# Patient Record
Sex: Female | Born: 1975 | Race: Black or African American | Hispanic: No | Marital: Married | Smoking: Current every day smoker
Health system: Southern US, Community
[De-identification: ages and names within clinical notes are randomized; demographics above are authoritative.]

## PROBLEM LIST (undated history)

## (undated) ENCOUNTER — Inpatient Hospital Stay (HOSPITAL_COMMUNITY): Payer: Self-pay

## (undated) DIAGNOSIS — G5603 Carpal tunnel syndrome, bilateral upper limbs: Secondary | ICD-10-CM

## (undated) DIAGNOSIS — F410 Panic disorder [episodic paroxysmal anxiety] without agoraphobia: Secondary | ICD-10-CM

## (undated) DIAGNOSIS — I10 Essential (primary) hypertension: Secondary | ICD-10-CM

## (undated) DIAGNOSIS — K219 Gastro-esophageal reflux disease without esophagitis: Secondary | ICD-10-CM

## (undated) DIAGNOSIS — R609 Edema, unspecified: Secondary | ICD-10-CM

## (undated) DIAGNOSIS — IMO0001 Reserved for inherently not codable concepts without codable children: Secondary | ICD-10-CM

## (undated) DIAGNOSIS — F32A Depression, unspecified: Secondary | ICD-10-CM

## (undated) DIAGNOSIS — E7800 Pure hypercholesterolemia, unspecified: Secondary | ICD-10-CM

## (undated) DIAGNOSIS — E559 Vitamin D deficiency, unspecified: Secondary | ICD-10-CM

## (undated) DIAGNOSIS — I639 Cerebral infarction, unspecified: Secondary | ICD-10-CM

## (undated) DIAGNOSIS — O24419 Gestational diabetes mellitus in pregnancy, unspecified control: Secondary | ICD-10-CM

## (undated) DIAGNOSIS — N979 Female infertility, unspecified: Secondary | ICD-10-CM

## (undated) DIAGNOSIS — F419 Anxiety disorder, unspecified: Secondary | ICD-10-CM

## (undated) DIAGNOSIS — E739 Lactose intolerance, unspecified: Secondary | ICD-10-CM

## (undated) DIAGNOSIS — K5900 Constipation, unspecified: Secondary | ICD-10-CM

## (undated) DIAGNOSIS — J40 Bronchitis, not specified as acute or chronic: Secondary | ICD-10-CM

## (undated) DIAGNOSIS — R002 Palpitations: Secondary | ICD-10-CM

## (undated) DIAGNOSIS — F329 Major depressive disorder, single episode, unspecified: Secondary | ICD-10-CM

## (undated) DIAGNOSIS — Z8489 Family history of other specified conditions: Secondary | ICD-10-CM

## (undated) DIAGNOSIS — R519 Headache, unspecified: Secondary | ICD-10-CM

## (undated) DIAGNOSIS — O4100X0 Oligohydramnios, unspecified trimester, not applicable or unspecified: Secondary | ICD-10-CM

## (undated) DIAGNOSIS — R51 Headache: Secondary | ICD-10-CM

## (undated) HISTORY — DX: Pure hypercholesterolemia, unspecified: E78.00

## (undated) HISTORY — PX: BREAST BIOPSY: SHX20

## (undated) HISTORY — DX: Edema, unspecified: R60.9

## (undated) HISTORY — DX: Lactose intolerance, unspecified: E73.9

## (undated) HISTORY — DX: Gestational diabetes mellitus in pregnancy, unspecified control: O24.419

## (undated) HISTORY — DX: Constipation, unspecified: K59.00

## (undated) HISTORY — PX: BREAST EXCISIONAL BIOPSY: SUR124

## (undated) HISTORY — DX: Palpitations: R00.2

## (undated) HISTORY — DX: Female infertility, unspecified: N97.9

## (undated) HISTORY — DX: Gastro-esophageal reflux disease without esophagitis: K21.9

## (undated) HISTORY — DX: Vitamin D deficiency, unspecified: E55.9

---

## 2003-05-20 ENCOUNTER — Emergency Department (HOSPITAL_COMMUNITY): Admission: EM | Admit: 2003-05-20 | Discharge: 2003-05-20 | Payer: Self-pay | Admitting: Emergency Medicine

## 2004-02-23 ENCOUNTER — Emergency Department (HOSPITAL_COMMUNITY): Admission: EM | Admit: 2004-02-23 | Discharge: 2004-02-23 | Payer: Self-pay | Admitting: Emergency Medicine

## 2004-10-08 ENCOUNTER — Emergency Department (HOSPITAL_COMMUNITY): Admission: EM | Admit: 2004-10-08 | Discharge: 2004-10-08 | Payer: Self-pay | Admitting: Emergency Medicine

## 2005-01-11 ENCOUNTER — Inpatient Hospital Stay (HOSPITAL_COMMUNITY): Admission: AD | Admit: 2005-01-11 | Discharge: 2005-01-11 | Payer: Self-pay | Admitting: Obstetrics & Gynecology

## 2005-02-05 ENCOUNTER — Inpatient Hospital Stay (HOSPITAL_COMMUNITY): Admission: AD | Admit: 2005-02-05 | Discharge: 2005-02-05 | Payer: Self-pay | Admitting: Obstetrics & Gynecology

## 2005-03-20 ENCOUNTER — Inpatient Hospital Stay (HOSPITAL_COMMUNITY): Admission: AD | Admit: 2005-03-20 | Discharge: 2005-03-20 | Payer: Self-pay | Admitting: Obstetrics

## 2005-03-20 IMAGING — US US FETAL BPP W/O NONSTRESS
1 series · 14 of 25 positions shown · non-contrast
Comparison: none

CLINICAL DATA: 39 weeks pregnant with contractions and variable decelerations.

[Series 1: us fetal bpp w/o nonstress · 0.33mm/px · 25 acquisitions, 14 frames shown]
[im 1/25]
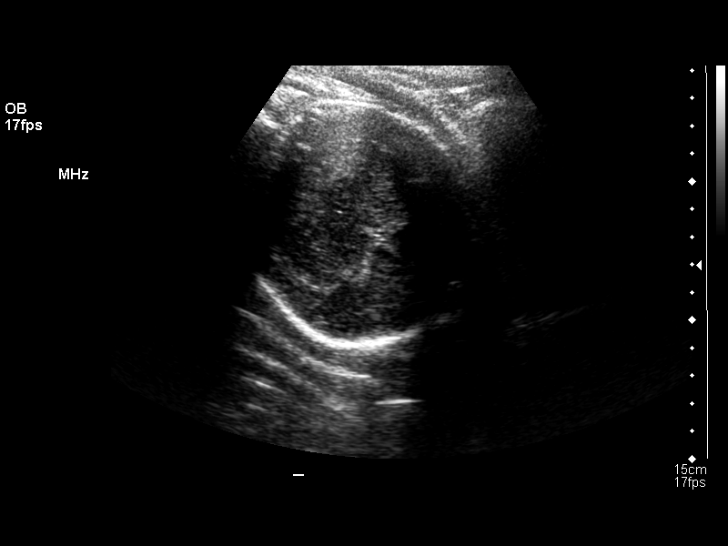
[im 3/25]
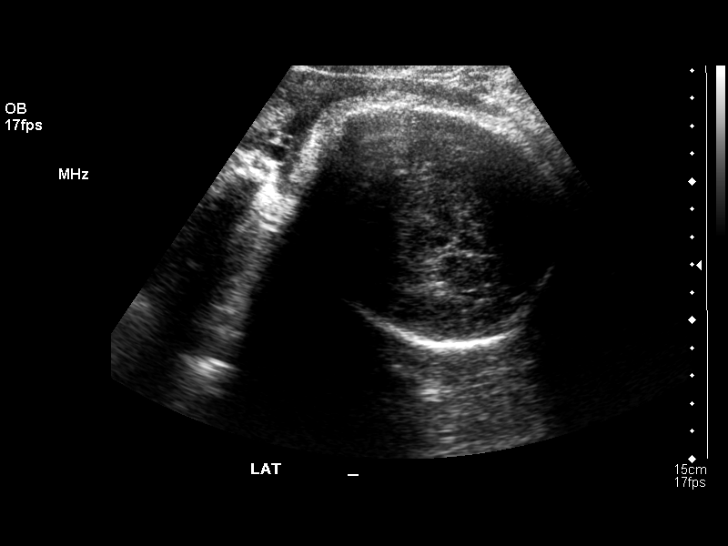
[im 5/25]
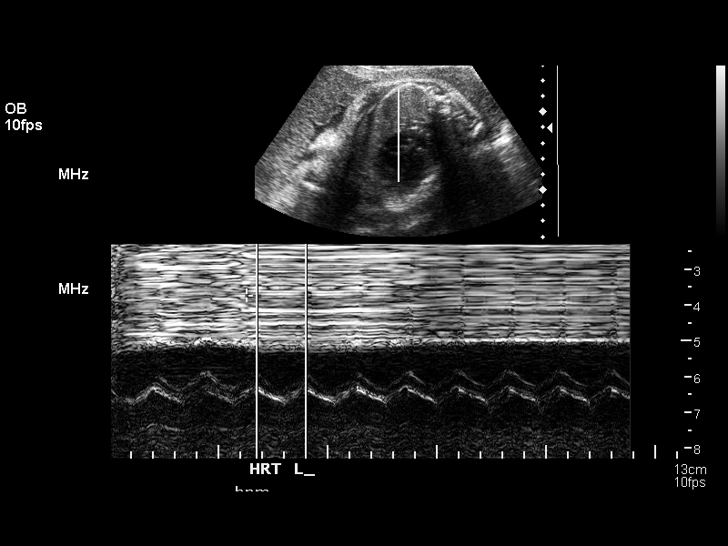
[im 7/25]
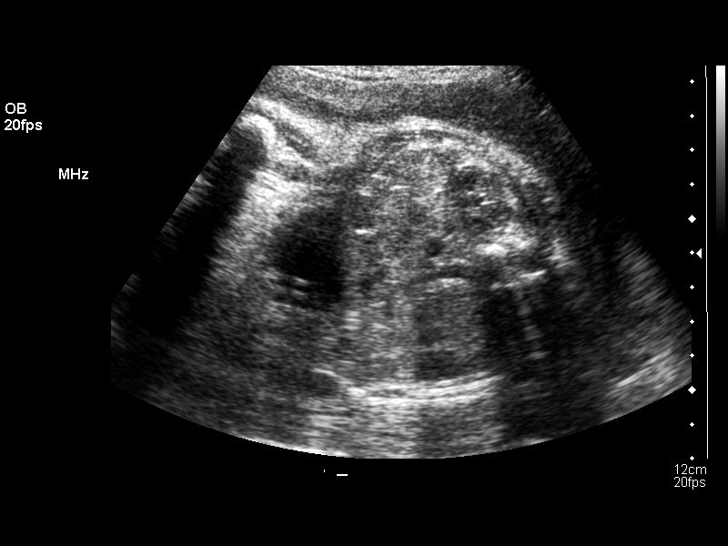
[im 9/25]
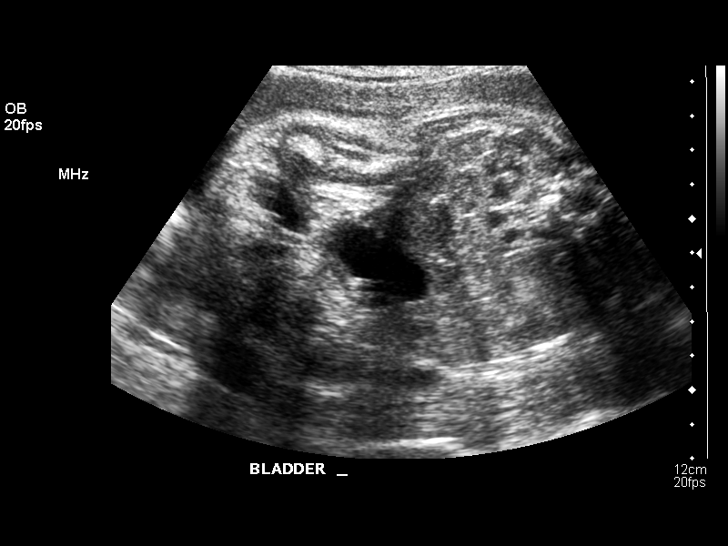
[im 10/25]
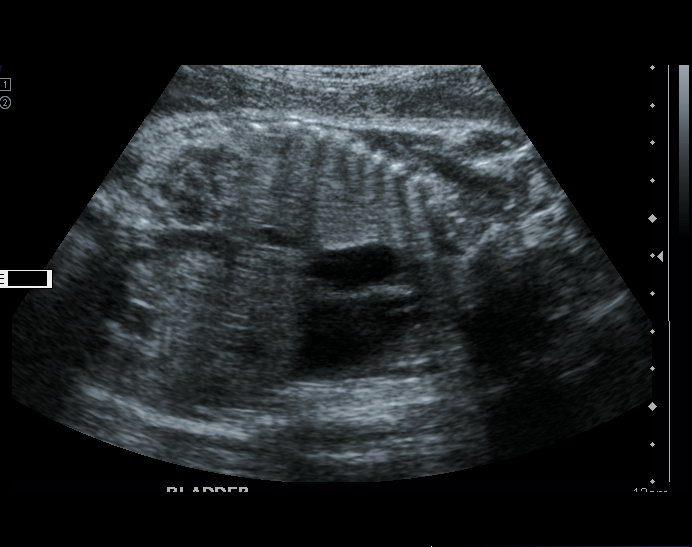
[im 12/25]
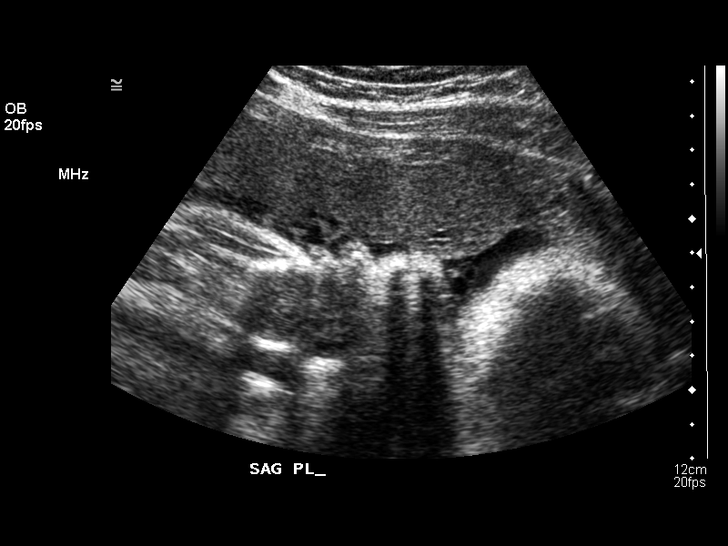
[im 14/25]
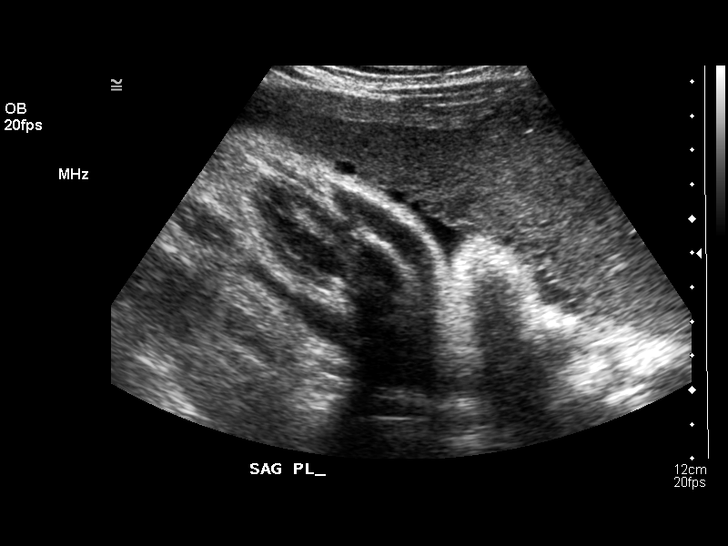
[im 16/25]
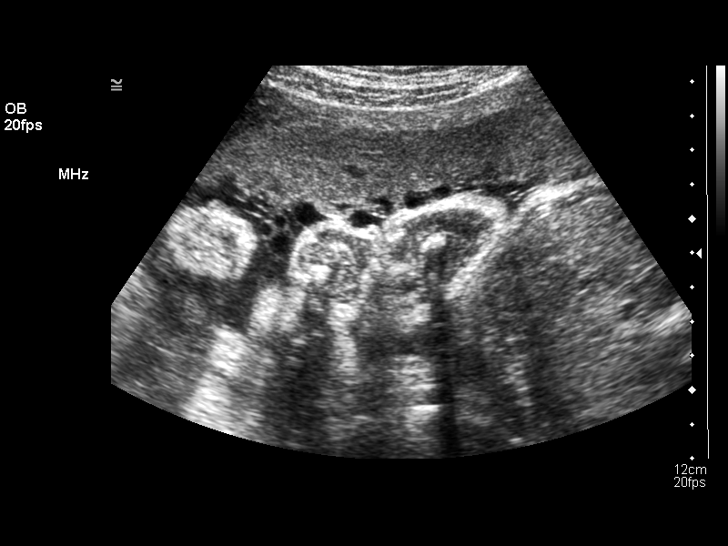
[im 17/25]
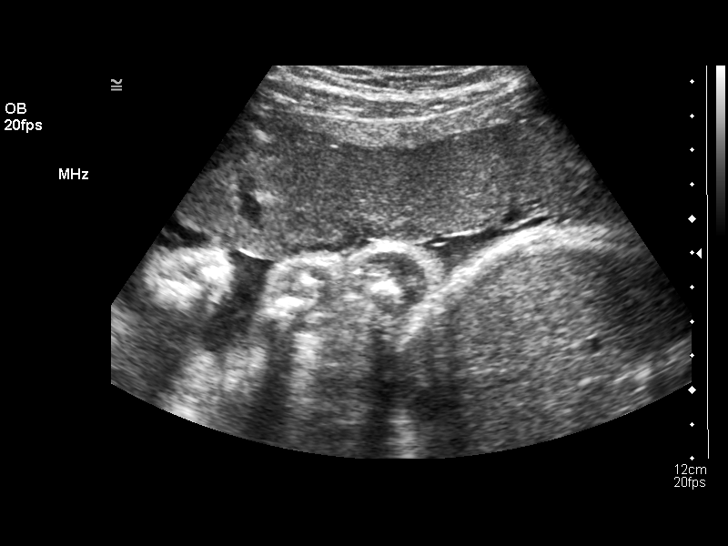
[im 19/25]
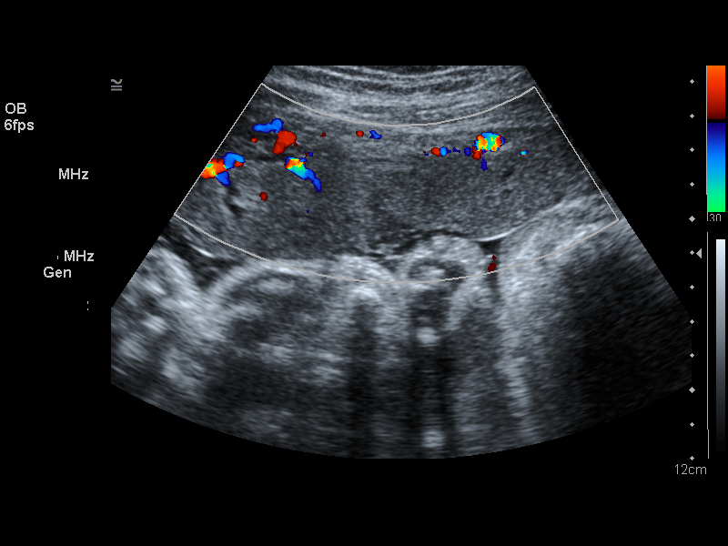
[im 21/25]
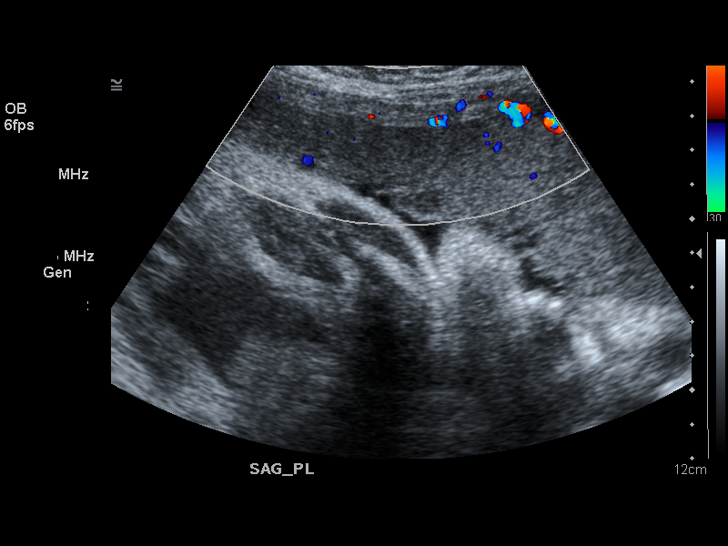
[im 23/25]
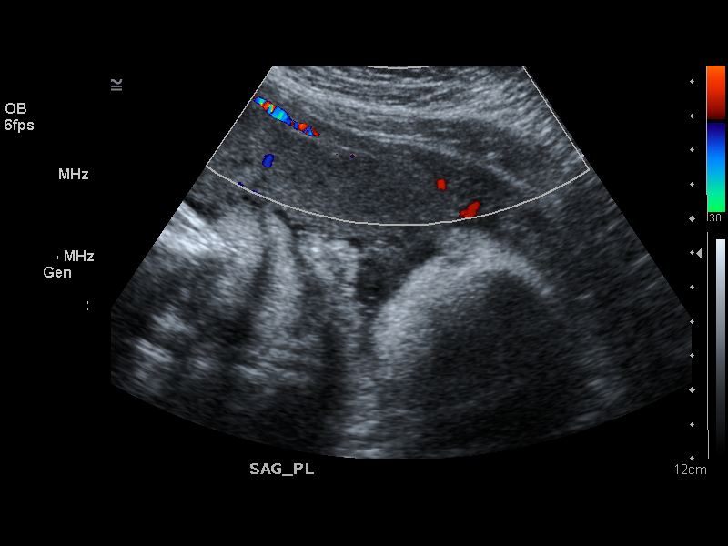
[im 25/25]
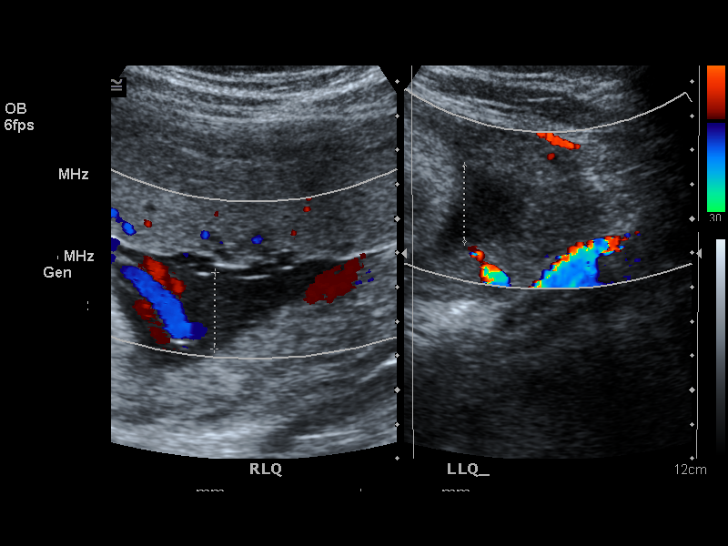

[14 of 25 positions shown; findings below may reference images not displayed]

BIOPHYSICAL PROFILE

 Number of Fetuses:  1
 Heart rate:  133
 Movement:  Yes
 Breathing:  Yes
 Presentation:  Cephalic
 Placental Location:  Anterior
 Grade:  III
 Previa:  No
 Amniotic Fluid (Subjective):  Normal
 Amniotic Fluid (Objective):  8.6 cm AFI (5th -95th%ile = 7.2 ? 22.6 cm for 39 wks)

 Fetal measurements and complete anatomic evaluation were not requested.  The following fetal anatomy was visualized on this exam:  Lateral ventricles, thalami, stomach, kidneys, bladder and diaphragm.  

 BPP SCORING
 Movements:  2  Time:  10 minutes
 Breathing:  2
 Tone:  2
 Amniotic Fluid:  2
 Total Score:  8

 MATERNAL UTERINE AND ADNEXAL FINDINGS
 Cervix:  Not evaluated.
IMPRESSION: 1.  Living intrauterine fetus estimated at 39 weeks gestation.  Fetus is in a cephalic presentation and there is an anterior, grade III placenta.   Normal amniotic fluid volume.
 2.  Biophysical profile score [DATE] at 10 minutes.

## 2005-03-23 ENCOUNTER — Inpatient Hospital Stay (HOSPITAL_COMMUNITY): Admission: AD | Admit: 2005-03-23 | Discharge: 2005-03-25 | Payer: Self-pay | Admitting: Obstetrics & Gynecology

## 2005-03-23 ENCOUNTER — Encounter (INDEPENDENT_AMBULATORY_CARE_PROVIDER_SITE_OTHER): Payer: Self-pay | Admitting: Specialist

## 2005-03-23 DIAGNOSIS — O365990 Maternal care for other known or suspected poor fetal growth, unspecified trimester, not applicable or unspecified: Secondary | ICD-10-CM

## 2005-04-02 ENCOUNTER — Inpatient Hospital Stay (HOSPITAL_COMMUNITY): Admission: AD | Admit: 2005-04-02 | Discharge: 2005-04-03 | Payer: Self-pay | Admitting: Obstetrics & Gynecology

## 2006-10-14 ENCOUNTER — Emergency Department (HOSPITAL_COMMUNITY): Admission: EM | Admit: 2006-10-14 | Discharge: 2006-10-14 | Payer: Self-pay | Admitting: Emergency Medicine

## 2007-02-08 ENCOUNTER — Emergency Department (HOSPITAL_COMMUNITY): Admission: EM | Admit: 2007-02-08 | Discharge: 2007-02-08 | Payer: Self-pay | Admitting: Emergency Medicine

## 2007-02-08 IMAGING — CR DG CHEST 2V
2 series · 2 of 2 positions shown · non-contrast
Comparison: None available.

CLINICAL DATA: Chest congestion.  Bronchitis. 
 CHEST - 2 VIEW:

[w chest pa]
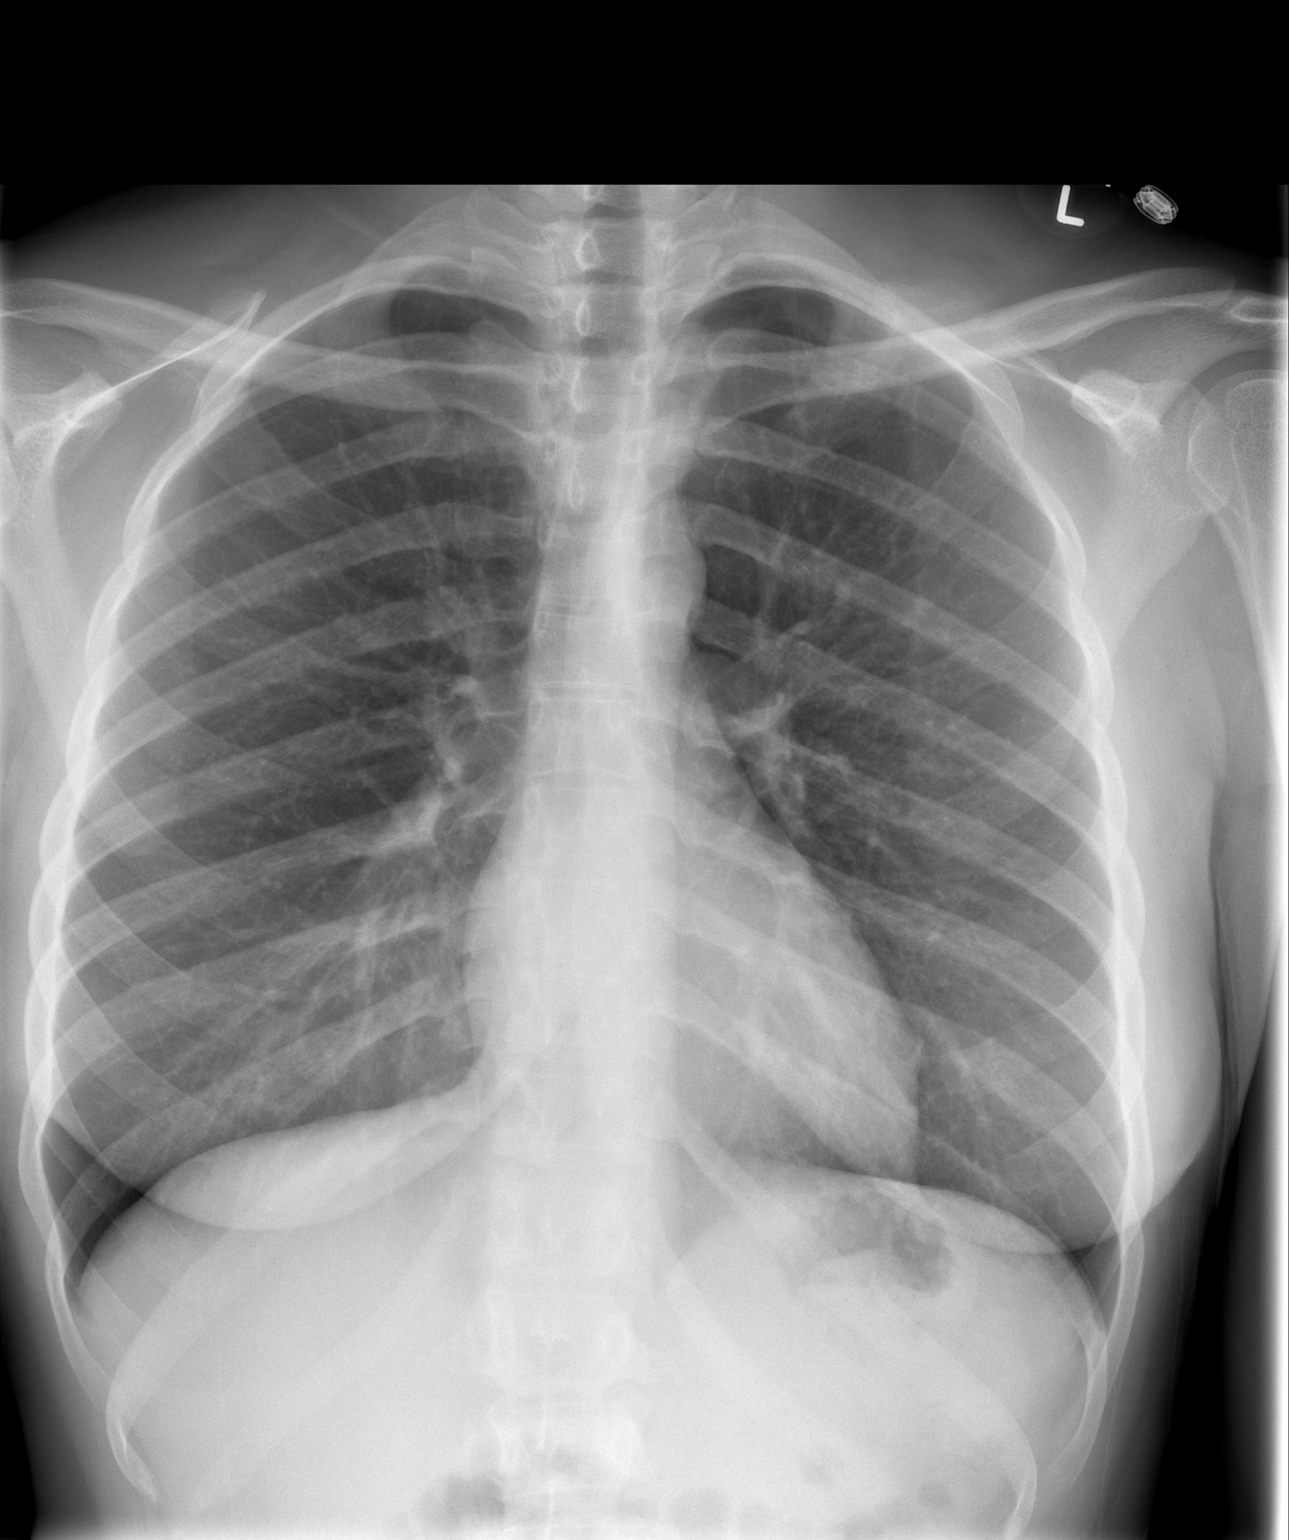

[w chest lat]
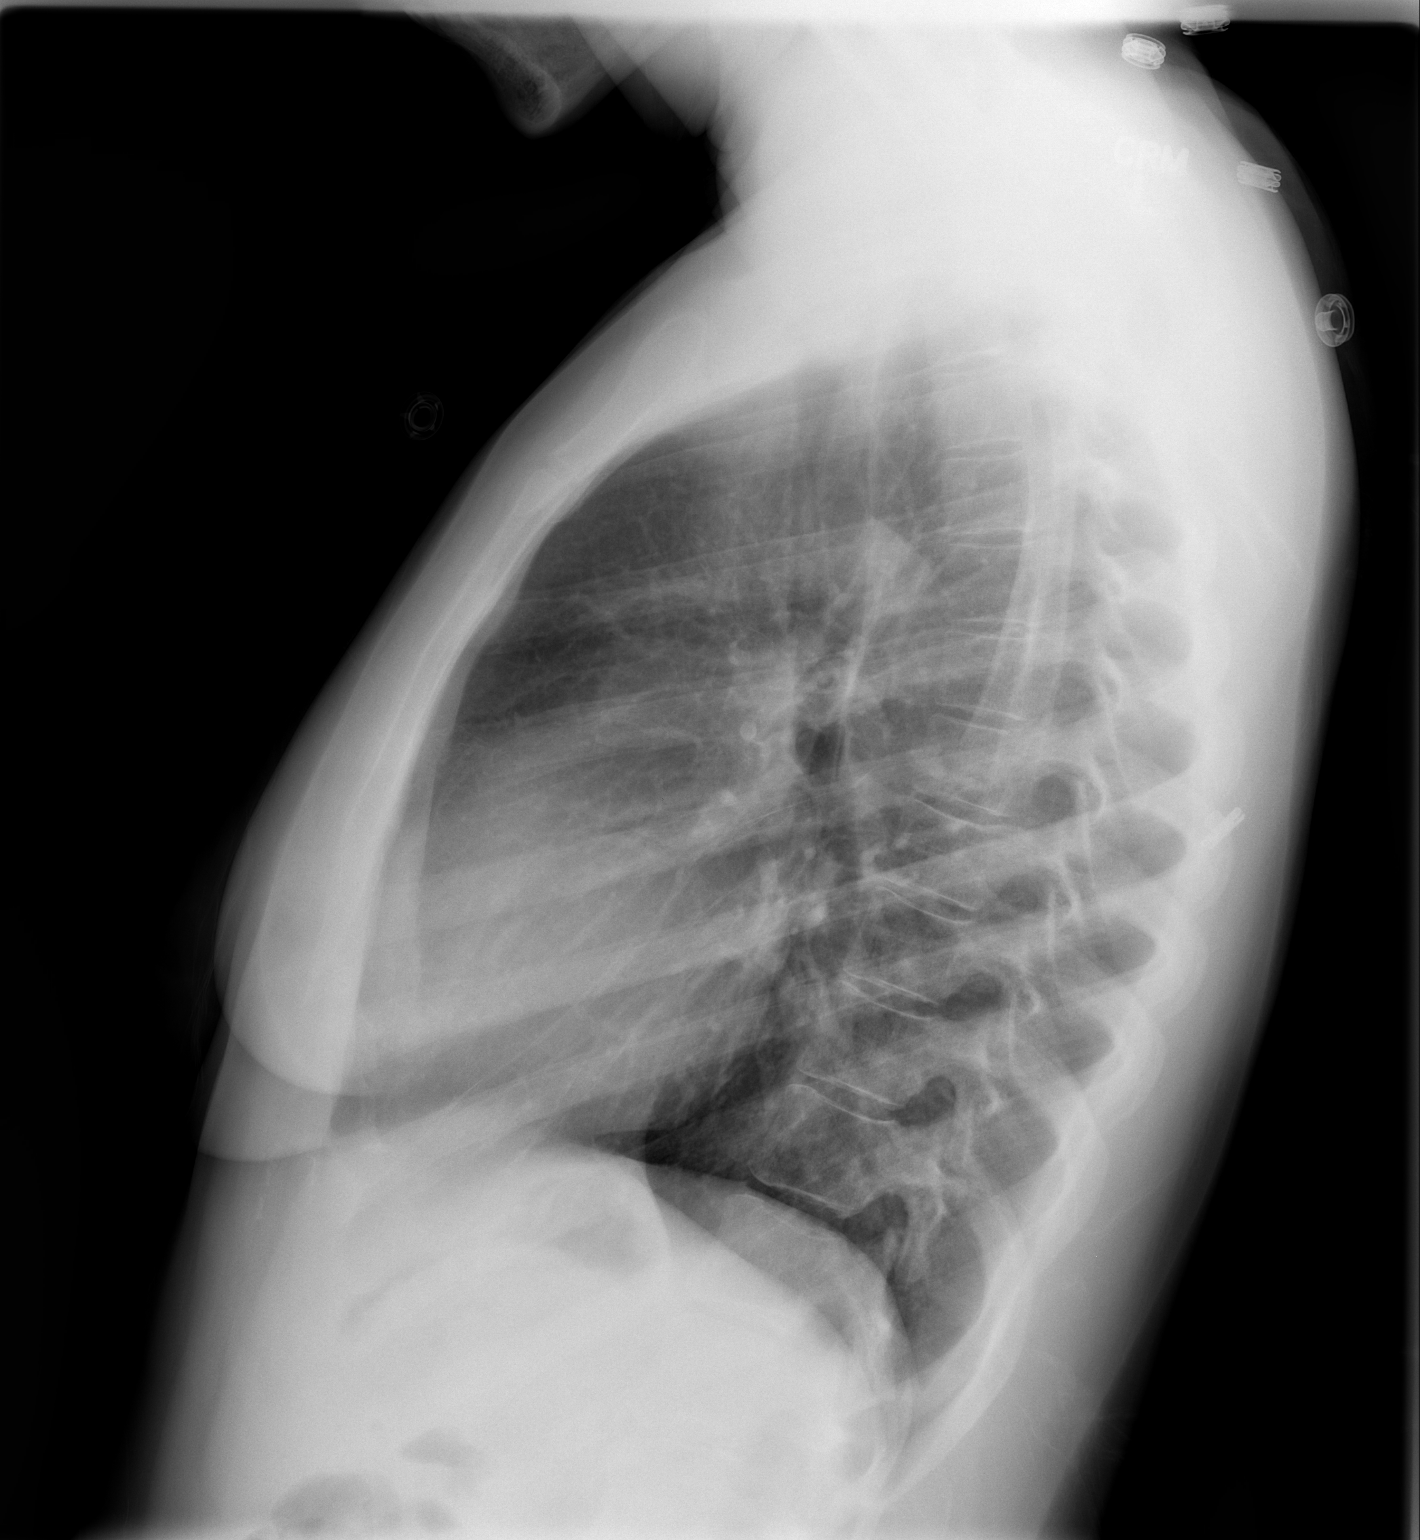

[2 of 2 positions shown; findings below may reference images not displayed]

FINDINGS: The heart size and mediastinal contours are within normal limits.  Both lungs are clear.  The visualized skeletal structures are unremarkable.
IMPRESSION: No active cardiopulmonary disease.

## 2008-07-04 ENCOUNTER — Emergency Department (HOSPITAL_COMMUNITY): Admission: EM | Admit: 2008-07-04 | Discharge: 2008-07-04 | Payer: Self-pay | Admitting: Emergency Medicine

## 2008-07-04 IMAGING — CR DG KNEE COMPLETE 4+V*R*
4 series · 4 of 4 positions shown · non-contrast
Comparison: None

CLINICAL DATA: Right knee pain.

RIGHT KNEE - COMPLETE 4+ VIEW

[t knee ap right]
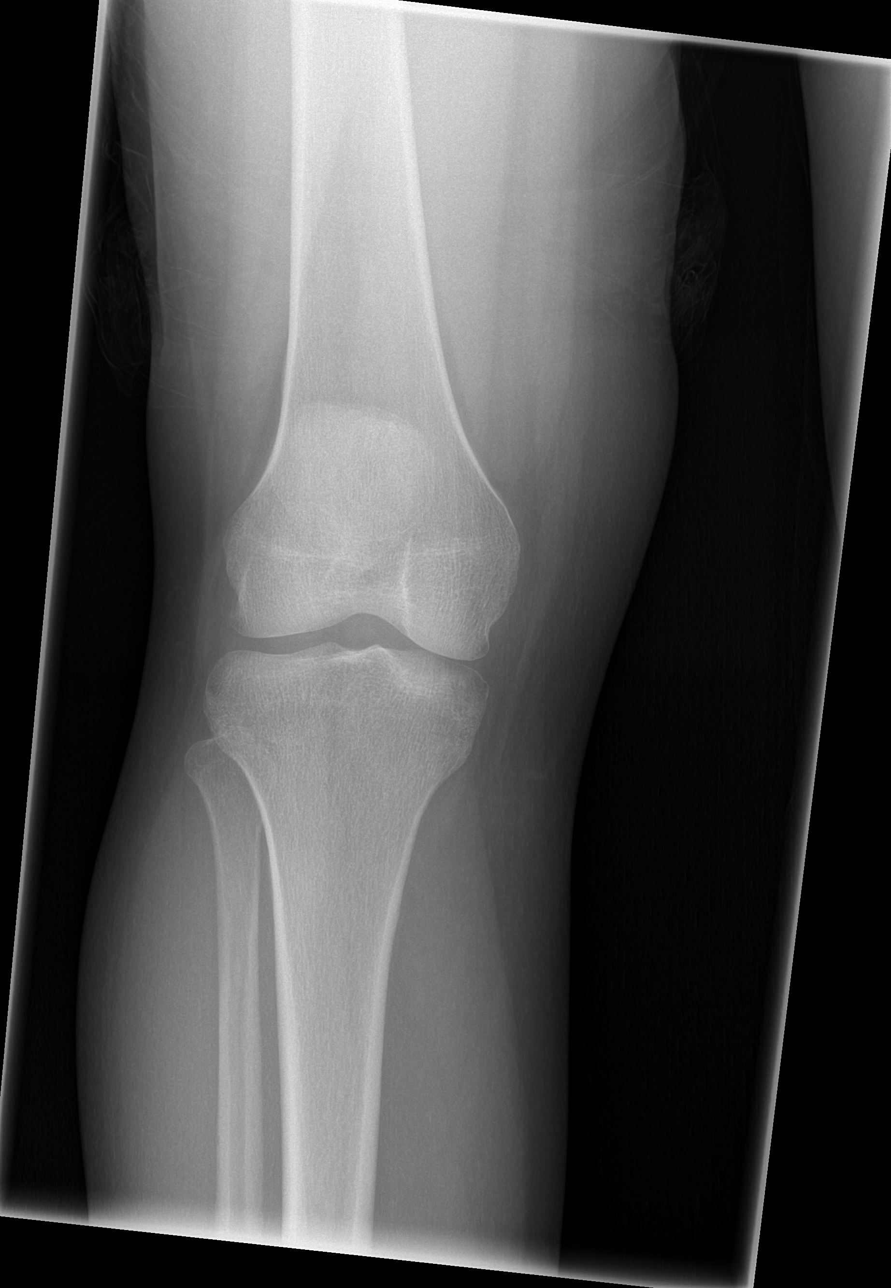

[t knee oblique right (1 of 2)]
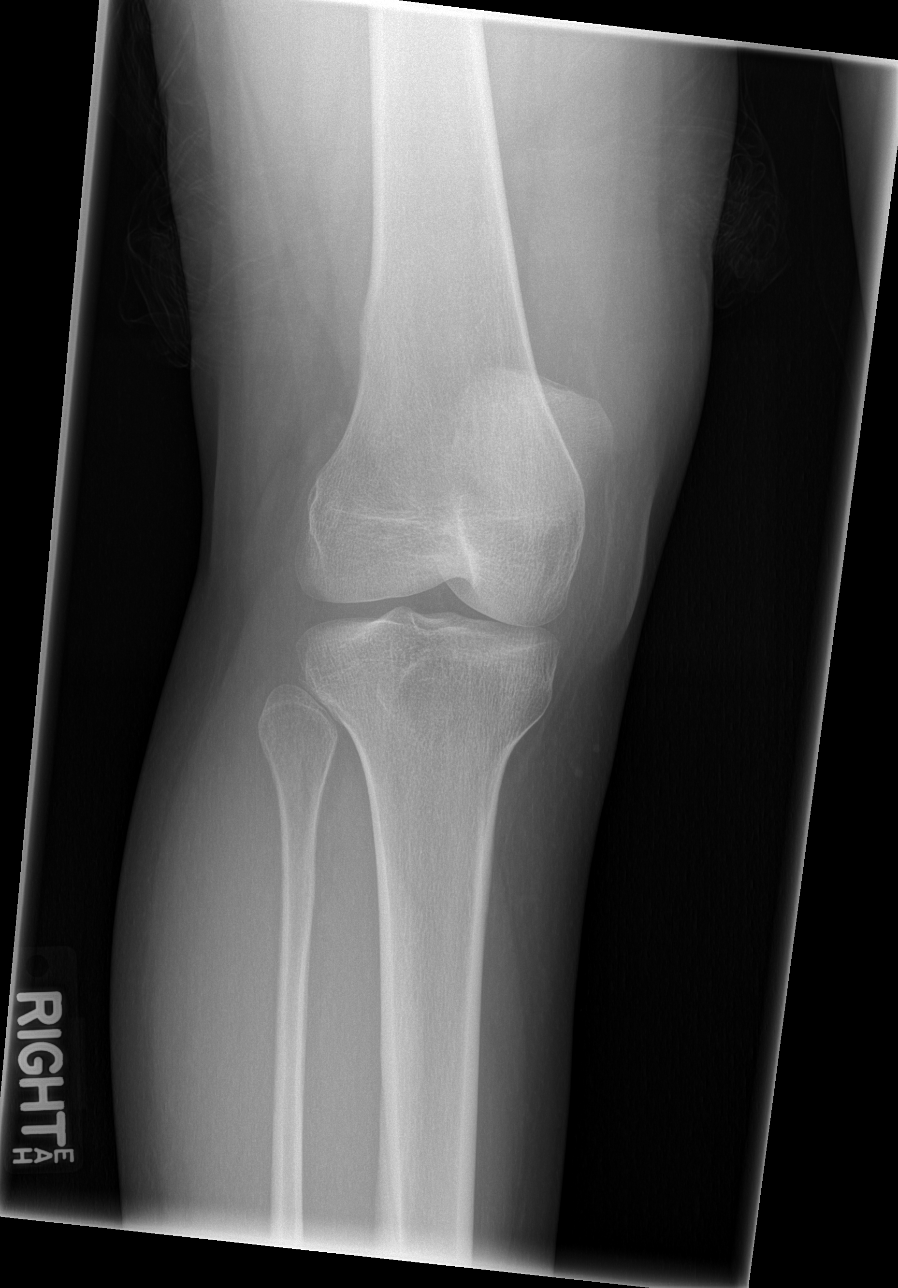

[t knee oblique right (2 of 2)]
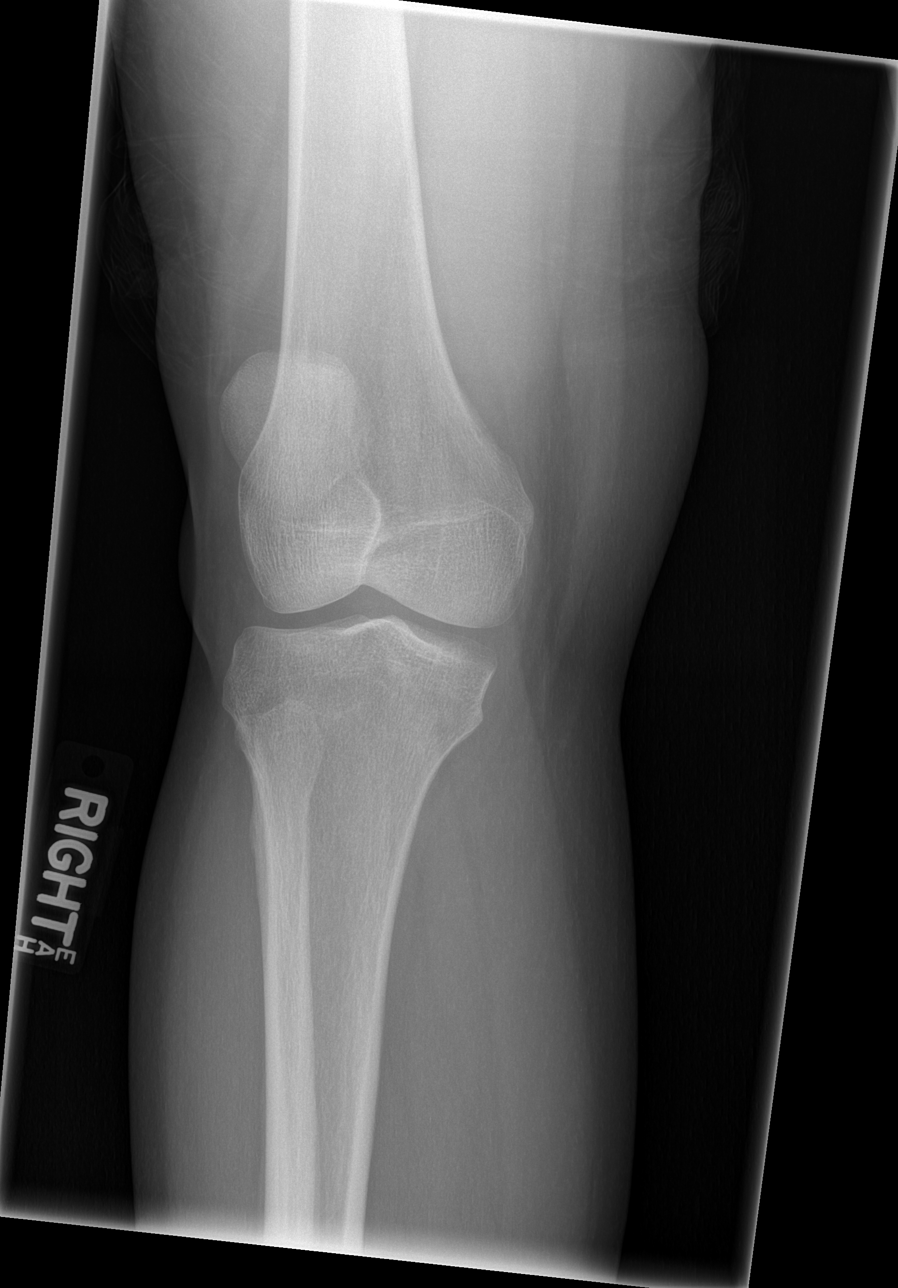

[t knee lat right]
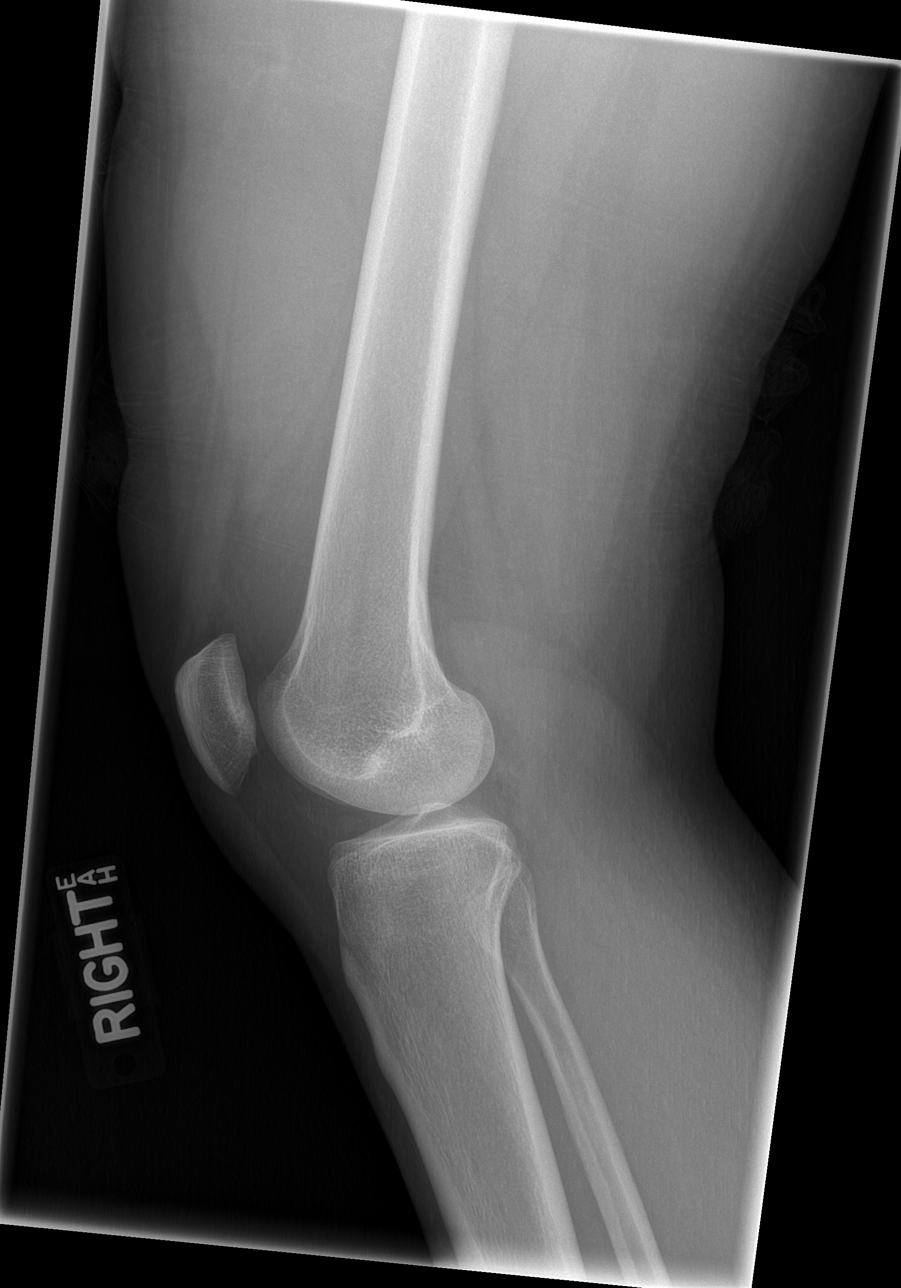

[4 of 4 positions shown; findings below may reference images not displayed]

FINDINGS: No evidence of acute fracture, subluxation or dislocation
identified.

No joint effusion noted.

No radio-opaque foreign bodies are present.

No focal bony lesions are noted.
IMPRESSION: No evidence of acute abnormality.

## 2008-07-23 ENCOUNTER — Encounter: Admission: RE | Admit: 2008-07-23 | Discharge: 2008-07-23 | Payer: Self-pay | Admitting: Orthopedic Surgery

## 2008-07-23 IMAGING — US US EXTREM LOW VENOUS BILAT
1 series · 14 of 24 positions shown · non-contrast
Comparison: None

CLINICAL DATA: Bilateral leg pain and swelling

BILATERAL LOWER EXTREMITY VENOUS DUPLEX ULTRASOUND
TECHNIQUE: Gray-scale sonography with graded compression, as well
as color Doppler and duplex ultrasound, were performed to evaluate
the deep venous system of both lower extremities from the level of
the common femoral vein through the popliteal and proximal calf
veins. Spectral Doppler was utilized to evaluate flow at rest and
with distal augmentation maneuvers.

[Series 1: us extrem low venous bilat · 14 of 57 slices shown]
[im 1/57]
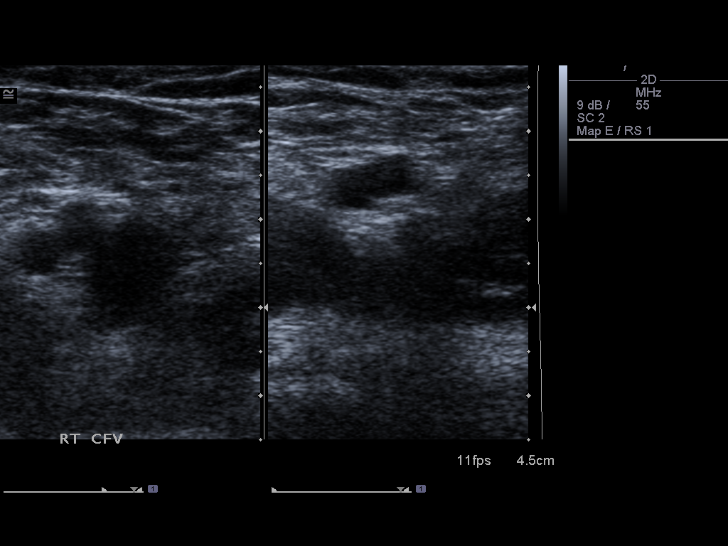
[im 5/57]
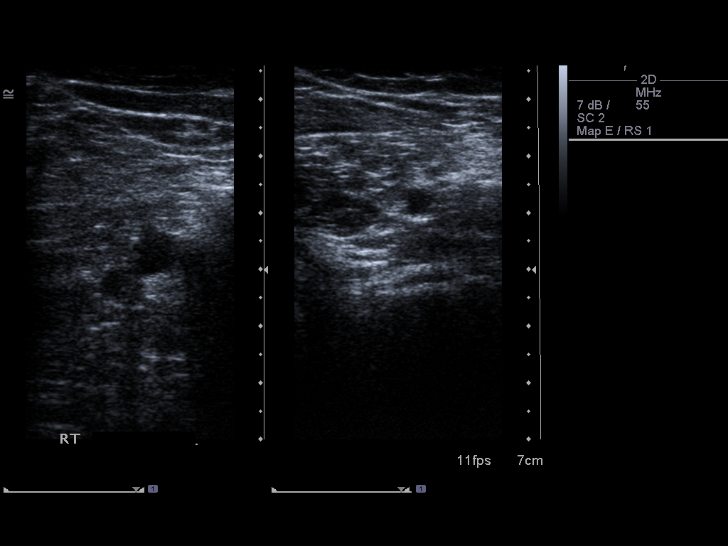
[im 10/57]
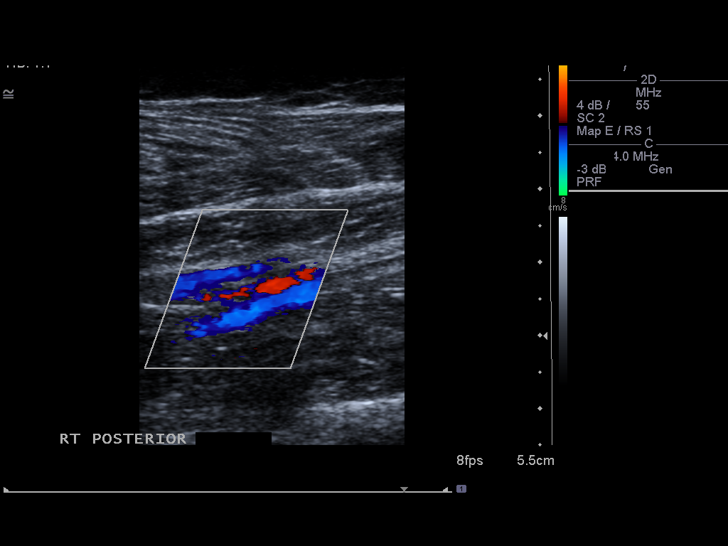
[im 15/57]
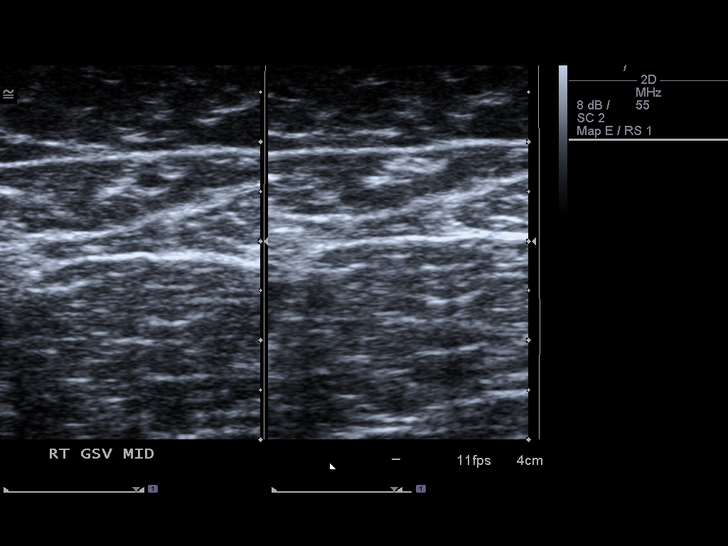
[im 18/57]
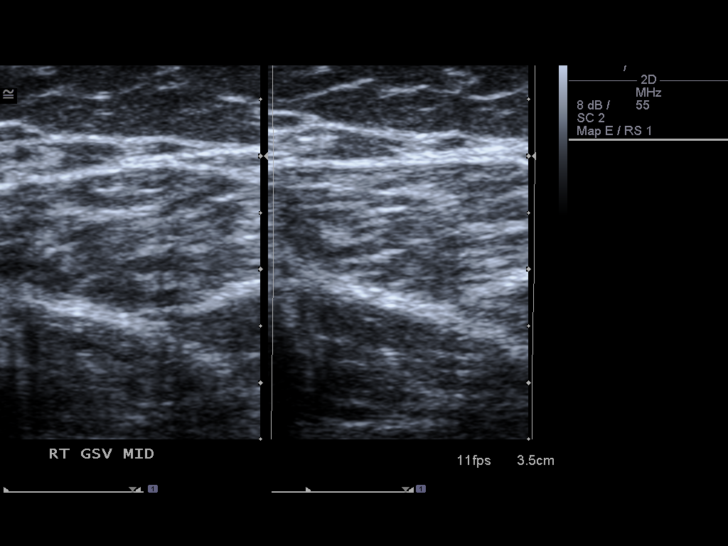
[im 22/57]
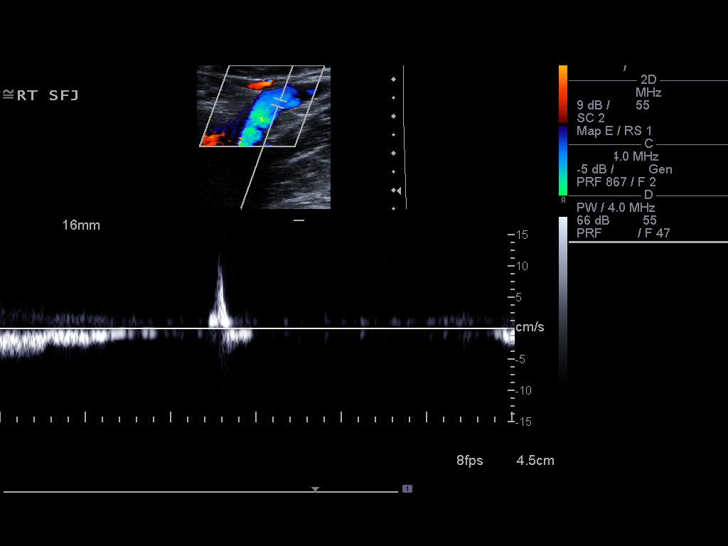
[im 27/57]
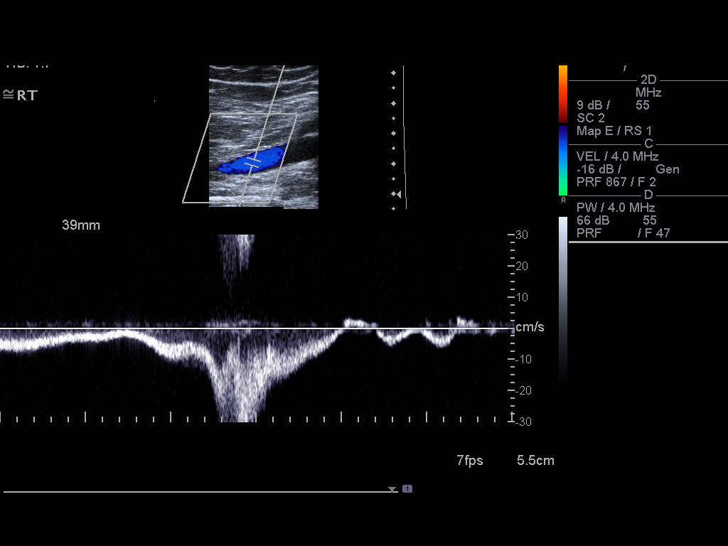
[im 30/57]
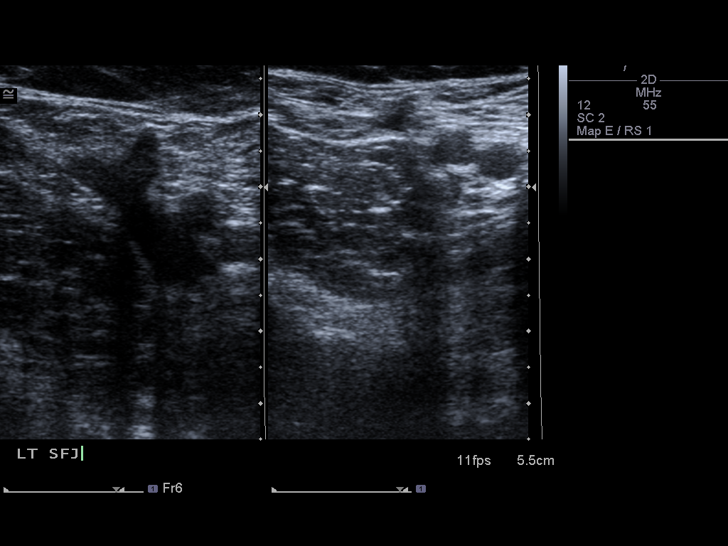
[im 35/57]
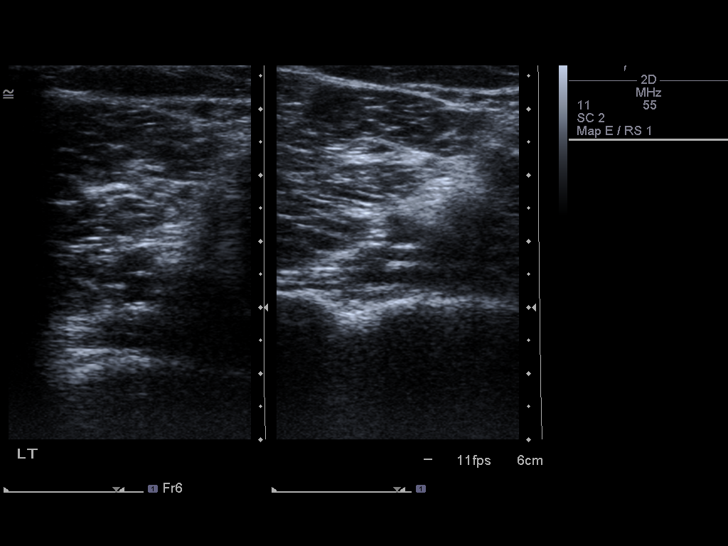
[im 39/57]
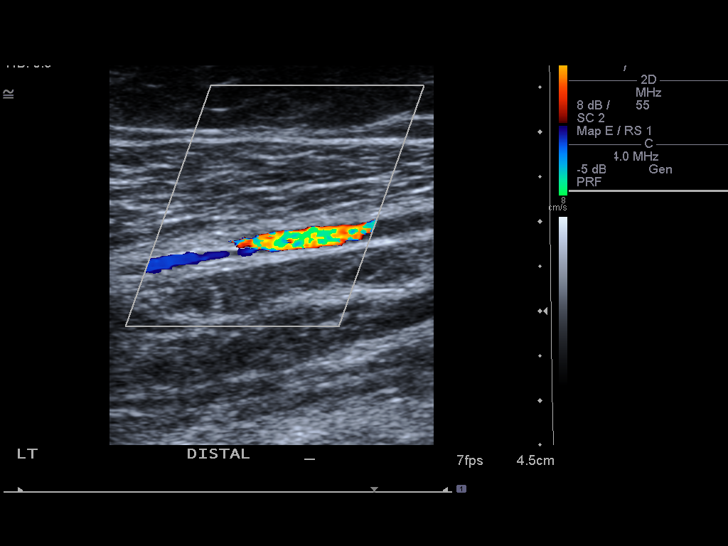
[im 44/57]
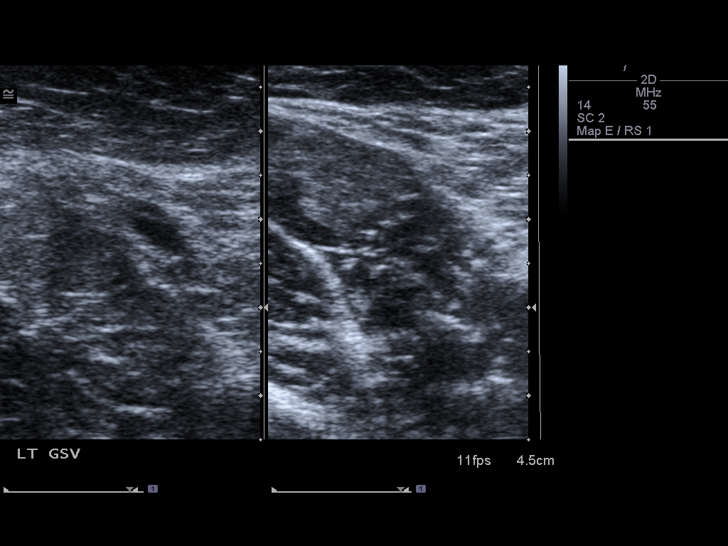
[im 47/57]
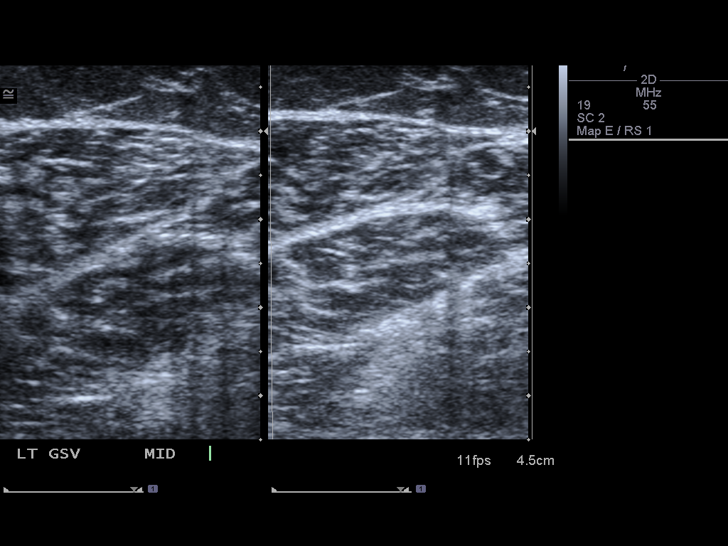
[im 52/57]
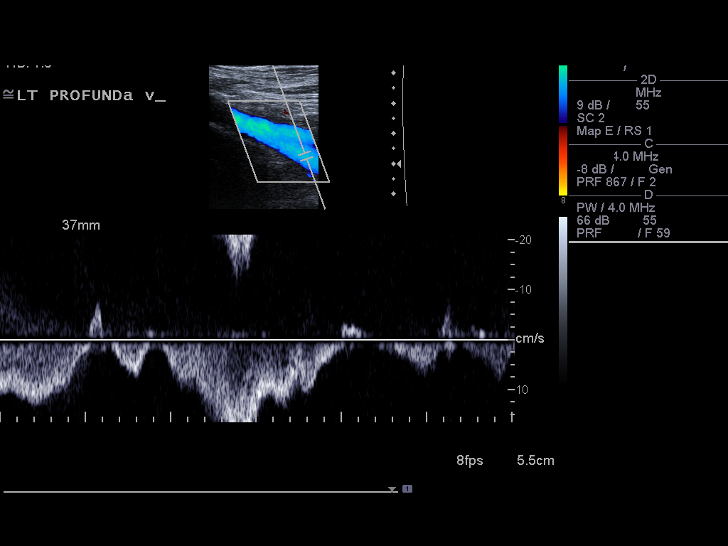
[im 57/57]
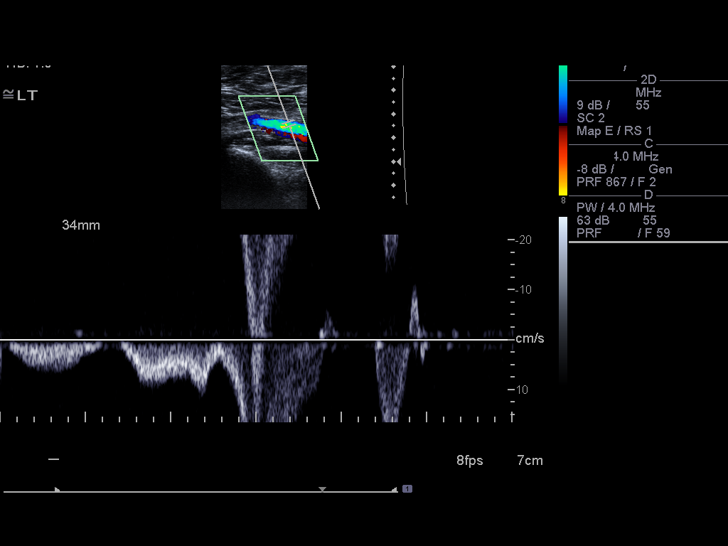

[14 of 24 positions shown; findings below may reference images not displayed]

FINDINGS: Normal compressibility of  bilateral common femoral,
superficial femoral, and popliteal veins is demonstrated, as well
as the visualized proximal calf veins.  No filling defects to
suggest DVT on grayscale or color Doppler imaging.  Doppler
waveforms show normal direction of venous flow, normal respiratory
phasicity and response to augmentation.
IMPRESSION: No evidence of bilateral lower extremity deep vein thrombosis.

## 2010-09-23 ENCOUNTER — Inpatient Hospital Stay (INDEPENDENT_AMBULATORY_CARE_PROVIDER_SITE_OTHER)
Admission: RE | Admit: 2010-09-23 | Discharge: 2010-09-23 | Disposition: A | Discharge status: Home / Self Care | Payer: Medicaid Other | Source: Ambulatory Visit | Attending: Emergency Medicine | Admitting: Emergency Medicine

## 2010-09-23 DIAGNOSIS — N760 Acute vaginitis: Secondary | ICD-10-CM

## 2010-09-23 LAB — WET PREP, GENITAL

## 2010-09-23 LAB — POCT URINALYSIS DIP (DEVICE)
Bilirubin Urine: NEGATIVE
Nitrite: NEGATIVE
pH: 8.5 — ABNORMAL HIGH (ref 5.0–8.0)

## 2010-09-23 LAB — POCT PREGNANCY, URINE: Preg Test, Ur: NEGATIVE

## 2010-09-24 LAB — GC/CHLAMYDIA PROBE AMP, GENITAL: GC Probe Amp, Genital: NEGATIVE

## 2013-11-11 ENCOUNTER — Encounter (HOSPITAL_COMMUNITY): Payer: Self-pay | Admitting: Emergency Medicine

## 2013-11-11 ENCOUNTER — Emergency Department (HOSPITAL_COMMUNITY)
Admission: EM | Admit: 2013-11-11 | Discharge: 2013-11-11 | Disposition: A | Discharge status: Home / Self Care | Payer: Medicaid Other | Attending: Emergency Medicine | Admitting: Emergency Medicine

## 2013-11-11 DIAGNOSIS — L819 Disorder of pigmentation, unspecified: Secondary | ICD-10-CM | POA: Insufficient documentation

## 2013-11-11 DIAGNOSIS — F17200 Nicotine dependence, unspecified, uncomplicated: Secondary | ICD-10-CM | POA: Insufficient documentation

## 2013-11-11 DIAGNOSIS — R21 Rash and other nonspecific skin eruption: Secondary | ICD-10-CM | POA: Insufficient documentation

## 2013-11-11 DIAGNOSIS — T498X5A Adverse effect of other topical agents, initial encounter: Secondary | ICD-10-CM | POA: Insufficient documentation

## 2013-11-11 DIAGNOSIS — T7840XA Allergy, unspecified, initial encounter: Secondary | ICD-10-CM

## 2013-11-11 NOTE — ED Notes (Signed)
States she noticed a very small discolored area left elbow. No history of injury. Denies pain.

## 2013-11-11 NOTE — Discharge Instructions (Signed)
Anaphylactic Reaction [name], you were seen today after using lotion and having an allergic reaction to it.  Your symptoms were relieved with benadryl.  Continue to take this medication at home every 4 hours for the next 24 hours.  Then as needed after that.  Follow up with your regular doctor within 3 days for continued care.  If your symptoms worsen or you develop shortness of breath, return to the ED immediately for repeat evaluation.  Thank you. An anaphylactic reaction is a sudden, severe allergic reaction. It affects the whole body. It can be life threatening. You may need to stay in the hospital.  [place] a medical bracelet or necklace that lists your allergy.  Carry your allergy kit or medicine shot to treat severe allergic reactions with you. These can save your life.  Do not drive until medicine from your shot has worn off, unless your doctor says it is okay.  If you have hives or a rash:  Take medicine as told by your doctor.  You may take over-the-counter antihistamine medicine.  Place cold cloths on your skin. Take baths in cool water. Avoid hot baths and hot showers. GET HELP RIGHT AWAY IF:   Your mouth is puffy (swollen), or you have trouble breathing.  You start making whistling sounds when you breathe (wheezing).  You have a tight feeling in your chest or throat.  You have a rash, hives, puffiness, or itching on your body.  You throw up (vomit) or have watery poop (diarrhea).  You feel dizzy or pass out (faint).  You think you are having an allergic reaction.  You have new symptoms. This is an emergency. Use your medicine shot or allergy kit as told. Call your local emergency services (911 in [place] Even if you feel better after the shot, you need to go to the hospital emergency department. MAKE SURE YOU:   Understand these instructions.  Will watch your condition.  Will get help right away if you are not doing well or get worse. Document  Released: 08/23/2007 Document Revised: 09/05/2011 Document Reviewed: 06/07/2011 [facility] Patient Information 2015 [place], [place]. This information is not intended to replace advice given to you by your health care provider. Make sure you discuss any questions you have with your health care provider.

## 2013-11-11 NOTE — ED Notes (Signed)
Patient here with concern about a pair of discolored areas above and below left elbow. Denies pain, injury, rash, exposure, and any other discolored areas. States that she looked at Internet and became worried about possible causes.

## 2013-11-11 NOTE — ED Provider Notes (Signed)
CSN: [id number]     Arrival date & time 11/11/13  0242 History   First MD Initiated Contact with Patient 11/11/13 [phone]     Chief Complaint  Patient presents with  . Skin Discoloration     (Consider location/radiation/quality/duration/timing/severity/associated sxs/prior Treatment) HPI [name] is a 38yo female with no sig PMH, here for concerns of discoloration of her L elbow.  Paient's husband first noticed this this evening and thought it may be a burn.  Patient got on the internet and was concerned about the possibilities. She also notes using her husbands lotion tonight and have pruritis where it was applied, BUE and BLE.  She took a benadryl which resolved her symptoms.  She states a small rash appeared on her BUE at that time, but it was different from the discoloration on her elbow.  She denies any fevers, recent infections, chest pain, shortness of breath, throat swelling, GI or GU complaints.     History reviewed. No pertinent past medical history. History reviewed. No pertinent past surgical history. History reviewed. No pertinent family history. History  Substance Use Topics  . Smoking status: Current Every Day Smoker -- 0.25 packs/day    Types: Cigarettes  . Smokeless tobacco: Not on file  . Alcohol Use: No   OB History   Grav Para Term Preterm Abortions TAB SAB Ect Mult Living                 Review of Systems 10 Systems reviewed and are negative for acute change except as noted in the HPI.    Allergies  Review of patient's allergies indicates no known allergies.  Home Medications   Prior to Admission medications   Not on File   BP 124/80  Pulse 85  Temp(Src) 97.8 F (36.6 C) (Oral)  Resp 16  Ht  (1.676 m)  Wt 180 lb (81.647 kg)  BMI 29.07 kg/m2  SpO2 100%  LMP 11/04/2013 Physical Exam  Nursing note and vitals reviewed. Constitutional: She is oriented to person, place, and time. She appears well-developed and well-nourished. No distress.  HENT:   Head: Normocephalic and atraumatic.  Nose: Nose normal.  Mouth/Throat: Oropharynx is clear and moist. No oropharyngeal exudate.  Eyes: Conjunctivae and EOM are normal. Pupils are equal, round, and reactive to light. No scleral icterus.  Neck: Normal range of motion. Neck supple. No JVD present. No tracheal deviation present. No thyromegaly present.  Cardiovascular: Normal rate, regular rhythm and normal heart sounds.  Exam reveals no gallop and no friction rub.   No murmur heard. Pulmonary/Chest: Effort normal and breath sounds normal. No respiratory distress. She has no wheezes. She exhibits no tenderness.  Abdominal: Soft. Bowel sounds are normal. She exhibits no distension and no mass. There is no tenderness. There is no rebound and no guarding.  Musculoskeletal: Normal range of motion. She exhibits no edema and no tenderness.  Lymphadenopathy:    She has no cervical adenopathy.  Neurological: She is alert and oriented to person, place, and time.  Skin: Skin is warm and dry. She is not diaphoretic. No erythema. No pallor.  L posterior elbow has a 1x3 cm, oblong shaped area of increased pigmentation.  There is no erythema, cellulitis, drainage, prulence noted.  It is non-tender and non-pruritic.  The rash is flat.  Rash on BUE is diffuse, closely pigmented to her skin, non-palpable.   There is no erythema, cellulitis, drainage, prulence noted.  It is non-tender    ED Course  Procedures (  including critical care time) Labs Review Labs Reviewed - No data to display  Imaging Review No results found.   EKG Interpretation None      MDM   Final diagnoses:  Allergic reaction, initial encounter    Patient comes to the ED for concern of her rash.  Her elbow appears to be most like a birth mark or an old burn.  She states it is possible that she never noticed it prior.  The patient appears well and non-toxic.  The rash does not seem to bother her at all.  She was instructed to leave it  alone and not be concerned.  The questionable allergic reaction, the patient was advised to stop using her husbands lotion and continue the benadryl every 4 hours for the next 24hours, then as needed after that.  She was encourage to follow up with a PCP within 3 days for continued care.  Patient demonstrated understanding of this plan.  Vital signs remain stable, patient is safe for discharge.   Tomasita Crumble, MD 11/11/13 [phone]

## 2014-06-24 ENCOUNTER — Encounter: Payer: Self-pay | Admitting: *Deleted

## 2014-08-10 ENCOUNTER — Encounter: Payer: Medicaid Other | Admitting: Family Medicine

## 2014-11-12 ENCOUNTER — Emergency Department (HOSPITAL_COMMUNITY): Payer: Medicaid Other

## 2014-11-12 ENCOUNTER — Encounter (HOSPITAL_COMMUNITY): Payer: Self-pay | Admitting: Emergency Medicine

## 2014-11-12 ENCOUNTER — Emergency Department (HOSPITAL_COMMUNITY)
Admission: EM | Admit: 2014-11-12 | Discharge: 2014-11-13 | Disposition: A | Discharge status: Home / Self Care | Payer: Medicaid Other | Attending: Emergency Medicine | Admitting: Emergency Medicine

## 2014-11-12 DIAGNOSIS — Z79899 Other long term (current) drug therapy: Secondary | ICD-10-CM | POA: Insufficient documentation

## 2014-11-12 DIAGNOSIS — E876 Hypokalemia: Secondary | ICD-10-CM

## 2014-11-12 DIAGNOSIS — R0789 Other chest pain: Secondary | ICD-10-CM

## 2014-11-12 DIAGNOSIS — Z720 Tobacco use: Secondary | ICD-10-CM | POA: Insufficient documentation

## 2014-11-12 LAB — BASIC METABOLIC PANEL
Anion gap: 8 (ref 5–15)
BUN: 7 mg/dL (ref 6–20)
CO2: 24 mmol/L (ref 22–32)
Calcium: 8.9 mg/dL (ref 8.9–10.3)
Chloride: 105 mmol/L (ref 101–111)
Creatinine, Ser: 1.05 mg/dL — ABNORMAL HIGH (ref 0.44–1.00)
GFR calc Af Amer: >60 mL/min (ref >60)
GFR calc non Af Amer: >60 mL/min (ref >60)
Glucose, Bld: 95 mg/dL (ref 65–99)
Potassium: 3.2 mmol/L — ABNORMAL LOW (ref 3.5–5.1)
Sodium: 137 mmol/L (ref 135–145)

## 2014-11-12 LAB — CBC
HCT: 38.8 % (ref 36.0–46.0)
Hemoglobin: 13.1 g/dL (ref 12.0–15.0)
MCH: 29 pg (ref 26.0–34.0)
MCHC: 33.8 g/dL (ref 30.0–36.0)
MCV: 86 fL (ref 78.0–100.0)
Platelets: 272 10*3/uL (ref 150–400)
RBC: 4.51 MIL/uL (ref 3.87–5.11)
RDW: 13.8 % (ref 11.5–15.5)
WBC: 9.1 10*3/uL (ref 4.0–10.5)

## 2014-11-12 LAB — I-STAT TROPONIN, ED: Troponin i, poc: 0 ng/mL (ref 0.00–0.08)

## 2014-11-12 IMAGING — DX DG CHEST 2V
2 series · 2 of 2 positions shown · non-contrast
Comparison: 02/08/2007

CLINICAL DATA: Chest pain for 2 days.  Smoker.

EXAM:
CHEST  2 VIEW

[chest pa]
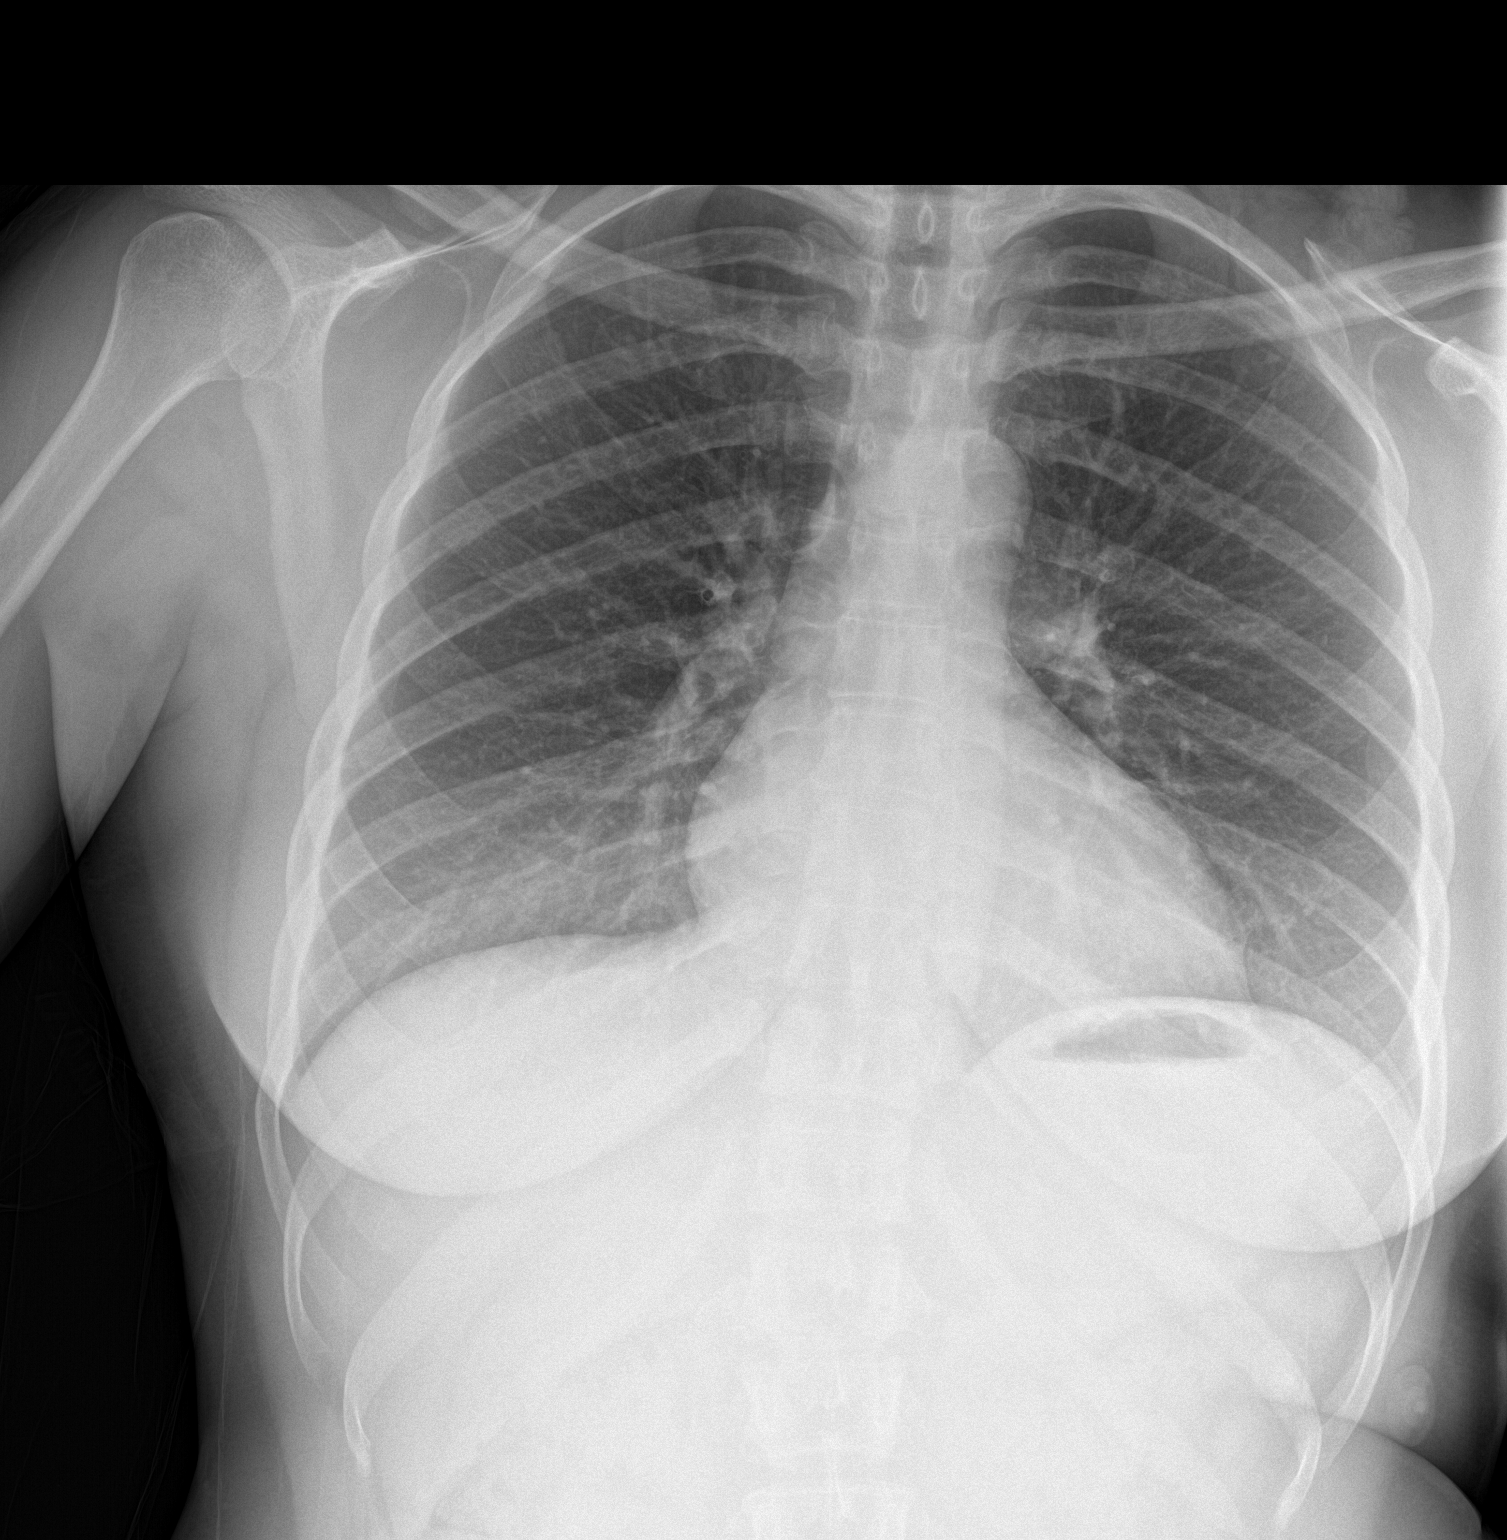

[chest lat]
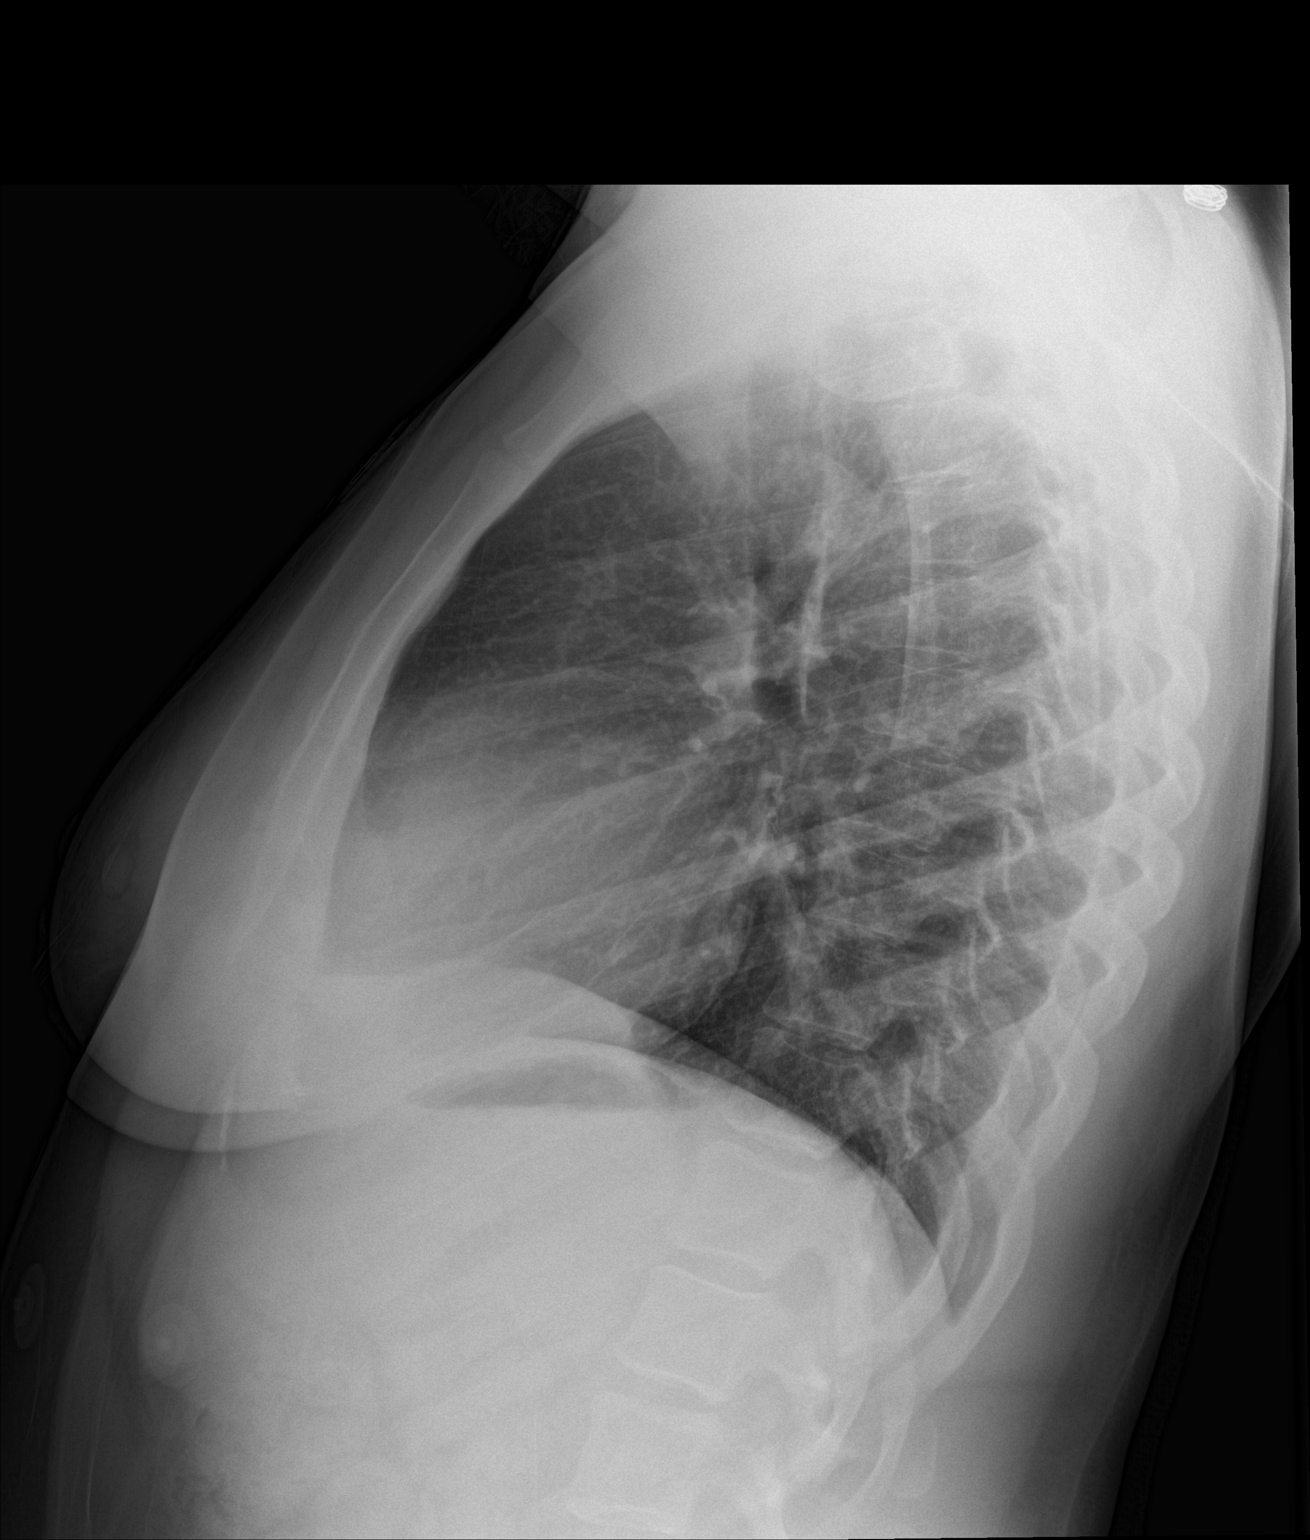

[2 of 2 positions shown; findings below may reference images not displayed]

FINDINGS: The heart size and mediastinal contours are within normal limits.
Both lungs are clear. The visualized skeletal structures are
unremarkable.
IMPRESSION: No active cardiopulmonary disease.

## 2014-11-12 MED ORDER — GI COCKTAIL ~~LOC~~
30.0000 mL | Freq: Once | ORAL | Status: AC
  Administered 2014-11-12: 30 mL via ORAL
  Filled 2014-11-12: qty 30

## 2014-11-12 MED ORDER — POTASSIUM CHLORIDE CRYS ER 20 MEQ PO TBCR
40.0000 meq | EXTENDED_RELEASE_TABLET | Freq: Once | ORAL | Status: AC
  Administered 2014-11-12: 40 meq via ORAL
  Filled 2014-11-12: qty 2

## 2014-11-12 NOTE — ED Provider Notes (Addendum)
CSN: [id number]     Arrival date & time 11/12/14  2212 History   First MD Initiated Contact with Patient 11/12/14 2248     Chief Complaint  Patient presents with  . Chest Pain     (Consider location/radiation/quality/duration/timing/severity/associated sxs/prior Treatment) HPI Comments: Treated earl;ier for allergic reaction now with chest pain   The history is provided by the patient.    History reviewed. No pertinent past medical history. History reviewed. No pertinent past surgical history. No family history on file. Social History  Substance Use Topics  . Smoking status: Current Every Day Smoker -- 0.25 packs/day    Types: Cigarettes  . Smokeless tobacco: None  . Alcohol Use: No   OB History    No data available     Review of Systems  Respiratory: Negative for shortness of breath.   All other systems reviewed and are negative.     Allergies  Review of patient's allergies indicates no known allergies.  Home Medications   Prior to Admission medications   Medication Sig Start Date End Date Taking? Authorizing Provider  buPROPion (WELLBUTRIN XL) 150 MG 24 hr tablet Take 150 mg by mouth daily.   Yes Historical Provider, MD  LORazepam (ATIVAN) 0.5 MG tablet Take 0.5 mg by mouth every 6 (six) hours as needed for anxiety.   Yes Historical Provider, MD   BP 138/79 mmHg  Pulse 80  Temp(Src) 98.5 F (36.9 C) (Oral)  Resp 15  Ht 5\' 6"  (1.676 m)  Wt 187 lb (84.823 kg)  BMI 30.20 kg/m2  SpO2 100%  LMP 10/29/2014 Physical Exam  Constitutional: She is oriented to person, place, and time. She appears well-developed.  HENT:  Head: Normocephalic.  Eyes: Pupils are equal, round, and reactive to light.  Neck: Normal range of motion.  Cardiovascular: Normal rate.   Pulmonary/Chest: Effort normal.  Musculoskeletal: She exhibits no edema or tenderness.  Neurological: She is alert and oriented to person, place, and time.  Skin: Skin is warm.  Nursing note and vitals  reviewed.   ED Course  Procedures (including critical care time) Labs Review Labs Reviewed  BASIC METABOLIC PANEL - Abnormal; Notable for the following:    Potassium 3.2 (*)    Creatinine, Ser 1.05 (*)    All other components within normal limits  CBC  D-DIMER, QUANTITATIVE (NOT AT [facility])  Rosezena Sensor, ED    Imaging Review Dg Chest 2 View  11/12/2014   CLINICAL DATA:  Chest pain for 2 days.  Smoker.  EXAM: CHEST  2 VIEW  COMPARISON:  02/08/2007  FINDINGS: The heart size and mediastinal contours are within normal limits. Both lungs are clear. The visualized skeletal structures are unremarkable.  IMPRESSION: No active cardiopulmonary disease.   Electronically Signed   By: Burman Nieves M.D.   On: 11/12/2014 22:50   I have personally reviewed and evaluated these images and lab results as part of my medical decision-making.   EKG Interpretation   Date/Time:  Thursday November 12 2014 22:16:40 EDT Ventricular Rate:  82 PR Interval:  142 QRS Duration: 88 QT Interval:  358 QTC Calculation: 418 R Axis:   75 Text Interpretation:  Normal sinus rhythm with sinus arrhythmia  Nonspecific T wave abnormality Abnormal ECG Confirmed by Juleen China  MD,  STEPHEN ([id number]) on 11/12/2014 10:33:38 PM     Patient's labs are reviewed all within normal parameters.  EKG and chest x-ray normal as well.  She was noted to have slightly lower potassium.  This  was supplemented with 40 mEq in the ED, I did not give her prescription for supplementation, but gave her a dietary outside with potassium-rich foods.  She is also been instructed to start taking Pepcid twice a day 1 week and then daily after that.  If this decreases the frequency or totally eliminates her chest discomfort.  She is to see her primary care physician and ask for referral to gastroenterology MDM   Final diagnoses:  Chest discomfort  Hypokalemia          Earley Favor, NP 11/13/14 0025  Raeford Razor, MD 11/19/14 [id number]  Earley Favor, NP 12/26/14 5621  Raeford Razor, MD 12/26/14 2208

## 2014-11-12 NOTE — ED Notes (Signed)
Pt. reports intermittent left/lower chest pain radiating to left arm onset yesterday , " indigestion " with mild SOB , no nausea or diaphoresis , denies cough or congestion .

## 2014-11-13 LAB — D-DIMER, QUANTITATIVE: D-Dimer, Quant: 0.39 ug/mL-FEU (ref 0.00–0.48)

## 2014-11-13 NOTE — ED Notes (Signed)
Pt left with all her belongings and ambulated out of treatment area.  

## 2014-11-13 NOTE — Discharge Instructions (Signed)
Chest Pain (Nonspecific) It is often hard to give a diagnosis for the cause of chest pain. There is always a chance that your pain could be related to something serious, such as a heart attack or a blood clot in the lungs. You need to follow up with your doctor. HOME CARE  If antibiotic medicine was given, take it as directed by your doctor. Finish the medicine even if you start to feel better.  For the next few days, avoid activities that bring on chest pain. Continue physical activities as told by your doctor.  Do not use any tobacco products. This includes cigarettes, chewing tobacco, and e-cigarettes.  Avoid drinking alcohol.  Only take medicine as told by your doctor.  Follow your doctor's suggestions for more testing if your chest pain does not go away.  Keep all doctor visits you made. GET HELP IF:  Your chest pain does not go away, even after treatment.  You have a rash with blisters on your chest.  You have a fever. GET HELP RIGHT AWAY IF:   You have more pain or pain that spreads to your arm, neck, jaw, back, or belly (abdomen).  You have shortness of breath.  You cough more than usual or cough up blood.  You have very bad back or belly pain.  You feel sick to your stomach (nauseous) or throw up (vomit).  You have very bad weakness.  You pass out (faint).  You have chills. This is an emergency. Do not wait to see if the problems will go away. Call your local emergency services (911 in [place] Do not drive yourself to the hospital. MAKE SURE YOU:   Understand these instructions.  Will watch your condition.  Will get help right away if you are not doing well or get worse. Document Released: 08/23/2007 Document Revised: 03/11/2013 Document Reviewed: 08/23/2007 [facility] Patient Information 2015 [place], [place]. This information is not intended to replace advice given to you by your health care provider. Make sure you discuss any questions you have with your  health care provider. Today you were evaluated for your chest pain, which does not appear to be cardiac in nature.  Your chest x-ray is normal you.  EKG, and labs are all within normal parameters as well.  You've also been evaluated for pulmonary embolus which can cause chest pain and shortness of breath.  This test is negative.  Please follow-up with your primary care physician as needed.  For further investigation if you continue to have this type of pain. Since the GI cocktail did decrease her pain significantly.  I would suggest taking Pepcid which is an over-the-counter medication starting with one tablet twice a day for 1 week and then 1 tablet daily.  If you find that you do not have any more chest discomfort like you experience tonight.  I would recommend following up with your primary care physician in asking for a referral to gastroenterology You've been given a list of foods that are higher in potassium content.  Please include these in your diet rather than take a tablet supplement his eye was better to include the foods in your diet to supplement her potassium naturally

## 2015-03-21 DIAGNOSIS — F410 Panic disorder [episodic paroxysmal anxiety] without agoraphobia: Secondary | ICD-10-CM

## 2015-03-21 HISTORY — DX: Panic disorder (episodic paroxysmal anxiety): F41.0

## 2015-06-23 ENCOUNTER — Encounter: Payer: Self-pay | Admitting: Obstetrics

## 2015-06-23 ENCOUNTER — Ambulatory Visit (INDEPENDENT_AMBULATORY_CARE_PROVIDER_SITE_OTHER): Payer: 59 | Admitting: Obstetrics

## 2015-06-23 VITALS — BP 145/80 | HR 89 | Wt 201.0 lb

## 2015-06-23 DIAGNOSIS — Z3201 Encounter for pregnancy test, result positive: Secondary | ICD-10-CM

## 2015-06-23 DIAGNOSIS — Z3687 Encounter for antenatal screening for uncertain dates: Secondary | ICD-10-CM

## 2015-06-23 DIAGNOSIS — Z3200 Encounter for pregnancy test, result unknown: Secondary | ICD-10-CM

## 2015-06-23 DIAGNOSIS — O09521 Supervision of elderly multigravida, first trimester: Secondary | ICD-10-CM | POA: Diagnosis not present

## 2015-06-23 LAB — POCT URINE PREGNANCY: PREG TEST UR: POSITIVE — AB

## 2015-06-23 MED ORDER — VITAFOL-ONE 29-1-200 MG PO CAPS: 1.0000 | ORAL_CAPSULE | Freq: Every day | ORAL | Status: DC

## 2015-06-24 ENCOUNTER — Encounter: Payer: Self-pay | Admitting: Obstetrics

## 2015-06-24 NOTE — Progress Notes (Signed)
Subjective:    Tracey [name] is being seen today for her first obstetrical visit.  This is a planned pregnancy. She is at 1145w4d gestation. Her obstetrical history is significant for advanced maternal age. Relationship with FOB: spouse, living together. Patient does intend to breast feed. Pregnancy history fully reviewed.  The information documented in the HPI was reviewed and verified.  Menstrual History: OB History    Gravida Para Term Preterm AB TAB SAB Ectopic Multiple Living   3 0 0 0 0 0 0 0 0 2        Patient's last menstrual period was 04/25/2015.    Past Medical History  Diagnosis Date  . Medical history non-contributory     History reviewed. No pertinent past surgical history.   (Not in a hospital admission) No Known Allergies  Social History  Substance Use Topics  . Smoking status: Current Every Day Smoker -- 0.25 packs/day    Types: Cigarettes  . Smokeless tobacco: Not on file     Comment: 2 cig/day  . Alcohol Use: No    Family History  Problem Relation Age of Onset  . Hypertension Mother   . Diabetes Maternal Aunt   . Diabetes Maternal Uncle      Review of Systems Constitutional: negative for weight loss Gastrointestinal: negative for vomiting Genitourinary:negative for genital lesions and vaginal discharge and dysuria Musculoskeletal:negative for back pain Behavioral/Psych: negative for abusive relationship, depression, illegal drug usage and tobacco use    Objective:    BP 145/80 mmHg  Pulse 89  Wt 201 lb (91.173 kg)  LMP 04/25/2015 General Appearance:    Alert, cooperative, no distress, appears stated age  Head:    Normocephalic, without obvious abnormality, atraumatic  Eyes:    PERRL, conjunctiva/corneas clear, EOM's intact, fundi    benign, both eyes  Ears:    Normal TM's and external ear canals, both ears  Nose:   Nares normal, septum midline, mucosa normal, no drainage    or sinus tenderness  Throat:   Lips, mucosa, and tongue normal; teeth  and gums normal  Neck:   Supple, symmetrical, trachea midline, no adenopathy;    thyroid:  no enlargement/tenderness/nodules; no carotid   bruit or JVD  Back:     Symmetric, no curvature, ROM normal, no CVA tenderness  Lungs:     Clear to auscultation bilaterally, respirations unlabored  Chest Wall:    No tenderness or deformity   Heart:    Regular rate and rhythm, S1 and S2 normal, no murmur, rub   or gallop  Breast Exam:    No tenderness, masses, or nipple abnormality  Abdomen:     Soft, non-tender, bowel sounds active all four quadrants,    no masses, no organomegaly  Genitalia:    Normal female without lesion, discharge or tenderness  Extremities:   Extremities normal, atraumatic, no cyanosis or edema  Pulses:   2+ and symmetric all extremities  Skin:   Skin color, texture, turgor normal, no rashes or lesions  Lymph nodes:   Cervical, supraclavicular, and axillary nodes normal  Neurologic:   CNII-XII intact, normal strength, sensation and reflexes    throughout      Lab Review Urine pregnancy test Labs reviewed yes Radiologic studies reviewed no Assessment:    Pregnancy at 3545w4d weeks    Plan:      Prenatal vitamins.  Counseling provided regarding continued use of seat belts, cessation of alcohol consumption, smoking or use of illicit drugs; infection precautions i.e., influenza/TDAP  immunizations, toxoplasmosis,CMV, parvovirus, listeria and varicella; workplace safety, exercise during pregnancy; routine dental care, safe medications, sexual activity, hot tubs, saunas, pools, travel, caffeine use, fish and methlymercury, potential toxins, hair treatments, varicose veins Weight gain recommendations per IOM guidelines reviewed: underweight/BMI< 18.5--> gain 28 - 40 lbs; normal weight/BMI 18.5 - 24.9--> gain 25 - 35 lbs; overweight/BMI 25 - 29.9--> gain 15 - 25 lbs; obese/BMI >30->gain  11 - 20 lbs Problem list reviewed and updated. FIRST/CF mutation testing/NIPT/QUAD  SCREEN/fragile X/Ashkenazi Jewish population testing/Spinal muscular atrophy discussed: requested. Role of ultrasound in pregnancy discussed; fetal survey: requested. Amniocentesis discussed: undecided. VBAC calculator score: VBAC consent form provided Meds ordered this encounter  Medications  . Prenatal Vit-Fe Fumarate-FA (MULTIVITAMIN-PRENATAL) 27-0.8 MG TABS tablet    Sig: Take 1 tablet by mouth daily at 12 noon.  . Prenatal Vit-FePoly-FA-DHA (VITAFOL-ONE) 29-1-200 MG CAPS    Sig: Take 1 capsule by mouth daily before breakfast.    Dispense:  90 capsule    Refill:  3   Orders Placed This Encounter  Procedures  . Culture, OB Urine  . US OB Transvaginal    Standing Status: Future     Number of Occurrences:      Standing Expiration Date: 08/22/2016    Order Specific Question:  Reason for Exam (SYMPTOM  OR DIAGNOSIS REQUIRED)    Answer:  Unsure LMP    Order Specific Question:  Preferred imaging location?    Answer:  [facility]  . US OB Comp Less 14 Wks    Standing Status: Future     Number of Occurrences:      Standing Expiration Date: 08/22/2016    Order Specific Question:  Reason for Exam (SYMPTOM  OR DIAGNOSIS REQUIRED)    Answer:  unsure    Order Specific Question:  Preferred imaging location?    Answer:  [facility]  . HIV antibody  . Hemoglobinopathy evaluation  . VITAMIN D 25 Hydroxy (Vit-D Deficiency, Fractures)  . Prenatal Profile I  . NuSwab Vaginitis Plus (VG+)  . POCT urine pregnancy    Follow up in 4 weeks.

## 2015-06-26 LAB — AB SCR+ANTIBODY ID

## 2015-06-26 LAB — URINE CULTURE, OB REFLEX

## 2015-06-26 LAB — CULTURE, OB URINE

## 2015-06-28 ENCOUNTER — Other Ambulatory Visit: Payer: Self-pay | Admitting: Obstetrics

## 2015-06-28 DIAGNOSIS — B3731 Acute candidiasis of vulva and vagina: Secondary | ICD-10-CM

## 2015-06-28 DIAGNOSIS — B373 Candidiasis of vulva and vagina: Secondary | ICD-10-CM

## 2015-06-28 LAB — NUSWAB VAGINITIS PLUS (VG+)
CANDIDA ALBICANS, NAA: POSITIVE — AB
CANDIDA GLABRATA, NAA: NEGATIVE
CHLAMYDIA TRACHOMATIS, NAA: NEGATIVE
Neisseria gonorrhoeae, NAA: NEGATIVE
TRICH VAG BY NAA: NEGATIVE

## 2015-06-28 LAB — HEMOGLOBINOPATHY EVALUATION
HEMOGLOBIN A2 QUANTITATION: 2.7 % (ref 0.7–3.1)
HEMOGLOBIN F QUANTITATION: 0.6 % (ref 0.0–2.0)
HGB A: 96.7 % (ref 94.0–98.0)
HGB C: 0 %
HGB S: 0 %

## 2015-06-28 LAB — PRENATAL PROFILE I(LABCORP)
BASOS ABS: 0 10*3/uL (ref 0.0–0.2)
Basos: 0 %
EOS (ABSOLUTE): 0 10*3/uL (ref 0.0–0.4)
Eos: 0 %
HEMOGLOBIN: 12.5 g/dL (ref 11.1–15.9)
Hematocrit: 38.9 % (ref 34.0–46.6)
Hepatitis B Surface Ag: NEGATIVE
IMMATURE GRANULOCYTES: 0 %
Immature Grans (Abs): 0 10*3/uL (ref 0.0–0.1)
LYMPHS ABS: 1.7 10*3/uL (ref 0.7–3.1)
Lymphs: 24 %
MCH: 28.6 pg (ref 26.6–33.0)
MCHC: 32.1 g/dL (ref 31.5–35.7)
MCV: 89 fL (ref 79–97)
MONOS ABS: 0.7 10*3/uL (ref 0.1–0.9)
Monocytes: 10 %
NEUTROS PCT: 66 %
Neutrophils Absolute: 4.8 10*3/uL (ref 1.4–7.0)
PLATELETS: 346 10*3/uL (ref 150–379)
RBC: 4.37 x10E6/uL (ref 3.77–5.28)
RDW: 14.7 % (ref 12.3–15.4)
RPR Ser Ql: NONREACTIVE
Rh Factor: POSITIVE
Rubella Antibodies, IGG: 4.41 index (ref >0.99)
WBC: 7.3 10*3/uL (ref 3.4–10.8)

## 2015-06-28 LAB — HIV ANTIBODY (ROUTINE TESTING W REFLEX): HIV Screen 4th Generation wRfx: NONREACTIVE

## 2015-06-28 LAB — VITAMIN D 25 HYDROXY (VIT D DEFICIENCY, FRACTURES): Vit D, 25-Hydroxy: 14.3 ng/mL — ABNORMAL LOW (ref 30.0–100.0)

## 2015-06-28 MED ORDER — TERCONAZOLE 0.4 % VA CREA: 1.0000 | TOPICAL_CREAM | Freq: Every day | VAGINAL | Status: DC

## 2015-06-29 ENCOUNTER — Encounter: Payer: Self-pay | Admitting: *Deleted

## 2015-07-02 ENCOUNTER — Ambulatory Visit (HOSPITAL_COMMUNITY)
Admission: RE | Admit: 2015-07-02 | Discharge: 2015-07-02 | Disposition: A | Discharge status: Home / Self Care | Payer: 59 | Source: Ambulatory Visit | Attending: Obstetrics | Admitting: Obstetrics

## 2015-07-02 DIAGNOSIS — Z36 Encounter for antenatal screening of mother: Secondary | ICD-10-CM | POA: Diagnosis present

## 2015-07-02 DIAGNOSIS — Z3A01 Less than 8 weeks gestation of pregnancy: Secondary | ICD-10-CM | POA: Diagnosis not present

## 2015-07-02 DIAGNOSIS — O09521 Supervision of elderly multigravida, first trimester: Secondary | ICD-10-CM

## 2015-07-02 DIAGNOSIS — Z3687 Encounter for antenatal screening for uncertain dates: Secondary | ICD-10-CM

## 2015-07-02 IMAGING — US US OB TRANSVAGINAL
1 series · 15 of 28 positions shown · non-contrast
Comparison: None.

CLINICAL DATA: Viability.  Unsure dating

EXAM:
OBSTETRIC <14 WK US AND TRANSVAGINAL OB US
TECHNIQUE: Both transabdominal and transvaginal ultrasound examinations were
performed for complete evaluation of the gestation as well as the
maternal uterus, adnexal regions, and pelvic cul-de-sac.
Transvaginal technique was performed to assess early pregnancy.

[Series 1: us ob transvaginal · 15 of 80 slices shown]
[im 1/80]
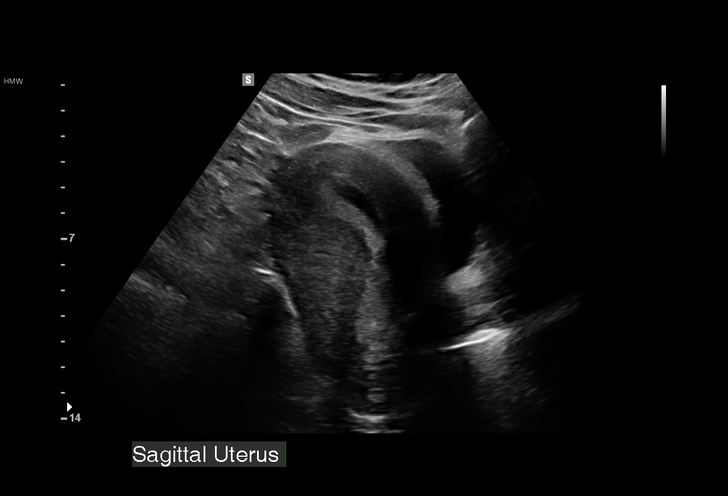
[im 6/80]
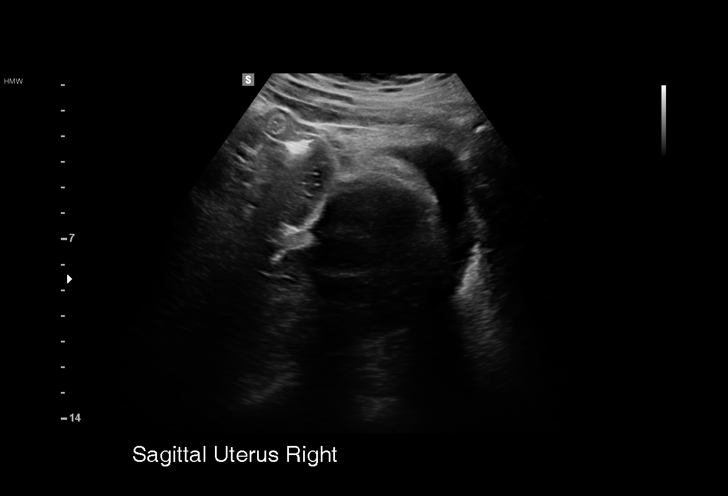
[im 12/80]
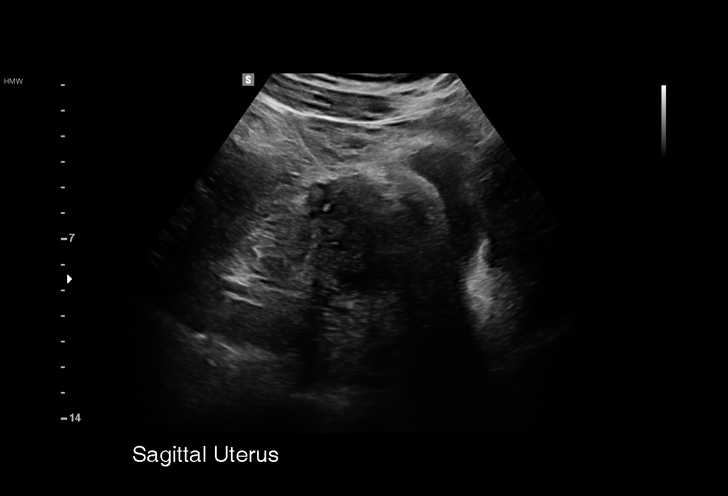
[im 18/80]
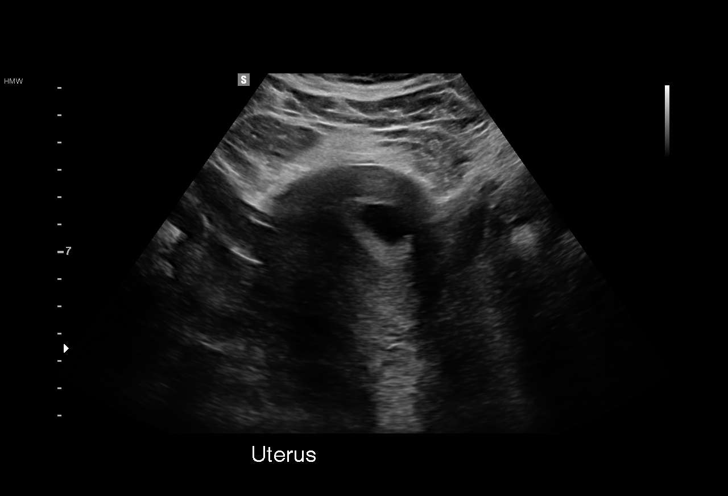
[im 24/80]
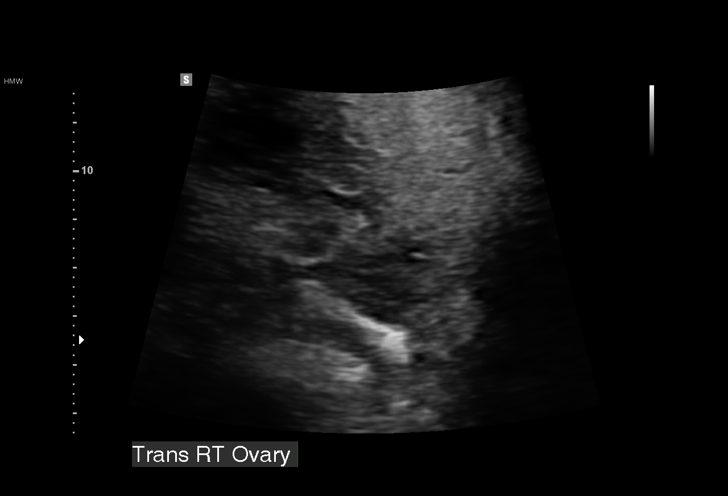
[im 30/80]
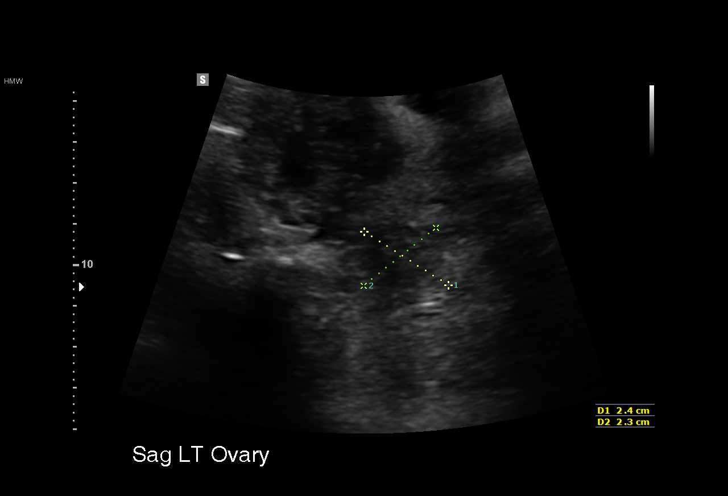
[im 36/80]
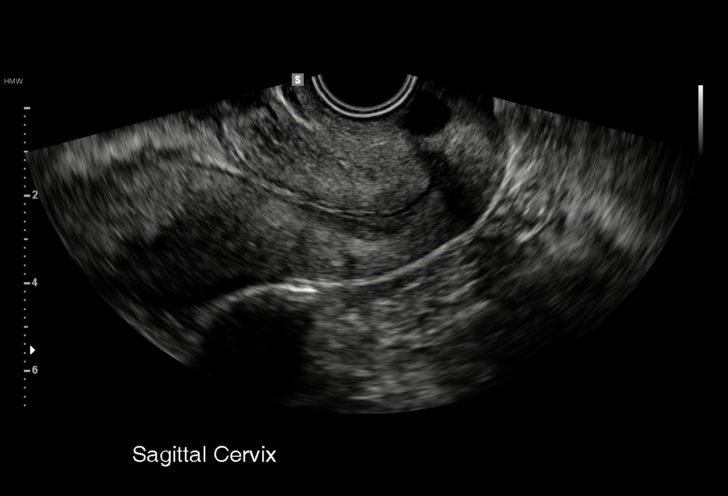
[im 41/80]
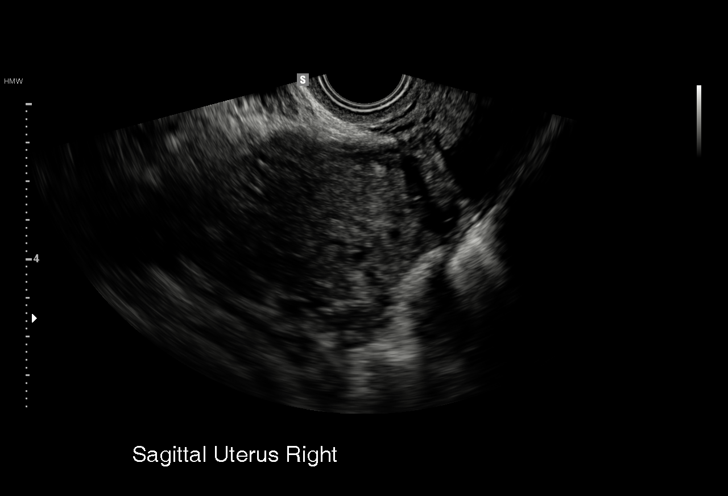
[im 44/80]
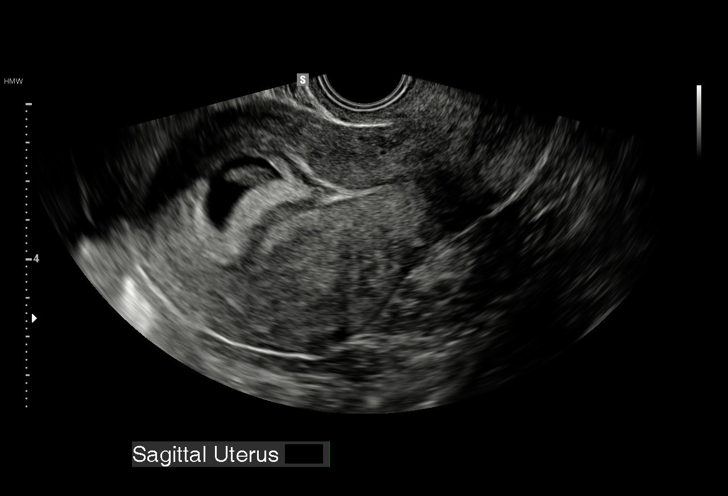
[im 50/80]
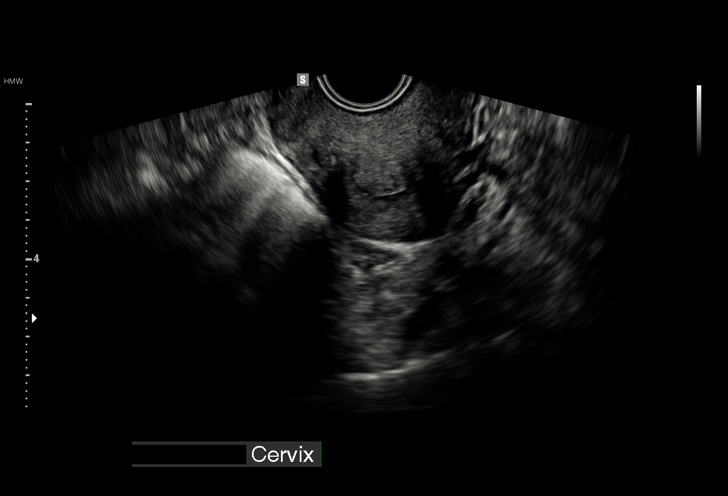
[im 56/80]
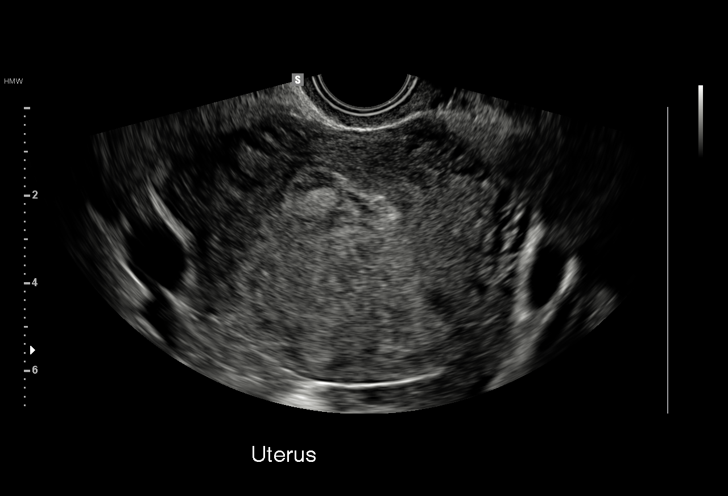
[im 62/80]
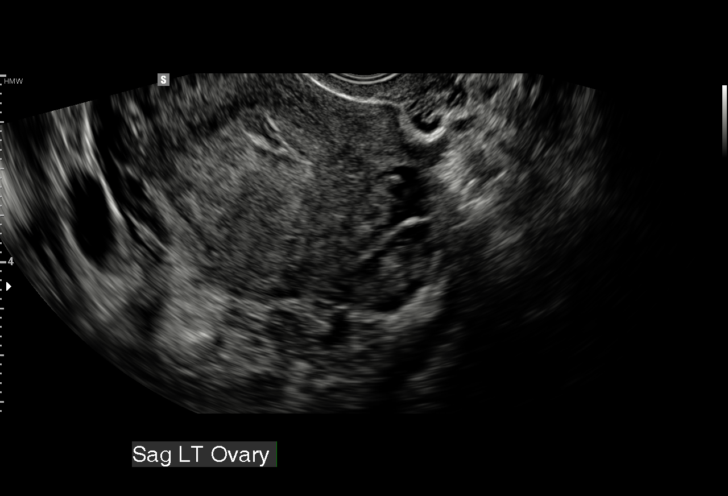
[im 68/80]
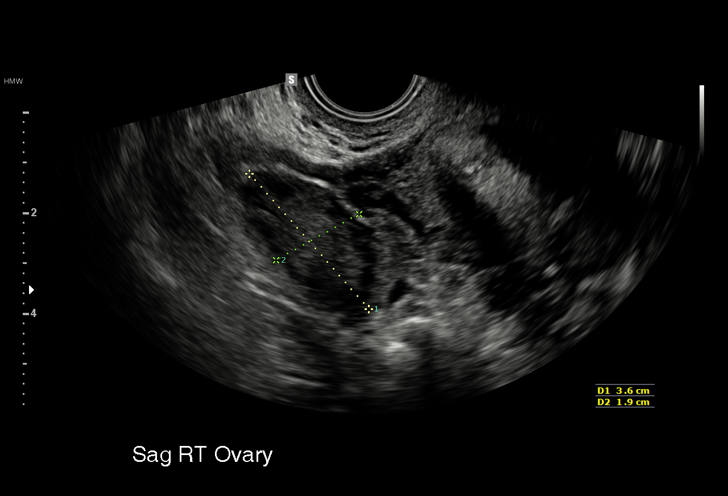
[im 74/80]
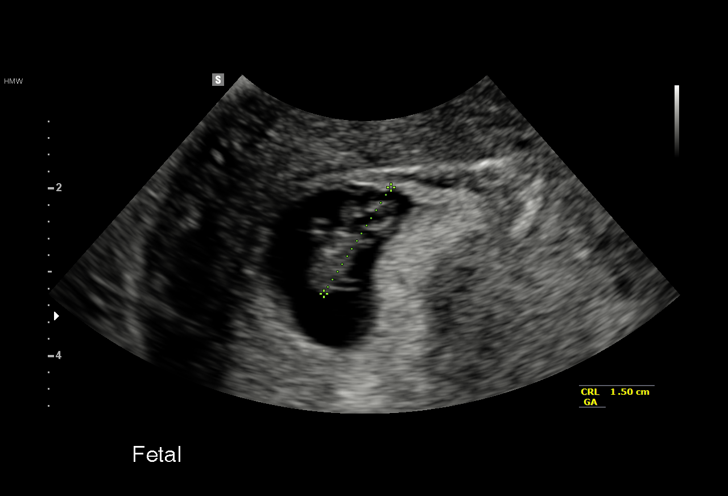
[im 80/80]
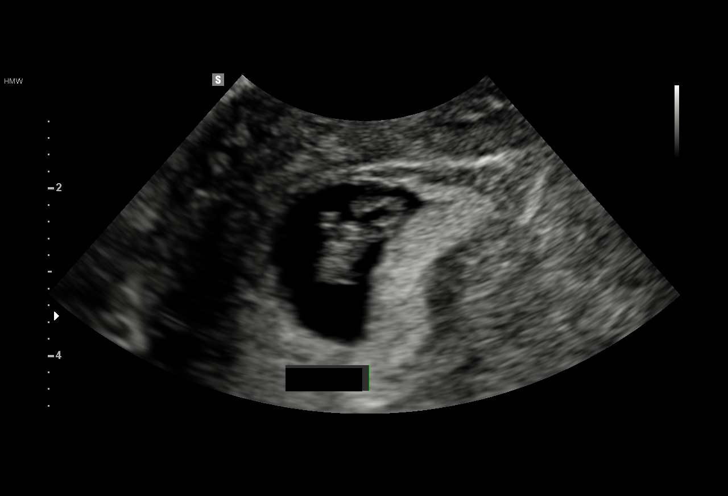

[15 of 28 positions shown; findings below may reference images not displayed]

FINDINGS: Intrauterine gestational sac: Visualized

Yolk sac:  Visualized

Embryo:  Visualized

Cardiac Activity: Visualized

Heart Rate: 161  bpm

MSD:   mm    w     d

CRL:  14.7  mm   7 w   6 d                  US EDC: 02/16/2016

Subchorionic hemorrhage:  None visualized.

Maternal uterus/adnexae: No adnexal masses or free fluid.
IMPRESSION: Seven week 6 day intrauterine pregnancy. Fetal heart rate 161 beats
per minute. No acute maternal findings.

## 2015-07-21 ENCOUNTER — Encounter: Payer: Self-pay | Admitting: Obstetrics

## 2015-07-21 ENCOUNTER — Other Ambulatory Visit: Payer: Self-pay | Admitting: *Deleted

## 2015-07-21 ENCOUNTER — Ambulatory Visit (INDEPENDENT_AMBULATORY_CARE_PROVIDER_SITE_OTHER): Payer: 59 | Admitting: Obstetrics

## 2015-07-21 ENCOUNTER — Inpatient Hospital Stay (HOSPITAL_COMMUNITY)
Admission: AD | Admit: 2015-07-21 | Discharge: 2015-07-21 | Disposition: A | Discharge status: Home / Self Care | Payer: 59 | Source: Ambulatory Visit | Attending: Obstetrics | Admitting: Obstetrics

## 2015-07-21 ENCOUNTER — Inpatient Hospital Stay (HOSPITAL_COMMUNITY): Payer: 59

## 2015-07-21 ENCOUNTER — Encounter (HOSPITAL_COMMUNITY): Payer: Self-pay | Admitting: *Deleted

## 2015-07-21 ENCOUNTER — Other Ambulatory Visit: Payer: Self-pay | Admitting: Obstetrics

## 2015-07-21 VITALS — BP 128/86 | HR 69 | Wt 205.0 lb

## 2015-07-21 DIAGNOSIS — F17210 Nicotine dependence, cigarettes, uncomplicated: Secondary | ICD-10-CM | POA: Insufficient documentation

## 2015-07-21 DIAGNOSIS — O021 Missed abortion: Secondary | ICD-10-CM | POA: Diagnosis not present

## 2015-07-21 DIAGNOSIS — Z3481 Encounter for supervision of other normal pregnancy, first trimester: Secondary | ICD-10-CM

## 2015-07-21 DIAGNOSIS — K219 Gastro-esophageal reflux disease without esophagitis: Secondary | ICD-10-CM | POA: Diagnosis not present

## 2015-07-21 DIAGNOSIS — Z3A10 10 weeks gestation of pregnancy: Secondary | ICD-10-CM | POA: Insufficient documentation

## 2015-07-21 DIAGNOSIS — IMO0002 Reserved for concepts with insufficient information to code with codable children: Secondary | ICD-10-CM

## 2015-07-21 LAB — URINALYSIS, ROUTINE W REFLEX MICROSCOPIC
BILIRUBIN URINE: NEGATIVE
GLUCOSE, UA: NEGATIVE mg/dL
HGB URINE DIPSTICK: NEGATIVE
Ketones, ur: NEGATIVE mg/dL
Leukocytes, UA: NEGATIVE
Nitrite: NEGATIVE
Protein, ur: NEGATIVE mg/dL
SPECIFIC GRAVITY, URINE: 1.01 (ref 1.005–1.030)
pH: 7 (ref 5.0–8.0)

## 2015-07-21 IMAGING — US US OB COMP LESS 14 WK
1 series · 15 of 28 positions shown · non-contrast
Comparison: 07/02/2015 obstetric scan.

CLINICAL DATA: 39-year-old pregnant female presents for assessment
of fetal viability. Fetal heart tones were not present in the
clinic.

EDC by earliest sonogram: 02/12/2016, projecting to an expected
gestational age of 10 weeks 4 days.
EXAM:
OBSTETRIC <14 WK US AND TRANSVAGINAL OB US
TECHNIQUE: Both transabdominal and transvaginal ultrasound examinations were
performed for complete evaluation of the gestation as well as the
maternal uterus, adnexal regions, and pelvic cul-de-sac.
Transvaginal technique was performed to assess early pregnancy.

[Series 1: us ob comp less 14 wk · 15 of 46 slices shown]
[im 1/46]
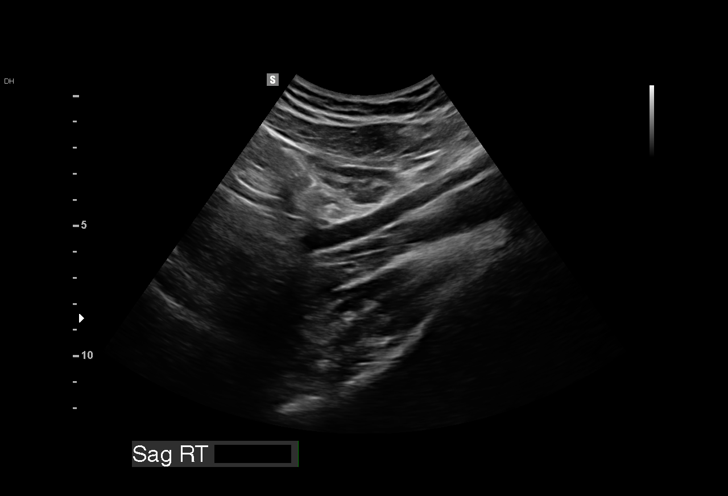
[im 4/46]
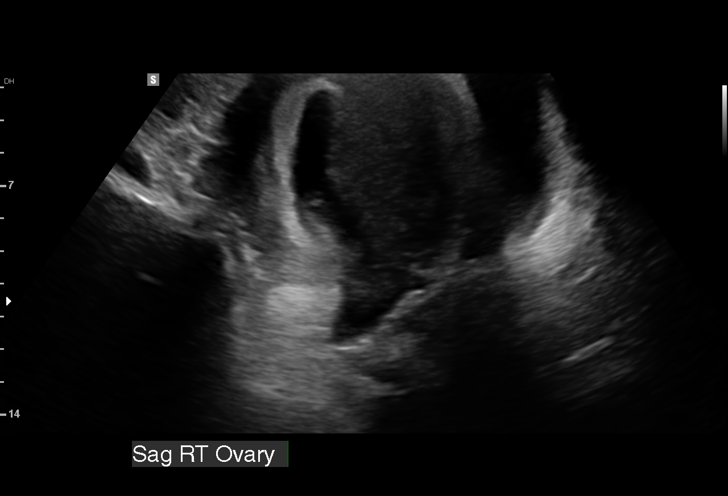
[im 7/46]
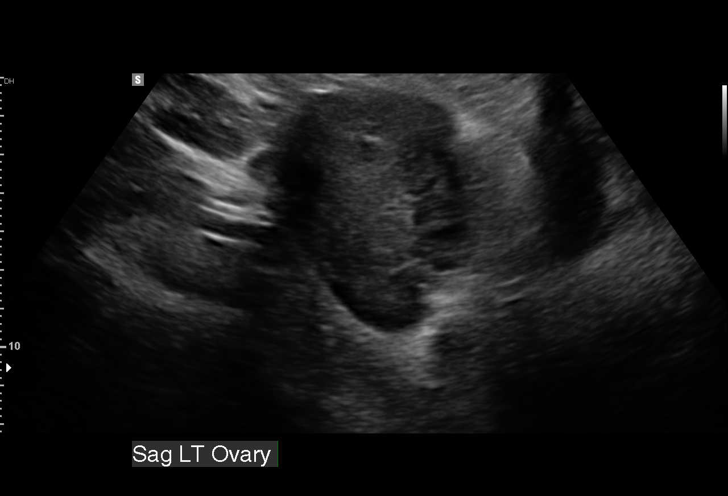
[im 11/46]
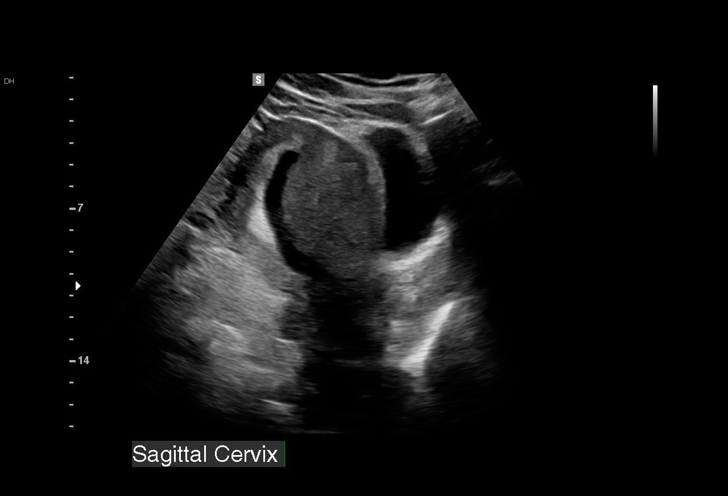
[im 14/46]
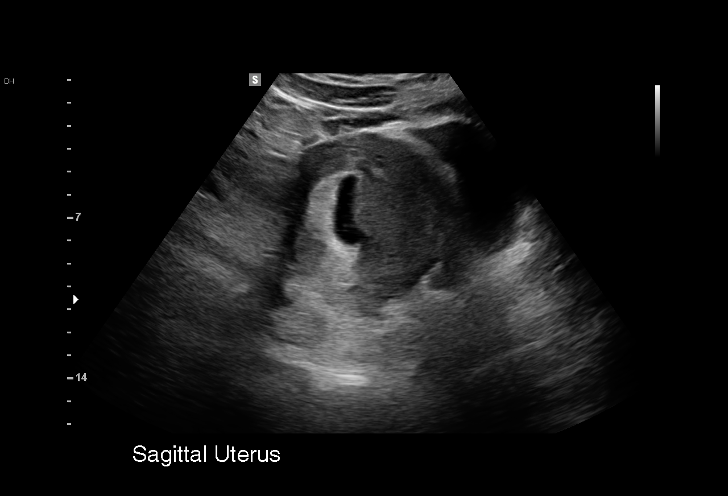
[im 17/46]
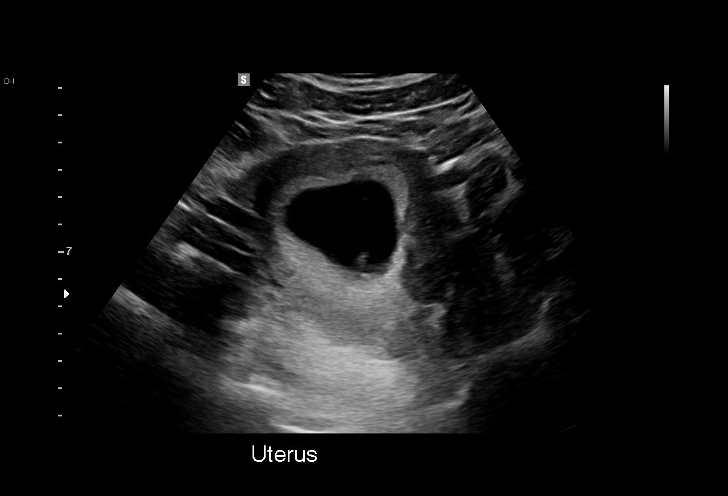
[im 21/46]
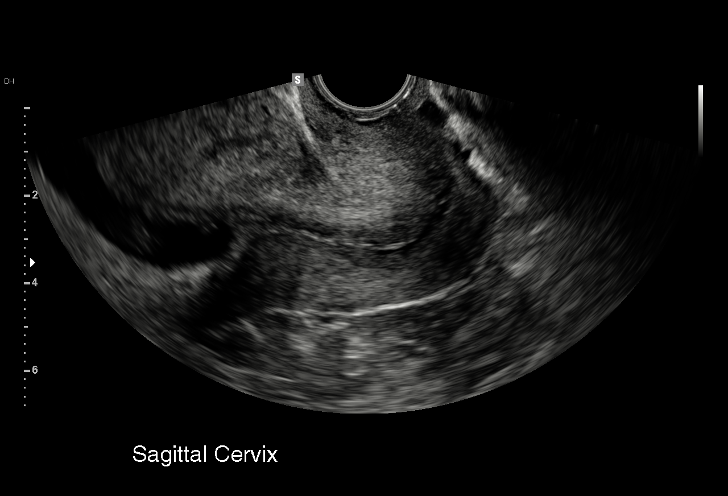
[im 24/46]
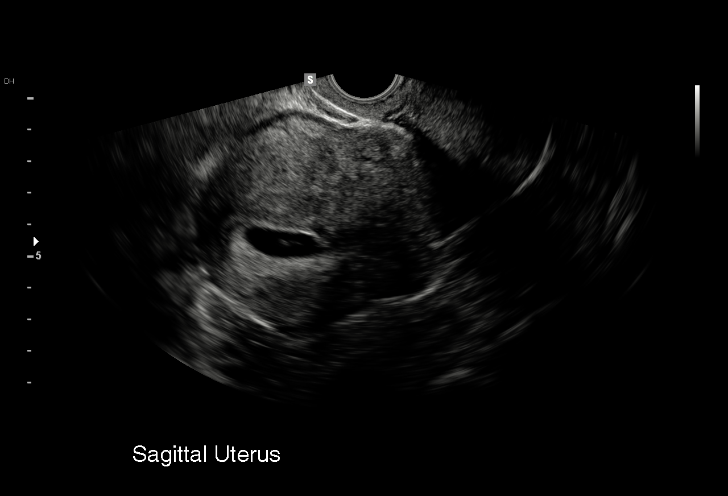
[im 26/46]
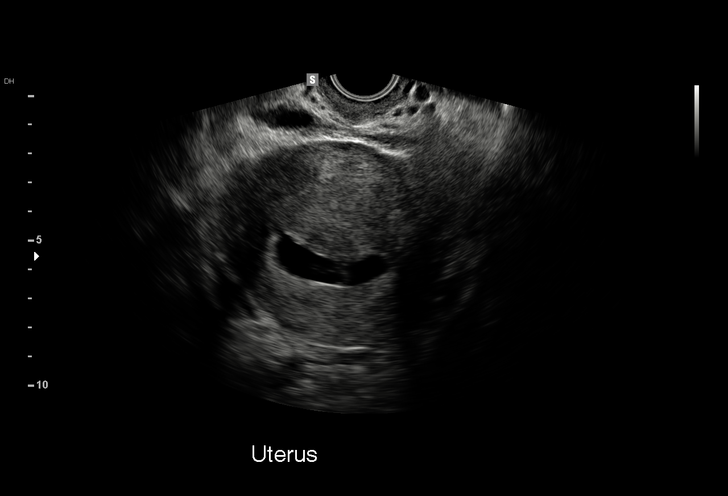
[im 29/46]
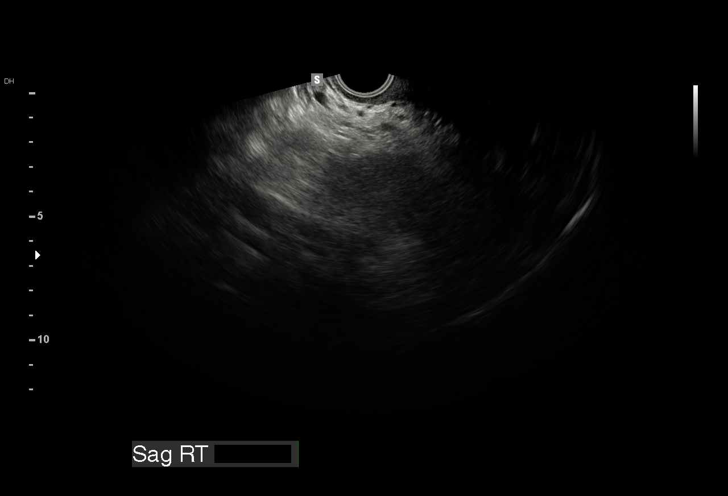
[im 32/46]
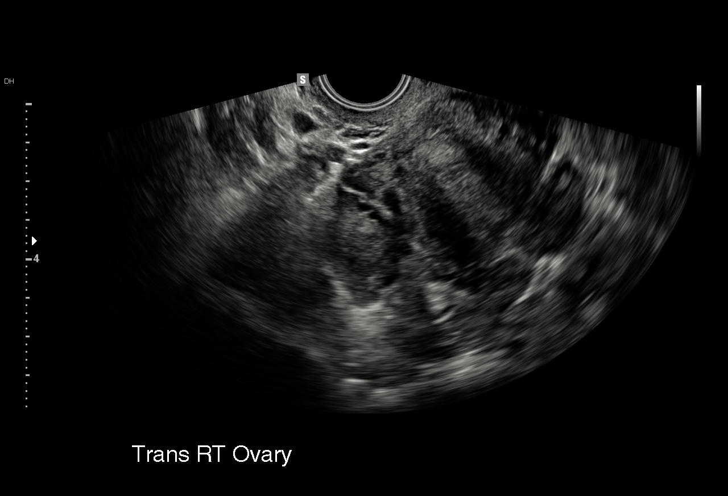
[im 36/46]
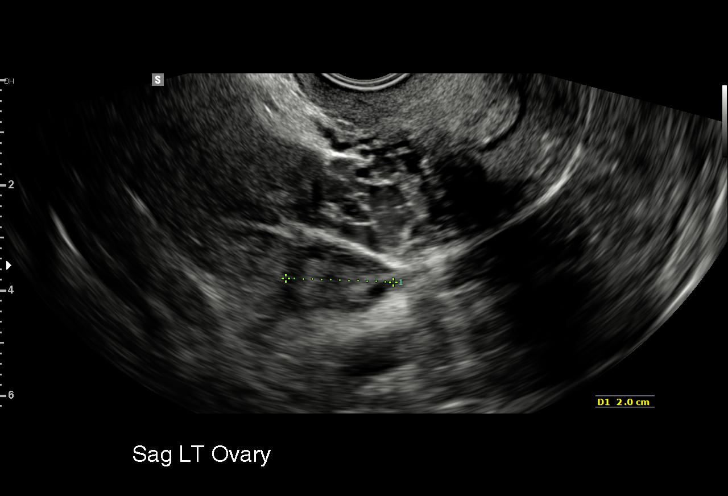
[im 39/46]
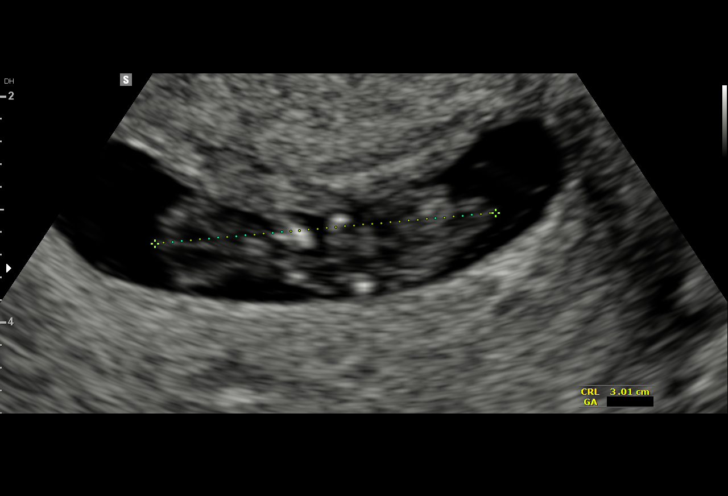
[im 42/46]
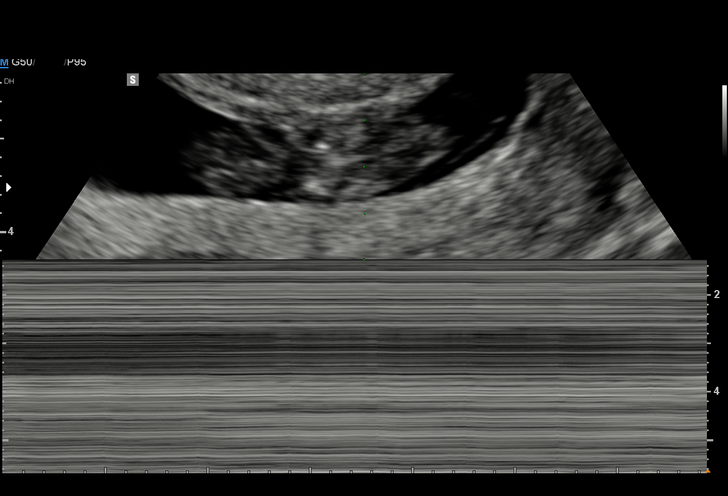
[im 46/46]
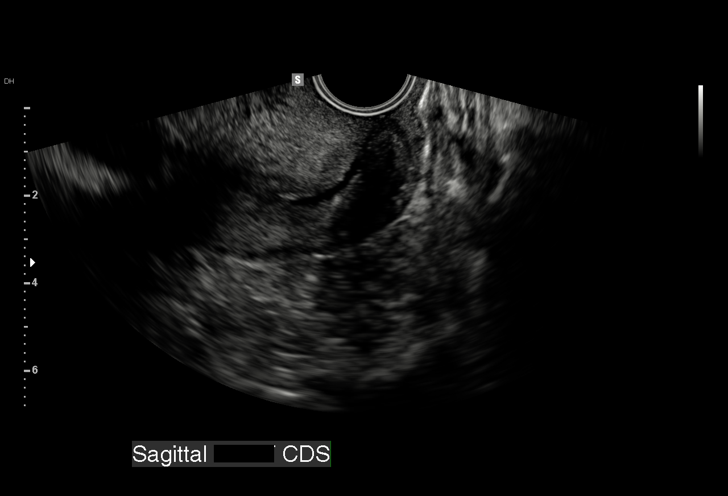

[15 of 28 positions shown; findings below may reference images not displayed]

FINDINGS: Intrauterine gestational sac: Single intrauterine gestational sac
appears normal in size, shape and position.

Yolk sac:  None detected.

Fetus:  Present.

Fetal Cardiac Activity: No fetal cardiac activity on grayscale cine,
M-mode or color Doppler.

CRL:  30.7  mm   10 w   0 d                  US EDC: 02/16/2016

Subchorionic hemorrhage:  None visualized.

Maternal uterus/adnexae: Right ovary measures 1.9 x 2.6 x 2.8 cm.
Left ovary measures 2.5 x 1.3 x 2.0 cm. No ovarian or adnexal
masses. No abnormal free fluid in the pelvis.
IMPRESSION: 1. Single intrauterine gestation at 10 weeks 0 days by crown-rump
length. No fetal cardiac activity. Findings indicate intrauterine
fetal demise.
2. No ovarian or adnexal abnormality.
These results were called by telephone at the time of interpretation
acknowledged these results.

## 2015-07-21 MED ORDER — OMEPRAZOLE 20 MG PO CPDR: 20.0000 mg | DELAYED_RELEASE_CAPSULE | Freq: Two times a day (BID) | ORAL | Status: DC

## 2015-07-21 NOTE — MAU Provider Note (Signed)
Chief Complaint: No chief complaint on file.   None       SUBJECTIVE HPI  Tracey [name] is a 40 y.o. G3P0002 at 8114w4d by LMP who presents to maternity admissions reporting inability to hear fetal heart tones in office. She denies vaginal bleeding, vaginal itching/burning, urinary symptoms, h/a, dizziness, n/v, or fever/chills.    RN Note: Pt was sent from Dr. Verdell CarmineHarper's office for viability scan. MD was unable to doppler FHT in office. Pt denies any pain, vaginal bleeding or abnormal discharge.   Past Medical History  Diagnosis Date  . Medical history non-contributory    History reviewed. No pertinent past surgical history. Social History   Social History  . Marital Status: Single    Spouse Name: N/A  . Number of Children: N/A  . Years of Education: N/A   Occupational History  . Not on file.   Social History Main Topics  . Smoking status: Current Every Day Smoker -- 0.25 packs/day    Types: Cigarettes  . Smokeless tobacco: Not on file     Comment: 2 cig/day  . Alcohol Use: No  . Drug Use: No  . Sexual Activity: Yes   Other Topics Concern  . Not on file   Social History Narrative   No current facility-administered medications on file prior to encounter.   Current Outpatient Prescriptions on File Prior to Encounter  Medication Sig Dispense Refill  . omeprazole (PRILOSEC) 20 MG capsule Take 1 capsule (20 mg total) by mouth 2 (two) times daily before a meal. (Patient not taking: Reported on 07/21/2015) 60 capsule 5  . Prenatal Vit-FePoly-FA-DHA (VITAFOL-ONE) 29-1-200 MG CAPS Take 1 capsule by mouth daily before breakfast. (Patient not taking: Reported on 07/21/2015) 90 capsule 3  . terconazole (TERAZOL 7) 0.4 % vaginal cream Place 1 applicator vaginally at bedtime. (Patient not taking: Reported on 07/21/2015) 45 g 0   No Known Allergies  I have reviewed patient's Past Medical Hx, Surgical Hx, Family Hx, Social Hx, medications and allergies.   ROS:  Review of Systems    Constitutional: Negative for fever and chills.  Gastrointestinal: Negative for nausea, vomiting, abdominal pain, diarrhea and constipation.  Genitourinary: Negative for dysuria. negative  Musculoskeletal: Negative for back pain.  Neurological: Negative for dizziness and weakness.    Physical Exam  Patient Vitals for the past 24 hrs:  BP Temp Temp src Pulse Resp SpO2 Height Weight  07/21/15 1126 125/68 mmHg 98.1 F (36.7 C) Oral 65 18 100 % 5' 8.5" (1.74 m) 206 lb 6.4 oz (93.622 kg)    Physical Exam  Constitutional: Well-developed, well-nourished female in no acute distress.  Cardiovascular: normal rate Respiratory: normal effort GI: Abd soft, non-tender. Pos BS x 4 MS: Extremities nontender, no edema, normal ROM Neurologic: Alert and oriented x 4.  GU: Neg CVAT.   LAB RESULTS Results for orders placed or performed during the hospital encounter of 07/21/15 (from the past 24 hour(s))  Urinalysis, Routine w reflex microscopic ([facility])     Status: None   Collection Time: 07/21/15 11:25 AM  Result Value Ref Range   Color, Urine YELLOW YELLOW   APPearance CLEAR CLEAR   Specific Gravity, Urine 1.010 1.005 - 1.030   pH 7.0 5.0 - 8.0   Glucose, UA NEGATIVE NEGATIVE mg/dL   Hgb urine dipstick NEGATIVE NEGATIVE   Bilirubin Urine NEGATIVE NEGATIVE   Ketones, ur NEGATIVE NEGATIVE mg/dL   Protein, ur NEGATIVE NEGATIVE mg/dL   Nitrite NEGATIVE NEGATIVE   Leukocytes,  UA NEGATIVE NEGATIVE    B/Positive/-- (04/05 1703)  IMAGING US Ob Comp Less 14 Wks  07/21/2015  CLINICAL DATA:  40 year old pregnant female presents for assessment of fetal viability. Fetal heart tones were not present in the clinic. EDC by earliest sonogram: 02/12/2016, projecting to an expected gestational age of [redacted] weeks 4 days. EXAM: OBSTETRIC <14 WK [place] AND TRANSVAGINAL OB US TECHNIQUE: Both transabdominal and transvaginal ultrasound examinations were performed for complete evaluation of the gestation as well as  the maternal uterus, adnexal regions, and pelvic cul-de-sac. Transvaginal technique was performed to assess early pregnancy. COMPARISON:  07/02/2015 obstetric scan. FINDINGS: Intrauterine gestational sac: Single intrauterine gestational sac appears normal in size, shape and position. Yolk sac:  None detected. Fetus:  Present. Fetal Cardiac Activity: No fetal cardiac activity on grayscale cine, M-mode or color Doppler. CRL:  30.7  mm   10 w   0 d                  [place] EDC: 02/16/2016 Subchorionic hemorrhage:  None visualized. Maternal uterus/adnexae: Right ovary measures 1.9 x 2.6 x 2.8 cm. Left ovary measures 2.5 x 1.3 x 2.0 cm. No ovarian or adnexal masses. No abnormal free fluid in the pelvis. IMPRESSION: 1. Single intrauterine gestation at 10 weeks 0 days by crown-rump length. No fetal cardiac activity. Findings indicate intrauterine fetal demise. 2. No ovarian or adnexal abnormality. These results were called by telephone at the time of interpretation on 07/21/2015 at 1:05 pm to [facility], CNM , who verbally acknowledged these results. Electronically Signed   By: Delbert Phenix M.D.   On: 07/21/2015 13:08   US Ob Comp Less 14 Wks  07/02/2015  CLINICAL DATA:  Viability.  Unsure dating EXAM: OBSTETRIC <14 WK [place] AND TRANSVAGINAL OB US TECHNIQUE: Both transabdominal and transvaginal ultrasound examinations were performed for complete evaluation of the gestation as well as the maternal uterus, adnexal regions, and pelvic cul-de-sac. Transvaginal technique was performed to assess early pregnancy. COMPARISON:  None. FINDINGS: Intrauterine gestational sac: Visualized Yolk sac:  Visualized Embryo:  Visualized Cardiac Activity: Visualized Heart Rate: 161  bpm MSD:   mm    w     d CRL:  14.7  mm   7 w   6 d                  [place] EDC: 02/16/2016 Subchorionic hemorrhage:  None visualized. Maternal uterus/adnexae: No adnexal masses or free fluid. IMPRESSION: Seven week 6 day intrauterine pregnancy. Fetal heart rate 161 beats  per minute. No acute maternal findings. Electronically Signed   By: Charlett Nose M.D.   On: 07/02/2015 08:42   US Ob Transvaginal  07/21/2015  CLINICAL DATA:  40 year old pregnant female presents for assessment of fetal viability. Fetal heart tones were not present in the clinic. EDC by earliest sonogram: 02/12/2016, projecting to an expected gestational age of [redacted] weeks 4 days. EXAM: OBSTETRIC <14 WK [place] AND TRANSVAGINAL OB US TECHNIQUE: Both transabdominal and transvaginal ultrasound examinations were performed for complete evaluation of the gestation as well as the maternal uterus, adnexal regions, and pelvic cul-de-sac. Transvaginal technique was performed to assess early pregnancy. COMPARISON:  07/02/2015 obstetric scan. FINDINGS: Intrauterine gestational sac: Single intrauterine gestational sac appears normal in size, shape and position. Yolk sac:  None detected. Fetus:  Present. Fetal Cardiac Activity: No fetal cardiac activity on grayscale cine, M-mode or color Doppler. CRL:  30.7  mm   10 w   0 d  US EDC: 02/16/2016 Subchorionic hemorrhagKoreae:  None visualized. Maternal uterus/adnexae: Right ovary measures 1.9 x 2.6 x 2.8 cm. Left ovary measures 2.5 x 1.3 x 2.0 cm. No ovarian or adnexal masses. No abnormal free fluid in the pelvis. IMPRESSION: 1. Single intrauterine gestation at 10 weeks 0 days by crown-rump length. No fetal cardiac activity. Findings indicate intrauterine fetal demise. 2. No ovarian or adnexal abnormality. These results were called by telephone at the time of interpretation on 07/21/2015 at 1:05 pm to [facility], CNM , who verbally acknowledged these results. Electronically Signed   By: Delbert Phenix M.D.   On: 07/21/2015 13:08   US Ob Transvaginal  07/02/2015  CLINICAL DATA:  Viability.  Unsure dating EXAM: OBSTETRIC <14 WK [place] AND TRANSVAGINAL OB US TECHNIQUE: Both transabdominal and transvaginal ultrasound examinations were performed for complete evaluation of the  gestation as well as the maternal uterus, adnexal regions, and pelvic cul-de-sac. Transvaginal technique was performed to assess early pregnancy. COMPARISON:  None. FINDINGS: Intrauterine gestational sac: Visualized Yolk sac:  Visualized Embryo:  Visualized Cardiac Activity: Visualized Heart Rate: 161  bpm MSD:   mm    w     d CRL:  14.7  mm   7 w   6 d                  [place] EDC: 02/16/2016 Subchorionic hemorrhage:  None visualized. Maternal uterus/adnexae: No adnexal masses or free fluid. IMPRESSION: Seven week 6 day intrauterine pregnancy. Fetal heart rate 161 beats per minute. No acute maternal findings. Electronically Signed   By: Charlett Nose M.D.   On: 07/02/2015 08:42    MAU Management/MDM: Results of ultrasound given to Dr Clearance Coots He called patient and gave them results He offered them D&C for early next week I reviewed usual regimens for this surgery Briefly discussed SAB.  Offered condolences  ASSESSMENT 1. Fetal heart tones not heard   2/    Missed abortion at [redacted]w[redacted]d   PLAN Discharge home Follow up with Dr Clearance Coots    Medication List    ASK your doctor about these medications        omeprazole 20 MG capsule  Commonly known as:  PRILOSEC  Take 1 capsule (20 mg total) by mouth 2 (two) times daily before a meal.     terconazole 0.4 % vaginal cream  Commonly known as:  TERAZOL 7  Place 1 applicator vaginally at bedtime.     VITAFOL-ONE 29-1-200 MG Caps  Take 1 capsule by mouth daily before breakfast.        Pt stable at time of discharge. Encouraged to return here or to other Urgent Care/ED if she develops worsening of symptoms, increase in pain, fever, or other concerning symptoms.    Wynelle Bourgeois CNM, MSN Certified Nurse-Midwife 07/21/2015  1:14 PM

## 2015-07-21 NOTE — Progress Notes (Signed)
Pt states that she is having some lower back pain. 

## 2015-07-21 NOTE — Discharge Instructions (Signed)
Incomplete Miscarriage A miscarriage is the sudden loss of an unborn baby (fetus) before the 20th week of pregnancy. In an incomplete miscarriage, parts of the fetus or placenta (afterbirth) remain in the body.  Having a miscarriage can be an emotional experience. Talk with your health care provider about any questions you may have about miscarrying, the grieving process, and your future pregnancy plans. CAUSES   Problems with the fetal chromosomes that make it impossible for the baby to develop normally. Problems with the baby's genes or chromosomes are most often the result of errors that occur by chance as the embryo divides and grows. The problems are not inherited from the parents.  Infection of the cervix or uterus.  Hormone problems.  Problems with the cervix, such as having an incompetent cervix. This is when the tissue in the cervix is not strong enough to hold the pregnancy.  Problems with the uterus, such as an abnormally shaped uterus, uterine fibroids, or congenital abnormalities.  Certain medical conditions.  Smoking, drinking alcohol, or taking illegal drugs.  Trauma. SYMPTOMS   Vaginal bleeding or spotting, with or without cramps or pain.  Pain or cramping in the abdomen or lower back.  Passing fluid, tissue, or blood clots from the vagina. DIAGNOSIS  Your health care provider will perform a physical exam. You may also have an ultrasound to confirm the miscarriage. Blood or urine tests may also be ordered. TREATMENT   Usually, a dilation and curettage (D&C) procedure is performed. During a D&C procedure, the cervix is widened (dilated) and any remaining fetal or placental tissue is gently removed from the uterus.  Antibiotic medicines are prescribed if there is an infection. Other medicines may be given to reduce the size of the uterus (contract) if there is a lot of bleeding.  If you have Rh negative blood and your baby was Rh positive, you will need a Rho (D)  immune globulin shot. This shot will protect any future baby from having Rh blood problems in future pregnancies.  You may be confined to bed rest. This means you should stay in bed and only get up to use the bathroom. HOME CARE INSTRUCTIONS   Rest as directed by your health care provider.  Restrict activity as directed by your health care provider. You may be allowed to continue light activity if curettage was not done but you require further treatment.  Keep track of the number of pads you use each day. Keep track of how soaked (saturated) they are. Record this information.  Do not  use tampons.  Do not douche or have sexual intercourse until approved by your health care provider.  Keep all follow-up appointments for reevaluation and continuing management.  Only take over-the-counter or prescription medicines for pain, fever, or discomfort as directed by your health care provider.  Take antibiotic medicine as directed by your health care provider. Make sure you finish it even if you start to feel better. SEEK IMMEDIATE MEDICAL CARE IF:   You experience severe cramps in your stomach, back, or abdomen.  You have an unexplained temperature (make sure to record these temperatures).  You pass large clots or tissue (save these for your health care provider to inspect).  Your bleeding increases.  You become light-headed, weak, or have fainting episodes. MAKE SURE YOU:   Understand these instructions.  Will watch your condition.  Will get help right away if you are not doing well or get worse.   This information is not intended to   replace advice given to you by your health care provider. Make sure you discuss any questions you have with your health care provider.   Document Released: 03/06/2005 Document Revised: 03/27/2014 Document Reviewed: 10/03/2012 Elsevier Interactive Patient Education 2016 [employer]  

## 2015-07-21 NOTE — MAU Note (Signed)
Pt was sent from Dr. Verdell CarmineHarper's office for viability scan.  MD was unable to doppler FHT in office.  Pt denies any pain, vaginal bleeding or abnormal discharge.

## 2015-07-22 ENCOUNTER — Other Ambulatory Visit: Payer: Self-pay | Admitting: Obstetrics

## 2015-07-22 ENCOUNTER — Encounter: Payer: Self-pay | Admitting: Obstetrics

## 2015-07-22 DIAGNOSIS — R52 Pain, unspecified: Secondary | ICD-10-CM

## 2015-07-22 MED ORDER — OXYCODONE-ACETAMINOPHEN 10-325 MG PO TABS: 1.0000 | ORAL_TABLET | ORAL | Status: DC | PRN

## 2015-07-22 NOTE — Progress Notes (Signed)
  Subjective:    Tracey BiddingCindy Amir is a 40 y.o. female being seen today for her obstetrical visit. She is at 416w5d gestation. Patient reports: backache.  Problem List Items Addressed This Visit    None    Visit Diagnoses    Encounter for supervision of other normal pregnancy in first trimester    -  Primary    Relevant Orders    POCT urinalysis dipstick    GERD without esophagitis          There are no active problems to display for this patient.   Objective:     BP 128/86 mmHg  Pulse 69  Wt 205 lb (92.987 kg)  LMP 04/25/2015 Uterine Size: Below umbilicus     Assessment:    Pregnancy @ 2216w5d  weeks  Absent FHR with Doppler  Plan:    Ultrasound ordered for viability  Problem list reviewed and updated. Labs reviewed.  Follow up in 4 weeks. FIRST/CF mutation testing/NIPT/QUAD SCREEN/fragile X/Ashkenazi Jewish population testing/Spinal muscular atrophy discussed: requested. Role of ultrasound in pregnancy discussed; fetal survey: requested. Amniocentesis discussed: Denied

## 2015-07-23 ENCOUNTER — Encounter (HOSPITAL_COMMUNITY): Payer: Self-pay | Admitting: *Deleted

## 2015-07-25 NOTE — Anesthesia Preprocedure Evaluation (Addendum)
Anesthesia Evaluation  Patient identified by MRN, date of birth, ID band Patient awake    Reviewed: Allergy & Precautions, H&P , NPO status   Airway Mallampati: I  TM Distance: >3 FB Neck ROM: full    Dental no notable dental hx. (+) Teeth Intact   Pulmonary Current Smoker,    Pulmonary exam normal        Cardiovascular negative cardio ROS Normal cardiovascular exam     Neuro/Psych negative psych ROS   GI/Hepatic Neg liver ROS,   Endo/Other  negative endocrine ROS  Renal/GU negative Renal ROS     Musculoskeletal   Abdominal Normal abdominal exam  (+)   Peds  Hematology negative hematology ROS (+)   Anesthesia Other Findings   Reproductive/Obstetrics negative OB ROS                            Anesthesia Physical Anesthesia Plan  ASA: II  Anesthesia Plan: MAC   Post-op Pain Management:    Induction:   Airway Management Planned:   Additional Equipment:   Intra-op Plan:   Post-operative Plan:   Informed Consent: I have reviewed the patients History and Physical, chart, labs and discussed the procedure including the risks, benefits and alternatives for the proposed anesthesia with the patient or authorized representative who has indicated his/her understanding and acceptance.     Plan Discussed with: CRNA and Surgeon  Anesthesia Plan Comments:        Anesthesia Quick Evaluation

## 2015-07-26 ENCOUNTER — Encounter (HOSPITAL_COMMUNITY)
Admission: RE | Disposition: A | Discharge status: Home / Self Care | Payer: Self-pay | Source: Ambulatory Visit | Attending: Obstetrics

## 2015-07-26 ENCOUNTER — Ambulatory Visit (HOSPITAL_COMMUNITY)
Admission: RE | Admit: 2015-07-26 | Discharge: 2015-07-26 | Disposition: A | Discharge status: Home / Self Care | Payer: 59 | Source: Ambulatory Visit | Attending: Obstetrics | Admitting: Obstetrics

## 2015-07-26 ENCOUNTER — Ambulatory Visit (HOSPITAL_COMMUNITY): Payer: 59 | Admitting: Anesthesiology

## 2015-07-26 ENCOUNTER — Encounter (HOSPITAL_COMMUNITY): Payer: Self-pay | Admitting: Emergency Medicine

## 2015-07-26 DIAGNOSIS — F418 Other specified anxiety disorders: Secondary | ICD-10-CM | POA: Diagnosis not present

## 2015-07-26 DIAGNOSIS — O021 Missed abortion: Secondary | ICD-10-CM | POA: Diagnosis not present

## 2015-07-26 DIAGNOSIS — Z3A10 10 weeks gestation of pregnancy: Secondary | ICD-10-CM | POA: Insufficient documentation

## 2015-07-26 DIAGNOSIS — G5600 Carpal tunnel syndrome, unspecified upper limb: Secondary | ICD-10-CM | POA: Insufficient documentation

## 2015-07-26 DIAGNOSIS — F17210 Nicotine dependence, cigarettes, uncomplicated: Secondary | ICD-10-CM | POA: Insufficient documentation

## 2015-07-26 DIAGNOSIS — K219 Gastro-esophageal reflux disease without esophagitis: Secondary | ICD-10-CM | POA: Insufficient documentation

## 2015-07-26 HISTORY — DX: Anxiety disorder, unspecified: F41.9

## 2015-07-26 HISTORY — PX: DILATION AND EVACUATION: SHX1459

## 2015-07-26 HISTORY — DX: Depression, unspecified: F32.A

## 2015-07-26 HISTORY — DX: Bronchitis, not specified as acute or chronic: J40

## 2015-07-26 HISTORY — DX: Headache, unspecified: R51.9

## 2015-07-26 HISTORY — DX: Carpal tunnel syndrome, bilateral upper limbs: G56.03

## 2015-07-26 HISTORY — DX: Major depressive disorder, single episode, unspecified: F32.9

## 2015-07-26 HISTORY — DX: Panic disorder (episodic paroxysmal anxiety): F41.0

## 2015-07-26 HISTORY — DX: Gastro-esophageal reflux disease without esophagitis: K21.9

## 2015-07-26 HISTORY — DX: Headache: R51

## 2015-07-26 HISTORY — DX: Family history of other specified conditions: Z84.89

## 2015-07-26 HISTORY — DX: Reserved for inherently not codable concepts without codable children: IMO0001

## 2015-07-26 SURGERY — DILATION AND EVACUATION, UTERUS
Anesthesia: Monitor Anesthesia Care | Site: Vagina

## 2015-07-26 MED ORDER — PROPOFOL 10 MG/ML IV BOLUS
INTRAVENOUS | Status: AC
  Filled 2015-07-26: qty 20

## 2015-07-26 MED ORDER — LACTATED RINGERS IV SOLN
INTRAVENOUS | Status: DC | PRN
  Administered 2015-07-26: 08:00:00 via INTRAVENOUS

## 2015-07-26 MED ORDER — SODIUM BICARBONATE 8.4 % IV SOLN
INTRAVENOUS | Status: AC
  Filled 2015-07-26: qty 50

## 2015-07-26 MED ORDER — IBUPROFEN 100 MG/5ML PO SUSP
200.0000 mg | Freq: Four times a day (QID) | ORAL | Status: DC | PRN
  Filled 2015-07-26: qty 20

## 2015-07-26 MED ORDER — KETOROLAC TROMETHAMINE 30 MG/ML IJ SOLN
INTRAMUSCULAR | Status: DC | PRN
  Administered 2015-07-26: 30 mg via INTRAVENOUS

## 2015-07-26 MED ORDER — OXYTOCIN 10 UNIT/ML IJ SOLN
40.0000 [IU] | INTRAVENOUS | Status: DC | PRN
  Administered 2015-07-26: 40 [IU] via INTRAVENOUS

## 2015-07-26 MED ORDER — SCOPOLAMINE 1 MG/3DAYS TD PT72
MEDICATED_PATCH | TRANSDERMAL | Status: AC
  Administered 2015-07-26: 1.5 mg via TRANSDERMAL
  Filled 2015-07-26: qty 1

## 2015-07-26 MED ORDER — MIDAZOLAM HCL 2 MG/2ML IJ SOLN
INTRAMUSCULAR | Status: AC
  Filled 2015-07-26: qty 2

## 2015-07-26 MED ORDER — FENTANYL CITRATE (PF) 100 MCG/2ML IJ SOLN
INTRAMUSCULAR | Status: DC | PRN
  Administered 2015-07-26: 100 ug via INTRAVENOUS

## 2015-07-26 MED ORDER — FENTANYL CITRATE (PF) 100 MCG/2ML IJ SOLN
INTRAMUSCULAR | Status: AC
  Filled 2015-07-26: qty 2

## 2015-07-26 MED ORDER — IBUPROFEN 200 MG PO TABS
200.0000 mg | ORAL_TABLET | Freq: Four times a day (QID) | ORAL | Status: DC | PRN
  Filled 2015-07-26: qty 2

## 2015-07-26 MED ORDER — DEXAMETHASONE SODIUM PHOSPHATE 10 MG/ML IJ SOLN
INTRAMUSCULAR | Status: DC | PRN
  Administered 2015-07-26: 10 mg via INTRAVENOUS

## 2015-07-26 MED ORDER — KETOROLAC TROMETHAMINE 30 MG/ML IJ SOLN
INTRAMUSCULAR | Status: AC
  Filled 2015-07-26: qty 1

## 2015-07-26 MED ORDER — IBUPROFEN 800 MG PO TABS: 800.0000 mg | ORAL_TABLET | Freq: Three times a day (TID) | ORAL | Status: DC | PRN

## 2015-07-26 MED ORDER — SCOPOLAMINE 1 MG/3DAYS TD PT72
1.0000 | MEDICATED_PATCH | Freq: Once | TRANSDERMAL | Status: DC
  Administered 2015-07-26: 1.5 mg via TRANSDERMAL

## 2015-07-26 MED ORDER — LIDOCAINE HCL (CARDIAC) 20 MG/ML IV SOLN
INTRAVENOUS | Status: AC
  Filled 2015-07-26: qty 5

## 2015-07-26 MED ORDER — SILVER NITRATE-POT NITRATE 75-25 % EX MISC
CUTANEOUS | Status: DC | PRN
  Administered 2015-07-26: 1

## 2015-07-26 MED ORDER — LACTATED RINGERS IV SOLN
INTRAVENOUS | Status: DC
  Administered 2015-07-26: 125 mL/h via INTRAVENOUS

## 2015-07-26 MED ORDER — LIDOCAINE HCL (CARDIAC) 20 MG/ML IV SOLN
INTRAVENOUS | Status: DC | PRN
  Administered 2015-07-26: 20 mg via INTRATRACHEAL

## 2015-07-26 MED ORDER — HYDROCODONE-ACETAMINOPHEN 7.5-325 MG PO TABS
1.0000 | ORAL_TABLET | Freq: Once | ORAL | Status: DC | PRN
Start: 2015-07-26 — End: 2015-07-26

## 2015-07-26 MED ORDER — MEPERIDINE HCL 25 MG/ML IJ SOLN: 6.2500 mg | INTRAMUSCULAR | Status: DC | PRN

## 2015-07-26 MED ORDER — ONDANSETRON HCL 4 MG/2ML IJ SOLN
INTRAMUSCULAR | Status: DC | PRN
  Administered 2015-07-26: 4 mg via INTRAVENOUS

## 2015-07-26 MED ORDER — DEXAMETHASONE SODIUM PHOSPHATE 10 MG/ML IJ SOLN
INTRAMUSCULAR | Status: AC
  Filled 2015-07-26: qty 1

## 2015-07-26 MED ORDER — PROPOFOL 500 MG/50ML IV EMUL
INTRAVENOUS | Status: DC | PRN
  Administered 2015-07-26: 150 ug/kg/min via INTRAVENOUS

## 2015-07-26 MED ORDER — LIDOCAINE HCL 1 % IJ SOLN
INTRAMUSCULAR | Status: DC | PRN
  Administered 2015-07-26: 20 mL

## 2015-07-26 MED ORDER — FENTANYL CITRATE (PF) 100 MCG/2ML IJ SOLN
25.0000 ug | INTRAMUSCULAR | Status: DC | PRN
  Administered 2015-07-26: 50 ug via INTRAVENOUS

## 2015-07-26 MED ORDER — ONDANSETRON HCL 4 MG/2ML IJ SOLN: 4.0000 mg | Freq: Once | INTRAMUSCULAR | Status: DC | PRN

## 2015-07-26 MED ORDER — ONDANSETRON HCL 4 MG/2ML IJ SOLN
INTRAMUSCULAR | Status: AC
  Filled 2015-07-26: qty 2

## 2015-07-26 MED ORDER — KETOROLAC TROMETHAMINE 30 MG/ML IJ SOLN: 30.0000 mg | Freq: Once | INTRAMUSCULAR | Status: AC

## 2015-07-26 MED ORDER — MIDAZOLAM HCL 5 MG/5ML IJ SOLN
INTRAMUSCULAR | Status: DC | PRN
  Administered 2015-07-26: 2 mg via INTRAVENOUS

## 2015-07-26 MED ORDER — LIDOCAINE HCL 1 % IJ SOLN
INTRAMUSCULAR | Status: AC
  Filled 2015-07-26: qty 20

## 2015-07-26 SURGICAL SUPPLY — 20 items
CATH ROBINSON RED A/P 16FR (CATHETERS) ×2 IMPLANT
CLOTH BEACON ORANGE TIMEOUT ST (SAFETY) ×2 IMPLANT
DECANTER SPIKE VIAL GLASS SM (MISCELLANEOUS) ×2 IMPLANT
GLOVE BIO SURGEON STRL SZ8 (GLOVE) ×4 IMPLANT
GLOVE BIOGEL PI IND STRL 7.0 (GLOVE) ×1 IMPLANT
GLOVE BIOGEL PI INDICATOR 7.0 (GLOVE) ×1
GOWN STRL REUS W/TWL LRG LVL3 (GOWN DISPOSABLE) ×4 IMPLANT
KIT BERKELEY 1ST TRIMESTER 3/8 (MISCELLANEOUS) ×2 IMPLANT
NEEDLE HYPO 22GX1.5 SAFETY (NEEDLE) ×2 IMPLANT
NS IRRIG 1000ML POUR BTL (IV SOLUTION) ×2 IMPLANT
PACK VAGINAL MINOR WOMEN LF (CUSTOM PROCEDURE TRAY) ×2 IMPLANT
PAD OB MATERNITY 4.3X12.25 (PERSONAL CARE ITEMS) ×2 IMPLANT
PAD PREP 24X48 CUFFED NSTRL (MISCELLANEOUS) ×2 IMPLANT
SET BERKELEY SUCTION TUBING (SUCTIONS) ×2 IMPLANT
TOWEL OR 17X24 6PK STRL BLUE (TOWEL DISPOSABLE) ×4 IMPLANT
VACURETTE 10 RIGID CVD (CANNULA) IMPLANT
VACURETTE 12 RIGID CVD (CANNULA) ×2 IMPLANT
VACURETTE 7MM CVD STRL WRAP (CANNULA) IMPLANT
VACURETTE 8 RIGID CVD (CANNULA) IMPLANT
VACURETTE 9 RIGID CVD (CANNULA) IMPLANT

## 2015-07-26 NOTE — Anesthesia Postprocedure Evaluation (Signed)
Anesthesia Post Note  Patient: Tracey [name]  Procedure(s) Performed: Procedure(s) (LRB): DILATATION AND EVACUATION (N/A)  Patient location during evaluation: PACU Anesthesia Type: MAC Level of consciousness: awake, sedated and patient cooperative Pain management: pain level controlled Vital Signs Assessment: post-procedure vital signs reviewed and stable Respiratory status: spontaneous breathing Cardiovascular status: stable Postop Assessment: no signs of nausea or vomiting Anesthetic complications: no     Last Vitals:  Filed Vitals:   07/26/15 0815 07/26/15 0830  BP: 132/69 134/74  Pulse: 73 59  Temp:    Resp: 20 15    Last Pain:  Filed Vitals:   07/26/15 0836  PainSc: 0-No pain   Pain Goal: Patients Stated Pain Goal: 5 (07/26/15 0620)               Esau Fridman JR,JOHN Susann GivensFRANKLIN

## 2015-07-26 NOTE — Op Note (Signed)
Preoperative diagnosis:10 week IUFD  Postoperative diagnosis: Same  Procedure: Suction dilatation and curretage Surgeon: Ylianna Almanzar A  Anesthesia:  MAC with Paracervical block  Estimated blood loss: 50ml  Urine output: per anesthesia  IV Fluids: per anesthesia  Complications: None  Specimen: PATHOLOGY  Operative Findings: Products of Conception  Procedure:  Suction D&E  Description of procedure:   The patient was taken to the operating room and placed on the operating table in the semi-lithotomy position in [place] stirrups.  Examination under anesthesia was performed.  The patient was prepped and draped in the usual manner.  After a time-out had been completed, a speculum was placed in the vagina.  The anterior lip of the cervix was grasped with a single-toothed tenaculum.  A paracervical block was performed using 10 ml of 1% lidocaine.  The block was performed at 4 and 8 o'clock at the cervical vaginal junction. The cervix was dilated with Shawnie PonsPratt dilators.  A 9 -mm suction curet was inserted in the uterine cavity.  The device was activated and the curet rotated to evacuate the products of conception.   All the instruments were removed from the vagina.  Final instrument counts were correct.  The patient was taken to the PACU in stable condition.

## 2015-07-26 NOTE — Transfer of Care (Signed)
Immediate Anesthesia Transfer of Care Note  Patient: Tracey [name]  Procedure(s) Performed: Procedure(s): DILATATION AND EVACUATION (N/A)  Patient Location: PACU  Anesthesia Type:MAC  Level of Consciousness: awake, alert  and oriented  Airway & Oxygen Therapy: Patient Spontanous Breathing  Post-op Assessment: Report given to RN and Post -op Vital signs reviewed and stable  Post vital signs: Reviewed and stable  Last Vitals:  Filed Vitals:   07/26/15 0620 07/26/15 0756  BP: 128/73   Pulse: 74 72  Temp: 36.7 C 37.3 C  Resp: 16     Last Pain: There were no vitals filed for this visit.    Patients Stated Pain Goal: 5 (07/26/15 [id number])  Complications: No apparent anesthesia complications

## 2015-07-26 NOTE — Discharge Instructions (Signed)

## 2015-07-26 NOTE — H&P (Signed)
Tracey [name] is an 40 y.o. female. Presents for D&E for 10 week IUFD.  Pertinent Gynecological History: Menses: flow is moderate Bleeding: none Contraception: none DES exposure: denies Blood transfusions: none Sexually transmitted diseases: no past history Previous GYN Procedures: none  Last mammogram: n/a Date: n/a Last pap: normal Date: 2016   Menstrual History: Menarche age: [age]  Patient's last menstrual period was 04/25/2015.    Past Medical History  Diagnosis Date  . Medical history non-contributory   . Bronchitis   . Depression   . Anxiety   . Panic attack 2017    diagnosed 3 months ago. no meds presently  . Reflux     did not filll prescription  . Headache     migraines  . Carpal tunnel syndrome on both sides     Past Surgical History  Procedure Laterality Date  . Breast biopsy  right     cyst    Family History  Problem Relation Age of Onset  . Hypertension Mother   . Diabetes Maternal Aunt   . Diabetes Maternal Uncle     Social History:  reports that she has been smoking Cigarettes.  She has been smoking about 0.50 packs per day. She does not have any smokeless tobacco history on file. She reports that she does not drink alcohol or use illicit drugs.  Allergies: No Known Allergies  Prescriptions prior to admission  Medication Sig Dispense Refill Last Dose  . oxyCODONE-acetaminophen (PERCOCET) 10-325 MG tablet Take 1 tablet by mouth every 4 (four) hours as needed for pain. 40 tablet 0 07/22/2015 at Unknown time  . Prenatal Vit-FePoly-FA-DHA (VITAFOL-ONE) 29-1-200 MG CAPS Take 1 capsule by mouth daily before breakfast. (Patient not taking: Reported on 07/21/2015) 90 capsule 3     Review of Systems  All other systems reviewed and are negative.   Height 5\' 7"  (1.702 m), weight 204 lb (92.534 kg), last menstrual period 04/25/2015. Physical Exam  Nursing note and vitals reviewed. Constitutional: She is oriented to person, place, and time. She appears  well-developed and well-nourished.  HENT:  Head: Normocephalic and atraumatic.  Eyes: Conjunctivae are normal. Pupils are equal, round, and reactive to light.  Neck: Normal range of motion. Neck supple.  Cardiovascular: Normal rate and regular rhythm.   Respiratory: Effort normal and breath sounds normal.  GI: Soft.  Genitourinary: Vagina normal and uterus normal.  Musculoskeletal: Normal range of motion.  Neurological: She is alert and oriented to person, place, and time.  Skin: Skin is warm and dry.  Psychiatric: She has a normal mood and affect. Her behavior is normal. Judgment and thought content normal.    No results found for this or any previous visit (from the past 24 hour(s)).  No results found.  Assessment/Plan: 10 week IUFD.  For Suction D&E.  HARPER,CHARLES A 07/26/2015, 6:18 AM

## 2015-07-27 ENCOUNTER — Encounter (HOSPITAL_COMMUNITY): Payer: Self-pay | Admitting: Obstetrics

## 2015-07-28 ENCOUNTER — Telehealth: Payer: Self-pay | Admitting: *Deleted

## 2015-07-28 NOTE — Telephone Encounter (Signed)
Call to patient to follow up after surgery. Patient states she is doing well in her surgical recovery. She and her partner are still devastated by the loss of the pregnancy and she feels that they may need council. She is not sleeping well and has not returned to work yet. Discussed Heartstrings program and she is going to reach out to her former [employer] manag[employer]ouncilor , Dr Lafayette Dragonarr. She will let us know when she makes an appointment. Discussed eating regularly and getting out of the house and doing things that she used to do that make her feel happy and active. She was advised that we will cover her disability until she comes to the office for her post op appointment- but her councilor will need to extend it after that.

## 2015-08-09 ENCOUNTER — Ambulatory Visit (INDEPENDENT_AMBULATORY_CARE_PROVIDER_SITE_OTHER): Payer: 59 | Admitting: Obstetrics

## 2015-08-09 ENCOUNTER — Other Ambulatory Visit: Payer: Self-pay | Admitting: Obstetrics

## 2015-08-09 VITALS — BP 138/81 | HR 92 | Wt 200.0 lb

## 2015-08-09 DIAGNOSIS — Z9889 Other specified postprocedural states: Secondary | ICD-10-CM

## 2015-08-09 DIAGNOSIS — IMO0002 Reserved for concepts with insufficient information to code with codable children: Secondary | ICD-10-CM

## 2015-08-09 DIAGNOSIS — Z3169 Encounter for other general counseling and advice on procreation: Secondary | ICD-10-CM

## 2015-08-10 ENCOUNTER — Encounter: Payer: Self-pay | Admitting: Obstetrics

## 2015-08-10 NOTE — Progress Notes (Signed)
Patient ID: Tracey [name], female   DOB: 14-Jun-1975, 40 y.o.   MRN: [id number]  Chief Complaint  Patient presents with  . Follow-up    Post op D&E    HPI Tracey [name] is a 40 y.o. female.  Presents for 2 week check up after D&E for IUFD.  No complaints.  HPI  Past Medical History  Diagnosis Date  . Medical history non-contributory   . Bronchitis   . Depression   . Anxiety   . Panic attack 2017    diagnosed 3 months ago. no meds presently  . Reflux     did not filll prescription  . Headache     migraines  . Carpal tunnel syndrome on both sides   . Family history of adverse reaction to anesthesia     mother had problems waking up from surgery.    Past Surgical History  Procedure Laterality Date  . Breast biopsy  right     cyst  . Dilation and evacuation N/A 07/26/2015    Procedure: DILATATION AND EVACUATION;  Surgeon: Brock Bad, MD;  Location: [facility];  Service: Gynecology;  Laterality: N/A;    Family History  Problem Relation Age of Onset  . Hypertension Mother   . Diabetes Maternal Aunt   . Diabetes Maternal Uncle     Social History Social History  Substance Use Topics  . Smoking status: Current Every Day Smoker -- 0.50 packs/day    Types: Cigarettes  . Smokeless tobacco: None     Comment: 2 cig/day  . Alcohol Use: No    No Known Allergies  Current Outpatient Prescriptions  Medication Sig Dispense Refill  . Prenatal Vit-FePoly-FA-DHA (VITAFOL-ONE) 29-1-200 MG CAPS Take 1 capsule by mouth daily before breakfast. 90 capsule 3  . ibuprofen (ADVIL,MOTRIN) 800 MG tablet Take 1 tablet (800 mg total) by mouth every 8 (eight) hours as needed. (Patient not taking: Reported on 08/09/2015) 30 tablet 5  . oxyCODONE-acetaminophen (PERCOCET) 10-325 MG tablet Take 1 tablet by mouth every 4 (four) hours as needed for pain. (Patient not taking: Reported on 08/09/2015) 40 tablet 0   No current facility-administered medications for this visit.    Review of  Systems Review of Systems Constitutional: negative for fatigue and weight loss Respiratory: negative for cough and wheezing Cardiovascular: negative for chest pain, fatigue and palpitations Gastrointestinal: negative for abdominal pain and change in bowel habits Genitourinary:negative Integument/breast: negative for nipple discharge Musculoskeletal:negative for myalgias Neurological: negative for gait problems and tremors Behavioral/Psych: negative for abusive relationship, depression Endocrine: negative for temperature intolerance     Blood pressure 138/81, pulse 92, weight 200 lb (90.719 kg), last menstrual period 04/25/2015.  Physical Exam Physical Exam General:   alert  Skin:   no rash or abnormalities  Lungs:   clear to auscultation bilaterally  Heart:   regular rate and rhythm, S1, S2 normal, no murmur, click, rub or gallop  Breasts:   normal without suspicious masses, skin or nipple changes or axillary nodes  Abdomen:  normal findings: no organomegaly, soft, non-tender and no hernia  Pelvis:  External genitalia: normal general appearance Urinary system: urethral meatus normal and bladder without fullness, nontender Vaginal: normal without tenderness, induration or masses Cervix: normal appearance Adnexa: normal bimanual exam Uterus: anteverted and non-tender, normal size      Data Reviewed Pathology  Assessment     Post op check after D&E  Preconception counseling and advice    Plan    F/U 6 months  No orders of the defined types were placed in this encounter.   No orders of the defined types were placed in this encounter.

## 2015-08-13 ENCOUNTER — Telehealth: Payer: Self-pay

## 2015-08-13 NOTE — Telephone Encounter (Signed)
fmla paperwork ready charge is 15.00 - left vm for patient

## 2015-08-18 ENCOUNTER — Encounter: Payer: 59 | Admitting: Obstetrics

## 2016-02-09 ENCOUNTER — Ambulatory Visit (INDEPENDENT_AMBULATORY_CARE_PROVIDER_SITE_OTHER): Payer: 59 | Admitting: Family Medicine

## 2016-02-09 ENCOUNTER — Encounter: Payer: Self-pay | Admitting: Family Medicine

## 2016-02-09 VITALS — BP 164/98 | HR 71 | Wt 194.0 lb

## 2016-02-09 DIAGNOSIS — I10 Essential (primary) hypertension: Secondary | ICD-10-CM | POA: Diagnosis not present

## 2016-02-09 DIAGNOSIS — N979 Female infertility, unspecified: Secondary | ICD-10-CM

## 2016-02-09 DIAGNOSIS — O10919 Unspecified pre-existing hypertension complicating pregnancy, unspecified trimester: Secondary | ICD-10-CM | POA: Insufficient documentation

## 2016-02-09 DIAGNOSIS — F411 Generalized anxiety disorder: Secondary | ICD-10-CM | POA: Diagnosis not present

## 2016-02-09 NOTE — Patient Instructions (Addendum)
Preparing for Pregnancy If you are considering becoming pregnant, make an appointment to see your regular health care provider to learn how to prepare for a safe and healthy pregnancy (preconception care). During a preconception care visit, your health care provider will:  Do a complete physical exam, including a Pap test.  Take a complete medical history.  Give you information, answer your questions, and help you resolve problems.  Preconception checklist Medical history  Tell your health care provider about any current or past medical conditions. Your pregnancy or your ability to become pregnant may be affected by chronic conditions, such as diabetes, chronic hypertension, and thyroid problems.  Include your family's medical history as well as your partner's medical history.  Tell your health care provider about any history of STIs (sexually transmitted infections).These can affect your pregnancy. In some cases, they can be passed to your baby. Discuss any concerns that you have about STIs.  If indicated, discuss the benefits of genetic testing. This testing will show whether there are any genetic conditions that may be passed from you or your partner to your baby.  Tell your health care provider about: ? Any problems you have had with conception or pregnancy. ? Any medicines you take. These include vitamins, herbal supplements, and over-the-counter medicines. ? Your history of immunizations. Discuss any vaccinations that you may need.  Diet  Ask your health care provider what to include in a healthy diet that has a balance of nutrients. This is especially important when you are pregnant or preparing to become pregnant.  Ask your health care provider to help you reach a healthy weight before pregnancy. ? If you are overweight, you may be at higher risk for certain complications, such as high blood pressure, diabetes, and preterm birth. ? If you are underweight, you are more likely  to have a baby who has a low birth weight.  Lifestyle, work, and home  Let your health care provider know: ? About any lifestyle habits that you have, such as alcohol use, drug use, or smoking. ? About recreational activities that may put you at risk during pregnancy, such as downhill skiing and certain exercise programs. ? Tell your health care provider about any international travel, especially any travel to places with an active Zika virus outbreak. ? About harmful substances that you may be exposed to at work or at home. These include chemicals, pesticides, radiation, or even litter boxes. ? If you do not feel safe at home.  Mental health  Tell your health care provider about: ? Any history of mental health conditions, including feelings of depression, sadness, or anxiety. ? Any medicines that you take for a mental health condition. These include herbs and supplements.  Home instructions to prepare for pregnancy Lifestyle  Eat a balanced diet. This includes fresh fruits and vegetables, whole grains, lean meats, low-fat dairy products, healthy fats, and foods that are high in fiber. Ask to meet with a nutritionist or registered dietitian for assistance with meal planning and goals.  Get regular exercise. Try to be active for at least 30 minutes a day on most days of the week. Ask your health care provider which activities are safe during pregnancy.  Do not use any products that contain nicotine or tobacco, such as cigarettes and e-cigarettes. If you need help quitting, ask your health care provider.  Do not drink alcohol.  Do not take illegal drugs.  Maintain a healthy weight. Ask your health care provider what weight range is   instructions  Keep an accurate record of your menstrual periods. This makes it easier for your health care provider to determine your baby's due date.  Begin taking prenatal vitamins and folic acid supplements daily as directed by your  health care provider.  Manage any chronic conditions, such as high blood pressure and diabetes, as told by your health care provider. This is important. How do I know that I am pregnant? You may be pregnant if you have been sexually active and you miss your period. Symptoms of early pregnancy include:  Mild cramping.  Very light vaginal bleeding (spotting).  Feeling unusually tired.  Nausea and vomiting (morning sickness). If you have any of these symptoms and you suspect that you might be pregnant, you can take a home pregnancy test. These tests check for a hormone in your urine (human chorionic gonadotropin, or hCG). A woman's body begins to make this hormone during early pregnancy. These tests are very accurate. Wait until at least the first day after you miss your period to take one. If the test shows that you are pregnant (you get a positive result), call your health care provider to make an appointment for prenatal care. What should I do if I become pregnant?  Make an appointment with your health care provider as soon as you suspect you are pregnant.  Do not use any products that contain nicotine, such as cigarettes, chewing tobacco, and e-cigarettes. If you need help quitting, ask your health care provider.  Do not drink alcoholic beverages. Alcohol is related to a number of birth defects.  Avoid toxic odors and chemicals.  You may continue to have sexual intercourse if it does not cause pain or other problems, such as vaginal bleeding. This information is not intended to replace advice given to you by your health care provider. Make sure you discuss any questions you have with your health care provider. Document Released: 02/17/2008 Document Revised: 11/02/2015 Document Reviewed: 09/26/2015 Elsevier Interactive Patient Education  2017 [employer]vier Inc. DASH Eating Plan DASH stands for "Dietary Approaches to Stop Hypertension." The DASH eating plan is a healthy eating plan that has  been shown to reduce high blood pressure (hypertension). Additional health benefits may include reducing the risk of type 2 diabetes mellitus, heart disease, and stroke. The DASH eating plan may also help with weight loss. What do I need to know about the DASH eating plan? For the DASH eating plan, you will follow these general guidelines:  Choose foods with less than 150 milligrams of sodium per serving (as listed on the food label).  Use salt-free seasonings or herbs instead of table salt or sea salt.  Check with your health care provider or pharmacist before using salt substitutes.  Eat lower-sodium products. These are often labeled as "low-sodium" or "no salt added."  Eat fresh foods. Avoid eating a lot of canned foods.  Eat more vegetables, fruits, and low-fat dairy products.  Choose whole grains. Look for the word "whole" as the first word in the ingredient list.  Choose fish and skinless chicken or [place] more often than red meat. Limit fish, poultry, and meat to 6 oz (170 g) each day.  Limit sweets, desserts, sugars, and sugary drinks.  Choose heart-healthy fats.  Eat more home-cooked food and less restaurant, buffet, and fast food.  Limit fried foods.  Do not fry foods. Cook foods using methods such as baking, boiling, grilling, and broiling instead.  When eating at a restaurant, ask that your food be prepared  with less salt, or no salt if possible. What foods can I eat? Seek help from a dietitian for individual calorie needs. Grains  Whole grain or whole wheat bread. Brown rice. Whole grain or whole wheat pasta. Quinoa, bulgur, and whole grain cereals. Low-sodium cereals. Corn or whole wheat flour tortillas. Whole grain cornbread. Whole grain crackers. Low-sodium crackers. Vegetables  Fresh or frozen vegetables (raw, steamed, roasted, or grilled). Low-sodium or reduced-sodium tomato and vegetable juices. Low-sodium or reduced-sodium tomato sauce and paste. Low-sodium or  reduced-sodium canned vegetables. Fruits  All fresh, canned (in natural juice), or frozen fruits. Meat and Other Protein Products  Ground beef (85% or leaner), grass-fed beef, or beef trimmed of fat. Skinless chicken or [place]. Ground chicken or [place]. Pork trimmed of fat. All fish and seafood. Eggs. Dried beans, peas, or lentils. Unsalted nuts and seeds. Unsalted canned beans. Dairy  Low-fat dairy products, such as skim or 1% milk, 2% or reduced-fat cheeses, low-fat ricotta or cottage cheese, or plain low-fat yogurt. Low-sodium or reduced-sodium cheeses. Fats and Oils  Tub margarines without trans fats. Light or reduced-fat mayonnaise and salad dressings (reduced sodium). Avocado. Safflower, olive, or canola oils. Natural peanut or almond butter. Other  Unsalted popcorn and pretzels. The items listed above may not be a complete list of recommended foods or beverages. Contact your dietitian for more options.  What foods are not recommended? Grains  White bread. White pasta. White rice. Refined cornbread. Bagels and croissants. Crackers that contain trans fat. Vegetables  Creamed or fried vegetables. Vegetables in a cheese sauce. Regular canned vegetables. Regular canned tomato sauce and paste. Regular tomato and vegetable juices. Fruits  Canned fruit in light or heavy syrup. Fruit juice. Meat and Other Protein Products  Fatty cuts of meat. Ribs, chicken wings, bacon, sausage, bologna, salami, chitterlings, fatback, hot dogs, bratwurst, and packaged luncheon meats. Salted nuts and seeds. Canned beans with salt. Dairy  Whole or 2% milk, cream, half-and-half, and cream cheese. Whole-fat or sweetened yogurt. Full-fat cheeses or blue cheese. Nondairy creamers and whipped toppings. Processed cheese, cheese spreads, or cheese curds. Condiments  Onion and garlic salt, seasoned salt, table salt, and sea salt. Canned and packaged gravies. Worcestershire sauce. Tartar sauce. Barbecue sauce. Teriyaki  sauce. Soy sauce, including reduced sodium. Steak sauce. Fish sauce. Oyster sauce. Cocktail sauce. Horseradish. Ketchup and mustard. Meat flavorings and tenderizers. Bouillon cubes. Hot sauce. Tabasco sauce. Marinades. Taco seasonings. Relishes. Fats and Oils  Butter, stick margarine, lard, shortening, ghee, and bacon fat. Coconut, palm kernel, or palm oils. Regular salad dressings. Other  Pickles and olives. Salted popcorn and pretzels. The items listed above may not be a complete list of foods and beverages to avoid. Contact your dietitian for more information.  Where can I find more information? National Heart, Lung, and Blood Institute: CablePromo.itwww.nhlbi.nih.gov/health/health-topics/topics/dash/ This information is not intended to replace advice given to you by your health care provider. Make sure you discuss any questions you have with your health care provider. Document Released: 02/23/2011 Document Revised: 08/12/2015 Document Reviewed: 01/08/2013 Elsevier Interactive Patient Education  2017 [employer]

## 2016-02-09 NOTE — Assessment & Plan Note (Signed)
Gets pregnant--check labs, referral to [facility] given age--consider semen analysis

## 2016-02-09 NOTE — Assessment & Plan Note (Signed)
Try mindfulness--not helping with BP or fertility

## 2016-02-09 NOTE — Progress Notes (Signed)
   Subjective:    Patient ID: Tracey [name] is a 40 y.o. female presenting with Follow-up (would like to discuss fertility, pt had D&E in May. pt has been actively trying to concieve since procedure. pt needs PNV Rx.  pt would like upt today.) on 02/09/2016  HPI: Patient had a SAB in May and now wants to conceive again. Patient is [id number] with SVD x 2, last term pregnancy with IUGr 3lb 12 oz 10 years ago with 2 previous SAB. 4 years of trying to get pregnant prior to this miscarriage. Since miscarriage, has not conceived.  Reports regular cycles q 25 days, which are short. LMP 11/8. Uses an APP called glow to try to get pregnant. Has ovulation prediction kit, but has not used it. Seems quite anxious about trying to conceive.   Review of Systems  Constitutional: Negative for chills and fever.  Respiratory: Negative for shortness of breath.   Cardiovascular: Negative for chest pain.  Gastrointestinal: Negative for abdominal pain, nausea and vomiting.  Genitourinary: Negative for dysuria.  Skin: Negative for rash.  Psychiatric/Behavioral:       Anxiety      Objective:    BP (!) 164/98 (BP Location: Right Arm, Patient Position: Sitting) Comment (BP Location): manual  Pulse 71   Wt 194 lb (88 kg)   LMP 01/26/2016   Breastfeeding? Unknown   BMI 30.38 kg/m  Physical Exam  Constitutional: She is oriented to person, place, and time. She appears well-developed and well-nourished. No distress.  HENT:  Head: Normocephalic and atraumatic.  Eyes: No scleral icterus.  Neck: Normal range of motion. Neck supple.  Cardiovascular: Normal rate.   Pulmonary/Chest: Effort normal.  Abdominal: Soft.  Neurological: She is alert and oriented to person, place, and time.        Assessment & Plan:   Problem List Items Addressed This Visit      Unprioritized   Infertility, female - Primary    Gets pregnant--check labs, referral to REI given age--consider semen analysis      Relevant Orders   TSH   Follicle stimulating hormone   Ambulatory referral to Endocrinology   Anti mullerian hormone   High blood pressure    DASH diet--decrease anxiety, BP meds prn      Anxiety, generalized    Try mindfulness--not helping with BP or fertility         Total face-to-face time with patient: 25 minutes. Over 50% of encounter was spent on counseling and coordination of care.  Donnamae Jude 02/09/2016 3:25 PM

## 2016-02-09 NOTE — Assessment & Plan Note (Signed)
DASH diet--decrease anxiety, BP meds prn

## 2016-02-16 ENCOUNTER — Ambulatory Visit: Payer: 59

## 2017-03-20 NOTE — L&D Delivery Note (Signed)
Patient: Tracey [name] MRN: [id number]  GBS status: Positive, IAP given PenG x3  Patient is a 42 y.o. now [id number] s/p NSVD at [redacted]w[redacted]d, who was admitted for PROM @0110  11/27/17. PROM 20h 88m prior to delivery with clear fluid.    Delivery Note At 8:30 PM a healthy female was delivered via Vaginal, Spontaneous (Presentation: Vertex; ROA ).  APGAR: 8, 9; weight 5 lb 0.6 oz (2285 g).   Placenta status: spontaneous, intact. 3-vessel Cord.  Anesthesia:  epidural Episiotomy: None Lacerations: None Suture Repair: none Est. Blood Loss (mL): 100  Mom to postpartum.  Baby to Couplet care / Skin to Skin.  Denzil Hughes 11/27/2017, 10:48 PM     Head delivered ROA. No nuchal cord present. Shoulder and body delivered in usual fashion. Infant with spontaneous cry, placed on mother's abdomen, dried and bulb suctioned. Cord clamped x 2 after 1-minute delay, and cut by family member. Cord blood drawn. Placenta delivered spontaneously with gentle cord traction. Fundus firm with massage and Pitocin. Perineum inspected and found to have abrasion in sulcus which was found to be hemostatic.

## 2017-04-17 ENCOUNTER — Ambulatory Visit (INDEPENDENT_AMBULATORY_CARE_PROVIDER_SITE_OTHER): Payer: 59

## 2017-04-17 DIAGNOSIS — Z3201 Encounter for pregnancy test, result positive: Secondary | ICD-10-CM | POA: Diagnosis not present

## 2017-04-17 DIAGNOSIS — N912 Amenorrhea, unspecified: Secondary | ICD-10-CM

## 2017-04-17 LAB — POCT URINE PREGNANCY: Preg Test, Ur: POSITIVE — AB

## 2017-04-17 NOTE — Progress Notes (Signed)
Pt here for pregnancy test. Test today is positive. LMP 03/17/17. Pt is 6812w3d. EDD 12/22/17

## 2017-05-07 ENCOUNTER — Other Ambulatory Visit: Payer: Self-pay

## 2017-05-07 DIAGNOSIS — O09521 Supervision of elderly multigravida, first trimester: Secondary | ICD-10-CM

## 2017-05-07 MED ORDER — VITAFOL-ONE 29-1-200 MG PO CAPS: 1.0000 | ORAL_CAPSULE | Freq: Every day | ORAL | 3 refills | Status: DC

## 2017-05-10 ENCOUNTER — Telehealth: Payer: Self-pay | Admitting: *Deleted

## 2017-05-10 NOTE — Telephone Encounter (Signed)
Pt called to office. LM with complaints of HA's and cramping,.  Attempt to return call.  No answer, LM on VM to call office if needed.

## 2017-05-18 ENCOUNTER — Inpatient Hospital Stay (HOSPITAL_COMMUNITY)
Admission: AD | Admit: 2017-05-18 | Discharge: 2017-05-19 | Disposition: A | Discharge status: Home / Self Care | Payer: 59 | Source: Ambulatory Visit | Attending: Family Medicine | Admitting: Family Medicine

## 2017-05-18 ENCOUNTER — Telehealth: Payer: Self-pay

## 2017-05-18 ENCOUNTER — Encounter (HOSPITAL_COMMUNITY): Payer: Self-pay | Admitting: Medical

## 2017-05-18 ENCOUNTER — Inpatient Hospital Stay (HOSPITAL_COMMUNITY): Payer: 59

## 2017-05-18 DIAGNOSIS — O99331 Smoking (tobacco) complicating pregnancy, first trimester: Secondary | ICD-10-CM | POA: Diagnosis not present

## 2017-05-18 DIAGNOSIS — R1031 Right lower quadrant pain: Secondary | ICD-10-CM | POA: Diagnosis present

## 2017-05-18 DIAGNOSIS — Z3A08 8 weeks gestation of pregnancy: Secondary | ICD-10-CM | POA: Diagnosis not present

## 2017-05-18 DIAGNOSIS — O26891 Other specified pregnancy related conditions, first trimester: Secondary | ICD-10-CM

## 2017-05-18 DIAGNOSIS — N760 Acute vaginitis: Secondary | ICD-10-CM

## 2017-05-18 DIAGNOSIS — M545 Low back pain: Secondary | ICD-10-CM | POA: Diagnosis not present

## 2017-05-18 DIAGNOSIS — B373 Candidiasis of vulva and vagina: Secondary | ICD-10-CM | POA: Diagnosis not present

## 2017-05-18 DIAGNOSIS — O26899 Other specified pregnancy related conditions, unspecified trimester: Secondary | ICD-10-CM

## 2017-05-18 DIAGNOSIS — O23591 Infection of other part of genital tract in pregnancy, first trimester: Secondary | ICD-10-CM | POA: Insufficient documentation

## 2017-05-18 DIAGNOSIS — B9689 Other specified bacterial agents as the cause of diseases classified elsewhere: Secondary | ICD-10-CM | POA: Insufficient documentation

## 2017-05-18 DIAGNOSIS — O98811 Other maternal infectious and parasitic diseases complicating pregnancy, first trimester: Secondary | ICD-10-CM | POA: Insufficient documentation

## 2017-05-18 DIAGNOSIS — F17210 Nicotine dependence, cigarettes, uncomplicated: Secondary | ICD-10-CM | POA: Insufficient documentation

## 2017-05-18 DIAGNOSIS — R109 Unspecified abdominal pain: Secondary | ICD-10-CM

## 2017-05-18 DIAGNOSIS — B3731 Acute candidiasis of vulva and vagina: Secondary | ICD-10-CM

## 2017-05-18 LAB — CBC WITH DIFFERENTIAL/PLATELET
BASOS PCT: 0 %
Basophils Absolute: 0 10*3/uL (ref 0.0–0.1)
Eosinophils Absolute: 0.1 10*3/uL (ref 0.0–0.7)
Eosinophils Relative: 1 %
HEMATOCRIT: 35 % — AB (ref 36.0–46.0)
HEMOGLOBIN: 11.9 g/dL — AB (ref 12.0–15.0)
Lymphocytes Relative: 32 %
Lymphs Abs: 3.6 10*3/uL (ref 0.7–4.0)
MCH: 29.2 pg (ref 26.0–34.0)
MCHC: 34 g/dL (ref 30.0–36.0)
MCV: 85.8 fL (ref 78.0–100.0)
Monocytes Absolute: 0.6 10*3/uL (ref 0.1–1.0)
Monocytes Relative: 5 %
NEUTROS ABS: 7.1 10*3/uL (ref 1.7–7.7)
NEUTROS PCT: 62 %
Platelets: 251 10*3/uL (ref 150–400)
RBC: 4.08 MIL/uL (ref 3.87–5.11)
RDW: 13.9 % (ref 11.5–15.5)
WBC: 11.4 10*3/uL — ABNORMAL HIGH (ref 4.0–10.5)

## 2017-05-18 LAB — URINALYSIS, ROUTINE W REFLEX MICROSCOPIC
Bilirubin Urine: NEGATIVE
Glucose, UA: NEGATIVE mg/dL
Hgb urine dipstick: NEGATIVE
Ketones, ur: NEGATIVE mg/dL
Nitrite: NEGATIVE
PH: 6 (ref 5.0–8.0)
PROTEIN: NEGATIVE mg/dL
Specific Gravity, Urine: 1.025 (ref 1.005–1.030)

## 2017-05-18 LAB — POCT PREGNANCY, URINE: Preg Test, Ur: POSITIVE — AB

## 2017-05-18 LAB — WET PREP, GENITAL
SPERM: NONE SEEN
TRICH WET PREP: NONE SEEN

## 2017-05-18 IMAGING — US US OB COMP LESS 14 WK
1 series · 15 of 19 positions shown · non-contrast
Comparison: None this pregnancy.

CLINICAL DATA: Pregnant patient in first-trimester pregnancy with
right-sided abdominal pain.

EXAM:
OBSTETRIC <14 WK ULTRASOUND
TECHNIQUE: Transabdominal ultrasound was performed for evaluation of the
gestation as well as the maternal uterus and adnexal regions.

[Series 1: us ob comp less 14 wk · 15 of 19 slices shown]
[im 1/19]
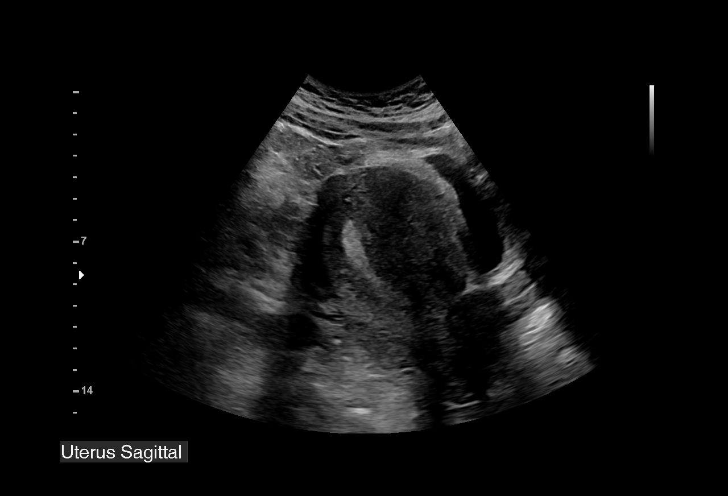
[im 2/19]
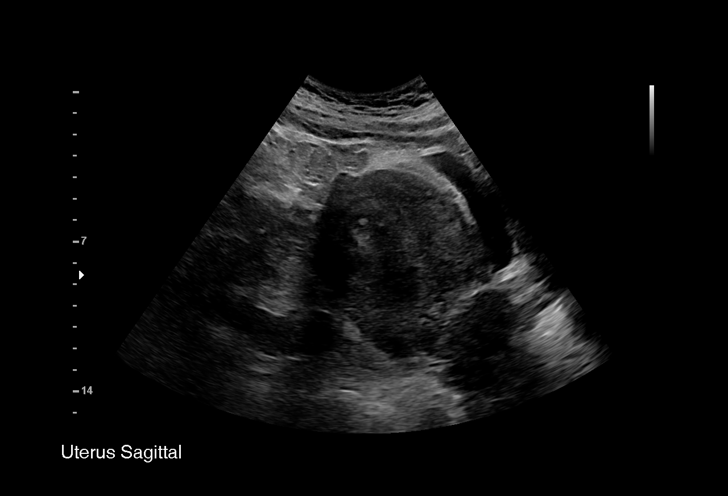
[im 4/19]
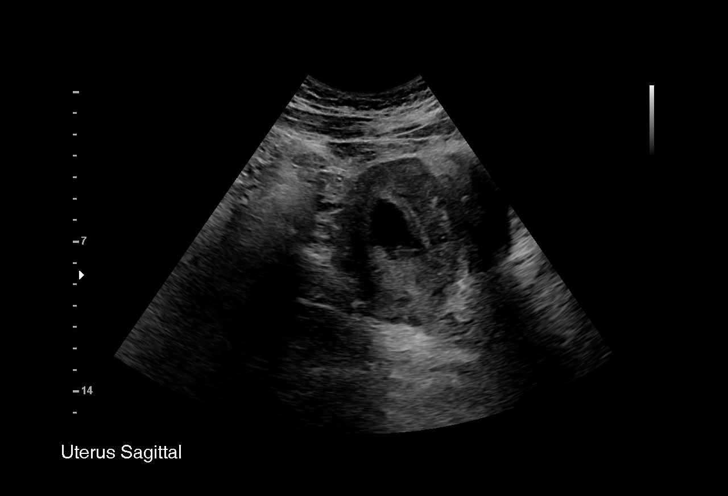
[im 5/19]
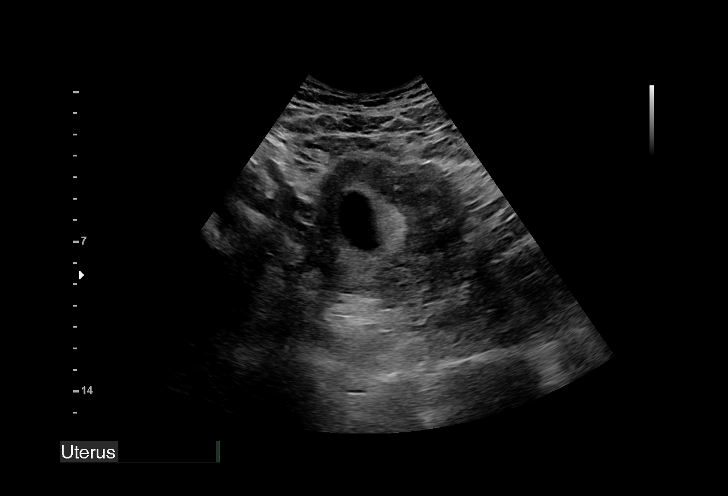
[im 6/19]
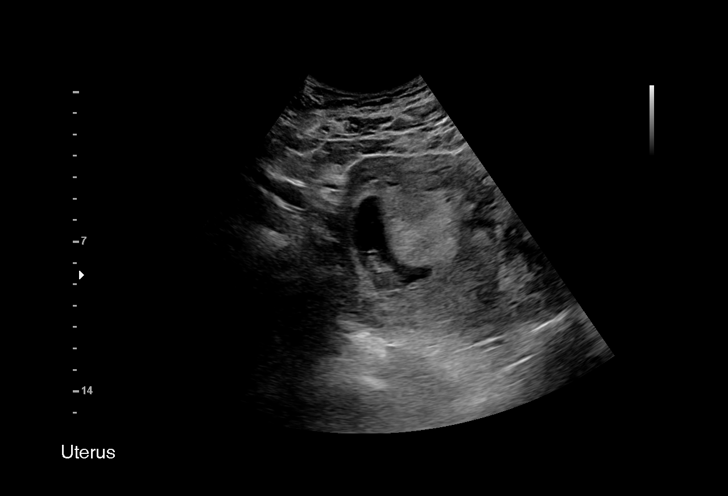
[im 7/19]
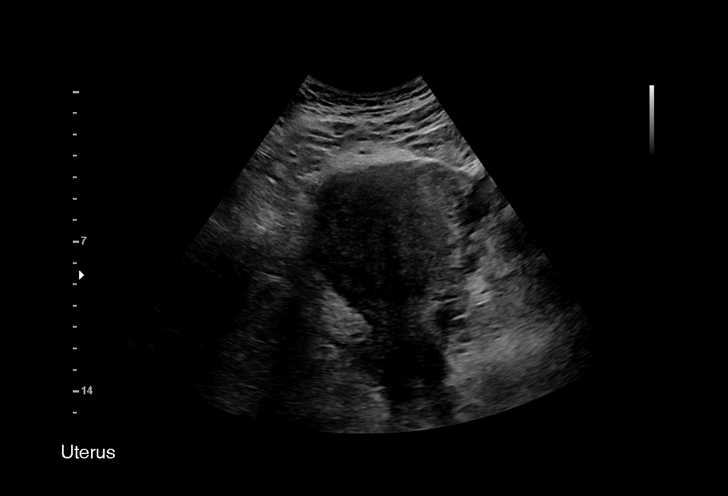
[im 9/19]
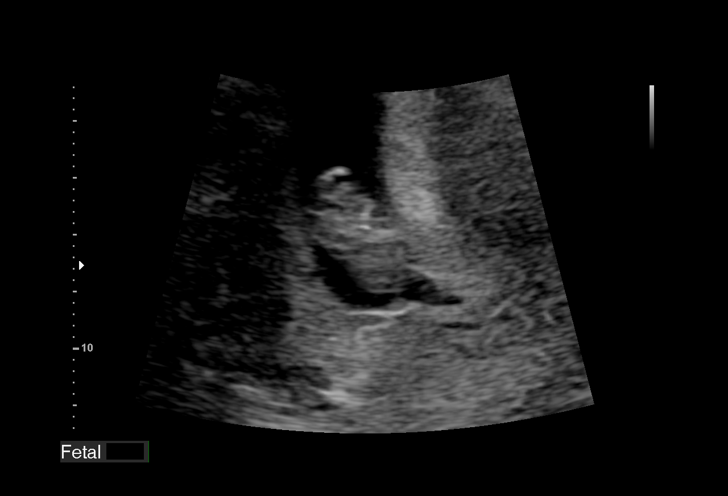
[im 10/19]
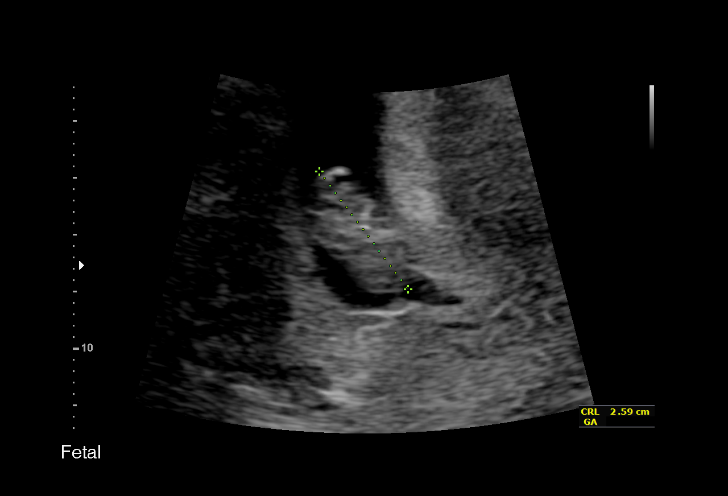
[im 11/19]
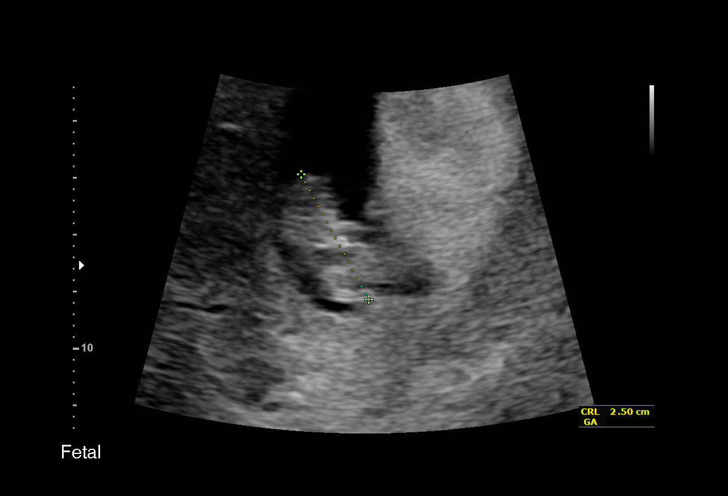
[im 13/19]
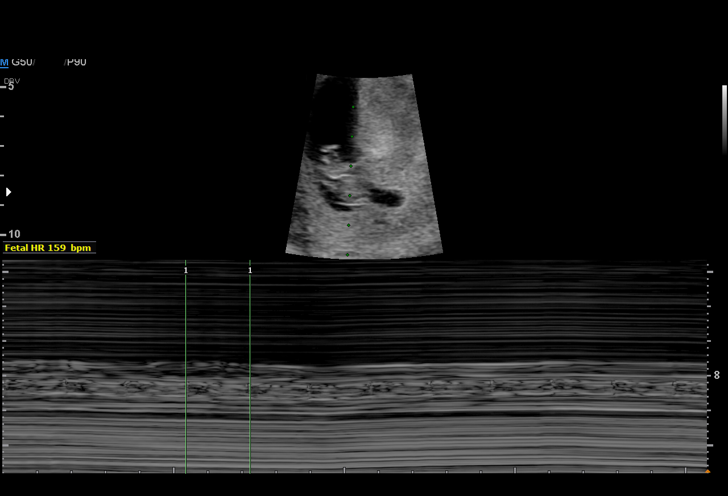
[im 14/19]
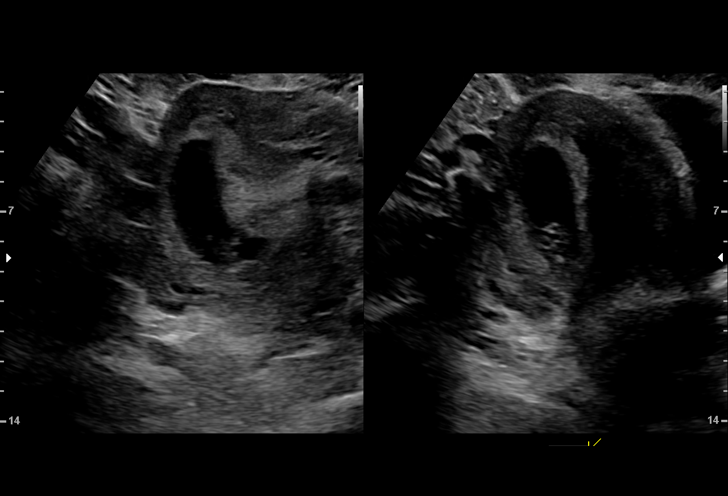
[im 15/19]
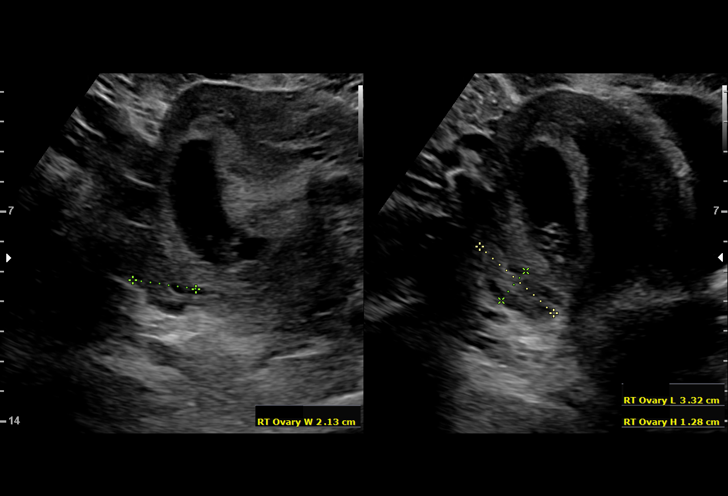
[im 16/19]
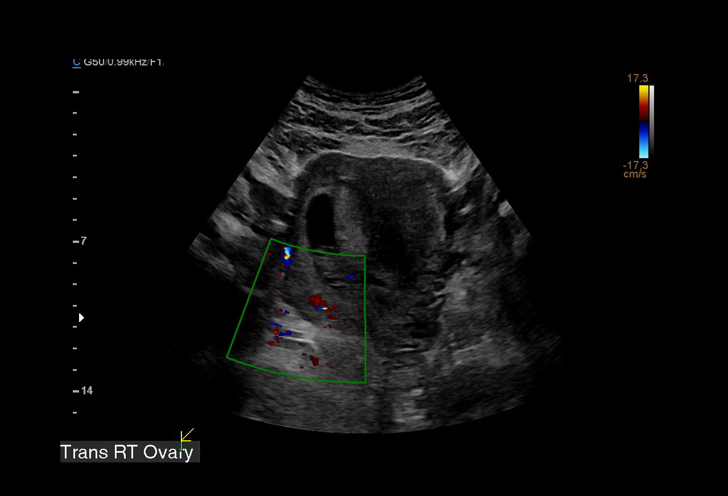
[im 18/19]
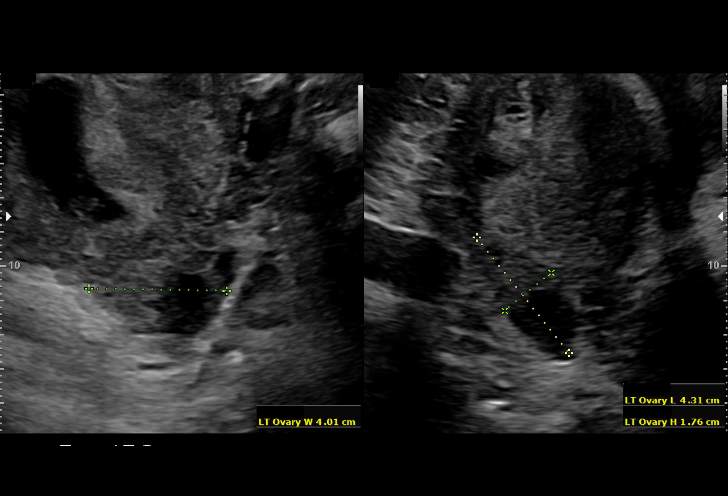
[im 19/19]
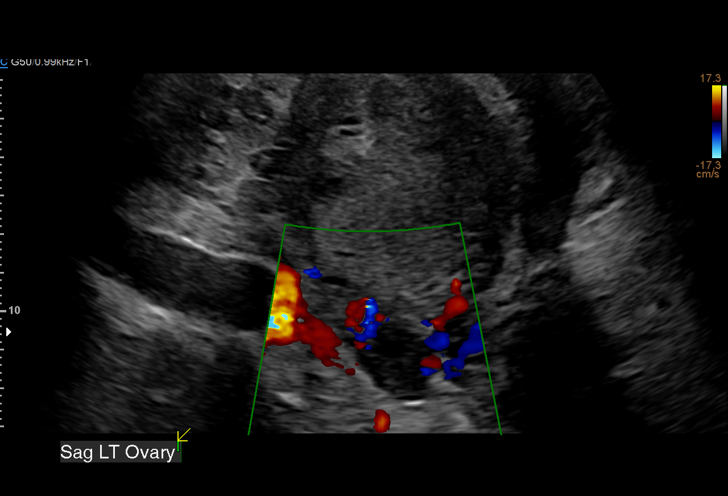

[15 of 19 positions shown; findings below may reference images not displayed]

FINDINGS: Intrauterine gestational sac: Single

Yolk sac:  Visualized, but likely resolving.

Embryo:  Visualized.

Cardiac Activity: Visualized.

Heart Rate: 159 bpm

CRL:   25.4 mm   9 w 1 d                  US EDC: 12/20/2017

Subchorionic hemorrhage:  None visualized.

Maternal uterus/adnexae: Both ovaries are visualized and normal.
Small corpus luteal cyst in the left ovary. No adnexal mass. No
pelvic free fluid.
IMPRESSION: Single live intrauterine gestation estimated gestational age 9 weeks
1 day based on crown-rump length. Estimated date of delivery
12/20/2017. No subchorionic hemorrhage.

## 2017-05-18 MED ORDER — TERCONAZOLE 0.8 % VA CREA: 1.0000 | TOPICAL_CREAM | Freq: Every day | VAGINAL | 0 refills | Status: DC

## 2017-05-18 MED ORDER — METRONIDAZOLE 0.75 % VA GEL: 1.0000 | Freq: Every day | VAGINAL | 0 refills | Status: DC

## 2017-05-18 NOTE — MAU Provider Note (Signed)
History     CSN: [id number]  Arrival date and time: 05/18/17 2231   First Provider Initiated Contact with Patient 05/18/17 2257      Chief Complaint  Patient presents with  . Back Pain   HPI  [name] is a 42 y.o. [id number] at 5212w6d who presents to MAU today with complaint of RLQ abdominal pain and low back pain. The patient denies vaginal bleeding, abnormal discharge, vomiting, diarrhea, UTI symptoms or fever. She has not taken anything for pain. She rates pain now at 6/10. She plans to return to [facility] for prenatal care.    OB History    Gravida Para Term Preterm AB Living   5 2 2  0 2 2   SAB TAB Ectopic Multiple Live Births   2 0 0 0 2      Past Medical History:  Diagnosis Date  . Anxiety   . Bronchitis   . Carpal tunnel syndrome on both sides   . Depression   . Family history of adverse reaction to anesthesia    mother had problems waking up from surgery.  . Headache    migraines  . Medical history non-contributory   . Panic attack 2017   diagnosed 3 months ago. no meds presently  . Reflux    did not filll prescription    Past Surgical History:  Procedure Laterality Date  . BREAST BIOPSY  right    cyst  . DILATION AND EVACUATION N/A 07/26/2015   Procedure: DILATATION AND EVACUATION;  Surgeon: Brock Badharles A Harper, MD;  Location: [facility];  Service: Gynecology;  Laterality: N/A;    Family History  Problem Relation Age of Onset  . Hypertension Mother   . Diabetes Maternal Aunt   . Diabetes Maternal Uncle     Social History   Tobacco Use  . Smoking status: Current Every Day Smoker    Packs/day: 0.25    Types: Cigarettes  . Tobacco comment: 2 cig/day  Substance Use Topics  . Alcohol use: No    Alcohol/week: 0.0 oz  . Drug use: No    Allergies: No Known Allergies  Medications Prior to Admission  Medication Sig Dispense Refill Last Dose  . Prenatal Vit-FePoly-FA-DHA (VITAFOL-ONE) 29-1-200 MG CAPS Take 1 capsule by mouth daily before breakfast.  90 capsule 3 05/18/2017 at Unknown time  . ibuprofen (ADVIL,MOTRIN) 800 MG tablet Take 1 tablet (800 mg total) by mouth every 8 (eight) hours as needed. (Patient not taking: Reported on 02/09/2016) 30 tablet 5 Not Taking    Review of Systems  Constitutional: Negative for fever.  Gastrointestinal: Positive for abdominal pain and nausea. Negative for constipation, diarrhea and vomiting.  Genitourinary: Positive for vaginal discharge. Negative for dysuria, frequency, urgency and vaginal bleeding.  Musculoskeletal: Positive for back pain.   Physical Exam   Blood pressure (!) 148/74, pulse 84, temperature 98.1 F (36.7 C), temperature source Oral, resp. rate 19, height 5\' 7"  (1.702 m), weight 216 lb (98 kg), last menstrual period 03/17/2017, unknown if currently breastfeeding.  Physical Exam  Nursing note and vitals reviewed. Constitutional: She is oriented to person, place, and time. She appears well-developed and well-nourished. No distress.  HENT:  Head: Normocephalic and atraumatic.  Cardiovascular: Normal rate.  Respiratory: Effort normal.  GI: Soft. She exhibits no distension and no mass. There is no tenderness. There is no rebound and no guarding.  Genitourinary: Uterus is not enlarged (exam limited by maternal body habitus). Cervix exhibits no motion tenderness. Right adnexum displays  no mass and no tenderness. Left adnexum displays no mass and no tenderness. No bleeding in the vagina. Vaginal discharge (small, thick, white) found.  Neurological: She is alert and oriented to person, place, and time.  Skin: Skin is warm and dry. No erythema.  Psychiatric: She has a normal mood and affect.    Results for orders placed or performed during the hospital encounter of 05/18/17 (from the past 24 hour(s))  Wet prep, genital     Status: Abnormal   Collection Time: 05/18/17 10:31 PM  Result Value Ref Range   Yeast Wet Prep HPF POC PRESENT (A) NONE SEEN   Trich, Wet Prep NONE SEEN NONE SEEN    Clue Cells Wet Prep HPF POC PRESENT (A) NONE SEEN   WBC, Wet Prep HPF POC MODERATE (A) NONE SEEN   Sperm NONE SEEN   Urinalysis, Routine w reflex microscopic     Status: Abnormal   Collection Time: 05/18/17 10:38 PM  Result Value Ref Range   Color, Urine YELLOW YELLOW   APPearance HAZY (A) CLEAR   Specific Gravity, Urine 1.025 1.005 - 1.030   pH 6.0 5.0 - 8.0   Glucose, UA NEGATIVE NEGATIVE mg/dL   Hgb urine dipstick NEGATIVE NEGATIVE   Bilirubin Urine NEGATIVE NEGATIVE   Ketones, ur NEGATIVE NEGATIVE mg/dL   Protein, ur NEGATIVE NEGATIVE mg/dL   Nitrite NEGATIVE NEGATIVE   Leukocytes, UA MODERATE (A) NEGATIVE   RBC / HPF 0-5 0 - 5 RBC/hpf   WBC, UA 0-5 0 - 5 WBC/hpf   Bacteria, UA RARE (A) NONE SEEN   Squamous Epithelial / LPF 0-5 (A) NONE SEEN   Mucus PRESENT   Pregnancy, urine POC     Status: Abnormal   Collection Time: 05/18/17 10:43 PM  Result Value Ref Range   Preg Test, Ur POSITIVE (A) NEGATIVE  CBC with Differential/Platelet     Status: Abnormal   Collection Time: 05/18/17 11:08 PM  Result Value Ref Range   WBC 11.4 (H) 4.0 - 10.5 K/uL   RBC 4.08 3.87 - 5.11 MIL/uL   Hemoglobin 11.9 (L) 12.0 - 15.0 g/dL   HCT 16.1 (L) 09.6 - 04.5 %   MCV 85.8 78.0 - 100.0 fL   MCH 29.2 26.0 - 34.0 pg   MCHC 34.0 30.0 - 36.0 g/dL   RDW 40.9 81.1 - 91.4 %   Platelets 251 150 - 400 K/uL   Neutrophils Relative % 62 %   Neutro Abs 7.1 1.7 - 7.7 K/uL   Lymphocytes Relative 32 %   Lymphs Abs 3.6 0.7 - 4.0 K/uL   Monocytes Relative 5 %   Monocytes Absolute 0.6 0.1 - 1.0 K/uL   Eosinophils Relative 1 %   Eosinophils Absolute 0.1 0.0 - 0.7 K/uL   Basophils Relative 0 %   Basophils Absolute 0.0 0.0 - 0.1 K/uL   US Ob Comp Less 14 Wks  Result Date: 05/18/2017 CLINICAL DATA:  Pregnant patient in first-trimester pregnancy with right-sided abdominal pain. EXAM: OBSTETRIC <14 WK ULTRASOUND TECHNIQUE: Transabdominal ultrasound was performed for evaluation of the gestation as well as the  maternal uterus and adnexal regions. COMPARISON:  None this pregnancy. FINDINGS: Intrauterine gestational sac: Single Yolk sac:  Visualized, but likely resolving. Embryo:  Visualized. Cardiac Activity: Visualized. Heart Rate: 159 bpm CRL:   25.4 mm   9 w 1 d                  [place] EDC: 12/20/2017 Subchorionic hemorrhage:  None  visualized. Maternal uterus/adnexae: Both ovaries are visualized and normal. Small corpus luteal cyst in the left ovary. No adnexal mass. No pelvic free fluid. IMPRESSION: Single live intrauterine gestation estimated gestational age [redacted] weeks 1 day based on crown-rump length. Estimated date of delivery 12/20/2017. No subchorionic hemorrhage. Electronically Signed   By: Rubye Oaks M.D.   On: 05/18/2017 23:57     MAU Course  Procedures None  MDM +UPT UA, wet prep, GC/chlamydia, CBC, quant hCG, HIV, RPR and [place] today to rule out ectopic pregnancy B+ blood type   Assessment and Plan  A: SIUP at [redacted]w[redacted]d Abdominal pain in pregnancy, first trimester Yeast vulvovaginitis Bacterial vaginosis   P: Discharge home Rx for Terazol and Metrogel given to patient  First trimester precautions discussed Patient advised to follow-up with [facility] as planned to start prenatal care Patient may return to MAU as needed or if her condition were to change or worsen  Vonzella Nipple, PA-C 05/19/2017, 12:02 AM

## 2017-05-18 NOTE — MAU Note (Signed)
Pain in the lower right side of her back that began today.  Started out dull and gets sharp when she sits.  No VB/discharge.  Last intercourse last night.  No urinary pain/frequency/pressure.

## 2017-05-18 NOTE — Discharge Instructions (Signed)
First Trimester of Pregnancy The first trimester of pregnancy is from week 1 until the end of week 13 (months 1 through 3). During this time, your baby will begin to develop inside you. At 6-8 weeks, the eyes and face are formed, and the heartbeat can be seen on ultrasound. At the end of 12 weeks, all the baby's organs are formed. Prenatal care is all the medical care you receive before the birth of your baby. Make sure you get good prenatal care and follow all of your doctor's instructions. Follow these instructions at home: Medicines  Take over-the-counter and prescription medicines only as told by your doctor. Some medicines are safe and some medicines are not safe during pregnancy.  Take a prenatal vitamin that contains at least 600 micrograms (mcg) of folic acid.  If you have trouble pooping (constipation), take medicine that will make your stool soft (stool softener) if your doctor approves. Eating and drinking  Eat regular, healthy meals.  Your doctor will tell you the amount of weight gain that is right for you.  Avoid raw meat and uncooked cheese.  If you feel sick to your stomach (nauseous) or throw up (vomit): ? Eat 4 or 5 small meals a day instead of 3 large meals. ? Try eating a few soda crackers. ? Drink liquids between meals instead of during meals.  To prevent constipation: ? Eat foods that are high in fiber, like fresh fruits and vegetables, whole grains, and beans. ? Drink enough fluids to keep your pee (urine) clear or pale yellow. Activity  Exercise only as told by your doctor. Stop exercising if you have cramps or pain in your lower belly (abdomen) or low back.  Do not exercise if it is too hot, too humid, or if you are in a place of great height (high altitude).  Try to avoid standing for long periods of time. Move your legs often if you must stand in one place for a long time.  Avoid heavy lifting.  Wear low-heeled shoes. Sit and stand up straight.  You  can have sex unless your doctor tells you not to. Relieving pain and discomfort  Wear a good support bra if your breasts are sore.  Take warm water baths (sitz baths) to soothe pain or discomfort caused by hemorrhoids. Use hemorrhoid cream if your doctor says it is okay.  Rest with your legs raised if you have leg cramps or low back pain.  If you have puffy, bulging veins (varicose veins) in your legs: ? Wear support hose or compression stockings as told by your doctor. ? Raise (elevate) your feet for 15 minutes, 3-4 times a day. ? Limit salt in your food. Prenatal care  Schedule your prenatal visits by the twelfth week of pregnancy.  Write down your questions. Take them to your prenatal visits.  Keep all your prenatal visits as told by your doctor. This is important. Safety  Wear your seat belt at all times when driving.  Make a list of emergency phone numbers. The list should include numbers for family, friends, the hospital, and police and fire departments. General instructions  Ask your doctor for a referral to a local prenatal class. Begin classes no later than at the start of month 6 of your pregnancy.  Ask for help if you need counseling or if you need help with nutrition. Your doctor can give you advice or tell you where to go for help.  Do not use hot tubs, steam rooms, or  saunas. °· Do not douche or use tampons or scented sanitary pads. °· Do not cross your legs for long periods of time. °· Avoid all herbs and alcohol. Avoid drugs that are not approved by your doctor. °· Do not use any tobacco products, including cigarettes, chewing tobacco, and electronic cigarettes. If you need help quitting, ask your doctor. You may get counseling or other support to help you quit. °· Avoid cat litter boxes and soil used by cats. These carry germs that can cause birth defects in the baby and can cause a loss of your baby (miscarriage) or stillbirth. °· Visit your dentist. At home, brush  your teeth with a soft toothbrush. Be gentle when you floss. °Contact a doctor if: °· You are dizzy. °· You have mild cramps or pressure in your lower belly. °· You have a nagging pain in your belly area. °· You continue to feel sick to your stomach, you throw up, or you have watery poop (diarrhea). °· You have a bad smelling fluid coming from your vagina. °· You have pain when you pee (urinate). °· You have increased puffiness (swelling) in your face, hands, legs, or ankles. °Get help right away if: °· You have a fever. °· You are leaking fluid from your vagina. °· You have spotting or bleeding from your vagina. °· You have very bad belly cramping or pain. °· You gain or lose weight rapidly. °· You throw up blood. It may look like coffee grounds. °· You are around people who have German measles, fifth disease, or chickenpox. °· You have a very bad headache. °· You have shortness of breath. °· You have any kind of trauma, such as from a fall or a car accident. °Summary °· The first trimester of pregnancy is from week 1 until the end of week 13 (months 1 through 3). °· To take care of yourself and your unborn baby, you will need to eat healthy meals, take medicines only if your doctor tells you to do so, and do activities that are safe for you and your baby. °· Keep all follow-up visits as told by your doctor. This is important as your doctor will have to ensure that your baby is healthy and growing well. °This information is not intended to replace advice given to you by your health care provider. Make sure you discuss any questions you have with your health care provider. °Document Released: 08/23/2007 Document Revised: 03/14/2016 Document Reviewed: 03/14/2016 °Elsevier Interactive Patient Education © 2017 Elsevier Inc. °Bacterial Vaginosis °Bacterial vaginosis is an infection of the vagina. It happens when too many germs (bacteria) grow in the vagina. This infection puts you at risk for infections from sex (STIs).  Treating this infection can lower your risk for some STIs. You should also treat this if you are pregnant. It can cause your baby to be born early. °Follow these instructions at home: °Medicines °· Take over-the-counter and prescription medicines only as told by your doctor. °· Take or use your antibiotic medicine as told by your doctor. Do not stop taking or using it even if you start to feel better. °General instructions °· If you your sexual partner is a woman, tell her that you have this infection. She needs to get treatment if she has symptoms. If you have a female partner, he does not need to be treated. °· During treatment: °? Avoid sex. °? Do not douche. °? Avoid alcohol as told. °? Avoid breastfeeding as told. °· Drink enough fluid   to keep your pee (urine) clear or pale yellow.  Keep your vagina and butt (rectum) clean. ? Wash the area with warm water every day. ? Wipe from front to back after you use the toilet.  Keep all follow-up visits as told by your doctor. This is important. Preventing this condition  Do not douche.  Use only warm water to wash around your vagina.  Use protection when you have sex. This includes: ? Latex condoms. ? Dental dams.  Limit how many people you have sex with. It is best to only have sex with the same person (be monogamous).  Get tested for STIs. Have your partner get tested.  Wear underwear that is cotton or lined with cotton.  Avoid tight pants and pantyhose. This is most important in summer.  Do not use any products that have nicotine or tobacco in them. These include cigarettes and e-cigarettes. If you need help quitting, ask your doctor.  Do not use illegal drugs.  Limit how much alcohol you drink. Contact a doctor if:  Your symptoms do not get better, even after you are treated.  You have more discharge or pain when you pee (urinate).  You have a fever.  You have pain in your belly (abdomen).  You have pain with sex.  Your bleed  from your vagina between periods. Summary  This infection happens when too many germs (bacteria) grow in the vagina.  Treating this condition can lower your risk for some infections from sex (STIs).  You should also treat this if you are pregnant. It can cause early (premature) birth.  Do not stop taking or using your antibiotic medicine even if you start to feel better. This information is not intended to replace advice given to you by your health care provider. Make sure you discuss any questions you have with your health care provider. Document Released: 12/14/2007 Document Revised: 11/20/2015 Document Reviewed: 11/20/2015 Elsevier Interactive Patient Education  2017 Elsevier Inc. Vaginal Yeast infection, Adult Vaginal yeast infection is a condition that causes soreness, swelling, and redness (inflammation) of the vagina. It also causes vaginal discharge. This is a common condition. Some women get this infection frequently. What are the causes? This condition is caused by a change in the normal balance of the yeast (candida) and bacteria that live in the vagina. This change causes an overgrowth of yeast, which causes the inflammation. What increases the risk? This condition is more likely to develop in:  Women who take antibiotic medicines.  Women who have diabetes.  Women who take birth control pills.  Women who are pregnant.  Women who douche often.  Women who have a weak defense (immune) system.  Women who have been taking steroid medicines for a long time.  Women who frequently wear tight clothing.  What are the signs or symptoms? Symptoms of this condition include:  White, thick vaginal discharge.  Swelling, itching, redness, and irritation of the vagina. The lips of the vagina (vulva) may be affected as well.  Pain or a burning feeling while urinating.  Pain during sex.  How is this diagnosed? This condition is diagnosed with a medical history and physical  exam. This will include a pelvic exam. Your health care provider will examine a sample of your vaginal discharge under a microscope. Your health care provider may send this sample for testing to confirm the diagnosis. How is this treated? This condition is treated with medicine. Medicines may be over-the-counter or prescription. You may be told to  use one or more of the following:  Medicine that is taken orally.  Medicine that is applied as a cream.  Medicine that is inserted directly into the vagina (suppository).  Follow these instructions at home:  Take or apply over-the-counter and prescription medicines only as told by your health care provider.  Do not have sex until your health care provider has approved. Tell your sex partner that you have a yeast infection. That person should go to his or her health care provider if he or she develops symptoms.  Do not wear tight clothes, such as pantyhose or tight pants.  Avoid using tampons until your health care provider approves.  Eat more yogurt. This may help to keep your yeast infection from returning.  Try taking a sitz bath to help with discomfort. This is a warm water bath that is taken while you are sitting down. The water should only come up to your hips and should cover your buttocks. Do this 3-4 times per day or as told by your health care provider.  Do not douche.  Wear breathable, cotton underwear.  If you have diabetes, keep your blood sugar levels under control. Contact a health care provider if:  You have a fever.  Your symptoms go away and then return.  Your symptoms do not get better with treatment.  Your symptoms get worse.  You have new symptoms.  You develop blisters in or around your vagina.  You have blood coming from your vagina and it is not your menstrual period.  You develop pain in your abdomen. This information is not intended to replace advice given to you by your health care provider. Make sure  you discuss any questions you have with your health care provider. Document Released: 12/14/2004 Document Revised: 08/18/2015 Document Reviewed: 09/07/2014 Elsevier Interactive Patient Education  2018 [employer].

## 2017-05-18 NOTE — Telephone Encounter (Signed)
Spoke with patient regarding her NOB visit 06/04/17 and the labs that will be done.

## 2017-05-19 LAB — HCG, QUANTITATIVE, PREGNANCY: hCG, Beta Chain, Quant, S: 60973 m[IU]/mL — ABNORMAL HIGH (ref <5)

## 2017-05-19 LAB — HIV ANTIBODY (ROUTINE TESTING W REFLEX): HIV Screen 4th Generation wRfx: NONREACTIVE

## 2017-05-19 LAB — RPR: RPR Ser Ql: NONREACTIVE

## 2017-05-21 LAB — GC/CHLAMYDIA PROBE AMP (~~LOC~~) NOT AT ARMC
Chlamydia: NEGATIVE
NEISSERIA GONORRHEA: NEGATIVE

## 2017-06-04 ENCOUNTER — Ambulatory Visit (INDEPENDENT_AMBULATORY_CARE_PROVIDER_SITE_OTHER): Payer: 59 | Admitting: Certified Nurse Midwife

## 2017-06-04 ENCOUNTER — Telehealth: Payer: Self-pay

## 2017-06-04 ENCOUNTER — Other Ambulatory Visit: Payer: Self-pay

## 2017-06-04 ENCOUNTER — Encounter: Payer: Self-pay | Admitting: Certified Nurse Midwife

## 2017-06-04 VITALS — BP 133/80 | HR 76 | Wt 213.4 lb

## 2017-06-04 DIAGNOSIS — Z1151 Encounter for screening for human papillomavirus (HPV): Secondary | ICD-10-CM

## 2017-06-04 DIAGNOSIS — O219 Vomiting of pregnancy, unspecified: Secondary | ICD-10-CM

## 2017-06-04 DIAGNOSIS — O10919 Unspecified pre-existing hypertension complicating pregnancy, unspecified trimester: Secondary | ICD-10-CM

## 2017-06-04 DIAGNOSIS — O0991 Supervision of high risk pregnancy, unspecified, first trimester: Secondary | ICD-10-CM

## 2017-06-04 DIAGNOSIS — O09299 Supervision of pregnancy with other poor reproductive or obstetric history, unspecified trimester: Secondary | ICD-10-CM

## 2017-06-04 DIAGNOSIS — B373 Candidiasis of vulva and vagina: Secondary | ICD-10-CM

## 2017-06-04 DIAGNOSIS — Z8759 Personal history of other complications of pregnancy, childbirth and the puerperium: Secondary | ICD-10-CM | POA: Insufficient documentation

## 2017-06-04 DIAGNOSIS — O09291 Supervision of pregnancy with other poor reproductive or obstetric history, first trimester: Secondary | ICD-10-CM

## 2017-06-04 DIAGNOSIS — N760 Acute vaginitis: Secondary | ICD-10-CM

## 2017-06-04 DIAGNOSIS — Z124 Encounter for screening for malignant neoplasm of cervix: Secondary | ICD-10-CM | POA: Diagnosis not present

## 2017-06-04 DIAGNOSIS — B3731 Acute candidiasis of vulva and vagina: Secondary | ICD-10-CM

## 2017-06-04 DIAGNOSIS — O10911 Unspecified pre-existing hypertension complicating pregnancy, first trimester: Secondary | ICD-10-CM

## 2017-06-04 DIAGNOSIS — B9689 Other specified bacterial agents as the cause of diseases classified elsewhere: Secondary | ICD-10-CM

## 2017-06-04 DIAGNOSIS — O0990 Supervision of high risk pregnancy, unspecified, unspecified trimester: Secondary | ICD-10-CM | POA: Insufficient documentation

## 2017-06-04 MED ORDER — TERCONAZOLE 0.8 % VA CREA: 1.0000 | TOPICAL_CREAM | Freq: Every day | VAGINAL | 0 refills | Status: DC

## 2017-06-04 MED ORDER — DOXYLAMINE-PYRIDOXINE ER 20-20 MG PO TBCR: 1.0000 | EXTENDED_RELEASE_TABLET | Freq: Two times a day (BID) | ORAL | 6 refills | Status: DC

## 2017-06-04 MED ORDER — ASPIRIN 81 MG PO CHEW: 81.0000 mg | CHEWABLE_TABLET | Freq: Every day | ORAL | 12 refills | Status: DC

## 2017-06-04 MED ORDER — METRONIDAZOLE 0.75 % VA GEL: 1.0000 | Freq: Two times a day (BID) | VAGINAL | 0 refills | Status: DC

## 2017-06-04 NOTE — Telephone Encounter (Signed)
TC from pt stating her Nausea Rx was not at the pharm w/ her other Rx Rx resent to correct pharm.

## 2017-06-04 NOTE — Progress Notes (Signed)
Subjective:   Tracey [name] is a 42 y.o. [id number] at 61w2dby early ultrasound being seen today for her first obstetrical visit.  Her obstetrical history is significant for advanced maternal age, obesity and hx of infertility (no treatment, natural pregnancy), CHTN. Patient does intend to breast feed. Pregnancy history fully reviewed.  Patient reports nausea, no bleeding, no contractions, no cramping, no leaking and vomiting.  HISTORY: Obstetric History   G5   P2   T2   P0   A2   L2    SAB2   TAB0   Ectopic0   Multiple0   Live Births2     # Outcome Date GA Lbr Len/2nd Weight Sex Delivery Anes PTL Lv  5 Current           4 SAB 08/09/15 174w0d       3 Term 03/23/05 3958w4d lb 12 oz (1.701 kg) F Vag-Spont   LIV     Complications: IUGR (intrauterine growth restriction) affecting care of mother  2 SAB 2004          1 Term 08/06/96   7 lb (3.175 kg) M Vag-Spont   LIV      Last pap smear was done: unknown  Past Medical History:  Diagnosis Date  . Anxiety   . Bronchitis   . Carpal tunnel syndrome on both sides   . Depression   . Family history of adverse reaction to anesthesia    mother had problems waking up from surgery.  . Headache    migraines  . Medical history non-contributory   . Panic attack 2017   diagnosed 3 months ago. no meds presently  . Reflux    did not filll prescription   Past Surgical History:  Procedure Laterality Date  . BREAST BIOPSY  right    cyst  . DILATION AND EVACUATION N/A 07/26/2015   Procedure: DILATATION AND EVACUATION;  Surgeon: ChaShelly BombardD;  Location: [facility];  Service: Gynecology;  Laterality: N/A;   Family History  Problem Relation Age of Onset  . Hypertension Mother   . Diabetes Maternal Aunt   . Diabetes Maternal Uncle    Social History   Tobacco Use  . Smoking status: Current Some Day Smoker    Packs/day: 0.25    Types: Cigarettes  . Smokeless tobacco: Never Used  . Tobacco comment: 2 cig/day  Substance Use Topics    . Alcohol use: No    Alcohol/week: 0.0 oz  . Drug use: No   No Known Allergies Current Outpatient Medications on File Prior to Visit  Medication Sig Dispense Refill  . Prenatal Vit-FePoly-FA-DHA (VITAFOL-ONE) 29-1-200 MG CAPS Take 1 capsule by mouth daily before breakfast. 90 capsule 3   No current facility-administered medications on file prior to visit.     Review of Systems Pertinent items noted in HPI and remainder of comprehensive ROS otherwise negative.  Exam   Vitals:   06/04/17 1018  BP: 133/80  Pulse: 76  Weight: 213 lb 6.4 oz (96.8 kg)   Fetal Heart Rate (bpm): 154; doppler  Uterus:     Pelvic Exam: Perineum: no hemorrhoids, normal perineum   Vulva: normal external genitalia, no lesions   Vagina:  normal mucosa, normal discharge   Cervix: no lesions and normal, pap smear done.    Adnexa: normal adnexa and no mass, fullness, tenderness   Bony Pelvis: average  System: General: well-developed, well-nourished female in no acute distress  Breast:  normal appearance, no masses or tenderness   Skin: normal coloration and turgor, no rashes   Neurologic: oriented, normal, negative, normal mood   Extremities: normal strength, tone, and muscle mass, ROM of all joints is normal   HEENT PERRLA, extraocular movement intact and sclera clear, anicteric   Mouth/Teeth mucous membranes moist, pharynx normal without lesions and dental hygiene good   Neck supple and no masses   Cardiovascular: regular rate and rhythm   Respiratory:  no respiratory distress, normal breath sounds   Abdomen: soft, non-tender; bowel sounds normal; no masses,  no organomegaly     Assessment:   Pregnancy: [id number] Patient Active Problem List   Diagnosis Date Noted  . Supervision of high risk pregnancy, antepartum 06/04/2017  . History of IUGR (intrauterine growth retardation) and stillbirth, currently pregnant 06/04/2017  . Infertility, female 02/09/2016  . Chronic hypertension during pregnancy,  antepartum 02/09/2016  . Anxiety, generalized 02/09/2016     Plan:  1. Supervision of high risk pregnancy, antepartum    - Cytology - PAP - VITAMIN D 25 Hydroxy (Vit-D Deficiency, Fractures) - Culture, OB Urine - Hemoglobinopathy evaluation - Obstetric Panel, Including HIV - Genetic Screening - Cystic Fibrosis Mutation 97 - Hemoglobin A1c - [place] MFM OB DETAIL +14 WK; Future  2. Chronic hypertension during pregnancy, antepartum    - Comp Met (CMET) - Protein / creatinine ratio, urine - aspirin 81 MG chewable tablet; Chew 1 tablet (81 mg total) by mouth daily.  Dispense: 30 tablet; Refill: 12  3. History of IUGR (intrauterine growth retardation) and stillbirth, currently pregnant     4. BV (bacterial vaginosis)    - metroNIDAZOLE (METROGEL VAGINAL) 0.75 % vaginal gel; Place 1 Applicatorful vaginally 2 (two) times daily.  Dispense: 70 g; Refill: 0  5. Yeast vaginitis    - terconazole (TERAZOL 3) 0.8 % vaginal cream; Place 1 applicator vaginally at bedtime.  Dispense: 20 g; Refill: 0  6. Nausea/vomiting in pregnancy    - Doxylamine-Pyridoxine ER (BONJESTA) 20-20 MG TBCR; Take 1 tablet by mouth 2 (two) times daily.  Dispense: 60 tablet; Refill: 6   Initial labs drawn. Continue prenatal vitamins. Genetic Screening discussed, NIPS: ordered. Ultrasound discussed; fetal anatomic survey: ordered. Problem list reviewed and updated. The nature of [place] with multiple MDs and other Advanced Practice Providers was explained to patient; also emphasized that residents, students are part of our team. Routine obstetric precautions reviewed. Return in about 4 weeks (around 07/02/2017) for [facility], Needs to see FP MD here.     Kandis Cocking, [place] for [employer], [place]

## 2017-06-04 NOTE — Telephone Encounter (Signed)
TC to pt regarding message about  Rx not being sent to pharm. Pt not ava left detailed message for pt to call back.

## 2017-06-05 LAB — CYTOLOGY - PAP
ADEQUACY: ABSENT
DIAGNOSIS: NEGATIVE
HPV: NOT DETECTED

## 2017-06-05 LAB — PROTEIN / CREATININE RATIO, URINE
CREATININE, UR: 296.2 mg/dL
Protein, Ur: 30.9 mg/dL
Protein/Creat Ratio: 104 mg/g creat (ref 0–200)

## 2017-06-06 ENCOUNTER — Other Ambulatory Visit: Payer: Self-pay | Admitting: Certified Nurse Midwife

## 2017-06-06 DIAGNOSIS — O0990 Supervision of high risk pregnancy, unspecified, unspecified trimester: Secondary | ICD-10-CM

## 2017-06-06 LAB — HEMOGLOBINOPATHY EVALUATION
HEMOGLOBIN F QUANTITATION: 0.7 % (ref 0.0–2.0)
HGB C: 0 %
HGB S: 0 %
HGB VARIANT: 0 %
Hemoglobin A2 Quantitation: 2.7 % (ref 1.8–3.2)
Hgb A: 96.6 % (ref 96.4–98.8)

## 2017-06-06 LAB — OBSTETRIC PANEL, INCLUDING HIV
Antibody Screen: NEGATIVE
BASOS ABS: 0 10*3/uL (ref 0.0–0.2)
Basos: 0 %
EOS (ABSOLUTE): 0.1 10*3/uL (ref 0.0–0.4)
Eos: 1 %
HIV SCREEN 4TH GENERATION: NONREACTIVE
Hematocrit: 38.7 % (ref 34.0–46.6)
Hemoglobin: 12.4 g/dL (ref 11.1–15.9)
Hepatitis B Surface Ag: NEGATIVE
Immature Grans (Abs): 0 10*3/uL (ref 0.0–0.1)
Immature Granulocytes: 0 %
LYMPHS ABS: 1.6 10*3/uL (ref 0.7–3.1)
Lymphs: 20 %
MCH: 28.4 pg (ref 26.6–33.0)
MCHC: 32 g/dL (ref 31.5–35.7)
MCV: 89 fL (ref 79–97)
Monocytes Absolute: 0.7 10*3/uL (ref 0.1–0.9)
Monocytes: 9 %
NEUTROS ABS: 5.6 10*3/uL (ref 1.4–7.0)
Neutrophils: 70 %
PLATELETS: 297 10*3/uL (ref 150–379)
RBC: 4.37 x10E6/uL (ref 3.77–5.28)
RDW: 14.2 % (ref 12.3–15.4)
RPR: NONREACTIVE
Rh Factor: POSITIVE
Rubella Antibodies, IGG: 3.63 index (ref >0.99)
WBC: 8 10*3/uL (ref 3.4–10.8)

## 2017-06-06 LAB — URINE CULTURE, OB REFLEX

## 2017-06-06 LAB — VITAMIN D 25 HYDROXY (VIT D DEFICIENCY, FRACTURES): Vit D, 25-Hydroxy: 26.2 ng/mL — ABNORMAL LOW (ref 30.0–100.0)

## 2017-06-06 LAB — CULTURE, OB URINE

## 2017-06-06 LAB — COMPREHENSIVE METABOLIC PANEL
ALT: 25 IU/L (ref 0–32)
AST: 18 IU/L (ref 0–40)
Albumin/Globulin Ratio: 1.7 (ref 1.2–2.2)
Albumin: 4.2 g/dL (ref 3.5–5.5)
Alkaline Phosphatase: 43 IU/L (ref 39–117)
BILIRUBIN TOTAL: 0.4 mg/dL (ref 0.0–1.2)
BUN / CREAT RATIO: 7 — AB (ref 9–23)
BUN: 6 mg/dL (ref 6–20)
CHLORIDE: 101 mmol/L (ref 96–106)
CO2: 22 mmol/L (ref 20–29)
Calcium: 9.1 mg/dL (ref 8.7–10.2)
Creatinine, Ser: 0.84 mg/dL (ref 0.57–1.00)
GFR calc non Af Amer: 88 mL/min/{1.73_m2} (ref >59)
GFR, EST AFRICAN AMERICAN: 101 mL/min/{1.73_m2} (ref >59)
Globulin, Total: 2.5 g/dL (ref 1.5–4.5)
Glucose: 96 mg/dL (ref 65–99)
POTASSIUM: 3.8 mmol/L (ref 3.5–5.2)
Sodium: 136 mmol/L (ref 134–144)
TOTAL PROTEIN: 6.7 g/dL (ref 6.0–8.5)

## 2017-06-06 LAB — HEMOGLOBIN A1C
ESTIMATED AVERAGE GLUCOSE: 114 mg/dL
Hgb A1c MFr Bld: 5.6 % (ref 4.8–5.6)

## 2017-06-07 ENCOUNTER — Other Ambulatory Visit: Payer: Self-pay | Admitting: Certified Nurse Midwife

## 2017-06-07 DIAGNOSIS — O0990 Supervision of high risk pregnancy, unspecified, unspecified trimester: Secondary | ICD-10-CM

## 2017-06-07 DIAGNOSIS — E559 Vitamin D deficiency, unspecified: Secondary | ICD-10-CM | POA: Insufficient documentation

## 2017-06-07 MED ORDER — VITAMIN D (ERGOCALCIFEROL) 1.25 MG (50000 UNIT) PO CAPS: 50000.0000 [IU] | ORAL_CAPSULE | ORAL | 2 refills | Status: DC

## 2017-06-08 LAB — CYSTIC FIBROSIS MUTATION 97: Interpretation: NOT DETECTED

## 2017-06-11 ENCOUNTER — Encounter: Payer: Self-pay | Admitting: Certified Nurse Midwife

## 2017-06-11 ENCOUNTER — Encounter: Payer: Self-pay | Admitting: *Deleted

## 2017-06-11 ENCOUNTER — Telehealth: Payer: Self-pay | Admitting: *Deleted

## 2017-06-11 ENCOUNTER — Encounter: Payer: Self-pay | Admitting: Obstetrics & Gynecology

## 2017-06-11 DIAGNOSIS — O09529 Supervision of elderly multigravida, unspecified trimester: Secondary | ICD-10-CM | POA: Insufficient documentation

## 2017-06-11 NOTE — Telephone Encounter (Signed)
Pt called to office for Panorama results. Results of test and sex were given to pt. Pt has no other concerns.

## 2017-06-15 ENCOUNTER — Other Ambulatory Visit: Payer: Self-pay | Admitting: Certified Nurse Midwife

## 2017-06-15 DIAGNOSIS — O0990 Supervision of high risk pregnancy, unspecified, unspecified trimester: Secondary | ICD-10-CM

## 2017-07-02 ENCOUNTER — Encounter: Payer: Self-pay | Admitting: Obstetrics and Gynecology

## 2017-07-02 ENCOUNTER — Encounter: Payer: 59 | Admitting: Obstetrics and Gynecology

## 2017-07-02 ENCOUNTER — Ambulatory Visit (INDEPENDENT_AMBULATORY_CARE_PROVIDER_SITE_OTHER): Payer: 59 | Admitting: Obstetrics and Gynecology

## 2017-07-02 VITALS — BP 130/82 | HR 83 | Wt 214.6 lb

## 2017-07-02 DIAGNOSIS — O10919 Unspecified pre-existing hypertension complicating pregnancy, unspecified trimester: Secondary | ICD-10-CM

## 2017-07-02 DIAGNOSIS — Z8759 Personal history of other complications of pregnancy, childbirth and the puerperium: Secondary | ICD-10-CM

## 2017-07-02 DIAGNOSIS — O10912 Unspecified pre-existing hypertension complicating pregnancy, second trimester: Secondary | ICD-10-CM

## 2017-07-02 DIAGNOSIS — O0990 Supervision of high risk pregnancy, unspecified, unspecified trimester: Secondary | ICD-10-CM

## 2017-07-02 DIAGNOSIS — O09522 Supervision of elderly multigravida, second trimester: Secondary | ICD-10-CM

## 2017-07-02 DIAGNOSIS — O0992 Supervision of high risk pregnancy, unspecified, second trimester: Secondary | ICD-10-CM

## 2017-07-02 NOTE — Progress Notes (Signed)
   PRENATAL VISIT NOTE  Subjective:  Tracey [name] is a 42 y.o. [id number] at 3858w2d being seen today for ongoing prenatal care.  She is currently monitored for the following issues for this high-risk pregnancy and has Chronic hypertension during pregnancy, antepartum; Anxiety, generalized; Supervision of high risk pregnancy, antepartum; History of prior pregnancy with IUGR newborn; Vitamin D deficiency; and AMA (advanced maternal age) multigravida 35+ on their problem list.  Patient reports no complaints.  Contractions: Not present. Vag. Bleeding: None.  Movement: Present. Denies leaking of fluid.   The following portions of the patient's history were reviewed and updated as appropriate: allergies, current medications, past family history, past medical history, past social history, past surgical history and problem list. Problem list updated.  Objective:   Vitals:   07/02/17 0856  BP: 130/82  Pulse: 83  Weight: 214 lb 9.6 oz (97.3 kg)    Fetal Status: Fetal Heart Rate (bpm): 156   Movement: Present     General:  Alert, oriented and cooperative. Patient is in no acute distress.  Skin: Skin is warm and dry. No rash noted.   Cardiovascular: Normal heart rate noted  Respiratory: Normal respiratory effort, no problems with respiration noted  Abdomen: Soft, gravid, appropriate for gestational age.  Pain/Pressure: Absent     Pelvic: Cervical exam deferred        Extremities: Normal range of motion.  Edema: None  Mental Status: Normal mood and affect. Normal behavior. Normal judgment and thought content.   Assessment and Plan:  Pregnancy: [id number] at 7458w2d  1. Supervision of high risk pregnancy, antepartum Patient is doing well Anatomy ultrasound scheduled 5/9 AFP today  2. Chronic hypertension during pregnancy, antepartum Continue ASA  3. History of prior pregnancy with IUGR newborn   4. Multigravida of advanced maternal age in second trimester Normal panorama AFP today  Preterm  labor symptoms and general obstetric precautions including but not limited to vaginal bleeding, contractions, leaking of fluid and fetal movement were reviewed in detail with the patient. Please refer to After Visit Summary for other counseling recommendations.  Return in about 1 month (around 07/30/2017) for ROB.  Future Appointments  Date Time Provider Department Center  07/26/2017 10:00 AM [facility] US 3 [facility]    Catalina AntiguaPeggy Havannah Streat, MD

## 2017-07-04 LAB — AFP, SERUM, OPEN SPINA BIFIDA
AFP MoM: 2.32
AFP VALUE AFPOSL: 55.9 ng/mL
Gest. Age on Collection Date: 15.2 weeks
Maternal Age At EDD: 39.9 yr
OSBR Risk 1 IN: 819
TEST RESULTS AFP: NEGATIVE
Weight: 214 [lb_av]

## 2017-07-13 ENCOUNTER — Encounter: Payer: Self-pay | Admitting: Obstetrics

## 2017-07-13 ENCOUNTER — Ambulatory Visit (INDEPENDENT_AMBULATORY_CARE_PROVIDER_SITE_OTHER): Payer: 59 | Admitting: Obstetrics

## 2017-07-13 VITALS — BP 141/76 | HR 98 | Wt 213.0 lb

## 2017-07-13 DIAGNOSIS — M545 Low back pain, unspecified: Secondary | ICD-10-CM

## 2017-07-13 DIAGNOSIS — O0990 Supervision of high risk pregnancy, unspecified, unspecified trimester: Secondary | ICD-10-CM

## 2017-07-13 MED ORDER — COMFORT FIT MATERNITY SUPP SM MISC: 0 refills | Status: DC

## 2017-07-13 NOTE — Progress Notes (Signed)
ROB c/o pain and pressure in abdomen denies any vaginal pressure of bleeding.

## 2017-07-13 NOTE — Progress Notes (Signed)
Subjective:  Tracey BiddingCindy Darrah is a 42 y.o. [id number] at 856w6d being seen today for ongoing prenatal care.  She is currently monitored for the following issues for this low-risk pregnancy and has Chronic hypertension during pregnancy, antepartum; Anxiety, generalized; Supervision of high risk pregnancy, antepartum; History of prior pregnancy with IUGR newborn; Vitamin D deficiency; and AMA (advanced maternal age) multigravida 35+ on their problem list.  Patient reports backache and pressure.  Contractions: Not present. Vag. Bleeding: None.  Movement: Present. Denies leaking of fluid.   The following portions of the patient's history were reviewed and updated as appropriate: allergies, current medications, past family history, past medical history, past social history, past surgical history and problem list. Problem list updated.  Objective:   Vitals:   07/13/17 1104  BP: (!) 141/76  Pulse: 98  Weight: 213 lb (96.6 kg)    Fetal Status: Fetal Heart Rate (bpm): 150.   Movement: Present     General:  Alert, oriented and cooperative. Patient is in no acute distress.  Skin: Skin is warm and dry. No rash noted.   Cardiovascular: Normal heart rate noted  Respiratory: Normal respiratory effort, no problems with respiration noted  Abdomen: Soft, gravid, appropriate for gestational age. Pain/Pressure: Present     Pelvic:  Cervical exam deferred        Extremities: Normal range of motion.  Edema: None  Mental Status: Normal mood and affect. Normal behavior. Normal judgment and thought content.   Urinalysis:      Assessment and Plan:  Pregnancy: [id number] at 676w6d  1. Supervision of high risk pregnancy, antepartum  2. Acute midline low back pain without sciatica Rx: - Elastic Bandages & Supports (COMFORT FIT MATERNITY SUPP SM) MISC; Wear as directed.  Dispense: 1 each; Refill: 0  Preterm labor symptoms and general obstetric precautions including but not limited to vaginal bleeding, contractions,  leaking of fluid and fetal movement were reviewed in detail with the patient. Please refer to After Visit Summary for other counseling recommendations.  Return in about 1 month (around 08/10/2017) for ROB.   Brock BadHarper, Charles A, MD

## 2017-07-26 ENCOUNTER — Ambulatory Visit (HOSPITAL_COMMUNITY)
Admission: RE | Admit: 2017-07-26 | Discharge: 2017-07-26 | Disposition: A | Discharge status: Home / Self Care | Payer: 59 | Source: Ambulatory Visit | Attending: Certified Nurse Midwife | Admitting: Certified Nurse Midwife

## 2017-07-26 ENCOUNTER — Other Ambulatory Visit (HOSPITAL_COMMUNITY): Payer: Self-pay | Admitting: *Deleted

## 2017-07-26 ENCOUNTER — Other Ambulatory Visit: Payer: Self-pay | Admitting: Certified Nurse Midwife

## 2017-07-26 ENCOUNTER — Encounter (HOSPITAL_COMMUNITY): Payer: Self-pay

## 2017-07-26 DIAGNOSIS — O10012 Pre-existing essential hypertension complicating pregnancy, second trimester: Secondary | ICD-10-CM | POA: Diagnosis not present

## 2017-07-26 DIAGNOSIS — E669 Obesity, unspecified: Secondary | ICD-10-CM | POA: Diagnosis not present

## 2017-07-26 DIAGNOSIS — O0990 Supervision of high risk pregnancy, unspecified, unspecified trimester: Secondary | ICD-10-CM

## 2017-07-26 DIAGNOSIS — O09292 Supervision of pregnancy with other poor reproductive or obstetric history, second trimester: Secondary | ICD-10-CM | POA: Insufficient documentation

## 2017-07-26 DIAGNOSIS — Z363 Encounter for antenatal screening for malformations: Secondary | ICD-10-CM

## 2017-07-26 DIAGNOSIS — O99212 Obesity complicating pregnancy, second trimester: Secondary | ICD-10-CM

## 2017-07-26 DIAGNOSIS — O09299 Supervision of pregnancy with other poor reproductive or obstetric history, unspecified trimester: Secondary | ICD-10-CM

## 2017-07-26 DIAGNOSIS — O09522 Supervision of elderly multigravida, second trimester: Secondary | ICD-10-CM

## 2017-07-26 DIAGNOSIS — Z3A18 18 weeks gestation of pregnancy: Secondary | ICD-10-CM

## 2017-07-26 DIAGNOSIS — Z7982 Long term (current) use of aspirin: Secondary | ICD-10-CM | POA: Diagnosis not present

## 2017-07-26 DIAGNOSIS — Z362 Encounter for other antenatal screening follow-up: Secondary | ICD-10-CM

## 2017-07-26 IMAGING — US US MFM OB DETAIL+14 WK
1 series · 14 of 28 positions shown · non-contrast
Comparison: none

[Series 1: us mfm ob detail+14 wk · 88 acquisitions, 14 frames shown]
[im 4/88]
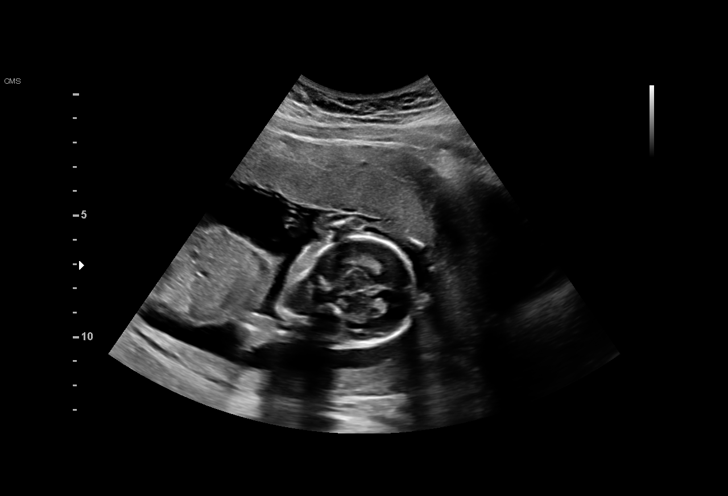
[im 10/88]
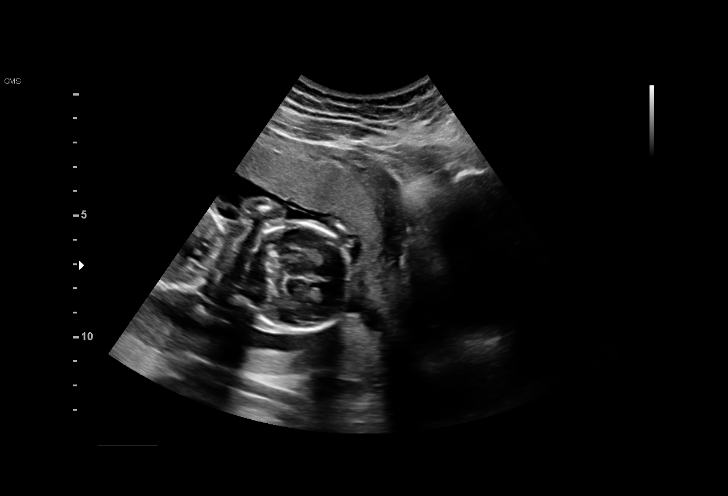
[im 17/88]
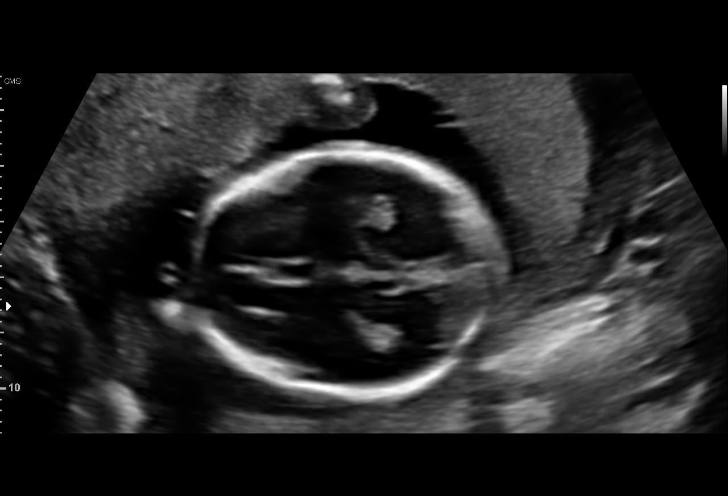
[im 23/88]
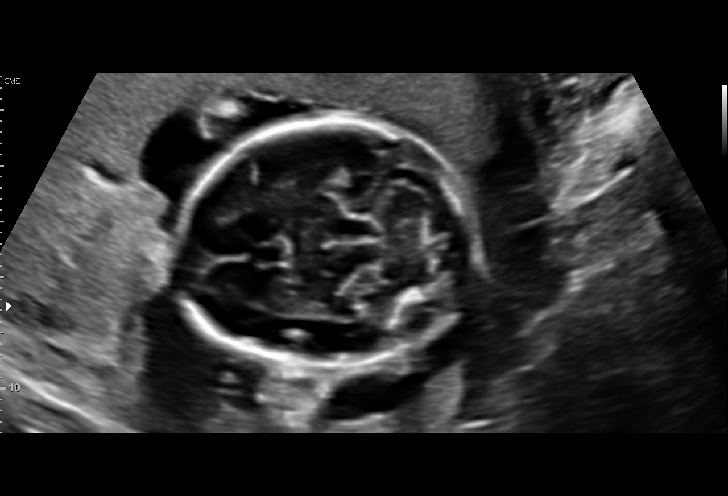
[im 30/88]
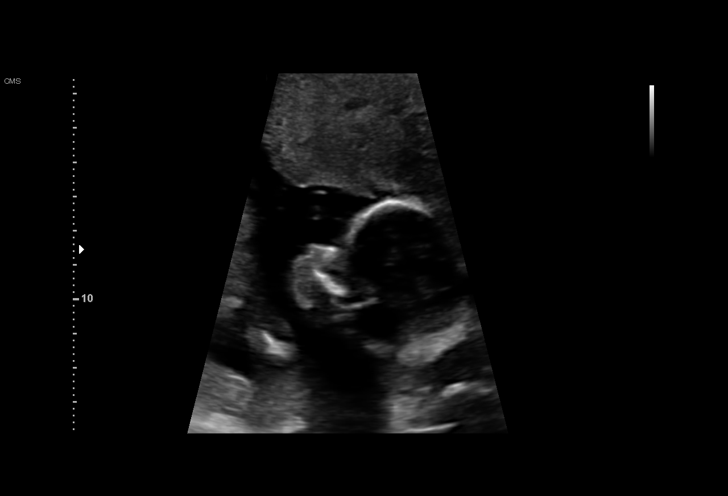
[im 36/88]
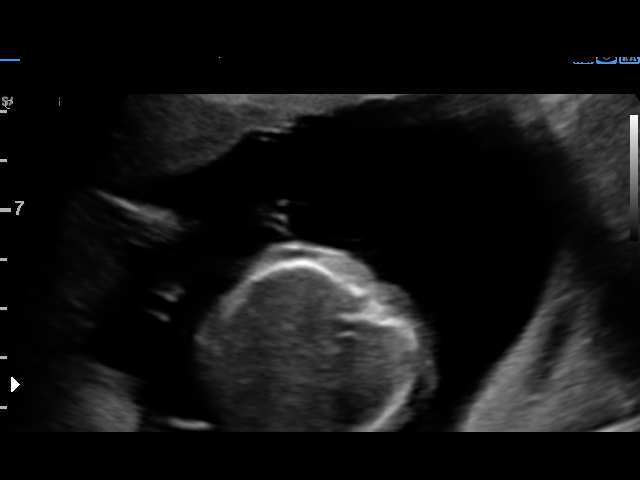
[im 42/88]
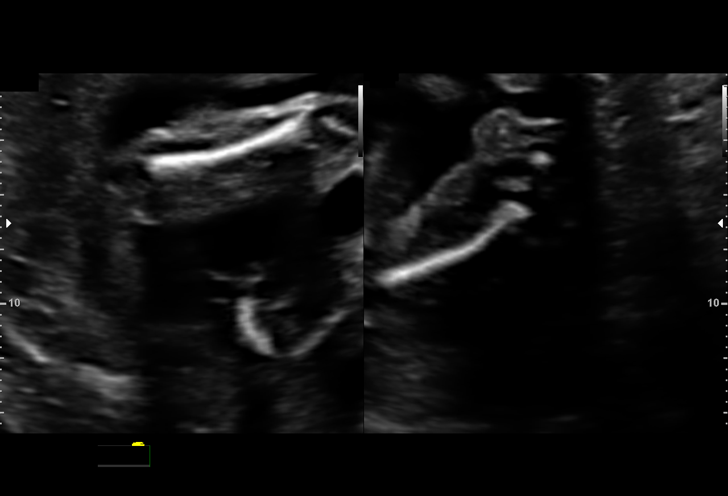
[im 49/88]
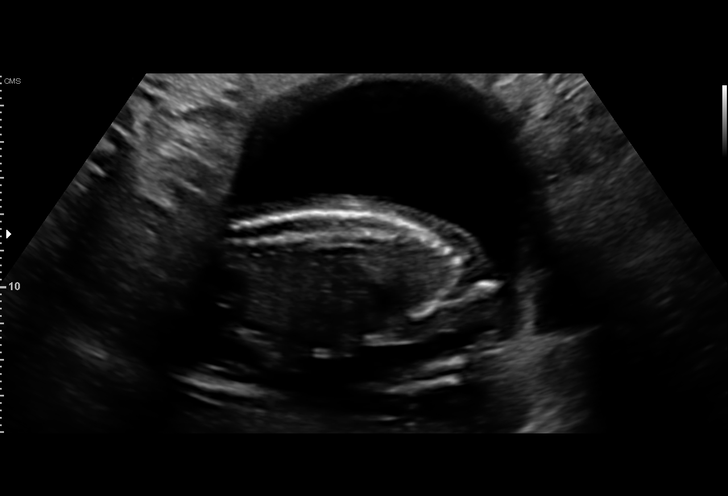
[im 55/88]
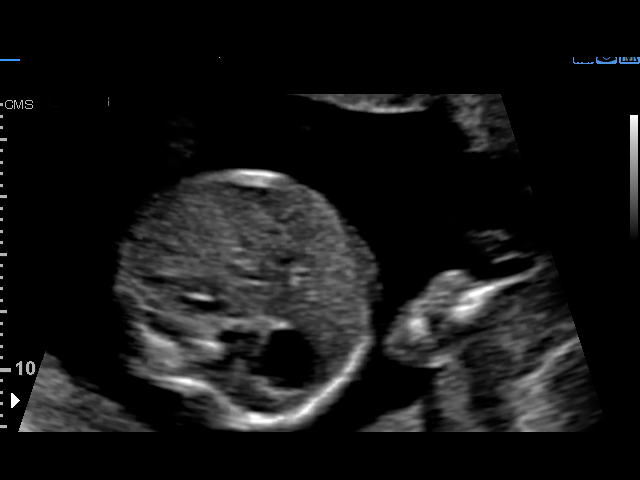
[im 62/88]
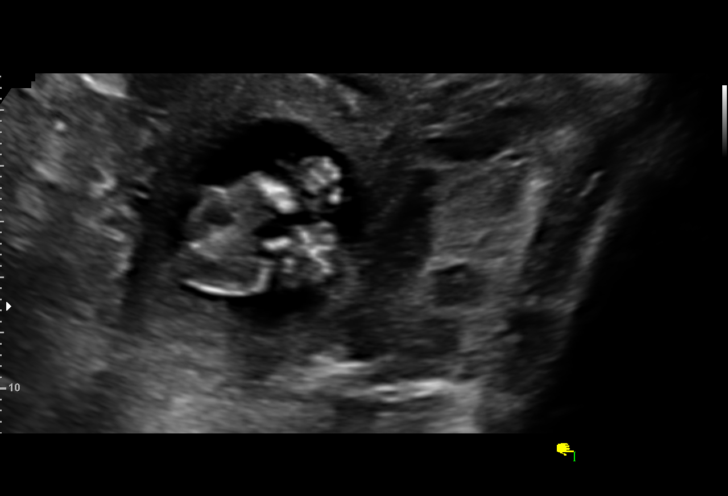
[im 68/88]
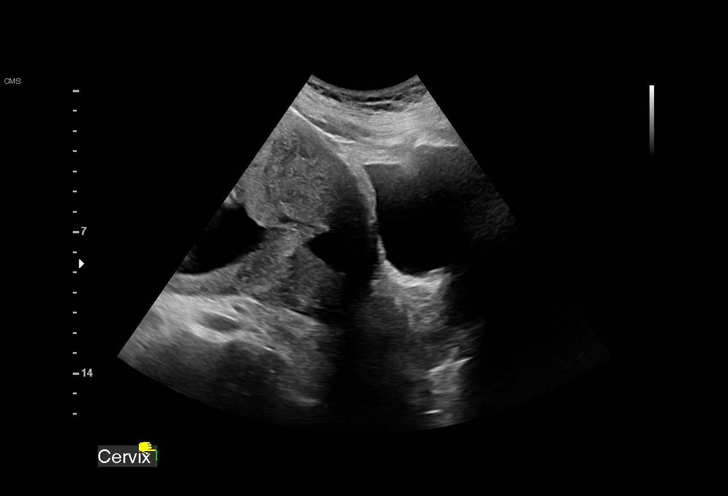
[im 75/88]
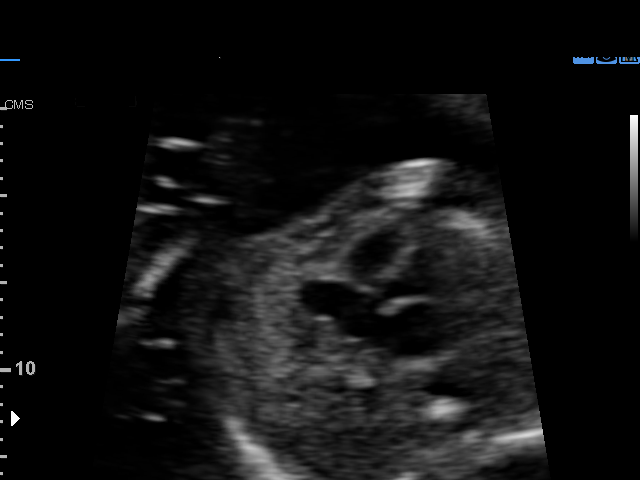
[im 81/88]
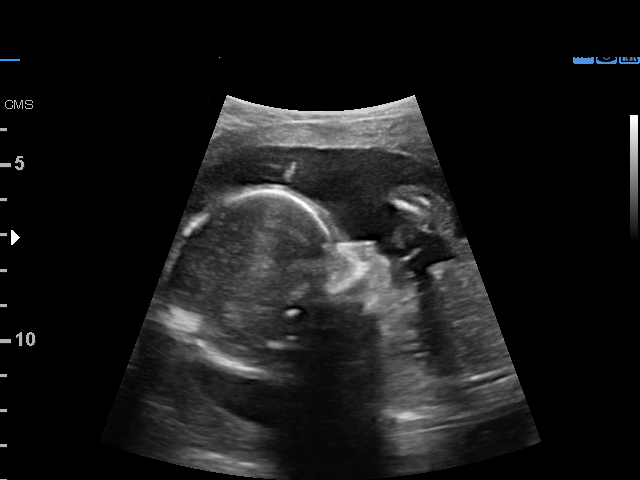
[im 88/88]
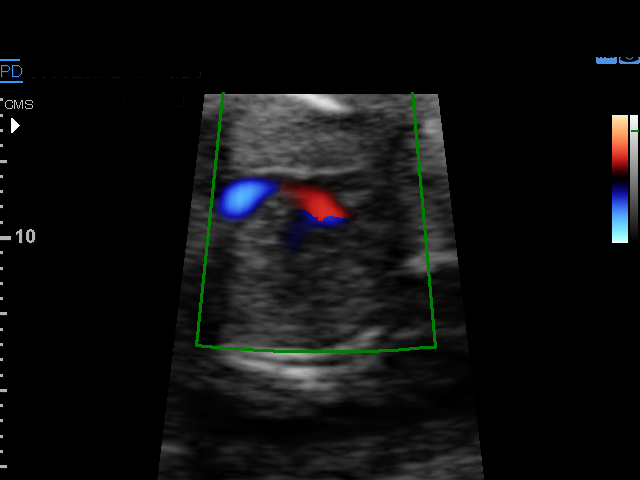

[14 of 28 positions shown; findings below may reference images not displayed]

[REDACTED]care - [HOSPITAL]

Indications

18 weeks gestation of pregnancy
Encounter for antenatal screening for
malformations
Obesity complicating pregnancy, second
trimester (pregravid BMI 33)
Poor obstetric history: Previous fetal growth
restriction (FGR)
Advanced maternal age multigravida 41,
second trimester (low risk NIPS)
Hypertension - Chronic/Pre-existing (ASA)
OB History

Gravidity:    5         Term:   2        Prem:   0        SAB:   2
TOP:          0       Ectopic:  0        Living: 2
Fetal Evaluation

Num Of Fetuses:     1
Cardiac Activity:   Observed
Presentation:       Variable
Placenta:           Anterior, above cervical os
P. Cord Insertion:  Visualized

Amniotic Fluid
AFI FV:      Subjectively within normal limits

Largest Pocket(cm)
5.29
Biometry

BPD:      42.7  mm     G. Age:  18w 6d         60  %    CI:        70.37   %    70 - 86
FL/HC:      17.4   %    16.1 -
HC:      162.3  mm     G. Age:  19w 0d         57  %    HC/AC:      1.13        1.09 -
AC:      143.5  mm     G. Age:  19w 5d         77  %    FL/BPD:     66.3   %
FL:       28.3  mm     G. Age:  18w 5d         43  %    FL/AC:      19.7   %    20 - 24
HUM:      28.9  mm     G. Age:  19w 3d         70  %
CER:        19  mm     G. Age:  18w 4d         43  %
NFT:       4.9  mm

CM:        3.3  mm

Est. FW:     279  gm    0 lb 10 oz      53  %
Gestational Age

LMP:           18w 5d        Date:  03/17/17                 EDD:   12/22/17
U/S Today:     19w 1d                                        EDD:   12/19/17
Best:          18w 5d     Det. By:  LMP  (03/17/17)          EDD:   12/22/17
Anatomy

Cranium:               Appears normal         Aortic Arch:            Not well visualized
Cavum:                 Appears normal         Ductal Arch:            Not well visualized
Ventricles:            Appears normal         Diaphragm:              Appears normal
Choroid Plexus:        Appears normal         Stomach:                Appears normal, left
sided
Cerebellum:            Appears normal         Abdomen:                Appears normal
Posterior Fossa:       Appears normal         Abdominal Wall:         Appears nml (cord
insert, abd wall)
Nuchal Fold:           Appears normal         Cord Vessels:           Appears normal (3
vessel cord)
Face:                  Appears normal         Kidneys:                Appear normal
(orbits and profile)
Lips:                  Appears normal         Bladder:                Appears normal
Thoracic:              Appears normal         Spine:                  Appears normal
Heart:                 Appears normal         Upper Extremities:      Appears normal
(4CH, axis, and
situs)
RVOT:                  Appears normal         Lower Extremities:      Appears normal
LVOT:                  Appears normal

Other:  Female gender. Heels and 5th digit visualized. Nasal bone visualized.
Technically difficult due to fetal position.
Cervix Uterus Adnexa

Cervix
Length:            3.8  cm.
Normal appearance by transabdominal scan.

Uterus
No abnormality visualized.

Left Ovary
Within normal limits.

Right Ovary
Not visualized.
Cul De Sac:   No free fluid seen.
Adnexa:       No abnormality visualized.
Impression

SIUP at 18+5 weeks
Normal detailed fetal anatomy; limited views of arches
Markers of aneuploidy: none
Normal amniotic fluid volume
Measurements consistent with LMP dating
Recommendations

Follow-up ultrasound in 4 weeks to complete anatomy survey

## 2017-07-30 ENCOUNTER — Encounter: Payer: 59 | Admitting: Obstetrics and Gynecology

## 2017-07-30 ENCOUNTER — Other Ambulatory Visit: Payer: Self-pay | Admitting: Certified Nurse Midwife

## 2017-07-30 DIAGNOSIS — O0990 Supervision of high risk pregnancy, unspecified, unspecified trimester: Secondary | ICD-10-CM

## 2017-08-06 ENCOUNTER — Encounter (HOSPITAL_COMMUNITY): Payer: Self-pay

## 2017-08-06 ENCOUNTER — Inpatient Hospital Stay (HOSPITAL_COMMUNITY)
Admission: AD | Admit: 2017-08-06 | Discharge: 2017-08-07 | Disposition: A | Discharge status: Home / Self Care | Payer: 59 | Source: Ambulatory Visit | Attending: Obstetrics & Gynecology | Admitting: Obstetrics & Gynecology

## 2017-08-06 DIAGNOSIS — R109 Unspecified abdominal pain: Secondary | ICD-10-CM | POA: Diagnosis not present

## 2017-08-06 DIAGNOSIS — O99332 Smoking (tobacco) complicating pregnancy, second trimester: Secondary | ICD-10-CM | POA: Diagnosis not present

## 2017-08-06 DIAGNOSIS — M6208 Separation of muscle (nontraumatic), other site: Secondary | ICD-10-CM | POA: Insufficient documentation

## 2017-08-06 DIAGNOSIS — O9989 Other specified diseases and conditions complicating pregnancy, childbirth and the puerperium: Secondary | ICD-10-CM | POA: Diagnosis not present

## 2017-08-06 DIAGNOSIS — Z3A20 20 weeks gestation of pregnancy: Secondary | ICD-10-CM | POA: Insufficient documentation

## 2017-08-06 DIAGNOSIS — O26892 Other specified pregnancy related conditions, second trimester: Secondary | ICD-10-CM | POA: Diagnosis not present

## 2017-08-06 DIAGNOSIS — F17210 Nicotine dependence, cigarettes, uncomplicated: Secondary | ICD-10-CM | POA: Insufficient documentation

## 2017-08-06 DIAGNOSIS — O98812 Other maternal infectious and parasitic diseases complicating pregnancy, second trimester: Secondary | ICD-10-CM | POA: Diagnosis not present

## 2017-08-06 DIAGNOSIS — Z7982 Long term (current) use of aspirin: Secondary | ICD-10-CM | POA: Diagnosis not present

## 2017-08-06 DIAGNOSIS — R1010 Upper abdominal pain, unspecified: Secondary | ICD-10-CM | POA: Diagnosis present

## 2017-08-06 DIAGNOSIS — B3731 Acute candidiasis of vulva and vagina: Secondary | ICD-10-CM

## 2017-08-06 DIAGNOSIS — B373 Candidiasis of vulva and vagina: Secondary | ICD-10-CM | POA: Insufficient documentation

## 2017-08-06 LAB — WET PREP, GENITAL
Clue Cells Wet Prep HPF POC: NONE SEEN
Sperm: NONE SEEN
Trich, Wet Prep: NONE SEEN

## 2017-08-06 NOTE — MAU Provider Note (Signed)
Chief Complaint: Abdominal Pain   First Provider Initiated Contact with Patient 08/06/17 2314    SUBJECTIVE HPI: Tracey [name] is a 42 y.o. [id number] at [redacted]w[redacted]d by LMP who presents to maternity admissions reporting abdominal pain. She reports upper abdominal pain that she believes to be braxton hicks contractions. She reports the pain be a vertical pain in the upper middle aspect of her abdomen. She describes the pain as tightening that is sometime sharp when she moves, walks, or getting up out of bed. She reports the abdominal has been occurring intermittently for 1 week but got worse today when she was moving around and picking up her son for his birthday. She rates abdominal pain 4/10- has not taken medication for abdominal pain. She denies lower abdominal cramping, vaginal bleeding, vaginal itching/burning, urinary symptoms, h/a, dizziness, n/v, or fever/chills.    Past Medical History:  Diagnosis Date  . Anxiety   . Bronchitis   . Carpal tunnel syndrome on both sides   . Depression   . Family history of adverse reaction to anesthesia    mother had problems waking up from surgery.  . Headache    migraines  . Medical history non-contributory   . Panic attack 2017   diagnosed 3 months ago. no meds presently  . Reflux    did not filll prescription   Past Surgical History:  Procedure Laterality Date  . BREAST BIOPSY  right    cyst  . DILATION AND EVACUATION N/A 07/26/2015   Procedure: DILATATION AND EVACUATION;  Surgeon: Brock Bad, MD;  Location: [facility];  Service: Gynecology;  Laterality: N/A;   Social History   Socioeconomic History  . Marital status: Married    Spouse name: Not on file  . Number of children: Not on file  . Years of education: Not on file  . Highest education level: Not on file  Occupational History  . Not on file  Social Needs  . Financial resource strain: Not on file  . Food insecurity:    Worry: Not on file    Inability: Not on file  . Transportation  needs:    Medical: Not on file    Non-medical: Not on file  Tobacco Use  . Smoking status: Current Some Day Smoker    Packs/day: 0.25    Types: Cigarettes  . Smokeless tobacco: Never Used  . Tobacco comment: 2 cig/day  Substance and Sexual Activity  . Alcohol use: No    Alcohol/week: 0.0 oz  . Drug use: No  . Sexual activity: Yes  Lifestyle  . Physical activity:    Days per week: Not on file    Minutes per session: Not on file  . Stress: Not on file  Relationships  . Social connections:    Talks on phone: Not on file    Gets together: Not on file    Attends religious service: Not on file    Active member of club or organization: Not on file    Attends meetings of clubs or organizations: Not on file    Relationship status: Not on file  . Intimate partner violence:    Fear of current or ex partner: Not on file    Emotionally abused: Not on file    Physically abused: Not on file    Forced sexual activity: Not on file  Other Topics Concern  . Not on file  Social History Narrative  . Not on file   No current facility-administered medications on file prior  to encounter.    Current Outpatient Medications on File Prior to Encounter  Medication Sig Dispense Refill  . aspirin 81 MG chewable tablet Chew 1 tablet (81 mg total) by mouth daily. 30 tablet 12  . Prenatal Vit-FePoly-FA-DHA (VITAFOL-ONE) 29-1-200 MG CAPS Take 1 capsule by mouth daily before breakfast. 90 capsule 3  . Doxylamine-Pyridoxine ER (BONJESTA) 20-20 MG TBCR Take 1 tablet by mouth 2 (two) times daily. (Patient not taking: Reported on 07/26/2017) 60 tablet 6  . Doxylamine-Pyridoxine ER (BONJESTA) 20-20 MG TBCR Take 1 tablet by mouth 2 (two) times daily. (Patient not taking: Reported on 07/26/2017) 60 tablet 6  . Vitamin D, Ergocalciferol, (DRISDOL) 50000 units CAPS capsule Take 1 capsule (50,000 Units total) by mouth every 7 (seven) days. 30 capsule 2   No Known Allergies  ROS:  Review of Systems  Respiratory:  Negative.   Cardiovascular: Negative.   Gastrointestinal: Positive for abdominal pain. Negative for constipation, diarrhea, nausea and vomiting.  Genitourinary: Negative.   Musculoskeletal: Negative.    I have reviewed patient's Past Medical Hx, Surgical Hx, Family Hx, Social Hx, medications and allergies.   Physical Exam   Vitals:   08/06/17 2222 08/07/17 0009  BP: (!) 119/57 137/72  Pulse: 71 74  Resp: 18 19  Temp: 98.7 F (37.1 C)   TempSrc: Oral   SpO2: 100%   Weight: 216 lb (98 kg)   Height:  (1.702 m)    Constitutional: Well-developed, well-nourished female in no acute distress.  Cardiovascular: normal rate Respiratory: normal effort GI: Abd soft, non-tender. Pos BS x 4, fundus appropriate for gestational age and palpated , upon patient sitting up from laying down abdominal diastasis palpated- patient referred to the area of diastasis to where her pain is  Neurologic: Alert and oriented x 4.  GU: Neg CVAT.  PELVIC EXAM: Cervix pink, visually closed, without lesion, moderate white curdy discharge- patient reports recent yeast infections, denies itching or irritation, vaginal walls and external genitalia normal Bimanual exam: Cervix 0/long/high, firm, anterior, neg CMT, uterus nontender, nonenlarged, adnexa without tenderness, enlargement, or mass  FHT 155 by doppler  LAB RESULTS Results for orders placed or performed during the hospital encounter of 08/06/17 (from the past 24 hour(s))  Wet prep, genital     Status: Abnormal   Collection Time: 08/06/17 11:32 PM  Result Value Ref Range   Yeast Wet Prep HPF POC PRESENT (A) NONE SEEN   Trich, Wet Prep NONE SEEN NONE SEEN   Clue Cells Wet Prep HPF POC NONE SEEN NONE SEEN   WBC, Wet Prep HPF POC MODERATE (A) NONE SEEN   Sperm NONE SEEN     B/Positive/-- (03/18 1319)  MAU Management/MDM: Orders Placed This Encounter  Procedures  . Wet prep, genital  . Urinalysis, Routine w reflex microscopic  .  Discharge patient Discharge disposition: 01-Home or Self Care; Discharge patient date: 08/06/2017   Wet prep- positive for yeast, offered treatment patient declines treatment at this time  GC/C- pending   Offered pain medication for abdominal pain- patient declines pain medication. Okay to discharge home. Pt discharged.  ASSESSMENT 1. Diastasis of rectus abdominis   2. Abdominal pain during pregnancy in second trimester   3. Vaginal yeast infection   4. [redacted] weeks gestation of pregnancy     PLAN Discharge home Follow up as scheduled for prenatal appointments  Return to MAU as needed  Discussed use of wearing maternity support belt daily while moving and working  Educated on abdominal  muscle exercises after delivery   Follow-up Information    [facility] Follow up.   Specialty:  Obstetrics and Gynecology Why:  Follow up as scheduled for prenatal appointments  Contact information: [address] [place] [address] [phone]          Allergies as of 08/06/2017   No Known Allergies     Medication List    TAKE these medications   aspirin 81 MG chewable tablet Chew 1 tablet (81 mg total) by mouth daily.   COMFORT FIT MATERNITY SUPP SM Misc Wear as directed.   Doxylamine-Pyridoxine ER 20-20 MG Tbcr Commonly known as:  BONJESTA Take 1 tablet by mouth 2 (two) times daily.   Doxylamine-Pyridoxine ER 20-20 MG Tbcr Commonly known as:  BONJESTA Take 1 tablet by mouth 2 (two) times daily.   VITAFOL-ONE 29-1-200 MG Caps Take 1 capsule by mouth daily before breakfast.   Vitamin D (Ergocalciferol) 50000 units Caps capsule Commonly known as:  DRISDOL Take 1 capsule (50,000 Units total) by mouth every 7 (seven) days.      Steward Drone  Certified Nurse-Midwife 08/06/2017  11:58 PM

## 2017-08-06 NOTE — MAU Note (Signed)
Pt reports feeling possible contractions that she's had all day. Gets better with rest but when she walks and moves it gets worse. Pt denies vag bleeding, reports some white discharge. States it was her son's birthday and has been up and moving around today.

## 2017-08-07 LAB — GC/CHLAMYDIA PROBE AMP (~~LOC~~) NOT AT ARMC
Chlamydia: NEGATIVE
Neisseria Gonorrhea: NEGATIVE

## 2017-08-09 ENCOUNTER — Ambulatory Visit (INDEPENDENT_AMBULATORY_CARE_PROVIDER_SITE_OTHER): Payer: 59 | Admitting: Obstetrics and Gynecology

## 2017-08-09 ENCOUNTER — Encounter: Payer: Self-pay | Admitting: Obstetrics and Gynecology

## 2017-08-09 VITALS — BP 128/76 | HR 83 | Wt 214.9 lb

## 2017-08-09 DIAGNOSIS — O09522 Supervision of elderly multigravida, second trimester: Secondary | ICD-10-CM

## 2017-08-09 DIAGNOSIS — O10912 Unspecified pre-existing hypertension complicating pregnancy, second trimester: Secondary | ICD-10-CM

## 2017-08-09 DIAGNOSIS — O0990 Supervision of high risk pregnancy, unspecified, unspecified trimester: Secondary | ICD-10-CM

## 2017-08-09 DIAGNOSIS — O0992 Supervision of high risk pregnancy, unspecified, second trimester: Secondary | ICD-10-CM

## 2017-08-09 DIAGNOSIS — Z8759 Personal history of other complications of pregnancy, childbirth and the puerperium: Secondary | ICD-10-CM

## 2017-08-09 DIAGNOSIS — O10919 Unspecified pre-existing hypertension complicating pregnancy, unspecified trimester: Secondary | ICD-10-CM

## 2017-08-09 NOTE — Progress Notes (Signed)
.  mle 

## 2017-08-09 NOTE — Patient Instructions (Signed)

## 2017-08-09 NOTE — Progress Notes (Signed)
Subjective:  Tracey [name] is a 42 y.o. [id number] at [redacted]w[redacted]d being seen today for ongoing prenatal care.  She is currently monitored for the following issues for this high-risk pregnancy and has Chronic hypertension during pregnancy, antepartum; Anxiety, generalized; Supervision of high risk pregnancy, antepartum; History of prior pregnancy with IUGR newborn; Vitamin D deficiency; and AMA (advanced maternal age) multigravida 35+ on their problem list.  Patient reports no complaints.  Contractions: Not present. Vag. Bleeding: None.  Movement: Present. Denies leaking of fluid.   The following portions of the patient's history were reviewed and updated as appropriate: allergies, current medications, past family history, past medical history, past social history, past surgical history and problem list. Problem list updated.  Objective:   Vitals:   08/09/17 0905  BP: 128/76  Pulse: 83  Weight: 214 lb 14.4 oz (97.5 kg)    Fetal Status: Fetal Heart Rate (bpm): 140   Movement: Present     General:  Alert, oriented and cooperative. Patient is in no acute distress.  Skin: Skin is warm and dry. No rash noted.   Cardiovascular: Normal heart rate noted  Respiratory: Normal respiratory effort, no problems with respiration noted  Abdomen: Soft, gravid, appropriate for gestational age. Pain/Pressure: Absent     Pelvic:  Cervical exam deferred        Extremities: Normal range of motion.  Edema: None  Mental Status: Normal mood and affect. Normal behavior. Normal judgment and thought content.   Urinalysis:      Assessment and Plan:  Pregnancy: [id number] at [redacted]w[redacted]d  1. Supervision of high risk pregnancy, antepartum Stable  2. History of prior pregnancy with IUGR newborn Growth scan June 6  3. Chronic hypertension during pregnancy, antepartum BP stable without meds Continue with BASA Growth scan as noted above  4. Multigravida of advanced maternal age in second trimester Normal Panorama  Preterm  labor symptoms and general obstetric precautions including but not limited to vaginal bleeding, contractions, leaking of fluid and fetal movement were reviewed in detail with the patient. Please refer to After Visit Summary for other counseling recommendations.  Return in about 1 month (around 09/06/2017) for OB visit.   Hermina Staggers, MD

## 2017-08-20 ENCOUNTER — Telehealth: Payer: Self-pay | Admitting: *Deleted

## 2017-08-20 NOTE — Telephone Encounter (Signed)
Return call placed to pt. Pt states she was not feeling well after eating oatmeal on morning. Pt states she just felt sluggish, tired and as if her sugar was low.  Pt made aware that she may need to limit the amount of sugar she intakes at meals.  Advised to make sure to include protein with each meal, esp if high carb/sugar meal. Pt made aware that this has only happened a few times.  Pt made aware that she may to have snacks in between meals as well. Pt would like early GTT if possible. Pt has appt in couple of weeks with Dr Debroah LoopArnold.  Pt advised that she may come in fasting at that appt and discuss with provider.  If provider would like, may be able to have GTT at that time.   Pt made aware that she may call office if she has any more questions/concerns.

## 2017-08-23 ENCOUNTER — Other Ambulatory Visit (HOSPITAL_COMMUNITY): Payer: Self-pay | Admitting: *Deleted

## 2017-08-23 ENCOUNTER — Ambulatory Visit (HOSPITAL_COMMUNITY)
Admission: RE | Admit: 2017-08-23 | Discharge: 2017-08-23 | Disposition: A | Discharge status: Home / Self Care | Payer: 59 | Source: Ambulatory Visit | Attending: Certified Nurse Midwife | Admitting: Certified Nurse Midwife

## 2017-08-23 ENCOUNTER — Other Ambulatory Visit (HOSPITAL_COMMUNITY): Payer: Self-pay | Admitting: Maternal and Fetal Medicine

## 2017-08-23 ENCOUNTER — Encounter (HOSPITAL_COMMUNITY): Payer: Self-pay

## 2017-08-23 DIAGNOSIS — O09522 Supervision of elderly multigravida, second trimester: Secondary | ICD-10-CM

## 2017-08-23 DIAGNOSIS — O09292 Supervision of pregnancy with other poor reproductive or obstetric history, second trimester: Secondary | ICD-10-CM | POA: Diagnosis not present

## 2017-08-23 DIAGNOSIS — Z362 Encounter for other antenatal screening follow-up: Secondary | ICD-10-CM | POA: Insufficient documentation

## 2017-08-23 DIAGNOSIS — O10012 Pre-existing essential hypertension complicating pregnancy, second trimester: Secondary | ICD-10-CM

## 2017-08-23 DIAGNOSIS — E669 Obesity, unspecified: Secondary | ICD-10-CM | POA: Diagnosis not present

## 2017-08-23 DIAGNOSIS — O10919 Unspecified pre-existing hypertension complicating pregnancy, unspecified trimester: Secondary | ICD-10-CM

## 2017-08-23 DIAGNOSIS — Z3A22 22 weeks gestation of pregnancy: Secondary | ICD-10-CM | POA: Diagnosis not present

## 2017-08-23 DIAGNOSIS — O99212 Obesity complicating pregnancy, second trimester: Secondary | ICD-10-CM | POA: Insufficient documentation

## 2017-08-24 ENCOUNTER — Other Ambulatory Visit (HOSPITAL_COMMUNITY): Payer: Self-pay | Admitting: *Deleted

## 2017-08-24 DIAGNOSIS — O10919 Unspecified pre-existing hypertension complicating pregnancy, unspecified trimester: Secondary | ICD-10-CM

## 2017-09-06 ENCOUNTER — Ambulatory Visit (INDEPENDENT_AMBULATORY_CARE_PROVIDER_SITE_OTHER): Payer: 59 | Admitting: Obstetrics & Gynecology

## 2017-09-06 VITALS — BP 130/79 | HR 85 | Wt 216.9 lb

## 2017-09-06 DIAGNOSIS — O10919 Unspecified pre-existing hypertension complicating pregnancy, unspecified trimester: Secondary | ICD-10-CM

## 2017-09-06 DIAGNOSIS — O10912 Unspecified pre-existing hypertension complicating pregnancy, second trimester: Secondary | ICD-10-CM

## 2017-09-06 DIAGNOSIS — G5600 Carpal tunnel syndrome, unspecified upper limb: Secondary | ICD-10-CM

## 2017-09-06 DIAGNOSIS — O26892 Other specified pregnancy related conditions, second trimester: Secondary | ICD-10-CM

## 2017-09-06 DIAGNOSIS — R12 Heartburn: Secondary | ICD-10-CM

## 2017-09-06 DIAGNOSIS — O0992 Supervision of high risk pregnancy, unspecified, second trimester: Secondary | ICD-10-CM

## 2017-09-06 DIAGNOSIS — O26899 Other specified pregnancy related conditions, unspecified trimester: Secondary | ICD-10-CM | POA: Insufficient documentation

## 2017-09-06 DIAGNOSIS — O0990 Supervision of high risk pregnancy, unspecified, unspecified trimester: Secondary | ICD-10-CM

## 2017-09-06 MED ORDER — PANTOPRAZOLE SODIUM 40 MG PO TBEC: 40.0000 mg | DELAYED_RELEASE_TABLET | Freq: Every day | ORAL | 3 refills | Status: DC

## 2017-09-06 NOTE — Patient Instructions (Addendum)
Piriformis Syndrome Piriformis syndrome is a condition that can cause pain and numbness in your buttocks and down the back of your leg. Piriformis syndrome happens when the small muscle that connects the base of your spine to your hip (piriformis muscle) presses on the nerve that runs down the back of your leg (sciatic nerve). The piriformis muscle helps your hip rotate and helps to bring your leg back and out. It also helps shift your weight while you are walking to keep you stable. The sciatic nerve runs under or through the piriformis. Damage to the piriformis muscle can cause spasms that put pressure on the nerve below. This causes pain and discomfort while sitting and moving. The pain may feel as if it begins in the buttock and spreads (radiates) down your hip and thigh. What are the causes? This condition is caused by pressure on the sciatic nerve from the piriformis muscle. The piriformis muscle can get irritated with overuse, especially if other hip muscles are weak and the piriformis has to do extra work. Piriformis syndrome can also occur after an injury, like a fall onto your buttocks. What increases the risk? This condition is more likely to develop in:  Women.  People who sit for long periods of time.  Cyclists.  People who have weak buttocks muscles (gluteal muscles).  What are the signs or symptoms? Pain, tingling, or numbness that starts in the buttock and runs down the back of your leg (sciatica) is the most common symptom of this condition. Your symptoms may:  Get worse the longer you sit.  Get worse when you walk, run, or go up on stairs.  How is this diagnosed? This condition is diagnosed based on your symptoms, medical history, and physical exam. During this exam, your health care provider may move your leg into different positions to check for pain. He or she will also press on the muscles of your hip and buttock to see if that increases your symptoms. You may also have  an X-ray or MRI. How is this treated? Treatment for this condition may include:  Stopping all activities that cause pain or make your condition worse.  Using heat or ice to relieve pain as told by your health care provider.  Taking medicines to reduce pain and swelling.  Taking a muscle relaxer to release the piriformis muscle.  Doing range-of-motion and strengthening exercises (physical therapy) as told by your health care provider.  Massaging the affected area.  Getting an injection of an anti-inflammatory medicine or muscle relaxer to reduce inflammation and muscle tension.  In rare cases, you may need surgery to cut the muscle and release pressure on the nerve if other treatments do not work. Follow these instructions at home:  Take over-the-counter and prescription medicines only as told by your health care provider.  Do not sit for long periods. Get up and walk around every 20 minutes or as often as told by your health care provider.  If directed, apply heat to the affected area as often as told by your health care provider. Use the heat source that your health care provider recommends, such as a moist heat pack or a heating pad. ? Place a towel between your skin and the heat source. ? Leave the heat on for 20-30 minutes. ? Remove the heat if your skin turns bright red. This is especially important if you are unable to feel pain, heat, or cold. You may have a greater risk of getting burned.  If   directed, apply ice to the injured area. ? Put ice in a plastic bag. ? Place a towel between your skin and the bag. ? Leave the ice on for 20 minutes, 2-3 times a day.  Do exercises as told by your health care provider.  Return to your normal activities as told by your health care provider. Ask your health care provider what activities are safe for you.  Keep all follow-up visits as told by your health care provider. This is important. How is this prevented?  Do not sit for  longer than 20 minutes at a time. When you sit, choose padded surfaces.  Warm up and stretch before being active.  Cool down and stretch after being active.  Give your body time to rest between periods of activity.  Make sure to use equipment that fits you.  Maintain physical fitness, including: ? Strength. ? Flexibility. Contact a health care provider if:  Your pain and stiffness continue or get worse.  Your leg or hip becomes weak.  You have changes in your bowel function or bladder function. This information is not intended to replace advice given to you by your health care provider. Make sure you discuss any questions you have with your health care provider. Document Released: 03/06/2005 Document Revised: 11/09/2015 Document Reviewed: 02/16/2015 Elsevier Interactive Patient Education  2018 [employer]. Second Trimester of Pregnancy The second trimester is from week 13 through week 28, month 4 through 6. This is often the time in pregnancy that you feel your best. Often times, morning sickness has lessened or quit. You may have more energy, and you may get hungry more often. Your unborn baby (fetus) is growing rapidly. At the end of the sixth month, he or she is about 9 inches long and weighs about 1 pounds. You will likely feel the baby move (quickening) between 18 and 20 weeks of pregnancy. Follow these instructions at home:  Avoid all smoking, herbs, and alcohol. Avoid drugs not approved by your doctor.  Do not use any tobacco products, including cigarettes, chewing tobacco, and electronic cigarettes. If you need help quitting, ask your doctor. You may get counseling or other support to help you quit.  Only take medicine as told by your doctor. Some medicines are safe and some are not during pregnancy.  Exercise only as told by your doctor. Stop exercising if you start having cramps.  Eat regular, healthy meals.  Wear a good support bra if your breasts are tender.  Do  not use hot tubs, steam rooms, or saunas.  Wear your seat belt when driving.  Avoid raw meat, uncooked cheese, and liter boxes and soil used by cats.  Take your prenatal vitamins.  Take 1500-2000 milligrams of calcium daily starting at the 20th week of pregnancy until you deliver your baby.  Try taking medicine that helps you poop (stool softener) as needed, and if your doctor approves. Eat more fiber by eating fresh fruit, vegetables, and whole grains. Drink enough fluids to keep your pee (urine) clear or pale yellow.  Take warm water baths (sitz baths) to soothe pain or discomfort caused by hemorrhoids. Use hemorrhoid cream if your doctor approves.  If you have puffy, bulging veins (varicose veins), wear support hose. Raise (elevate) your feet for 15 minutes, 3-4 times a day. Limit salt in your diet.  Avoid heavy lifting, wear low heals, and sit up straight.  Rest with your legs raised if you have leg cramps or low back pain.  Visit  your dentist if you have not gone during your pregnancy. Use a soft toothbrush to brush your teeth. Be gentle when you floss.  You can have sex (intercourse) unless your doctor tells you not to.  Go to your doctor visits. Get help if:  You feel dizzy.  You have mild cramps or pressure in your lower belly (abdomen).  You have a nagging pain in your belly area.  You continue to feel sick to your stomach (nauseous), throw up (vomit), or have watery poop (diarrhea).  You have bad smelling fluid coming from your vagina.  You have pain with peeing (urination). Get help right away if:  You have a fever.  You are leaking fluid from your vagina.  You have spotting or bleeding from your vagina.  You have severe belly cramping or pain.  You lose or gain weight rapidly.  You have trouble catching your breath and have chest pain.  You notice sudden or extreme puffiness (swelling) of your face, hands, ankles, feet, or legs.  You have not felt the  baby move in over an hour.  You have severe headaches that do not go away with medicine.  You have vision changes. This information is not intended to replace advice given to you by your health care provider. Make sure you discuss any questions you have with your health care provider. Document Released: 05/31/2009 Document Revised: 08/12/2015 Document Reviewed: 05/07/2012 Elsevier Interactive Patient Education  2017 [employer]

## 2017-09-06 NOTE — Progress Notes (Signed)
   PRENATAL VISIT NOTE  Subjective:  Tracey BiddingCindy Mazzaferro is a 42 y.o. [id number] at 7333w5d being seen today for ongoing prenatal care.  She is currently monitored for the following issues for this high-risk pregnancy and has Chronic hypertension during pregnancy, antepartum; Anxiety, generalized; Supervision of high risk pregnancy, antepartum; History of prior pregnancy with IUGR newborn; Vitamin D deficiency; AMA (advanced maternal age) multigravida 35+; Carpal tunnel syndrome during pregnancy; and Heartburn during pregnancy, antepartum on their problem list.  Patient reports backache, carpal tunnel symptoms, heartburn and sciatic pain.  Contractions: Not present. Vag. Bleeding: None.  Movement: Present. Denies leaking of fluid.   The following portions of the patient's history were reviewed and updated as appropriate: allergies, current medications, past family history, past medical history, past social history, past surgical history and problem list. Problem list updated.  Objective:   Vitals:   09/06/17 0856  BP: 130/79  Pulse: 85  Weight: 216 lb 14.4 oz (98.4 kg)    Fetal Status: Fetal Heart Rate (bpm): 140   Movement: Present     General:  Alert, oriented and cooperative. Patient is in no acute distress.  Skin: Skin is warm and dry. No rash noted.   Cardiovascular: Normal heart rate noted  Respiratory: Normal respiratory effort, no problems with respiration noted  Abdomen: Soft, gravid, appropriate for gestational age.  Pain/Pressure: Absent     Pelvic: Cervical exam deferred        Extremities: Normal range of motion.  Edema: Trace  Mental Status: Normal mood and affect. Normal behavior. Normal judgment and thought content.   Assessment and Plan:  Pregnancy: [id number] at 4933w5d  1. Carpal tunnel syndrome during pregnancy CTS - Wrist brace cock up volar  2. Chronic hypertension during pregnancy, antepartum ASA daily  3. Supervision of high risk pregnancy, antepartum heartburn -  pantoprazole (PROTONIX) 40 MG tablet; Take 1 tablet (40 mg total) by mouth daily.  Dispense: 30 tablet; Refill: 3  4. Heartburn during pregnancy, antepartum Protonix  Preterm labor symptoms and general obstetric precautions including but not limited to vaginal bleeding, contractions, leaking of fluid and fetal movement were reviewed in detail with the patient. Please refer to After Visit Summary for other counseling recommendations.  Return in about 3 weeks (around 09/27/2017).  Future Appointments  Date Time Provider Department Center  09/21/2017  9:30 AM [facility] US 1 [facility]  10/19/2017  9:30 AM [facility] US 1 [facility]    Scheryl DarterJames Jailey Booton, MD

## 2017-09-10 ENCOUNTER — Telehealth: Payer: Self-pay

## 2017-09-10 NOTE — Telephone Encounter (Signed)
Returned call to advise that FMLA paperwork was received, left vm.

## 2017-09-12 ENCOUNTER — Ambulatory Visit (INDEPENDENT_AMBULATORY_CARE_PROVIDER_SITE_OTHER): Payer: 59 | Admitting: Obstetrics

## 2017-09-12 ENCOUNTER — Other Ambulatory Visit: Payer: Self-pay

## 2017-09-12 VITALS — BP 122/73 | HR 85 | Wt 216.0 lb

## 2017-09-12 DIAGNOSIS — Z8759 Personal history of other complications of pregnancy, childbirth and the puerperium: Secondary | ICD-10-CM

## 2017-09-12 DIAGNOSIS — O10912 Unspecified pre-existing hypertension complicating pregnancy, second trimester: Secondary | ICD-10-CM

## 2017-09-12 DIAGNOSIS — O0990 Supervision of high risk pregnancy, unspecified, unspecified trimester: Secondary | ICD-10-CM

## 2017-09-12 DIAGNOSIS — O0992 Supervision of high risk pregnancy, unspecified, second trimester: Secondary | ICD-10-CM

## 2017-09-12 DIAGNOSIS — O10919 Unspecified pre-existing hypertension complicating pregnancy, unspecified trimester: Secondary | ICD-10-CM

## 2017-09-12 NOTE — Progress Notes (Signed)
ROB.  C/o pain 8/10, diahrrea and weakness today.

## 2017-09-13 ENCOUNTER — Encounter: Payer: Self-pay | Admitting: Obstetrics

## 2017-09-13 NOTE — Progress Notes (Signed)
Subjective:  Tracey BiddingCindy [name] is [name] 42 y.o. [id number] at 3533w5d being seen today for ongoing prenatal care.  She is currently monitored for the following issues for this high-risk pregnancy and has Chronic hypertension during pregnancy, antepartum; Anxiety, generalized; Supervision of high risk pregnancy, antepartum; History of prior pregnancy with IUGR newborn; Vitamin D deficiency; AMA (advanced maternal age) multigravida 35+; Carpal tunnel syndrome during pregnancy; and Heartburn during pregnancy, antepartum on their problem list.  Patient reports nausea and vomiting.  Contractions: Irregular. Vag. Bleeding: None.  Movement: Present. Denies leaking of fluid.   The following portions of the patient's history were reviewed and updated as appropriate: allergies, current medications, past family history, past medical history, past social history, past surgical history and problem list. Problem list updated.  Objective:   Vitals:   09/12/17 1558  BP: 122/73  Pulse: 85  Weight: 216 lb (98 kg)    Fetal Status: Fetal Heart Rate (bpm): 140   Movement: Present     General:  Alert, oriented and cooperative. Patient is in no acute distress.  Skin: Skin is warm and dry. No rash noted.   Cardiovascular: Normal heart rate noted  Respiratory: Normal respiratory effort, no problems with respiration noted  Abdomen: Soft, gravid, appropriate for gestational age. Pain/Pressure: Present     Pelvic:  Cervical exam deferred        Extremities: Normal range of motion.  Edema: Trace  Mental Status: Normal mood and affect. Normal behavior. Normal judgment and thought content.   Urinalysis:      Assessment and Plan:  Pregnancy: [id number] at 3133w5d  1. Supervision of high risk pregnancy, antepartum  2. Chronic hypertension during pregnancy, antepartum - stable  3. History of prior pregnancy with IUGR newborn   Preterm labor symptoms and general obstetric precautions including but not limited to vaginal bleeding,  contractions, leaking of fluid and fetal movement were reviewed in detail with the patient. Please refer to After Visit Summary for other counseling recommendations.  Return in about 3 weeks (around 10/03/2017) for ROB.   Tracey [name], Tracey Orren A, MD

## 2017-09-17 DIAGNOSIS — Z3482 Encounter for supervision of other normal pregnancy, second trimester: Secondary | ICD-10-CM

## 2017-09-19 ENCOUNTER — Encounter (HOSPITAL_COMMUNITY): Payer: Self-pay

## 2017-09-21 ENCOUNTER — Other Ambulatory Visit (HOSPITAL_COMMUNITY): Payer: Self-pay | Admitting: Maternal and Fetal Medicine

## 2017-09-21 ENCOUNTER — Ambulatory Visit (HOSPITAL_COMMUNITY)
Admission: RE | Admit: 2017-09-21 | Discharge: 2017-09-21 | Disposition: A | Discharge status: Home / Self Care | Payer: 59 | Source: Ambulatory Visit | Attending: Certified Nurse Midwife | Admitting: Certified Nurse Midwife

## 2017-09-21 ENCOUNTER — Encounter (HOSPITAL_COMMUNITY): Payer: Self-pay

## 2017-09-21 DIAGNOSIS — O10912 Unspecified pre-existing hypertension complicating pregnancy, second trimester: Secondary | ICD-10-CM | POA: Diagnosis present

## 2017-09-21 DIAGNOSIS — Z362 Encounter for other antenatal screening follow-up: Secondary | ICD-10-CM

## 2017-09-21 DIAGNOSIS — O99332 Smoking (tobacco) complicating pregnancy, second trimester: Secondary | ICD-10-CM

## 2017-09-21 DIAGNOSIS — O09522 Supervision of elderly multigravida, second trimester: Secondary | ICD-10-CM

## 2017-09-21 DIAGNOSIS — O10919 Unspecified pre-existing hypertension complicating pregnancy, unspecified trimester: Secondary | ICD-10-CM

## 2017-09-21 DIAGNOSIS — Z8759 Personal history of other complications of pregnancy, childbirth and the puerperium: Secondary | ICD-10-CM

## 2017-09-21 DIAGNOSIS — Z3A26 26 weeks gestation of pregnancy: Secondary | ICD-10-CM | POA: Diagnosis not present

## 2017-09-21 DIAGNOSIS — O99212 Obesity complicating pregnancy, second trimester: Secondary | ICD-10-CM | POA: Diagnosis not present

## 2017-09-21 HISTORY — DX: Essential (primary) hypertension: I10

## 2017-09-27 ENCOUNTER — Other Ambulatory Visit: Payer: 59

## 2017-09-27 ENCOUNTER — Ambulatory Visit (INDEPENDENT_AMBULATORY_CARE_PROVIDER_SITE_OTHER): Payer: 59 | Admitting: Obstetrics and Gynecology

## 2017-09-27 ENCOUNTER — Encounter: Payer: Self-pay | Admitting: Obstetrics and Gynecology

## 2017-09-27 ENCOUNTER — Encounter (HOSPITAL_COMMUNITY): Payer: Self-pay | Admitting: *Deleted

## 2017-09-27 ENCOUNTER — Inpatient Hospital Stay (HOSPITAL_COMMUNITY)
Admission: AD | Admit: 2017-09-27 | Discharge: 2017-09-28 | Disposition: A | Discharge status: Home / Self Care | Payer: 59 | Source: Ambulatory Visit | Attending: Obstetrics and Gynecology | Admitting: Obstetrics and Gynecology

## 2017-09-27 VITALS — BP 127/78 | HR 103 | Wt 214.6 lb

## 2017-09-27 DIAGNOSIS — Z3A27 27 weeks gestation of pregnancy: Secondary | ICD-10-CM | POA: Insufficient documentation

## 2017-09-27 DIAGNOSIS — R112 Nausea with vomiting, unspecified: Secondary | ICD-10-CM | POA: Insufficient documentation

## 2017-09-27 DIAGNOSIS — O10919 Unspecified pre-existing hypertension complicating pregnancy, unspecified trimester: Secondary | ICD-10-CM

## 2017-09-27 DIAGNOSIS — O26892 Other specified pregnancy related conditions, second trimester: Secondary | ICD-10-CM | POA: Diagnosis present

## 2017-09-27 DIAGNOSIS — O212 Late vomiting of pregnancy: Secondary | ICD-10-CM | POA: Diagnosis not present

## 2017-09-27 DIAGNOSIS — O09522 Supervision of elderly multigravida, second trimester: Secondary | ICD-10-CM

## 2017-09-27 DIAGNOSIS — O0992 Supervision of high risk pregnancy, unspecified, second trimester: Secondary | ICD-10-CM

## 2017-09-27 DIAGNOSIS — O0990 Supervision of high risk pregnancy, unspecified, unspecified trimester: Secondary | ICD-10-CM

## 2017-09-27 DIAGNOSIS — O99332 Smoking (tobacco) complicating pregnancy, second trimester: Secondary | ICD-10-CM | POA: Insufficient documentation

## 2017-09-27 DIAGNOSIS — Z79899 Other long term (current) drug therapy: Secondary | ICD-10-CM | POA: Insufficient documentation

## 2017-09-27 DIAGNOSIS — O24912 Unspecified diabetes mellitus in pregnancy, second trimester: Secondary | ICD-10-CM

## 2017-09-27 DIAGNOSIS — O09523 Supervision of elderly multigravida, third trimester: Secondary | ICD-10-CM

## 2017-09-27 DIAGNOSIS — R531 Weakness: Secondary | ICD-10-CM

## 2017-09-27 DIAGNOSIS — O10912 Unspecified pre-existing hypertension complicating pregnancy, second trimester: Secondary | ICD-10-CM

## 2017-09-27 DIAGNOSIS — O24913 Unspecified diabetes mellitus in pregnancy, third trimester: Secondary | ICD-10-CM

## 2017-09-27 NOTE — MAU Note (Signed)
[facility]   [facility].   THEN TOOK AGAIN.  THEN VOMITED AGAIN  1030PM.    SAYS WAS OK UNTIL TEST.

## 2017-09-27 NOTE — Progress Notes (Signed)
   PRENATAL VISIT NOTE  Subjective:  Tracey [name] is a 42 y.o. [id number] at 8438w5d being seen today for ongoing prenatal care.  She is currently monitored for the following issues for this high-risk pregnancy and has Chronic hypertension during pregnancy, antepartum; Anxiety, generalized; Supervision of high risk pregnancy, antepartum; History of prior pregnancy with IUGR newborn; Vitamin D deficiency; AMA (advanced maternal age) multigravida 35+; Carpal tunnel syndrome during pregnancy; and Heartburn during pregnancy, antepartum on their problem list.  Patient reports no complaints.  Contractions: Irritability. Vag. Bleeding: None.  Movement: Present. Denies leaking of fluid.   The following portions of the patient's history were reviewed and updated as appropriate: allergies, current medications, past family history, past medical history, past social history, past surgical history and problem list. Problem list updated.  Objective:   Vitals:   09/27/17 0856  BP: 127/78  Pulse: (!) 103  Weight: 214 lb 9.6 oz (97.3 kg)    Fetal Status: Fetal Heart Rate (bpm): 142 Fundal Height: 28 cm Movement: Present     General:  Alert, oriented and cooperative. Patient is in no acute distress.  Skin: Skin is warm and dry. No rash noted.   Cardiovascular: Normal heart rate noted  Respiratory: Normal respiratory effort, no problems with respiration noted  Abdomen: Soft, gravid, appropriate for gestational age.  Pain/Pressure: Absent     Pelvic: Cervical exam deferred        Extremities: Normal range of motion.  Edema: Trace  Mental Status: Normal mood and affect. Normal behavior. Normal judgment and thought content.   Assessment and Plan:  Pregnancy: [id number] at 6638w5d  1. Supervision of high risk pregnancy, antepartum Patient is doing well without complaints Third trimester labs today - Glucose Tolerance, 2 Hours w/1 Hour - CBC - HIV antibody - RPR  2. Chronic hypertension during pregnancy,  antepartum BP stable without meds Continue ASA Antenatal testing starting at 32 weeks  3. Multigravida of advanced maternal age in third trimester   Preterm labor symptoms and general obstetric precautions including but not limited to vaginal bleeding, contractions, leaking of fluid and fetal movement were reviewed in detail with the patient. Please refer to After Visit Summary for other counseling recommendations.  Return in about 2 weeks (around 10/11/2017) for ROB.  Future Appointments  Date Time Provider Department Center  10/19/2017  9:30 AM [facility] US 1 [facility]    Catalina AntiguaPeggy Ayvin Lipinski, MD

## 2017-09-28 DIAGNOSIS — Z3A27 27 weeks gestation of pregnancy: Secondary | ICD-10-CM

## 2017-09-28 DIAGNOSIS — O24419 Gestational diabetes mellitus in pregnancy, unspecified control: Secondary | ICD-10-CM | POA: Insufficient documentation

## 2017-09-28 DIAGNOSIS — O212 Late vomiting of pregnancy: Secondary | ICD-10-CM

## 2017-09-28 LAB — URINALYSIS, ROUTINE W REFLEX MICROSCOPIC
Bilirubin Urine: NEGATIVE
Glucose, UA: NEGATIVE mg/dL
Hgb urine dipstick: NEGATIVE
Ketones, ur: 80 mg/dL — AB
Nitrite: NEGATIVE
Protein, ur: 30 mg/dL — AB
SPECIFIC GRAVITY, URINE: 1.025 (ref 1.005–1.030)
pH: 6 (ref 5.0–8.0)

## 2017-09-28 LAB — CBC
HEMATOCRIT: 35.1 % (ref 34.0–46.6)
HEMOGLOBIN: 11.3 g/dL (ref 11.1–15.9)
MCH: 28.7 pg (ref 26.6–33.0)
MCHC: 32.2 g/dL (ref 31.5–35.7)
MCV: 89 fL (ref 79–97)
Platelets: 305 10*3/uL (ref 150–450)
RBC: 3.94 x10E6/uL (ref 3.77–5.28)
RDW: 14.1 % (ref 12.3–15.4)
WBC: 12 10*3/uL — ABNORMAL HIGH (ref 3.4–10.8)

## 2017-09-28 LAB — GLUCOSE TOLERANCE, 2 HOURS W/ 1HR
Glucose, 1 hour: 187 mg/dL — ABNORMAL HIGH (ref 65–179)
Glucose, 2 hour: 151 mg/dL (ref 65–152)
Glucose, Fasting: 96 mg/dL — ABNORMAL HIGH (ref 65–91)

## 2017-09-28 LAB — RPR: RPR Ser Ql: NONREACTIVE

## 2017-09-28 LAB — HIV ANTIBODY (ROUTINE TESTING W REFLEX): HIV SCREEN 4TH GENERATION: NONREACTIVE

## 2017-09-28 MED ORDER — ONDANSETRON 4 MG PO TBDP: 4.0000 mg | ORAL_TABLET | Freq: Four times a day (QID) | ORAL | 0 refills | Status: DC | PRN

## 2017-09-28 MED ORDER — ONDANSETRON 4 MG PO TBDP
4.0000 mg | ORAL_TABLET | Freq: Once | ORAL | Status: AC
  Administered 2017-09-28: 4 mg via ORAL
  Filled 2017-09-28: qty 1

## 2017-09-28 MED ORDER — ACCU-CHEK NANO SMARTVIEW W/DEVICE KIT: 1.0000 | PACK | 0 refills | Status: DC

## 2017-09-28 MED ORDER — GLUCOSE BLOOD VI STRP: ORAL_STRIP | 12 refills | Status: DC

## 2017-09-28 MED ORDER — ACCU-CHEK FASTCLIX LANCETS MISC: 1.0000 [IU] | Freq: Four times a day (QID) | 12 refills | Status: DC

## 2017-09-28 NOTE — Discharge Instructions (Signed)
Nausea and Vomiting, Adult Feeling sick to your stomach (nausea) means that your stomach is upset or you feel like you have to throw up (vomit). Feeling more and more sick to your stomach can lead to throwing up. Throwing up happens when food and liquid from your stomach are thrown up and out the mouth. Throwing up can make you feel weak and cause you to get dehydrated. Dehydration can make you tired and thirsty, make you have a dry mouth, and make it so you pee (urinate) less often. Older adults and people with other diseases or a weak defense system (immune system) are at higher risk for dehydration. If you feel sick to your stomach or if you throw up, it is important to follow instructions from your doctor about how to take care of yourself. Follow these instructions at home: Eating and drinking Follow these instructions as told by your doctor:  Take an oral rehydration solution (ORS). This is a drink that is sold at pharmacies and stores.  Drink clear fluids in small amounts as you are able, such as: ? Water. ? Ice chips. ? Diluted fruit juice. ? Low-calorie sports drinks.  Eat bland, easy-to-digest foods in small amounts as you are able, such as: ? Bananas. ? Applesauce. ? Rice. ? Low-fat (lean) meats. ? Toast. ? Crackers.  Avoid fluids that have a lot of sugar or caffeine in them.  Avoid alcohol.  Avoid spicy or fatty foods.  General instructions  Drink enough fluid to keep your pee (urine) clear or pale yellow.  Wash your hands often. If you cannot use soap and water, use hand sanitizer.  Make sure that all people in your home wash their hands well and often.  Take over-the-counter and prescription medicines only as told by your doctor.  Rest at home while you get better.  Watch your condition for any changes.  Breathe slowly and deeply when you feel sick to your stomach.  Keep all follow-up visits as told by your doctor. This is important. Contact a doctor  if:  You have a fever.  You cannot keep fluids down.  Your symptoms get worse.  You have new symptoms.  You feel sick to your stomach for more than two days.  You feel light-headed or dizzy.  You have a headache.  You have muscle cramps. Get help right away if:  You have pain in your chest, neck, arm, or jaw.  You feel very weak or you pass out (faint).  You throw up again and again.  You see blood in your throw-up.  Your throw-up looks like black coffee grounds.  You have bloody or black poop (stools) or poop that look like tar.  You have a very bad headache, a stiff neck, or both.  You have a rash.  You have very bad pain, cramping, or bloating in your belly (abdomen).  You have trouble breathing.  You are breathing very quickly.  Your heart is beating very quickly.  Your skin feels cold and clammy.  You feel confused.  You have pain when you pee.  You have signs of dehydration, such as: ? Dark pee, hardly any pee, or no pee. ? Cracked lips. ? Dry mouth. ? Sunken eyes. ? Sleepiness. ? Weakness. These symptoms may be an emergency. Do not wait to see if the symptoms will go away. Get medical help right away. Call your local emergency services (911 in the [place] Do not drive yourself to the hospital. This information is   not intended to replace advice given to you by your health care provider. Make sure you discuss any questions you have with your health care provider. Document Released: 08/23/2007 Document Revised: 09/24/2015 Document Reviewed: 11/10/2014 Elsevier Interactive Patient Education  2018 [employer]  

## 2017-09-28 NOTE — MAU Provider Note (Signed)
Chief Complaint:  Emesis   First Provider Initiated Contact with Patient 09/28/17 0041     HPI: Tracey [name] is a 42 y.o. [id number] at 2327w6dwho presents to maternity admissions reporting vomiting once at office after Glucose test.  Then vomited once at home.  States just feel "not that good".. She reports good fetal movement, denies LOF, vaginal bleeding, vaginal itching/burning, urinary symptoms, h/a, dizziness, diarrhea, constipation or fever/chills.    Emesis   This is a new problem. The current episode started today. The problem occurs 2 to 4 times per day. The problem has been gradually improving. There has been no fever. Pertinent negatives include no abdominal pain, chills, diarrhea, dizziness, fever, headaches or myalgias. She has tried nothing for the symptoms.   RN Note: [facility]   [facility].   THEN TOOK AGAIN.  THEN VOMITED AGAIN  1030PM.    SAYS WAS OK UNTIL TEST.      Past Medical History: Past Medical History:  Diagnosis Date  . Anxiety   . Bronchitis   . Carpal tunnel syndrome on both sides   . Depression   . Family history of adverse reaction to anesthesia    mother had problems waking up from surgery.  . Headache    migraines  . Hypertension   . Medical history non-contributory   . Panic attack 2017   diagnosed 3 months ago. no meds presently  . Reflux    did not filll prescription    Past obstetric history: OB History  Gravida Para Term Preterm AB Living  5 2 2  0 2 2  SAB TAB Ectopic Multiple Live Births  2 0 0 0 2    # Outcome Date GA Lbr Len/2nd Weight Sex Delivery Anes PTL Lv  5 Current           4 SAB 08/09/15 527w0d         3 Term 03/23/05 4472w4d  3 lb 12 oz (1.701 kg) F Vag-Spont   LIV     Complications: IUGR (intrauterine growth restriction) affecting care of mother  2 SAB 2004          1 Term 08/06/96   7 lb (3.175 kg) M Vag-Spont   LIV    Past Surgical History: Past Surgical History:  Procedure  Laterality Date  . BREAST BIOPSY  right    cyst  . DILATION AND EVACUATION N/A 07/26/2015   Procedure: DILATATION AND EVACUATION;  Surgeon: Brock Badharles A Harper, MD;  Location: [facility];  Service: Gynecology;  Laterality: N/A;    Family History: Family History  Problem Relation Age of Onset  . Hypertension Mother   . Diabetes Maternal Aunt   . Diabetes Maternal Uncle     Social History: Social History   Tobacco Use  . Smoking status: Current Some Day Smoker    Packs/day: 0.25    Types: Cigarettes  . Smokeless tobacco: Never Used  . Tobacco comment: 2 cig/day  Substance Use Topics  . Alcohol use: No    Alcohol/week: 0.0 oz  . Drug use: No    Allergies: No Known Allergies  Meds:  Medications Prior to Admission  Medication Sig Dispense Refill Last Dose  . aspirin 81 MG chewable tablet Chew 1 tablet (81 mg total) by mouth daily. 30 tablet 12 09/26/2017 at Unknown time  . Prenatal Vit-FePoly-FA-DHA (VITAFOL-ONE) 29-1-200 MG CAPS Take 1 capsule by mouth daily before breakfast. 90 capsule 3 09/26/2017 at Unknown  time  . Doxylamine-Pyridoxine ER (BONJESTA) 20-20 MG TBCR Take 1 tablet by mouth 2 (two) times daily. (Patient not taking: Reported on 09/06/2017) 60 tablet 6 Not Taking  . Doxylamine-Pyridoxine ER (BONJESTA) 20-20 MG TBCR Take 1 tablet by mouth 2 (two) times daily. (Patient not taking: Reported on 09/06/2017) 60 tablet 6 Not Taking  . Elastic Bandages & Supports (COMFORT FIT MATERNITY SUPP SM) MISC Wear as directed. 1 each 0 Taking  . pantoprazole (PROTONIX) 40 MG tablet Take 1 tablet (40 mg total) by mouth daily. 30 tablet 3 Taking  . Probiotic Product (PROBIOTIC DAILY PO) Take by mouth.   Taking  . Vitamin D, Ergocalciferol, (DRISDOL) 50000 units CAPS capsule Take 1 capsule (50,000 Units total) by mouth every 7 (seven) days. 30 capsule 2 Taking    I have reviewed patient's Past Medical Hx, Surgical Hx, Family Hx, Social Hx, medications and allergies.   ROS:  Review of Systems   Constitutional: Negative for chills and fever.  Gastrointestinal: Positive for vomiting. Negative for abdominal pain and diarrhea.  Musculoskeletal: Negative for myalgias.  Neurological: Negative for dizziness and headaches.   Other systems negative  Physical Exam   Patient Vitals for the past 24 hrs:  BP Temp Temp src Pulse Resp Height Weight  09/27/17 2342 125/74 98.5 F (36.9 C) Oral 95 20 5\' 7"  (1.702 m) 214 lb 4 oz (97.2 kg)   Constitutional: Well-developed, well-nourished female in no acute distress.  Cardiovascular: normal rate and rhythm Respiratory: normal effort, clear to auscultation bilaterally GI: Abd soft, non-tender, gravid appropriate for gestational age.   No rebound or guarding. MS: Extremities nontender, no edema, normal ROM Neurologic: Alert and oriented x 4.  GU: Neg CVAT.  PELVIC EXAM:  deferred  FHT:  Baseline 140 , moderate variability, accelerations present, no decelerations Contractions: Irregular     Labs: Results for orders placed or performed during the hospital encounter of 09/27/17 (from the past 24 hour(s))  Urinalysis, Routine w reflex microscopic     Status: Abnormal   Collection Time: 09/27/17 11:52 PM  Result Value Ref Range   Color, Urine YELLOW YELLOW   APPearance HAZY (A) CLEAR   Specific Gravity, Urine 1.025 1.005 - 1.030   pH 6.0 5.0 - 8.0   Glucose, UA NEGATIVE NEGATIVE mg/dL   Hgb urine dipstick NEGATIVE NEGATIVE   Bilirubin Urine NEGATIVE NEGATIVE   Ketones, ur 80 (A) NEGATIVE mg/dL   Protein, ur 30 (A) NEGATIVE mg/dL   Nitrite NEGATIVE NEGATIVE   Leukocytes, UA MODERATE (A) NEGATIVE   RBC / HPF 0-5 0 - 5 RBC/hpf   WBC, UA 0-5 0 - 5 WBC/hpf   Bacteria, UA RARE (A) NONE SEEN   Squamous Epithelial / LPF 6-10 0 - 5   Mucus PRESENT    B/Positive/-- (03/18 1319)  Imaging:    MAU Course/MDM: I have ordered labs and reviewed results. Urine showed ketones, but patient does not appear orthostatic. Has only vomited twice.   Admits she does not drink much water ever.  Eats ice all day NST reviewed and is reactive  Treatments in MAU included Zofran with PO challenge. Able to keep down fluids after med. .    Assessment: 1. Supervision of high risk pregnancy, antepartum   2.     Nausea and vomiting  Plan: Discharge home Rx Zofran for home PRN use.  May want to take it before trying Glucola again Preterm Labor precautions and fetal kick counts Follow up in Office for prenatal  visits and recheck  Encouraged to return here or to other Urgent Care/ED if she develops worsening of symptoms, increase in pain, fever, or other concerning symptoms.  Pt stable at time of discharge.  Wynelle Bourgeois CNM, MSN Certified Nurse-Midwife 09/28/2017 12:42 AM

## 2017-09-28 NOTE — Addendum Note (Signed)
Addended by: Catalina AntiguaONSTANT, Harlea Goetzinger on: 09/28/2017 11:01 AM   Modules accepted: Orders

## 2017-10-02 ENCOUNTER — Telehealth: Payer: Self-pay

## 2017-10-02 NOTE — Telephone Encounter (Signed)
FMLA Paperwork Addended and faxed.

## 2017-10-08 ENCOUNTER — Encounter: Payer: 59 | Attending: Obstetrics and Gynecology | Admitting: Skilled Nursing Facility1

## 2017-10-08 ENCOUNTER — Encounter: Payer: Self-pay | Admitting: Skilled Nursing Facility1

## 2017-10-08 DIAGNOSIS — O24913 Unspecified diabetes mellitus in pregnancy, third trimester: Secondary | ICD-10-CM | POA: Insufficient documentation

## 2017-10-08 DIAGNOSIS — Z3A27 27 weeks gestation of pregnancy: Secondary | ICD-10-CM | POA: Insufficient documentation

## 2017-10-08 DIAGNOSIS — Z713 Dietary counseling and surveillance: Secondary | ICD-10-CM | POA: Insufficient documentation

## 2017-10-08 NOTE — Progress Notes (Signed)
Diabetes Self-Management Education  Visit Type: First/Initial  10/08/2017  [name], identified by name and date of birth, is a 42 y.o. female with a diagnosis of Diabetes: Gestational Diabetes.   ASSESSMENT  Height 5\' 7"  (1.702 m), weight 213 lb 12.8 oz (97 kg), last menstrual period 03/17/2017, unknown if currently breastfeeding. Body mass index is 33.49 kg/m.  Pt states she ran out of her test strips because she was testing more times in the day when her blood sugars were high to see if they came down pt states she wants to continue this so she will talk to ehr doctor about increasing her strip prescription: Dietitian advised her insurance may be a barrier there.   Pt states she is checking fasting: 97, 104, 159; After eating 2 hours: 184, 135, 11:56am 161. Pt states when her blood sugars are in the 80's she feels sleepy. Pt states when her blood sugar is high her vision seems off.  Pt states she sometimes wakes in the middle of the night to eat: doughnut. Pt states she has lost 3 pounds.   Diabetes Self-Management Education - 10/08/17 0808      Visit Information   Visit Type  First/Initial      Initial Visit   Diabetes Type  Gestational Diabetes    Are you currently following a meal plan?  No    Are you taking your medications as prescribed?  Not on Medications      Health Coping   How would you rate your overall health?  Good      Psychosocial Assessment   Patient Belief/Attitude about Diabetes  Motivated to manage diabetes    Self-management support  Family    Patient Concerns  Nutrition/Meal planning      Pre-Education Assessment   Patient understands the diabetes disease and treatment process.  Needs Instruction    Patient understands incorporating nutritional management into lifestyle.  Needs Instruction    Patient undertands incorporating physical activity into lifestyle.  Needs Instruction    Patient understands using medications safely.  Needs Instruction     Patient understands monitoring blood glucose, interpreting and using results  Needs Instruction    Patient understands prevention, detection, and treatment of acute complications.  Needs Instruction    Patient understands prevention, detection, and treatment of chronic complications.  Needs Instruction    Patient understands how to develop strategies to address psychosocial issues.  Needs Instruction    Patient understands how to develop strategies to promote health/change behavior.  Needs Instruction      Complications   How often do you check your blood sugar?  > 4 times/day    Fasting Blood glucose range (mg/dL)  16-109    Postprandial Blood glucose range (mg/dL)  604-540;98-119    Number of hypoglycemic episodes per month  0    Number of hyperglycemic episodes per week  1    Can you tell when your blood sugar is high?  Yes    What do you do if your blood sugar is high?  drink water    Have you had a dilated eye exam in the past 12 months?  No    Have you had a dental exam in the past 12 months?  No    Are you checking your feet?  N/A      Dietary Intake   Breakfast  oatmeal 2 hours after 215 blood sugar    Snack (morning)  appelsauce    Lunch  sandwich  135    Dinner  grilled pork chops with pinto beans and greeen beans and mashed potatoes 149    Beverage(s)  water, sprite, unsweet tea      Exercise   Exercise Type  ADL's    How many days per week to you exercise?  0    How many minutes per day do you exercise?  0    Total minutes per week of exercise  0      Patient Education   Previous Diabetes Education  No    Disease state   Factors that contribute to the development of diabetes    Nutrition management   Role of diet in the treatment of diabetes and the relationship between the three main macronutrients and blood glucose level;Food label reading, portion sizes and measuring food.;Carbohydrate counting    Physical activity and exercise   Role of exercise on diabetes  management, blood pressure control and cardiac health.;Identified with patient nutritional and/or medication changes necessary with exercise.    Monitoring  Purpose and frequency of SMBG.;Taught/evaluated SMBG meter.    Acute complications  Taught treatment of hypoglycemia - the 15 rule.;Discussed and identified patients' treatment of hyperglycemia.    Chronic complications  Relationship between chronic complications and blood glucose control    Psychosocial adjustment  Role of stress on diabetes;Worked with patient to identify barriers to care and solutions;Helped patient identify a support system for diabetes management    Preconception care  Reviewed with patient blood glucose goals with pregnancy      Individualized Goals (developed by patient)   Nutrition  General guidelines for healthy choices and portions discussed;Adjust meds/carbs with exercise as discussed;Follow meal plan discussed    Physical Activity  Exercise 5-7 days per week;30 minutes per day    Medications  Not Applicable    Monitoring   test my blood glucose as discussed;test blood glucose pre and post meals as discussed      Post-Education Assessment   Patient understands the diabetes disease and treatment process.  Demonstrates understanding / competency    Patient understands incorporating nutritional management into lifestyle.  Demonstrates understanding / competency    Patient undertands incorporating physical activity into lifestyle.  Demonstrates understanding / competency    Patient understands using medications safely.  Demonstrates understanding / competency    Patient understands monitoring blood glucose, interpreting and using results  Demonstrates understanding / competency    Patient understands prevention, detection, and treatment of acute complications.  Demonstrates understanding / competency    Patient understands prevention, detection, and treatment of chronic complications.  Demonstrates understanding /  competency    Patient understands how to develop strategies to address psychosocial issues.  Demonstrates understanding / competency    Patient understands how to develop strategies to promote health/change behavior.  Demonstrates understanding / competency      Outcomes   Expected Outcomes  Demonstrated interest in learning. Expect positive outcomes    Future DMSE  PRN    Program Status  Completed       Individualized Plan for Diabetes Self-Management Training:   Learning Objective:  Patient will have a greater understanding of diabetes self-management. Patient education plan is to attend individual and/or group sessions per assessed needs and concerns.   Plan:   There are no Patient Instructions on file for this visit.  Expected Outcomes:  Demonstrated interest in learning. Expect positive outcomes  Education material provided: Meal plan card, My Plate and Snack sheet  If problems or questions,  patient to contact team via:  Phone  Future DSME appointment: PRN

## 2017-10-10 ENCOUNTER — Ambulatory Visit (INDEPENDENT_AMBULATORY_CARE_PROVIDER_SITE_OTHER): Payer: 59 | Admitting: Obstetrics & Gynecology

## 2017-10-10 VITALS — BP 124/76 | HR 97 | Wt 212.0 lb

## 2017-10-10 DIAGNOSIS — O0993 Supervision of high risk pregnancy, unspecified, third trimester: Secondary | ICD-10-CM

## 2017-10-10 DIAGNOSIS — O24913 Unspecified diabetes mellitus in pregnancy, third trimester: Secondary | ICD-10-CM

## 2017-10-10 DIAGNOSIS — O0990 Supervision of high risk pregnancy, unspecified, unspecified trimester: Secondary | ICD-10-CM

## 2017-10-10 LAB — POCT URINALYSIS DIPSTICK
Bilirubin, UA: NEGATIVE
Blood, UA: NEGATIVE
Glucose, UA: NEGATIVE
Ketones, UA: NEGATIVE
Leukocytes, UA: NEGATIVE
Nitrite, UA: NEGATIVE
Protein, UA: POSITIVE — AB
Spec Grav, UA: 1.01 (ref 1.010–1.025)
pH, UA: 7 (ref 5.0–8.0)

## 2017-10-10 MED ORDER — METFORMIN HCL 500 MG PO TABS: 500.0000 mg | ORAL_TABLET | Freq: Two times a day (BID) | ORAL | 2 refills | Status: DC

## 2017-10-10 MED ORDER — GLUCOSE BLOOD VI STRP: ORAL_STRIP | 12 refills | Status: DC

## 2017-10-10 NOTE — Progress Notes (Signed)
                                                                                                                                                                                                                                                                                                                                                                                                                                                                                                                                                                                                                                                                                                                                                                                                                                                                                                                                                                                                                                                                                                                                                                                                                                                                                                                                                                                                                                                                                                                                                                                                                                                                                                                                                                                                                                                                                                                                                                                                                                                                                                                                                                                                                                                                                                                                                                                                                                                                                                                                                                                                                                                                                                                                                                                                                                                                                                                                                                                                                                                                                                                                                                                                                                                                                                                                                                                                                                                                                                                                                                                                                                                                                                                                                                                                                                                                                                                                                                                                                                                                                                                                                                                                                                                                                                                                                                                                                                                                                                                                                                                                                          00000000[id number]0000000000000000[id number] 

## 2017-10-10 NOTE — Patient Instructions (Signed)

## 2017-10-17 ENCOUNTER — Ambulatory Visit: Payer: 59

## 2017-10-17 ENCOUNTER — Ambulatory Visit (INDEPENDENT_AMBULATORY_CARE_PROVIDER_SITE_OTHER): Payer: 59 | Admitting: Obstetrics and Gynecology

## 2017-10-17 ENCOUNTER — Encounter: Payer: Self-pay | Admitting: Obstetrics and Gynecology

## 2017-10-17 VITALS — BP 123/80 | HR 94 | Wt 213.7 lb

## 2017-10-17 DIAGNOSIS — O10919 Unspecified pre-existing hypertension complicating pregnancy, unspecified trimester: Secondary | ICD-10-CM

## 2017-10-17 DIAGNOSIS — O0990 Supervision of high risk pregnancy, unspecified, unspecified trimester: Secondary | ICD-10-CM

## 2017-10-17 DIAGNOSIS — O24913 Unspecified diabetes mellitus in pregnancy, third trimester: Secondary | ICD-10-CM

## 2017-10-17 DIAGNOSIS — O09523 Supervision of elderly multigravida, third trimester: Secondary | ICD-10-CM

## 2017-10-17 NOTE — Patient Instructions (Signed)
Third Trimester of Pregnancy The third trimester is from week 28 through week 40 (months 7 through 9). The third trimester is a time when the unborn baby (fetus) is growing rapidly. At the end of the ninth month, the fetus is about 20 inches in length and weighs 6-10 pounds. Body changes during your third trimester Your body will continue to go through many changes during pregnancy. The changes vary from woman to woman. During the third trimester:  Your weight will continue to increase. You can expect to gain 25-35 pounds (11-16 kg) by the end of the pregnancy.  You may begin to get stretch marks on your hips, abdomen, and breasts.  You may urinate more often because the fetus is moving lower into your pelvis and pressing on your bladder.  You may develop or continue to have heartburn. This is caused by increased hormones that slow down muscles in the digestive tract.  You may develop or continue to have constipation because increased hormones slow digestion and cause the muscles that push waste through your intestines to relax.  You may develop hemorrhoids. These are swollen veins (varicose veins) in the rectum that can itch or be painful.  You may develop swollen, bulging veins (varicose veins) in your legs.  You may have increased body aches in the pelvis, back, or thighs. This is due to weight gain and increased hormones that are relaxing your joints.  You may have changes in your hair. These can include thickening of your hair, rapid growth, and changes in texture. Some women also have hair loss during or after pregnancy, or hair that feels dry or thin. Your hair will most likely return to normal after your baby is born.  Your breasts will continue to grow and they will continue to become tender. A yellow fluid (colostrum) may leak from your breasts. This is the first milk you are producing for your baby.  Your belly button may stick out.  You may notice more swelling in your hands,  face, or ankles.  You may have increased tingling or numbness in your hands, arms, and legs. The skin on your belly may also feel numb.  You may feel short of breath because of your expanding uterus.  You may have more problems sleeping. This can be caused by the size of your belly, increased need to urinate, and an increase in your body's metabolism.  You may notice the fetus "dropping," or moving lower in your abdomen (lightening).  You may have increased vaginal discharge.  You may notice your joints feel loose and you may have pain around your pelvic bone.  What to expect at prenatal visits You will have prenatal exams every 2 weeks until week 36. Then you will have weekly prenatal exams. During a routine prenatal visit:  You will be weighed to make sure you and the baby are growing normally.  Your blood pressure will be taken.  Your abdomen will be measured to track your baby's growth.  The fetal heartbeat will be listened to.  Any test results from the previous visit will be discussed.  You may have a cervical check near your due date to see if your cervix has softened or thinned (effaced).  You will be tested for Group B streptococcus. This happens between 35 and 37 weeks.  Your health care provider may ask you:  What your birth plan is.  How you are feeling.  If you are feeling the baby move.  If you have had   any abnormal symptoms, such as leaking fluid, bleeding, severe headaches, or abdominal cramping.  If you are using any tobacco products, including cigarettes, chewing tobacco, and electronic cigarettes.  If you have any questions.  Other tests or screenings that may be performed during your third trimester include:  Blood tests that check for low iron levels (anemia).  Fetal testing to check the health, activity level, and growth of the fetus. Testing is done if you have certain medical conditions or if there are problems during the  pregnancy.  Nonstress test (NST). This test checks the health of your baby to make sure there are no signs of problems, such as the baby not getting enough oxygen. During this test, a belt is placed around your belly. The baby is made to move, and its heart rate is monitored during movement.  What is false labor? False labor is a condition in which you feel small, irregular tightenings of the muscles in the womb (contractions) that usually go away with rest, changing position, or drinking water. These are called Braxton Hicks contractions. Contractions may last for hours, days, or even weeks before true labor sets in. If contractions come at regular intervals, become more frequent, increase in intensity, or become painful, you should see your health care provider. What are the signs of labor?  Abdominal cramps.  Regular contractions that start at 10 minutes apart and become stronger and more frequent with time.  Contractions that start on the top of the uterus and spread down to the lower abdomen and back.  Increased pelvic pressure and dull back pain.  A watery or bloody mucus discharge that comes from the vagina.  Leaking of amniotic fluid. This is also known as your "water breaking." It could be a slow trickle or a gush. Let your health care provider know if it has a color or strange odor. If you have any of these signs, call your health care provider right away, even if it is before your due date. Follow these instructions at home: Medicines  Follow your health care provider's instructions regarding medicine use. Specific medicines may be either safe or unsafe to take during pregnancy.  Take a prenatal vitamin that contains at least 600 micrograms (mcg) of folic acid.  If you develop constipation, try taking a stool softener if your health care provider approves. Eating and drinking  Eat a balanced diet that includes fresh fruits and vegetables, whole grains, good sources of protein  such as meat, eggs, or tofu, and low-fat dairy. Your health care provider will help you determine the amount of weight gain that is right for you.  Avoid raw meat and uncooked cheese. These carry germs that can cause birth defects in the baby.  If you have low calcium intake from food, talk to your health care provider about whether you should take a daily calcium supplement.  Eat four or five small meals rather than three large meals a day.  Limit foods that are high in fat and processed sugars, such as fried and sweet foods.  To prevent constipation: ? Drink enough fluid to keep your urine clear or pale yellow. ? Eat foods that are high in fiber, such as fresh fruits and vegetables, whole grains, and beans. Activity  Exercise only as directed by your health care provider. Most women can continue their usual exercise routine during pregnancy. Try to exercise for 30 minutes at least 5 days a week. Stop exercising if you experience uterine contractions.  Avoid heavy   lifting.  Do not exercise in extreme heat or humidity, or at high altitudes.  Wear low-heel, comfortable shoes.  Practice good posture.  You may continue to have sex unless your health care provider tells you otherwise. Relieving pain and discomfort  Take frequent breaks and rest with your legs elevated if you have leg cramps or low back pain.  Take warm sitz baths to soothe any pain or discomfort caused by hemorrhoids. Use hemorrhoid cream if your health care provider approves.  Wear a good support bra to prevent discomfort from breast tenderness.  If you develop varicose veins: ? Wear support pantyhose or compression stockings as told by your healthcare provider. ? Elevate your feet for 15 minutes, 3-4 times a day. Prenatal care  Write down your questions. Take them to your prenatal visits.  Keep all your prenatal visits as told by your health care provider. This is important. Safety  Wear your seat belt at  all times when driving.  Make a list of emergency phone numbers, including numbers for family, friends, the hospital, and police and fire departments. General instructions  Avoid cat litter boxes and soil used by cats. These carry germs that can cause birth defects in the baby. If you have a cat, ask someone to clean the litter box for you.  Do not travel far distances unless it is absolutely necessary and only with the approval of your health care provider.  Do not use hot tubs, steam rooms, or saunas.  Do not drink alcohol.  Do not use any products that contain nicotine or tobacco, such as cigarettes and e-cigarettes. If you need help quitting, ask your health care provider.  Do not use any medicinal herbs or unprescribed drugs. These chemicals affect the formation and growth of the baby.  Do not douche or use tampons or scented sanitary pads.  Do not cross your legs for long periods of time.  To prepare for the arrival of your baby: ? Take prenatal classes to understand, practice, and ask questions about labor and delivery. ? Make a trial run to the hospital. ? Visit the hospital and tour the maternity area. ? Arrange for maternity or paternity leave through employers. ? Arrange for family and friends to take care of pets while you are in the hospital. ? Purchase a rear-facing car seat and make sure you know how to install it in your car. ? Pack your hospital bag. ? Prepare the baby's nursery. Make sure to remove all pillows and stuffed animals from the baby's crib to prevent suffocation.  Visit your dentist if you have not gone during your pregnancy. Use a soft toothbrush to brush your teeth and be gentle when you floss. Contact a health care provider if:  You are unsure if you are in labor or if your water has broken.  You become dizzy.  You have mild pelvic cramps, pelvic pressure, or nagging pain in your abdominal area.  You have lower back pain.  You have persistent  nausea, vomiting, or diarrhea.  You have an unusual or bad smelling vaginal discharge.  You have pain when you urinate. Get help right away if:  Your water breaks before 37 weeks.  You have regular contractions less than 5 minutes apart before 37 weeks.  You have a fever.  You are leaking fluid from your vagina.  You have spotting or bleeding from your vagina.  You have severe abdominal pain or cramping.  You have rapid weight loss or weight gain.    You have shortness of breath with chest pain.  You notice sudden or extreme swelling of your face, hands, ankles, feet, or legs.  Your baby makes fewer than 10 movements in 2 hours.  You have severe headaches that do not go away when you take medicine.  You have vision changes. Summary  The third trimester is from week 28 through week 40, months 7 through 9. The third trimester is a time when the unborn baby (fetus) is growing rapidly.  During the third trimester, your discomfort may increase as you and your baby continue to gain weight. You may have abdominal, leg, and back pain, sleeping problems, and an increased need to urinate.  During the third trimester your breasts will keep growing and they will continue to become tender. A yellow fluid (colostrum) may leak from your breasts. This is the first milk you are producing for your baby.  False labor is a condition in which you feel small, irregular tightenings of the muscles in the womb (contractions) that eventually go away. These are called Braxton Hicks contractions. Contractions may last for hours, days, or even weeks before true labor sets in.  Signs of labor can include: abdominal cramps; regular contractions that start at 10 minutes apart and become stronger and more frequent with time; watery or bloody mucus discharge that comes from the vagina; increased pelvic pressure and dull back pain; and leaking of amniotic fluid. This information is not intended to replace advice  given to you by your health care provider. Make sure you discuss any questions you have with your health care provider. Document Released: 02/28/2001 Document Revised: 08/12/2015 Document Reviewed: 05/07/2012 Elsevier Interactive Patient Education  2017 [employer]  

## 2017-10-17 NOTE — Progress Notes (Signed)
Pt has copy of blood sugar log Last night pt noticed blurred vision, swelling in feet.

## 2017-10-17 NOTE — Progress Notes (Signed)
Subjective:  Tracey BiddingCindy [name] is a 42 y.o. [id number] at 2823w4d being seen today for ongoing prenatal care.  She is currently monitored for the following issues for this high-risk pregnancy and has Chronic hypertension during pregnancy, antepartum; Anxiety, generalized; Supervision of high risk pregnancy, antepartum; History of prior pregnancy with IUGR newborn; Vitamin D deficiency; AMA (advanced maternal age) multigravida 35+; Carpal tunnel syndrome during pregnancy; Heartburn during pregnancy, antepartum; and Diabetes mellitus affecting pregnancy in third trimester on their problem list.  Patient reports no complaints.  Contractions: Not present. Vag. Bleeding: None.  Movement: Present. Denies leaking of fluid.   The following portions of the patient's history were reviewed and updated as appropriate: allergies, current medications, past family history, past medical history, past social history, past surgical history and problem list. Problem list updated.  Objective:   Vitals:   10/17/17 1057  BP: 123/80  Pulse: 94  Weight: 213 lb 11.2 oz (96.9 kg)    Fetal Status: Fetal Heart Rate (bpm): 142   Movement: Present     General:  Alert, oriented and cooperative. Patient is in no acute distress.  Skin: Skin is warm and dry. No rash noted.   Cardiovascular: Normal heart rate noted  Respiratory: Normal respiratory effort, no problems with respiration noted  Abdomen: Soft, gravid, appropriate for gestational age. Pain/Pressure: Absent     Pelvic:  Cervical exam deferred        Extremities: Normal range of motion.  Edema: Trace  Mental Status: Normal mood and affect. Normal behavior. Normal judgment and thought content.   Urinalysis:      Assessment and Plan:  Pregnancy: [id number] at 6823w4d  1. Supervision of high risk pregnancy, antepartum Stable  2. Diabetes mellitus affecting pregnancy in third trimester CBG's close to goal range. Some are in goal range Diet and exersize reviewed with  pt. Will increase Metformin to 1000 mg bid Growth scan this Friday F/U in 1 week to check response to increase in Metformin and schedule antenatal testing  3. Chronic hypertension during pregnancy, antepartum BP stable without meds Continue with BASA  4. Multigravida of advanced maternal age in third trimester Nl Panorama  Preterm labor symptoms and general obstetric precautions including but not limited to vaginal bleeding, contractions, leaking of fluid and fetal movement were reviewed in detail with the patient. Please refer to After Visit Summary for other counseling recommendations.  Return in about 1 week (around 10/24/2017) for OB visit.   Tracey [name], [name], MD

## 2017-10-19 ENCOUNTER — Ambulatory Visit (HOSPITAL_COMMUNITY)
Admission: RE | Admit: 2017-10-19 | Discharge: 2017-10-19 | Disposition: A | Discharge status: Home / Self Care | Payer: 59 | Source: Ambulatory Visit | Attending: Certified Nurse Midwife | Admitting: Certified Nurse Midwife

## 2017-10-19 ENCOUNTER — Encounter (HOSPITAL_COMMUNITY): Payer: Self-pay

## 2017-10-19 ENCOUNTER — Other Ambulatory Visit (HOSPITAL_COMMUNITY): Payer: Self-pay | Admitting: *Deleted

## 2017-10-19 ENCOUNTER — Other Ambulatory Visit (HOSPITAL_COMMUNITY): Payer: Self-pay | Admitting: Maternal and Fetal Medicine

## 2017-10-19 DIAGNOSIS — O10013 Pre-existing essential hypertension complicating pregnancy, third trimester: Secondary | ICD-10-CM | POA: Insufficient documentation

## 2017-10-19 DIAGNOSIS — O09293 Supervision of pregnancy with other poor reproductive or obstetric history, third trimester: Secondary | ICD-10-CM | POA: Diagnosis not present

## 2017-10-19 DIAGNOSIS — O09523 Supervision of elderly multigravida, third trimester: Secondary | ICD-10-CM

## 2017-10-19 DIAGNOSIS — Z3A30 30 weeks gestation of pregnancy: Secondary | ICD-10-CM

## 2017-10-19 DIAGNOSIS — O99213 Obesity complicating pregnancy, third trimester: Secondary | ICD-10-CM | POA: Diagnosis not present

## 2017-10-19 DIAGNOSIS — Z362 Encounter for other antenatal screening follow-up: Secondary | ICD-10-CM | POA: Diagnosis not present

## 2017-10-19 DIAGNOSIS — O10913 Unspecified pre-existing hypertension complicating pregnancy, third trimester: Secondary | ICD-10-CM

## 2017-10-19 DIAGNOSIS — O10919 Unspecified pre-existing hypertension complicating pregnancy, unspecified trimester: Secondary | ICD-10-CM

## 2017-10-19 DIAGNOSIS — E669 Obesity, unspecified: Secondary | ICD-10-CM | POA: Diagnosis not present

## 2017-10-19 IMAGING — US US MFM OB FOLLOW-UP
1 series · 12 of 28 positions shown · non-contrast
Comparison: none

[Series 1: us mfm ob follow-up · 12 of 38 slices shown]
[im 2/38]
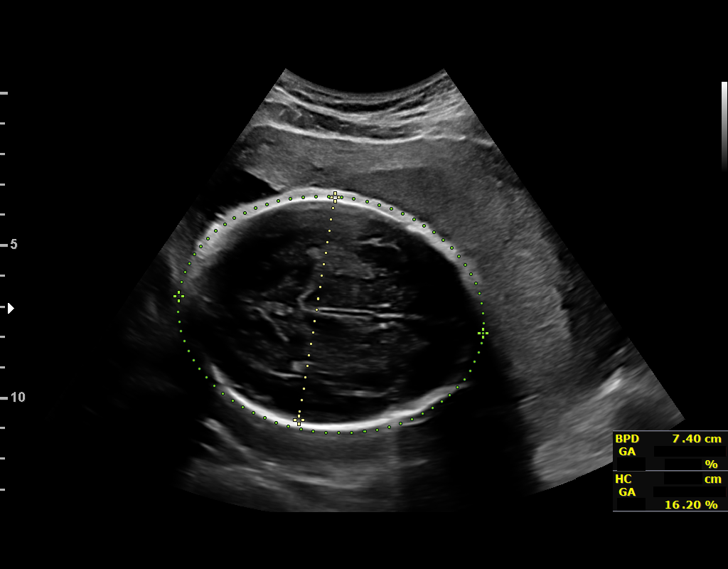
[im 5/38]
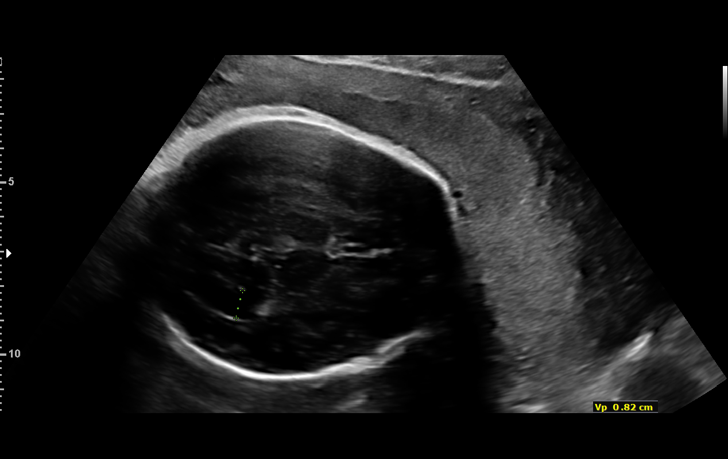
[im 7/38]
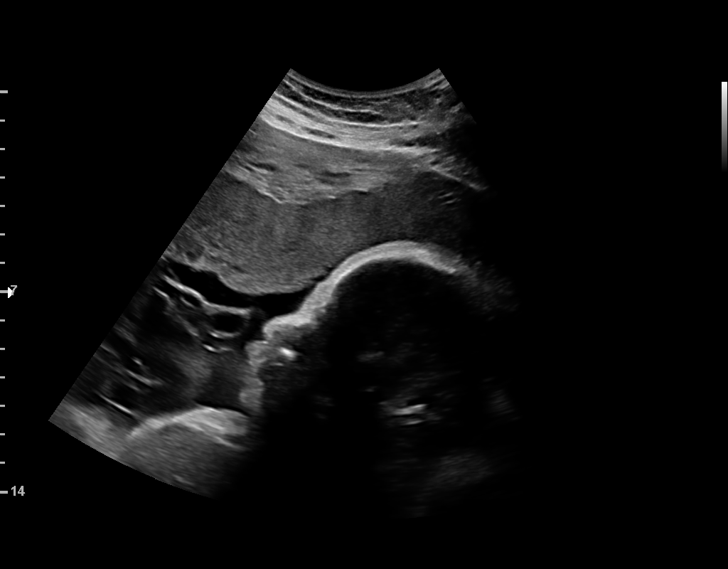
[im 11/38]
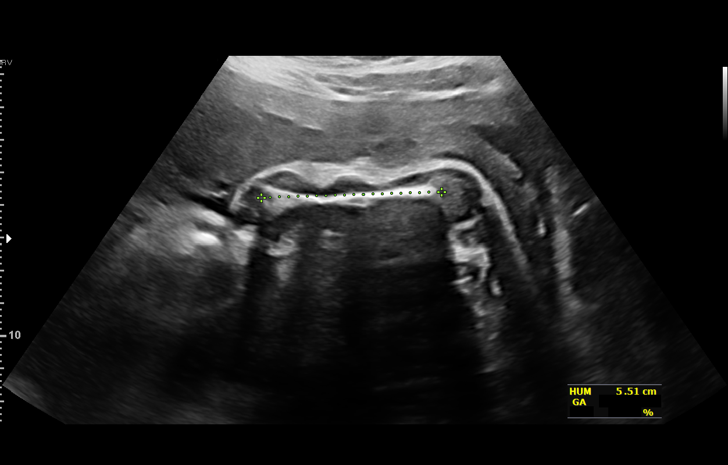
[im 14/38]
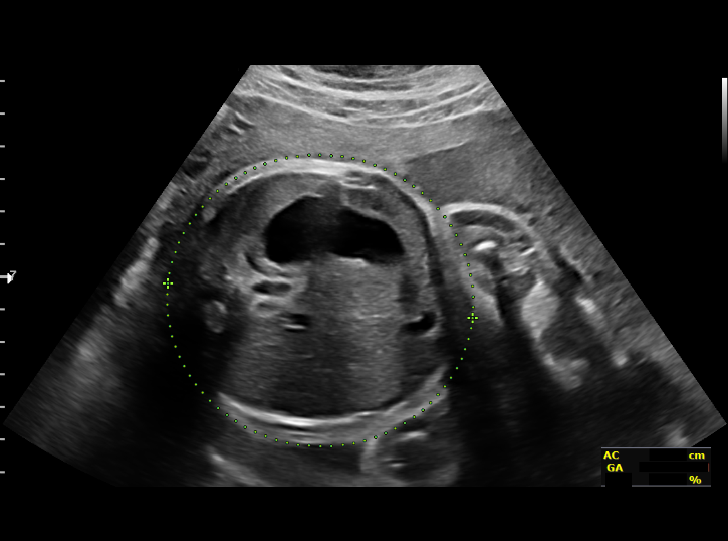
[im 17/38]
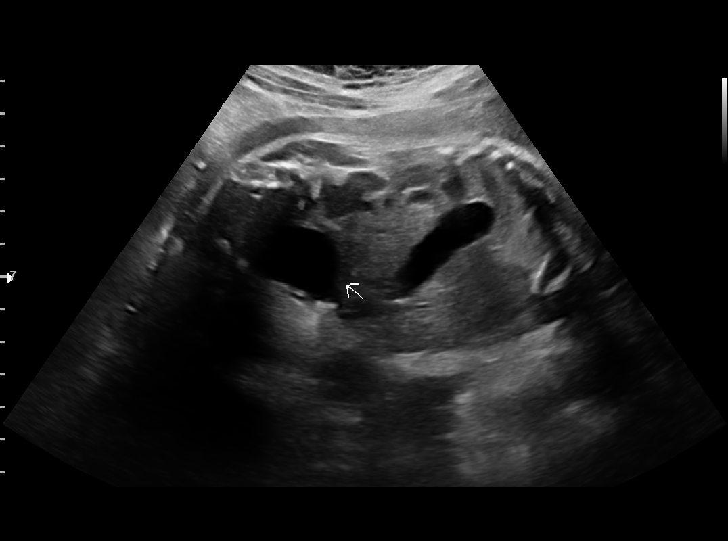
[im 21/38]
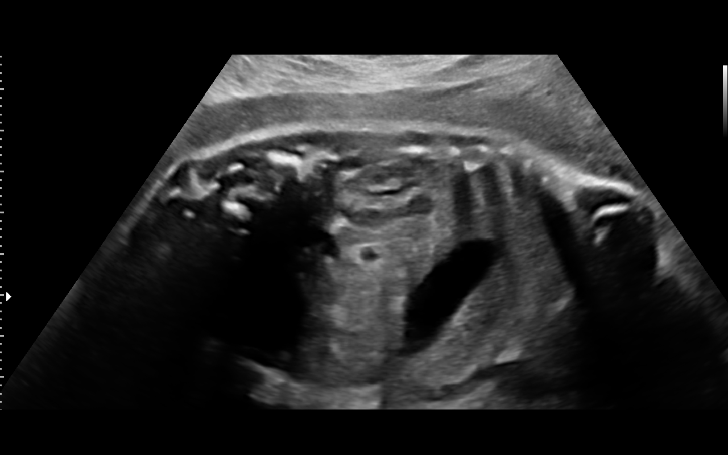
[im 24/38]
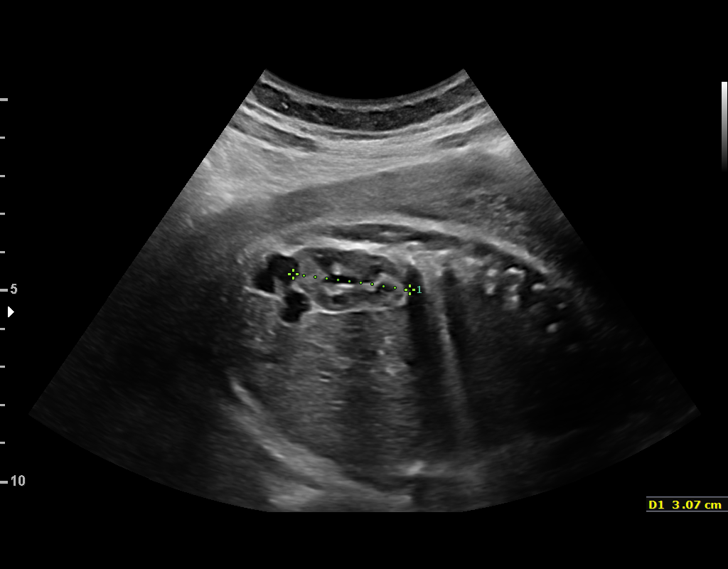
[im 27/38]
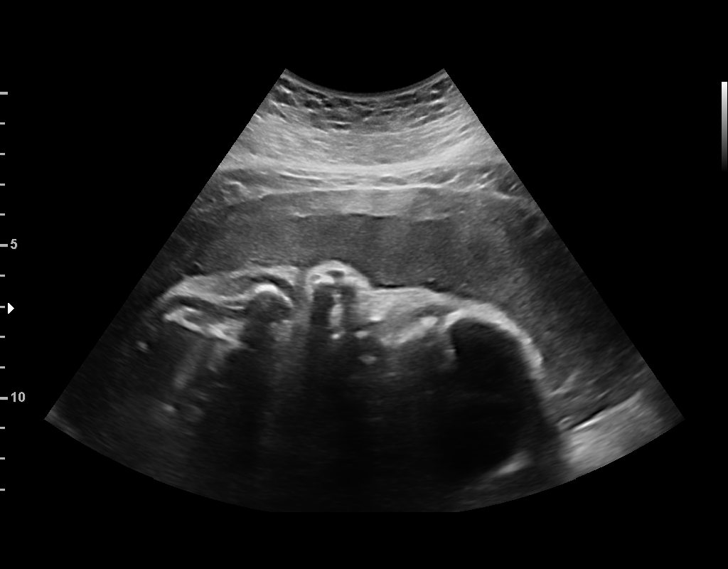
[im 31/38]
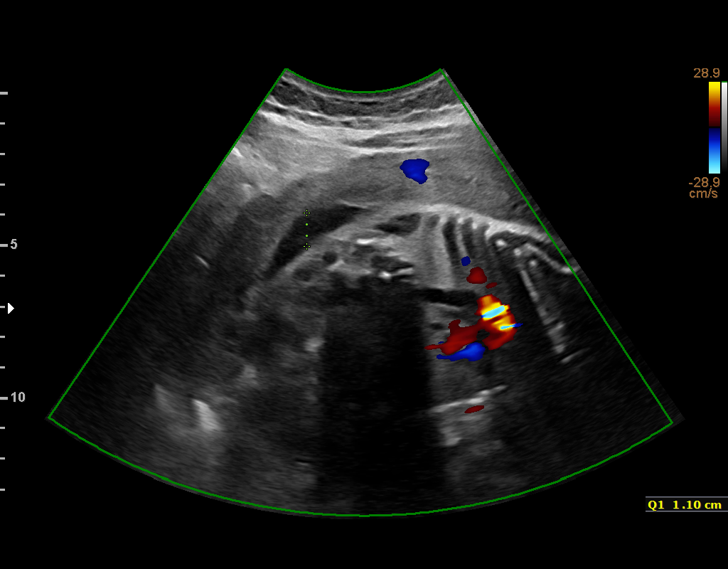
[im 33/38]
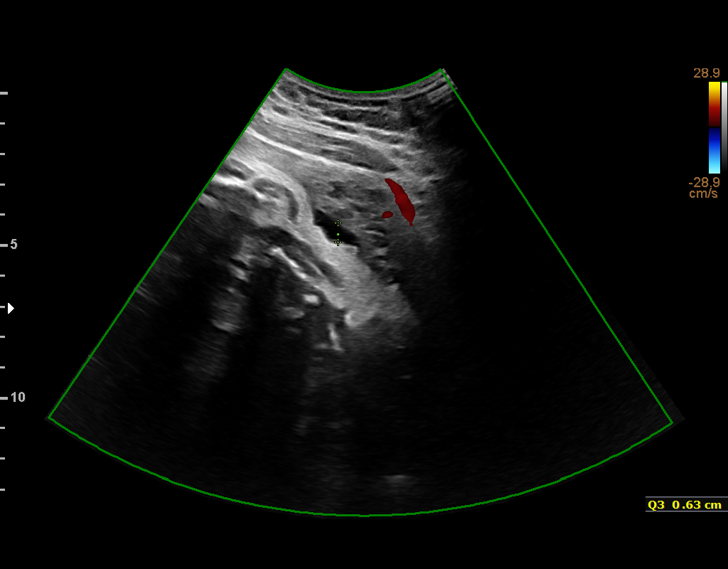
[im 36/38]
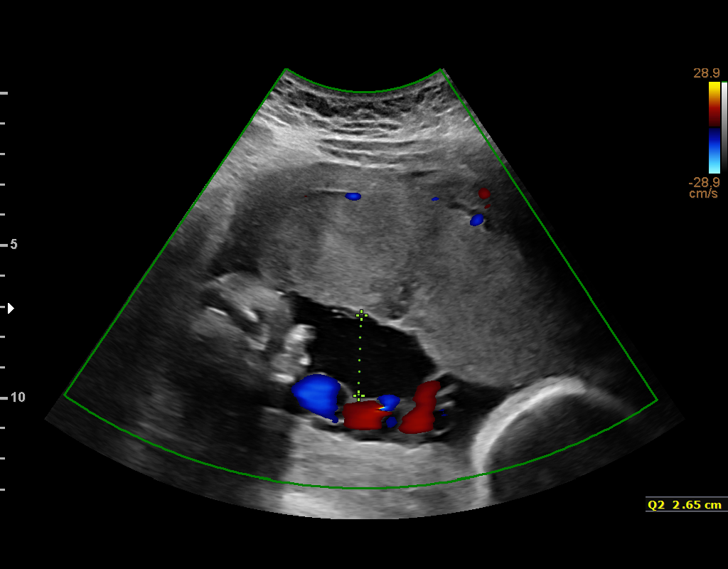

[12 of 28 positions shown; findings below may reference images not displayed]

[REDACTED]care - [HOSPITAL]

1  YAMI SPEAKS            [id number]      [id number]     [id number]
Indications

30 weeks gestation of pregnancy
Encounter for other antenatal screening
follow-up
Poor obstetric history: Previous fetal growth
restriction (FGR)
Hypertension - Chronic/Pre-existing (ASA)
Obesity complicating pregnancy, third
trimester (pregravid BMI 33)
Advanced maternal age multigravida 39,
third trimester (low risk NIPS)
OB History

Gravidity:    5         Term:   2        Prem:   0        SAB:   2
TOP:          0       Ectopic:  0        Living: 2
Fetal Evaluation

Num Of Fetuses:     1
Fetal Heart         150
Rate(bpm):
Cardiac Activity:   Observed
Presentation:       Cephalic
Placenta:           Anterior
P. Cord Insertion:  Previously Visualized

Amniotic Fluid
AFI FV:      Subjectively within normal limits

AFI Sum(cm)     %Tile       Largest Pocket(cm)
8.72            5
RUQ(cm)       RLQ(cm)       LUQ(cm)        LLQ(cm)
2.88
Biometry

BPD:      73.4  mm     G. Age:  29w 3d          7  %    CI:        72.18   %    70 - 86
FL/HC:      22.5   %    19.3 -
HC:      274.9  mm     G. Age:  30w 0d          5  %    HC/AC:      0.96        0.96 -
AC:      286.3  mm     G. Age:  32w 4d         90  %    FL/BPD:     84.3   %    71 - 87
FL:       61.9  mm     G. Age:  32w 0d         70  %    FL/AC:      21.6   %    20 - 24
HUM:      54.7  mm     G. Age:  31w 6d         72  %

Est. FW:    8157  gm      4 lb 2 oz     75  %
Gestational Age

LMP:           30w 6d        Date:  03/17/17                 EDD:   12/22/17
U/S Today:     31w 0d                                        EDD:   12/21/17
Best:          30w 6d     Det. By:  LMP  (03/17/17)          EDD:   12/22/17
Anatomy



Other:  Female gender prev seen. Heels and 5th digit prev. visualized. Nasal
bone prev.  visualized. Technically difficult due to advanced GA and
fetal position.
Cervix Uterus Adnexa

Cervix
Not visualized (advanced GA >57wks)
Impression

Chronic hypertension. Well-controlled without
antihypertensives.
Amniotic fluid is normal and good fetal activity is seen. Fetal
growth is appropriate for gestational age.
Recommendations

-An appointment was made for her to return in 4 weeks for
fetal growth assessment.
-Weekly antenatal testing from 36 weeks till delivery.

## 2017-10-25 ENCOUNTER — Encounter: Payer: Self-pay | Admitting: Obstetrics and Gynecology

## 2017-10-25 ENCOUNTER — Ambulatory Visit (INDEPENDENT_AMBULATORY_CARE_PROVIDER_SITE_OTHER): Payer: 59 | Admitting: Obstetrics and Gynecology

## 2017-10-25 VITALS — BP 125/81 | HR 90 | Wt 208.5 lb

## 2017-10-25 DIAGNOSIS — O09523 Supervision of elderly multigravida, third trimester: Secondary | ICD-10-CM

## 2017-10-25 DIAGNOSIS — O0990 Supervision of high risk pregnancy, unspecified, unspecified trimester: Secondary | ICD-10-CM

## 2017-10-25 DIAGNOSIS — O24913 Unspecified diabetes mellitus in pregnancy, third trimester: Secondary | ICD-10-CM

## 2017-10-25 DIAGNOSIS — O10919 Unspecified pre-existing hypertension complicating pregnancy, unspecified trimester: Secondary | ICD-10-CM

## 2017-10-25 NOTE — Progress Notes (Signed)
   PRENATAL VISIT NOTE  Subjective:  Tracey BiddingCindy [name] is a 42 y.o. [id number] at 1152w5d being seen today for ongoing prenatal care.  She is currently monitored for the following issues for this high-risk pregnancy and has Chronic hypertension during pregnancy, antepartum; Anxiety, generalized; Supervision of high risk pregnancy, antepartum; History of prior pregnancy with IUGR newborn; Vitamin D deficiency; AMA (advanced maternal age) multigravida 35+; Carpal tunnel syndrome during pregnancy; Heartburn during pregnancy, antepartum; and Diabetes mellitus affecting pregnancy in third trimester on their problem list.  Patient reports no complaints.  Contractions: Irritability. Vag. Bleeding: None.  Movement: Present. Denies leaking of fluid.   The following portions of the patient's history were reviewed and updated as appropriate: allergies, current medications, past family history, past medical history, past social history, past surgical history and problem list. Problem list updated.  Objective:   Vitals:   10/25/17 0948 10/25/17 0951  BP: 139/79 125/81  Pulse: 90 90  Weight: 208 lb 8 oz (94.6 kg)     Fetal Status: Fetal Heart Rate (bpm): 156   Movement: Present     General:  Alert, oriented and cooperative. Patient is in no acute distress.  Skin: Skin is warm and dry. No rash noted.   Cardiovascular: Normal heart rate noted  Respiratory: Normal respiratory effort, no problems with respiration noted  Abdomen: Soft, gravid, appropriate for gestational age.  Pain/Pressure: Absent     Pelvic: Cervical exam deferred        Extremities: Normal range of motion.  Edema: Trace  Mental Status: Normal mood and affect. Normal behavior. Normal judgment and thought content.   Assessment and Plan:  Pregnancy: [id number] at 7252w5d  1. Supervision of high risk pregnancy, antepartum Patient is doing well without complaints  2. Diabetes mellitus affecting pregnancy in third trimester CBGs reviewed and all  within range Continue metformin 1000 BID Will start antenatal testing next week - US MFM FETAL BPP WO NON STRESS; Future  3. Chronic hypertension during pregnancy, antepartum Normotensive without medication Continue ASA - US MFM FETAL BPP WO NON STRESS; Future  4. Multigravida of advanced maternal age in third trimester Low risks NIPS  Preterm labor symptoms and general obstetric precautions including but not limited to vaginal bleeding, contractions, leaking of fluid and fetal movement were reviewed in detail with the patient. Please refer to After Visit Summary for other counseling recommendations.  Return in about 1 week (around 11/01/2017) for ROB, NST.  Future Appointments  Date Time Provider Department Center  11/16/2017 10:30 AM [facility] US 5 [facility]    Catalina AntiguaPeggy Shyheim Tanney, MD

## 2017-10-25 NOTE — Progress Notes (Signed)
Pt is G5P2 1349w5d here for ROB. Gestational diabetes, on metformin 2x daily. Pt reports BG levels have been good. Pt also has CHTN, on BASA daily (pt reports she has not taken it today). Pt reports that she has been experiencing HA's and a little blurry vision.

## 2017-10-29 ENCOUNTER — Encounter (HOSPITAL_COMMUNITY): Payer: Self-pay

## 2017-10-29 ENCOUNTER — Inpatient Hospital Stay (HOSPITAL_BASED_OUTPATIENT_CLINIC_OR_DEPARTMENT_OTHER): Payer: 59

## 2017-10-29 ENCOUNTER — Other Ambulatory Visit: Payer: Self-pay

## 2017-10-29 ENCOUNTER — Inpatient Hospital Stay (HOSPITAL_COMMUNITY)
Admission: AD | Admit: 2017-10-29 | Discharge: 2017-10-29 | Disposition: A | Discharge status: Home / Self Care | Payer: 59 | Source: Ambulatory Visit | Attending: Obstetrics and Gynecology | Admitting: Obstetrics and Gynecology

## 2017-10-29 DIAGNOSIS — O99333 Smoking (tobacco) complicating pregnancy, third trimester: Secondary | ICD-10-CM | POA: Insufficient documentation

## 2017-10-29 DIAGNOSIS — O09293 Supervision of pregnancy with other poor reproductive or obstetric history, third trimester: Secondary | ICD-10-CM

## 2017-10-29 DIAGNOSIS — O288 Other abnormal findings on antenatal screening of mother: Secondary | ICD-10-CM

## 2017-10-29 DIAGNOSIS — Z0371 Encounter for suspected problem with amniotic cavity and membrane ruled out: Secondary | ICD-10-CM

## 2017-10-29 DIAGNOSIS — Z3A32 32 weeks gestation of pregnancy: Secondary | ICD-10-CM | POA: Insufficient documentation

## 2017-10-29 DIAGNOSIS — O10913 Unspecified pre-existing hypertension complicating pregnancy, third trimester: Secondary | ICD-10-CM | POA: Insufficient documentation

## 2017-10-29 DIAGNOSIS — Z7982 Long term (current) use of aspirin: Secondary | ICD-10-CM | POA: Diagnosis not present

## 2017-10-29 DIAGNOSIS — O98813 Other maternal infectious and parasitic diseases complicating pregnancy, third trimester: Secondary | ICD-10-CM | POA: Insufficient documentation

## 2017-10-29 DIAGNOSIS — B373 Candidiasis of vulva and vagina: Secondary | ICD-10-CM | POA: Diagnosis not present

## 2017-10-29 DIAGNOSIS — Z8249 Family history of ischemic heart disease and other diseases of the circulatory system: Secondary | ICD-10-CM | POA: Diagnosis not present

## 2017-10-29 DIAGNOSIS — O09523 Supervision of elderly multigravida, third trimester: Secondary | ICD-10-CM | POA: Insufficient documentation

## 2017-10-29 DIAGNOSIS — R102 Pelvic and perineal pain: Secondary | ICD-10-CM | POA: Diagnosis present

## 2017-10-29 DIAGNOSIS — O10013 Pre-existing essential hypertension complicating pregnancy, third trimester: Secondary | ICD-10-CM | POA: Diagnosis not present

## 2017-10-29 DIAGNOSIS — O289 Unspecified abnormal findings on antenatal screening of mother: Secondary | ICD-10-CM | POA: Diagnosis not present

## 2017-10-29 DIAGNOSIS — F17210 Nicotine dependence, cigarettes, uncomplicated: Secondary | ICD-10-CM | POA: Insufficient documentation

## 2017-10-29 DIAGNOSIS — B3731 Acute candidiasis of vulva and vagina: Secondary | ICD-10-CM

## 2017-10-29 LAB — COMPREHENSIVE METABOLIC PANEL
ALK PHOS: 121 U/L (ref 38–126)
ALT: 16 U/L (ref 0–44)
ANION GAP: 10 (ref 5–15)
AST: 16 U/L (ref 15–41)
Albumin: 3.1 g/dL — ABNORMAL LOW (ref 3.5–5.0)
BUN: 10 mg/dL (ref 6–20)
CALCIUM: 8.8 mg/dL — AB (ref 8.9–10.3)
CHLORIDE: 104 mmol/L (ref 98–111)
CO2: 17 mmol/L — ABNORMAL LOW (ref 22–32)
CREATININE: 0.71 mg/dL (ref 0.44–1.00)
Glucose, Bld: 99 mg/dL (ref 70–99)
Potassium: 3.6 mmol/L (ref 3.5–5.1)
SODIUM: 131 mmol/L — AB (ref 135–145)
Total Bilirubin: 0.6 mg/dL (ref 0.3–1.2)
Total Protein: 6.3 g/dL — ABNORMAL LOW (ref 6.5–8.1)

## 2017-10-29 LAB — URINALYSIS, ROUTINE W REFLEX MICROSCOPIC
BILIRUBIN URINE: NEGATIVE
GLUCOSE, UA: NEGATIVE mg/dL
Hgb urine dipstick: NEGATIVE
KETONES UR: 80 mg/dL — AB
Nitrite: NEGATIVE
PH: 6 (ref 5.0–8.0)
Protein, ur: 30 mg/dL — AB
SPECIFIC GRAVITY, URINE: 1.025 (ref 1.005–1.030)

## 2017-10-29 LAB — CBC
HCT: 32 % — ABNORMAL LOW (ref 36.0–46.0)
Hemoglobin: 11 g/dL — ABNORMAL LOW (ref 12.0–15.0)
MCH: 29.7 pg (ref 26.0–34.0)
MCHC: 34.4 g/dL (ref 30.0–36.0)
MCV: 86.5 fL (ref 78.0–100.0)
PLATELETS: 283 10*3/uL (ref 150–400)
RBC: 3.7 MIL/uL — AB (ref 3.87–5.11)
RDW: 14.1 % (ref 11.5–15.5)
WBC: 10.2 10*3/uL (ref 4.0–10.5)

## 2017-10-29 LAB — PROTEIN / CREATININE RATIO, URINE
Creatinine, Urine: 252 mg/dL
Protein Creatinine Ratio: 0.16 mg/mg{Cre} — ABNORMAL HIGH (ref 0.00–0.15)
Total Protein, Urine: 41 mg/dL

## 2017-10-29 LAB — WET PREP, GENITAL
CLUE CELLS WET PREP: NONE SEEN
SPERM: NONE SEEN
TRICH WET PREP: NONE SEEN

## 2017-10-29 LAB — AMNISURE RUPTURE OF MEMBRANE (ROM) NOT AT ARMC: AMNISURE: NEGATIVE

## 2017-10-29 IMAGING — US US MFM FETAL BPP W/O NON-STRESS
1 series · 12 of 12 positions shown · non-contrast
Comparison: none

[Series 1: us mfm fetal bpp w/o non-stress · 12 acquisitions, 12 frames shown]
[im 1/12]
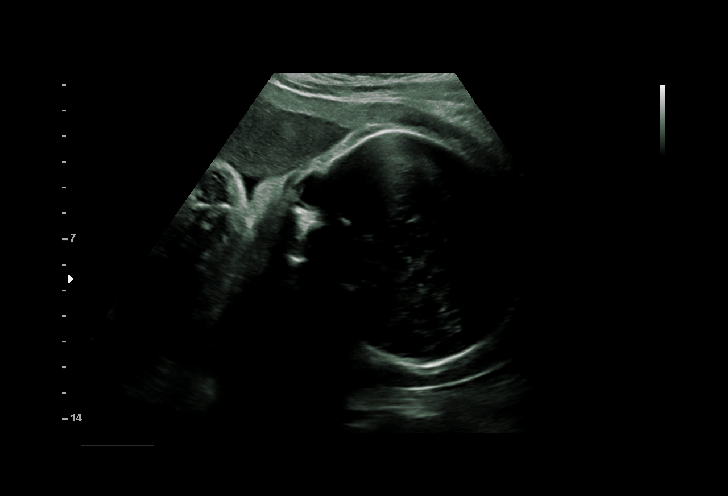
[im 2/12]
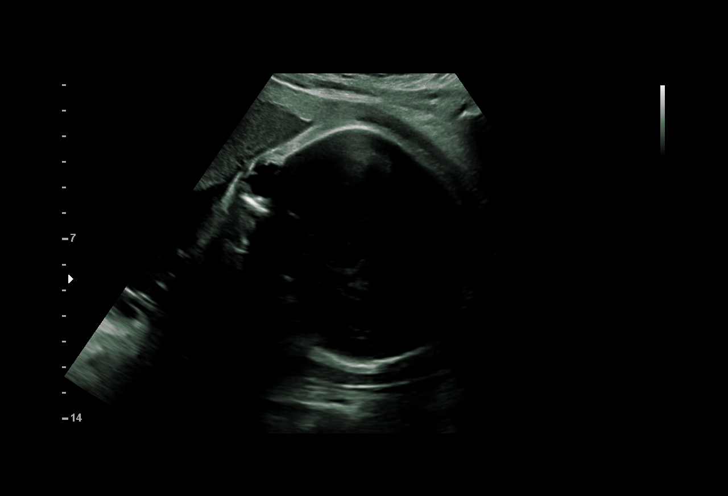
[im 3/12]
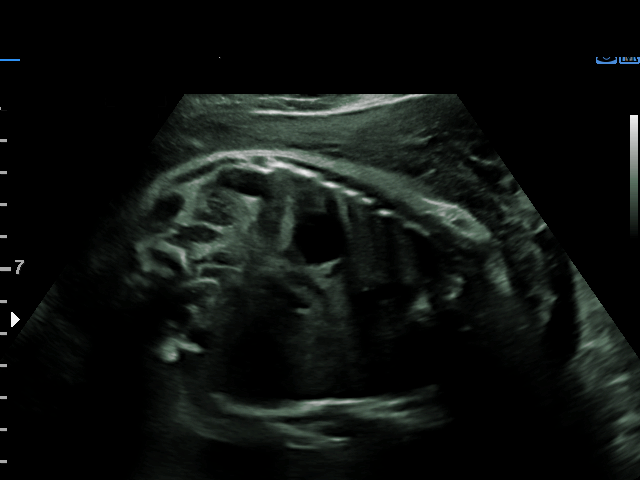
[im 4/12]
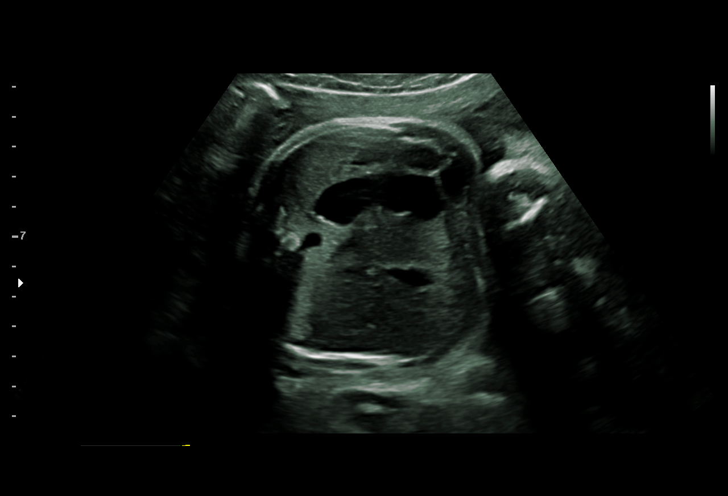
[im 5/12]
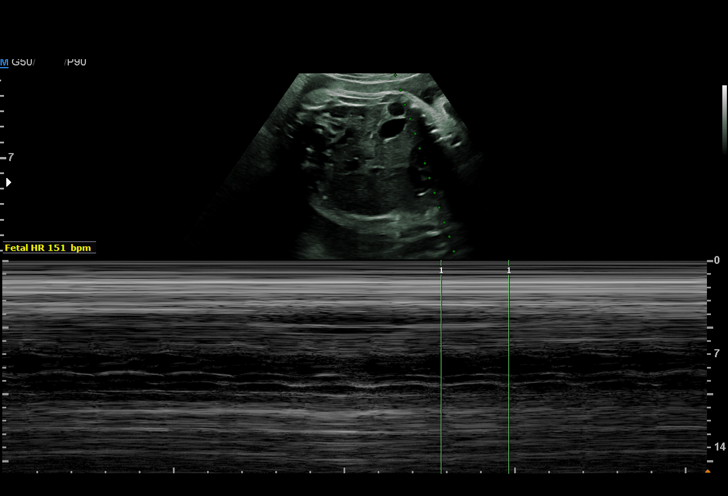
[im 6/12]
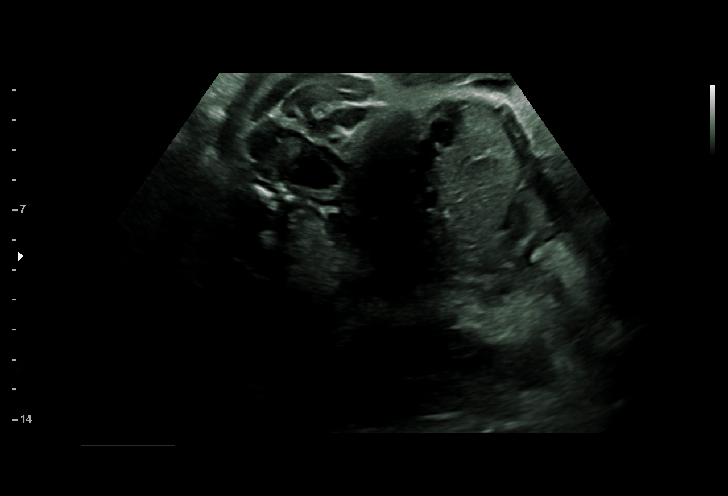
[im 7/12]
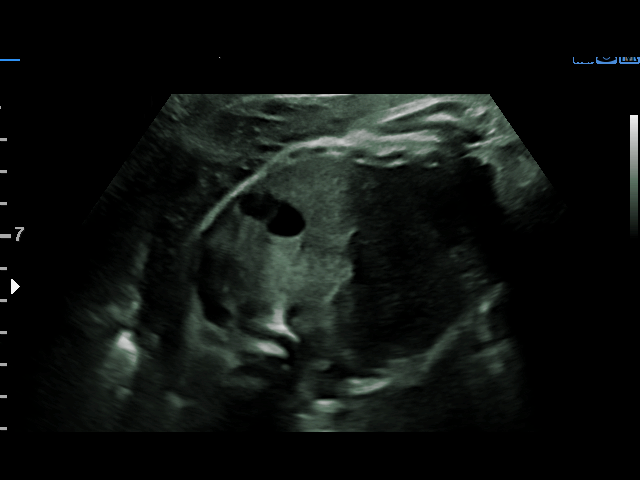
[im 8/12]
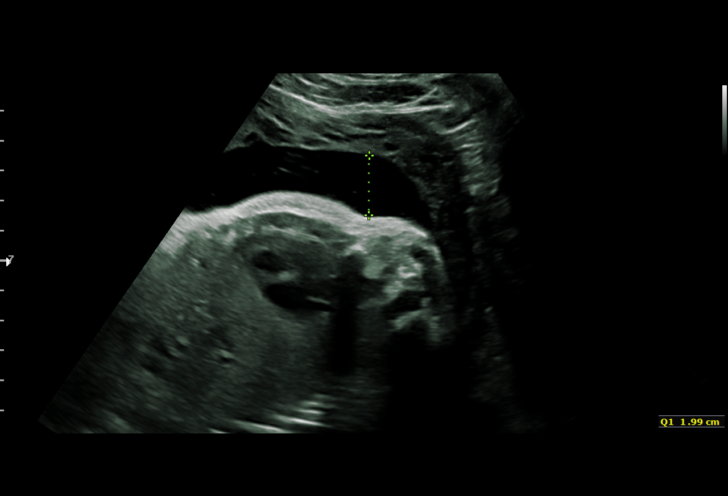
[im 9/12]
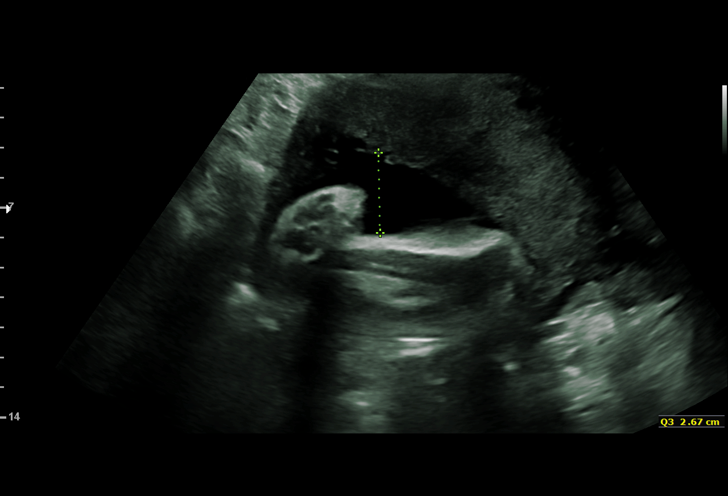
[im 10/12]
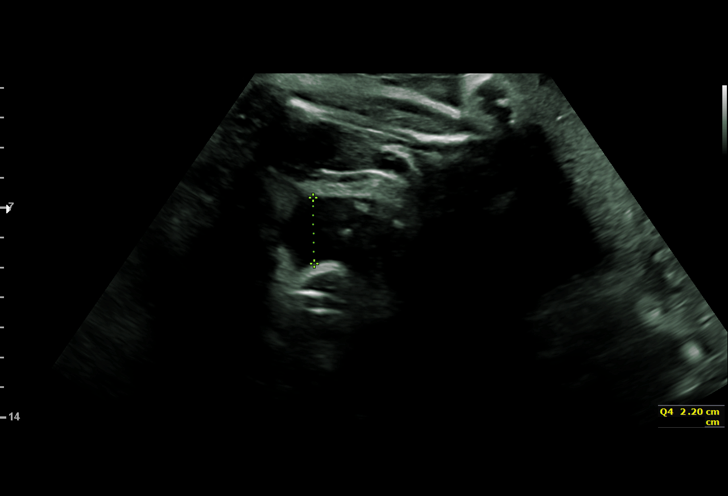
[im 11/12]
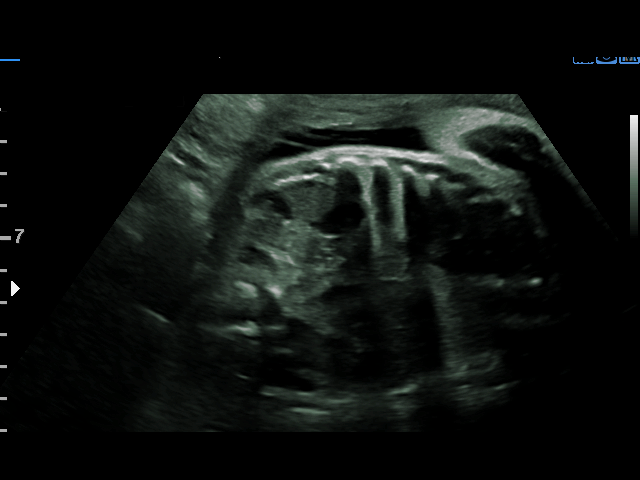
[im 12/12]
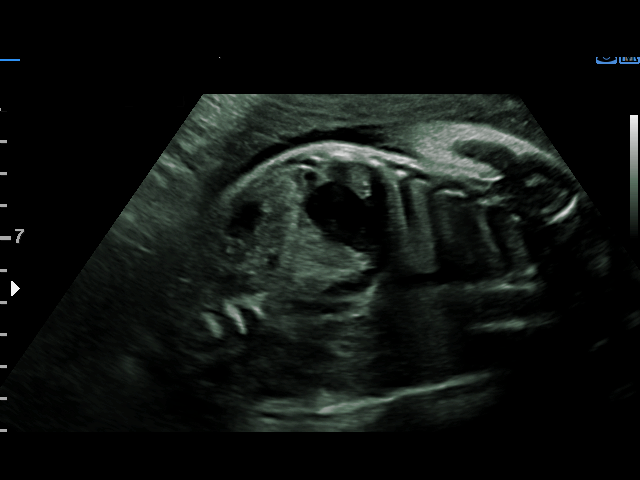

[12 of 12 positions shown; findings below may reference images not displayed]

Attending:        Jaiero Meji        Secondary Phy.:   [facility]-
MAU/Triage
[REDACTED]care - [HOSPITAL]

Indications

32 weeks gestation of pregnancy
Non-reactive NST
Poor obstetric history: Previous fetal growth
restriction (FGR)
Hypertension - Chronic/Pre-existing (ASA)
Obesity complicating pregnancy, third
trimester (pregravid BMI 33)
Advanced maternal age multigravida 39,
third trimester (low risk NIPS)
OB History

Gravidity:    5         Term:   2        Prem:   0        SAB:   2
TOP:          0       Ectopic:  0        Living: 2
Fetal Evaluation

Num Of Fetuses:     1
Fetal Heart         151
Rate(bpm):
Cardiac Activity:   Observed
Presentation:       Cephalic

Amniotic Fluid
AFI FV:      Within normal limits

AFI Sum(cm)     %Tile       Largest Pocket(cm)
9.61            13
RUQ(cm)       RLQ(cm)       LUQ(cm)        LLQ(cm)
1.99
Biophysical Evaluation

Amniotic F.V:   Within normal limits       F. Tone:        Observed
F. Movement:    Observed                   Score:          [DATE]
F. Breathing:   Observed
Gestational Age

LMP:           32w 2d        Date:  03/17/17                 EDD:   12/22/17
Best:          32w 2d     Det. By:  LMP  (03/17/17)          EDD:   12/22/17
Anatomy

Stomach:               Appears normal, left   Bladder:                Appears normal
sided
Cervix Uterus Adnexa

Cervix
Not visualized (advanced GA >60wks)
Impression

Patient was evaluated for abdominal pain.
NST was nonreactive.

Amniotic fluid is normal and good fetal activity is seen.
Antenatal testing is reassuring. BPP [DATE].
Recommendations

Follow-up scans as clinically indicated.

## 2017-10-29 MED ORDER — TERCONAZOLE 0.8 % VA CREA: 1.0000 | TOPICAL_CREAM | Freq: Every day | VAGINAL | 0 refills | Status: DC

## 2017-10-29 NOTE — Discharge Instructions (Signed)
Fetal Movement Counts °Patient Name: ________________________________________________ Patient Due Date: ____________________ °What is a fetal movement count? °A fetal movement count is the number of times that you feel your baby move during a certain amount of time. This may also be called a fetal kick count. A fetal movement count is recommended for every pregnant woman. You may be asked to start counting fetal movements as early as week 28 of your pregnancy. °Pay attention to when your baby is most active. You may notice your baby's sleep and wake cycles. You may also notice things that make your baby move more. You should do a fetal movement count: °· When your baby is normally most active. °· At the same time each day. ° °A good time to count movements is while you are resting, after having something to eat and drink. °How do I count fetal movements? °1. Find a quiet, comfortable area. Sit, or lie down on your side. °2. Write down the date, the start time and stop time, and the number of movements that you felt between those two times. Take this information with you to your health care visits. °3. For 2 hours, count kicks, flutters, swishes, rolls, and jabs. You should feel at least 10 movements during 2 hours. °4. You may stop counting after you have felt 10 movements. °5. If you do not feel 10 movements in 2 hours, have something to eat and drink. Then, keep resting and counting for 1 hour. If you feel at least 4 movements during that hour, you may stop counting. °Contact a health care provider if: °· You feel fewer than 4 movements in 2 hours. °· Your baby is not moving like he or she usually does. °Date: ____________ Start time: ____________ Stop time: ____________ Movements: ____________ °Date: ____________ Start time: ____________ Stop time: ____________ Movements: ____________ °Date: ____________ Start time: ____________ Stop time: ____________ Movements: ____________ °Date: ____________ Start time:  ____________ Stop time: ____________ Movements: ____________ °Date: ____________ Start time: ____________ Stop time: ____________ Movements: ____________ °Date: ____________ Start time: ____________ Stop time: ____________ Movements: ____________ °Date: ____________ Start time: ____________ Stop time: ____________ Movements: ____________ °Date: ____________ Start time: ____________ Stop time: ____________ Movements: ____________ °Date: ____________ Start time: ____________ Stop time: ____________ Movements: ____________ °This information is not intended to replace advice given to you by your health care provider. Make sure you discuss any questions you have with your health care provider. °Document Released: 04/05/2006 Document Revised: 11/03/2015 Document Reviewed: 04/15/2015 °Elsevier Interactive Patient Education © 2018 [employer] ° ° ° °Hypertension During Pregnancy °Hypertension, commonly called high blood pressure, is when the force of blood pumping through your arteries is too strong. Arteries are blood vessels that carry blood from the heart throughout the body. Hypertension during pregnancy can cause problems for you and your baby. Your baby may be born early (prematurely) or may not weigh as much as he or she should at birth. Very bad cases of hypertension during pregnancy can be life-threatening. °Different types of hypertension can occur during pregnancy. These include: °· Chronic hypertension. This happens when: °? You have hypertension before pregnancy and it continues during pregnancy. °? You develop hypertension before you are [redacted] weeks pregnant, and it continues during pregnancy. °· Gestational hypertension. This is hypertension that develops after the 20th week of pregnancy. °· Preeclampsia, also called toxemia of pregnancy. This is a very serious type of hypertension that develops only during pregnancy. It affects the whole body, and it can be very dangerous for you and   your baby. ° °Gestational  hypertension and preeclampsia usually go away within 6 weeks after your baby is born. Women who have hypertension during pregnancy have a greater chance of developing hypertension later in life or during future pregnancies. °What are the causes? °The exact cause of hypertension is not known. °What increases the risk? °There are certain factors that make it more likely for you to develop hypertension during pregnancy. These include: °· Having hypertension during a previous pregnancy or prior to pregnancy. °· Being overweight. °· Being older than age 40. °· Being pregnant for the first time or being pregnant with more than one baby. °· Becoming pregnant using fertilization methods such as IVF (in vitro fertilization). °· Having diabetes, kidney problems, or systemic lupus erythematosus. °· Having a family history of hypertension. ° °What are the signs or symptoms? °Chronic hypertension and gestational hypertension rarely cause symptoms. Preeclampsia causes symptoms, which may include: °· Increased protein in your urine. Your health care provider will check for this at every visit before you give birth (prenatal visit). °· Severe headaches. °· Sudden weight gain. °· Swelling of the hands, face, legs, and feet. °· Nausea and vomiting. °· Vision problems, such as blurred or double vision. °· Numbness in the face, arms, legs, and feet. °· Dizziness. °· Slurred speech. °· Sensitivity to bright lights. °· Abdominal pain. °· Convulsions. ° °How is this diagnosed? °You may be diagnosed with hypertension during a routine prenatal exam. At each prenatal visit, you may: °· Have a urine test to check for high amounts of protein in your urine. °· Have your blood pressure checked. A blood pressure reading is recorded as two numbers, such as "120 over 80" (or 120/80). The first ("top") number is called the systolic pressure. It is a measure of the pressure in your arteries when your heart beats. The second ("bottom") number is  called the diastolic pressure. It is a measure of the pressure in your arteries as your heart relaxes between beats. Blood pressure is measured in a unit called mm Hg. A normal blood pressure reading is: °? Systolic: below 120. °? Diastolic: below 80. ° °The type of hypertension that you are diagnosed with depends on your test results and when your symptoms developed. °· Chronic hypertension is usually diagnosed before 20 weeks of pregnancy. °· Gestational hypertension is usually diagnosed after 20 weeks of pregnancy. °· Hypertension with high amounts of protein in the urine is diagnosed as preeclampsia. °· Blood pressure measurements that stay above 160 systolic, or above 110 diastolic, are signs of severe preeclampsia. ° °How is this treated? °Treatment for hypertension during pregnancy varies depending on the type of hypertension you have and how serious it is. °· If you take medicines called ACE inhibitors to treat chronic hypertension, you may need to switch medicines. ACE inhibitors should not be taken during pregnancy. °· If you have gestational hypertension, you may need to take blood pressure medicine. °· If you are at risk for preeclampsia, your health care provider may recommend that you take a low-dose aspirin every day to prevent high blood pressure during your pregnancy. °· If you have severe preeclampsia, you may need to be hospitalized so you and your baby can be monitored closely. You may also need to take medicine (magnesium sulfate) to prevent seizures and to lower blood pressure. This medicine may be given as an injection or through an IV tube. °· In some cases, if your condition gets worse, you may need to deliver your baby   early. ° °Follow these instructions at home: °Eating and drinking °· Drink enough fluid to keep your urine clear or pale yellow. °· Eat a healthy diet that is low in salt (sodium). Do not add salt to your food. Check food labels to see how much sodium a food or beverage  contains. °Lifestyle °· Do not use any products that contain nicotine or tobacco, such as cigarettes and e-cigarettes. If you need help quitting, ask your health care provider. °· Do not use alcohol. °· Avoid caffeine. °· Avoid stress as much as possible. Rest and get plenty of sleep. °General instructions °· Take over-the-counter and prescription medicines only as told by your health care provider. °· While lying down, lie on your left side. This keeps pressure off your baby. °· While sitting or lying down, raise (elevate) your feet. Try putting some pillows under your lower legs. °· Exercise regularly. Ask your health care provider what kinds of exercise are best for you. °· Keep all prenatal and follow-up visits as told by your health care provider. This is important. °Contact a health care provider if: °· You have symptoms that your health care provider told you may require more treatment or monitoring, such as: °? Fever. °? Vomiting. °? Headache. °Get help right away if: °· You have severe abdominal pain or vomiting that does not get better with treatment. °· You suddenly develop swelling in your hands, ankles, or face. °· You gain 4 lbs (1.8 kg) or more in 1 week. °· You develop vaginal bleeding, or you have blood in your urine. °· You do not feel your baby moving as much as usual. °· You have blurred or double vision. °· You have muscle twitching or sudden tightening (spasms). °· You have shortness of breath. °· Your lips or fingernails turn blue. °This information is not intended to replace advice given to you by your health care provider. Make sure you discuss any questions you have with your health care provider. °Document Released: 11/22/2010 Document Revised: 09/24/2015 Document Reviewed: 08/20/2015 °Elsevier Interactive Patient Education © 2018 [employer]vier Inc. ° °

## 2017-10-29 NOTE — MAU Note (Signed)
Pt reports pain of pelvic pressure. States the pressure began at 0030. Rates 8/10. States she is having leaking/discharge. Denies bleeding. +FM.

## 2017-10-29 NOTE — MAU Provider Note (Signed)
Chief Complaint:  Pelvic Pain   First Provider Initiated Contact with Patient 10/29/17 0351     HPI: Tracey [name] is a 42 y.o. [id number] at 50w2dwho presents to maternity admissions reporting pelvic pressure & vaginal discharge. Symptoms began around 1230 this morning. Reports intermittent pelvic pressure and abdominal tightening that occurs around every 15 minutes. States feels like baby is pressing down into her pelvis. Also reports leaking vs vaginal discharge. States the leaking is somewhat watery in nature. No odor or vaginal irritation. Denies vaginal bleeding. Positive fetal movement.  CHTN, no meds, just baby ASA. Denies h/a, epigastric pain, or visual disturbance.   Location: abdomen/pelvis Quality: pressure Severity: 8/10 in pain scale Duration: 3 hrs Timing: intermittent Modifying factors: none Associated signs and symptoms: vaginal discharge  Pregnancy Course:   Past Medical History:  Diagnosis Date  . Anxiety   . Bronchitis   . Carpal tunnel syndrome on both sides   . Depression   . Family history of adverse reaction to anesthesia    mother had problems waking up from surgery.  . Headache    migraines  . Hypertension   . Panic attack 2017   diagnosed 3 months ago. no meds presently  . Reflux    did not filll prescription   OB History  Gravida Para Term Preterm AB Living  _0 0 2 2  SAB TAB Ectopic Multiple Live Births  2 0 0 0 2    # Outcome Date GA Lbr Len/2nd Weight Sex Delivery Anes PTL Lv  5 Current           4 SAB 08/09/15 170w0d       3 Term 03/23/05 [id number] g F Vag-Spont   LIV     Complications: IUGR (intrauterine growth restriction) affecting care of mother  2 SAB 2004          1 Term 08/06/96   3175 g M Vag-Spont   LIV   Past Surgical History:  Procedure Laterality Date  . BREAST BIOPSY  right    cyst  . DILATION AND EVACUATION N/A 07/26/2015   Procedure: DILATATION AND EVACUATION;  Surgeon: ChaShelly BombardD;  Location: [facility];   Service: Gynecology;  Laterality: N/A;   Family History  Problem Relation Age of Onset  . Hypertension Mother   . Diabetes Maternal Aunt   . Diabetes Maternal Uncle    Social History   Tobacco Use  . Smoking status: Current Some Day Smoker    Packs/day: 0.25    Types: Cigarettes  . Smokeless tobacco: Never Used  . Tobacco comment: 2 cig/day  Substance Use Topics  . Alcohol use: No    Alcohol/week: 0.0 standard drinks  . Drug use: No   No Known Allergies No medications prior to admission.    I have reviewed patient's Past Medical Hx, Surgical Hx, Family Hx, Social Hx, medications and allergies.   ROS:  Review of Systems  Constitutional: Negative.   Eyes: Negative for visual disturbance.  Gastrointestinal: Positive for abdominal pain. Negative for nausea and vomiting.  Genitourinary: Positive for pelvic pain and vaginal discharge. Negative for dysuria and vaginal bleeding.  Neurological: Positive for headaches.    Physical Exam   Patient Vitals for the past 24 hrs:  BP Temp Temp src Pulse Resp SpO2 Height Weight  10/29/17 0500 139/68 - - 80 18 - - -  10/29/17 0401 138/65 - - 89 19 - - -  10/29/17  0346 (!) 146/82 - - 90 19 - - -  10/29/17 0334 137/85 - - 96 18 - - -  10/29/17 0303 (!) 146/78 98.5 F (36.9 C) Oral 87 18 100 % _0  (1.676 m) 94.3 kg    Constitutional: Well-developed, well-nourished female in no acute distress.  Cardiovascular: normal rate & rhythm, no murmur Respiratory: normal effort, lung sounds clear throughout GI: Abd soft, non-tender, gravid appropriate for gestational age. Pos BS x 4 MS: Extremities nontender, no edema, normal ROM Neurologic: Alert and oriented x 4.  GU:      Pelvic: yellow clumpy discharge  Dilation: Closed Exam by:: Almond Lint, NP  NST:  Baseline: 140 bpm, Variability: Good {> 6 bpm), Accelerations: 10x10 and Decelerations: Absent   Labs: Results for orders placed or performed during the hospital encounter of  10/29/17 (from the past 24 hour(s))  Urinalysis, Routine w reflex microscopic     Status: Abnormal   Collection Time: 10/29/17  3:21 AM  Result Value Ref Range   Color, Urine YELLOW YELLOW   APPearance HAZY (A) CLEAR   Specific Gravity, Urine 1.025 1.005 - 1.030   pH 6.0 5.0 - 8.0   Glucose, UA NEGATIVE NEGATIVE mg/dL   Hgb urine dipstick NEGATIVE NEGATIVE   Bilirubin Urine NEGATIVE NEGATIVE   Ketones, ur 80 (A) NEGATIVE mg/dL   Protein, ur 30 (A) NEGATIVE mg/dL   Nitrite NEGATIVE NEGATIVE   Leukocytes, UA MODERATE (A) NEGATIVE   RBC / HPF 0-5 0 - 5 RBC/hpf   WBC, UA 6-10 0 - 5 WBC/hpf   Bacteria, UA RARE (A) NONE SEEN   Squamous Epithelial / LPF 6-10 0 - 5   Mucus PRESENT   Protein / creatinine ratio, urine     Status: Abnormal   Collection Time: 10/29/17  3:21 AM  Result Value Ref Range   Creatinine, Urine 252.00 mg/dL   Total Protein, Urine 41 mg/dL   Protein Creatinine Ratio 0.16 (H) 0.00 - 0.15 mg/mg[Cre]  CBC     Status: Abnormal   Collection Time: 10/29/17  3:29 AM  Result Value Ref Range   WBC 10.2 4.0 - 10.5 K/uL   RBC 3.70 (L) 3.87 - 5.11 MIL/uL   Hemoglobin 11.0 (L) 12.0 - 15.0 g/dL   HCT 32.0 (L) 36.0 - 46.0 %   MCV 86.5 78.0 - 100.0 fL   MCH 29.7 26.0 - 34.0 pg   MCHC 34.4 30.0 - 36.0 g/dL   RDW 14.1 11.5 - 15.5 %   Platelets 283 150 - 400 K/uL  Comprehensive metabolic panel     Status: Abnormal   Collection Time: 10/29/17  3:29 AM  Result Value Ref Range   Sodium 131 (L) 135 - 145 mmol/L   Potassium 3.6 3.5 - 5.1 mmol/L   Chloride 104 98 - 111 mmol/L   CO2 17 (L) 22 - 32 mmol/L   Glucose, Bld 99 70 - 99 mg/dL   BUN 10 6 - 20 mg/dL   Creatinine, Ser 0.71 0.44 - 1.00 mg/dL   Calcium 8.8 (L) 8.9 - 10.3 mg/dL   Total Protein 6.3 (L) 6.5 - 8.1 g/dL   Albumin 3.1 (L) 3.5 - 5.0 g/dL   AST 16 15 - 41 U/L   ALT 16 0 - 44 U/L   Alkaline Phosphatase 121 38 - 126 U/L   Total Bilirubin 0.6 0.3 - 1.2 mg/dL   GFR calc non Af Amer >60 >60 mL/min   GFR calc Af  Amer >60 >  60 mL/min   Anion gap 10 5 - 15  Amnisure rupture of membrane (rom)not at [facility]     Status: None   Collection Time: 10/29/17  3:31 AM  Result Value Ref Range   Amnisure ROM NEGATIVE   Wet prep, genital     Status: Abnormal   Collection Time: 10/29/17  3:58 AM  Result Value Ref Range   Yeast Wet Prep HPF POC PRESENT (A) NONE SEEN   Trich, Wet Prep NONE SEEN NONE SEEN   Clue Cells Wet Prep HPF POC NONE SEEN NONE SEEN   WBC, Wet Prep HPF POC FEW (A) NONE SEEN   Sperm NONE SEEN     Imaging:  [place] Mfm Fetal Bpp Wo Non Stress  Result Date: 10/29/2017 ----------------------------------------------------------------------  OBSTETRICS REPORT                      (Signed Final 10/29/2017 08:22 am) ---------------------------------------------------------------------- Patient Info  ID #:       [id number]                          D.O.B.:  February 09, 1976 (41 yrs)  Name:       XVQMG Thong                    Visit Date: 10/29/2017 04:26 am ---------------------------------------------------------------------- Performed By  Performed By:     Felecia Jan        Ref. Address:     [address] [place]                                                             [place]  Attending:        Tama High MD        Secondary Phy.:   MAU Nursing-  MAU/Triage  [facility]erred By:      [facility]             Location:         [place] ---------------------------------------------------------------------- Orders   #  Description                                 Code   1  [place] MFM FETAL BPP WO NON STRESS              [phone]   ----------------------------------------------------------------------   #  Ordered By               Order #        Accession #    Episode #   1  Jorje Guild            [id number]      [id number]     [id number]  ---------------------------------------------------------------------- Indications   [redacted] weeks gestation of pregnancy                Z3A.32   Non-reactive NST                               O28.9   Poor obstetric history: Previous fetal growth  O09.299   restriction (FGR)   Hypertension - Chronic/Pre-existing (ASA)      A57.903   Obesity complicating pregnancy, third          O99.213   trimester (pregravid BMI 67)   Advanced maternal age multigravida 61,         O53.523   third trimester (low risk NIPS)  ---------------------------------------------------------------------- OB History  Gravidity:    5         Term:   2        Prem:   0        SAB:   2  TOP:          0       Ectopic:  0        Living: 2 ---------------------------------------------------------------------- Fetal Evaluation  Num Of Fetuses:     1  Fetal Heart         151  Rate(bpm):  Cardiac Activity:   Observed  Presentation:       Cephalic  Amniotic Fluid  AFI FV:      Within normal limits  AFI Sum(cm)     %Tile       Largest Pocket(cm)  9.61            13          2.75  RUQ(cm)       RLQ(cm)       LUQ(cm)        LLQ(cm)  1.99          2.2           2.75           2.67 ---------------------------------------------------------------------- Biophysical Evaluation  Amniotic F.V:   Within normal limits       F. Tone:        Observed  F. Movement:  Observed                   Score:          8/8  F. Breathing:   Observed ---------------------------------------------------------------------- Gestational Age  LMP:           32w 2d        Date:  03/17/17                 EDD:   12/22/17  Best:          Milderd Meager 2d     Det. By:  LMP  (03/17/17)          EDD:   12/22/17 ---------------------------------------------------------------------- Anatomy  Stomach:                Appears normal, left   Bladder:                Appears normal                         sided ---------------------------------------------------------------------- Cervix Uterus Adnexa  Cervix  Not visualized (advanced GA >24wks) ---------------------------------------------------------------------- Impression  Patient was evaluated for abdominal pain.  NST was nonreactive.  Amniotic fluid is normal and good fetal activity is seen.  Antenatal testing is reassuring. BPP 8/8. ---------------------------------------------------------------------- Recommendations  Follow-up scans as clinically indicated. ----------------------------------------------------------------------                  Tama High, MD Electronically Signed Final Report   10/29/2017 08:22 am ----------------------------------------------------------------------   MAU Course: Orders Placed This Encounter  Procedures  . Wet prep, genital  . Culture, OB Urine  . [place] MFM FETAL BPP WO NON STRESS  . Urinalysis, Routine w reflex microscopic  . CBC  . Comprehensive metabolic panel  . Protein / creatinine ratio, urine  . Amnisure rupture of membrane (rom)not at [facility]  . Discharge patient   Meds ordered this encounter  Medications  . terconazole (TERAZOL 3) 0.8 % vaginal cream    Sig: Place 1 applicator vaginally at bedtime.    Dispense:  20 g    Refill:  0    Order Specific Question:   Supervising Provider    Answer:   Marjory Lies    MDM: Fetal tracing category 1 but not reactive. BPP ordered & 8/8 No ferning & amnisure negative. Wet prep & exam consistent with yeast infection -- will rx terazol Bps above pt's baseline x 2, none severe range & pt asymptomatic. Negative PEC labs. Pt has f/u with Dr. Harolyn Rutherford on Tuesday.  Cervix closed/thick, no regular ctx on monitor. Pt reassured.   Assessment: 1. Vaginal yeast infection   2. Non-reactive NST (non-stress test)   3. [redacted] weeks gestation of pregnancy   4.  Encounter for suspected PROM, with rupture of membranes not found     Plan: Discharge home in stable condition.  Preterm Labor & HTN precautions and fetal kick counts   Allergies as of 10/29/2017   No Known Allergies     Medication List    TAKE these medications   ACCU-CHEK FASTCLIX LANCETS Misc 1 Units by Percutaneous route 4 (four) times daily.   ACCU-CHEK NANO SMARTVIEW w/Device Kit 1 kit by Subdermal route as directed. Check blood sugars for fasting, and two hours after breakfast, lunch and dinner (4 checks daily)   aspirin 81 MG chewable tablet Chew 1 tablet (81 mg total) by mouth daily.   COMFORT  FIT MATERNITY SUPP SM Misc Wear as directed.   glucose blood test strip Use as instructed to check blood sugars   glucose blood test strip Use as instructed   metFORMIN 500 MG tablet Commonly known as:  GLUCOPHAGE Take 1 tablet (500 mg total) by mouth 2 (two) times daily with a meal.   pantoprazole 40 MG tablet Commonly known as:  PROTONIX Take 1 tablet (40 mg total) by mouth daily.   PROBIOTIC DAILY PO Take by mouth.   terconazole 0.8 % vaginal cream Commonly known as:  TERAZOL 3 Place 1 applicator vaginally at bedtime.   VITAFOL-ONE 29-1-200 MG Caps Take 1 capsule by mouth daily before breakfast.   Vitamin D (Ergocalciferol) 50000 units Caps capsule Commonly known as:  DRISDOL Take 1 capsule (50,000 Units total) by mouth every 7 (seven) days.       Jorje Guild, NP 10/29/2017 9:59 AM

## 2017-10-30 ENCOUNTER — Ambulatory Visit (INDEPENDENT_AMBULATORY_CARE_PROVIDER_SITE_OTHER): Payer: 59 | Admitting: Obstetrics and Gynecology

## 2017-10-30 ENCOUNTER — Encounter: Payer: Self-pay | Admitting: Obstetrics and Gynecology

## 2017-10-30 VITALS — BP 138/80 | HR 96 | Wt 207.0 lb

## 2017-10-30 DIAGNOSIS — O10913 Unspecified pre-existing hypertension complicating pregnancy, third trimester: Secondary | ICD-10-CM

## 2017-10-30 DIAGNOSIS — O09523 Supervision of elderly multigravida, third trimester: Secondary | ICD-10-CM

## 2017-10-30 DIAGNOSIS — O0990 Supervision of high risk pregnancy, unspecified, unspecified trimester: Secondary | ICD-10-CM

## 2017-10-30 DIAGNOSIS — O0993 Supervision of high risk pregnancy, unspecified, third trimester: Secondary | ICD-10-CM

## 2017-10-30 DIAGNOSIS — O10919 Unspecified pre-existing hypertension complicating pregnancy, unspecified trimester: Secondary | ICD-10-CM

## 2017-10-30 DIAGNOSIS — O24913 Unspecified diabetes mellitus in pregnancy, third trimester: Secondary | ICD-10-CM | POA: Diagnosis not present

## 2017-10-30 LAB — CULTURE, OB URINE

## 2017-10-30 NOTE — Progress Notes (Signed)
   PRENATAL VISIT NOTE  Subjective:  Tracey BiddingCindy Chaviano is a 42 y.o. [id number] at 3085w3d being seen today for ongoing prenatal care.  She is currently monitored for the following issues for this high-risk pregnancy and has Chronic hypertension during pregnancy, antepartum; Anxiety, generalized; Supervision of high risk pregnancy, antepartum; History of prior pregnancy with IUGR newborn; Vitamin D deficiency; AMA (advanced maternal age) multigravida 35+; Carpal tunnel syndrome during pregnancy; Heartburn during pregnancy, antepartum; and Diabetes mellitus affecting pregnancy in third trimester on their problem list.  Patient reports no complaints.  Contractions: Irregular. Vag. Bleeding: None.  Movement: Present. Denies leaking of fluid.   The following portions of the patient's history were reviewed and updated as appropriate: allergies, current medications, past family history, past medical history, past social history, past surgical history and problem list. Problem list updated.  Objective:   Vitals:   10/30/17 1013  BP: 138/80  Pulse: 96  Weight: 207 lb (93.9 kg)    Fetal Status: Fetal Heart Rate (bpm): NST Fundal Height: 32 cm Movement: Present     General:  Alert, oriented and cooperative. Patient is in no acute distress.  Skin: Skin is warm and dry. No rash noted.   Cardiovascular: Normal heart rate noted  Respiratory: Normal respiratory effort, no problems with respiration noted  Abdomen: Soft, gravid, appropriate for gestational age.  Pain/Pressure: Present     Pelvic: Cervical exam deferred        Extremities: Normal range of motion.     Mental Status: Normal mood and affect. Normal behavior. Normal judgment and thought content.   Assessment and Plan:  Pregnancy: [id number] at 1685w3d  1. Supervision of high risk pregnancy, antepartum Patient is doing well without complaints  2. Diabetes mellitus affecting pregnancy in third trimester CBGs reviewed and within range on  metformin Growth ultrasound on 8/30 Continue antenatal testing  3. Multigravida of advanced maternal age in third trimester Low risks NIPS  4. Chronic hypertension during pregnancy, antepartum No meds Continue ASA Normal labs in MAU yesterday BPP 8/8 on 8/12 NST reviewed and non reactive with baseline 150, mod variability, no decels, pos 10x 10 accels  Preterm labor symptoms and general obstetric precautions including but not limited to vaginal bleeding, contractions, leaking of fluid and fetal movement were reviewed in detail with the patient. Please refer to After Visit Summary for other counseling recommendations.  Return in about 1 week (around 11/06/2017) for ROB, NST.  Future Appointments  Date Time Provider Department Center  11/02/2017  3:30 PM [facility] US 1 [facility]  11/16/2017 10:30 AM [facility] US 5 [facility]    Catalina AntiguaPeggy Leandro Berkowitz, MD

## 2017-11-01 ENCOUNTER — Telehealth: Payer: Self-pay

## 2017-11-01 ENCOUNTER — Other Ambulatory Visit: Payer: Self-pay

## 2017-11-01 NOTE — Telephone Encounter (Signed)
Please advise in PNV I tried to send not sure which to send and directions Pt needs PNV the is covered by Ins Per pt [facility] [address]

## 2017-11-01 NOTE — Telephone Encounter (Signed)
Patient request Change in PNV's  Pt wants vitamins that will be covered by her INS. Pt noted Prenatal Plus w/ Fe be sent to [facility] on [address].

## 2017-11-02 ENCOUNTER — Other Ambulatory Visit (HOSPITAL_COMMUNITY): Payer: 59

## 2017-11-02 ENCOUNTER — Other Ambulatory Visit: Payer: Self-pay | Admitting: Obstetrics

## 2017-11-02 DIAGNOSIS — Z0000 Encounter for general adult medical examination without abnormal findings: Secondary | ICD-10-CM

## 2017-11-02 MED ORDER — PRENATAL PLUS 27-1 MG PO TABS: 1.0000 | ORAL_TABLET | Freq: Every day | ORAL | 11 refills | Status: DC

## 2017-11-02 NOTE — Telephone Encounter (Signed)
Prenatal Plus PNV's Rx

## 2017-11-06 ENCOUNTER — Ambulatory Visit (HOSPITAL_BASED_OUTPATIENT_CLINIC_OR_DEPARTMENT_OTHER)
Admission: RE | Admit: 2017-11-06 | Discharge: 2017-11-06 | Disposition: A | Discharge status: Home / Self Care | Payer: 59 | Source: Ambulatory Visit | Attending: Obstetrics and Gynecology | Admitting: Obstetrics and Gynecology

## 2017-11-06 ENCOUNTER — Other Ambulatory Visit: Payer: Self-pay | Admitting: Obstetrics and Gynecology

## 2017-11-06 ENCOUNTER — Encounter (HOSPITAL_COMMUNITY): Payer: Self-pay

## 2017-11-06 DIAGNOSIS — O09523 Supervision of elderly multigravida, third trimester: Secondary | ICD-10-CM | POA: Insufficient documentation

## 2017-11-06 DIAGNOSIS — O10919 Unspecified pre-existing hypertension complicating pregnancy, unspecified trimester: Secondary | ICD-10-CM

## 2017-11-06 DIAGNOSIS — O24415 Gestational diabetes mellitus in pregnancy, controlled by oral hypoglycemic drugs: Secondary | ICD-10-CM

## 2017-11-06 DIAGNOSIS — E669 Obesity, unspecified: Secondary | ICD-10-CM | POA: Insufficient documentation

## 2017-11-06 DIAGNOSIS — O4103X0 Oligohydramnios, third trimester, not applicable or unspecified: Secondary | ICD-10-CM | POA: Diagnosis not present

## 2017-11-06 DIAGNOSIS — O09293 Supervision of pregnancy with other poor reproductive or obstetric history, third trimester: Secondary | ICD-10-CM

## 2017-11-06 DIAGNOSIS — O10913 Unspecified pre-existing hypertension complicating pregnancy, third trimester: Secondary | ICD-10-CM

## 2017-11-06 DIAGNOSIS — Z3A33 33 weeks gestation of pregnancy: Secondary | ICD-10-CM

## 2017-11-06 DIAGNOSIS — O24913 Unspecified diabetes mellitus in pregnancy, third trimester: Secondary | ICD-10-CM

## 2017-11-06 DIAGNOSIS — O0990 Supervision of high risk pregnancy, unspecified, unspecified trimester: Secondary | ICD-10-CM

## 2017-11-06 DIAGNOSIS — O99213 Obesity complicating pregnancy, third trimester: Secondary | ICD-10-CM | POA: Insufficient documentation

## 2017-11-06 IMAGING — US US MFM FETAL BPP W/O NON-STRESS
1 series · 12 of 13 positions shown · non-contrast
Comparison: none

[Series 1: us mfm fetal bpp w/o non-stress · 13 acquisitions, 12 frames shown]
[im 1/13]
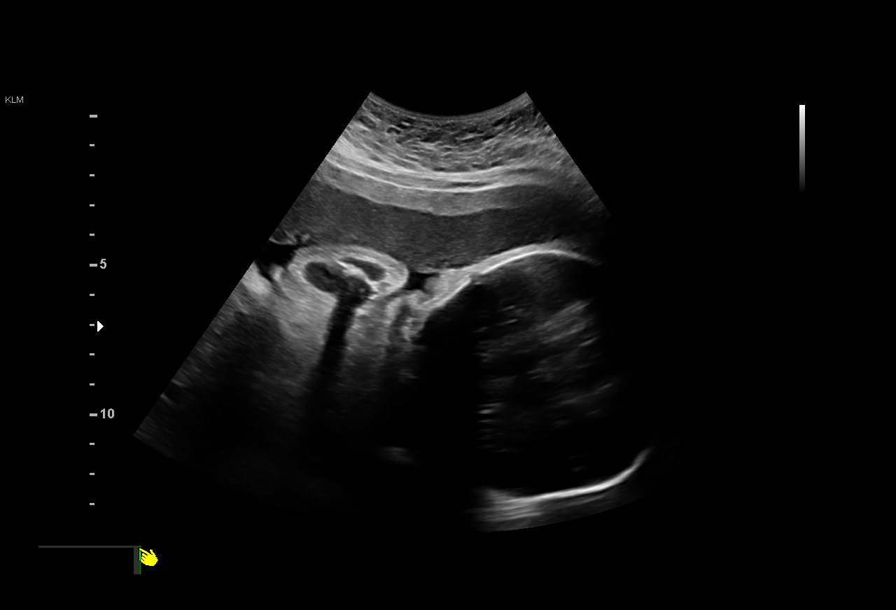
[im 2/13]
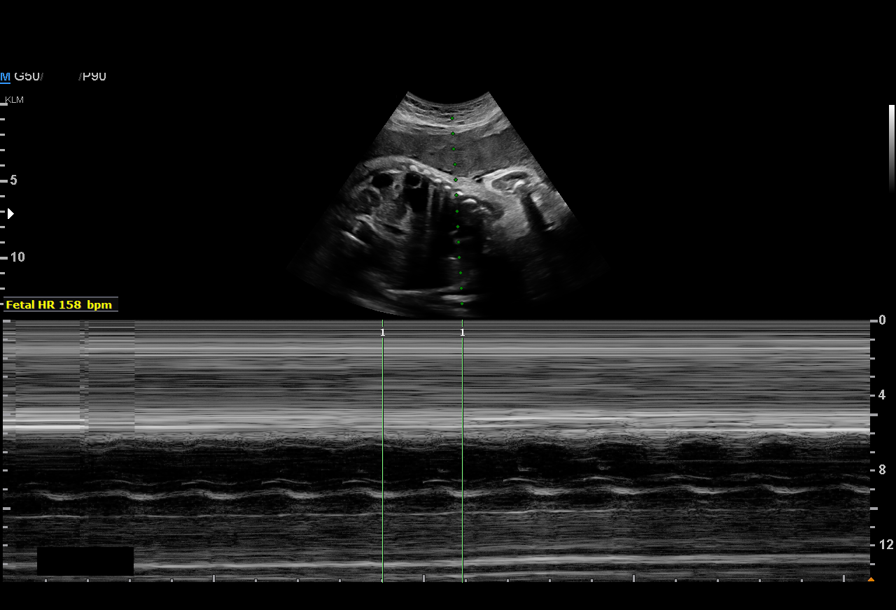
[im 3/13]
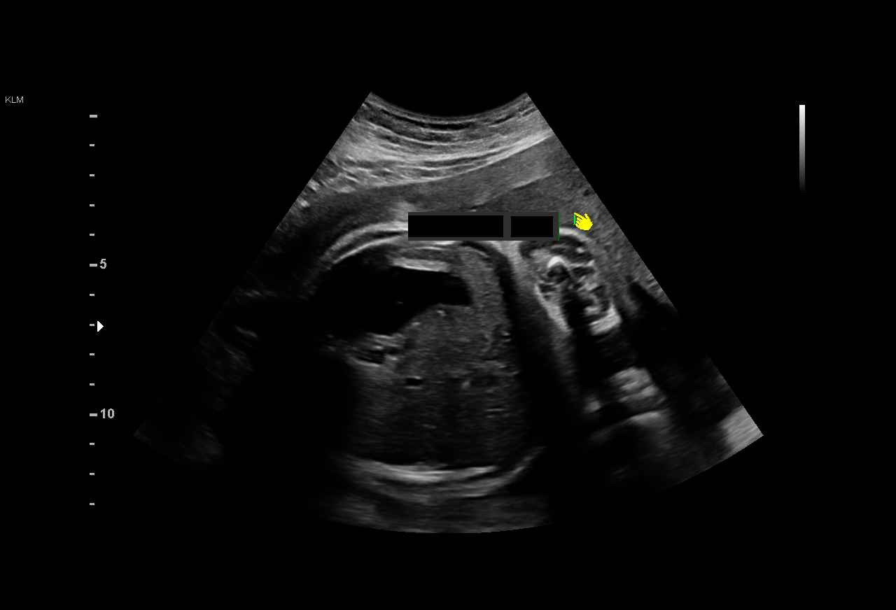
[im 4/13]
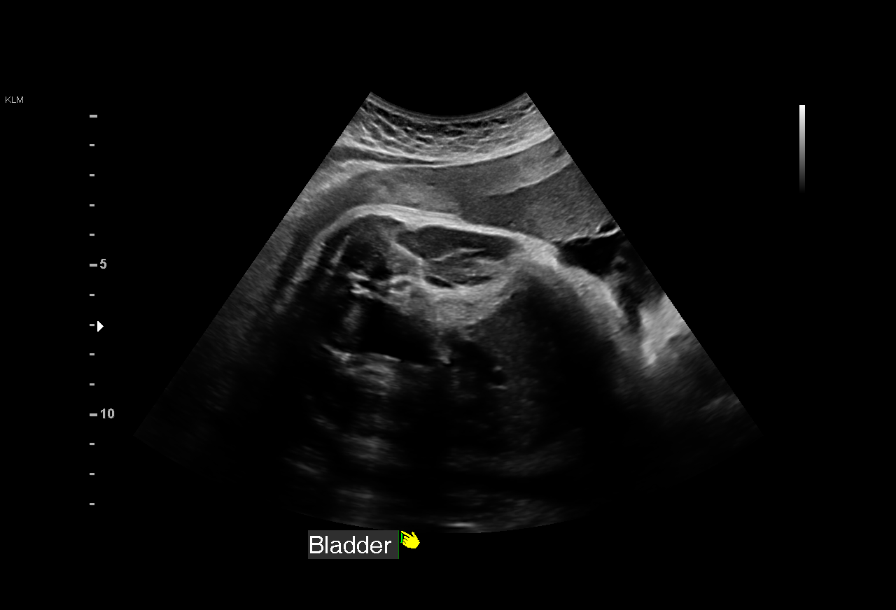
[im 5/13]
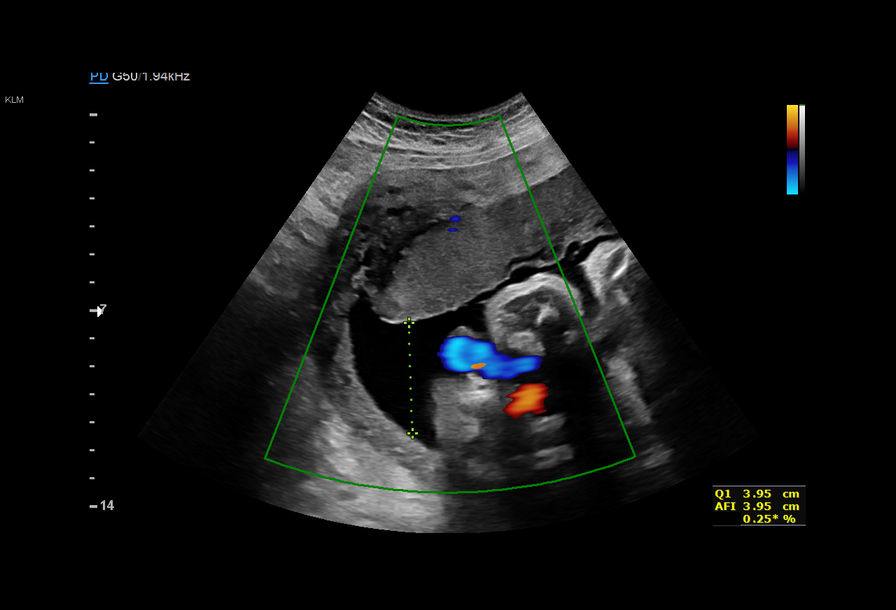
[im 6/13]
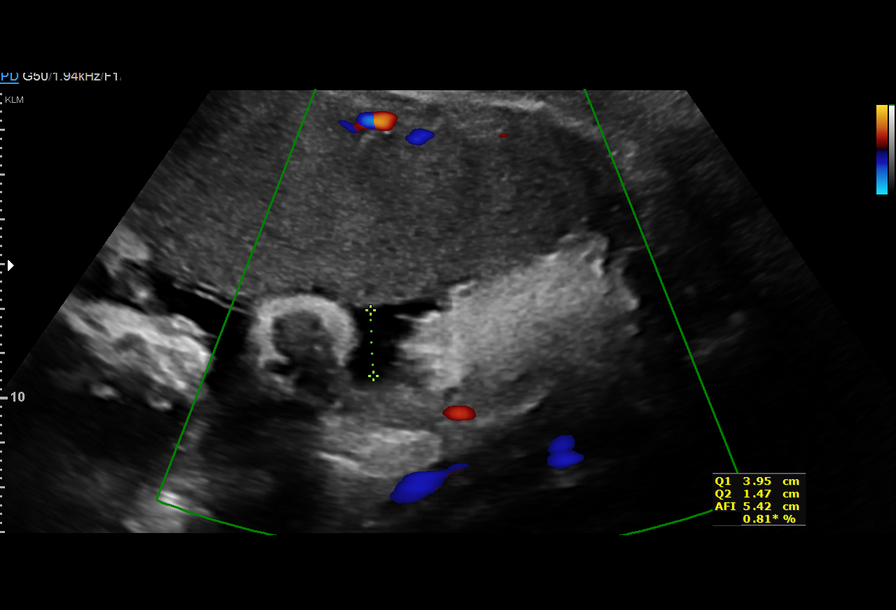
[im 8/13]
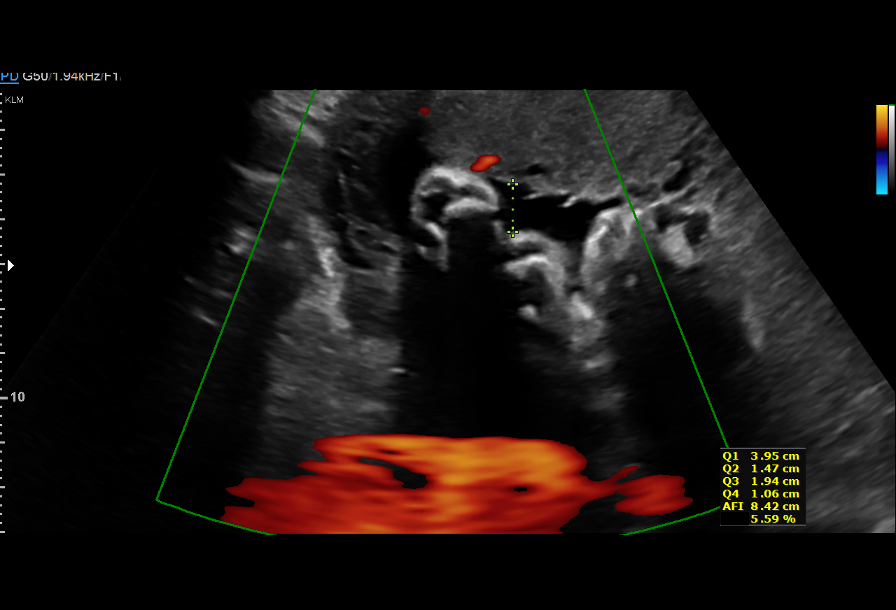
[im 9/13]
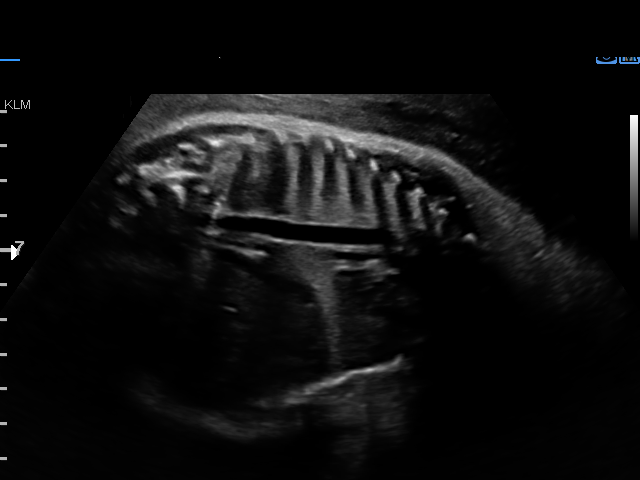
[im 10/13]
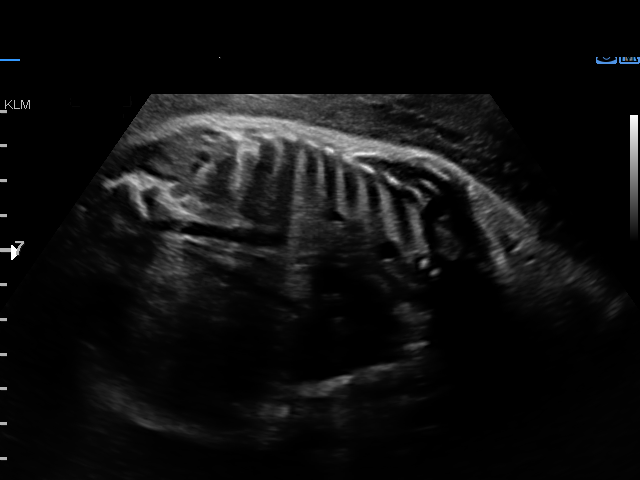
[im 11/13]
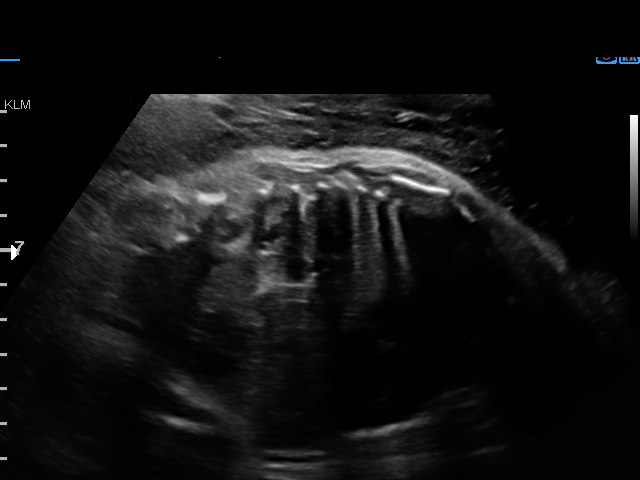
[im 12/13]
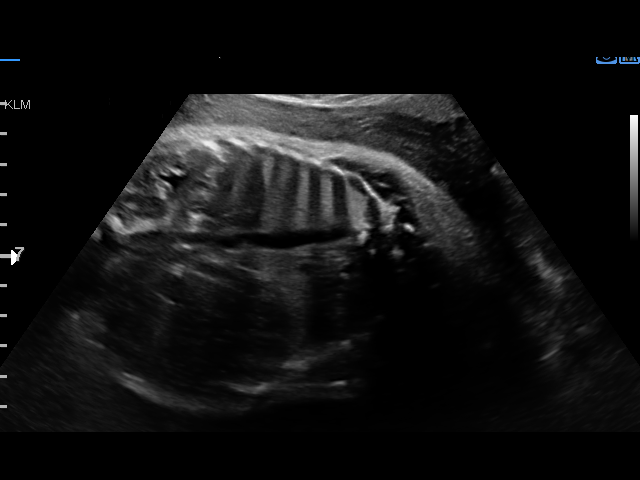
[im 13/13]
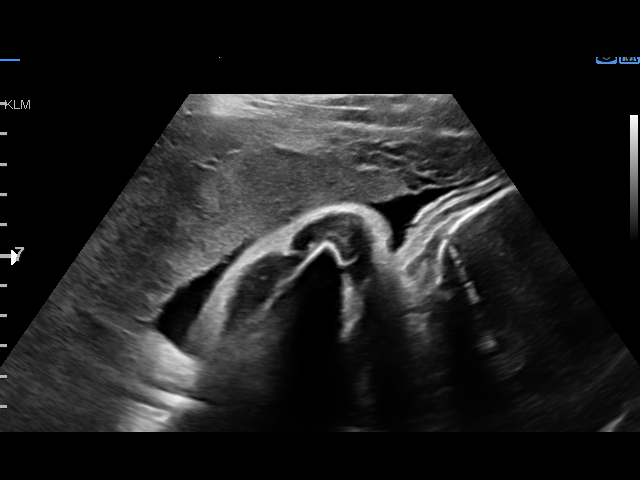

[12 of 13 positions shown; findings below may reference images not displayed]

[REDACTED]care - [HOSPITAL]

Indications

33 weeks gestation of pregnancy
Poor obstetric history: Previous fetal growth
restriction (FGR)
Hypertension - Chronic/Pre-existing (ASA)
Obesity complicating pregnancy, third
trimester (pregravid BMI 33)
Advanced maternal age multigravida 39,
third trimester (low risk NIPS)
Fetal Evaluation

Num Of Fetuses:         1
Fetal Heart Rate(bpm):  158
Cardiac Activity:       Observed
Presentation:           Cephalic

Amniotic Fluid
AFI FV:      Within normal limits

AFI Sum(cm)     %Tile       Largest Pocket(cm)
8.42            7

RUQ(cm)       RLQ(cm)       LUQ(cm)        LLQ(cm)
3.95

Comment:    [DATE] BPP in 9 minutes.
Biophysical Evaluation
Amniotic F.V:   Within normal limits       F. Tone:        Observed
F. Movement:    Observed                   Score:          [DATE]
F. Breathing:   Observed
OB History

Gravidity:    5         Term:   2        Prem:   0        SAB:   2
TOP:          0       Ectopic:  0        Living: 2
Gestational Age

LMP:           33w 3d        Date:  03/17/17                 EDD:   12/22/17
Best:          33w 3d     Det. By:  LMP  (03/17/17)          EDD:   12/22/17
Comments

U/S images reviewed.   No evidence of fetal compromise is
found on today's BPP.
Recommendations: 1) Weekly BPP 2) Serial U/S every 4
weeks for fetal growth
Recommendations

1) Weekly BPP 2) Serial U/S every 4 weeks for fetal growth

## 2017-11-07 ENCOUNTER — Other Ambulatory Visit (HOSPITAL_COMMUNITY): Payer: Self-pay | Admitting: *Deleted

## 2017-11-07 ENCOUNTER — Ambulatory Visit (INDEPENDENT_AMBULATORY_CARE_PROVIDER_SITE_OTHER): Payer: 59 | Admitting: Obstetrics and Gynecology

## 2017-11-07 ENCOUNTER — Encounter: Payer: Self-pay | Admitting: Obstetrics and Gynecology

## 2017-11-07 ENCOUNTER — Ambulatory Visit (HOSPITAL_BASED_OUTPATIENT_CLINIC_OR_DEPARTMENT_OTHER)
Admission: RE | Admit: 2017-11-07 | Discharge: 2017-11-07 | Disposition: A | Discharge status: Home / Self Care | Payer: 59 | Source: Ambulatory Visit | Attending: Obstetrics and Gynecology | Admitting: Obstetrics and Gynecology

## 2017-11-07 VITALS — BP 126/80 | HR 93 | Wt 207.5 lb

## 2017-11-07 DIAGNOSIS — O10919 Unspecified pre-existing hypertension complicating pregnancy, unspecified trimester: Secondary | ICD-10-CM

## 2017-11-07 DIAGNOSIS — O09523 Supervision of elderly multigravida, third trimester: Secondary | ICD-10-CM

## 2017-11-07 DIAGNOSIS — O0990 Supervision of high risk pregnancy, unspecified, unspecified trimester: Secondary | ICD-10-CM

## 2017-11-07 DIAGNOSIS — O09293 Supervision of pregnancy with other poor reproductive or obstetric history, third trimester: Secondary | ICD-10-CM

## 2017-11-07 DIAGNOSIS — O288 Other abnormal findings on antenatal screening of mother: Secondary | ICD-10-CM

## 2017-11-07 DIAGNOSIS — Z3A33 33 weeks gestation of pregnancy: Secondary | ICD-10-CM | POA: Insufficient documentation

## 2017-11-07 DIAGNOSIS — O10013 Pre-existing essential hypertension complicating pregnancy, third trimester: Secondary | ICD-10-CM

## 2017-11-07 DIAGNOSIS — O24913 Unspecified diabetes mellitus in pregnancy, third trimester: Secondary | ICD-10-CM

## 2017-11-07 DIAGNOSIS — O4103X0 Oligohydramnios, third trimester, not applicable or unspecified: Secondary | ICD-10-CM

## 2017-11-07 DIAGNOSIS — O99213 Obesity complicating pregnancy, third trimester: Secondary | ICD-10-CM

## 2017-11-07 DIAGNOSIS — O24415 Gestational diabetes mellitus in pregnancy, controlled by oral hypoglycemic drugs: Secondary | ICD-10-CM

## 2017-11-07 IMAGING — US US MFM FETAL BPP W/O NON-STRESS
1 series · 15 of 27 positions shown · non-contrast
Comparison: none

[Series 1: us mfm fetal bpp w/o non-stress · 27 acquisitions, 15 frames shown]
[im 1/27]
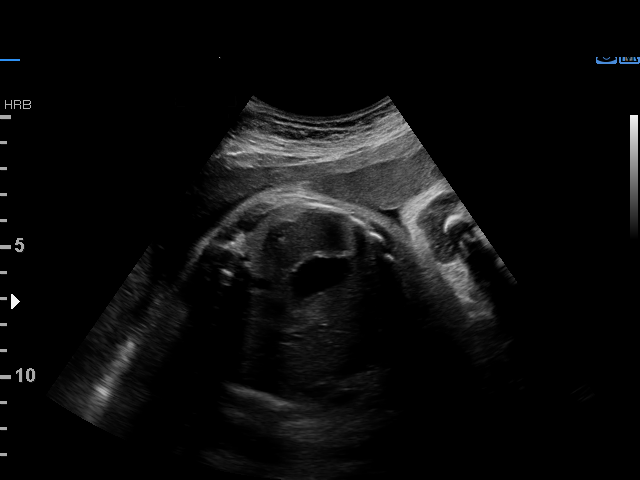
[im 3/27]
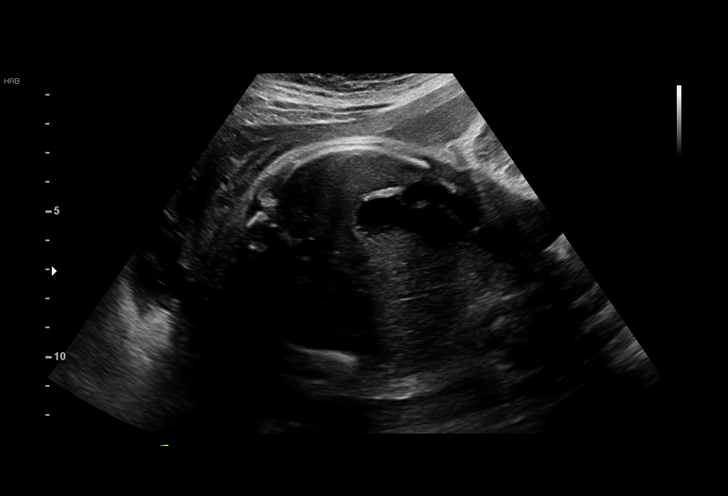
[im 5/27]
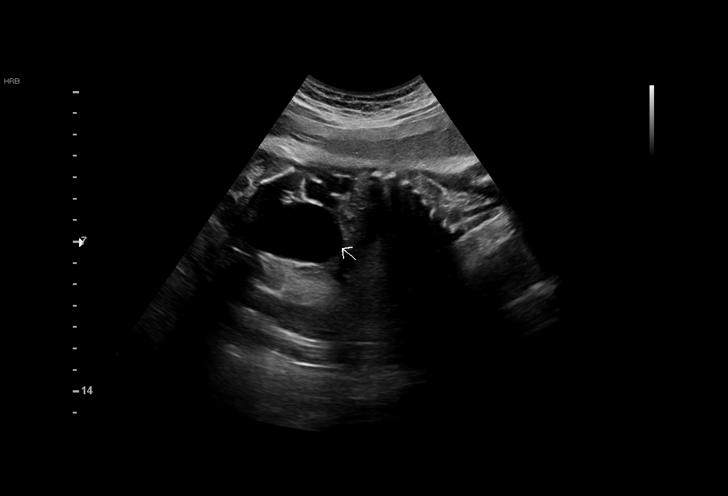
[im 7/27]
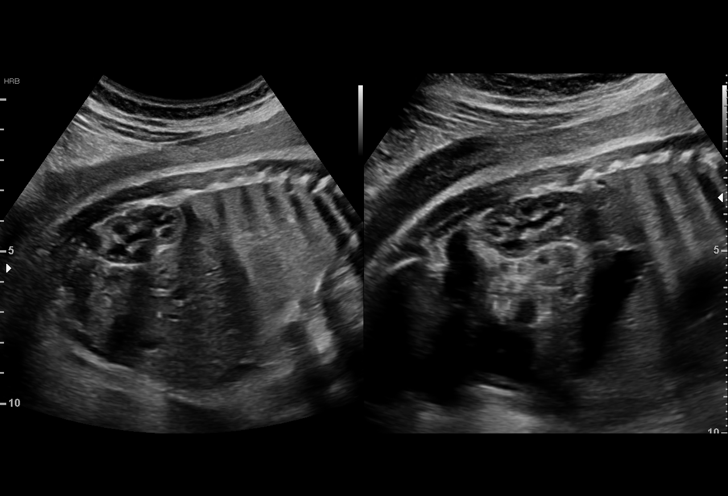
[im 9/27]
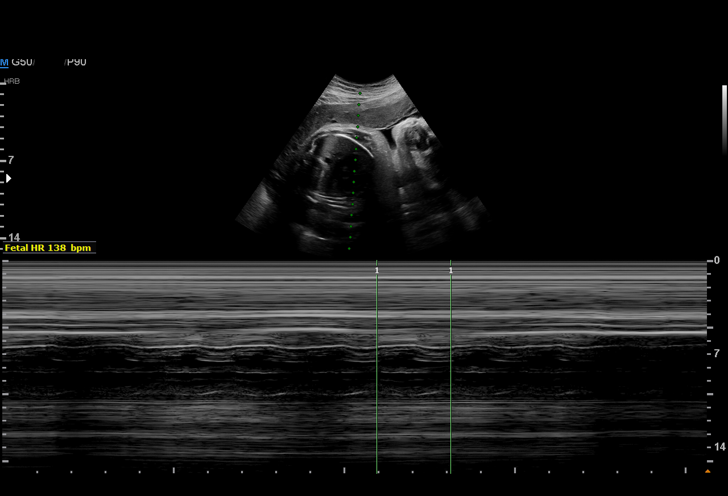
[im 10/27]
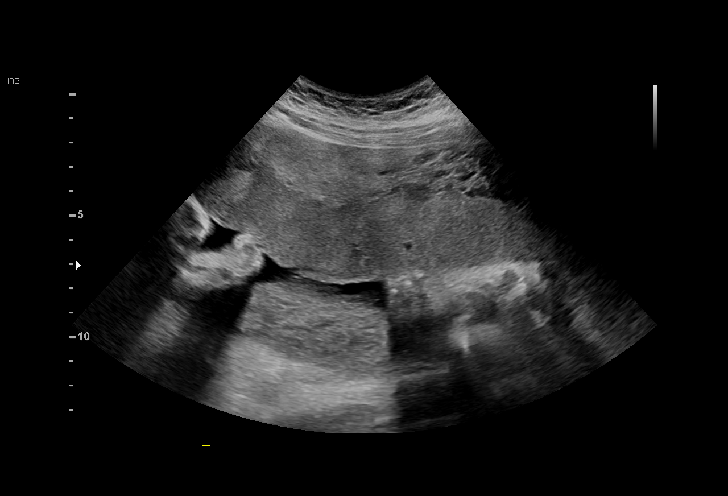
[im 12/27]
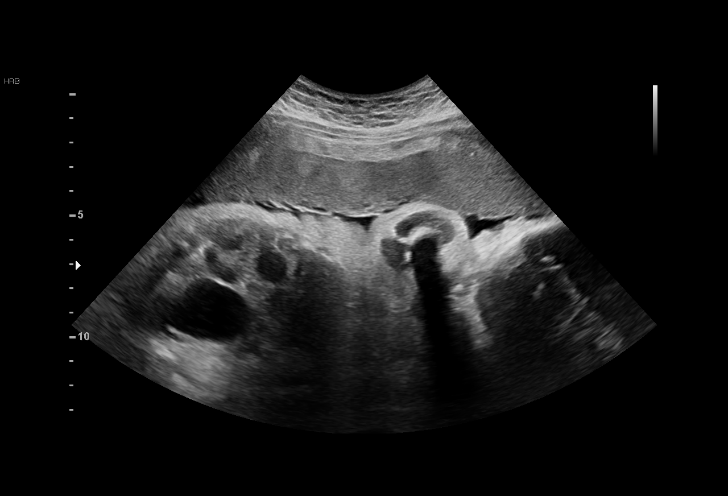
[im 14/27]
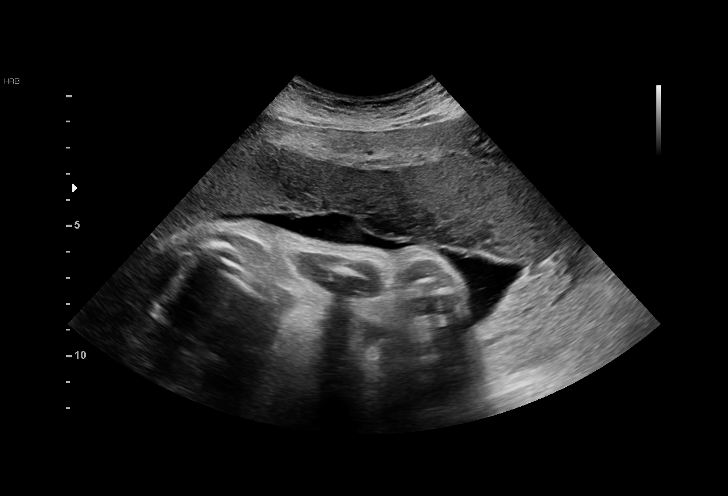
[im 16/27]
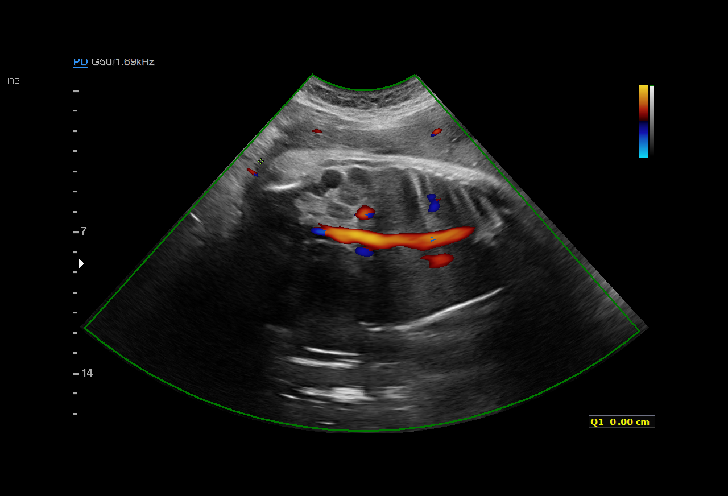
[im 18/27]
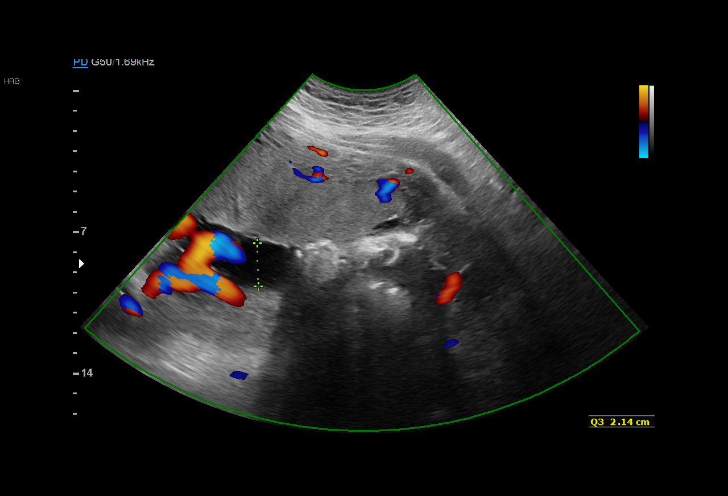
[im 19/27]
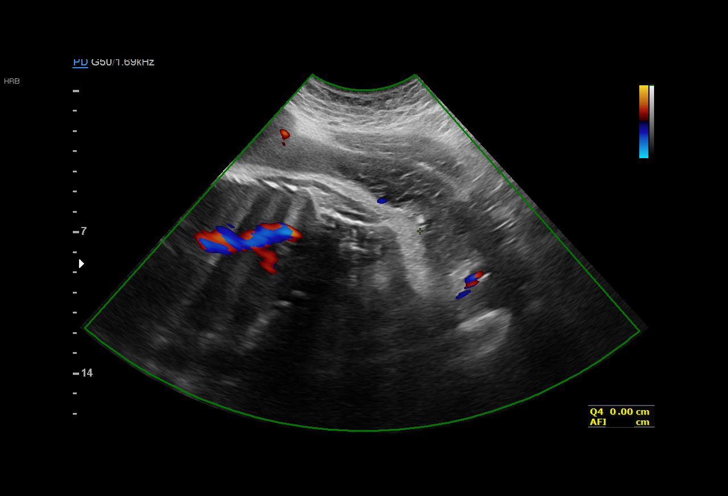
[im 21/27]
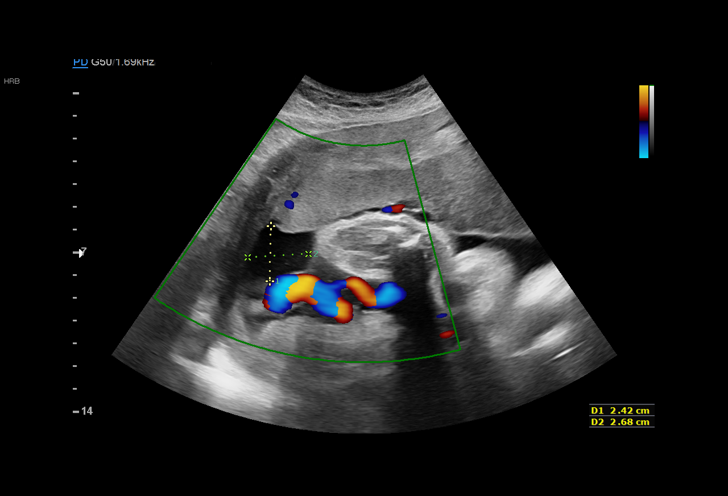
[im 23/27]
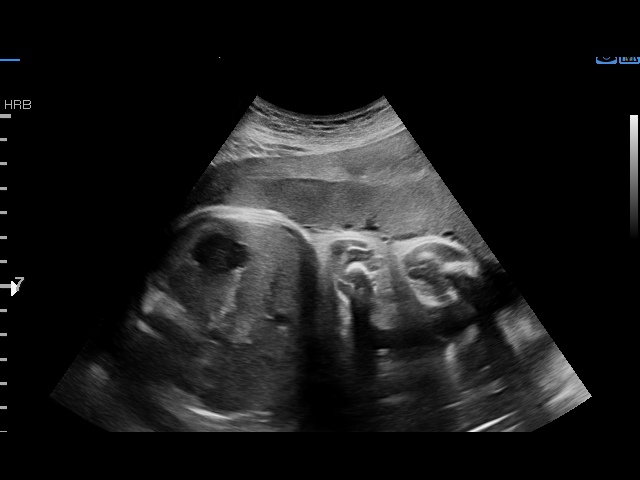
[im 25/27]
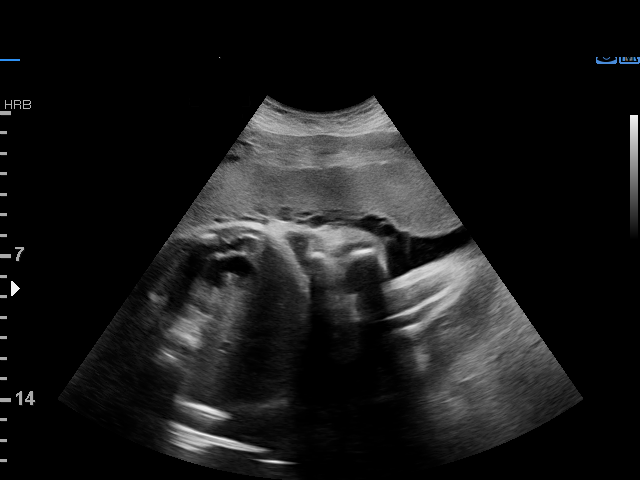
[im 27/27]
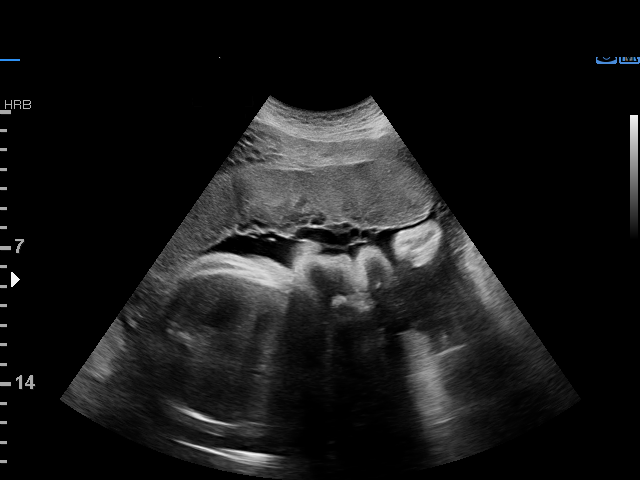

[15 of 27 positions shown; findings below may reference images not displayed]

Attending:        Osman Osko Rukelj      Secondary Phy.:   [facility]-
MAU/Triage
[REDACTED]care - [HOSPITAL]

Indications

33 weeks gestation of pregnancy
Poor obstetric history: Previous fetal growth
restriction (FGR)
Hypertension - Chronic/Pre-existing (ASA)
Obesity complicating pregnancy, third
trimester (pregravid BMI 33)
Advanced maternal age multigravida 39,
third trimester (low risk NIPS)
Gestational diabetes in pregnancy,
controlled by oral hypoglycemic drugs
Vital Signs

BMI:
Fetal Evaluation

Num Of Fetuses:         1
Fetal Heart Rate(bpm):  138
Cardiac Activity:       Observed
Presentation:           Cephalic
Placenta:               Anterior

Amniotic Fluid
AFI FV:      Mild Oligohydramnios

AFI Sum(cm)     %Tile       Largest Pocket(cm)
5.8             < 3
RUQ(cm)       RLQ(cm)       LUQ(cm)        LLQ(cm)
0             0
Biophysical Evaluation

Amniotic F.V:   Within normal limits       F. Tone:        Observed
F. Movement:    Observed                   Score:          [DATE]
F. Breathing:   Observed
OB History

Gravidity:    5         Term:   2        Prem:   0        SAB:   2
TOP:          0       Ectopic:  0        Living: 2
Gestational Age

LMP:           33w 4d        Date:  03/17/17                 EDD:   12/22/17
Best:          33w 4d     Det. By:  LMP  (03/17/17)          EDD:   12/22/17
Anatomy

Thoracic:              Appears normal         Abdomen:                Appears normal
Heart:                 Appears normal         Kidneys:                Appear normal
(4CH, axis, and situs
Stomach:               Appears normal, left   Bladder:                Appears normal
sided
Cervix Uterus Adnexa

Cervix
Not visualized (advanced GA >12wks)
Comments

Comments:
U/S images reviewed.  Findings reviewed with patient.  No
evidence of fetal compromise is found on today's BPP.  Mild
oligohydramnios (FCV-2.T cm) is present.  Patient encouraged
to increase her fluid intake to at least 8 x 8 oz glasses water
daily.
Questions answered.
15 minutes spent face to face.
Recommendations: 1) Twice weekly BPP  2) Serial U/S every
4 weeks for fetal growth
Recommendations

1) Twice weekly BPP  2) Serial U/S every 4 weeks for fetal
growth

## 2017-11-07 NOTE — Progress Notes (Signed)
   PRENATAL VISIT NOTE  Subjective:  Tracey BiddingCindy Schnee is a 42 y.o. [id number] at 5223w4d being seen today for ongoing prenatal care.  She is currently monitored for the following issues for this high-risk pregnancy and has Chronic hypertension during pregnancy, antepartum; Anxiety, generalized; Supervision of high risk pregnancy, antepartum; History of prior pregnancy with IUGR newborn; Vitamin D deficiency; AMA (advanced maternal age) multigravida 35+; Carpal tunnel syndrome during pregnancy; Heartburn during pregnancy, antepartum; and Diabetes mellitus affecting pregnancy in third trimester on their problem list.  Patient reports no complaints.  Contractions: Irregular. Vag. Bleeding: None.  Movement: Present. Denies leaking of fluid.   The following portions of the patient's history were reviewed and updated as appropriate: allergies, current medications, past family history, past medical history, past social history, past surgical history and problem list. Problem list updated.  Objective:   Vitals:   11/07/17 0834  BP: 126/80  Pulse: 93  Weight: 207 lb 8 oz (94.1 kg)    Fetal Status: Fetal Heart Rate (bpm): NST   Movement: Present     General:  Alert, oriented and cooperative. Patient is in no acute distress.  Skin: Skin is warm and dry. No rash noted.   Cardiovascular: Normal heart rate noted  Respiratory: Normal respiratory effort, no problems with respiration noted  Abdomen: Soft, gravid, appropriate for gestational age.  Pain/Pressure: Present     Pelvic: Cervical exam deferred        Extremities: Normal range of motion.  Edema: None  Mental Status: Normal mood and affect. Normal behavior. Normal judgment and thought content.   Assessment and Plan:  Pregnancy: [id number] at 6723w4d  1. Supervision of high risk pregnancy, antepartum Patient is doing well without complaints - Fetal nonstress test  2. Diabetes mellitus affecting pregnancy in third trimester CBGs reviewed and majority  within range on metformin NST reviewed and non reactive with baseline 140, mod variability, 10x10 accels, no decels Patient with BPP yesterday- report not available. Patient sent for repeat BPP today - US MFM FETAL BPP WO NON STRESS; Future  3. Chronic hypertension during pregnancy, antepartum Normotensive Continue ASA - US MFM FETAL BPP WO NON STRESS; Future  4. Multigravida of advanced maternal age in third trimester   Preterm labor symptoms and general obstetric precautions including but not limited to vaginal bleeding, contractions, leaking of fluid and fetal movement were reviewed in detail with the patient. Please refer to After Visit Summary for other counseling recommendations.  Return in about 1 week (around 11/14/2017) for ROB, NST.  Future Appointments  Date Time Provider Department Center  11/07/2017  9:45 AM [facility] US 5 [facility]  11/13/2017  3:00 PM Adam PhenixArnold, James G, MD [facility] None  11/16/2017 10:30 AM [facility] US 5 [facility]    Catalina AntiguaPeggy Sherrol Vicars, MD

## 2017-11-07 NOTE — Progress Notes (Signed)
Pt presents for ROB/NST. CBG reading available.

## 2017-11-09 ENCOUNTER — Ambulatory Visit (HOSPITAL_BASED_OUTPATIENT_CLINIC_OR_DEPARTMENT_OTHER)
Admission: RE | Admit: 2017-11-09 | Discharge: 2017-11-09 | Disposition: A | Discharge status: Home / Self Care | Payer: 59 | Source: Ambulatory Visit | Attending: Obstetrics and Gynecology | Admitting: Obstetrics and Gynecology

## 2017-11-09 ENCOUNTER — Encounter (HOSPITAL_COMMUNITY): Payer: Self-pay | Admitting: *Deleted

## 2017-11-09 ENCOUNTER — Encounter (HOSPITAL_COMMUNITY): Payer: Self-pay

## 2017-11-09 ENCOUNTER — Other Ambulatory Visit: Payer: Self-pay

## 2017-11-09 ENCOUNTER — Inpatient Hospital Stay (HOSPITAL_COMMUNITY)
Admission: AD | Admit: 2017-11-09 | Discharge: 2017-11-11 | DRG: 832 | Disposition: A | Discharge status: Home / Self Care | Payer: 59 | Attending: Obstetrics and Gynecology | Admitting: Obstetrics and Gynecology

## 2017-11-09 DIAGNOSIS — Z3A34 34 weeks gestation of pregnancy: Secondary | ICD-10-CM | POA: Diagnosis not present

## 2017-11-09 DIAGNOSIS — O09293 Supervision of pregnancy with other poor reproductive or obstetric history, third trimester: Secondary | ICD-10-CM | POA: Diagnosis not present

## 2017-11-09 DIAGNOSIS — O10013 Pre-existing essential hypertension complicating pregnancy, third trimester: Secondary | ICD-10-CM

## 2017-11-09 DIAGNOSIS — Z3A33 33 weeks gestation of pregnancy: Secondary | ICD-10-CM | POA: Diagnosis not present

## 2017-11-09 DIAGNOSIS — O09523 Supervision of elderly multigravida, third trimester: Secondary | ICD-10-CM | POA: Diagnosis not present

## 2017-11-09 DIAGNOSIS — O99213 Obesity complicating pregnancy, third trimester: Secondary | ICD-10-CM

## 2017-11-09 DIAGNOSIS — O99333 Smoking (tobacco) complicating pregnancy, third trimester: Secondary | ICD-10-CM | POA: Diagnosis present

## 2017-11-09 DIAGNOSIS — O24415 Gestational diabetes mellitus in pregnancy, controlled by oral hypoglycemic drugs: Secondary | ICD-10-CM | POA: Diagnosis present

## 2017-11-09 DIAGNOSIS — F17210 Nicotine dependence, cigarettes, uncomplicated: Secondary | ICD-10-CM | POA: Diagnosis present

## 2017-11-09 DIAGNOSIS — O4100X0 Oligohydramnios, unspecified trimester, not applicable or unspecified: Secondary | ICD-10-CM

## 2017-11-09 DIAGNOSIS — O4103X0 Oligohydramnios, third trimester, not applicable or unspecified: Principal | ICD-10-CM | POA: Diagnosis present

## 2017-11-09 DIAGNOSIS — O288 Other abnormal findings on antenatal screening of mother: Secondary | ICD-10-CM

## 2017-11-09 HISTORY — DX: Oligohydramnios, unspecified trimester, not applicable or unspecified: O41.00X0

## 2017-11-09 LAB — WET PREP, GENITAL
Sperm: NONE SEEN
Trich, Wet Prep: NONE SEEN
Yeast Wet Prep HPF POC: NONE SEEN

## 2017-11-09 LAB — POCT FERN TEST: POCT FERN TEST: NEGATIVE

## 2017-11-09 LAB — CBC
HCT: 35 % — ABNORMAL LOW (ref 36.0–46.0)
Hemoglobin: 11.4 g/dL — ABNORMAL LOW (ref 12.0–15.0)
MCH: 28.6 pg (ref 26.0–34.0)
MCHC: 32.6 g/dL (ref 30.0–36.0)
MCV: 87.9 fL (ref 78.0–100.0)
PLATELETS: 271 10*3/uL (ref 150–400)
RBC: 3.98 MIL/uL (ref 3.87–5.11)
RDW: 14.4 % (ref 11.5–15.5)
WBC: 8.1 10*3/uL (ref 4.0–10.5)

## 2017-11-09 LAB — GLUCOSE, CAPILLARY
GLUCOSE-CAPILLARY: 102 mg/dL — AB (ref 70–99)
GLUCOSE-CAPILLARY: 98 mg/dL (ref 70–99)
GLUCOSE-CAPILLARY: 108 mg/dL — AB (ref 70–99)
Glucose-Capillary: 102 mg/dL — ABNORMAL HIGH (ref 70–99)
Glucose-Capillary: 104 mg/dL — ABNORMAL HIGH (ref 70–99)

## 2017-11-09 LAB — TYPE AND SCREEN
ABO/RH(D): B POS
ANTIBODY SCREEN: NEGATIVE

## 2017-11-09 LAB — AMNISURE RUPTURE OF MEMBRANE (ROM) NOT AT ARMC: Amnisure ROM: NEGATIVE

## 2017-11-09 IMAGING — US US MFM FETAL BPP W/O NON-STRESS
1 series · 12 of 28 positions shown · non-contrast
Comparison: none

[Series 1: us mfm fetal bpp w/o non-stress · 32 acquisitions, 12 frames shown]
[im 2/32]
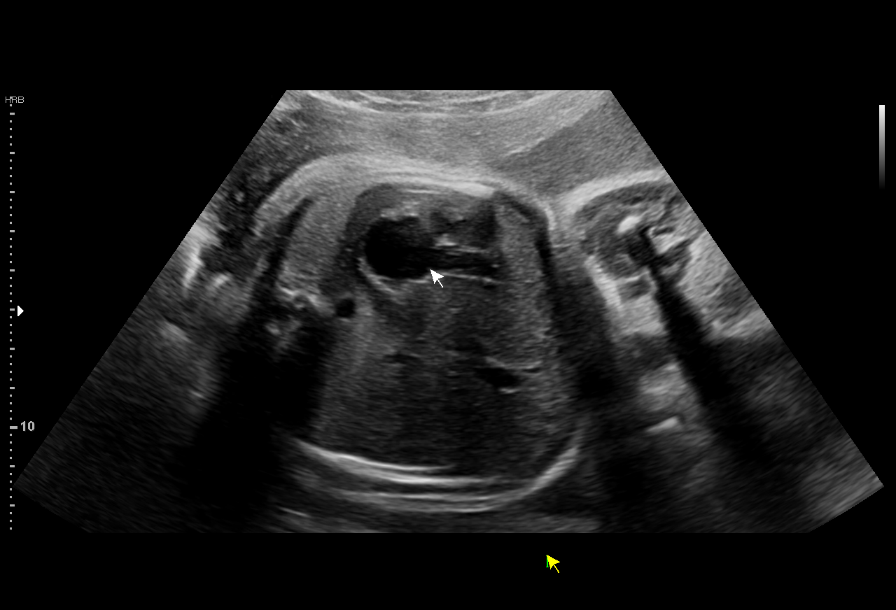
[im 4/32]
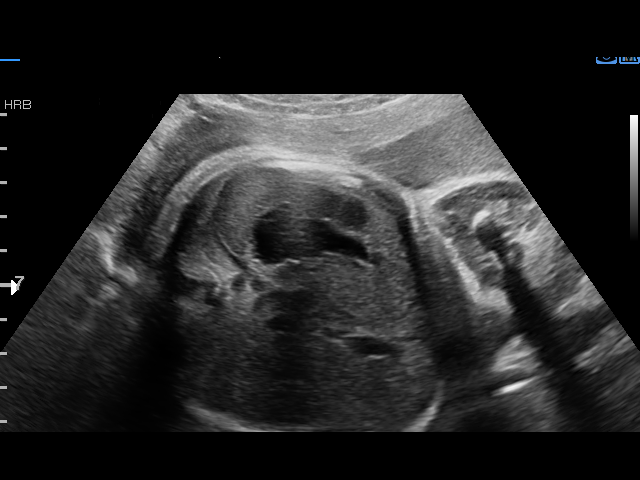
[im 6/32]
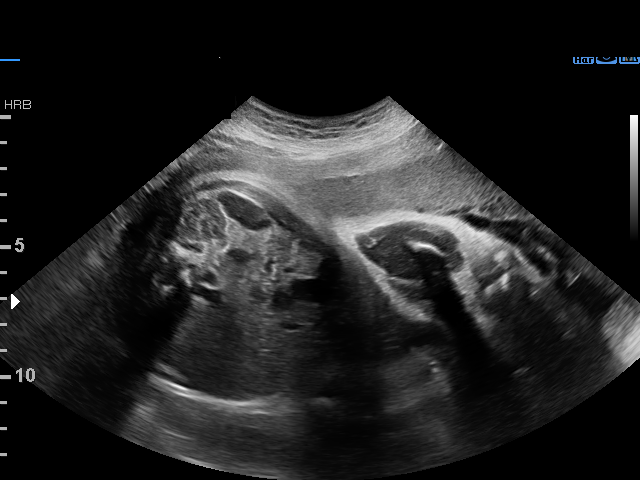
[im 10/32]
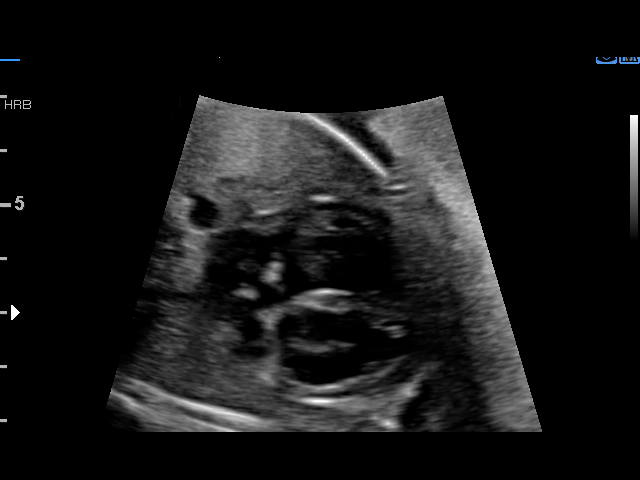
[im 12/32]
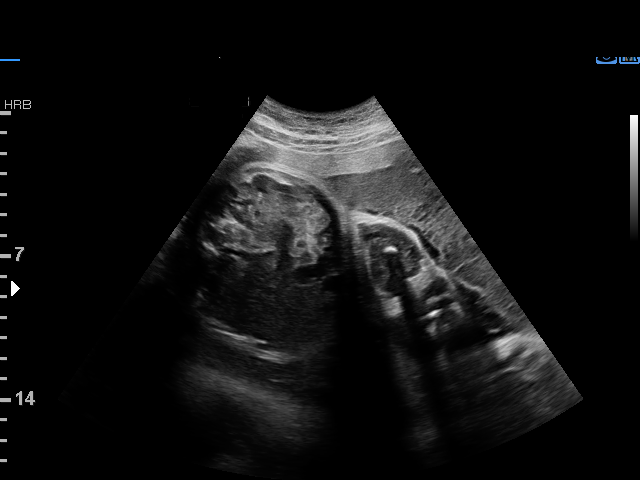
[im 14/32]
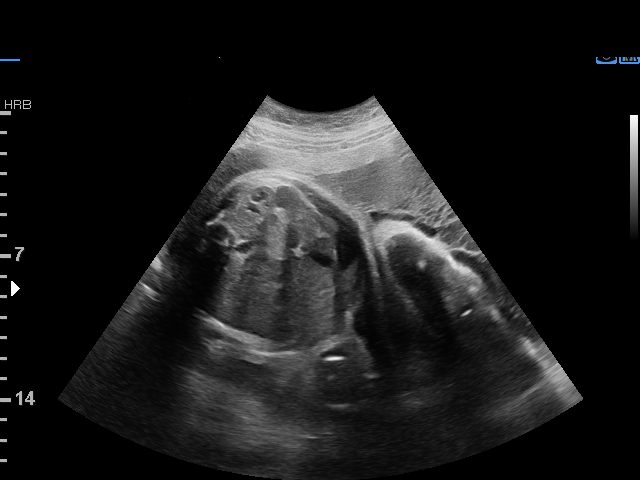
[im 18/32]
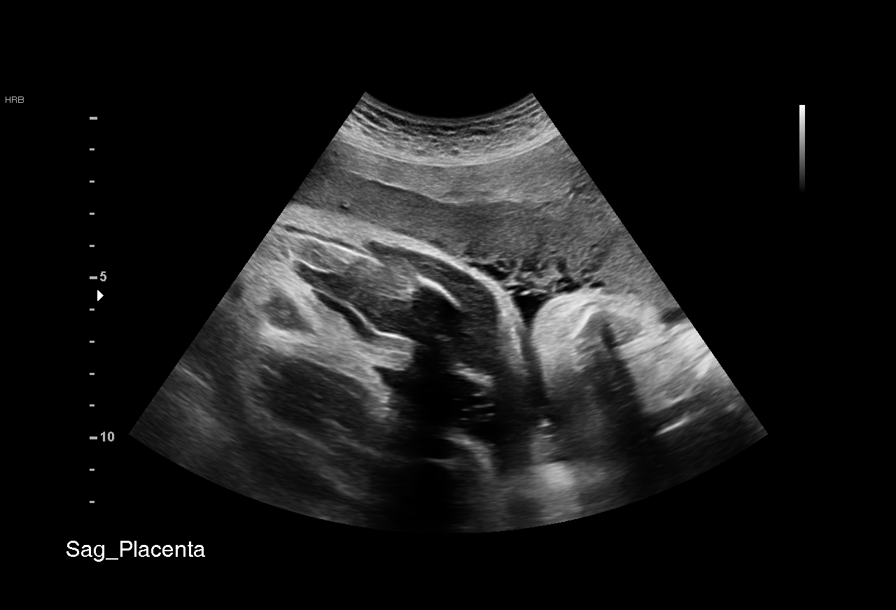
[im 20/32]
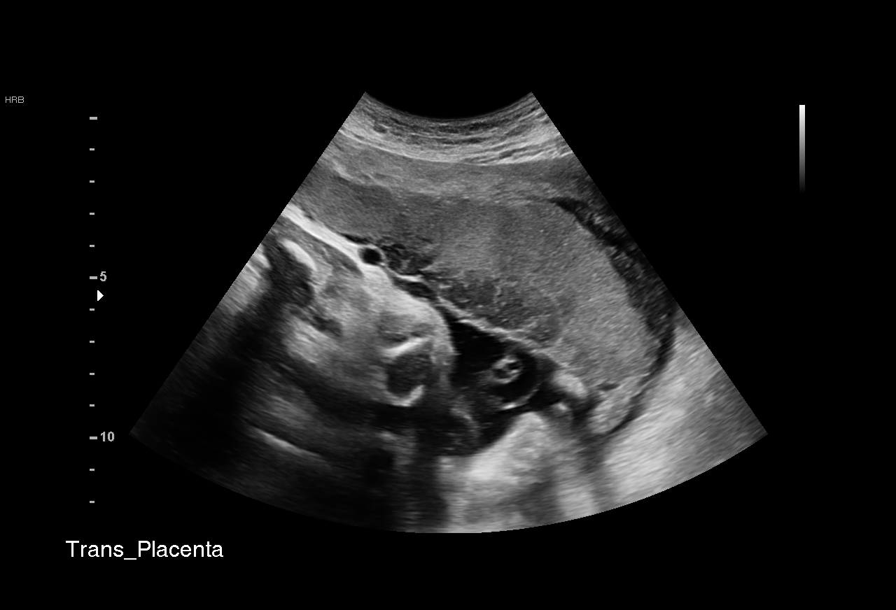
[im 22/32]
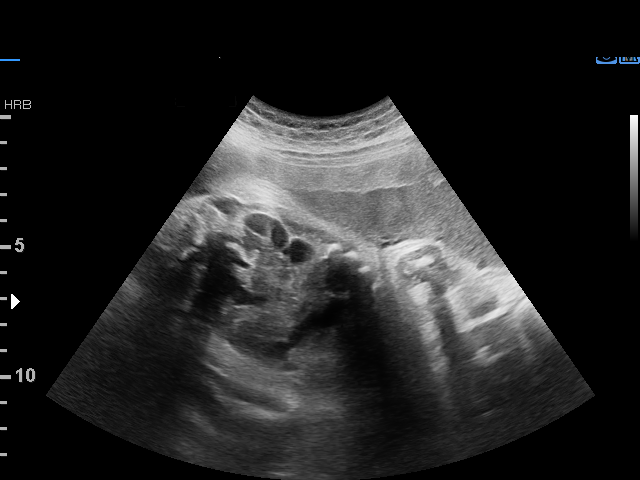
[im 26/32]
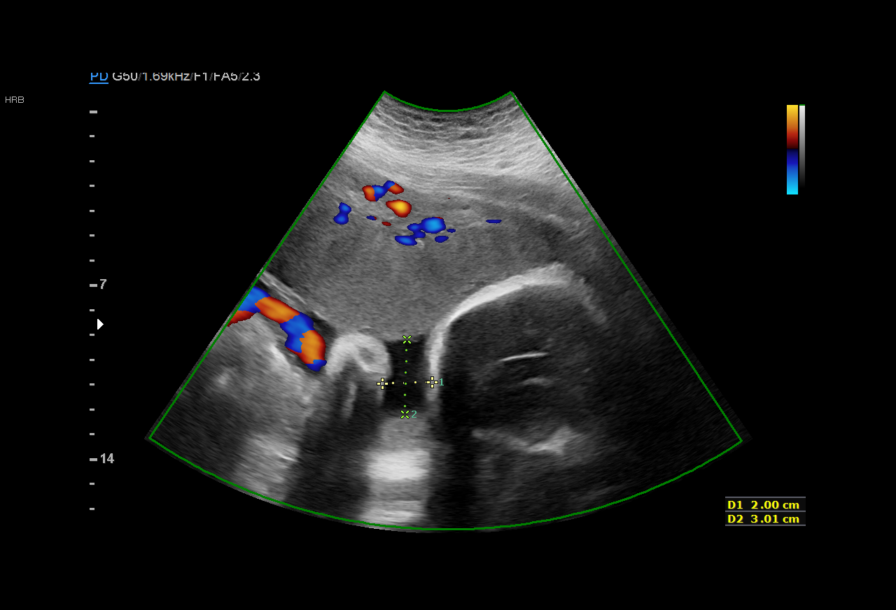
[im 28/32]
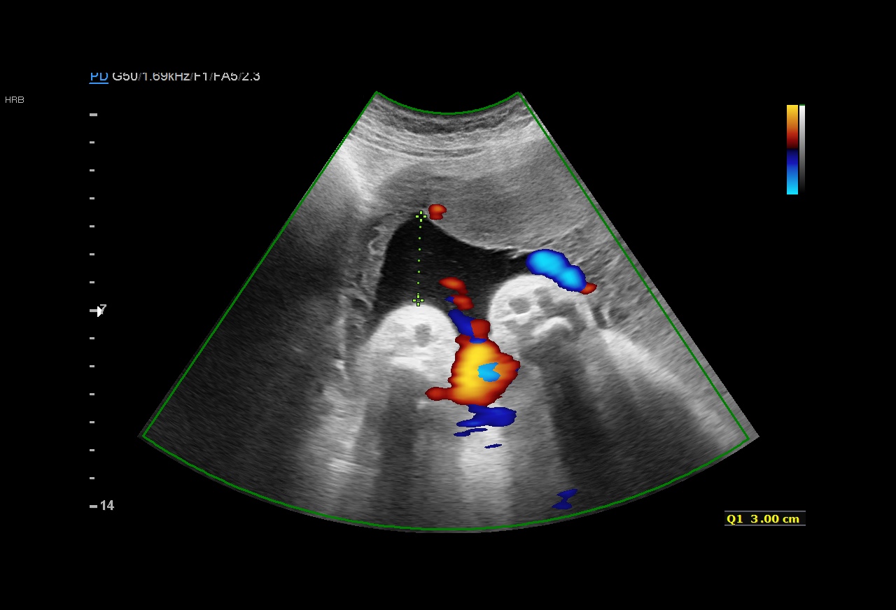
[im 30/32]
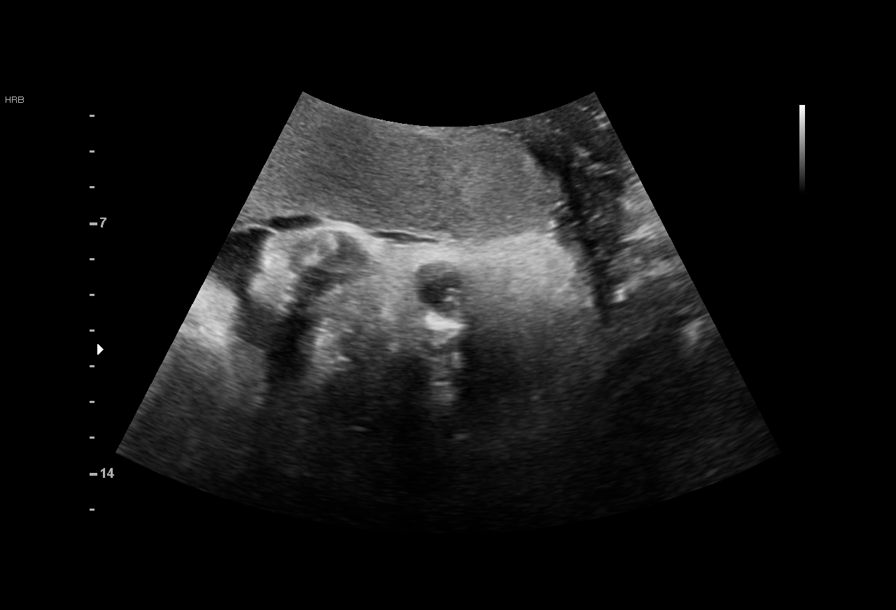

[12 of 28 positions shown; findings below may reference images not displayed]

Attending:        Boudj Lok        Secondary Phy.:   [facility]-
MAU/Triage
[REDACTED]care - [HOSPITAL]

Indications

33 weeks gestation of pregnancy
Poor obstetric history: Previous fetal growth
restriction (FGR)
Hypertension - Chronic/Pre-existing (ASA)
Obesity complicating pregnancy, third
trimester (pregravid BMI 33)
Advanced maternal age multigravida 39,
third trimester (low risk NIPS)
Gestational diabetes in pregnancy,
controlled by oral hypoglycemic drugs
Vital Signs

Height:        5'6"
Fetal Evaluation

Num Of Fetuses:         1
Fetal Heart Rate(bpm):  141
Cardiac Activity:       Observed
Presentation:           Cephalic
Placenta:               Anterior

Amniotic Fluid
AFI FV:      Oligohydramnios

AFI Sum(cm)     %Tile       Largest Pocket(cm)
3.05            < 3
RUQ(cm)       RLQ(cm)       LUQ(cm)        LLQ(cm)
1.5           0             0
Biophysical Evaluation

Amniotic F.V:   Pocket => 2 cm two         F. Tone:        Observed
planes
F. Movement:    Observed                   Score:          [DATE]
F. Breathing:   Observed
OB History

Gravidity:    5         Term:   2        Prem:   0        SAB:   2
TOP:          0       Ectopic:  0        Living: 2
Gestational Age

LMP:           33w 6d        Date:  03/17/17                 EDD:   12/22/17
Best:          33w 6d     Det. By:  LMP  (03/17/17)          EDD:   12/22/17
Anatomy

Thoracic:              Appears normal         Abdomen:                Appears normal
Heart:                 Appears normal         Kidneys:                Appear normal
(4CH, axis, and situs
Stomach:               Appears normal, left   Bladder:                Appears normal
sided
Cervix Uterus Adnexa

Cervix
Not visualized (advanced GA >30wks)
Impression

Low amniotic fluid on previous scans. Patient reports leakage
of amniotic fluid now.
Amniotic fluid is decreased (oligohydramnios). Cephalic
presentation. Antenatal testing is reassuring.
I discussed the possibility of PPROM and recommended
admission for evaluation.
Patient was sent over to the [facility] for evaluation.
Recommendations

If PPROM is confirmed, Jacanamejoy course (2 days) and
deliver.
If PPROM is ruled out, inpatient management and
reevaluation of amniotic fluid in 24 to 48 hours before
discharge.

## 2017-11-09 MED ORDER — LACTATED RINGERS IV SOLN: INTRAVENOUS | Status: DC

## 2017-11-09 MED ORDER — BETAMETHASONE SOD PHOS & ACET 6 (3-3) MG/ML IJ SUSP
12.0000 mg | INTRAMUSCULAR | Status: AC
  Administered 2017-11-09 – 2017-11-10 (×2): 12 mg via INTRAMUSCULAR
  Filled 2017-11-09 (×2): qty 2

## 2017-11-09 MED ORDER — CALCIUM CARBONATE ANTACID 500 MG PO CHEW: 2.0000 | CHEWABLE_TABLET | ORAL | Status: DC | PRN

## 2017-11-09 MED ORDER — METFORMIN HCL 500 MG PO TABS
500.0000 mg | ORAL_TABLET | Freq: Two times a day (BID) | ORAL | Status: DC
  Administered 2017-11-09 – 2017-11-11 (×4): 500 mg via ORAL
  Filled 2017-11-09 (×6): qty 1

## 2017-11-09 MED ORDER — PANTOPRAZOLE SODIUM 40 MG PO TBEC
40.0000 mg | DELAYED_RELEASE_TABLET | Freq: Every day | ORAL | Status: DC
  Administered 2017-11-10 – 2017-11-11 (×2): 40 mg via ORAL
  Filled 2017-11-09 (×2): qty 1

## 2017-11-09 MED ORDER — LACTATED RINGERS IV SOLN
INTRAVENOUS | Status: AC
  Administered 2017-11-09 (×2): via INTRAVENOUS

## 2017-11-09 MED ORDER — PRENATAL MULTIVITAMIN CH
1.0000 | ORAL_TABLET | Freq: Every day | ORAL | Status: DC
  Administered 2017-11-10 – 2017-11-11 (×2): 1 via ORAL
  Filled 2017-11-09 (×2): qty 1

## 2017-11-09 MED ORDER — INSULIN ASPART 100 UNIT/ML ~~LOC~~ SOLN: 0.0000 [IU] | Freq: Four times a day (QID) | SUBCUTANEOUS | Status: DC

## 2017-11-09 MED ORDER — RISAQUAD PO CAPS
1.0000 | ORAL_CAPSULE | Freq: Every day | ORAL | Status: DC
  Administered 2017-11-10 – 2017-11-11 (×2): 1 via ORAL
  Filled 2017-11-09 (×3): qty 1

## 2017-11-09 MED ORDER — ASPIRIN 81 MG PO CHEW
81.0000 mg | CHEWABLE_TABLET | Freq: Every day | ORAL | Status: DC
  Administered 2017-11-09 – 2017-11-11 (×3): 81 mg via ORAL
  Filled 2017-11-09 (×4): qty 1

## 2017-11-09 MED ORDER — ACETAMINOPHEN 325 MG PO TABS
650.0000 mg | ORAL_TABLET | ORAL | Status: DC | PRN
Start: 2017-11-09 — End: 2017-11-11
  Administered 2017-11-10: 650 mg via ORAL
  Filled 2017-11-09: qty 2

## 2017-11-09 MED ORDER — DOCUSATE SODIUM 100 MG PO CAPS: 100.0000 mg | ORAL_CAPSULE | Freq: Two times a day (BID) | ORAL | Status: DC | PRN

## 2017-11-09 MED ORDER — DOCUSATE SODIUM 100 MG PO CAPS: 100.0000 mg | ORAL_CAPSULE | Freq: Every day | ORAL | Status: DC

## 2017-11-09 MED ORDER — PROBIOTIC DAILY PO CAPS: 1.0000 | ORAL_CAPSULE | Freq: Every day | ORAL | Status: DC

## 2017-11-09 MED ORDER — LACTATED RINGERS IV BOLUS: 1000.0000 mL | Freq: Once | INTRAVENOUS | Status: DC

## 2017-11-09 MED ORDER — ZOLPIDEM TARTRATE 5 MG PO TABS: 5.0000 mg | ORAL_TABLET | Freq: Every evening | ORAL | Status: DC | PRN

## 2017-11-09 NOTE — ED Notes (Signed)
Pt reports leaking very small amts of clear fluid x 2-3 days.

## 2017-11-09 NOTE — MAU Provider Note (Signed)
First Provider Initiated Contact with Patient 11/09/17 1124       S: [name] is a 42 y.o. [id number] at 6551w6d  who presents to MAU today complaining of leaking of fluid since 10/30/17. She was seen in MFM, and noted to have oligo today, and in the presence of concern for leaking she was sent in for evaluation of ROM. She denies vaginal bleeding. She denies contractions. She reports normal fetal movement.    O: BP 137/80 (BP Location: Right Arm)   Pulse 63   Temp 98.5 F (36.9 C) (Oral)   Resp 18   LMP 03/17/2017  GENERAL: Well-developed, well-nourished female in no acute distress.  HEAD: Normocephalic, atraumatic.  CHEST: Normal effort of breathing, regular heart rate ABDOMEN: Soft, nontender, gravid PELVIC: Normal external female genitalia. Vagina is pink and rugated. Cervix with normal contour, no lesions. Normal discharge.  NO pooling.   Cervical exam:      Fetal Monitoring: Baseline: 145 Variability: moderate Accelerations: 15x15 Decelerations: none Contractions: irregular   Results for orders placed or performed during the hospital encounter of 11/09/17 (from the past 24 hour(s))  Wet prep, genital     Status: Abnormal   Collection Time: 11/09/17 11:36 AM  Result Value Ref Range   Yeast Wet Prep HPF POC NONE SEEN NONE SEEN   Trich, Wet Prep NONE SEEN NONE SEEN   Clue Cells Wet Prep HPF POC PRESENT (A) NONE SEEN   WBC, Wet Prep HPF POC MANY (A) NONE SEEN   Sperm NONE SEEN   Am[facility]isure rupture of membrane (rom)[facility]     Status: None   Collection Time: 11/09/17 11:36 AM  Result Value Ref Range   Amnisure ROM NEGATIVE   Fern Test     Status: Normal   Collection Time: 11/09/17 11:40 AM  Result Value Ref Range   POCT Fern Test Negative = intact amniotic membranes    12:36 PM Consult with Dr. Vergie LivingPickens, will put in admit orders   A: SIUP at 2251w6d  Membranes intact  Oligohydramnios   P: Admit to ante  Armando ReichertHogan, Shandiin Eisenbeis D, CNM 11/09/2017 12:35 PM

## 2017-11-09 NOTE — ED Notes (Signed)
[facility] per Dr. Judeth CornfieldShankar.  Pt to be further evaluated for oligo and leaking of fluid.  Pt ambulated and checked into MAU.

## 2017-11-09 NOTE — Procedures (Deleted)
Tracey [name] 16-Mar-1976 3937w6d  Fetus A Non-Stress Test Interpretation for 11/09/17  Indication: Unsatisfactory BPP  Fetal Heart Rate A Mode: External Baseline Rate (A): 135 bpm Variability: Moderate Accelerations: 15 x 15 Decelerations: None Multiple birth?: No  Uterine Activity Mode: Palpation, Toco Contraction Frequency (min): 3 Contraction Duration (sec): 40-60 Contraction Quality: Mild(pt denies feeling any) Resting Tone Palpated: Relaxed Resting Time: Adequate  Interpretation (Fetal Testing) Nonstress Test Interpretation: Reactive Overall Impression: Reassuring for gestational age Comments: Reviewed with Dr. Judeth CornfieldShankar

## 2017-11-09 NOTE — MAU Note (Signed)
Pt sent from MFM, pt thinks she may be leaking fluid, underwear has been wet for the last 2 days.  Has been having contractions for "a while," denies bleeding.  Reports good fetal movement.

## 2017-11-09 NOTE — MAU Note (Signed)
Urine sent to lab 

## 2017-11-09 NOTE — H&P (Signed)
Obstetrics Admission History & Physical  11/09/2017 - 3:37 PM Primary OBGYN: [facility]  Chief Complaint: oligo  History of Present Illness  42 y.o. [id number] @ [redacted]w[redacted]d with the above CC. Pregnancy complicated by: cHTN (no meds), AMA, GDMA2 (PO meds), h/o FGR, h/o HA, anxiety.   Patient has had some slightly decreasing AFIs and had oligo today and stated she had been having some LOF so sent to MAU for PPROM rule out. Fern and amnisure were negative and patient admitted with plans for repeat BPP in 24-48 hours.   No s/s of PTL  Review of Systems:  as noted in the History of Present Illness.  Patient Active Problem List   Diagnosis Date Noted  . Oligohydramnios antepartum 11/09/2017  . Oligohydramnios 11/09/2017  . GDM (gestational diabetes mellitus) 09/28/2017  . Carpal tunnel syndrome during pregnancy 09/06/2017  . Heartburn during pregnancy, antepartum 09/06/2017  . AMA (advanced maternal age) multigravida 35+ 06/11/2017  . Vitamin D deficiency 06/07/2017  . Supervision of high risk pregnancy, antepartum 06/04/2017  . History of prior pregnancy with IUGR newborn 06/04/2017  . Chronic hypertension during pregnancy, antepartum 02/09/2016  . Anxiety, generalized 02/09/2016    PMHx:  Past Medical History:  Diagnosis Date  . Anxiety   . Bronchitis   . Carpal tunnel syndrome on both sides   . Depression   . Family history of adverse reaction to anesthesia    mother had problems waking up from surgery.  . Headache    migraines  . Hypertension   . Panic attack 2017   diagnosed 3 months ago. no meds presently  . Reflux    did not filll prescription   PSHx:  Past Surgical History:  Procedure Laterality Date  . BREAST BIOPSY  right    cyst  . DILATION AND EVACUATION N/A 07/26/2015   Procedure: DILATATION AND EVACUATION;  Surgeon: CShelly Bombard MD;  [facility];  Service: Gynecology;  Laterality: N/A;   Medications:  Medications Prior to Admission  Medication Sig Dispense  Refill Last Dose  . metFORMIN (GLUCOPHAGE) 500 MG tablet Take 1 tablet (500 mg total) by mouth 2 (two) times daily with a meal. 60 tablet 2 11/09/2017 at Unknown time  . pantoprazole (PROTONIX) 40 MG tablet Take 1 tablet (40 mg total) by mouth daily. 30 tablet 3 11/09/2017 at Unknown time  . prenatal vitamin w/FE, FA (PRENATAL 1 + 1) 27-1 MG TABS tablet Take 1 tablet by mouth daily before breakfast. 30 each 11 11/09/2017 at Unknown time  . Probiotic Product (PROBIOTIC DAILY PO) Take 1 tablet by mouth daily.    11/09/2017 at Unknown time  . Vitamin D, Ergocalciferol, (DRISDOL) 50000 units CAPS capsule Take 1 capsule (50,000 Units total) by mouth every 7 (seven) days. 30 capsule 2 11/09/2017 at Unknown time  . ACCU-CHEK FASTCLIX LANCETS MISC 1 Units by Percutaneous route 4 (four) times daily. 100 each 12 10/29/2017 at Unknown time  . aspirin 81 MG chewable tablet Chew 1 tablet (81 mg total) by mouth daily. 30 tablet 12 11/07/2017  . Blood Glucose Monitoring Suppl (ACCU-CHEK NANO SMARTVIEW) w/Device KIT 1 kit by Subdermal route as directed. Check blood sugars for fasting, and two hours after breakfast, lunch and dinner (4 checks daily) 1 kit 0 10/29/2017 at Unknown time  . Elastic Bandages & Supports (COMFORT FIT MATERNITY SUPP SM) MISC Wear as directed. 1 each 0 Taking  . glucose blood (ACCU-CHEK SMARTVIEW) test strip Use as instructed to check blood sugars 100 each  12 10/29/2017 at Unknown time  . glucose blood test strip Use as instructed 100 each 12 Taking  . terconazole (TERAZOL 3) 0.8 % vaginal cream Place 1 applicator vaginally at bedtime. (Patient not taking: Reported on 11/06/2017) 20 g 0 Not Taking     Allergies: has No Known Allergies. OBHx:  OB History  Gravida Para Term Preterm AB Living  '5 2 2 '$ 0 2 2  SAB TAB Ectopic Multiple Live Births  2 0 0 0 2    # Outcome Date GA Lbr Len/2nd Weight Sex Delivery Anes PTL Lv  5 Current           4 SAB 08/09/15 [redacted]w[redacted]d        3 Term 03/23/05 [id number]  g F Vag-Spont   LIV     Complications: IUGR (intrauterine growth restriction) affecting care of mother  2 SAB 2004          1 Term 08/06/96   3175 g M Vag-Spont   LIV              FHx:  Family History  Problem Relation Age of Onset  . Hypertension Mother   . Diabetes Maternal Aunt   . Diabetes Maternal Uncle    Soc Hx:  Social History   Socioeconomic History  . Marital status: Married    Spouse name: Not on file  . Number of children: Not on file  . Years of education: Not on file  . Highest education level: Not on file  Occupational History  . Not on file  Social Needs  . Financial resource strain: Not on file  . Food insecurity:    Worry: Not on file    Inability: Not on file  . Transportation needs:    Medical: Not on file    Non-medical: Not on file  Tobacco Use  . Smoking status: Current Some Day Smoker    Packs/day: 0.25    Types: Cigarettes  . Smokeless tobacco: Never Used  . Tobacco comment: 2 cig/day  Substance and Sexual Activity  . Alcohol use: No    Alcohol/week: 0.0 standard drinks  . Drug use: No  . Sexual activity: Yes  Lifestyle  . Physical activity:    Days per week: Not on file    Minutes per session: Not on file  . Stress: Not on file  Relationships  . Social connections:    Talks on phone: Not on file    Gets together: Not on file    Attends religious service: Not on file    Active member of club or organization: Not on file    Attends meetings of clubs or organizations: Not on file    Relationship status: Not on file  . Intimate partner violence:    Fear of current or ex partner: Not on file    Emotionally abused: Not on file    Physically abused: Not on file    Forced sexual activity: Not on file  Other Topics Concern  . Not on file  Social History Narrative  . Not on file    Objective    Current Vital Signs 24h Vital Sign Ranges  T 98.3 F (36.8 C) Temp  Avg: 98.4 F (36.9 C)  Min: 98.3 F (36.8 C)  Max: 98.5 F (36.9 C)   BP 128/73 BP  Min: 128/73  Max: 137/80  HR 66 Pulse  Avg: 71.3  Min: 63  Max: 85  RR 20 Resp  Avg: 19  Min: 18  Max: 20  SaO2 100 % Room Air SpO2  Avg: 100 %  Min: 100 %  Max: 100 %       24 Hour I/O Current Shift I/O  Time Ins Outs No intake/output data recorded. No intake/output data recorded.   EFM: 140 baseline, +accels, no decel, mod variability  Toco: rare UCs  General: Well nourished, well developed female in no acute distress.  Skin:  Warm and dry.  Cardiovascular: S1, S2 normal, no murmur, rub or gallop, regular rate and rhythm Respiratory:  Clear to auscultation bilateral. Normal respiratory effort Abdomen: gravid, nttp Neuro/Psych:  Normal mood and affect.   In MAU by CNM Hogan: no pooling noted, cervix visually closed.   Labs  Recent Labs  Lab 11/09/17 1300  WBC 8.1  HGB 11.4*  HCT 35.0*  PLT 271    Radiology 3/50: cephalic, AFI 7.57 with a 2.95cm pocket, bpp 8/8 8/2: efw 75%, 1865gm, AC 90%, AFI 8.7  Assessment & Plan   42 y.o. [id number] @ 43w6dwith oligo, negative for PPROM; pt stable *Pregnancy: category I tracing with accels, fetal status reassuring. qshift NSTs *Oligo: will repeat u/s with bpp and growth on 8/25 (ordered). Will IVF hydrate patient. BMZ today and tomorrow *GDMa2: continue metformin, am fasting and 2hr post prandial. Will put in SSI since getting BMZ *cHTN: continue low dose asa *Preterm: no current issues. If for delivery, will d/w NICU *Analgesia: no current needs *PPx: SCDs, OOB ad libt.   CDurene RomansMD Attending Center for [facility]

## 2017-11-10 LAB — GLUCOSE, CAPILLARY
GLUCOSE-CAPILLARY: 123 mg/dL — AB (ref 70–99)
GLUCOSE-CAPILLARY: 91 mg/dL (ref 70–99)
Glucose-Capillary: 81 mg/dL (ref 70–99)
Glucose-Capillary: 109 mg/dL — ABNORMAL HIGH (ref 70–99)
Glucose-Capillary: 141 mg/dL — ABNORMAL HIGH (ref 70–99)

## 2017-11-10 LAB — HEMOGLOBIN A1C
HEMOGLOBIN A1C: 5.1 % (ref 4.8–5.6)
MEAN PLASMA GLUCOSE: 99.67 mg/dL

## 2017-11-10 NOTE — Progress Notes (Signed)
Patient states she is feeling better.  I will remove her from the monitor at this time.

## 2017-11-10 NOTE — Progress Notes (Addendum)
Daily Antepartum Note  Admission Date: 11/09/2017 Current Date: 11/10/2017 9:16 AM  Tracey [name] is a 42 y.o. [id number] @ 6155w0d, HD#2, admitted for oligo, ?PPROM.  Pregnancy complicated by: Patient Active Problem List   Diagnosis Date Noted  . Oligohydramnios antepartum 11/09/2017  . GDM (gestational diabetes mellitus) 09/28/2017  . Carpal tunnel syndrome during pregnancy 09/06/2017  . Heartburn during pregnancy, antepartum 09/06/2017  . AMA (advanced maternal age) multigravida 35+ 06/11/2017  . Vitamin D deficiency 06/07/2017  . Supervision of high risk pregnancy, antepartum 06/04/2017  . History of prior pregnancy with IUGR newborn 06/04/2017  . Chronic hypertension during pregnancy, antepartum 02/09/2016  . Anxiety, generalized 02/09/2016    Overnight/24hr events:  none  Subjective:  No LOF, VB or vaginal discharge. Nothing on underwear and not needing to wear a pad  Objective:     Current Vital Signs 24h Vital Sign Ranges  T 97.8 F (36.6 C) Temp  Avg: 98.2 F (36.8 C)  Min: 97.8 F (36.6 C)  Max: 98.8 F (37.1 C)  BP 117/68 BP  Min: 116/59  Max: 137/80  HR 74 Pulse  Avg: 70.3  Min: 57  Max: 85  RR 16 Resp  Avg: 17.8  Min: 16  Max: 20  SaO2 100 % Room Air SpO2  Avg: 99.6 %  Min: 98 %  Max: 100 %       24 Hour I/O Current Shift I/O  Time Ins Outs No intake/output data recorded. No intake/output data recorded.   Patient Vitals for the past 24 hrs:  BP Temp Temp src Pulse Resp SpO2 Height Weight  11/10/17 0827 117/68 97.8 F (36.6 C) Oral 74 16 100 % - -  11/10/17 0532 (!) 116/59 97.9 F (36.6 C) Oral (!) 57 17 100 % - -  11/09/17 1937 126/61 98.8 F (37.1 C) Oral 68 18 100 % - -  11/09/17 1708 133/71 98.1 F (36.7 C) Oral 79 18 98 % - -  11/09/17 1400 128/73 98.3 F (36.8 C) Oral 66 20 100 % - -  11/09/17 1347 - - - - - - 5\' 7"  (1.702 m) 93.9 kg  11/09/17 1103 137/80 98.5 F (36.9 C) Oral 63 18 - - -   EFM: 135 baseline, +accels, no decel, mod  variability Toco: one or two UCs  Physical exam: General: Well nourished, well developed female in no acute distress. Abdomen: gravid nttp Cardiovascular: S1, S2 normal, no murmur, rub or gallop, regular rate and rhythm Respiratory: CTAB Extremities: no clubbing, cyanosis or edema Skin: Warm and dry.   Medications: Current Facility-Administered Medications  Medication Dose Route Frequency Provider Last Rate Last Dose  . acetaminophen (TYLENOL) tablet 650 mg  650 mg Oral Q4H PRN Armando ReichertHogan, Heather D, CNM      . acidophilus (RISAQUAD) capsule 1 capsule  1 capsule Oral Daily [place] BingPickens, Riti Rollyson, MD      . aspirin chewable tablet 81 mg  81 mg Oral Daily Armando ReichertHogan, Heather D, CNM   81 mg at 11/09/17 1706  . betamethasone acetate-betamethasone sodium phosphate (CELESTONE) injection 12 mg  12 mg Intramuscular Q24H Thressa ShellerHogan, Heather D, CNM   12 mg at 11/09/17 1320  . calcium carbonate (TUMS - dosed in mg elemental calcium) chewable tablet 400 mg of elemental calcium  2 tablet Oral Q4H PRN Armando ReichertHogan, Heather D, CNM      . docusate sodium (COLACE) capsule 100 mg  100 mg Oral BID PRN [place] BingPickens, Jameria Bradway, MD      . insulin  aspart (novoLOG) injection 0-14 Units  0-14 Units Subcutaneous QID [place] Bing, MD      . lactated ringers bolus 1,000 mL  1,000 mL Intravenous Once Armando Reichert, CNM      . metFORMIN (GLUCOPHAGE) tablet 500 mg  500 mg Oral BID WC Thressa Sheller D, CNM   500 mg at 11/09/17 1851  . pantoprazole (PROTONIX) EC tablet 40 mg  40 mg Oral Daily Armando Reichert, CNM      . prenatal multivitamin tablet 1 tablet  1 tablet Oral Q1200 Armando Reichert, CNM      . zolpidem (AMBIEN) tablet 5 mg  5 mg Oral QHS PRN Armando Reichert, CNM        Labs:  Recent Labs  Lab 11/09/17 1300  WBC 8.1  HGB 11.4*  HCT 35.0*  PLT 271   B POS   Results for XARIA, JUDON (MRN [id number]) as of 11/10/2017 09:20  Ref. Range 11/09/2017 16:32 11/09/2017 18:50 11/09/2017 20:45 11/09/2017 22:25 11/10/2017 06:03   Glucose-Capillary Latest Ref Range: 70 - 99 mg/dL 725 (H) 98 366 (H) 440 (H)    A1c 5.1  Radiology No new imaging  Assessment & Plan:  Pt doing well *Pregnancy: routine care. Bid NSTs. Reactive NST *Oligo: less likely PPROM. Continue with IVF hydration and then d/c later today. Has repeat BPP tomorrow 8/23: cephalic, AFI 3.05 with a 2.95cm pocket, bpp 8/8 8/2: efw 75%, 1865gm, AC 90%, AFI 8.7 Admit fern and amnisure negative *Preterm: has BMZ #2 later today *GDMa2: doing well on her home metformin *PPx: OOB ad lib, SCDs *FEN/GI: will SLIV, DM diet *Dispo: possibly tomorrow pending u/s results.   Cornelia Copa MD Attending Center for [facility])

## 2017-11-10 NOTE — Progress Notes (Signed)
Patient reports having back pain and lower abdominal pain that comes and goes.  I will have her empty her bladder and I gave her a K-pad for her back I will keep her on the monitor a little longer.

## 2017-11-11 ENCOUNTER — Inpatient Hospital Stay (HOSPITAL_BASED_OUTPATIENT_CLINIC_OR_DEPARTMENT_OTHER): Payer: 59

## 2017-11-11 DIAGNOSIS — O09523 Supervision of elderly multigravida, third trimester: Secondary | ICD-10-CM

## 2017-11-11 DIAGNOSIS — O99213 Obesity complicating pregnancy, third trimester: Secondary | ICD-10-CM

## 2017-11-11 DIAGNOSIS — Z3A34 34 weeks gestation of pregnancy: Secondary | ICD-10-CM

## 2017-11-11 DIAGNOSIS — O24415 Gestational diabetes mellitus in pregnancy, controlled by oral hypoglycemic drugs: Secondary | ICD-10-CM

## 2017-11-11 DIAGNOSIS — O09293 Supervision of pregnancy with other poor reproductive or obstetric history, third trimester: Secondary | ICD-10-CM

## 2017-11-11 LAB — GLUCOSE, CAPILLARY
GLUCOSE-CAPILLARY: 84 mg/dL (ref 70–99)
GLUCOSE-CAPILLARY: 77 mg/dL (ref 70–99)

## 2017-11-11 IMAGING — US US MFM OB FOLLOW-UP
1 series · 15 of 28 positions shown · non-contrast
Comparison: none

[Series 1: us mfm ob follow-up · 34 acquisitions, 15 frames shown]
[im 1/34]
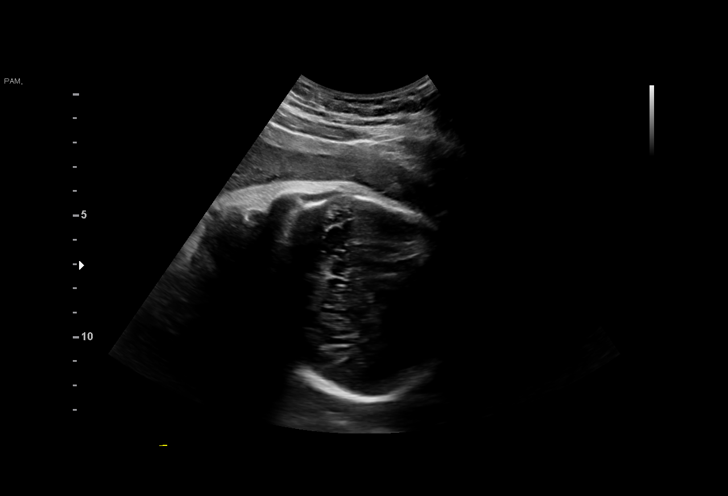
[im 3/34]
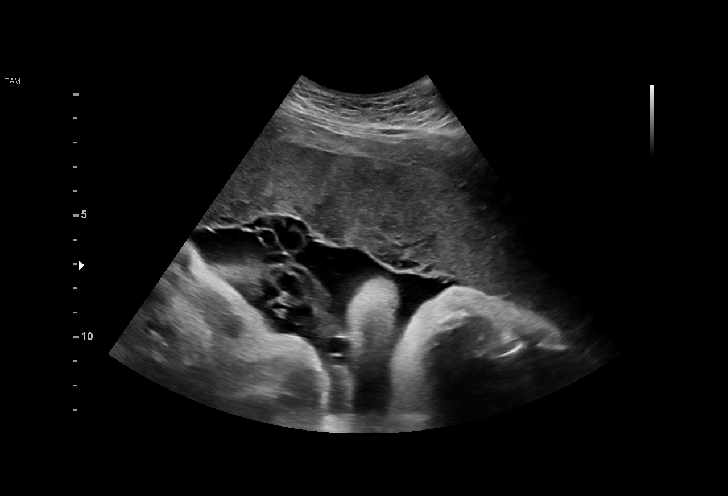
[im 5/34]
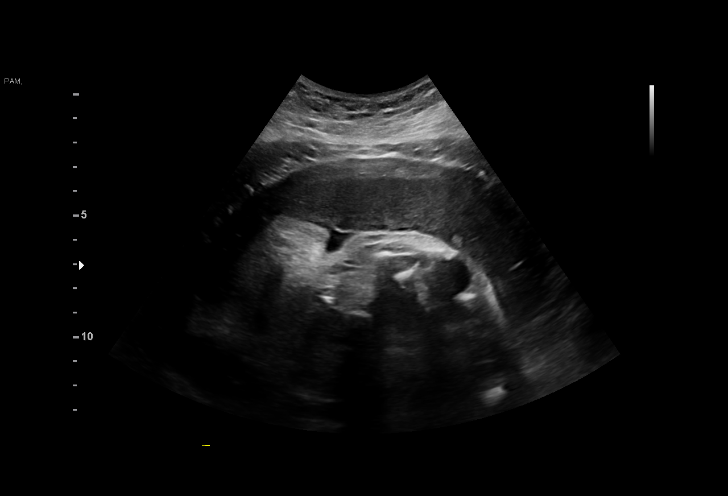
[im 8/34]
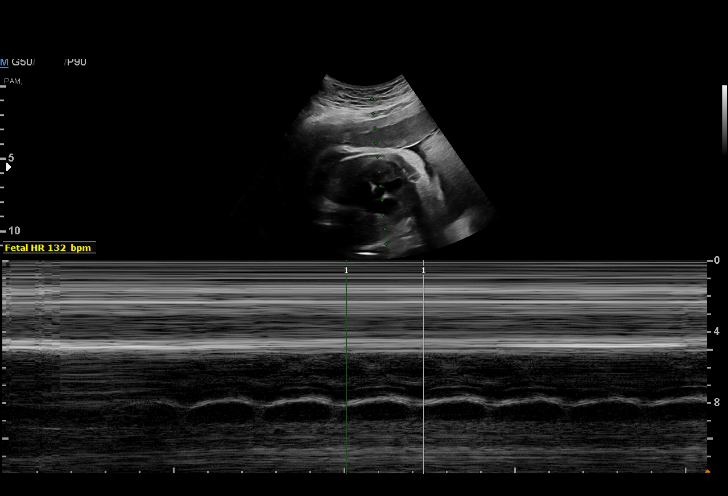
[im 10/34]
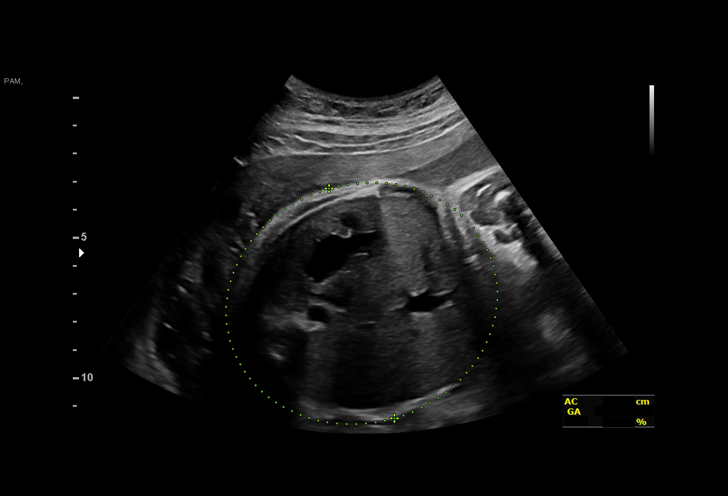
[im 13/34]
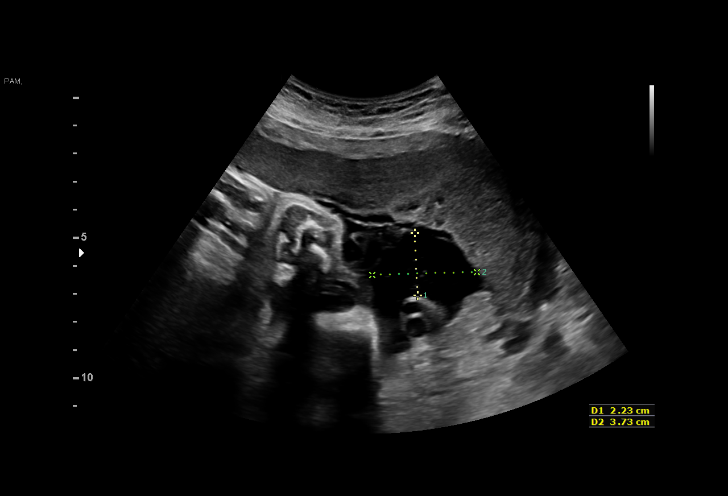
[im 15/34]
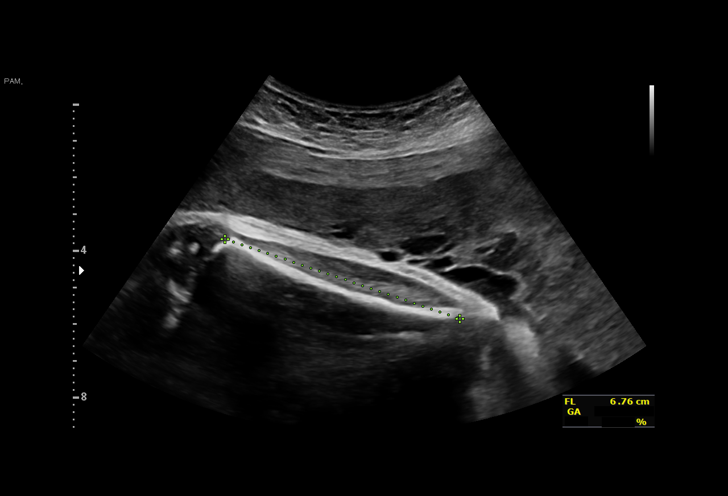
[im 18/34]
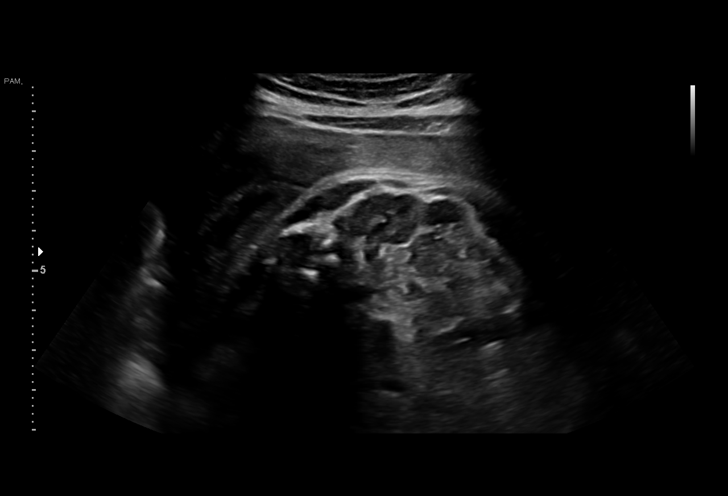
[im 19/34]
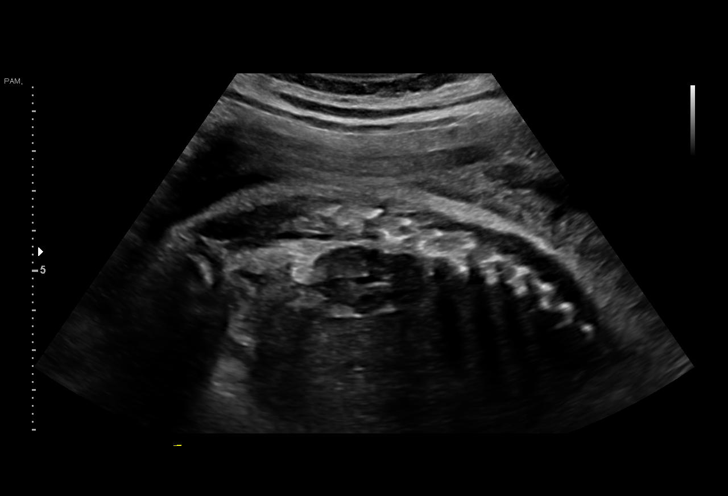
[im 21/34]
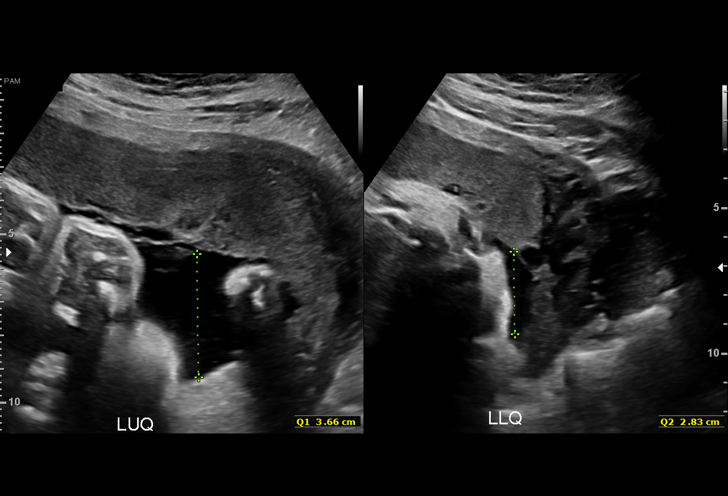
[im 24/34]
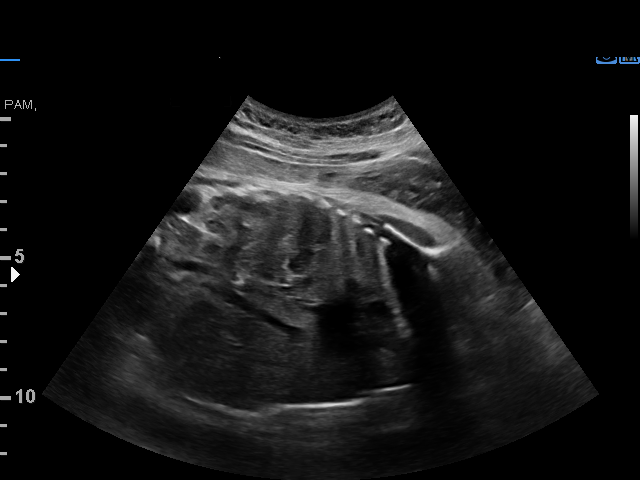
[im 26/34]
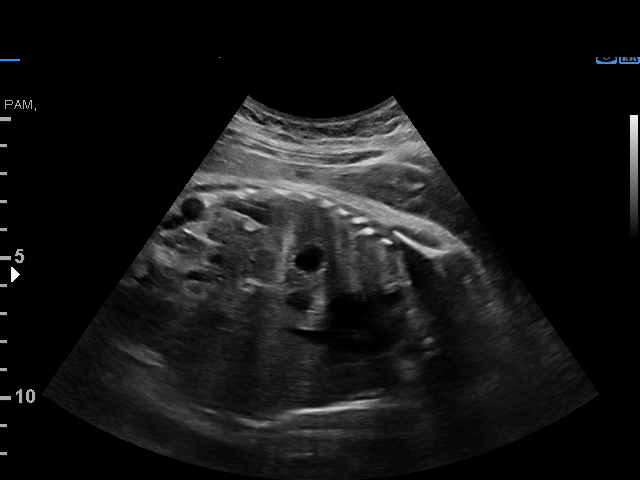
[im 29/34]
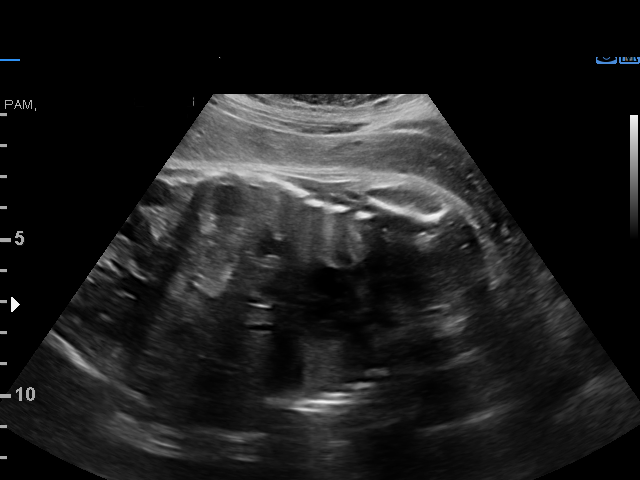
[im 31/34]
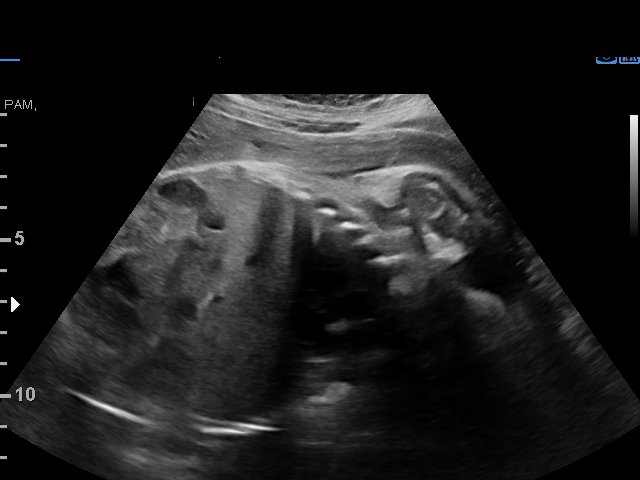
[im 34/34]
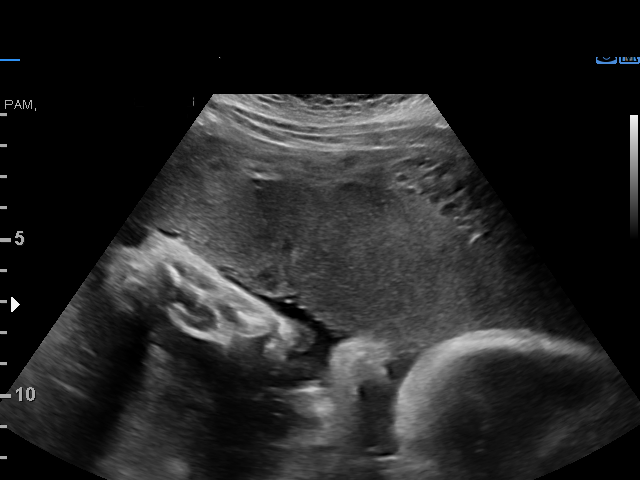

[15 of 28 positions shown; findings below may reference images not displayed]

Attending:        APPATEY      Secondary Phy.:   3rd Nursing- [facility]
OB

Indications

34 weeks gestation of pregnancy
Poor obstetric history: Previous fetal growth  [7K]
restriction (FGR)
Hypertension - Chronic/Pre-existing (ASA)      [7K]
Obesity complicating pregnancy, third          [7K]
trimester (pregravid BMI 33)
Advanced maternal age multigravida 39,         [7K]
third trimester (low risk NIPS)
Gestational diabetes in pregnancy,             [7K]
controlled by oral hypoglycemic drugs
Vital Signs

Height:        5'6"
Fetal Evaluation

Num Of Fetuses:         1
Fetal Heart Rate(bpm):  132
Cardiac Activity:       Observed
Presentation:           Cephalic
Placenta:               Anterior

Amniotic Fluid
AFI FV:      Subjectively low-normal

AFI Sum(cm)     %Tile       Largest Pocket(cm)
6.49            < 3
RUQ(cm)       RLQ(cm)       LUQ(cm)        LLQ(cm)
3.66          0             2.83           0
Biophysical Evaluation

Amniotic F.V:   Within normal limits       F. Tone:        Observed
F. Movement:    Observed                   Score:          [DATE]
F. Breathing:   Observed
Biometry

BPD:      81.5  mm     G. Age:  32w 5d         13  %    CI:        78.14   %    70 - 86
FL/HC:      22.8   %    19.4 -
HC:      291.7  mm     G. Age:  32w 1d        < 3  %    HC/AC:      1.02        0.96 -
AC:      286.7  mm     G. Age:  32w 5d         16  %    FL/BPD:     81.7   %    71 - 87
FL:       66.6  mm     G. Age:  34w 2d         44  %    FL/AC:      23.2   %    20 - 24

Est. FW:    [7K]  gm    4 lb 11 oz      39  %
OB History

Gravidity:    5         Term:   2        Prem:   0        SAB:   2
TOP:          0       Ectopic:  0        Living: 2
Gestational Age

LMP:           34w 1d        Date:  [DATE]                 EDD:   [DATE]
U/S Today:     33w 0d                                        EDD:   [DATE]
Best:          34w 1d     Det. By:  LMP  ([DATE])          EDD:   [DATE]
Anatomy

Cranium:               Appears normal         Kidneys:                Appear normal
Stomach:               Appears normal, left   Bladder:                Appears normal
sided
Comments

U/S images reviewed.  Appropriate interval growth is noted.
No fetal abnormalities are seen.   No evidence of fetal
compromise is found on BPP.
Recommendations: 1) Serial U/S every 4 weeks for fetal
growth 2) Close A-P surveillance
Recommendations

1) Serial U/S every 4 weeks for fetal growth 2) Close A-P
surveillance

## 2017-11-11 NOTE — Discharge Instructions (Signed)
Oligohydramnios Oligohydramnios is a conditionin which there is not enough fluid in the sac (amniotic sac) that surrounds your unborn baby (fetus). The amniotic sac in the uterus contains fluid (amniotic fluid) that:  Protects your baby from injury (trauma) and infections.  Helps your baby move freely inside the uterus.  Helps your baby's lungs, kidneys, and digestive system to develop.  This condition occurs before birth (is a prenatal condition), and it can interfere with your baby's normal prenatal growth and development. Oligohydramnios can happen any time during pregnancy, and it most commonly occurs during the last 3 months (third trimester). It is most likely to cause serious complications when it occurs early in pregnancy. Some possible complications of this condition include:  Early (premature) birth.  Birth defects.  Limited (restricted) fetal growth.  Decreased oxygen flow to the fetus due to pressure on the umbilical cord.  Pregnancy loss (miscarriage).  Stillbirth.  What are the causes? This condition may be caused by:  A leak or tear in the amniotic sac.  A problem with the organ that nourishes the baby in the uterus (placenta), such as failure of the placenta to provide enough blood, fluid, and nutrients to the baby.  Having identical twins who share the same placenta.  A fetal birth defect. This is usually an abnormality in the fetal kidneys or urinary tract.  A pregnancy that goes past the due date.  A condition that the mother has, such as: ? A lack of fluids in the body (dehydration). ? High blood pressure. ? Diabetes. ? Reactions to certain medicines, such as ibuprofen or blood pressure medicines (ACE inhibitors). ? Systemic lupus.  In some cases, the cause is unknown. What increases the risk? This condition is more likely to develop if you:  Become dehydrated.  Have high blood pressure.  Take NSAIDs or ACE inhibitors.  Have diabetes or  lupus.  Have poor prenatal care.  What are the signs or symptoms? In most cases, there are no symptoms of oligohydramnios. If you do have symptoms, they may include:  Fluid leaking from the vagina.  Having a uterus that is smaller than normal.  Feeling less movement of your baby in your uterus.  How is this diagnosed? This condition may be diagnosed by measuring the amount of amniotic fluid in your amniotic sac using the amniotic fluid index (AFI). The AFI is a prenatal ultrasound test that uses painless, harmless sound waves to create an image of your uterus and your baby. An AFI prenatal ultrasound test may be done:  To measure the amniotic fluid level.  To check your baby's kidneys and your baby's growth.  To evaluate the placenta.  You may also have other tests to find the cause of oligohydramnios. How is this treated? Treatment for this condition depends on how low your amniotic fluid level is, how far along you are in your pregnancy, and your overall health. Treatment may include:  Having your health care provider monitor your condition more closely than usual. You may have more frequent appointments and more AFI ultrasound measurements.  Increasing the amount of fluid in your body. This may be done by having you drink more fluids, or by giving you fluids through an IV tube that is inserted into one of your veins.  Injecting fluid into your amniotic sac during delivery (amnioinfusion).  Having your baby delivered early, if you are close to your due date.  Follow these instructions at home: Lifestyle  Do not drink alcohol. No safe  level of alcohol consumption during pregnancy has been determined.  Do not use any tobacco products, such as cigarettes, chewing tobacco, and e-cigarettes. If you need help quitting, ask your health care provider.  Do not use any illegal drugs. These can harm your developing baby or cause a miscarriage. General instructions  Take  over-the-counter and prescription medicines only as told by your health care provider.  Follow instructions from your health care provider about physical activity and rest. Your health care provider may recommend that you stay in bed (be on bed rest).  Follow instructions from your health care provider about eating or drinking restrictions.  Eat healthy foods.  Drink enough fluids to keep your urine clear or pale yellow.  Keep all prenatal care appointments with your health care provider. This is important. Contact a health care provider if:  You notice that your baby seems to be moving less than usual. Get help right away if:  You have fluid leaking from your vagina.  You start to have labor pains (contractions). This may feel like a sense of tightening in your lower abdomen.  You have a fever. This information is not intended to replace advice given to you by your health care provider. Make sure you discuss any questions you have with your health care provider. Document Released: 06/21/2010 Document Revised: 08/09/2015 Document Reviewed: 09/16/2014 Elsevier Interactive Patient Education  [employer]2018 [employer]

## 2017-11-11 NOTE — Progress Notes (Signed)
Daily Antepartum Note  Admission Date: 11/09/2017 Current Date: 11/11/2017 7:32 AM  Tracey [name] is a 42 y.o. [id number] @ [redacted]w[redacted]d, HD#3, admitted for oligo, ?PPROM.  Pregnancy complicated by: Patient Active Problem List   Diagnosis Date Noted  . Oligohydramnios antepartum 11/09/2017  . GDM (gestational diabetes mellitus) 09/28/2017  . Carpal tunnel syndrome during pregnancy 09/06/2017  . Heartburn during pregnancy, antepartum 09/06/2017  . AMA (advanced maternal age) multigravida 35+ 06/11/2017  . Vitamin D deficiency 06/07/2017  . Supervision of high risk pregnancy, antepartum 06/04/2017  . History of prior pregnancy with IUGR newborn 06/04/2017  . Chronic hypertension during pregnancy, antepartum 02/09/2016  . Anxiety, generalized 02/09/2016    Overnight/24hr events:  none  Subjective:  Still no LOF, VB or vaginal discharge.   Objective:     Current Vital Signs 24h Vital Sign Ranges  T 97.7 F (36.5 C) Temp  Avg: 98 F (36.7 C)  Min: 97.7 F (36.5 C)  Max: 98.4 F (36.9 C)  BP (!) 100/54 BP  Min: 100/54  Max: 141/72  HR (!) 56 Pulse  Avg: 64  Min: 56  Max: 74  RR 17 Resp  Avg: 16.4  Min: 16  Max: 17  SaO2 100 % Room Air SpO2  Avg: 99.8 %  Min: 99 %  Max: 100 %       24 Hour I/O Current Shift I/O  Time Ins Outs No intake/output data recorded. No intake/output data recorded.   Patient Vitals for the past 24 hrs:  BP Temp Temp src Pulse Resp SpO2  11/11/17 0623 (!) 100/54 97.7 F (36.5 C) Oral (!) 56 17 100 %  11/10/17 1916 122/61 98.3 F (36.8 C) Oral 61 17 99 %  11/10/17 1555 (!) 141/72 98.4 F (36.9 C) Oral 67 16 100 %  11/10/17 1129 125/74 97.7 F (36.5 C) Oral 62 16 100 %  11/10/17 0827 117/68 97.8 F (36.6 C) Oral 74 16 100 %   EFM: 130 baseline, +accels, no decel, mod variability Toco: quiet  Physical exam: General: Well nourished, well developed female in no acute distress. Abdomen: gravid nttp Cardiovascular: S1, S2 normal, no murmur, rub or gallop,  regular rate and rhythm Respiratory: CTAB Extremities: no clubbing, cyanosis or edema Skin: Warm and dry.   Medications: Current Facility-Administered Medications  Medication Dose Route Frequency Provider Last Rate Last Dose  . acetaminophen (TYLENOL) tablet 650 mg  650 mg Oral Q4H PRN Armando Reichert, CNM   650 mg at 11/10/17 1634  . acidophilus (RISAQUAD) capsule 1 capsule  1 capsule Oral Daily Birdsong Bing, MD   1 capsule at 11/10/17 0923  . aspirin chewable tablet 81 mg  81 mg Oral Daily Armando Reichert, CNM   81 mg at 11/10/17 4540  . calcium carbonate (TUMS - dosed in mg elemental calcium) chewable tablet 400 mg of elemental calcium  2 tablet Oral Q4H PRN Armando Reichert, CNM      . docusate sodium (COLACE) capsule 100 mg  100 mg Oral BID PRN Tasley Bing, MD      . insulin aspart (novoLOG) injection 0-14 Units  0-14 Units Subcutaneous QID [place] Bing, MD      . lactated ringers bolus 1,000 mL  1,000 mL Intravenous Once Armando Reichert, CNM      . metFORMIN (GLUCOPHAGE) tablet 500 mg  500 mg Oral BID WC Thressa Sheller D, CNM   500 mg at 11/10/17 1952  . pantoprazole (PROTONIX) EC tablet 40 mg  40 mg Oral Daily Armando ReichertHogan, Heather D, CNM   40 mg at 11/10/17 [id number]  . prenatal multivitamin tablet 1 tablet  1 tablet Oral Q1200 Thressa ShellerHogan, Heather D, CNM   1 tablet at 11/10/17 1204  . zolpidem (AMBIEN) tablet 5 mg  5 mg Oral QHS PRN Armando ReichertHogan, Heather D, CNM        Labs:  Results for Gaspar BiddingROPST, [place]ley (MRN [id number]) as of 11/11/2017 09:00  Ref. Range 11/10/2017 11:25 11/10/2017 15:50 11/10/2017 19:58 11/10/2017 22:08 11/11/2017 08:24  Glucose-Capillary Latest Ref Range: 70 - 99 mg/dL 81 119109 (H) 91 147141 (H) 84    Radiology No new imaging  Assessment & Plan:  Pt doing well *Pregnancy: routine care. Bid NSTs. Reactive NST *Oligo: less likely PPROM. For rpt bpp today.  8/23: cephalic, AFI 3.05 with a 2.95cm pocket, bpp 8/8 8/2: efw 75%, 1865gm, AC 90%, AFI 8.7 Admit fern and amnisure  negative *Preterm: s/p bmz 8/24-25 *GDMa2: doing well on her home metformin *PPx: OOB ad lib, SCDs *FEN/GI: will SLIV, DM diet *Dispo: possibly today but pending u/s results.   Cornelia Copaharlie Dannia Snook, Jr. MD Attending [facility](Faculty Practice)

## 2017-11-11 NOTE — Discharge Summary (Signed)
Physician Discharge Summary  Patient ID: Tracey [name] MRN: [id number] DOB/AGE: 40/11/1975 40 y.o.  Admit date: 11/09/2017 Discharge date: 11/11/2017  Admission Diagnoses: CHTN, Type 2 diabetes, AMA, oligohydramnios Discharge Diagnoses:  Active Problems:   Oligohydramnios antepartum   Discharged Condition: stable  Hospital Course: admitted for obs due to low AFI, 3.95 cm, with 1 pocket baRELY 2 CM REPEATED IN 48 HOURS NOW 6.94.  bppS 8/8 with reactive NSTs  Short interval follow up in 2 days  Consults: MFM  Significant Diagnostic Studies: serial NST and scans  Treatments: observation  Discharge Exam: Blood pressure 136/68, pulse 68, temperature 98.7 F (37.1 C), temperature source Oral, resp. rate 16, height 5' 7" (1.702 m), weight 93.9 kg, last menstrual period 03/17/2017, SpO2 100 %, unknown if currently breastfeeding. General appearance: alert, cooperative and no distress GI: soft, non-tender; bowel sounds normal; no masses,  no organomegaly  Disposition: Discharge disposition: 01-Home or Self Care       Discharge Instructions    Call MD for:  persistant nausea and vomiting   Complete by:  As directed    Call MD for:  severe uncontrolled pain   Complete by:  As directed    Call MD for:  temperature >100.4   Complete by:  As directed    Diet - low sodium heart healthy   Complete by:  As directed    Increase activity slowly   Complete by:  As directed      Allergies as of 11/11/2017   No Known Allergies     Medication List    TAKE these medications   ACCU-CHEK FASTCLIX LANCETS Misc 1 Units by Percutaneous route 4 (four) times daily.   ACCU-CHEK NANO SMARTVIEW w/Device Kit 1 kit by Subdermal route as directed. Check blood sugars for fasting, and two hours after breakfast, lunch and dinner (4 checks daily)   aspirin 81 MG chewable tablet Chew 1 tablet (81 mg total) by mouth daily.   COMFORT FIT MATERNITY SUPP SM Misc Wear as directed.   glucose  blood test strip Use as instructed to check blood sugars   glucose blood test strip Use as instructed   metFORMIN 500 MG tablet Commonly known as:  GLUCOPHAGE Take 1 tablet (500 mg total) by mouth 2 (two) times daily with a meal.   pantoprazole 40 MG tablet Commonly known as:  PROTONIX Take 1 tablet (40 mg total) by mouth daily.   prenatal vitamin w/FE, FA 27-1 MG Tabs tablet Take 1 tablet by mouth daily before breakfast.   PROBIOTIC DAILY PO Take 1 tablet by mouth daily.   terconazole 0.8 % vaginal cream Commonly known as:  TERAZOL 3 Place 1 applicator vaginally at bedtime.   Vitamin D (Ergocalciferol) 50000 units Caps capsule Commonly known as:  DRISDOL Take 1 capsule (50,000 Units total) by mouth every 7 (seven) days.      Follow-up Information    [facility] Follow up on 11/13/2017.   Why:  AFI, NST or BPP with AFI Contact information: [address] [place] [address] [phone]          Signed:  H  11/11/2017, 1:36 PM  

## 2017-11-12 ENCOUNTER — Other Ambulatory Visit (HOSPITAL_COMMUNITY): Payer: 59

## 2017-11-12 LAB — GC/CHLAMYDIA PROBE AMP (~~LOC~~) NOT AT ARMC
CHLAMYDIA, DNA PROBE: NEGATIVE
Neisseria Gonorrhea: NEGATIVE

## 2017-11-13 ENCOUNTER — Encounter (HOSPITAL_COMMUNITY): Payer: Self-pay

## 2017-11-13 ENCOUNTER — Ambulatory Visit (HOSPITAL_COMMUNITY)
Admission: RE | Admit: 2017-11-13 | Discharge: 2017-11-13 | Disposition: A | Discharge status: Home / Self Care | Payer: 59 | Source: Ambulatory Visit | Attending: Obstetrics and Gynecology | Admitting: Obstetrics and Gynecology

## 2017-11-13 ENCOUNTER — Ambulatory Visit (INDEPENDENT_AMBULATORY_CARE_PROVIDER_SITE_OTHER): Payer: 59 | Admitting: Advanced Practice Midwife

## 2017-11-13 VITALS — BP 131/83 | HR 81 | Wt 213.9 lb

## 2017-11-13 DIAGNOSIS — O24913 Unspecified diabetes mellitus in pregnancy, third trimester: Secondary | ICD-10-CM

## 2017-11-13 DIAGNOSIS — E669 Obesity, unspecified: Secondary | ICD-10-CM | POA: Diagnosis not present

## 2017-11-13 DIAGNOSIS — O09293 Supervision of pregnancy with other poor reproductive or obstetric history, third trimester: Secondary | ICD-10-CM | POA: Insufficient documentation

## 2017-11-13 DIAGNOSIS — O24415 Gestational diabetes mellitus in pregnancy, controlled by oral hypoglycemic drugs: Secondary | ICD-10-CM | POA: Diagnosis not present

## 2017-11-13 DIAGNOSIS — Z113 Encounter for screening for infections with a predominantly sexual mode of transmission: Secondary | ICD-10-CM

## 2017-11-13 DIAGNOSIS — O4103X0 Oligohydramnios, third trimester, not applicable or unspecified: Secondary | ICD-10-CM

## 2017-11-13 DIAGNOSIS — Z3A34 34 weeks gestation of pregnancy: Secondary | ICD-10-CM

## 2017-11-13 DIAGNOSIS — O99213 Obesity complicating pregnancy, third trimester: Secondary | ICD-10-CM | POA: Diagnosis not present

## 2017-11-13 DIAGNOSIS — O09523 Supervision of elderly multigravida, third trimester: Secondary | ICD-10-CM | POA: Insufficient documentation

## 2017-11-13 DIAGNOSIS — O288 Other abnormal findings on antenatal screening of mother: Secondary | ICD-10-CM

## 2017-11-13 DIAGNOSIS — O10013 Pre-existing essential hypertension complicating pregnancy, third trimester: Secondary | ICD-10-CM | POA: Insufficient documentation

## 2017-11-13 DIAGNOSIS — O0990 Supervision of high risk pregnancy, unspecified, unspecified trimester: Secondary | ICD-10-CM

## 2017-11-13 DIAGNOSIS — O10919 Unspecified pre-existing hypertension complicating pregnancy, unspecified trimester: Secondary | ICD-10-CM

## 2017-11-13 IMAGING — US US MFM FETAL BPP W/O NON-STRESS
1 series · 15 of 26 positions shown · non-contrast
Comparison: none

[Series 1: us mfm fetal bpp w/o non-stress · 26 acquisitions, 15 frames shown]
[im 1/26]
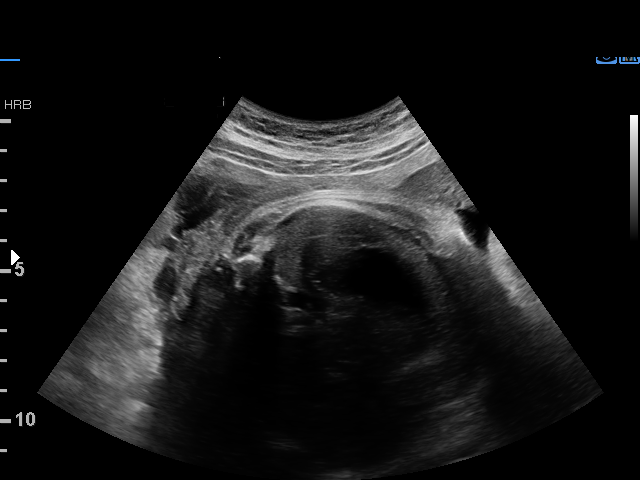
[im 3/26]
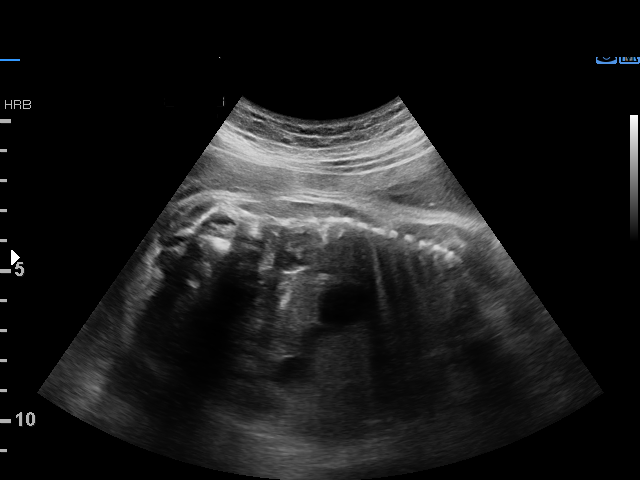
[im 5/26]
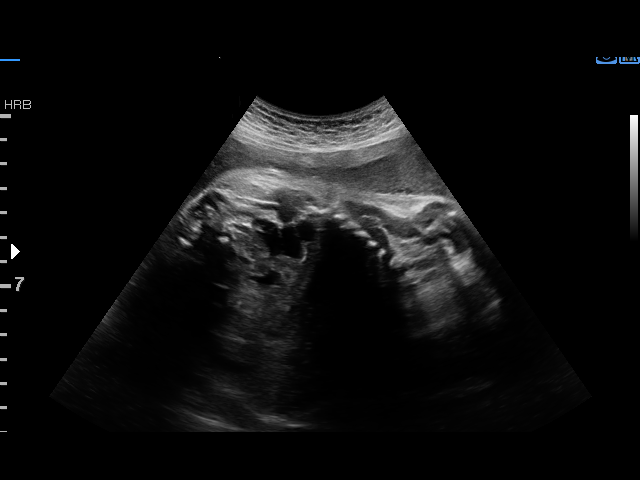
[im 7/26]
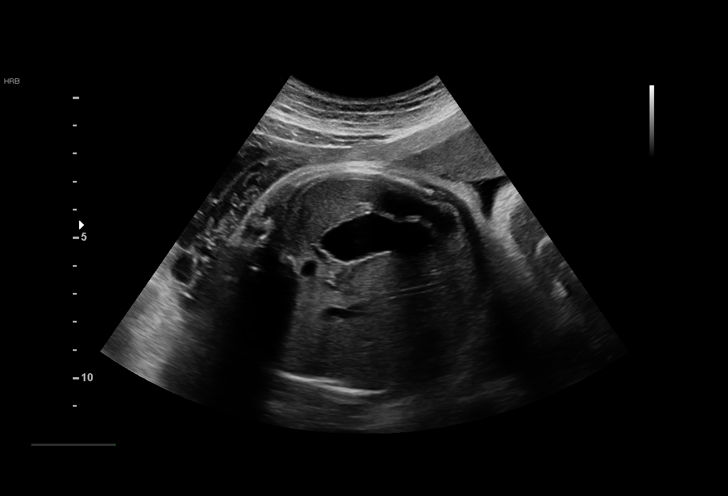
[im 8/26]
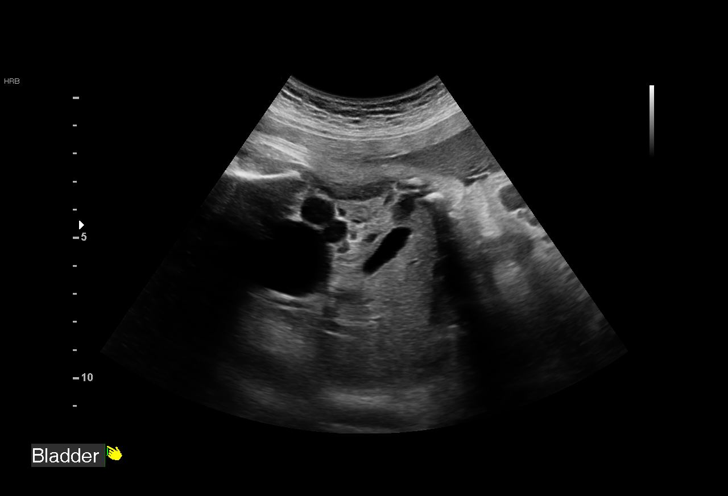
[im 10/26]
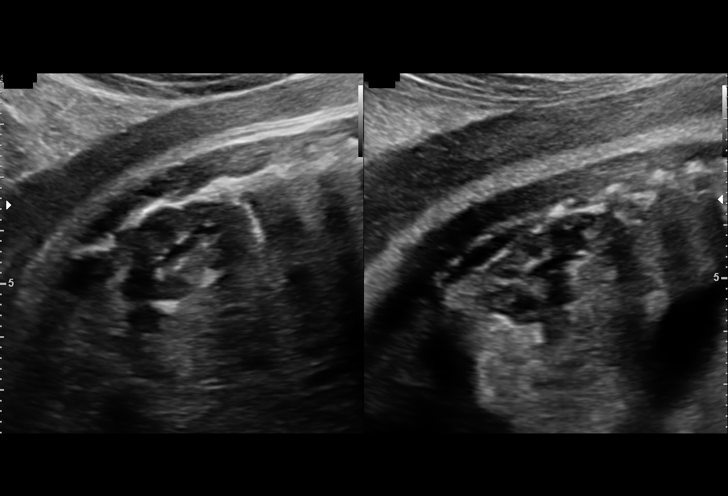
[im 12/26]
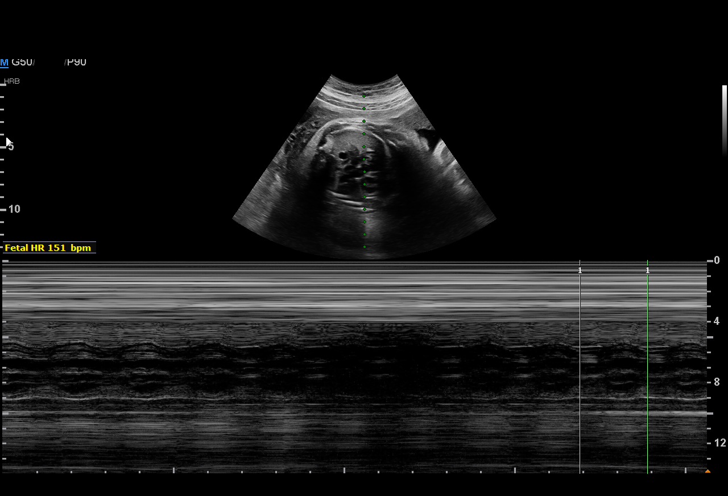
[im 14/26]
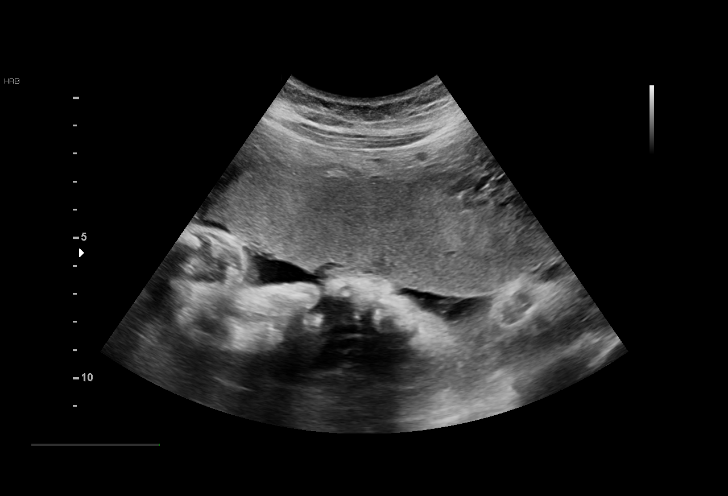
[im 15/26]
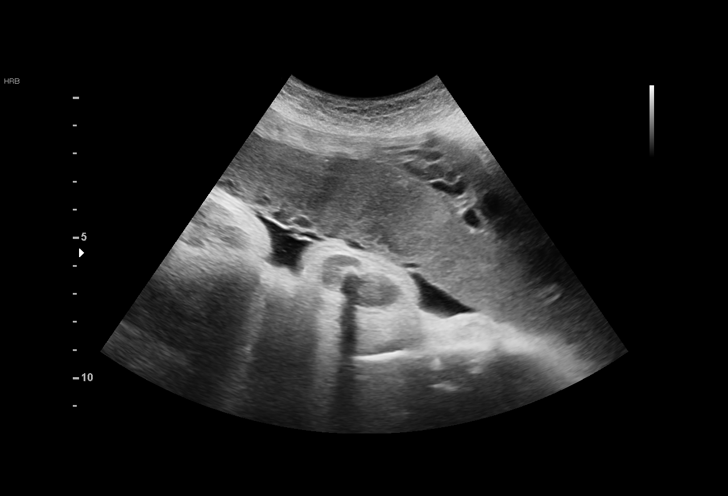
[im 17/26]
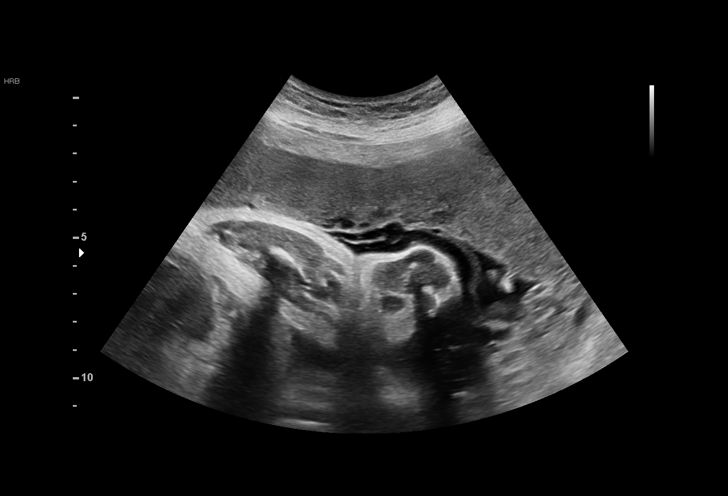
[im 19/26]
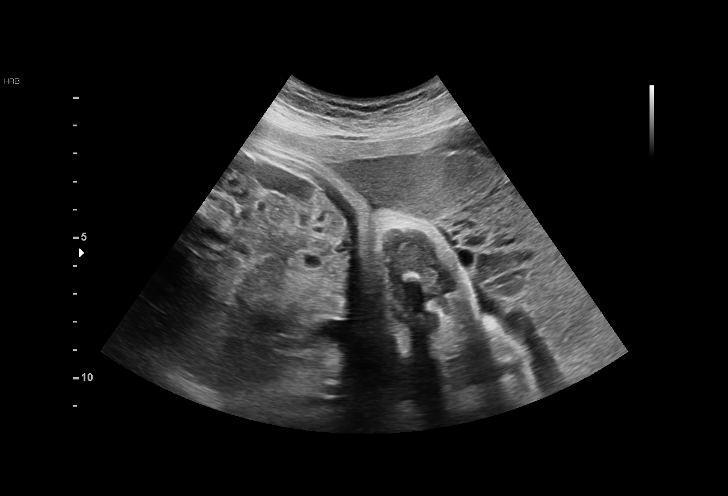
[im 20/26]
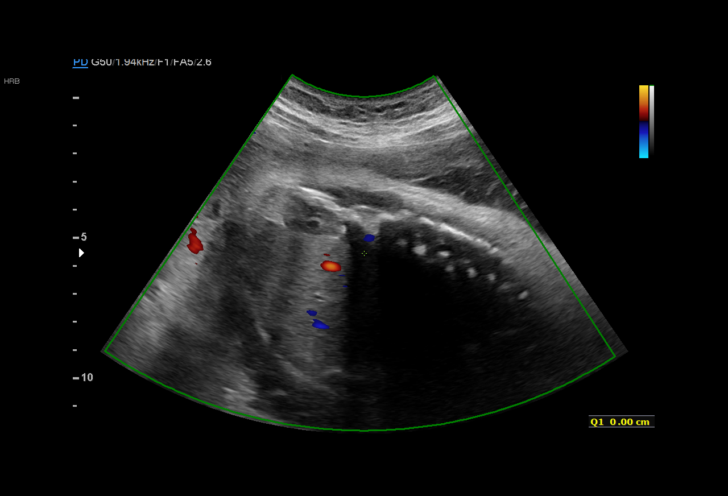
[im 22/26]
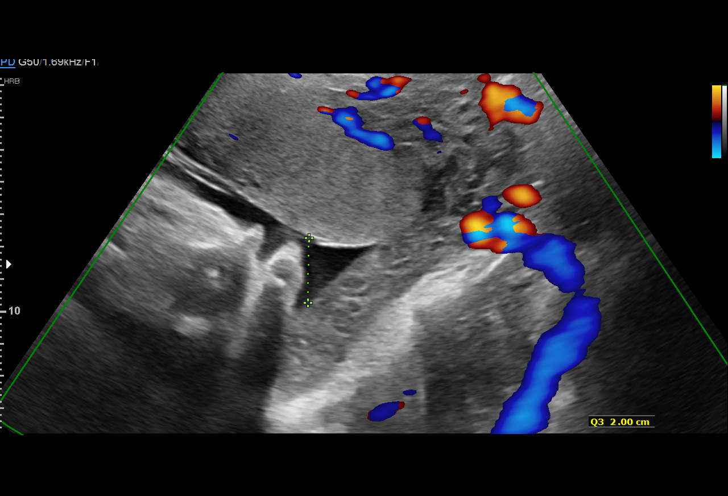
[im 24/26]
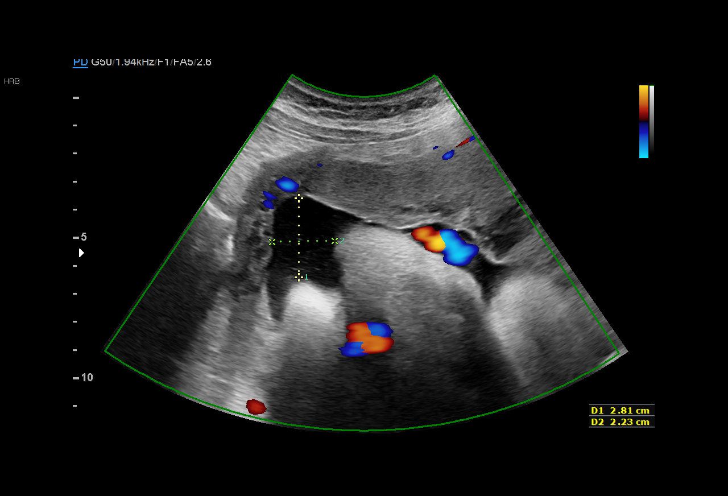
[im 26/26]
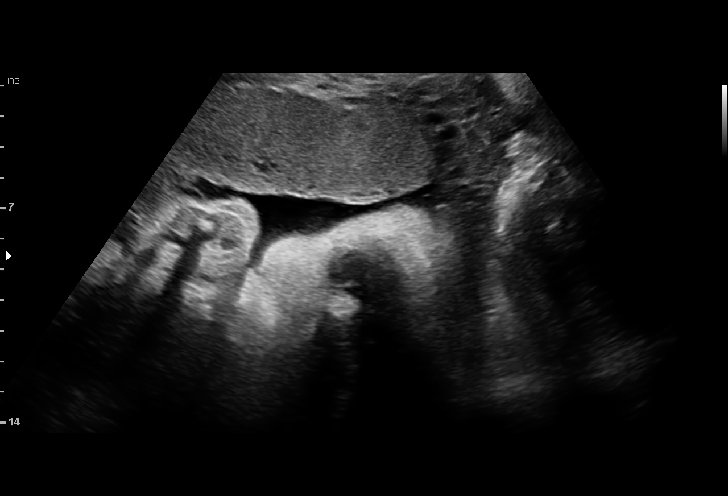

[15 of 26 positions shown; findings below may reference images not displayed]

Attending:        Anjuli Bruhn        Secondary Phy.:   3rd Nursing- [employer]
OB

Indications

Poor obstetric history: Previous fetal growth
restriction (FGR)
Hypertension - Chronic/Pre-existing (ASA)
Obesity complicating pregnancy, third
trimester (pregravid BMI 33)
34 weeks gestation of pregnancy
Advanced maternal age multigravida 39,
third trimester (low risk NIPS)
Gestational diabetes in pregnancy,
controlled by oral hypoglycemic drugs
Vital Signs

BMI:
Fetal Evaluation

Num Of Fetuses:         1
Fetal Heart Rate(bpm):  151
Cardiac Activity:       Observed
Presentation:           Cephalic
Placenta:               Anterior

Amniotic Fluid
AFI FV:      Subjectively decreased

AFI Sum(cm)     %Tile       Largest Pocket(cm)
5.1             < 3
RUQ(cm)       RLQ(cm)       LUQ(cm)        LLQ(cm)
0             0             3.1            2
Biophysical Evaluation

Amniotic F.V:   Pocket => 2 cm two         F. Tone:        Observed
planes
F. Movement:    Observed                   Score:          [DATE]
F. Breathing:   Observed
OB History

Gravidity:    5         Term:   2        Prem:   0        SAB:   2
TOP:          0       Ectopic:  0        Living: 2
Gestational Age

LMP:           34w 3d        Date:  03/17/17                 EDD:   12/22/17
Best:          34w 3d     Det. By:  LMP  (03/17/17)          EDD:   12/22/17
Anatomy

Thoracic:              Appears normal         Abdomen:                Appears normal
Heart:                 Appears normal         Kidneys:                Appear normal
(4CH, axis, and situs
Diaphragm:             Appears normal         Bladder:                Appears normal
Stomach:               Appears normal, left
sided
Cervix Uterus Adnexa

Cervix
Not visualized (advanced GA >14wks)
Impression

Low amniotic fluid. Patient was recently discharged. PPROM
was ruled out on clinical examination.

Patient reports good fetal movements.

On today's ultrasound, amniotic fluid is decreased (AFI=5
cm). Antenatal testing is reassuring. BPP [DATE]. NST performed
before discharge was reactive.
Recommendations

Follow-up BPP and NST on [DATE] and 11/20/17.

## 2017-11-13 NOTE — Progress Notes (Signed)
   PRENATAL VISIT NOTE  Subjective:  Tracey [name] is a 42 y.o. [id number] at 2856w3d being seen today for ongoing prenatal care.  She is currently monitored for the following issues for this high-risk pregnancy and has Chronic hypertension during pregnancy, antepartum; Anxiety, generalized; Supervision of high risk pregnancy, antepartum; History of prior pregnancy with IUGR newborn; Vitamin D deficiency; AMA (advanced maternal age) multigravida 35+; Carpal tunnel syndrome during pregnancy; Heartburn during pregnancy, antepartum; GDM (gestational diabetes mellitus); and Oligohydramnios antepartum on their problem list.  Patient reports no complaints.  Contractions: Irregular. Vag. Bleeding: None.  Movement: Present. Denies leaking of fluid.   The following portions of the patient's history were reviewed and updated as appropriate: allergies, current medications, past family history, past medical history, past social history, past surgical history and problem list. Problem list updated.  Objective:   Vitals:   11/13/17 1508 11/13/17 1511  BP: (!) 147/76 131/83  Pulse: 90 81  Weight: 97 kg     Fetal Status: Fetal Heart Rate (bpm): NST    Movement: Present     General:  Alert, oriented and cooperative. Patient is in no acute distress.  Skin: Skin is warm and dry. No rash noted.   Cardiovascular: Normal heart rate noted  Respiratory: Normal respiratory effort, no problems with respiration noted  Abdomen: Soft, gravid, appropriate for gestational age.  Pain/Pressure: Present     Pelvic: Cervical exam deferred        Extremities: Normal range of motion.  Edema: None  Mental Status: Normal mood and affect. Normal behavior. Normal judgment and thought content.   Assessment and Plan:  Pregnancy: [id number] at 7956w3d  1. Supervision of high risk pregnancy, antepartum --Anticipatory guidance about next visits/weeks of pregnancy given. --Pt anxious with recent admission to hospital, discussed all lab  and antenatal testing with pt  Reassurance provided with 10/10 today with BPP/NST results.  Kick/movement counting/reasons to seek care reviewed. - Fetal nonstress test; Future - Strep Gp B NAA - Cervicovaginal ancillary only  2. Oligohydramnios in third trimester, single or unspecified fetus --Pt with hospital admission for oligo 8/23-8/25 with AFI improved and plan for close outpatient f/u --Pt continues to wear pad for wetness/leakage.  Testing on 8/23 on hospital admission was negative for PPROM.  SSE performed today and negative pooling, nitrazine, and ferning today. No evidence of PPROM. --AFI 5 with BPP 8/8 with MFM, NST reactive here today.   --F/U Friday 8/30 as scheduled for BPP with MFM  3. Diabetes mellitus affecting pregnancy in third trimester   4. Chronic hypertension during pregnancy, antepartum --Not on meds, taking ASA. BP wnl today.  Preterm labor symptoms and general obstetric precautions including but not limited to vaginal bleeding, contractions, leaking of fluid and fetal movement were reviewed in detail with the patient. Please refer to After Visit Summary for other counseling recommendations.  No follow-ups on file.  Future Appointments  Date Time Provider Department Center  11/16/2017 10:30 AM [facility]-M[facility] US 5 [facility]-M[facility]US M[facility]-US  11/16/2017 11:30 AM [facility]-M[facility] NST [facility]  11/20/2017  9:30 AM [facility]-M[facility] US 1 [facility]-M[facility]  11/20/2017 10:45 AM [facility]-MFC NST [facility]  11/23/2017  9:15 AM [facility]-MFC US 4 [facility]-M[facility]US M[facility]-US  11/27/2017 10:15 AM [facility]-MFC US 4 [facility]-MFCUS MFC-US  11/30/2017  9:45 AM [facility]-MFC US 2 WH-MFCUS MFC-US    Sharen CounterLisa Leftwich-Kirby, CNM

## 2017-11-13 NOTE — Progress Notes (Signed)
Pt is G5P2 2558w3d here for ROB and NST. CHTN, on aspirin. GDM med controlled. NST reactive

## 2017-11-14 LAB — CERVICOVAGINAL ANCILLARY ONLY
CHLAMYDIA, DNA PROBE: NEGATIVE
Candida vaginitis: POSITIVE — AB
Neisseria Gonorrhea: NEGATIVE

## 2017-11-15 LAB — STREP GP B NAA: Strep Gp B NAA: POSITIVE — AB

## 2017-11-16 ENCOUNTER — Ambulatory Visit (HOSPITAL_COMMUNITY)
Admission: RE | Admit: 2017-11-16 | Discharge: 2017-11-16 | Disposition: A | Discharge status: Home / Self Care | Payer: 59 | Source: Ambulatory Visit | Attending: Obstetrics and Gynecology | Admitting: Obstetrics and Gynecology

## 2017-11-16 ENCOUNTER — Other Ambulatory Visit (HOSPITAL_COMMUNITY): Payer: Self-pay | Admitting: Maternal and Fetal Medicine

## 2017-11-16 ENCOUNTER — Encounter (HOSPITAL_COMMUNITY): Payer: Self-pay

## 2017-11-16 ENCOUNTER — Other Ambulatory Visit (HOSPITAL_COMMUNITY): Payer: 59

## 2017-11-16 VITALS — BP 118/69 | HR 77

## 2017-11-16 DIAGNOSIS — O09293 Supervision of pregnancy with other poor reproductive or obstetric history, third trimester: Secondary | ICD-10-CM | POA: Diagnosis not present

## 2017-11-16 DIAGNOSIS — O24415 Gestational diabetes mellitus in pregnancy, controlled by oral hypoglycemic drugs: Secondary | ICD-10-CM

## 2017-11-16 DIAGNOSIS — O09523 Supervision of elderly multigravida, third trimester: Secondary | ICD-10-CM | POA: Insufficient documentation

## 2017-11-16 DIAGNOSIS — O288 Other abnormal findings on antenatal screening of mother: Secondary | ICD-10-CM

## 2017-11-16 DIAGNOSIS — Z3A34 34 weeks gestation of pregnancy: Secondary | ICD-10-CM

## 2017-11-16 DIAGNOSIS — O0990 Supervision of high risk pregnancy, unspecified, unspecified trimester: Secondary | ICD-10-CM

## 2017-11-16 DIAGNOSIS — O10913 Unspecified pre-existing hypertension complicating pregnancy, third trimester: Secondary | ICD-10-CM | POA: Diagnosis not present

## 2017-11-16 DIAGNOSIS — O10919 Unspecified pre-existing hypertension complicating pregnancy, unspecified trimester: Secondary | ICD-10-CM

## 2017-11-16 DIAGNOSIS — O10013 Pre-existing essential hypertension complicating pregnancy, third trimester: Secondary | ICD-10-CM | POA: Diagnosis not present

## 2017-11-16 DIAGNOSIS — O10019 Pre-existing essential hypertension complicating pregnancy, unspecified trimester: Secondary | ICD-10-CM | POA: Diagnosis present

## 2017-11-16 IMAGING — US US MFM OB FOLLOW-UP
1 series · 12 of 28 positions shown · non-contrast
Comparison: none

[Series 1: us mfm ob follow-up · 40 acquisitions, 12 frames shown]
[im 2/40]
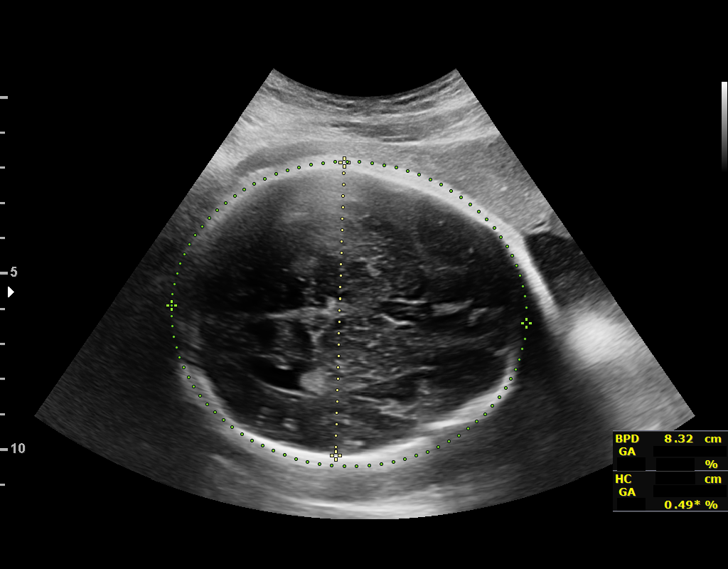
[im 5/40]
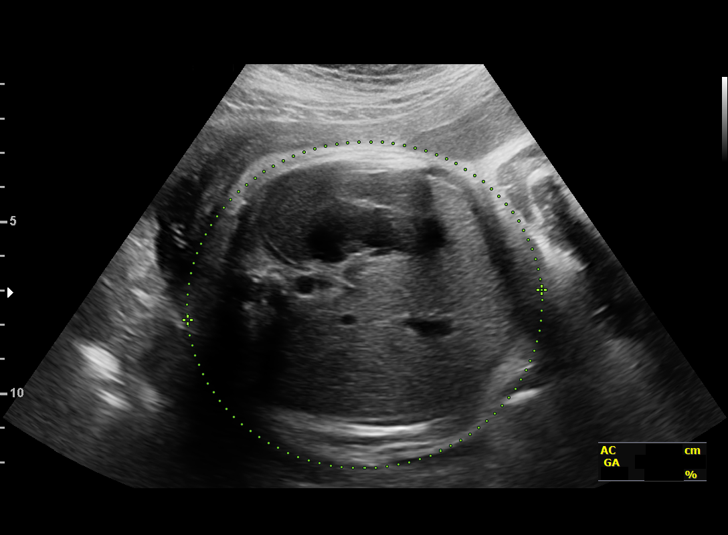
[im 8/40]
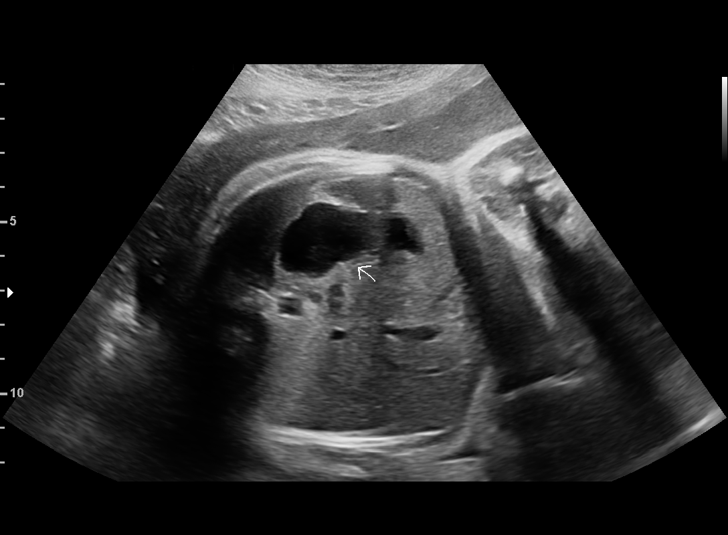
[im 12/40]
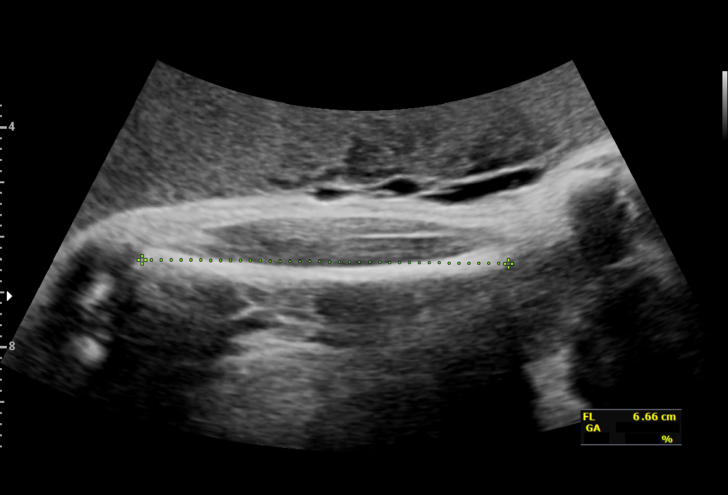
[im 15/40]
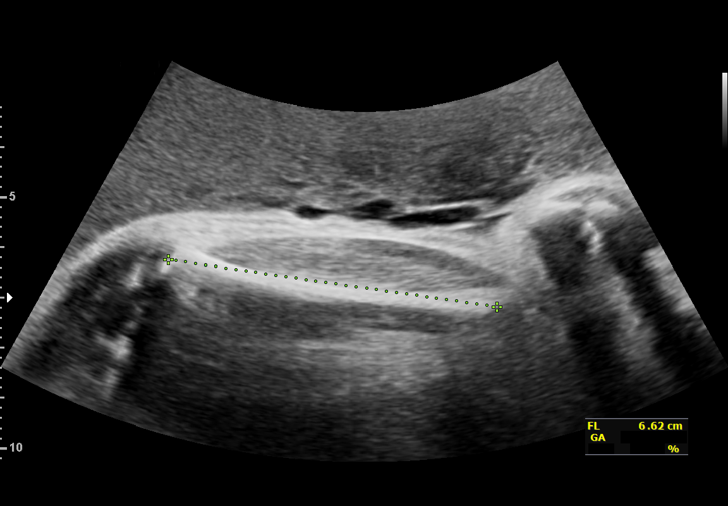
[im 18/40]
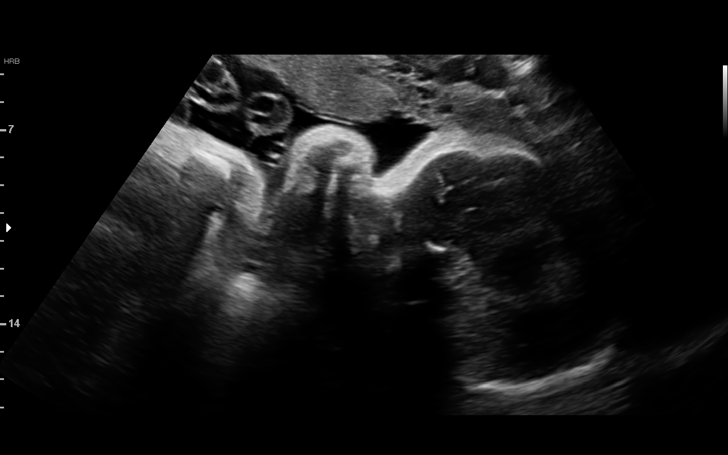
[im 22/40]
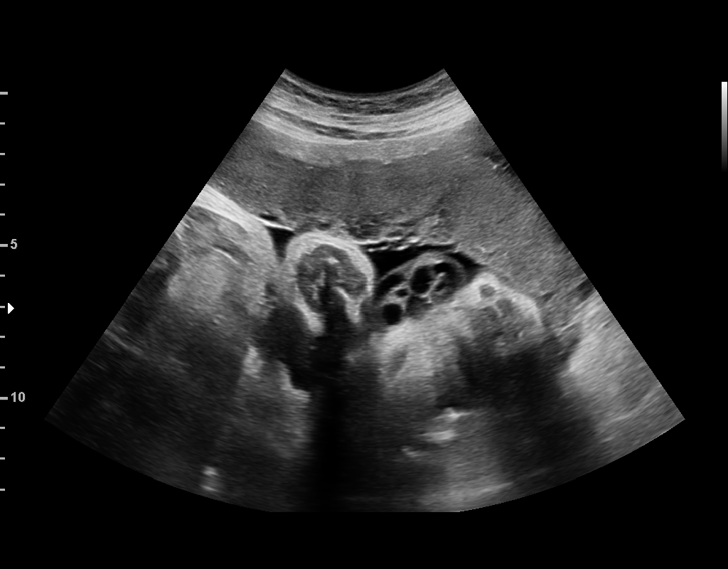
[im 25/40]
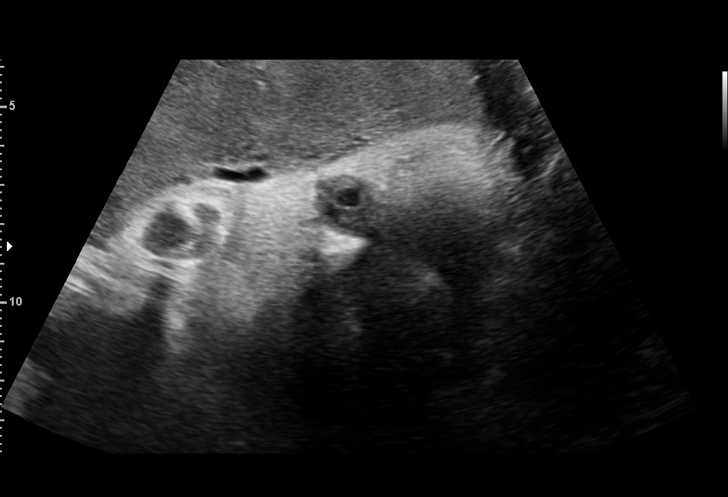
[im 28/40]
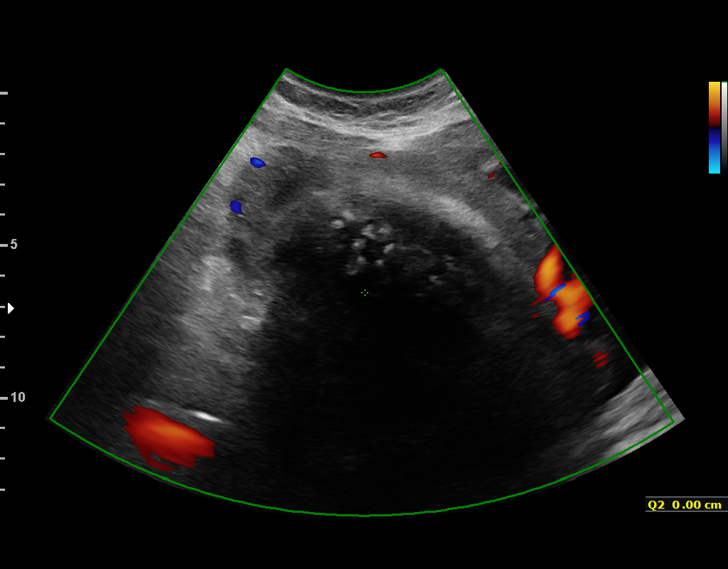
[im 32/40]
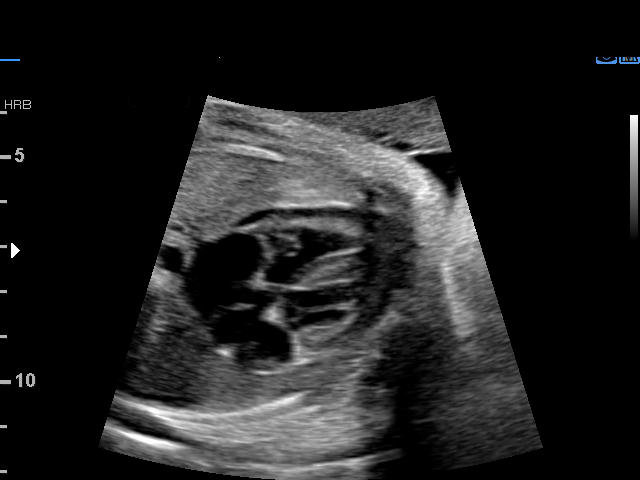
[im 35/40]
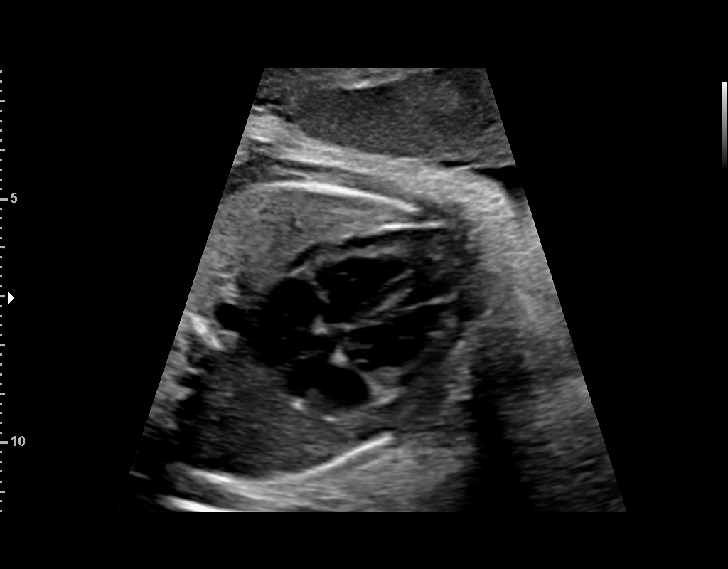
[im 38/40]
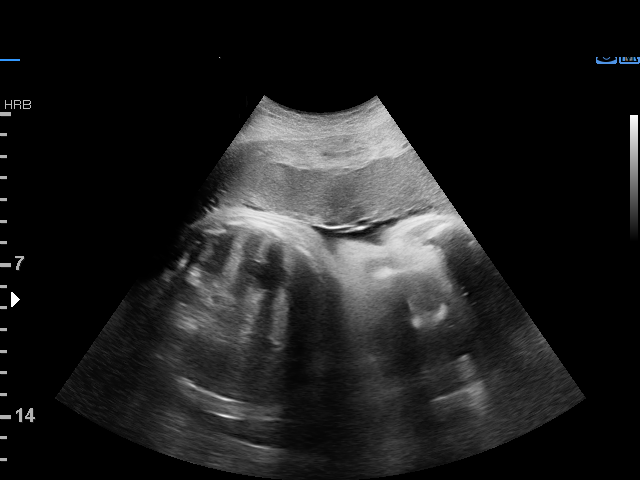

[12 of 28 positions shown; findings below may reference images not displayed]

Attending:        Yashu Ps      Secondary Phy.:   3rd Nursing- HR
OB

W/NONSTRESS

Indications

Poor obstetric history: Previous fetal growth
restriction (FGR)
Hypertension - Chronic/Pre-existing (ASA)
34 weeks gestation of pregnancy
Obesity complicating pregnancy, third
trimester (pregravid BMI 33)
Advanced maternal age multigravida 39,
third trimester (low risk NIPS)
Gestational diabetes in pregnancy,
controlled by oral hypoglycemic drugs
Vital Signs

BMI:
Fetal Evaluation

Num Of Fetuses:         1
Fetal Heart Rate(bpm):  150
Cardiac Activity:       Observed
Presentation:           Cephalic
Placenta:               Anterior
P. Cord Insertion:      Previously Visualized
Amniotic Fluid
AFI FV:      Within normal limits

AFI Sum(cm)     %Tile       Largest Pocket(cm)
11.11           28

RUQ(cm)       RLQ(cm)       LUQ(cm)        LLQ(cm)
5.52          3.59          0              2
Biophysical Evaluation

Amniotic F.V:   Within normal limits       F. Tone:        Observed
F. Movement:    Observed                   N.S.T:          Reactive
F. Breathing:   Observed                   Score:          [DATE]
Biometry

BPD:      83.1  mm     G. Age:  33w 3d         14  %    CI:        79.63   %    70 - 86
FL/HC:      22.4   %    20.1 -
HC:      294.3  mm     G. Age:  32w 4d        < 3  %    HC/AC:      0.95        0.93 -
AC:      311.2  mm     G. Age:  35w 0d         62  %    FL/BPD:     79.4   %    71 - 87
FL:         66  mm     G. Age:  34w 0d         21  %    FL/AC:      21.2   %    20 - 24
HUM:      59.8  mm     G. Age:  34w 5d         61  %

Est. FW:    7529  gm      5 lb 5 oz     50  %
OB History

Gravidity:    5         Term:   2        Prem:   0        SAB:   2
TOP:          0       Ectopic:  0        Living: 2
Gestational Age

LMP:           34w 6d        Date:  03/17/17                 EDD:   12/22/17
U/S Today:     33w 5d                                        EDD:   12/30/17
Best:          34w 6d     Det. By:  LMP  (03/17/17)          EDD:   12/22/17
Anatomy

Cranium:               Appears normal         Aortic Arch:            Previously seen
Cavum:                 Appears normal         Ductal Arch:            Previously seen
Ventricles:            Appears normal         Diaphragm:              Previously seen
Choroid Plexus:        Previously seen        Stomach:                Appears normal, left
sided
Cerebellum:            Previously seen        Abdomen:                Previously seen
Posterior Fossa:       Previously seen        Abdominal Wall:         Previously seen
Nuchal Fold:           Previously seen        Cord Vessels:           Previously seen
Face:                  Orbits and profile     Kidneys:                Appear normal
previously seen
Lips:                  Previously seen        Bladder:                Appears normal
Thoracic:              Appears normal         Spine:                  Previously seen
Heart:                 Appears normal         Upper Extremities:      Previously seen
(4CH, axis, and situs
RVOT:                  Previously seen        Lower Extremities:      Previously seen
LVOT:                  Previously seen

Other:  Female gender prev seen. Heels and 5th digit prev. visualized. Nasal
bone prev.  visualized. Technically difficult due to advanced GA and
fetal position.
Cervix Uterus Adnexa

Cervix
Not visualized (advanced GA >80wks)
Comments

U/S images reviewed. Findings reviewed with patient.
Appropriate interval growth is noted.  No fetal abnormalities
are seen.  No evidence of fetal compromise is found on BPP
today.  AFI has normalized.
Questions answered.
10 minutes spent face to face with patient.
Recommendations: 1) Serial U/S every 4 weeks for fetal
growth 2) Weekly BPP
Recommendations

1) Serial U/S every 4 weeks for fetal growth 2) Weekly BPP

## 2017-11-16 NOTE — Procedures (Signed)
Tracey [name] 1975-09-20 4444w6d  Fetus A Non-Stress Test Interpretation for 11/16/17  Indication: AMA, CHTN, Hx of IUGR  Fetal Heart Rate A Mode: External Baseline Rate (A): 150 bpm Variability: Moderate Accelerations: 15 x 15 Decelerations: None Multiple birth?: No  Uterine Activity Mode: Palpation, Toco Contraction Frequency (min): U/I(Pt denies feeling) Contraction Duration (sec): 30-50 Contraction Quality: Mild(Pt denies feeling) Resting Tone Palpated: Relaxed  Interpretation (Fetal Testing) Nonstress Test Interpretation: Reactive Comments: Reviewed tracing with Dr. Perry MountJacobson

## 2017-11-20 ENCOUNTER — Encounter (HOSPITAL_COMMUNITY): Payer: Self-pay

## 2017-11-20 ENCOUNTER — Ambulatory Visit (HOSPITAL_COMMUNITY)
Admission: RE | Admit: 2017-11-20 | Discharge: 2017-11-20 | Disposition: A | Discharge status: Home / Self Care | Payer: 59 | Source: Ambulatory Visit | Attending: Obstetrics and Gynecology | Admitting: Obstetrics and Gynecology

## 2017-11-20 ENCOUNTER — Other Ambulatory Visit (HOSPITAL_COMMUNITY): Payer: Self-pay | Admitting: Maternal and Fetal Medicine

## 2017-11-20 DIAGNOSIS — O288 Other abnormal findings on antenatal screening of mother: Secondary | ICD-10-CM

## 2017-11-20 DIAGNOSIS — O99213 Obesity complicating pregnancy, third trimester: Secondary | ICD-10-CM

## 2017-11-20 DIAGNOSIS — O09293 Supervision of pregnancy with other poor reproductive or obstetric history, third trimester: Secondary | ICD-10-CM

## 2017-11-20 DIAGNOSIS — O10013 Pre-existing essential hypertension complicating pregnancy, third trimester: Secondary | ICD-10-CM

## 2017-11-20 DIAGNOSIS — O24415 Gestational diabetes mellitus in pregnancy, controlled by oral hypoglycemic drugs: Secondary | ICD-10-CM

## 2017-11-20 DIAGNOSIS — O4100X0 Oligohydramnios, unspecified trimester, not applicable or unspecified: Secondary | ICD-10-CM

## 2017-11-20 DIAGNOSIS — Z3A35 35 weeks gestation of pregnancy: Secondary | ICD-10-CM | POA: Diagnosis not present

## 2017-11-20 DIAGNOSIS — O09523 Supervision of elderly multigravida, third trimester: Secondary | ICD-10-CM | POA: Diagnosis not present

## 2017-11-20 IMAGING — US US MFM FETAL BPP W/ NON-STRESS
1 series · 15 of 18 positions shown · non-contrast
Comparison: none

[Series 1: us mfm fetal bpp w/ non-stress · 18 acquisitions, 15 frames shown]
[im 1/18]
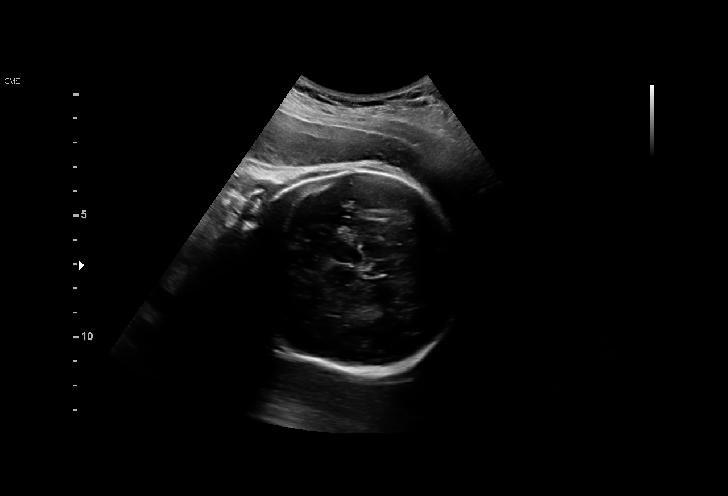
[im 2/18]
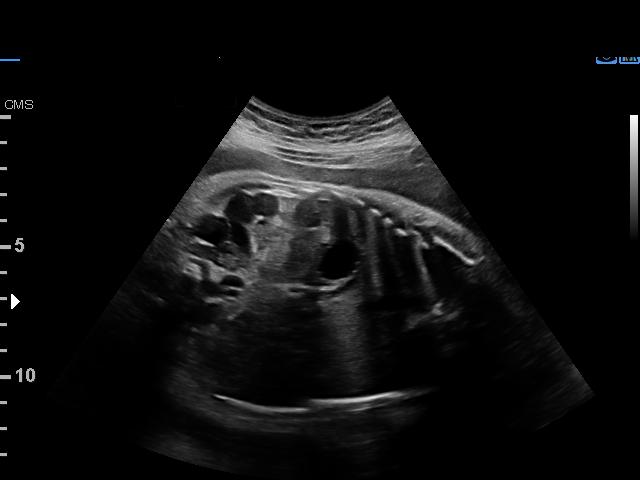
[im 4/18]
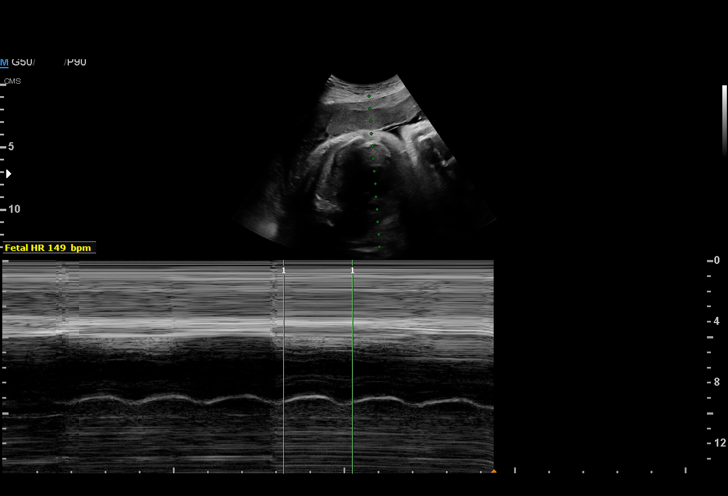
[im 5/18]
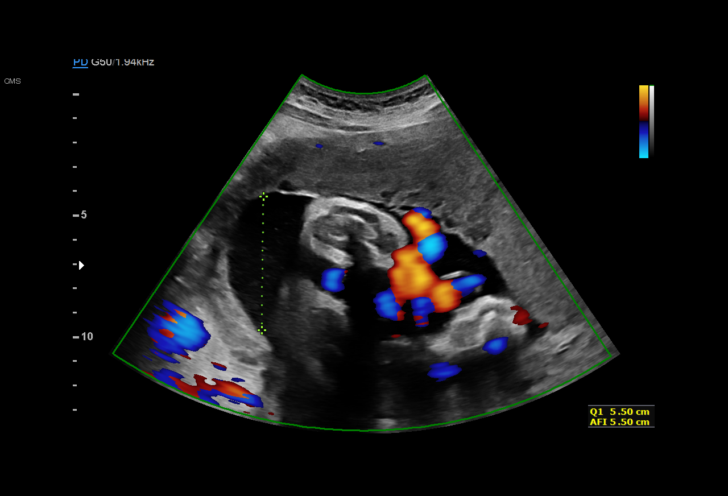
[im 6/18]
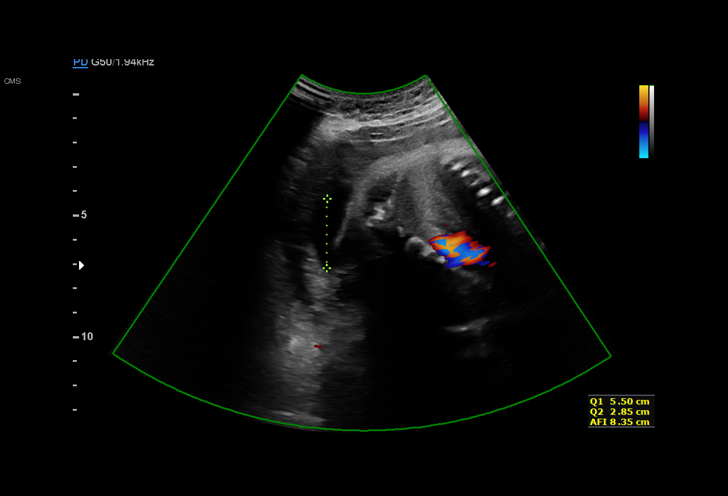
[im 7/18]
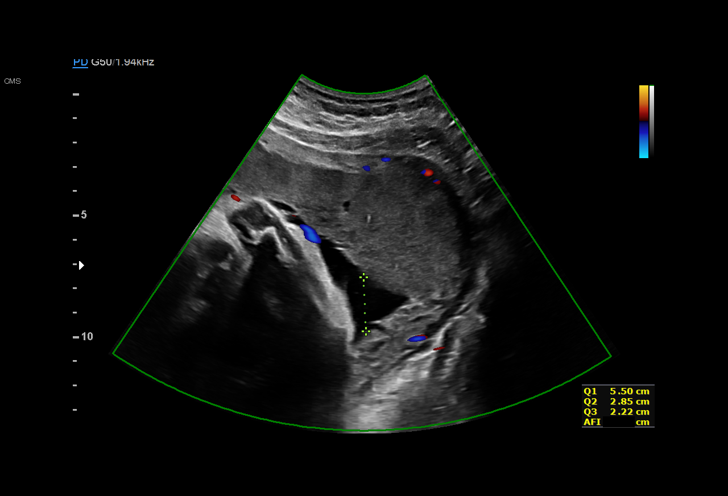
[im 8/18]
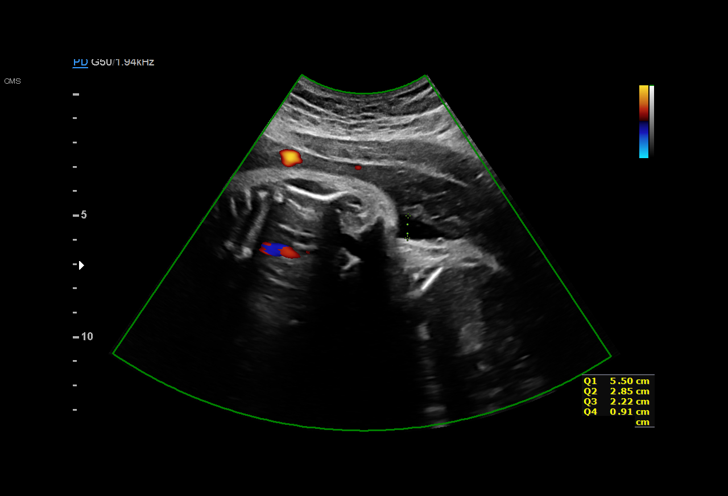
[im 10/18]
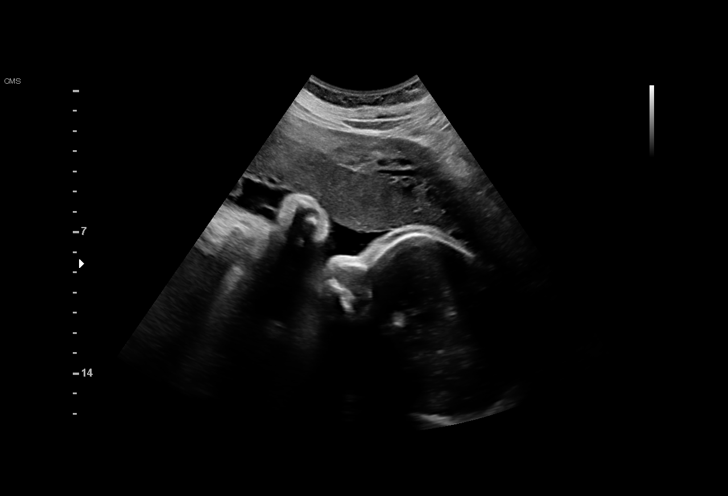
[im 11/18]
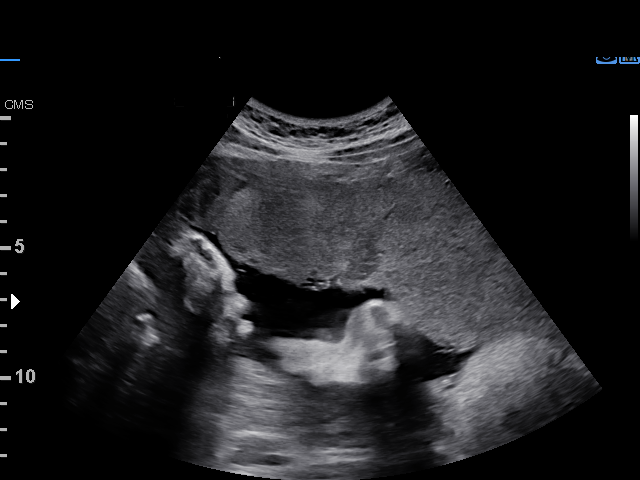
[im 12/18]
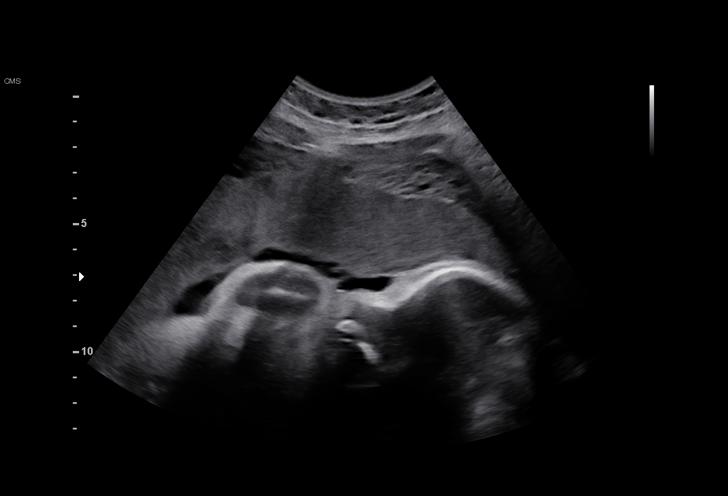
[im 13/18]
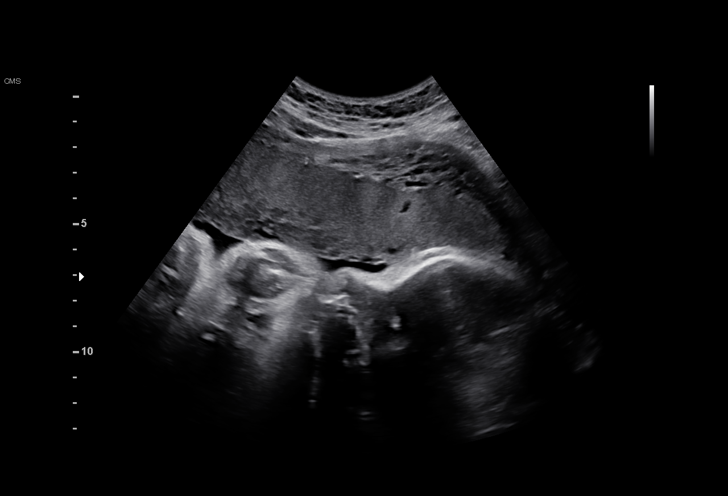
[im 14/18]
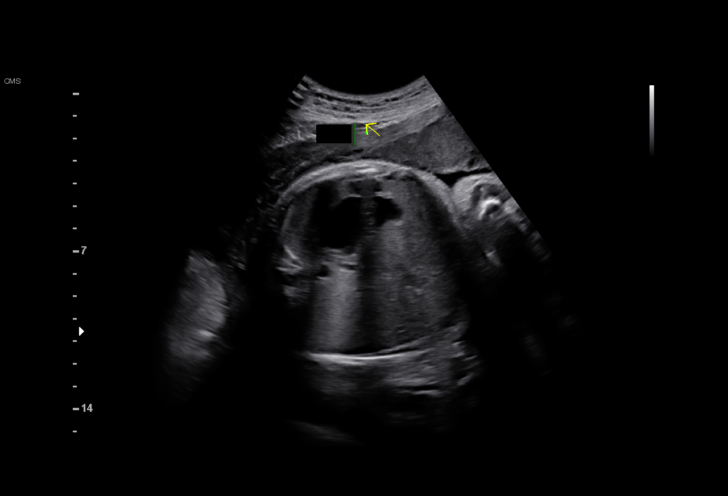
[im 16/18]
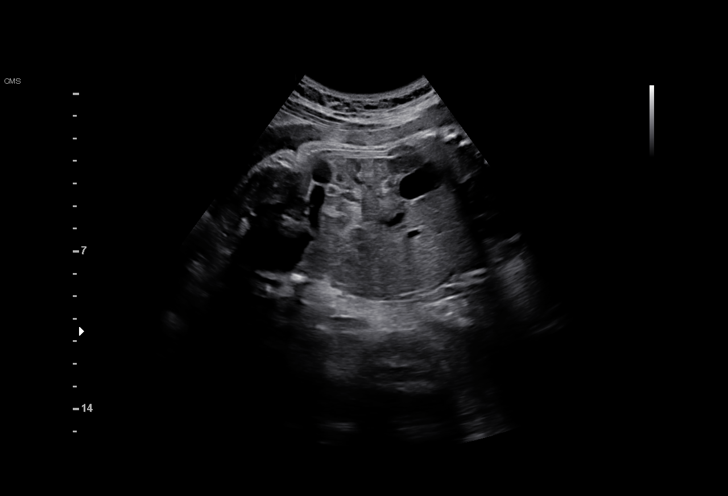
[im 17/18]
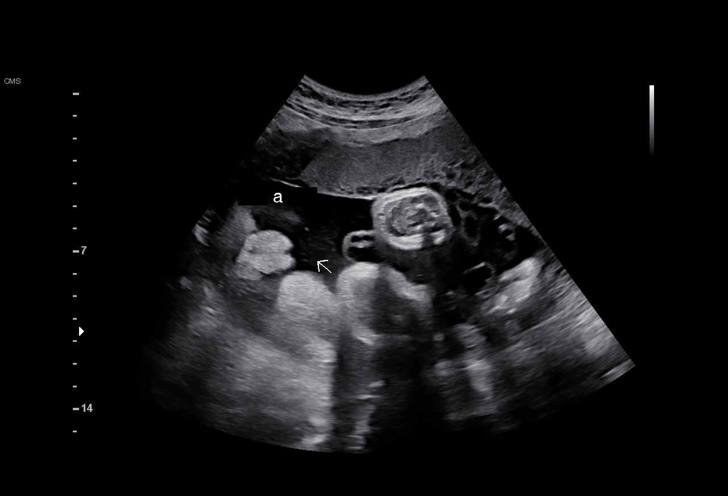
[im 18/18]
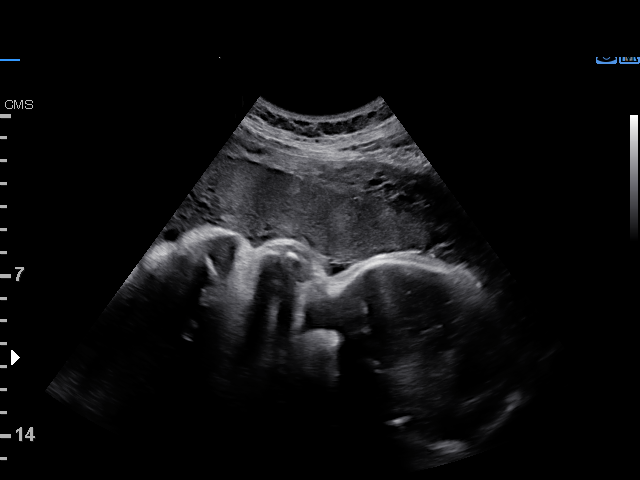

[15 of 18 positions shown; findings below may reference images not displayed]

OB/Gyn Clinic

W/NONSTRESS

Indications

Poor obstetric history: Previous fetal growth
restriction (FGR)
Hypertension - Chronic/Pre-existing (ASA)
Obesity complicating pregnancy, third
trimester (pregravid BMI 33)
Advanced maternal age multigravida 39,
third trimester (low risk NIPS)
Gestational diabetes in pregnancy,
controlled by oral hypoglycemic drugs
35 weeks gestation of pregnancy
Vital Signs

Height:        5'6"
Fetal Evaluation

Num Of Fetuses:         1
Fetal Heart Rate(bpm):  149
Cardiac Activity:       Observed
Presentation:           Cephalic

Amniotic Fluid
AFI FV:      Within normal limits

AFI Sum(cm)     %Tile       Largest Pocket(cm)
11.48           32
RUQ(cm)       RLQ(cm)       LUQ(cm)        LLQ(cm)
5.5
Biophysical Evaluation

Amniotic F.V:   Within normal limits       F. Tone:        Observed
F. Movement:    Observed                   N.S.T:          Nonreactive
F. Breathing:   Observed                   Score:          [DATE]
OB History

Gravidity:    5         Term:   2        Prem:   0        SAB:   2
TOP:          0       Ectopic:  0        Living: 2
Gestational Age

LMP:           35w 3d        Date:  03/17/17                 EDD:   12/22/17
Best:          35w 3d     Det. By:  LMP  (03/17/17)          EDD:   12/22/17
Comments

U/S images reviewed. Findings reviewed with patient.
Appropriate interval growth is noted.  No fetal abnormalities
are seen.  No evidence of fetal compromise is found on BPP
today.  AFI has normalized.
Questions answered.
10 minutes spent face to face with patient.
Recommendations: 1) Serial U/S every 4 weeks for fetal
growth 2)Twice weekly A-P surveillance

## 2017-11-21 ENCOUNTER — Telehealth: Payer: Self-pay

## 2017-11-21 NOTE — Telephone Encounter (Signed)
Returned call and answered questions about + Group B results.

## 2017-11-23 ENCOUNTER — Ambulatory Visit (HOSPITAL_COMMUNITY)
Admission: RE | Admit: 2017-11-23 | Discharge: 2017-11-23 | Disposition: A | Discharge status: Home / Self Care | Payer: 59 | Source: Ambulatory Visit | Attending: Obstetrics and Gynecology | Admitting: Obstetrics and Gynecology

## 2017-11-23 ENCOUNTER — Encounter (HOSPITAL_COMMUNITY): Payer: Self-pay

## 2017-11-23 DIAGNOSIS — O99213 Obesity complicating pregnancy, third trimester: Secondary | ICD-10-CM

## 2017-11-23 DIAGNOSIS — O09293 Supervision of pregnancy with other poor reproductive or obstetric history, third trimester: Secondary | ICD-10-CM

## 2017-11-23 DIAGNOSIS — O288 Other abnormal findings on antenatal screening of mother: Secondary | ICD-10-CM | POA: Insufficient documentation

## 2017-11-23 DIAGNOSIS — O24415 Gestational diabetes mellitus in pregnancy, controlled by oral hypoglycemic drugs: Secondary | ICD-10-CM | POA: Diagnosis not present

## 2017-11-23 DIAGNOSIS — O09523 Supervision of elderly multigravida, third trimester: Secondary | ICD-10-CM | POA: Diagnosis not present

## 2017-11-23 DIAGNOSIS — Z3A35 35 weeks gestation of pregnancy: Secondary | ICD-10-CM | POA: Insufficient documentation

## 2017-11-23 IMAGING — US US MFM FETAL BPP W/O NON-STRESS
1 series · 12 of 14 positions shown · non-contrast
Comparison: none

[Series 1: us mfm fetal bpp w/o non-stress · 14 acquisitions, 12 frames shown]
[im 1/14]
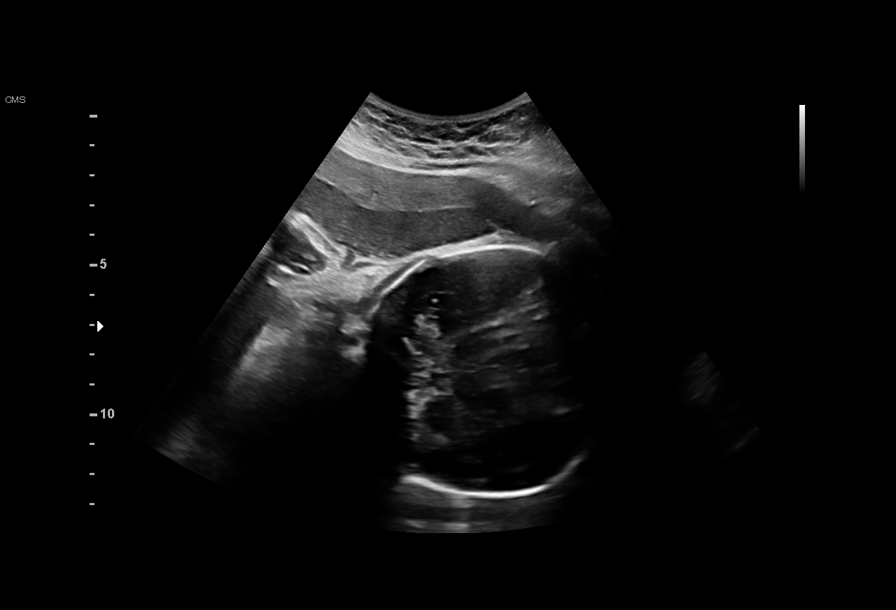
[im 2/14]
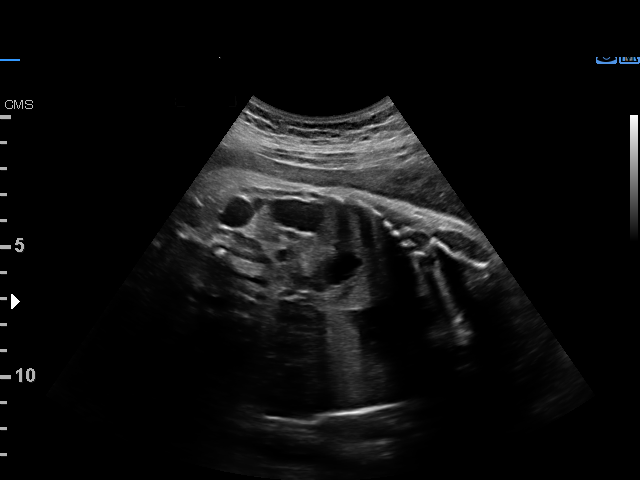
[im 3/14]
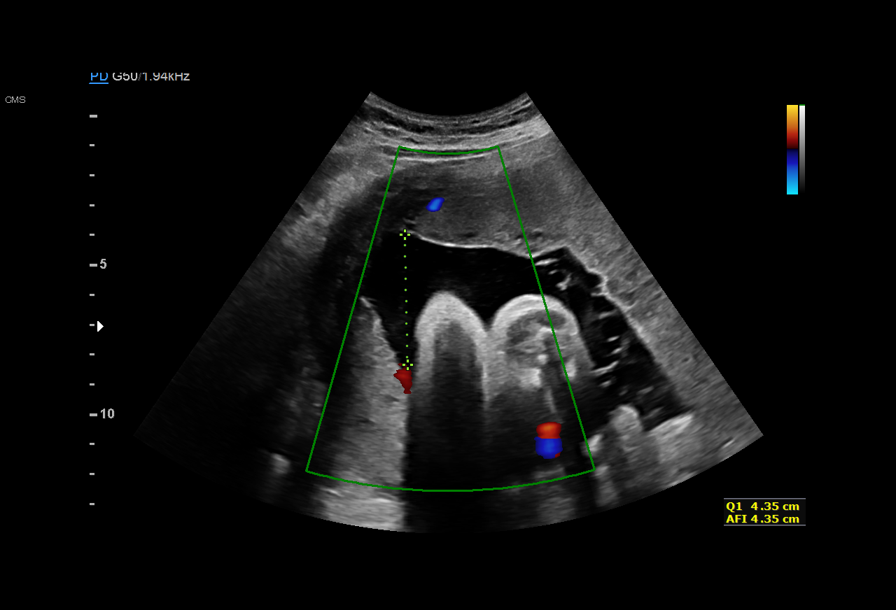
[im 5/14]
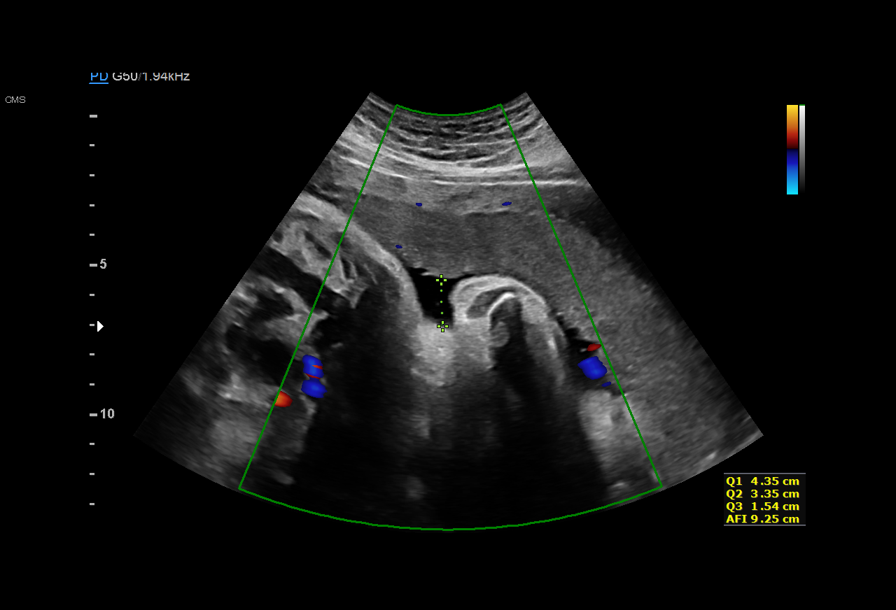
[im 6/14]
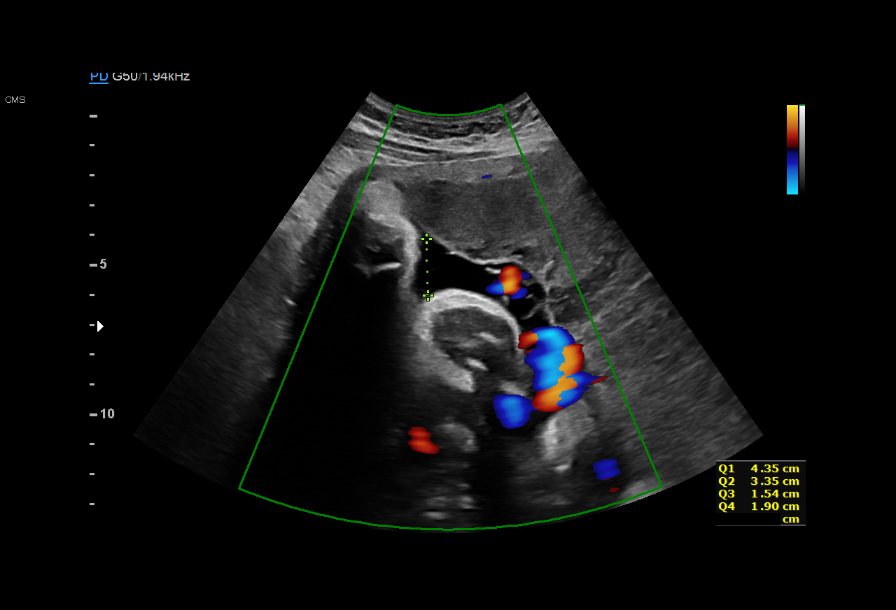
[im 7/14]
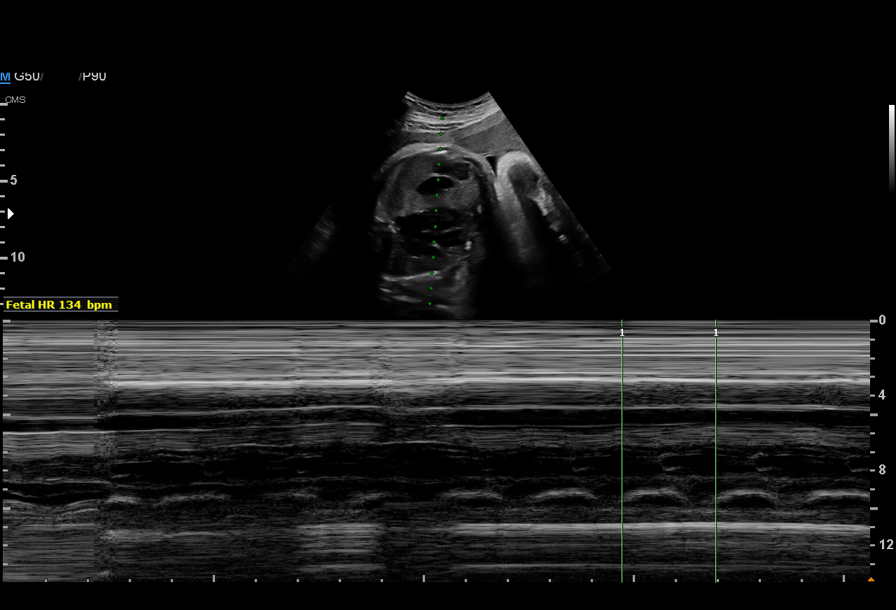
[im 8/14]
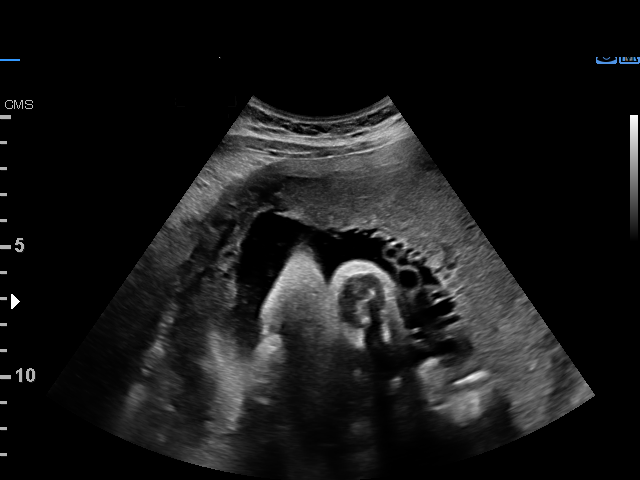
[im 9/14]
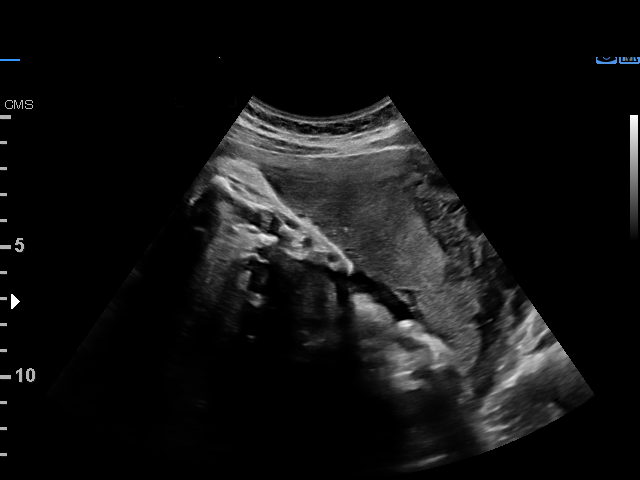
[im 10/14]
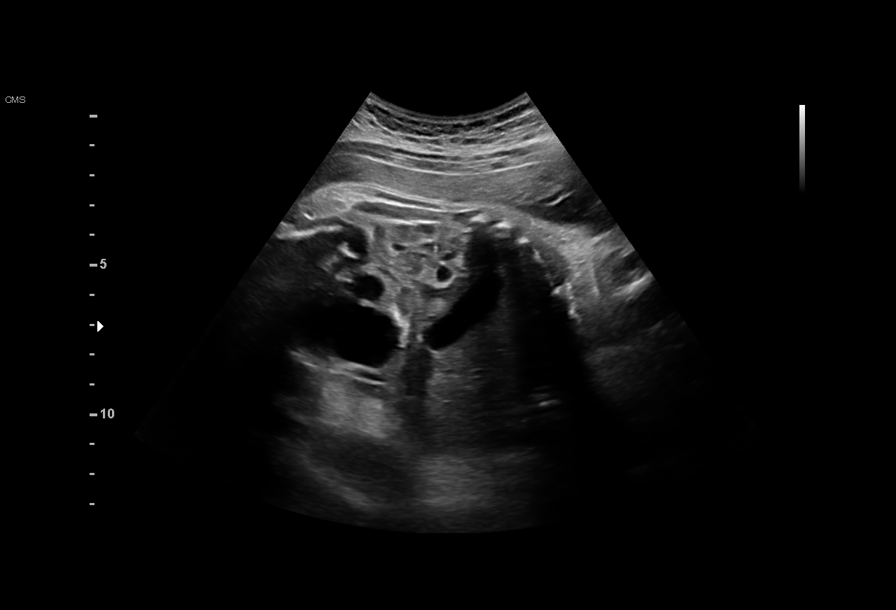
[im 12/14]
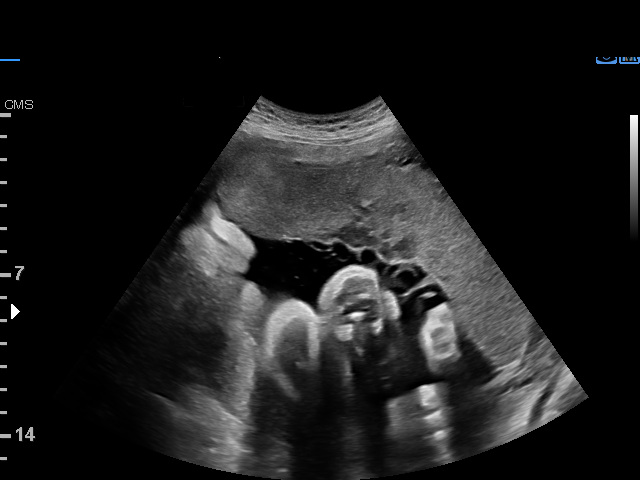
[im 13/14]
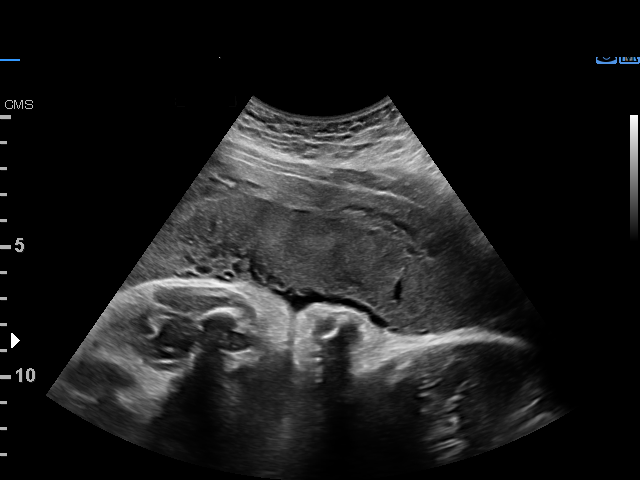
[im 14/14]
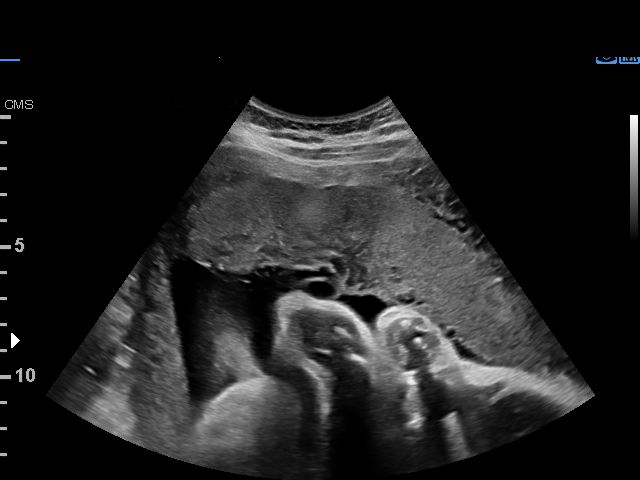

[12 of 14 positions shown; findings below may reference images not displayed]

OB/Gyn Clinic

Indications

Poor obstetric history: Previous fetal growth
restriction (FGR)
Hypertension - Chronic/Pre-existing (ASA)
Obesity complicating pregnancy, third
trimester (pregravid BMI 33)
Advanced maternal age multigravida 39,
third trimester (low risk NIPS)
Gestational diabetes in pregnancy,
controlled by oral hypoglycemic drugs
35 weeks gestation of pregnancy
Vital Signs

Height:        5'6"
Fetal Evaluation

Num Of Fetuses:         1
Fetal Heart Rate(bpm):  134
Cardiac Activity:       Observed

Amniotic Fluid
AFI FV:      Within normal limits

AFI Sum(cm)     %Tile       Largest Pocket(cm)
11.14           30
RUQ(cm)       RLQ(cm)       LUQ(cm)        LLQ(cm)
4.35
Biophysical Evaluation

Amniotic F.V:   Within normal limits       F. Tone:        Observed
F. Movement:    Observed                   Score:          [DATE]
F. Breathing:   Observed
OB History

Gravidity:    5         Term:   2        Prem:   0        SAB:   2
TOP:          0       Ectopic:  0        Living: 2
Gestational Age

LMP:           35w 6d        Date:  03/17/17                 EDD:   12/22/17
Best:          35w 6d     Det. By:  LMP  (03/17/17)          EDD:   12/22/17
Comments

U/S images reviewed. Findings reviewed with patient.   No
fetal abnormalities are seen.  No evidence of fetal
compromise is found on BPP today.   Amniotic fluid is normal.
Questions answered.
10 minutes spent face to face with patient.
Recommendations: 1) Serial U/S every 4 weeks for fetal
growth 2) Weekly BPP
Recommendations

1) Serial U/S every 4 weeks for fetal growth 2) Weekly BPP

## 2017-11-27 ENCOUNTER — Inpatient Hospital Stay (HOSPITAL_COMMUNITY): Payer: 59 | Admitting: Anesthesiology

## 2017-11-27 ENCOUNTER — Inpatient Hospital Stay (HOSPITAL_COMMUNITY)
Admission: AD | Admit: 2017-11-27 | Discharge: 2017-11-29 | DRG: 805 | Disposition: A | Discharge status: Home / Self Care | Payer: 59 | Attending: Family Medicine | Admitting: Family Medicine

## 2017-11-27 ENCOUNTER — Ambulatory Visit (HOSPITAL_COMMUNITY): Admission: RE | Admit: 2017-11-27 | Payer: 59 | Source: Ambulatory Visit

## 2017-11-27 ENCOUNTER — Encounter (HOSPITAL_COMMUNITY): Payer: Self-pay

## 2017-11-27 ENCOUNTER — Other Ambulatory Visit: Payer: Self-pay

## 2017-11-27 DIAGNOSIS — O99334 Smoking (tobacco) complicating childbirth: Secondary | ICD-10-CM | POA: Diagnosis present

## 2017-11-27 DIAGNOSIS — O42913 Preterm premature rupture of membranes, unspecified as to length of time between rupture and onset of labor, third trimester: Secondary | ICD-10-CM | POA: Diagnosis not present

## 2017-11-27 DIAGNOSIS — Z3A36 36 weeks gestation of pregnancy: Secondary | ICD-10-CM

## 2017-11-27 DIAGNOSIS — O24425 Gestational diabetes mellitus in childbirth, controlled by oral hypoglycemic drugs: Secondary | ICD-10-CM | POA: Diagnosis present

## 2017-11-27 DIAGNOSIS — O1092 Unspecified pre-existing hypertension complicating childbirth: Secondary | ICD-10-CM | POA: Diagnosis not present

## 2017-11-27 DIAGNOSIS — O9962 Diseases of the digestive system complicating childbirth: Secondary | ICD-10-CM | POA: Diagnosis present

## 2017-11-27 DIAGNOSIS — O09529 Supervision of elderly multigravida, unspecified trimester: Secondary | ICD-10-CM

## 2017-11-27 DIAGNOSIS — F17210 Nicotine dependence, cigarettes, uncomplicated: Secondary | ICD-10-CM | POA: Diagnosis present

## 2017-11-27 DIAGNOSIS — O99824 Streptococcus B carrier state complicating childbirth: Secondary | ICD-10-CM

## 2017-11-27 DIAGNOSIS — O24419 Gestational diabetes mellitus in pregnancy, unspecified control: Secondary | ICD-10-CM | POA: Diagnosis present

## 2017-11-27 DIAGNOSIS — E559 Vitamin D deficiency, unspecified: Secondary | ICD-10-CM | POA: Diagnosis present

## 2017-11-27 DIAGNOSIS — F419 Anxiety disorder, unspecified: Secondary | ICD-10-CM | POA: Diagnosis present

## 2017-11-27 DIAGNOSIS — O26899 Other specified pregnancy related conditions, unspecified trimester: Secondary | ICD-10-CM | POA: Diagnosis present

## 2017-11-27 DIAGNOSIS — O4290 Premature rupture of membranes, unspecified as to length of time between rupture and onset of labor, unspecified weeks of gestation: Secondary | ICD-10-CM

## 2017-11-27 DIAGNOSIS — O24429 Gestational diabetes mellitus in childbirth, unspecified control: Secondary | ICD-10-CM | POA: Diagnosis not present

## 2017-11-27 DIAGNOSIS — O10919 Unspecified pre-existing hypertension complicating pregnancy, unspecified trimester: Secondary | ICD-10-CM | POA: Diagnosis present

## 2017-11-27 DIAGNOSIS — K219 Gastro-esophageal reflux disease without esophagitis: Secondary | ICD-10-CM | POA: Diagnosis present

## 2017-11-27 DIAGNOSIS — R12 Heartburn: Secondary | ICD-10-CM

## 2017-11-27 DIAGNOSIS — O42013 Preterm premature rupture of membranes, onset of labor within 24 hours of rupture, third trimester: Secondary | ICD-10-CM | POA: Diagnosis not present

## 2017-11-27 DIAGNOSIS — G5600 Carpal tunnel syndrome, unspecified upper limb: Secondary | ICD-10-CM | POA: Diagnosis present

## 2017-11-27 DIAGNOSIS — Z8759 Personal history of other complications of pregnancy, childbirth and the puerperium: Secondary | ICD-10-CM

## 2017-11-27 DIAGNOSIS — O0990 Supervision of high risk pregnancy, unspecified, unspecified trimester: Secondary | ICD-10-CM

## 2017-11-27 HISTORY — DX: Oligohydramnios, unspecified trimester, not applicable or unspecified: O41.00X0

## 2017-11-27 LAB — URINALYSIS, ROUTINE W REFLEX MICROSCOPIC
BILIRUBIN URINE: NEGATIVE
Glucose, UA: NEGATIVE mg/dL
Ketones, ur: 5 mg/dL — AB
Nitrite: NEGATIVE
PH: 6 (ref 5.0–8.0)
Protein, ur: NEGATIVE mg/dL
SPECIFIC GRAVITY, URINE: 1.015 (ref 1.005–1.030)

## 2017-11-27 LAB — CBC
HEMATOCRIT: 35.5 % — AB (ref 36.0–46.0)
HEMOGLOBIN: 11.6 g/dL — AB (ref 12.0–15.0)
MCH: 28.6 pg (ref 26.0–34.0)
MCHC: 32.7 g/dL (ref 30.0–36.0)
MCV: 87.7 fL (ref 78.0–100.0)
Platelets: 279 10*3/uL (ref 150–400)
RBC: 4.05 MIL/uL (ref 3.87–5.11)
RDW: 14.6 % (ref 11.5–15.5)
WBC: 8.3 10*3/uL (ref 4.0–10.5)

## 2017-11-27 LAB — TYPE AND SCREEN
ABO/RH(D): B POS
Antibody Screen: NEGATIVE

## 2017-11-27 LAB — POCT FERN TEST: POCT Fern Test: POSITIVE

## 2017-11-27 LAB — GLUCOSE, CAPILLARY
GLUCOSE-CAPILLARY: 104 mg/dL — AB (ref 70–99)
Glucose-Capillary: 107 mg/dL — ABNORMAL HIGH (ref 70–99)
Glucose-Capillary: 98 mg/dL (ref 70–99)

## 2017-11-27 MED ORDER — LACTATED RINGERS IV SOLN: 500.0000 mL | INTRAVENOUS | Status: DC | PRN

## 2017-11-27 MED ORDER — ONDANSETRON HCL 4 MG PO TABS: 4.0000 mg | ORAL_TABLET | ORAL | Status: DC | PRN

## 2017-11-27 MED ORDER — TERBUTALINE SULFATE 1 MG/ML IJ SOLN
0.2500 mg | Freq: Once | INTRAMUSCULAR | Status: DC | PRN
  Filled 2017-11-27: qty 1

## 2017-11-27 MED ORDER — ONDANSETRON HCL 4 MG/2ML IJ SOLN: 4.0000 mg | Freq: Four times a day (QID) | INTRAMUSCULAR | Status: DC | PRN

## 2017-11-27 MED ORDER — DIPHENHYDRAMINE HCL 50 MG/ML IJ SOLN: 12.5000 mg | INTRAMUSCULAR | Status: DC | PRN

## 2017-11-27 MED ORDER — DIPHENHYDRAMINE HCL 25 MG PO CAPS: 25.0000 mg | ORAL_CAPSULE | Freq: Four times a day (QID) | ORAL | Status: DC | PRN

## 2017-11-27 MED ORDER — COCONUT OIL OIL: 1.0000 "application " | TOPICAL_OIL | Status: DC | PRN

## 2017-11-27 MED ORDER — SOD CITRATE-CITRIC ACID 500-334 MG/5ML PO SOLN: 30.0000 mL | ORAL | Status: DC | PRN

## 2017-11-27 MED ORDER — OXYCODONE HCL 5 MG PO TABS: 5.0000 mg | ORAL_TABLET | ORAL | Status: DC | PRN

## 2017-11-27 MED ORDER — WITCH HAZEL-GLYCERIN EX PADS: 1.0000 "application " | MEDICATED_PAD | CUTANEOUS | Status: DC | PRN

## 2017-11-27 MED ORDER — PHENYLEPHRINE 40 MCG/ML (10ML) SYRINGE FOR IV PUSH (FOR BLOOD PRESSURE SUPPORT)
80.0000 ug | PREFILLED_SYRINGE | INTRAVENOUS | Status: DC | PRN
  Filled 2017-11-27: qty 5

## 2017-11-27 MED ORDER — IBUPROFEN 600 MG PO TABS
600.0000 mg | ORAL_TABLET | Freq: Four times a day (QID) | ORAL | Status: DC
  Administered 2017-11-28 – 2017-11-29 (×6): 600 mg via ORAL
  Filled 2017-11-27 (×7): qty 1

## 2017-11-27 MED ORDER — OXYTOCIN 40 UNITS IN LACTATED RINGERS INFUSION - SIMPLE MED
2.5000 [IU]/h | INTRAVENOUS | Status: DC
  Filled 2017-11-27: qty 1000

## 2017-11-27 MED ORDER — FENTANYL 2.5 MCG/ML BUPIVACAINE 1/10 % EPIDURAL INFUSION (WH - ANES)
14.0000 mL/h | INTRAMUSCULAR | Status: DC | PRN
  Administered 2017-11-27: 14 mL/h via EPIDURAL
  Filled 2017-11-27: qty 100

## 2017-11-27 MED ORDER — EPHEDRINE 5 MG/ML INJ
10.0000 mg | INTRAVENOUS | Status: DC | PRN
  Filled 2017-11-27: qty 2

## 2017-11-27 MED ORDER — MISOPROSTOL 50MCG HALF TABLET
50.0000 ug | ORAL_TABLET | ORAL | Status: DC | PRN
  Administered 2017-11-27 (×2): 50 ug via BUCCAL
  Filled 2017-11-27 (×3): qty 1

## 2017-11-27 MED ORDER — SODIUM CHLORIDE 0.9 % IV SOLN
5.0000 10*6.[IU] | Freq: Once | INTRAVENOUS | Status: AC
  Administered 2017-11-27: 5 10*6.[IU] via INTRAVENOUS
  Filled 2017-11-27: qty 5

## 2017-11-27 MED ORDER — METFORMIN HCL 500 MG PO TABS
500.0000 mg | ORAL_TABLET | Freq: Once | ORAL | Status: AC
  Administered 2017-11-27: 500 mg via ORAL
  Filled 2017-11-27: qty 1

## 2017-11-27 MED ORDER — LACTATED RINGERS IV SOLN: 500.0000 mL | Freq: Once | INTRAVENOUS | Status: DC

## 2017-11-27 MED ORDER — OXYCODONE-ACETAMINOPHEN 5-325 MG PO TABS: 2.0000 | ORAL_TABLET | ORAL | Status: DC | PRN

## 2017-11-27 MED ORDER — PRENATAL MULTIVITAMIN CH
1.0000 | ORAL_TABLET | Freq: Every day | ORAL | Status: DC
  Administered 2017-11-28 – 2017-11-29 (×2): 1 via ORAL
  Filled 2017-11-27 (×2): qty 1

## 2017-11-27 MED ORDER — BETAMETHASONE SOD PHOS & ACET 6 (3-3) MG/ML IJ SUSP
12.0000 mg | INTRAMUSCULAR | Status: DC
  Administered 2017-11-27: 12 mg via INTRAMUSCULAR
  Filled 2017-11-27 (×2): qty 2

## 2017-11-27 MED ORDER — TETANUS-DIPHTH-ACELL PERTUSSIS 5-2.5-18.5 LF-MCG/0.5 IM SUSP: 0.5000 mL | Freq: Once | INTRAMUSCULAR | Status: DC

## 2017-11-27 MED ORDER — ZOLPIDEM TARTRATE 5 MG PO TABS: 5.0000 mg | ORAL_TABLET | Freq: Every evening | ORAL | Status: DC | PRN

## 2017-11-27 MED ORDER — BENZOCAINE-MENTHOL 20-0.5 % EX AERO: 1.0000 "application " | INHALATION_SPRAY | CUTANEOUS | Status: DC | PRN

## 2017-11-27 MED ORDER — LIDOCAINE HCL (PF) 1 % IJ SOLN
INTRAMUSCULAR | Status: DC | PRN
  Administered 2017-11-27: 7 mL via EPIDURAL

## 2017-11-27 MED ORDER — FENTANYL CITRATE (PF) 100 MCG/2ML IJ SOLN
100.0000 ug | INTRAMUSCULAR | Status: DC | PRN
  Administered 2017-11-27: 100 ug via INTRAVENOUS
  Filled 2017-11-27: qty 2

## 2017-11-27 MED ORDER — SENNOSIDES-DOCUSATE SODIUM 8.6-50 MG PO TABS
2.0000 | ORAL_TABLET | ORAL | Status: DC
  Administered 2017-11-28 (×2): 2 via ORAL
  Filled 2017-11-27 (×2): qty 2

## 2017-11-27 MED ORDER — SIMETHICONE 80 MG PO CHEW: 80.0000 mg | CHEWABLE_TABLET | ORAL | Status: DC | PRN

## 2017-11-27 MED ORDER — DIBUCAINE 1 % RE OINT: 1.0000 "application " | TOPICAL_OINTMENT | RECTAL | Status: DC | PRN

## 2017-11-27 MED ORDER — ACETAMINOPHEN 325 MG PO TABS: 650.0000 mg | ORAL_TABLET | ORAL | Status: DC | PRN

## 2017-11-27 MED ORDER — OXYCODONE-ACETAMINOPHEN 5-325 MG PO TABS: 1.0000 | ORAL_TABLET | ORAL | Status: DC | PRN

## 2017-11-27 MED ORDER — PENICILLIN G 3 MILLION UNITS IVPB - SIMPLE MED
3.0000 10*6.[IU] | INTRAVENOUS | Status: DC
  Administered 2017-11-27 (×2): 3 10*6.[IU] via INTRAVENOUS
  Filled 2017-11-27 (×2): qty 100
  Filled 2017-11-27 (×2): qty 3

## 2017-11-27 MED ORDER — LIDOCAINE HCL (PF) 1 % IJ SOLN
30.0000 mL | INTRAMUSCULAR | Status: DC | PRN
  Filled 2017-11-27: qty 30

## 2017-11-27 MED ORDER — LACTATED RINGERS IV SOLN
INTRAVENOUS | Status: DC
  Administered 2017-11-27 (×2): via INTRAVENOUS

## 2017-11-27 MED ORDER — ONDANSETRON HCL 4 MG/2ML IJ SOLN: 4.0000 mg | INTRAMUSCULAR | Status: DC | PRN

## 2017-11-27 MED ORDER — PHENYLEPHRINE 40 MCG/ML (10ML) SYRINGE FOR IV PUSH (FOR BLOOD PRESSURE SUPPORT)
80.0000 ug | PREFILLED_SYRINGE | INTRAVENOUS | Status: DC | PRN
  Filled 2017-11-27: qty 10
  Filled 2017-11-27: qty 5

## 2017-11-27 MED ORDER — OXYTOCIN BOLUS FROM INFUSION
500.0000 mL | Freq: Once | INTRAVENOUS | Status: AC
  Administered 2017-11-27: 500 mL via INTRAVENOUS

## 2017-11-27 NOTE — Anesthesia Pain Management Evaluation Note (Signed)
  CRNA Pain Management Visit Note  Patient: Tracey [name], 42 y.o., female  "Hello I am a member of the anesthesia team at [facility]. We have an anesthesia team available at all times to provide care throughout the hospital, including epidural management and anesthesia for C-section. I don't know your plan for the delivery whether it a natural birth, water birth, IV sedation, nitrous supplementation, doula or epidural, but we want to meet your pain goals."   1.Was your pain managed to your expectations on prior hospitalizations?   Yes   2.What is your expectation for pain management during this hospitalization?     IV pain meds, possible epidural  3.How can we help you reach that goal? Just received pain medication. Patient may ask for epidural  Record the patient's initial score and the patient's pain goal.   Pain: 3  Pain Goal: 5 [facility] wants you to be able to say your pain was always managed very well.  Bexton Haak 11/27/2017

## 2017-11-27 NOTE — Progress Notes (Signed)
Patient ID: Tracey [name], female   DOB: 01/17/1976, 42 y.o.   MRN: [id number]  Feeling some cramping since cytotec dose at 1100; RN unsure re presentation during exam  BP 127/67, P 81 FHR 140s, +accels, occ variables, +LTV Ctx irreg Cx 1/50/posterior per RN Bedside U/S: vtx  CBG: 107  IUP@36 .3wks PPROM cHTN GDMA2  Will give a second dose of buccal cytotec; hopeful to progress to Pit in 4hrs depending on exam  Cam Hai CNM 11/27/2017 3:53 PM

## 2017-11-27 NOTE — Anesthesia Procedure Notes (Signed)
Epidural Patient location during procedure: OB Start time: 11/27/2017 5:50 PM End time: 11/27/2017 6:03 PM  Staffing Anesthesiologist: Leilani Able, MD Performed: anesthesiologist   Preanesthetic Checklist Completed: patient identified, site marked, surgical consent, pre-op evaluation, timeout performed, IV checked, risks and benefits discussed and monitors and equipment checked  Epidural Patient position: sitting Prep: site prepped and draped and DuraPrep Patient monitoring: continuous pulse ox and blood pressure Approach: midline Location: L3-L4 Injection technique: LOR air  Needle:  Needle type: Tuohy  Needle gauge: 17 G Needle length: 9 cm and 9 Needle insertion depth: 7 cm Catheter type: closed end flexible Catheter size: 19 Gauge Catheter at skin depth: 12 cm Test dose: negative and Other  Assessment Sensory level: T10 Events: blood not aspirated, injection not painful, no injection resistance, negative IV test and no paresthesia

## 2017-11-27 NOTE — H&P (Signed)
Tracey [name] is a 42 y.o. female [phone] @ 36.3wks by LMP presenting for PPROM at 0100 on 9/10. Denies reg ctx or bldg; no H/A, N/V or visual disturbances. Her preg has been followed by the [facility] service and has been remarkable for 1) cHTN (no meds) 2) GDMA2 3) hx anxiety (no meds) 4) resolved oligohydramnios 5) GBS pos 6) AMA 7) 2nd preg IUGR. She was recently hospitalized from 8/23-25 on Antenatal for obs of oligo with AFI of 6-7cm at discharge. Most recent U/S shows EFW 50% and AFI 11cm.  OB History    Gravida  5   Para  2   Term  2   Preterm  0   AB  2   Living  2     SAB  2   TAB  0   Ectopic  0   Multiple  0   Live Births  2          Past Medical History:  Diagnosis Date  . Anxiety   . Bronchitis   . Carpal tunnel syndrome on both sides   . Depression   . Family history of adverse reaction to anesthesia    mother had problems waking up from surgery.  . Headache    migraines  . Hypertension   . Panic attack 2017   diagnosed 3 months ago. no meds presently  . Reflux    did not filll prescription   Past Surgical History:  Procedure Laterality Date  . BREAST BIOPSY  right    cyst  . DILATION AND EVACUATION N/A 07/26/2015   Procedure: DILATATION AND EVACUATION;  Surgeon: Brock Bad, MD;  Location: [facility];  Service: Gynecology;  Laterality: N/A;   Family History: family history includes Diabetes in her maternal aunt and maternal uncle; Hypertension in her mother. Social History:  reports that she has been smoking cigarettes. She has been smoking about 0.25 packs per day. She has never used smokeless tobacco. She reports that she does not drink alcohol or use drugs.     Maternal Diabetes: Yes:  Diabetes Type:  Insulin/Medication controlled Genetic Screening: Normal Maternal Ultrasounds/Referrals: Normal Fetal Ultrasounds or other Referrals:  None Maternal Substance Abuse:  No Significant Maternal Medications:  Meds include: Other:  Metformin Significant Maternal Lab Results:  Lab values include: Group B Strep positive Other Comments:  None  ROS History Dilation: 1 Effacement (%): Thick Exam by:: Amber Stovall RN Blood pressure 117/64, pulse 71, temperature 97.6 F (36.4 C), temperature source Oral, resp. rate 18, height 5\' 7"  (1.702 m), weight 93.4 kg, last menstrual period 03/17/2017, SpO2 100 %, unknown if currently breastfeeding. Exam Physical Exam  Constitutional: She is oriented to person, place, and time. She appears well-developed.  HENT:  Head: Normocephalic.  Neck: Normal range of motion.  Cardiovascular: Normal rate.  Respiratory: Effort normal.  GI:  EFM 140s, +accels, no decels Irreg, mild ctx  Musculoskeletal: Normal range of motion.  Neurological: She is alert and oriented to person, place, and time.  Skin: Skin is warm and dry.  Psychiatric: She has a normal mood and affect. Her behavior is normal. Thought content normal.    Prenatal labs: ABO, Rh: --/--/B POS (09/10 0701) Antibody: NEG (09/10 0701) Rubella: 3.63 (03/18 1319) RPR: Non Reactive (07/11 1108)  HBsAg: Negative (03/18 1319)  HIV: Non Reactive (07/11 1108)  GBS: Positive (08/27 1613)   Assessment/Plan: IUP@36 .3wks PPROM GBS pos cHTN GDMA2  Admit to [facility] for GBS ppx BMZ x  2 doses if able Plan cytotec to start for cx ripening; consider cervical foley/Pit Check CBGs Anticipate SVD   SHAW, KIMBERLY 11/27/2017, 9:52 AM

## 2017-11-27 NOTE — MAU Note (Signed)
Pt here with c/o contractions and some spotting. Been monitored for GDM, HTN, and oligo. Reports decreased fetal movement.

## 2017-11-27 NOTE — Anesthesia Preprocedure Evaluation (Signed)
Anesthesia Evaluation  Patient identified by MRN, date of birth, ID band Patient awake    Reviewed: Allergy & Precautions, H&P , NPO status , Patient's Chart, lab work & pertinent test results  Airway Mallampati: I  TM Distance: >3 FB Neck ROM: full    Dental no notable dental hx. (+) Teeth Intact   Pulmonary Current Smoker,    Pulmonary exam normal breath sounds clear to auscultation       Cardiovascular hypertension, Pt. on medications Normal cardiovascular exam Rhythm:regular Rate:Normal     Neuro/Psych    GI/Hepatic negative GI ROS, Neg liver ROS,   Endo/Other  negative endocrine ROSdiabetes, Gestational, Oral Hypoglycemic Agents  Renal/GU negative Renal ROS  negative genitourinary   Musculoskeletal negative musculoskeletal ROS (+)   Abdominal (+) + obese,   Peds  Hematology negative hematology ROS (+)   Anesthesia Other Findings   Reproductive/Obstetrics (+) Pregnancy                             Anesthesia Physical Anesthesia Plan  ASA: II  Anesthesia Plan: Epidural   Post-op Pain Management:    Induction:   PONV Risk Score and Plan:   Airway Management Planned:   Additional Equipment:   Intra-op Plan:   Post-operative Plan:   Informed Consent: I have reviewed the patients History and Physical, chart, labs and discussed the procedure including the risks, benefits and alternatives for the proposed anesthesia with the patient or authorized representative who has indicated his/her understanding and acceptance.     Plan Discussed with:   Anesthesia Plan Comments:         Anesthesia Quick Evaluation

## 2017-11-28 ENCOUNTER — Encounter: Payer: 59 | Admitting: Obstetrics & Gynecology

## 2017-11-28 LAB — RPR: RPR Ser Ql: NONREACTIVE

## 2017-11-28 LAB — GLUCOSE, CAPILLARY: Glucose-Capillary: 130 mg/dL — ABNORMAL HIGH (ref 70–99)

## 2017-11-28 NOTE — Progress Notes (Signed)
Post Partum Day 1 Subjective: Tracey [name] is a 42 year old [id number] female who is postpartum day one. She states that she is doing "excellent". She states she has been up out of bed today. She denies having a bowel movement or flatus. She has not eaten yet but is planning to order breakfast. She states that her bleeding is less than earlier and has had the same pad on since yesterday. She denies nausea, vomiting, headache, visual changes, RUQ pain. She is currently breast feeding and plans to use ParaGard for contraception.   Objective: Blood pressure 129/61, pulse 70, temperature 97.6 F (36.4 C), temperature source Oral, resp. rate 18, height 5\' 7"  (1.702 m), weight 93.4 kg, last menstrual period 03/17/2017, SpO2 100 %, unknown if currently breastfeeding.  Physical Exam:  General: Tracey [name] is well appearing and has a joyful tone. She answers questions appropriately and maintains good eye contact throughout the encounter.  Heart: RRR, no murmurs or gallops Lungs: CTA, no wheezes or crackles Uterine Fundus: Palpable, firm DVT Evaluation: No notable edema on bilateral lower extremities. No pain with bilateral foot dorsiflexion.   Recent Labs    11/27/17 0701  HGB 11.6*  HCT 35.5*    Assessment/Plan: Tracey [name] is a postpartum day one female who appears to be in stable condition.  Plan is to continue to monitor for any signs of hemorrhage and follow up after her meal to determine if she has a bowel movement of flatus.   LOS: 1 day   Tracey [name] 11/28/2017, 9:14 AM

## 2017-11-28 NOTE — Anesthesia Postprocedure Evaluation (Signed)
Anesthesia Post Note  Patient: Tyishia Gilvin  Procedure(s) Performed: AN AD HOC LABOR EPIDURAL     Patient location during evaluation: Mother Baby Anesthesia Type: Epidural Level of consciousness: awake and alert Pain management: pain level controlled Vital Signs Assessment: post-procedure vital signs reviewed and stable Respiratory status: spontaneous breathing, nonlabored ventilation and respiratory function stable Cardiovascular status: stable Postop Assessment: no headache, no backache, epidural receding, patient able to bend at knees, no apparent nausea or vomiting and adequate PO intake Anesthetic complications: no    Last Vitals:  Vitals:   11/28/17 0027 11/28/17 0404  BP: 132/63 129/61  Pulse:  70  Resp:  18  Temp:  36.4 C  SpO2:  100%    Last Pain:  Vitals:   11/28/17 0600  TempSrc:   PainSc: 2    Pain Goal:                 Kairyn Olmeda

## 2017-11-28 NOTE — Lactation Note (Signed)
This note was copied from a baby's chart. Lactation Consultation Note  Patient Name: Girl Lolitha Frana SRPRX'Y Date: 11/28/2017 Reason for consult: Follow-up assessment;Late-preterm 34-36.6wks;Infant < 6lbs  Baby is 16 hours old.  LC reviewed and updated the doc flow sheets per mom.  Per mom the baby had a wet and a black stool at 6 am that wasn't charted.  The LC charted per mom, 2 wets, and 1 stool. Last fed at 1230 6 ml from a bottle.  Mom encouraged to call with feeding cues for feeding assessment.  Mom asked to review hand expressing, when [facility] was assisting mom and reviewing, noted areola edema,  LC instructed mom on the use shells between feedings except when sleeping.  Mother informed of post-discharge support and given phone number to the lactation department, including services for phone call assistance; out-patient appointments; and breastfeeding support group. List of other breastfeeding resources in the community given in the handout. Encouraged mother to call for problems or concerns related to breastfeeding.  Maternal Data Has patient been taught Hand Expression?: Yes  Feeding Feeding Type: (last fed at 1230 ) Nipple Type: Slow - flow Length of feed: 15 min  LATCH Score                   Interventions Interventions: Breast feeding basics reviewed;Skin to skin  Lactation Tools Discussed/Used Tools: Pump Breast pump type: Double-Electric Breast Pump   Consult Status Consult Status: Follow-up Date: 11/29/17 Follow-up type: In-patient    Matilde Sprang Raynaldo Falco 11/28/2017, 1:29 PM

## 2017-11-28 NOTE — Lactation Note (Signed)
This note was copied from a baby's chart. Lactation Consultation Note  Patient Name: Tracey [name] [id number] Date: 11/28/2017 Reason for consult: Initial assessment;Late-preterm 34-36.6wks P3, LPTI, 363/[redacted] weeks gestation, mom w/ hx CHTN. Per mom, receives [facility] in [employer]. Per mom, has DEBP Motif. Mom BF her eldest daughter for 3 months stopped due low milk supply. LC asked mom demonstrate hand expression, no colostrum was present at this time.  Encourage mom continue with STS,  Mom attempted latch infant to breast, infant would not sustain latch, mom has short shafted nipples. LC discussed infant behaviors and feeding of LPTI, mom will not exceed 3 hours w/out feeding infant, and feeding duration 30 mins. or less.  Mom will continue BF, then give back EBM if present and supplement with formula. Mom aware of formula risk and infant was given 8 ml of Similac Neosure 22kcal on curve tip syringe. Mom will BF according hunger cues, 8 to 12 times within 24 hours including nights. LC discussed I&O. DEBP was set up and explained by RN Reviewed Baby & Me book's Breastfeeding Basics.  LC discussed : LC outpatient, BF support group, LC hotline and BF local [employer].   Maternal Data Formula Feeding for Exclusion: No Has patient been taught Hand Expression?: Yes Does the patient have breastfeeding experience prior to this delivery?: Yes  Feeding Feeding Type: Formula Nipple Type: Other Length of feed: 4 min  LATCH Score Latch: Repeated attempts needed to sustain latch, nipple held in mouth throughout feeding, stimulation needed to elicit sucking reflex.  Audible Swallowing: None  Type of Nipple: Flat(short shaft)  Comfort (Breast/Nipple): Soft / non-tender  Hold (Positioning): Assistance needed to correctly position infant at breast and maintain latch.  LATCH Score: 5  Interventions Interventions: Breast feeding basics reviewed;Support pillows;Assisted with  latch;Breast massage;Skin to skin;Breast compression;DEBP;Adjust position  Lactation Tools Discussed/Used WIC Program: Yes Pump Review: Setup, frequency, and cleaning;Milk Storage Initiated by:: by RN Date initiated:: 11/28/17   Consult Status Consult Status: Follow-up Date: 11/28/17 Follow-up type: In-patient    Danelle Earthly 11/28/2017, 3:36 AM

## 2017-11-28 NOTE — Progress Notes (Signed)
MOB was referred for history of depression/anxiety. * Referral screened out by Clinical Social Worker because none of the following criteria appear to apply: ~ History of anxiety/depression during this pregnancy, or of post-partum depression following prior delivery. ~ Diagnosis of anxiety and/or depression within last 3 years OR * MOB's symptoms currently being treated with medication and/or therapy. Please contact the Clinical Social Worker if needs arise, by MOB request, or if MOB scores greater than 9/yes to question 10 on [place] Postpartum Depression Screen.  Britiney Blahnik Boyd-Gilyard, MSW, LCSW Clinical Social Work [phone]  

## 2017-11-29 ENCOUNTER — Ambulatory Visit: Payer: Self-pay

## 2017-11-29 NOTE — Lactation Note (Signed)
This note was copied from a baby's chart. Lactation Consultation Note  Patient Name: Tracey [name] Reason for consult: Follow-up assessment;Late-preterm 34-36.6wks;Infant < 6lbs  Baby is 4243 hours old.  LC updated the doc flow sheets per mom.  Per mom baby recently fed for 15 mins and baby was swallowing a lot and only took 2 ml   Of the formula.  [facility] and recommended to continue feeding at the breast and supplementing  Afterwards, work on increasing the supplement to at least 20 ml. Post pump afterwards.  Per mom has only pumped x 1 and has been letting the baby be the pump .  LC explained why the post pumping is a [facility] and only do it when the baby isn't  Cluster feeding.  LC praised mom for her breast feeding efforts thus far, and encouraged to continue.     Maternal Data    Feeding Feeding Type: (baby last fed at 1400 ) Length of feed: 15 min(per mom )  LATCH Score                   Interventions Interventions: Breast feeding basics reviewed  Lactation Tools Discussed/Used Tools: Shells;Pump Shell Type: Inverted Breast pump type: Double-Electric Breast Pump   Consult Status Consult Status: Follow-up Date: 11/30/17 Follow-up type: In-patient    Tracey [name] [name], 3:33 PM

## 2017-11-29 NOTE — Discharge Summary (Addendum)
OB Discharge Summary     Patient Name: Tracey [name] DOB: 08-Jun-1975 MRN: [id number]  Date of admission: 11/27/2017 Delivering MD: Sharene Butters D   Date of discharge: 11/29/2017  Admitting diagnosis: 24WKS CTX AND BLEEDING Intrauterine pregnancy: [redacted]w[redacted]d    Secondary diagnosis:  Principal Problem:   PROM (premature rupture of membranes) Active Problems:   Chronic hypertension during pregnancy, antepartum   Supervision of high risk pregnancy, antepartum   History of prior pregnancy with IUGR newborn   Vitamin D deficiency   AMA (advanced maternal age) multigravida 35+   Carpal tunnel syndrome during pregnancy   Heartburn during pregnancy, antepartum   GDM (gestational diabetes mellitus)   Indication for care in labor or delivery   Anxiety  Additional problems: Preterm premature rupture of membranes     Discharge diagnosis: Preterm Pregnancy Delivered                                                                                                Post partum procedures:none  Augmentation: Cytotec  Complications: None  Hospital course:  Onset of Labor With Vaginal Delivery     42y.o. yo [id number] 40w3das admitted in Latent Labor on 11/27/2017. Patient had an uncomplicated labor course as follows:  Membrane Rupture Time/Date: 12:00 AM ,11/27/2017   Intrapartum Procedures: Episiotomy: None [1]                                         Lacerations:  None [1]  Patient had a delivery of a Viable infant. 11/27/2017  Information for the patient's newborn:  PrMallorie, Norrodirl [email or web address][[id number]]Delivery Method: Vaginal, Spontaneous(Filed from Delivery Summary)    Pateint had an uncomplicated postpartum course.  She is ambulating, tolerating a regular diet, passing flatus, and urinating well. Patient is discharged home in stable condition on 11/29/17.   Physical exam  Vitals:   11/28/17 0027 11/28/17 0404 11/28/17 1515 11/28/17 2303  BP: 132/63 129/61 117/84 (!) 153/69  Pulse:  70 62  73  Resp:  '18 18 16  '$ Temp:  97.6 F (36.4 C) 98.1 F (36.7 C) 98.7 F (37.1 C)  TempSrc:  Oral Oral Oral  SpO2:  100%  99%  Weight:      Height:       General: alert, cooperative and no distress Lochia: appropriate Uterine Fundus: firm Incision: N/A DVT Evaluation: No evidence of DVT seen on physical exam. Labs: Lab Results  Component Value Date   WBC 8.3 11/27/2017   HGB 11.6 (L) 11/27/2017   HCT 35.5 (L) 11/27/2017   MCV 87.7 11/27/2017   PLT 279 11/27/2017   CMP Latest Ref Rng & Units 10/29/2017  Glucose 70 - 99 mg/dL 99  BUN 6 - 20 mg/dL 10  Creatinine 0.44 - 1.00 mg/dL 0.71  Sodium 135 - 145 mmol/L 131(L)  Potassium 3.5 - 5.1 mmol/L 3.6  Chloride 98 - 111 mmol/L 104  CO2 22 - 32 mmol/L 17(L)  Calcium 8.9 - 10.3  mg/dL 8.8(L)  Total Protein 6.5 - 8.1 g/dL 6.3(L)  Total Bilirubin 0.3 - 1.2 mg/dL 0.6  Alkaline Phos 38 - 126 U/L 121  AST 15 - 41 U/L 16  ALT 0 - 44 U/L 16    Discharge instruction: per After Visit Summary and "Baby and Me Booklet".  After visit meds:  Allergies as of 11/29/2017   No Known Allergies     Medication List    STOP taking these medications   ACCU-CHEK FASTCLIX LANCETS Misc   ACCU-CHEK NANO SMARTVIEW w/Device Kit   aspirin 81 MG chewable tablet   COMFORT FIT MATERNITY SUPP SM Misc   glucose blood test strip   metFORMIN 500 MG tablet Commonly known as:  GLUCOPHAGE     TAKE these medications   pantoprazole 40 MG tablet Commonly known as:  PROTONIX Take 1 tablet (40 mg total) by mouth daily.   prenatal vitamin w/FE, FA 27-1 MG Tabs tablet Take 1 tablet by mouth daily before breakfast.   PROBIOTIC DAILY PO Take 1 tablet by mouth daily.   Vitamin D (Ergocalciferol) 50000 units Caps capsule Commonly known as:  DRISDOL Take 1 capsule (50,000 Units total) by mouth every 7 (seven) days.       Diet: routine diet  Activity: Advance as tolerated. Pelvic rest for 6 weeks.   Outpatient follow up:6 weeks Follow up  Appt: Future Appointments  Date Time Provider [place]  12/05/2017  1:15 PM [place] None  01/09/2018  8:30 AM [facility] None  01/09/2018  8:45 AM [place] Mode, MD [facility] None   Follow up Visit:No follow-ups on file.  Postpartum contraception: IUD Paragard  Newborn Data: Live born female  Birth Weight: 5 lb 0.6 oz (2285 g) APGAR: 8, 9  Newborn Delivery   Birth date/time:  11/27/2017 20:30:00 Delivery type:  Vaginal, Spontaneous     Baby Feeding: Bottle and Breast Disposition:home with mother   11/29/2017 Tracey Haymaker, MD  I confirm that I have verified the information documented in the resident's note and that I have also personally reperformed the physical exam and all medical decision making activities.  Tracey [name], CNM 11/29/17

## 2017-11-29 NOTE — Discharge Instructions (Signed)
Vaginal Delivery, Care After °Refer to this sheet in the next few weeks. These instructions provide you with information about caring for yourself after vaginal delivery. Your health care provider may also give you more specific instructions. Your treatment has been planned according to current medical practices, but problems sometimes occur. Call your health care provider if you have any problems or questions. °What can I expect after the procedure? °After vaginal delivery, it is common to have: °· Some bleeding from your vagina. °· Soreness in your abdomen, your vagina, and the area of skin between your vaginal opening and your anus (perineum). °· Pelvic cramps. °· Fatigue. ° °Follow these instructions at home: °Medicines °· Take over-the-counter and prescription medicines only as told by your health care provider. °· If you were prescribed an antibiotic medicine, take it as told by your health care provider. Do not stop taking the antibiotic until it is finished. °Driving ° °· Do not drive or operate heavy machinery while taking prescription pain medicine. °· Do not drive for 24 hours if you received a sedative. °Lifestyle °· Do not drink alcohol. This is especially important if you are breastfeeding or taking medicine to relieve pain. °· Do not use tobacco products, including cigarettes, chewing tobacco, or e-cigarettes. If you need help quitting, ask your health care provider. °Eating and drinking °· Drink at least 8 eight-ounce glasses of water every day unless you are told not to by your health care provider. If you choose to breastfeed your baby, you may need to drink more water than this. °· Eat high-fiber foods every day. These foods may help prevent or relieve constipation. High-fiber foods include: °? Whole grain cereals and breads. °? Brown rice. °? Beans. °? Fresh fruits and vegetables. °Activity °· Return to your normal activities as told by your health care provider. Ask your health care provider  what activities are safe for you. °· Rest as much as possible. Try to rest or take a nap when your baby is sleeping. °· Do not lift anything that is heavier than your baby or 10 lb (4.5 kg) until your health care provider says that it is safe. °· Talk with your health care provider about when you can engage in sexual activity. This may depend on your: °? Risk of infection. °? Rate of healing. °? Comfort and desire to engage in sexual activity. °Vaginal Care °· If you have an episiotomy or a vaginal tear, check the area every day for signs of infection. Check for: °? More redness, swelling, or pain. °? More fluid or blood. °? Warmth. °? Pus or a bad smell. °· Do not use tampons or douches until your health care provider says this is safe. °· Watch for any blood clots that may pass from your vagina. These may look like clumps of dark red, brown, or black discharge. °General instructions °· Keep your perineum clean and dry as told by your health care provider. °· Wear loose, comfortable clothing. °· Wipe from front to back when you use the toilet. °· Ask your health care provider if you can shower or take a bath. If you had an episiotomy or a perineal tear during labor and delivery, your health care provider may tell you not to take baths for a certain length of time. °· Wear a bra that supports your breasts and fits you well. °· If possible, have someone help you with household activities and help care for your baby for at least a few days after   you leave the hospital. °· Keep all follow-up visits for you and your baby as told by your health care provider. This is important. °Contact a health care provider if: °· You have: °? Vaginal discharge that has a bad smell. °? Difficulty urinating. °? Pain when urinating. °? A sudden increase or decrease in the frequency of your bowel movements. °? More redness, swelling, or pain around your episiotomy or vaginal tear. °? More fluid or blood coming from your episiotomy or  vaginal tear. °? Pus or a bad smell coming from your episiotomy or vaginal tear. °? A fever. °? A rash. °? Little or no interest in activities you used to enjoy. °? Questions about caring for yourself or your baby. °· Your episiotomy or vaginal tear feels warm to the touch. °· Your episiotomy or vaginal tear is separating or does not appear to be healing. °· Your breasts are painful, hard, or turn red. °· You feel unusually sad or worried. °· You feel nauseous or you vomit. °· You pass large blood clots from your vagina. If you pass a blood clot from your vagina, save it to show to your health care provider. Do not flush blood clots down the toilet without having your health care provider look at them. °· You urinate more than usual. °· You are dizzy or light-headed. °· You have not breastfed at all and you have not had a menstrual period for 12 weeks after delivery. °· You have stopped breastfeeding and you have not had a menstrual period for 12 weeks after you stopped breastfeeding. °Get help right away if: °· You have: °? Pain that does not go away or does not get better with medicine. °? Chest pain. °? Difficulty breathing. °? Blurred vision or spots in your vision. °? Thoughts about hurting yourself or your baby. °· You develop pain in your abdomen or in one of your legs. °· You develop a severe headache. °· You faint. °· You bleed from your vagina so much that you fill two sanitary pads in one hour. °This information is not intended to replace advice given to you by your health care provider. Make sure you discuss any questions you have with your health care provider. °Document Released: 03/03/2000 Document Revised: 08/18/2015 Document Reviewed: 03/21/2015 °Elsevier Interactive Patient Education © 2018 Elsevier Inc. ° °

## 2017-11-30 ENCOUNTER — Ambulatory Visit (HOSPITAL_COMMUNITY): Payer: 59

## 2017-11-30 ENCOUNTER — Ambulatory Visit: Payer: Self-pay

## 2017-11-30 NOTE — Lactation Note (Signed)
This note was copied from a baby's chart. Lactation Consultation Note  Patient Name: Tracey [name] [id number]'XToday's Date: 11/30/2017 Reason for consult: Follow-up assessment;Late-preterm 34-36.6wks;Infant < 6lbs Baby  Is 62 hours old  LC reviewed the doc flow sheets, and updated per mom LC praised mom for her breast  Feeding efforts and following the  LPT plan.  Per mom I think my milk is in, and [facility]. Breast full , not engorged. And  Mom denies nipple soreness.  Sore nipple and engorgement prevention and tx reviewed.  Per mom will have a DEBP at home.  LC recommended since the baby is less than 6 pounds, LPT,  To feed the baby 1st breast 15 -20 mins , 30 mins max and supplement  Back with at least 30 ml of EBM, if not available formula.  And post pump breast for 10 -15 mins , save milk for the next feeding.  Discussed the importance of STS feedings until the baby is gaining steadily,  And the weight is back to birth, and can stay awake for a feeding.  Discussed nutritive vs non - nutritive feeding patterns and the importance of  Watching of hanging out latched.  Mom concerned due to her 2nd baby experience breast feeding and at 4 moths  Her milk dried up. Mom couldn't remember if she had problems with engorgement .  LC offered to request an LC O/P appt. [facility] Thursday or Friday. Mom receptive to a LC O/P appt.  Mother informed of post-discharge support and given phone number to the lactation department, including services for phone call assistance; out-patient appointments; and breastfeeding support group. List of other breastfeeding resources in the community given in the handout. Encouraged mother to call for problems or concerns related to breastfeeding.  Maternal Data    Feeding Feeding Type: Breast Milk Nipple Type: Slow - flow Length of feed: 30 min  LATCH Score -  Latch score done by the  [facility] )  Latch: Grasps breast easily, tongue down, lips flanged, rhythmical sucking.  Audible Swallowing: A few with stimulation  Type of Nipple: Everted at rest and after stimulation  Comfort (Breast/Nipple): Soft / non-tender  Hold (Positioning): No assistance needed to correctly position infant at breast.  LATCH Score: 9  Interventions Interventions: Breast feeding basics reviewed  Lactation Tools Discussed/Used Tools: Shells   Consult Status Consult Status: Follow-up Date: (LC placed a request for the clinic to call mom ) Follow-up type: Out-patient    Tracey [name] 11/30/2017, 11:21 AM

## 2017-12-05 ENCOUNTER — Ambulatory Visit (INDEPENDENT_AMBULATORY_CARE_PROVIDER_SITE_OTHER): Payer: 59 | Admitting: Obstetrics

## 2017-12-05 VITALS — BP 161/99 | HR 69 | Wt 197.1 lb

## 2017-12-05 DIAGNOSIS — O165 Unspecified maternal hypertension, complicating the puerperium: Secondary | ICD-10-CM

## 2017-12-05 MED ORDER — AMLODIPINE BESYLATE 10 MG PO TABS: 10.0000 mg | ORAL_TABLET | Freq: Every day | ORAL | 5 refills | Status: DC

## 2017-12-05 NOTE — Progress Notes (Signed)
Subjective:  Tracey BiddingCindy Stampley is a 42 y.o. female here for BP check.   Hypertension ROS: taking medications as instructed, no medication side effects noted, no TIA's, no chest pain on exertion, no dyspnea on exertion, no swelling of ankles and noting swelling of ankles.    Objective:  BP (!) 161/99 (BP Location: Left Arm, Cuff Size: Normal)   Pulse 69   Wt 197 lb 1.6 oz (89.4 kg)   BMI 30.87 kg/m   Appearance alert, well appearing, and in no distress. General exam BP noted to be well controlled today in office.    Assessment:   Blood Pressure asymptomatic.   Plan:  RX prescribed by Dr. Clearance CootsHarper and to return in 2 days for BP Check.  Please schedule PP visit   I have reviewed the chart and agree with nursing staff's documentation of this patient's encounter.  Coral Ceoharles Harper, MD 12/05/2017 2:25 PM

## 2017-12-06 ENCOUNTER — Encounter (HOSPITAL_COMMUNITY): Payer: Self-pay | Admitting: *Deleted

## 2017-12-06 ENCOUNTER — Telehealth: Payer: Self-pay

## 2017-12-06 ENCOUNTER — Inpatient Hospital Stay (HOSPITAL_COMMUNITY)
Admission: AD | Admit: 2017-12-06 | Discharge: 2017-12-06 | Disposition: A | Discharge status: Home / Self Care | Payer: 59 | Source: Ambulatory Visit | Attending: Obstetrics and Gynecology | Admitting: Obstetrics and Gynecology

## 2017-12-06 DIAGNOSIS — O1003 Pre-existing essential hypertension complicating the puerperium: Secondary | ICD-10-CM | POA: Insufficient documentation

## 2017-12-06 DIAGNOSIS — O99335 Smoking (tobacco) complicating the puerperium: Secondary | ICD-10-CM | POA: Diagnosis not present

## 2017-12-06 DIAGNOSIS — F17210 Nicotine dependence, cigarettes, uncomplicated: Secondary | ICD-10-CM | POA: Diagnosis not present

## 2017-12-06 DIAGNOSIS — I10 Essential (primary) hypertension: Secondary | ICD-10-CM

## 2017-12-06 DIAGNOSIS — R030 Elevated blood-pressure reading, without diagnosis of hypertension: Secondary | ICD-10-CM | POA: Diagnosis present

## 2017-12-06 LAB — PROTEIN / CREATININE RATIO, URINE
CREATININE, URINE: 107 mg/dL
Protein Creatinine Ratio: 0.09 mg/mg{Cre} (ref 0.00–0.15)
Total Protein, Urine: 10 mg/dL

## 2017-12-06 LAB — COMPREHENSIVE METABOLIC PANEL
ALBUMIN: 3.3 g/dL — AB (ref 3.5–5.0)
ALK PHOS: 106 U/L (ref 38–126)
ALT: 22 U/L (ref 0–44)
ANION GAP: 6 (ref 5–15)
AST: 15 U/L (ref 15–41)
BILIRUBIN TOTAL: 0.6 mg/dL (ref 0.3–1.2)
BUN: 12 mg/dL (ref 6–20)
CALCIUM: 8.7 mg/dL — AB (ref 8.9–10.3)
CO2: 24 mmol/L (ref 22–32)
Chloride: 108 mmol/L (ref 98–111)
Creatinine, Ser: 0.93 mg/dL (ref 0.44–1.00)
GFR calc Af Amer: >60 mL/min (ref >60)
GLUCOSE: 99 mg/dL (ref 70–99)
POTASSIUM: 3.9 mmol/L (ref 3.5–5.1)
Sodium: 138 mmol/L (ref 135–145)
TOTAL PROTEIN: 6.4 g/dL — AB (ref 6.5–8.1)

## 2017-12-06 LAB — CBC
HEMATOCRIT: 37.7 % (ref 36.0–46.0)
HEMOGLOBIN: 12.1 g/dL (ref 12.0–15.0)
MCH: 28.4 pg (ref 26.0–34.0)
MCHC: 32.1 g/dL (ref 30.0–36.0)
MCV: 88.5 fL (ref 78.0–100.0)
Platelets: 336 10*3/uL (ref 150–400)
RBC: 4.26 MIL/uL (ref 3.87–5.11)
RDW: 15 % (ref 11.5–15.5)
WBC: 5.4 10*3/uL (ref 4.0–10.5)

## 2017-12-06 MED ORDER — OXYCODONE HCL 5 MG PO TABS
5.0000 mg | ORAL_TABLET | Freq: Once | ORAL | Status: AC
  Administered 2017-12-06: 5 mg via ORAL
  Filled 2017-12-06: qty 1

## 2017-12-06 MED ORDER — AMLODIPINE BESYLATE 10 MG PO TABS
10.0000 mg | ORAL_TABLET | Freq: Every day | ORAL | Status: DC
  Administered 2017-12-06: 10 mg via ORAL
  Filled 2017-12-06: qty 1

## 2017-12-06 MED ORDER — LISINOPRIL 10 MG PO TABS: 10.0000 mg | ORAL_TABLET | Freq: Every day | ORAL | 0 refills | Status: DC

## 2017-12-06 NOTE — Telephone Encounter (Signed)
Return call to pt regarding Headache management medication to take w/ Blood Pressure medication  Pt was seen in MAU today Pt has B/P check tomorrow in office Pt advised to keep appt  However I would ask a provider about HA medication.  Pt asked could she by chance take IBP ?or can Rx be sent that is safe to take with her recently new prescribed B/P Rx.

## 2017-12-06 NOTE — MAU Note (Signed)
PT SAYS SHE DEL ON 11-27-2017- VAG - .   WITH DR Adrian BlackwaterSTINSON.  BREAST/ BOTTLE FEDING.    D/C ON  11-30-2017-   ALL GOOD.  FEET STARTED SWELLING  ON 14TH.    [facility] DR HARPER.   GAVE RX  FOR BP MEDS- AMLODIPINE 10MG  DAILY .Marland Kitchen.    SHE TOOK 1 PILL YESTERDAY AT 11AM  THEN - H/A STARTED AT 5 PM- .   TOOK 500 MG  TYLENOL AT 9PM- NO RELIEF  THEN AT 0400- H/A - TOOK ANOTHER 500MG  TYLENOL .

## 2017-12-06 NOTE — Discharge Instructions (Signed)
Postpartum Hypertension °Postpartum hypertension is high blood pressure after pregnancy that remains higher than normal for more than two days after delivery. You may not realize that you have postpartum hypertension if your blood pressure is not being checked regularly. In some cases, postpartum hypertension will go away on its own, usually within a week of delivery. However, for some women, medical treatment is required to prevent serious complications, such as seizures or stroke. °The following things can affect your blood pressure: °· The type of delivery you had. °· Having received IV fluids or other medicines during or after delivery. ° °What are the causes? °Postpartum hypertension may be caused by any of the following or by a combination of any of the following: °· Hypertension that existed before pregnancy (chronic hypertension). °· Gestational hypertension. °· Preeclampsia or eclampsia. °· Receiving a lot of fluid through an IV during or after delivery. °· Medicines. °· HELLP syndrome. °· Hyperthyroidism. °· Stroke. °· Other rare neurological or blood disorders. ° °In some cases, the cause may not be known. °What increases the risk? °Postpartum hypertension can be related to one or more risk factors, such as: °· Chronic hypertension. In some cases, this may not have been diagnosed before pregnancy. °· Obesity. °· Type 2 diabetes. °· Kidney disease. °· Family history of preeclampsia. °· Other medical conditions that cause hormonal imbalances. ° °What are the signs or symptoms? °As with all types of hypertension, postpartum hypertension may not have any symptoms. Depending on how high your blood pressure is, you may experience: °· Headaches. These may be mild, moderate, or severe. They may also be steady, constant, or sudden in onset (thunderclap headache). °· Visual changes. °· Dizziness. °· Shortness of breath. °· Swelling of your hands, feet, lower legs, or face. In some cases, you may have swelling in  more than one of these locations. °· Heart palpitations or a racing heartbeat. °· Difficulty breathing while lying down. °· Decreased urination. ° °Other rare signs and symptoms may include: °· Sweating more than usual. This lasts longer than a few days after delivery. °· Chest pain. °· Sudden dizziness when you get up from sitting or lying down. °· Seizures. °· Nausea or vomiting. °· Abdominal pain. ° °How is this diagnosed? °The diagnosis of postpartum hypertension is made through a combination of physical examination findings and testing of your blood and urine. You may also have additional tests, such as a CT scan or an MRI, to check for other complications of postpartum hypertension. °How is this treated? °When blood pressure is high enough to require treatment, your options may include: °· Medicines to reduce blood pressure (antihypertensives). Tell your health care provider if you are breastfeeding or if you plan to breastfeed. There are many antihypertensive medicines that are safe to take while breastfeeding. °· Stopping medicines that may be causing hypertension. °· Treating medical conditions that are causing hypertension. °· Treating the complications of hypertension, such as seizures, stroke, or kidney problems. ° °Your health care provider will also continue to monitor your blood pressure closely and repeatedly until it is within a safe range for you. °Follow these instructions at home: °· Take medicines only as directed by your health care provider. °· Get regular exercise after your health care provider tells you that it is safe. °· Follow your health care provider’s recommendations on fluid and salt restrictions. °· Do not use any tobacco products, including cigarettes, chewing tobacco, or electronic cigarettes. If you need help quitting, ask your health care provider. °·   Keep all follow-up visits as directed by your health care provider. This is important. °Contact a health care provider  if: °· Your symptoms get worse. °· You have new symptoms, such as: °? Headache. °? Dizziness. °? Visual changes. °Get help right away if: °· You develop a severe or sudden headache. °· You have seizures. °· You develop numbness or weakness on one side of your body. °· You have difficulty thinking, speaking, or swallowing. °· You develop severe abdominal pain. °· You develop difficulty breathing, chest pain, a racing heartbeat, or heart palpitations. °These symptoms may represent a serious problem that is an emergency. Do not wait to see if the symptoms will go away. Get medical help right away. Call your local emergency services (911 in the [place] Do not drive yourself to the hospital. °This information is not intended to replace advice given to you by your health care provider. Make sure you discuss any questions you have with your health care provider. °Document Released: 11/07/2013 Document Revised: 08/09/2015 Document Reviewed: 09/18/2013 °Elsevier Interactive Patient Education © 2018 Elsevier Inc. ° °

## 2017-12-06 NOTE — MAU Provider Note (Addendum)
Chief Complaint:  No chief complaint on file.   First Provider Initiated Contact with Patient 12/06/17 0710      HPI: Tracey [name] is a 42 y.o. [id number] who presents to maternity admissions reporting headache and elevated blood pressures.  Has chronic hypertension but BPs have not been running this high. Took Tylenol twice without relief.  Some blurriness to vision.. She reports vaginal bleeding, vaginal itching/burning, urinary symptoms, h/a, dizziness, n/v, or fever/chills.    Delivered on 11/27/17 and was discharged home on 11/29/17.  Sent home on Norvasc which she has been taking  Hypertension  This is a recurrent problem. The current episode started today. The problem has been gradually worsening since onset. Associated symptoms include blurred vision and headaches. Pertinent negatives include no anxiety, chest pain, palpitations, peripheral edema or shortness of breath. There are no associated agents to hypertension. Past treatments include calcium channel blockers. The current treatment provides no improvement. There are no compliance problems.     Past Medical History: Past Medical History:  Diagnosis Date  . Anxiety   . Bronchitis   . Carpal tunnel syndrome on both sides   . Depression   . Family history of adverse reaction to anesthesia    mother had problems waking up from surgery.  . Headache    migraines  . Hypertension   . Oligohydramnios antepartum 11/09/2017  . Panic attack 2017   diagnosed 3 months ago. no meds presently  . Reflux    did not filll prescription    Past obstetric history: OB History  Gravida Para Term Preterm AB Living  5 3 2 1 2 3   SAB TAB Ectopic Multiple Live Births  2 0 0 0 3    # Outcome Date GA Lbr Len/2nd Weight Sex Delivery Anes PTL Lv  5 Preterm 11/27/17 [redacted]w[redacted]d 19:20 / 00:10 2285 g F Vag-Spont EPI  LIV  4 SAB 08/09/15 [redacted]w[redacted]d         3 Term 03/23/05 [redacted]w[redacted]d  1701 g F Vag-Spont   LIV     Complications: IUGR (intrauterine growth restriction)  affecting care of mother  2 SAB 2004          1 Term 08/06/96   3175 g M Vag-Spont   LIV    Past Surgical History: Past Surgical History:  Procedure Laterality Date  . BREAST BIOPSY  right    cyst  . DILATION AND EVACUATION N/A 07/26/2015   Procedure: DILATATION AND EVACUATION;  Surgeon: Brock Bad, MD;  Location: [facility];  Service: Gynecology;  Laterality: N/A;    Family History: Family History  Problem Relation Age of Onset  . Hypertension Mother   . Diabetes Maternal Aunt   . Diabetes Maternal Uncle     Social History: Social History   Tobacco Use  . Smoking status: Current Some Day Smoker    Packs/day: 0.25    Types: Cigarettes  . Smokeless tobacco: Never Used  . Tobacco comment: 2 cig/day  Substance Use Topics  . Alcohol use: No    Alcohol/week: 0.0 standard drinks  . Drug use: No    Allergies: No Known Allergies  Meds:  Medications Prior to Admission  Medication Sig Dispense Refill Last Dose  . amLODipine (NORVASC) 10 MG tablet Take 1 tablet (10 mg total) by mouth daily. 30 tablet 5 12/05/2017 at 1100  . pantoprazole (PROTONIX) 40 MG tablet Take 1 tablet (40 mg total) by mouth daily. 30 tablet 3 12/05/2017 at Unknown time  . prenatal  vitamin w/FE, FA (PRENATAL 1 + 1) 27-1 MG TABS tablet Take 1 tablet by mouth daily before breakfast. 30 each 11 12/05/2017 at Unknown time  . Vitamin D, Ergocalciferol, (DRISDOL) 50000 units CAPS capsule Take 1 capsule (50,000 Units total) by mouth every 7 (seven) days. 30 capsule 2 12/05/2017 at Unknown time  . Probiotic Product (PROBIOTIC DAILY PO) Take 1 tablet by mouth daily.    11/26/2017 at Unknown time    I have reviewed patient's Past Medical Hx, Surgical Hx, Family Hx, Social Hx, medications and allergies.  ROS:  Review of Systems  Eyes: Positive for blurred vision.  Respiratory: Negative for shortness of breath.   Cardiovascular: Negative for chest pain and palpitations.  Neurological: Positive for headaches.   Other  systems negative     Physical Exam   Patient Vitals for the past 24 hrs:  BP Temp Temp src Pulse Resp Height Weight  12/06/17 0704 (!) 158/98 - - (!) 58 - - -  12/06/17 0644 (!) 154/94 - - 63 - - -  12/06/17 0631 (!) 146/85 98.2 F (36.8 C) Oral 71 20 5\' 7"  (1.702 m) 87.9 kg   Constitutional: Well-developed, well-nourished female in no acute distress.  Cardiovascular: normal rate and rhythm, no ectopy audible, S1 & S2 heard, no murmur Respiratory: normal effort, no distress. Lungs CTAB with no wheezes or crackles GI: Abd soft, non-tender.  Nondistended.  No rebound, No guarding.  Bowel Sounds audible  MS: Extremities nontender, no edema, normal ROM Neurologic: Alert and oriented x 4.   Grossly nonfocal.  DTRs 2+3+ with no clonus GU: Neg CVAT. Skin:  Warm and Dry Psych:  Affect appropriate.  PELVIC EXAM: deferred    Labs: --/--/B POS (09/10 0701)  Imaging:    MAU Course/MDM: I have ordered labs as follows:  CBC, CMET, Protein/Creatinine Ratio Imaging ordered: none Results reviewed.     Assessment: Postpartum Day #9 Chronic hypertension Headache  Plan: Report given to oncoming provider  Wynelle Bourgeois CNM, MSN Certified Nurse-Midwife 12/06/2017 7:10 AM   MDM DAY SHIFT: --Headache resolved, patient sleeping upon my introduction in MAU --Denies visual disturbances --BP closer to baseline after Norvasc administered in MAU  Patient Vitals for the past 24 hrs:  BP Temp Temp src Pulse Resp Height Weight  12/06/17 0941 (!) 150/75 - - (!) 50 - - -  12/06/17 0716 (!) 147/77 - - (!) 56 - - -  12/06/17 0704 (!) 158/98 - - (!) 58 - - -  12/06/17 0644 (!) 154/94 - - 63 - - -  12/06/17 0631 (!) 146/85 98.2 F (36.8 C) Oral 71 20 5\' 7"  (1.702 m) 87.9 kg    Results for orders placed or performed during the hospital encounter of 12/06/17 (from the past 24 hour(s))  Protein / creatinine ratio, urine     Status: None   Collection Time: 12/06/17  6:44 AM  Result Value Ref  Range   Creatinine, Urine 107.00 mg/dL   Total Protein, Urine 10 mg/dL   Protein Creatinine Ratio 0.09 0.00 - 0.15 mg/mg[Cre]  CBC     Status: None   Collection Time: 12/06/17  6:53 AM  Result Value Ref Range   WBC 5.4 4.0 - 10.5 K/uL   RBC 4.26 3.87 - 5.11 MIL/uL   Hemoglobin 12.1 12.0 - 15.0 g/dL   HCT 16.1 09.6 - 04.5 %   MCV 88.5 78.0 - 100.0 fL   MCH 28.4 26.0 - 34.0 pg   MCHC 32.1 30.0 -  36.0 g/dL   RDW 16.115.0 09.611.5 - 04.515.5 %   Platelets 336 150 - 400 K/uL  Comprehensive metabolic panel     Status: Abnormal   Collection Time: 12/06/17  6:53 AM  Result Value Ref Range   Sodium 138 135 - 145 mmol/L   Potassium 3.9 3.5 - 5.1 mmol/L   Chloride 108 98 - 111 mmol/L   CO2 24 22 - 32 mmol/L   Glucose, Bld 99 70 - 99 mg/dL   BUN 12 6 - 20 mg/dL   Creatinine, Ser 4.090.93 0.44 - 1.00 mg/dL   Calcium 8.7 (L) 8.9 - 10.3 mg/dL   Total Protein 6.4 (L) 6.5 - 8.1 g/dL   Albumin 3.3 (L) 3.5 - 5.0 g/dL   AST 15 15 - 41 U/L   ALT 22 0 - 44 U/L   Alkaline Phosphatase 106 38 - 126 U/L   Total Bilirubin 0.6 0.3 - 1.2 mg/dL   GFR calc non Af Amer >60 >60 mL/min   GFR calc Af Amer >60 >60 mL/min   Anion gap 6 5 - 15   Meds ordered this encounter  Medications  . oxyCODONE (Oxy IR/ROXICODONE) immediate release tablet 5 mg  . amLODipine (NORVASC) tablet 10 mg  . lisinopril (PRINIVIL,ZESTRIL) 10 MG tablet    Sig: Take 1 tablet (10 mg total) by mouth daily.    Dispense:  30 tablet    Refill:  0    Order Specific Question:   Supervising Provider    Answer:   Reva BoresPRATT, TANYA S [[id number]]     ASSESSMENT AND PLAN: --42 y.o. [id number] s/p NSVD 11/27/17 --Elevated BP, no severe range, headache now resolved --Preeclampsia labs normal --Discussed medication regimen with Dr. Emelda FearFerguson. Lisinopril 10mg  daily added to current Norvasc 10mg  daily --Discharge home in stable condition with Preeclampsia precautions  F[facility]U: [facility] Message sent to clinic  [place] Douglas[place]antha Weinhold,  [place] 12/06/17  10:06 AM

## 2017-12-07 ENCOUNTER — Ambulatory Visit: Payer: 59

## 2017-12-07 VITALS — BP 145/90 | HR 67

## 2017-12-07 DIAGNOSIS — Z0130 Encounter for examination of blood pressure without abnormal findings: Secondary | ICD-10-CM

## 2017-12-07 NOTE — Progress Notes (Signed)
Chart reviewed for nurse visit. Agree with plan of care as stated in RN notes. BP trending towards baseline. Patient to schedule pp visit and follow up PRN  Clayton BiblesSamantha Nelia Rogoff, CNM 12/07/17 9:48 AM

## 2017-12-07 NOTE — Progress Notes (Signed)
Pt is here for BP check. She is 10 days PP. Pt is prescribed lisinopril 10mg  once daily, she states she has not taken the medication yet this morning. Pt denies headaches or vision changes

## 2017-12-14 ENCOUNTER — Telehealth: Payer: Self-pay

## 2017-12-14 MED ORDER — LABETALOL HCL 200 MG PO TABS: 400.0000 mg | ORAL_TABLET | Freq: Two times a day (BID) | ORAL | 0 refills | Status: DC

## 2017-12-14 NOTE — Telephone Encounter (Signed)
Returned call, pt wants to change BP med from lisinopril to labetalol due to breastfeeding. S/w provider and advised pt that rx would be sent and scheduled appt for BP check next week.

## 2017-12-21 ENCOUNTER — Encounter: Payer: Self-pay | Admitting: Obstetrics and Gynecology

## 2017-12-21 ENCOUNTER — Ambulatory Visit (INDEPENDENT_AMBULATORY_CARE_PROVIDER_SITE_OTHER): Payer: 59 | Admitting: Obstetrics and Gynecology

## 2017-12-21 DIAGNOSIS — O10019 Pre-existing essential hypertension complicating pregnancy, unspecified trimester: Secondary | ICD-10-CM

## 2017-12-21 DIAGNOSIS — O24419 Gestational diabetes mellitus in pregnancy, unspecified control: Secondary | ICD-10-CM

## 2017-12-21 DIAGNOSIS — Z3009 Encounter for other general counseling and advice on contraception: Secondary | ICD-10-CM

## 2017-12-21 NOTE — Progress Notes (Signed)
Obstetrics/Postpartum Visit  Appointment Date: 12/21/2017  OBGYN Clinic: [place]  Primary Care Provider: Sigmund Hazel  Chief Complaint:  Chief Complaint  Patient presents with  . Postpartum Care    History of Present Illness: Tracey [name] is a 42 y.o. African-American [id number] (No LMP recorded.), seen for the above chief complaint. Her past medical history is signicficant for cHTN, currently on lisonopril.   She is s/p SVD on 11/27/17 at 36 weeks; she was discharged to home on PPD#2. 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  Complains of mild headache last night. Worried about blood pressure issues because she has not previously been on medication. Otherwise feeling well.   Vaginal bleeding or discharge: No  Breast or formula feeding: bottle Intercourse: No  Contraception: nothing currently PP depression s/s: No  Any bowel or bladder issues: No  Pap smear: no abnormalities (date: 05/2017)  Review of Systems: Positive for n/a.   Her 12 point review of systems is negative or as noted in the History of Present Illness.  Patient Active Problem List   Diagnosis Date Noted  . Indication for care in labor or delivery 11/27/2017  . Anxiety 11/27/2017  . PROM (premature rupture of membranes) 11/27/2017  . GDM (gestational diabetes mellitus) 09/28/2017  . Carpal tunnel syndrome during pregnancy 09/06/2017  . Heartburn during pregnancy, antepartum 09/06/2017  . AMA (advanced maternal age) multigravida 35+ 06/11/2017  . Vitamin D deficiency 06/07/2017  . Supervision of high risk pregnancy, antepartum 06/04/2017  . History of prior pregnancy with IUGR newborn 06/04/2017  . Chronic hypertension during pregnancy, antepartum 02/09/2016  . Anxiety, generalized 02/09/2016   Medications Tracey [name] had no medications administered during this visit. Current Outpatient Medications  Medication Sig Dispense Refill  . lisinopril (PRINIVIL,ZESTRIL)  10 MG tablet Take 1 tablet (10 mg total) by mouth daily. 30 tablet 0  . prenatal vitamin w/FE, FA (PRENATAL 1 + 1) 27-1 MG TABS tablet Take 1 tablet by mouth daily before breakfast. 30 each 11  . pantoprazole (PROTONIX) 40 MG tablet Take 1 tablet (40 mg total) by mouth daily. (Patient not taking: Reported on 12/07/2017) 30 tablet 3  . Probiotic Product (PROBIOTIC DAILY PO) Take 1 tablet by mouth daily.     . Vitamin D, Ergocalciferol, (DRISDOL) 50000 units CAPS capsule Take 1 capsule (50,000 Units total) by mouth every 7 (seven) days. (Patient not taking: Reported on 12/21/2017) 30 capsule 2   No current facility-administered medications for this visit.     Allergies Patient has no known allergies.  Physical Exam:  BP (!) 148/92   Pulse 72   Wt 189 lb (85.7 kg)   BMI 29.60 kg/m  Body mass index is 29.6 kg/m. General appearance: Well nourished, well developed female in no acute distress.  Cardiovascular: regular rate and rhythm Respiratory:  Clear to auscultation bilateral. Normal respiratory effort Abdomen: positive bowel sounds and no masses, hernias; diffusely non tender to palpation, non distended Breasts: not examined. Neuro/Psych:  Normal mood and affect.  Skin:  Warm and dry.   PP Depression Screening:   Edinburgh Postnatal Depression Scale - 12/21/17 0959      Edinburgh Postnatal Depression Scale:  In the Past 7 Days   I have been able to laugh and see the funny side of things.  0    I have looked forward with enjoyment to things.  0    I have blamed myself unnecessarily when things went wrong.  1  I have been anxious or worried for no good reason.  1    I have felt scared or panicky for no good reason.  1    Things have been getting on top of me.  1    I have been so unhappy that I have had difficulty sleeping.  0    I have felt sad or miserable.  0    I have been so unhappy that I have been crying.  0    The thought of harming myself has occurred to me.  0     Edinburgh Postnatal Depression Scale Total  4        Assessment: Patient is a 42 y.o. [id number] who is 3.5 weeks post partum from a SVD. She is doing well, BP slightly elevated, on lisinopril and worried that she continues to need it. Provided reassurance regarding anti-hypertensive.   Plan:  1. Postpartum care and examination  2. Benign essential HTN, chronic, antepartum, unspecified trimester BP mildly elevated today Continue lisinopril  3. Preterm delivery  4. Encounter for counseling regarding contraception Reviewed all options for contraception, risks/benefits, patient would like Mirena IUD, will return for insertion as she is < 4 weeks from delivery  5. Gestational diabetes mellitus (GDM), antepartum, gestational diabetes method of control unspecified 2 hr GTT ordered   RTC for IUD insertion, 2 hr GTT   K. Therese Sarah, M.D. [facility]

## 2017-12-21 NOTE — Progress Notes (Signed)
error 

## 2018-01-09 ENCOUNTER — Ambulatory Visit: Payer: 59 | Admitting: Obstetrics & Gynecology

## 2018-01-09 ENCOUNTER — Other Ambulatory Visit: Payer: 59

## 2018-01-10 ENCOUNTER — Other Ambulatory Visit: Payer: 59

## 2018-01-11 ENCOUNTER — Encounter: Payer: Self-pay | Admitting: Obstetrics & Gynecology

## 2018-01-11 LAB — GLUCOSE TOLERANCE, 2 HOURS
Glucose, 2 hour: 65 mg/dL (ref 65–139)
Glucose, GTT - Fasting: 85 mg/dL (ref 65–99)

## 2018-01-16 ENCOUNTER — Other Ambulatory Visit: Payer: Self-pay

## 2018-01-16 DIAGNOSIS — O10019 Pre-existing essential hypertension complicating pregnancy, unspecified trimester: Secondary | ICD-10-CM

## 2018-01-16 MED ORDER — LISINOPRIL 10 MG PO TABS: 10.0000 mg | ORAL_TABLET | Freq: Every day | ORAL | 0 refills | Status: DC

## 2018-01-21 ENCOUNTER — Encounter: Payer: Self-pay | Admitting: Obstetrics

## 2018-01-21 ENCOUNTER — Ambulatory Visit (INDEPENDENT_AMBULATORY_CARE_PROVIDER_SITE_OTHER): Payer: 59 | Admitting: Obstetrics

## 2018-01-21 VITALS — BP 157/91 | HR 64 | Resp 16 | Ht 67.0 in | Wt 191.0 lb

## 2018-01-21 DIAGNOSIS — Z01818 Encounter for other preprocedural examination: Secondary | ICD-10-CM

## 2018-01-21 DIAGNOSIS — Z3202 Encounter for pregnancy test, result negative: Secondary | ICD-10-CM | POA: Diagnosis not present

## 2018-01-21 LAB — POCT URINE PREGNANCY: PREG TEST UR: NEGATIVE

## 2018-01-21 NOTE — Progress Notes (Signed)
Patient was here for Mirena insertion - had sexual intercourse on Saturday, 01/19/18. Patient was advised that her procedure could not be done today - she must not have had sexual intercourse 10 days prior to having procedure. Patient stated she understood and would rescheduled appt.

## 2018-01-22 ENCOUNTER — Encounter: Payer: Self-pay | Admitting: Obstetrics

## 2018-01-22 NOTE — Progress Notes (Signed)
Appointment rescheduled.

## 2018-01-22 NOTE — Addendum Note (Signed)
Addended by: Coral Ceo A on: 01/22/2018 08:51 AM   Modules accepted: Level of Service

## 2018-02-05 ENCOUNTER — Encounter: Payer: Self-pay | Admitting: Obstetrics

## 2018-02-05 ENCOUNTER — Other Ambulatory Visit: Payer: Self-pay

## 2018-02-05 ENCOUNTER — Ambulatory Visit (INDEPENDENT_AMBULATORY_CARE_PROVIDER_SITE_OTHER): Payer: 59 | Admitting: Obstetrics

## 2018-02-05 VITALS — BP 144/87 | HR 82 | Ht 66.0 in | Wt 187.3 lb

## 2018-02-05 DIAGNOSIS — Z30430 Encounter for insertion of intrauterine contraceptive device: Secondary | ICD-10-CM

## 2018-02-05 DIAGNOSIS — Z3202 Encounter for pregnancy test, result negative: Secondary | ICD-10-CM

## 2018-02-05 LAB — POCT URINE PREGNANCY: Preg Test, Ur: NEGATIVE

## 2018-02-05 MED ORDER — LEVONORGESTREL 20 MCG/24HR IU IUD
INTRAUTERINE_SYSTEM | Freq: Once | INTRAUTERINE | Status: AC
  Administered 2018-02-05: 1 via INTRAUTERINE

## 2018-02-05 NOTE — Progress Notes (Signed)
IUD Procedure Note   DIAGNOSIS: Desires long-term, reversible contraception   PROCEDURE: IUD placement Performing Provider: Brock BadHARLES A. Clariece Roesler MD Patient counseled prior to procedure. I explained risks and benefits of MIRENA IUD, reviewed alternative forms of contraception. Patient stated understanding and consented to continue with procedure.   LMP: 01-29-2018 Pregnancy Test: Negative Lot #: [id number] Expiration Date: JAN  2022   IUD type: [X]  Mirena   []  Paragard  []  Lyletta   []   Kyleena  PROCEDURE:  Timeout procedure was performed to ensure right patient and right site.  A bimanual exam was performed to determine the position of the uterus, MIDPOSITION. The speculum was placed. The vagina and cervix was sterilized in the usual manner and sterile technique was maintained throughout the course of the procedure. A single toothed tenaculum was applied to the posterior lip of the cervix and gentle traction applied. The depth of the uterus was sounded to 8cm. With gentle traction on the tenaculum, the IUD was inserted to the appropriate depth and inserted without difficulty.  The string was cut to an estimated 4 cm length. Bleeding was minimal. The patient tolerated the procedure well.   Follow up: The patient tolerated the procedure well without complications.  Standard post-procedure care is explained and return precautions are given.  Brock BadHARLES A. Alvin Rubano MD 02-05-2018

## 2018-02-05 NOTE — Progress Notes (Signed)
Presents for [employer] Corporation[employer]rena Insert. UPT today is Negative.

## 2018-03-05 ENCOUNTER — Ambulatory Visit: Payer: 59 | Admitting: Obstetrics

## 2018-03-07 ENCOUNTER — Ambulatory Visit: Payer: 59 | Admitting: Obstetrics

## 2018-03-18 ENCOUNTER — Encounter: Payer: Self-pay | Admitting: Obstetrics & Gynecology

## 2018-03-18 ENCOUNTER — Ambulatory Visit (INDEPENDENT_AMBULATORY_CARE_PROVIDER_SITE_OTHER): Payer: 59 | Admitting: Obstetrics & Gynecology

## 2018-03-18 VITALS — BP 124/80 | HR 90 | Wt 192.0 lb

## 2018-03-18 DIAGNOSIS — Z30431 Encounter for routine checking of intrauterine contraceptive device: Secondary | ICD-10-CM | POA: Diagnosis not present

## 2018-03-18 NOTE — Patient Instructions (Signed)

## 2018-03-18 NOTE — Progress Notes (Signed)
CC: None RGYN presents for IUD Check.   No pain or abnormal discharge. Slight vaginal spotting  Pelvic exam: normal external genitalia, vulva, vagina, cervix, uterus and adnexa. String 2 cm at os and device not palpable.  Mirena  Inserted: 02/05/18   Imp: normal Mirena placement  Plan Routine Gyn f/u care, pap was nl 05/2017  Adam PhenixArnold, Vanden Fawaz G, MD 03/18/2018

## 2018-06-20 ENCOUNTER — Other Ambulatory Visit: Payer: Self-pay | Admitting: Obstetrics

## 2018-11-20 ENCOUNTER — Other Ambulatory Visit: Payer: Self-pay | Admitting: Obstetrics

## 2018-11-20 DIAGNOSIS — Z0000 Encounter for general adult medical examination without abnormal findings: Secondary | ICD-10-CM

## 2018-12-09 ENCOUNTER — Ambulatory Visit: Payer: 59 | Admitting: Obstetrics & Gynecology

## 2018-12-09 DIAGNOSIS — Z01419 Encounter for gynecological examination (general) (routine) without abnormal findings: Secondary | ICD-10-CM

## 2020-04-01 ENCOUNTER — Ambulatory Visit (HOSPITAL_COMMUNITY): Payer: Self-pay

## 2020-04-02 ENCOUNTER — Observation Stay (HOSPITAL_COMMUNITY)
Admission: EM | Admit: 2020-04-02 | Discharge: 2020-04-04 | Disposition: A | Discharge status: Home / Self Care | Payer: 59 | Attending: Internal Medicine | Admitting: Internal Medicine

## 2020-04-02 ENCOUNTER — Emergency Department (HOSPITAL_COMMUNITY): Payer: 59

## 2020-04-02 ENCOUNTER — Other Ambulatory Visit: Payer: Self-pay

## 2020-04-02 ENCOUNTER — Encounter (HOSPITAL_COMMUNITY): Payer: Self-pay | Admitting: Emergency Medicine

## 2020-04-02 DIAGNOSIS — F411 Generalized anxiety disorder: Secondary | ICD-10-CM | POA: Diagnosis present

## 2020-04-02 DIAGNOSIS — R200 Anesthesia of skin: Secondary | ICD-10-CM | POA: Insufficient documentation

## 2020-04-02 DIAGNOSIS — I6330 Cerebral infarction due to thrombosis of unspecified cerebral artery: Secondary | ICD-10-CM | POA: Diagnosis not present

## 2020-04-02 DIAGNOSIS — R29898 Other symptoms and signs involving the musculoskeletal system: Secondary | ICD-10-CM | POA: Diagnosis not present

## 2020-04-02 DIAGNOSIS — M6281 Muscle weakness (generalized): Secondary | ICD-10-CM | POA: Diagnosis present

## 2020-04-02 DIAGNOSIS — I10 Essential (primary) hypertension: Secondary | ICD-10-CM | POA: Diagnosis present

## 2020-04-02 DIAGNOSIS — F17210 Nicotine dependence, cigarettes, uncomplicated: Secondary | ICD-10-CM | POA: Insufficient documentation

## 2020-04-02 DIAGNOSIS — Z20822 Contact with and (suspected) exposure to covid-19: Secondary | ICD-10-CM | POA: Diagnosis not present

## 2020-04-02 DIAGNOSIS — M542 Cervicalgia: Secondary | ICD-10-CM | POA: Insufficient documentation

## 2020-04-02 DIAGNOSIS — Z79899 Other long term (current) drug therapy: Secondary | ICD-10-CM | POA: Diagnosis not present

## 2020-04-02 DIAGNOSIS — E876 Hypokalemia: Secondary | ICD-10-CM | POA: Diagnosis not present

## 2020-04-02 DIAGNOSIS — F32A Depression, unspecified: Secondary | ICD-10-CM | POA: Diagnosis present

## 2020-04-02 DIAGNOSIS — I639 Cerebral infarction, unspecified: Secondary | ICD-10-CM

## 2020-04-02 LAB — I-STAT CHEM 8, ED
BUN: 10 mg/dL (ref 6–20)
Calcium, Ion: 1.16 mmol/L (ref 1.15–1.40)
Chloride: 105 mmol/L (ref 98–111)
Creatinine, Ser: 1.1 mg/dL — ABNORMAL HIGH (ref 0.44–1.00)
Glucose, Bld: 94 mg/dL (ref 70–99)
HCT: 42 % (ref 36.0–46.0)
Hemoglobin: 14.3 g/dL (ref 12.0–15.0)
Potassium: 3.7 mmol/L (ref 3.5–5.1)
Sodium: 139 mmol/L (ref 135–145)
TCO2: 26 mmol/L (ref 22–32)

## 2020-04-02 LAB — PROTIME-INR
INR: 0.9 (ref 0.8–1.2)
Prothrombin Time: 12.2 seconds (ref 11.4–15.2)

## 2020-04-02 LAB — DIFFERENTIAL
Abs Immature Granulocytes: 0.03 10*3/uL (ref 0.00–0.07)
Basophils Absolute: 0.1 10*3/uL (ref 0.0–0.1)
Basophils Relative: 1 %
Eosinophils Absolute: 0.2 10*3/uL (ref 0.0–0.5)
Eosinophils Relative: 2 %
Immature Granulocytes: 0 %
Lymphocytes Relative: 40 %
Lymphs Abs: 3.9 10*3/uL (ref 0.7–4.0)
Monocytes Absolute: 0.9 10*3/uL (ref 0.1–1.0)
Monocytes Relative: 9 %
Neutro Abs: 4.7 10*3/uL (ref 1.7–7.7)
Neutrophils Relative %: 48 %

## 2020-04-02 LAB — COMPREHENSIVE METABOLIC PANEL
ALT: 22 U/L (ref 0–44)
AST: 17 U/L (ref 15–41)
Albumin: 4.3 g/dL (ref 3.5–5.0)
Alkaline Phosphatase: 47 U/L (ref 38–126)
Anion gap: 11 (ref 5–15)
BUN: 10 mg/dL (ref 6–20)
CO2: 23 mmol/L (ref 22–32)
Calcium: 9.4 mg/dL (ref 8.9–10.3)
Chloride: 105 mmol/L (ref 98–111)
Creatinine, Ser: 1.05 mg/dL — ABNORMAL HIGH (ref 0.44–1.00)
GFR, Estimated: >60 mL/min (ref >60)
Glucose, Bld: 93 mg/dL (ref 70–99)
Potassium: 3.3 mmol/L — ABNORMAL LOW (ref 3.5–5.1)
Sodium: 139 mmol/L (ref 135–145)
Total Bilirubin: 0.3 mg/dL (ref 0.3–1.2)
Total Protein: 7.6 g/dL (ref 6.5–8.1)

## 2020-04-02 LAB — URINALYSIS, ROUTINE W REFLEX MICROSCOPIC
Bilirubin Urine: NEGATIVE
Glucose, UA: NEGATIVE mg/dL
Hgb urine dipstick: NEGATIVE
Ketones, ur: NEGATIVE mg/dL
Nitrite: NEGATIVE
Protein, ur: NEGATIVE mg/dL
Specific Gravity, Urine: 1.004 — ABNORMAL LOW (ref 1.005–1.030)
pH: 7 (ref 5.0–8.0)

## 2020-04-02 LAB — CBC
HCT: 41.5 % (ref 36.0–46.0)
Hemoglobin: 13.6 g/dL (ref 12.0–15.0)
MCH: 29.3 pg (ref 26.0–34.0)
MCHC: 32.8 g/dL (ref 30.0–36.0)
MCV: 89.4 fL (ref 80.0–100.0)
Platelets: 267 10*3/uL (ref 150–400)
RBC: 4.64 MIL/uL (ref 3.87–5.11)
RDW: 13.7 % (ref 11.5–15.5)
WBC: 9.6 10*3/uL (ref 4.0–10.5)
nRBC: 0 % (ref 0.0–0.2)

## 2020-04-02 LAB — APTT: aPTT: 36 seconds (ref 24–36)

## 2020-04-02 LAB — ETHANOL: Alcohol, Ethyl (B): <10 mg/dL (ref <10)

## 2020-04-02 LAB — I-STAT BETA HCG BLOOD, ED (MC, WL, AP ONLY): I-stat hCG, quantitative: <5 m[IU]/mL (ref <5)

## 2020-04-02 LAB — CBG MONITORING, ED: Glucose-Capillary: 97 mg/dL (ref 70–99)

## 2020-04-02 LAB — RAPID URINE DRUG SCREEN, HOSP PERFORMED
Amphetamines: NOT DETECTED
Barbiturates: NOT DETECTED
Benzodiazepines: NOT DETECTED
Cocaine: NOT DETECTED
Opiates: NOT DETECTED
Tetrahydrocannabinol: NOT DETECTED

## 2020-04-02 IMAGING — CT CT HEAD CODE STROKE
3 series · 15 of 47 positions shown, 18 images · non-contrast
Comparison: None.

CLINICAL DATA: Code stroke. Sudden onset left lower extremity
weakness

EXAM:
CT HEAD WITHOUT CONTRAST
TECHNIQUE: Contiguous axial images were obtained from the base of the skull
through the vertex without intravenous contrast.

[Series 4: head wo · axial · 0.47mm/px · z∈[-141,-16]mm · 9 of 31 slices shown, 12 images]
[im 3/31  brain]
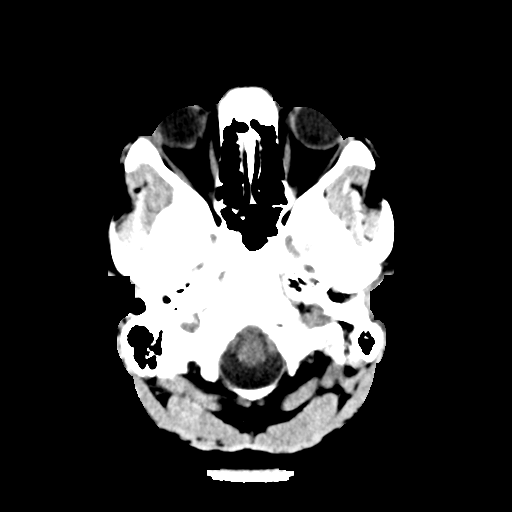
[im 3/31  bone]
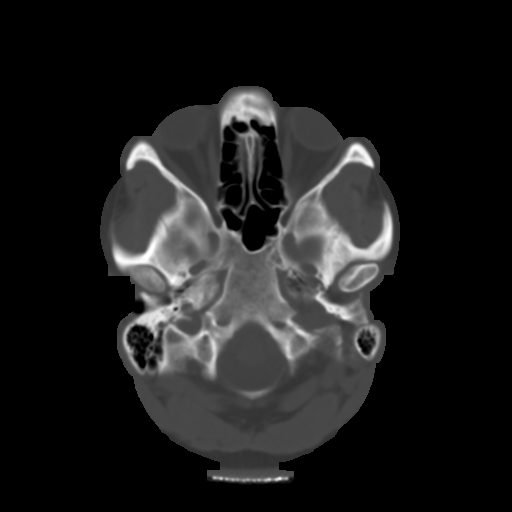
[im 6/31  brain]
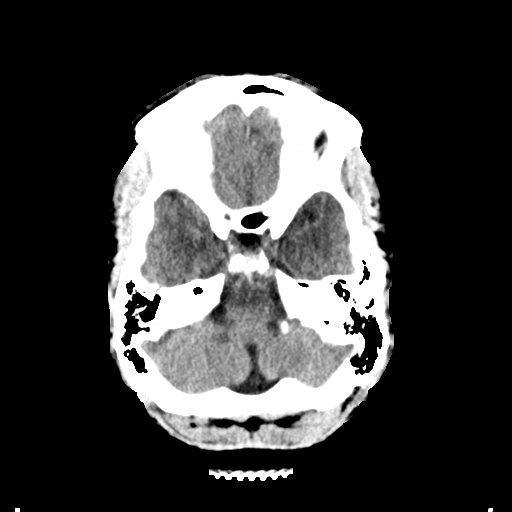
[im 9/31  brain]
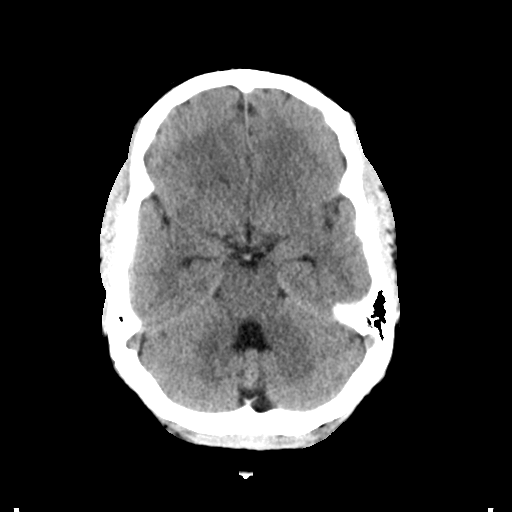
[im 12/31  brain]
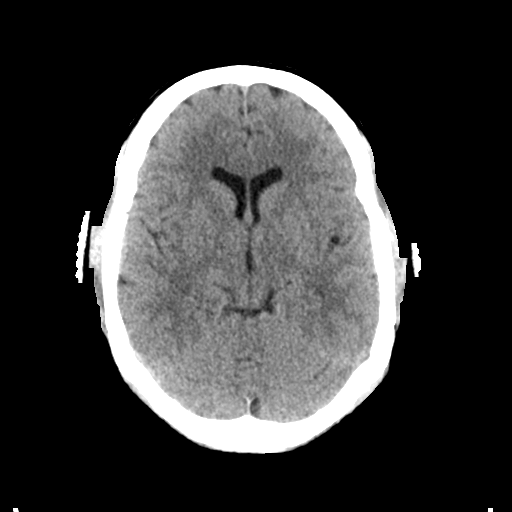
[im 16/31  brain]
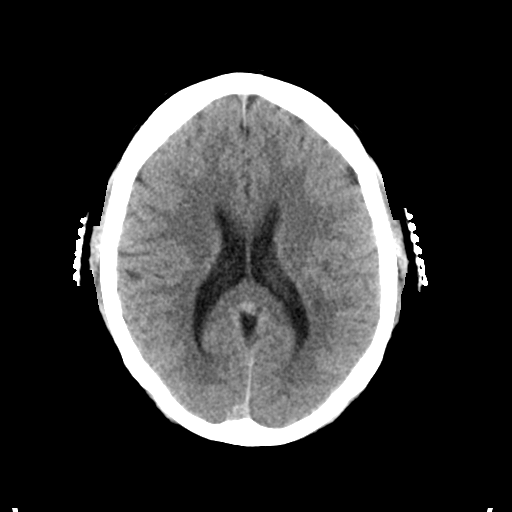
[im 16/31  bone]
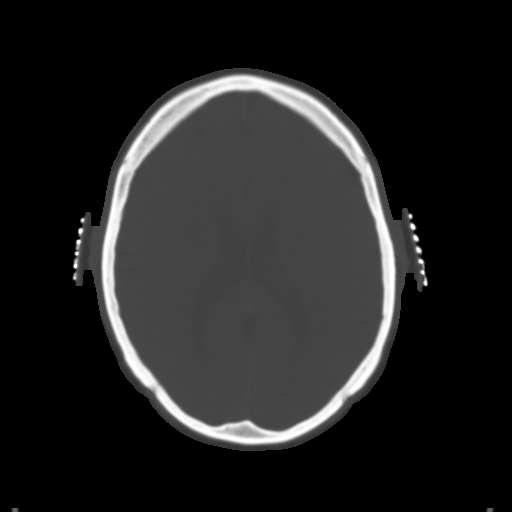
[im 19/31  brain]
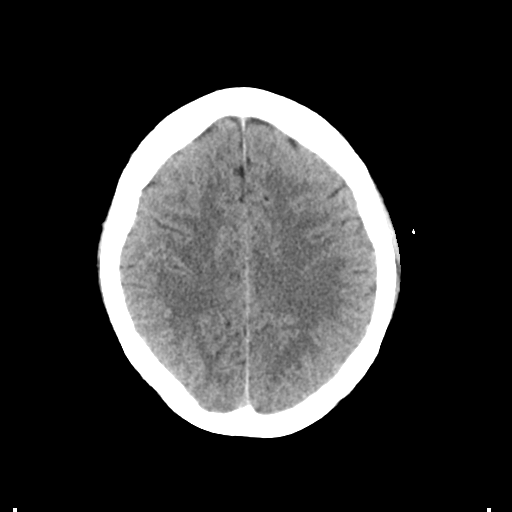
[im 22/31  brain]
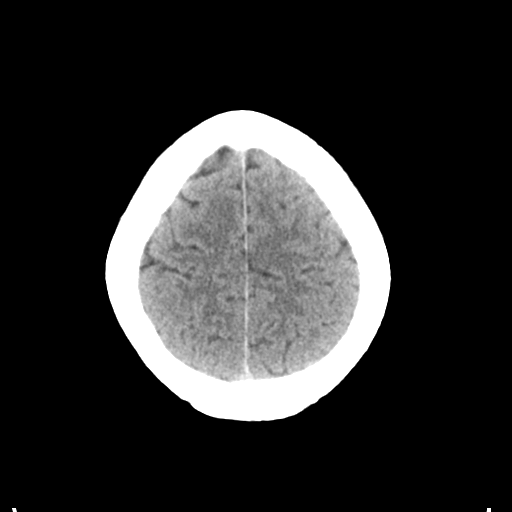
[im 25/31  brain]
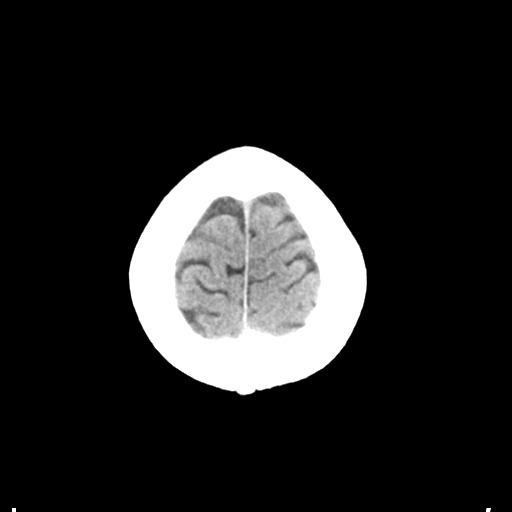
[im 28/31  brain]
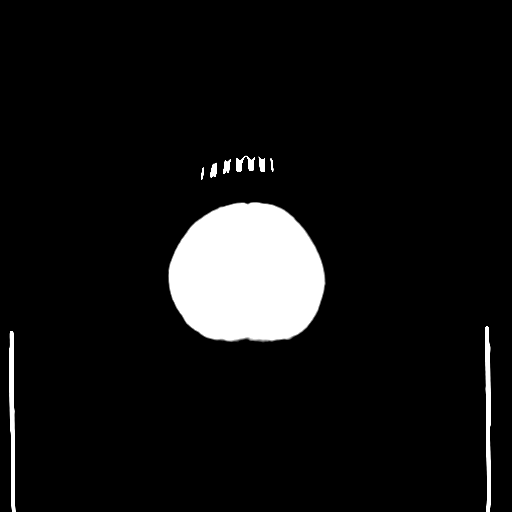
[im 28/31  bone]
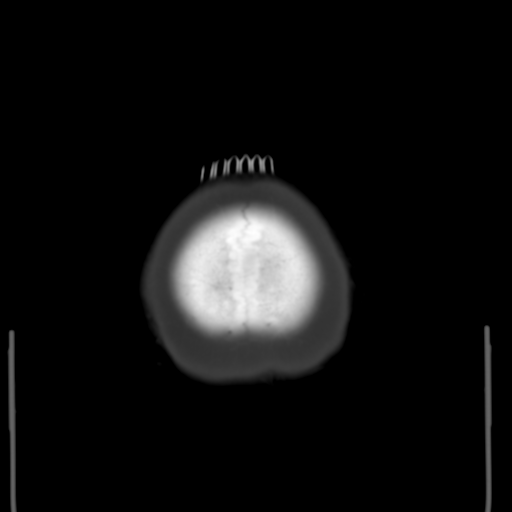

[Series 7: coronal soft tissue · coronal · 0.31mm/px · 3 of 67 slices shown]
[im 23/67  brain]
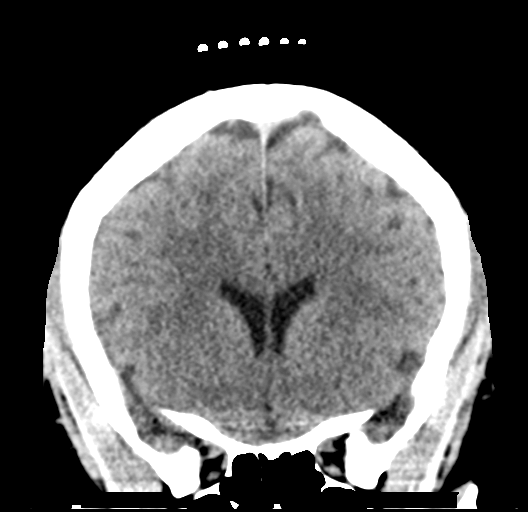
[im 30/67  brain]
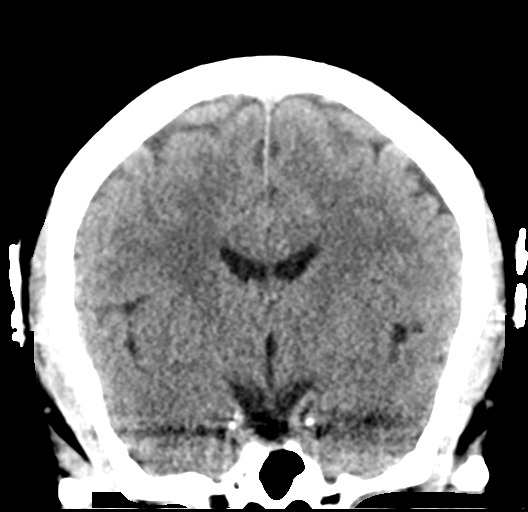
[im 37/67  brain]
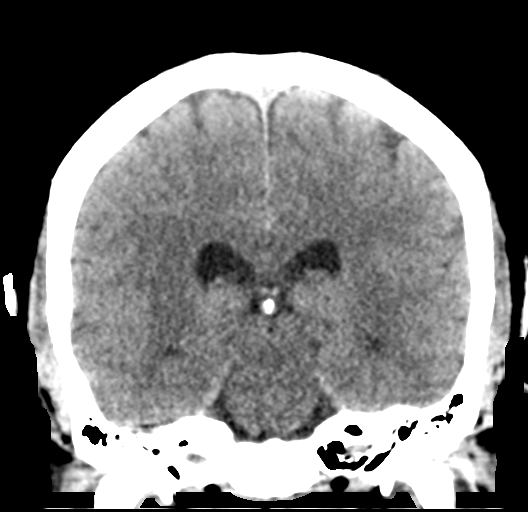

[Series 8: sagittal soft tissue · sagittal · 0.31mm/px · 3 of 55 slices shown]
[im 19/55  brain]
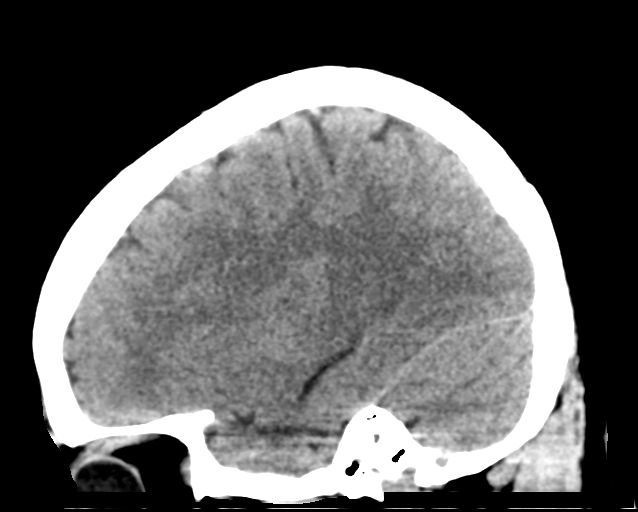
[im 28/55  brain]
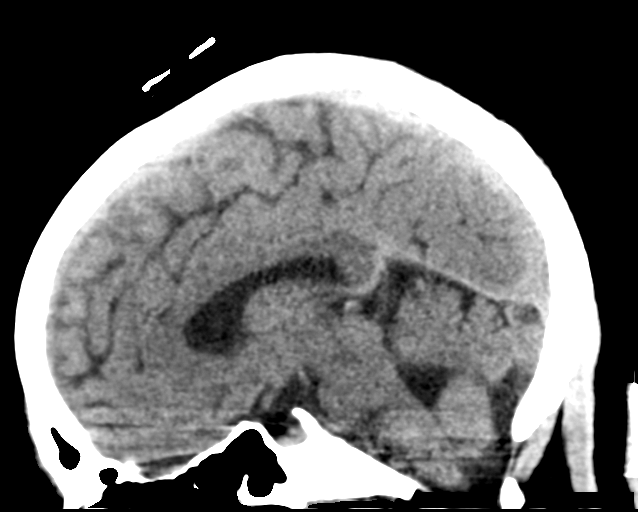
[im 37/55  brain]
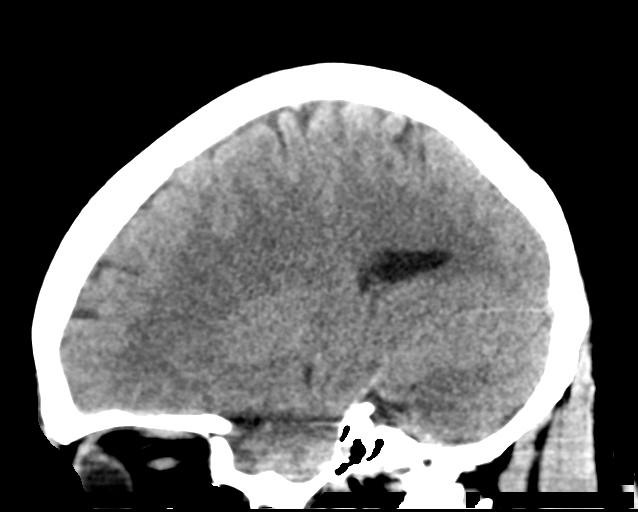

[15 of 47 positions shown; findings below may reference images not displayed]

FINDINGS: Brain: There is no mass, hemorrhage or extra-axial collection. The
size and configuration of the ventricles and extra-axial CSF spaces
are normal. The brain parenchyma is normal, without evidence of
acute or chronic infarction.

Vascular: No abnormal hyperdensity of the major intracranial
arteries or dural venous sinuses. No intracranial atherosclerosis.

Skull: The visualized skull base, calvarium and extracranial soft
tissues are normal.

Sinuses/Orbits: No fluid levels or advanced mucosal thickening of
the visualized paranasal sinuses. No mastoid or middle ear effusion.
The orbits are normal.

ASPECTS (Alberta Stroke Program Early CT Score)

- Ganglionic level infarction (caudate, lentiform nuclei, internal
capsule, insula, M1-M3 cortex): 7

- Supraganglionic infarction (M4-M6 cortex): 3

Total score (0-10 with 10 being normal): 10
IMPRESSION: 1. Normal head CT.
2. ASPECTS is 10.

These results were called by telephone at the time of interpretation
on 04/02/2020 at [DATE] to provider MORIAKI SMOOT , who verbally
acknowledged these results.

## 2020-04-02 IMAGING — CT CT ANGIO NECK
2 of 7 series · 8 of 33 positions shown · IV contrast (omnipaque)
Comparison: None.

CLINICAL DATA: Left lower extremity weakness

EXAM:
CT ANGIOGRAPHY HEAD AND NECK
TECHNIQUE: Multidetector CT imaging of the head and neck was performed using
the standard protocol during bolus administration of intravenous
contrast. Multiplanar CT image reconstructions and MIPs were
obtained to evaluate the vascular anatomy. Carotid stenosis
measurements (when applicable) are obtained utilizing NASCET
criteria, using the distal internal carotid diameter as the
denominator.
CONTRAST:  60mL OMNIPAQUE IOHEXOL 350 MG/ML SOLN

[Series 5: cta head neck · axial · 0.45mm/px · z∈[-228,-118]mm · 2 of 166 slices shown]
[im 56/166  soft-tissue]
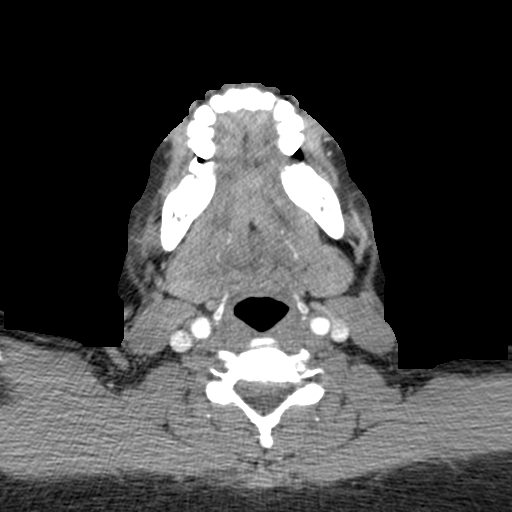
[im 111/166  soft-tissue]
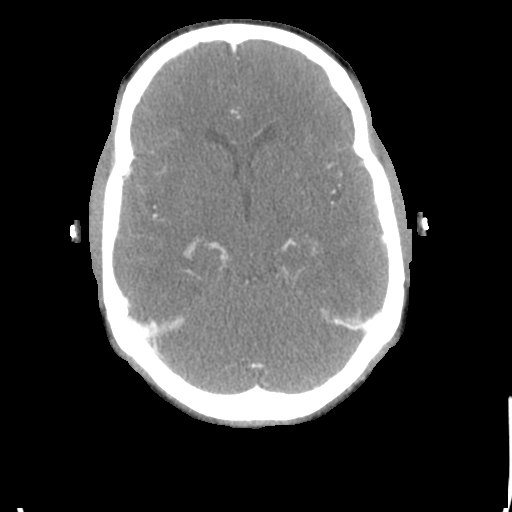

[Series 7: ax thin · axial · 0.39mm/px · z∈[-291,-59]mm · 6 of 326 slices shown]
[im 47/326  soft-tissue]
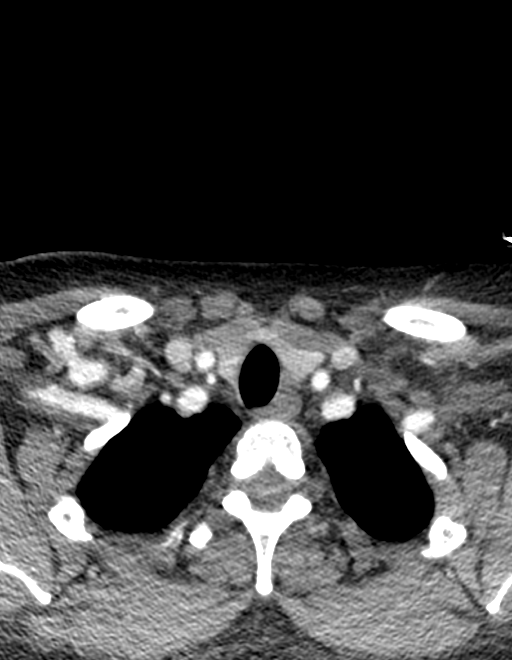
[im 93/326  bone]
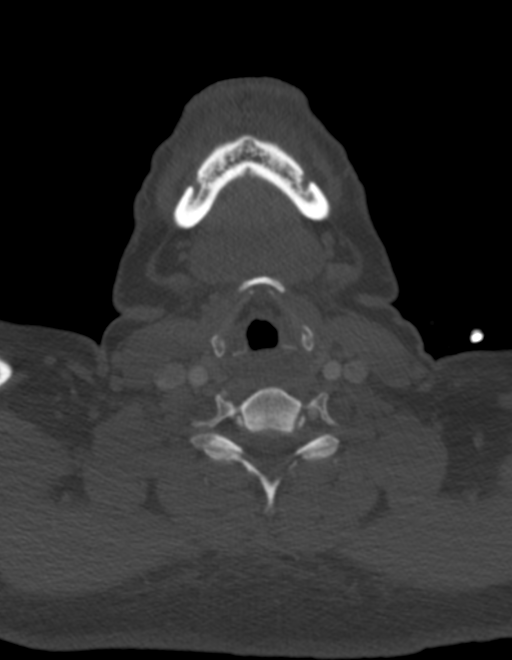
[im 140/326  soft-tissue]
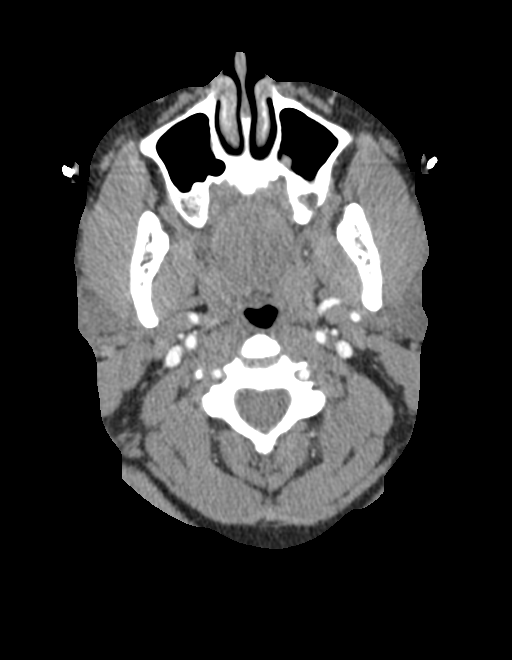
[im 186/326  bone]
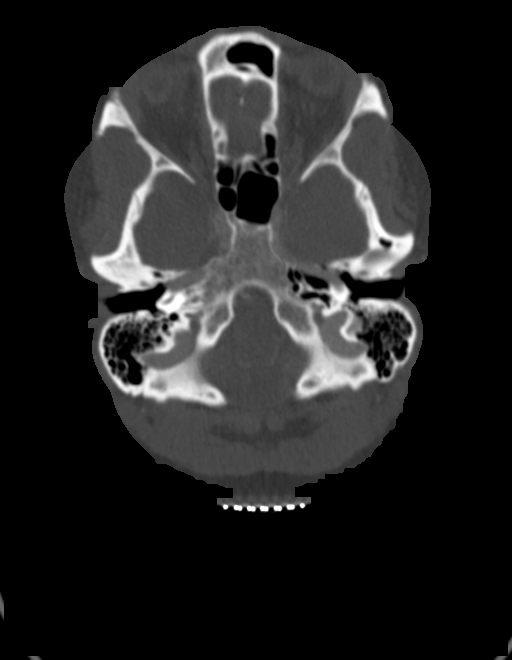
[im 233/326  soft-tissue]
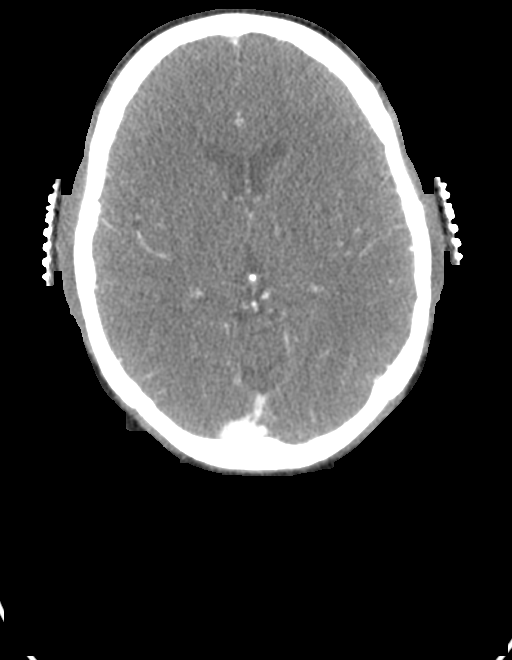
[im 279/326  bone]
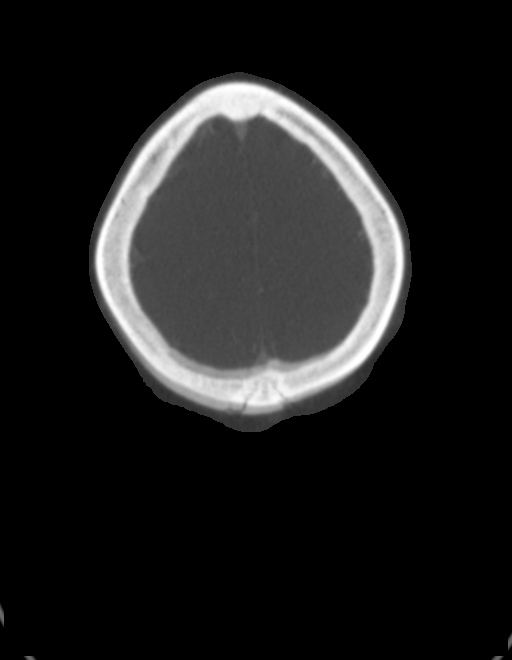

[8 of 33 positions shown; findings below may reference images not displayed]

FINDINGS: CTA NECK FINDINGS

SKELETON: There is no bony spinal canal stenosis. No lytic or
blastic lesion.

OTHER NECK: Normal pharynx, larynx and major salivary glands. No
cervical lymphadenopathy. Unremarkable thyroid gland.

UPPER CHEST: No pneumothorax or pleural effusion. No nodules or
masses.

AORTIC ARCH:

There is no calcific atherosclerosis of the aortic arch. There is no
aneurysm, dissection or hemodynamically significant stenosis of the
visualized portion of the aorta. Conventional 3 vessel aortic
branching pattern. The visualized proximal subclavian arteries are
widely patent.

RIGHT CAROTID SYSTEM: Normal without aneurysm, dissection or
stenosis.

LEFT CAROTID SYSTEM: Normal without aneurysm, dissection or
stenosis.

VERTEBRAL ARTERIES: Left dominant configuration. Both origins are
clearly patent. There is no dissection, occlusion or flow-limiting
stenosis to the skull base (V1-V3 segments).

CTA HEAD FINDINGS

POSTERIOR CIRCULATION:

--Vertebral arteries: Normal V4 segments.

--Inferior cerebellar arteries: Normal.

--Basilar artery: Normal.

--Superior cerebellar arteries: Normal.

--Posterior cerebral arteries (PCA): Normal.

ANTERIOR CIRCULATION:

--Intracranial internal carotid arteries: Normal.

--Anterior cerebral arteries (ACA): Normal. Both A1 segments are
present. Patent anterior communicating artery (a-comm).

--Middle cerebral arteries (MCA): Normal.

VENOUS SINUSES: As permitted by contrast timing, patent.

ANATOMIC VARIANTS: None

Review of the MIP images confirms the above findings.
IMPRESSION: Normal CTA of the head and neck.

## 2020-04-02 MED ORDER — LABETALOL HCL 5 MG/ML IV SOLN: 10.0000 mg | INTRAVENOUS | Status: DC | PRN

## 2020-04-02 MED ORDER — ACETAMINOPHEN 650 MG RE SUPP: 650.0000 mg | RECTAL | Status: DC | PRN

## 2020-04-02 MED ORDER — SENNOSIDES-DOCUSATE SODIUM 8.6-50 MG PO TABS: 1.0000 | ORAL_TABLET | Freq: Every evening | ORAL | Status: DC | PRN

## 2020-04-02 MED ORDER — SODIUM CHLORIDE 0.9 % IV BOLUS
500.0000 mL | Freq: Once | INTRAVENOUS | Status: AC
  Administered 2020-04-02: 500 mL via INTRAVENOUS

## 2020-04-02 MED ORDER — SODIUM CHLORIDE 0.9 % IV SOLN
100.0000 mL/h | INTRAVENOUS | Status: DC
  Administered 2020-04-02: 100 mL/h via INTRAVENOUS

## 2020-04-02 MED ORDER — IOHEXOL 350 MG/ML SOLN
60.0000 mL | Freq: Once | INTRAVENOUS | Status: AC | PRN
  Administered 2020-04-02: 60 mL via INTRAVENOUS

## 2020-04-02 MED ORDER — ENOXAPARIN SODIUM 40 MG/0.4ML ~~LOC~~ SOLN
40.0000 mg | SUBCUTANEOUS | Status: DC
  Administered 2020-04-03 (×2): 40 mg via SUBCUTANEOUS
  Filled 2020-04-02 (×2): qty 0.4

## 2020-04-02 MED ORDER — ACETAMINOPHEN 325 MG PO TABS
650.0000 mg | ORAL_TABLET | ORAL | Status: DC | PRN
  Administered 2020-04-04: 650 mg via ORAL
  Filled 2020-04-02: qty 2

## 2020-04-02 MED ORDER — ACETAMINOPHEN 160 MG/5ML PO SOLN: 650.0000 mg | ORAL | Status: DC | PRN

## 2020-04-02 MED ORDER — SODIUM CHLORIDE 0.9 % IV SOLN: INTRAVENOUS | Status: AC

## 2020-04-02 MED ORDER — ASPIRIN EC 81 MG PO TBEC
81.0000 mg | DELAYED_RELEASE_TABLET | Freq: Every day | ORAL | Status: DC
  Administered 2020-04-03 – 2020-04-04 (×3): 81 mg via ORAL
  Filled 2020-04-02 (×3): qty 1

## 2020-04-02 MED ORDER — STROKE: EARLY STAGES OF RECOVERY BOOK
Freq: Once | Status: DC
  Filled 2020-04-02 (×2): qty 1

## 2020-04-02 NOTE — Consult Note (Signed)
[facility] Consult Services   Date of Service:   04/02/2020 22:20:58  Diagnosis:     .  R53.1 - Weakness  Impression: The patient is a 45 year old woman with a history of hypertension, migraine, who presents with left leg weakness. Differential includes CVA vs MS vs complex migraine. alteplase risk/benefit discussed extensively with patient and husband who defer. Recommend evaluation of vascular risk factors with MRI brain with and without contrast, TTE, lipid panel, HgbA1C. Tolerate permissive hypertension with BP goal < 185/110. If workup unrevealing, would also consider MRI C and T spine with and without contrast. ASA 81 mg.  Metrics: Last Known Well: 04/02/2020 20:30:00 [facility] Notification Time: 04/02/2020 22:20:58 Arrival Time: 04/02/2020 20:58:00 Stamp Time: 04/02/2020 22:20:58 Initial Response Time: 04/02/2020 22:22:48 Time First Login Attempt: 04/02/2020 22:22:48 Symptoms: L sided heaviness . NIHSS Start Assessment Time: 04/02/2020 22:24:55 Patient is not a candidate for Thrombolytic. Thrombolytic Medical Decision: 04/02/2020 22:41:53 Patient was not deemed candidate for Thrombolytic because of following reasons: patient defer.  CT head showed no acute hemorrhage or acute core infarct. CT head was reviewed.  ED Physician notified of diagnostic impression and management plan on 04/02/2020 22:49:35  Advanced Imaging: CTA Head and Neck Completed.  LVO:No   Our recommendations are outlined below.  Recommendations:     .  Activate Stroke Protocol Admission/Order Set     .  Stroke/Telemetry Floor     .  Neuro Checks     .  Bedside Swallow Eval     .  DVT Prophylaxis     .  IV Fluids, Normal Saline     .  Head of Bed 30 Degrees     .  Euglycemia and Avoid Hyperthermia (PRN Acetaminophen)     .  Initiate Aspirin 81 MG Daily   Sign Out:     .  Discussed with Emergency Department  Provider    ------------------------------------------------------------------------------  History of Present Illness: Patient is a 45 year old Female.  Patient was brought by private transportation with symptoms of L sided heaviness .  Patient was in normal state of health taking shower at 8:30 PM when she developed heaviness over L leg. Also reports pressure in center of neck but no headache. In ER, mild drift in left leg noted, unsteady gait but able to ambulate independently. Denies LBP or leg pain. Has never had prior similar symptoms.  Last seen normal was within 4.5 hours. There is no history of hemorrhagic complications or intracranial hemorrhage. There is no history of Recent Anticoagulants. There is no history of recent major surgery. There is no history of recent stroke.  Past Medical History:     . Hypertension     . migraine  Anticoagulant use:  No  Antiplatelet use: No  Allergies:  NKDA    Examination: BP(150/95), Pulse(63), Blood Glucose(100) 1A: Level of Consciousness - Alert; keenly responsive + 0 1B: Ask Month and Age - Both Questions Right + 0 1C: Blink Eyes & Squeeze Hands - Performs Both Tasks + 0 2: Test Horizontal Extraocular Movements - Normal + 0 3: Test Visual Fields - No Visual Loss + 0 4: Test Facial Palsy (Use Grimace if Obtunded) - Normal symmetry + 0 5A: Test Left Arm Motor Drift - No Drift for 10 Seconds + 0 5B: Test Right Arm Motor Drift - No Drift for 10 Seconds + 0 6A: Test Left Leg Motor Drift - Drift, but doesn't hit bed + 1 6B: Test Right Leg  Motor Drift - No Drift for 5 Seconds + 0 7: Test Limb Ataxia (FNF/Heel-Shin) - No Ataxia + 0 8: Test Sensation - Mild-Moderate Loss: Less Sharp/More Dull + 1 9: Test Language/Aphasia - Normal; No aphasia + 0 10: Test Dysarthria - Normal + 0 11: Test Extinction/Inattention - No abnormality + 0  NIHSS Score: 2  Pre-Morbid Modified Rankin Scale: 0 Points = No symptoms at  all   Patient/Family was informed the Neurology Consult would occur via TeleHealth consult by way of interactive audio and video telecommunications and consented to receiving care in this manner.   Patient is being evaluated for possible acute neurologic impairment and high probability of imminent or life-threatening deterioration. I spent total of 34 minutes providing care to this patient, including time for face to face visit via telemedicine, review of medical records, imaging studies and discussion of findings with providers, the patient and/or family.   Dr Rosanne Ashing   [facility] [phone]  Case [id number]

## 2020-04-02 NOTE — ED Triage Notes (Signed)
Patient here from home reporting sudden onset of left leg weakness that started while in shower today. Reports now has tingling in left arm and pain and pressure in neck. Unable to apply pressure on left leg when standing "feels heavy". AAO x4. Answering questions, no facial droop noted.

## 2020-04-02 NOTE — ED Provider Notes (Signed)
[facility]-EMERGENCY DEPT Provider Note   CSN: [id number] Arrival date & time: 04/02/20  2058     History Chief Complaint  Patient presents with  . Extremity Weakness  . Neck Pain    Tracey [name] is a 45 y.o. female.  HPI   Patient presents to the ED for evaluation of acute onset of leg numbness and weakness as well as tingling in her left arm.  She states she was in the shower when she had sudden onset around 18 p.m. of feeling that her left leg was very heavy.  She felt like it was under water trying to move against the pressure.  Called the nurse hotline and was instructed to come to the ED.  Patient also been started experiencing tingling in her arm and a pressure in the back of her neck.  She denies any trouble with her vision.  No trouble with her speech  Past Medical History:  Diagnosis Date  . Anxiety   . Bronchitis   . Carpal tunnel syndrome on both sides   . Depression   . Family history of adverse reaction to anesthesia    mother had problems waking up from surgery.  . Gestational diabetes mellitus    Normal 2 hr GTT postpartum  . Headache    migraines  . Hypertension   . Oligohydramnios antepartum 11/09/2017  . Panic attack 2017   diagnosed 3 months ago. no meds presently  . Reflux    did not filll prescription    Patient Active Problem List   Diagnosis Date Noted  . Anxiety 11/27/2017  . Vitamin D deficiency 06/07/2017  . History of prior pregnancy with IUGR newborn 06/04/2017  . Anxiety, generalized 02/09/2016    Past Surgical History:  Procedure Laterality Date  . BREAST BIOPSY  right    cyst  . DILATION AND EVACUATION N/A 07/26/2015   Procedure: DILATATION AND EVACUATION;  Surgeon: Brock Bad, MD;  Location: [facility];  Service: Gynecology;  Laterality: N/A;     OB History    Gravida  5   Para  3   Term  2   Preterm  1   AB  2   Living  3     SAB  2   IAB  0   Ectopic  0   Multiple  0   Live Births  3            Family History  Problem Relation Age of Onset  . Hypertension Mother   . Diabetes Maternal Aunt   . Diabetes Maternal Uncle     Social History   Tobacco Use  . Smoking status: Current Some Day Smoker    Packs/day: 0.25    Types: Cigarettes  . Smokeless tobacco: Never Used  . Tobacco comment: 2 cig/day  Vaping Use  . Vaping Use: Never used  Substance Use Topics  . Alcohol use: No    Alcohol/week: 0.0 standard drinks  . Drug use: No    Home Medications Prior to Admission medications   Medication Sig Start Date End Date Taking? Authorizing Provider  lisinopril (PRINIVIL,ZESTRIL) 20 MG tablet Take 20 mg by mouth daily.    [provider]  pantoprazole (PROTONIX) 40 MG tablet Take 1 tablet (40 mg total) by mouth daily. 09/06/17 09/06/18  Adam Phenix, MD  Prenatal 27-1 MG TABS TAKE 1 TABLET BY MOUTH DAILY BEFORE BREAKFAST 11/20/18   Brock Bad, MD  Probiotic Product (PROBIOTIC DAILY  PO) Take 1 tablet by mouth daily.     [provider]  Vitamin D, Ergocalciferol, (DRISDOL) 50000 units CAPS capsule Take 1 capsule (50,000 Units total) by mouth every 7 (seven) days. 06/07/17   Roe Coombsenney, Rachelle A, CNM    Allergies    Patient has no known allergies.  Review of Systems   Review of Systems  All other systems reviewed and are negative.   Physical Exam Updated Vital Signs BP (!) 150/95 (BP Location: Left Arm)   Pulse 72   Temp 98.4 F (36.9 C) (Oral)   Resp 17   Ht 1.676 m (5\' 6" )   Wt 97.5 kg   SpO2 100%   BMI 34.70 kg/m   Physical Exam Vitals and nursing note reviewed.  Constitutional:      General: She is not in acute distress.    Appearance: She is well-developed and well-nourished.  HENT:     Head: Normocephalic and atraumatic.     Right Ear: External ear normal.     Left Ear: External ear normal.     Mouth/Throat:     Mouth: Oropharynx is clear and moist.  Eyes:     General: No scleral icterus.       Right eye: No  discharge.        Left eye: No discharge.     Conjunctiva/sclera: Conjunctivae normal.  Neck:     Trachea: No tracheal deviation.  Cardiovascular:     Rate and Rhythm: Normal rate and regular rhythm.     Pulses: Intact distal pulses.  Pulmonary:     Effort: Pulmonary effort is normal. No respiratory distress.     Breath sounds: Normal breath sounds. No stridor. No wheezing or rales.  Abdominal:     General: Bowel sounds are normal. There is no distension.     Palpations: Abdomen is soft.     Tenderness: There is no abdominal tenderness. There is no guarding or rebound.  Musculoskeletal:        General: No tenderness or edema.     Cervical back: Neck supple.  Skin:    General: Skin is warm and dry.     Findings: No rash.  Neurological:     Mental Status: She is alert and oriented to person, place, and time.     Cranial Nerves: No cranial nerve deficit (No facial droop, extraocular movements intact, tongue midline ).     Sensory: No sensory deficit.     Motor: No abnormal muscle tone or seizure activity.     Coordination: Coordination normal.     Deep Tendon Reflexes: Strength normal.     Comments: No pronator drift bilateral upper extrem, able to hold both legs off bed for 5 seconds (?slight drift lle), sensation intact in all extremities, altered sensation left lower extremity, no visual field cuts, no left or right sided neglect, normal finger-nose exam bilaterally, no nystagmus noted, difficulty with heel to shin lle   Psychiatric:        Mood and Affect: Mood and affect normal.     ED Results / Procedures / Treatments   Labs (all labs ordered are listed, but only abnormal results are displayed) Labs Reviewed  URINALYSIS, ROUTINE W REFLEX MICROSCOPIC - Abnormal; Notable for the following components:      Result Value   Color, Urine STRAW (*)    Specific Gravity, Urine 1.004 (*)    Leukocytes,Ua SMALL (*)    Bacteria, UA RARE (*)    All  other components within normal  limits  COMPREHENSIVE METABOLIC PANEL - Abnormal; Notable for the following components:   Potassium 3.3 (*)    Creatinine, Ser 1.05 (*)    All other components within normal limits  LIPID PANEL - Abnormal; Notable for the following components:   HDL 37 (*)    LDL Cholesterol 101 (*)    All other components within normal limits  COMPREHENSIVE METABOLIC PANEL - Abnormal; Notable for the following components:   Potassium 3.4 (*)    Glucose, Bld 125 (*)    Creatinine, Ser 1.07 (*)    Calcium 8.5 (*)    Total Protein 6.2 (*)    AST 13 (*)    All other components within normal limits  I-STAT CHEM 8, ED - Abnormal; Notable for the following components:   Creatinine, Ser 1.10 (*)    All other components within normal limits  SARS CORONAVIRUS 2 (TAT 6-24 HRS)  CBC  ETHANOL  PROTIME-INR  APTT  RAPID URINE DRUG SCREEN, HOSP PERFORMED  DIFFERENTIAL  HEMOGLOBIN A1C  CBC  HIV ANTIBODY (ROUTINE TESTING W REFLEX)  CBG MONITORING, ED  I-STAT BETA HCG BLOOD, ED ([facility], [facility], [facility] ONLY)  I-STAT BETA HCG BLOOD, ED ([facility], [facility], [facility] ONLY)    EKG EKG Interpretation  Date/Time:  Friday April 02 2020 22:02:01 EST Ventricular Rate:  63 PR Interval:    QRS Duration: 105 QT Interval:  418 QTC Calculation: 428 R Axis:   70 Text Interpretation: Sinus rhythm Normal ECG When compared with ECG of 11/12/2014, No significant change was found Confirmed by Dione BoozeGlick, David ([id number]) on 04/02/2020 11:58:40 PM   Radiology CT Angio Head W or Wo Contrast  Result Date: 04/02/2020 CLINICAL DATA:  Left lower extremity weakness EXAM: CT ANGIOGRAPHY HEAD AND NECK TECHNIQUE: Multidetector CT imaging of the head and neck was performed using the standard protocol during bolus administration of intravenous contrast. Multiplanar CT image reconstructions and MIPs were obtained to evaluate the vascular anatomy. Carotid stenosis measurements (when applicable) are obtained utilizing NASCET criteria, using the distal internal carotid  diameter as the denominator. CONTRAST:  60mL OMNIPAQUE IOHEXOL 350 MG/ML SOLN COMPARISON:  None. FINDINGS: CTA NECK FINDINGS SKELETON: There is no bony spinal canal stenosis. No lytic or blastic lesion. OTHER NECK: Normal pharynx, larynx and major salivary glands. No cervical lymphadenopathy. Unremarkable thyroid gland. UPPER CHEST: No pneumothorax or pleural effusion. No nodules or masses. AORTIC ARCH: There is no calcific atherosclerosis of the aortic arch. There is no aneurysm, dissection or hemodynamically significant stenosis of the visualized portion of the aorta. Conventional 3 vessel aortic branching pattern. The visualized proximal subclavian arteries are widely patent. RIGHT CAROTID SYSTEM: Normal without aneurysm, dissection or stenosis. LEFT CAROTID SYSTEM: Normal without aneurysm, dissection or stenosis. VERTEBRAL ARTERIES: Left dominant configuration. Both origins are clearly patent. There is no dissection, occlusion or flow-limiting stenosis to the skull base (V1-V3 segments). CTA HEAD FINDINGS POSTERIOR CIRCULATION: --Vertebral arteries: Normal V4 segments. --Inferior cerebellar arteries: Normal. --Basilar artery: Normal. --Superior cerebellar arteries: Normal. --Posterior cerebral arteries (PCA): Normal. ANTERIOR CIRCULATION: --Intracranial internal carotid arteries: Normal. --Anterior cerebral arteries (ACA): Normal. Both A1 segments are present. Patent anterior communicating artery (a-comm). --Middle cerebral arteries (MCA): Normal. VENOUS SINUSES: As permitted by contrast timing, patent. ANATOMIC VARIANTS: None Review of the MIP images confirms the above findings. IMPRESSION: Normal CTA of the head and neck. Electronically Signed   By: Deatra RobinsonKevin  Herman M.D.   On: 04/02/2020 22:33   CT Angio Neck W and/or Wo  Contrast  Result Date: 04/02/2020 CLINICAL DATA:  Left lower extremity weakness EXAM: CT ANGIOGRAPHY HEAD AND NECK TECHNIQUE: Multidetector CT imaging of the head and neck was performed using  the standard protocol during bolus administration of intravenous contrast. Multiplanar CT image reconstructions and MIPs were obtained to evaluate the vascular anatomy. Carotid stenosis measurements (when applicable) are obtained utilizing NASCET criteria, using the distal internal carotid diameter as the denominator. CONTRAST:  67mL OMNIPAQUE IOHEXOL 350 MG/ML SOLN COMPARISON:  None. FINDINGS: CTA NECK FINDINGS SKELETON: There is no bony spinal canal stenosis. No lytic or blastic lesion. OTHER NECK: Normal pharynx, larynx and major salivary glands. No cervical lymphadenopathy. Unremarkable thyroid gland. UPPER CHEST: No pneumothorax or pleural effusion. No nodules or masses. AORTIC ARCH: There is no calcific atherosclerosis of the aortic arch. There is no aneurysm, dissection or hemodynamically significant stenosis of the visualized portion of the aorta. Conventional 3 vessel aortic branching pattern. The visualized proximal subclavian arteries are widely patent. RIGHT CAROTID SYSTEM: Normal without aneurysm, dissection or stenosis. LEFT CAROTID SYSTEM: Normal without aneurysm, dissection or stenosis. VERTEBRAL ARTERIES: Left dominant configuration. Both origins are clearly patent. There is no dissection, occlusion or flow-limiting stenosis to the skull base (V1-V3 segments). CTA HEAD FINDINGS POSTERIOR CIRCULATION: --Vertebral arteries: Normal V4 segments. --Inferior cerebellar arteries: Normal. --Basilar artery: Normal. --Superior cerebellar arteries: Normal. --Posterior cerebral arteries (PCA): Normal. ANTERIOR CIRCULATION: --Intracranial internal carotid arteries: Normal. --Anterior cerebral arteries (ACA): Normal. Both A1 segments are present. Patent anterior communicating artery (a-comm). --Middle cerebral arteries (MCA): Normal. VENOUS SINUSES: As permitted by contrast timing, patent. ANATOMIC VARIANTS: None Review of the MIP images confirms the above findings. IMPRESSION: Normal CTA of the head and neck.  Electronically Signed   By: Deatra Robinson M.D.   On: 04/02/2020 22:33   CT HEAD CODE STROKE WO CONTRAST  Result Date: 04/02/2020 CLINICAL DATA:  Code stroke. Sudden onset left lower extremity weakness EXAM: CT HEAD WITHOUT CONTRAST TECHNIQUE: Contiguous axial images were obtained from the base of the skull through the vertex without intravenous contrast. COMPARISON:  None. FINDINGS: Brain: There is no mass, hemorrhage or extra-axial collection. The size and configuration of the ventricles and extra-axial CSF spaces are normal. The brain parenchyma is normal, without evidence of acute or chronic infarction. Vascular: No abnormal hyperdensity of the major intracranial arteries or dural venous sinuses. No intracranial atherosclerosis. Skull: The visualized skull base, calvarium and extracranial soft tissues are normal. Sinuses/Orbits: No fluid levels or advanced mucosal thickening of the visualized paranasal sinuses. No mastoid or middle ear effusion. The orbits are normal. ASPECTS [facility] Stroke Program Early CT Score) - Ganglionic level infarction (caudate, lentiform nuclei, internal capsule, insula, M1-M3 cortex): 7 - Supraganglionic infarction (M4-M6 cortex): 3 Total score (0-10 with 10 being normal): 10 IMPRESSION: 1. Normal head CT. 2. ASPECTS is 10. These results were called by telephone at the time of interpretation on 04/02/2020 at 10:19 pm to provider [facility] , who verbally acknowledged these results. Electronically Signed   By: Deatra Robinson M.D.   On: 04/02/2020 22:19    Procedures .Critical Care Performed by: Linwood Dibbles, MD Authorized by: Linwood Dibbles, MD   Critical care provider statement:    Critical care time (minutes):  30   Critical care was time spent personally by me on the following activities:  Discussions with consultants, evaluation of patient's response to treatment, examination of patient, ordering and performing treatments and interventions, ordering and review of laboratory  studies, ordering and review of radiographic studies,  pulse oximetry, re-evaluation of patient's condition, obtaining history from patient or surrogate and review of old charts   (including critical care time)  Medications Ordered in ED Medications  sodium chloride 0.9 % bolus 500 mL (has no administration in time range)    Followed by  0.9 %  sodium chloride infusion (has no administration in time range)    ED Course  I have reviewed the triage vital signs and the nursing notes.  Pertinent labs & imaging results that were available during my care of the patient were reviewed by me and considered in my medical decision making (see chart for details).  Clinical Course as of 04/03/20 0843  Caleen Essex Apr 02, 2020  2218 Head ct negative.  Aspects score 10 [JK]  2246 Discussed with neurology.  TPA discussed.  Pt opts to not proceed with TPA.  WIll admit for stroke workup. [JK]    Clinical Course User Index [JK] Linwood Dibbles, MD   MDM Rules/Calculators/A&P                         Pt with acute onset weakness numbness.   No complaints of back pain.   Doubt sciatica.  Sx concerning for possible acute stroke.  MS also in the differential.  Plan on code stroke activation.  Pt presented with acute weakness.  Primarily left lower extremity but also sx lue.  Code stroke activated.  No acute findings noted on head ct and ct angio head and neck.   Pt opted to not proceed with TPA, NIHSS stroke scale low.  Plan on admission for further stroke workup.  Pt remained in stable condition.  Neuro exam stable. Final Clinical Impression(s) / ED Diagnoses Final diagnoses:  Left leg weakness      Linwood Dibbles, MD 04/03/20 [phone]

## 2020-04-02 NOTE — H&P (Signed)
History and Physical    Sundae Maners OFB:[id number] DOB: 16-Aug-1975 DOA: 04/02/2020  PCP: Sigmund Hazel, MD   Patient coming from: Home   Chief Complaint: Acute-onset left leg weakness and numbness, left arm numbness, neck discomfort  HPI: Tracey [name] is a 45 y.o. female with medical history significant for hypertension, depression, anxiety, and headaches, now presenting to the emergency department for evaluation of acute onset neck discomfort, left leg numbness and weakness, and left arm numbness.  Patient had just started Wellbutrin today, was in her usual state of health, and then at 8:30 PM developed cute onset of numbness and weakness involving the left leg, numbness involving the left arm, and a pressure sensation at the base of her posterior neck in the midline.  There was no associated headache or change in vision, no recent fall or trauma, no fevers or chills, no facial symptoms, no speech difficulty.  She has never experienced similar symptoms before.  ED Course: Upon arrival to the ED, patient is found to be afebrile, saturating well on room air, and with stable blood pressure.  EKG features sinus rhythm.  Chemistry panel notable for mild hypokalemia.  CBC unremarkable.  CT head and CTA head and neck were normal studies.  COVID-19 screening test pending.  Teleneurology evaluated the patient and made recommendations including admission, MRI brain with and without contrast, and echocardiogram.  Patient was started on IV fluids.  She passed swallow screen in the ED.  Review of Systems:  All other systems reviewed and apart from HPI, are negative.  Past Medical History:  Diagnosis Date  . Anxiety   . Bronchitis   . Carpal tunnel syndrome on both sides   . Depression   . Family history of adverse reaction to anesthesia    mother had problems waking up from surgery.  . Gestational diabetes mellitus    Normal 2 hr GTT postpartum  . Headache    migraines  . Hypertension   .  Oligohydramnios antepartum 11/09/2017  . Panic attack 2017   diagnosed 3 months ago. no meds presently  . Reflux    did not filll prescription    Past Surgical History:  Procedure Laterality Date  . BREAST BIOPSY  right    cyst  . DILATION AND EVACUATION N/A 07/26/2015   Procedure: DILATATION AND EVACUATION;  Surgeon: Brock Bad, MD;  Location: [facility];  Service: Gynecology;  Laterality: N/A;    Social History:   reports that she has been smoking cigarettes. She has been smoking about 0.25 packs per day. She has never used smokeless tobacco. She reports that she does not drink alcohol and does not use drugs.  No Known Allergies  Family History  Problem Relation Age of Onset  . Hypertension Mother   . Diabetes Maternal Aunt   . Diabetes Maternal Uncle      Prior to Admission medications   Medication Sig Start Date End Date Taking? Authorizing Provider  buPROPion (WELLBUTRIN) 75 MG tablet Take 75 mg by mouth 2 (two) times daily. 03/23/20  Yes [provider]  COD LIVER OIL PO Take 1 capsule by mouth daily.   Yes [provider]  hydrOXYzine (VISTARIL) 25 MG capsule Take 25 mg by mouth daily as needed. 03/23/20  Yes [provider]  lisinopril (PRINIVIL,ZESTRIL) 20 MG tablet Take 20 mg by mouth daily.   Yes [provider]  Multiple Vitamins-Minerals (MULTIVITAMIN WITH MINERALS) tablet Take 1 tablet by mouth daily.   Yes [provider]  Probiotic Product (PROBIOTIC DAILY PO) Take 1 tablet by mouth daily.    Yes [provider]  Vitamin D, Ergocalciferol, (DRISDOL) 50000 units CAPS capsule Take 1 capsule (50,000 Units total) by mouth every 7 (seven) days. 06/07/17  Yes Denney, Rachelle A, CNM  pantoprazole (PROTONIX) 40 MG tablet Take 1 tablet (40 mg total) by mouth daily. Patient not taking: Reported on 04/03/2020 09/06/17 09/06/18  Adam Phenix, MD  Prenatal 27-1 MG TABS TAKE 1 TABLET BY MOUTH DAILY BEFORE BREAKFAST Patient not  taking: Reported on 04/03/2020 11/20/18   Brock Bad, MD    Physical Exam: Vitals:   04/02/20 2315 04/02/20 2330 04/02/20 2345 04/03/20 0000  BP: (!) 156/73 (!) 167/111 (!) 144/87 (!) 152/84  Pulse: (!) 58 (!) 57 (!) 56 61  Resp: 16 19 18 15   Temp:      TempSrc:      SpO2: 90% 99% 99% 94%  Weight:      Height:        Constitutional: NAD, calm  Eyes: PERTLA, lids and conjunctivae normal ENMT: Mucous membranes are moist. Posterior pharynx clear of any exudate or lesions.   Neck: normal, supple, no masses, no thyromegaly Respiratory: no wheezing, no crackles. No accessory muscle use.  Cardiovascular: S1 & S2 heard, regular rate and rhythm. No extremity edema.   Abdomen: No distension, no tenderness, soft. Bowel sounds active.  Musculoskeletal: no clubbing / cyanosis. No joint deformity upper and lower extremities.   Skin: no significant rashes, lesions, ulcers. Warm, dry, well-perfused. Neurologic: CN 2-12 grossly intact. Sensation to light touch diminished in distal LLE and LUE. Patellar DTR normal. Strength 5/5 in all 4 limbs.  Psychiatric: Alert and oriented to person, place, and situation. Very pleasant and cooperative.    Labs and Imaging on Admission: I have personally reviewed following labs and imaging studies  CBC: Recent Labs  Lab 04/02/20 2117 04/02/20 2250  WBC 9.6  --   NEUTROABS 4.7  --   HGB 13.6 14.3  HCT 41.5 42.0  MCV 89.4  --   PLT 267  --    Basic Metabolic Panel: Recent Labs  Lab 04/02/20 2155 04/02/20 2250  NA 139 139  K 3.3* 3.7  CL 105 105  CO2 23  --   GLUCOSE 93 94  BUN 10 10  CREATININE 1.05* 1.10*  CALCIUM 9.4  --    GFR: Estimated Creatinine Clearance: 78.5 mL/min (A) (by C-G formula based on SCr of 1.1 mg/dL (H)). Liver Function Tests: Recent Labs  Lab 04/02/20 2155  AST 17  ALT 22  ALKPHOS 47  BILITOT 0.3  PROT 7.6  ALBUMIN 4.3   No results for input(s): LIPASE, AMYLASE in the last 168 hours. No results for  input(s): AMMONIA in the last 168 hours. Coagulation Profile: Recent Labs  Lab 04/02/20 2155  INR 0.9   Cardiac Enzymes: No results for input(s): CKTOTAL, CKMB, CKMBINDEX, TROPONINI in the last 168 hours. BNP (last 3 results) No results for input(s): PROBNP in the last 8760 hours. HbA1C: No results for input(s): HGBA1C in the last 72 hours. CBG: Recent Labs  Lab 04/02/20 2153  GLUCAP 97   Lipid Profile: No results for input(s): CHOL, HDL, LDLCALC, TRIG, CHOLHDL, LDLDIRECT in the last 72 hours. Thyroid Function Tests: No results for input(s): TSH, T4TOTAL, FREET4, T3FREE, THYROIDAB in the last 72 hours. Anemia Panel: No results for input(s): VITAMINB12, FOLATE, FERRITIN, TIBC, IRON, RETICCTPCT in the last 72 hours. Urine analysis:  Component Value Date/Time   COLORURINE STRAW (A) 04/02/2020 2117   APPEARANCEUR CLEAR 04/02/2020 2117   LABSPEC 1.004 (L) 04/02/2020 2117   PHURINE 7.0 04/02/2020 2117   GLUCOSEU NEGATIVE 04/02/2020 2117   HGBUR NEGATIVE 04/02/2020 2117   BILIRUBINUR NEGATIVE 04/02/2020 2117   BILIRUBINUR neg 10/10/2017 1604   KETONESUR NEGATIVE 04/02/2020 2117   PROTEINUR NEGATIVE 04/02/2020 2117   UROBILINOGEN 1.0 09/23/2010 1605   NITRITE NEGATIVE 04/02/2020 2117   LEUKOCYTESUR SMALL (A) 04/02/2020 2117   Sepsis Labs: @LABRCNTIP (procalcitonin:4,lacticidven:4) )No results found for this or any previous visit (from the past 240 hour(s)).   Radiological Exams on Admission: CT Angio Head W or Wo Contrast  Result Date: 04/02/2020 CLINICAL DATA:  Left lower extremity weakness EXAM: CT ANGIOGRAPHY HEAD AND NECK TECHNIQUE: Multidetector CT imaging of the head and neck was performed using the standard protocol during bolus administration of intravenous contrast. Multiplanar CT image reconstructions and MIPs were obtained to evaluate the vascular anatomy. Carotid stenosis measurements (when applicable) are obtained utilizing NASCET criteria, using the distal  internal carotid diameter as the denominator. CONTRAST:  60mL OMNIPAQUE IOHEXOL 350 MG/ML SOLN COMPARISON:  None. FINDINGS: CTA NECK FINDINGS SKELETON: There is no bony spinal canal stenosis. No lytic or blastic lesion. OTHER NECK: Normal pharynx, larynx and major salivary glands. No cervical lymphadenopathy. Unremarkable thyroid gland. UPPER CHEST: No pneumothorax or pleural effusion. No nodules or masses. AORTIC ARCH: There is no calcific atherosclerosis of the aortic arch. There is no aneurysm, dissection or hemodynamically significant stenosis of the visualized portion of the aorta. Conventional 3 vessel aortic branching pattern. The visualized proximal subclavian arteries are widely patent. RIGHT CAROTID SYSTEM: Normal without aneurysm, dissection or stenosis. LEFT CAROTID SYSTEM: Normal without aneurysm, dissection or stenosis. VERTEBRAL ARTERIES: Left dominant configuration. Both origins are clearly patent. There is no dissection, occlusion or flow-limiting stenosis to the skull base (V1-V3 segments). CTA HEAD FINDINGS POSTERIOR CIRCULATION: --Vertebral arteries: Normal V4 segments. --Inferior cerebellar arteries: Normal. --Basilar artery: Normal. --Superior cerebellar arteries: Normal. --Posterior cerebral arteries (PCA): Normal. ANTERIOR CIRCULATION: --Intracranial internal carotid arteries: Normal. --Anterior cerebral arteries (ACA): Normal. Both A1 segments are present. Patent anterior communicating artery (a-comm). --Middle cerebral arteries (MCA): Normal. VENOUS SINUSES: As permitted by contrast timing, patent. ANATOMIC VARIANTS: None Review of the MIP images confirms the above findings. IMPRESSION: Normal CTA of the head and neck. Electronically Signed   By: Deatra RobinsonKevin  Herman M.D.   On: 04/02/2020 22:33   CT Angio Neck W and/or Wo Contrast  Result Date: 04/02/2020 CLINICAL DATA:  Left lower extremity weakness EXAM: CT ANGIOGRAPHY HEAD AND NECK TECHNIQUE: Multidetector CT imaging of the head and neck  was performed using the standard protocol during bolus administration of intravenous contrast. Multiplanar CT image reconstructions and MIPs were obtained to evaluate the vascular anatomy. Carotid stenosis measurements (when applicable) are obtained utilizing NASCET criteria, using the distal internal carotid diameter as the denominator. CONTRAST:  60mL OMNIPAQUE IOHEXOL 350 MG/ML SOLN COMPARISON:  None. FINDINGS: CTA NECK FINDINGS SKELETON: There is no bony spinal canal stenosis. No lytic or blastic lesion. OTHER NECK: Normal pharynx, larynx and major salivary glands. No cervical lymphadenopathy. Unremarkable thyroid gland. UPPER CHEST: No pneumothorax or pleural effusion. No nodules or masses. AORTIC ARCH: There is no calcific atherosclerosis of the aortic arch. There is no aneurysm, dissection or hemodynamically significant stenosis of the visualized portion of the aorta. Conventional 3 vessel aortic branching pattern. The visualized proximal subclavian arteries are widely patent. RIGHT CAROTID SYSTEM: Normal without aneurysm,  dissection or stenosis. LEFT CAROTID SYSTEM: Normal without aneurysm, dissection or stenosis. VERTEBRAL ARTERIES: Left dominant configuration. Both origins are clearly patent. There is no dissection, occlusion or flow-limiting stenosis to the skull base (V1-V3 segments). CTA HEAD FINDINGS POSTERIOR CIRCULATION: --Vertebral arteries: Normal V4 segments. --Inferior cerebellar arteries: Normal. --Basilar artery: Normal. --Superior cerebellar arteries: Normal. --Posterior cerebral arteries (PCA): Normal. ANTERIOR CIRCULATION: --Intracranial internal carotid arteries: Normal. --Anterior cerebral arteries (ACA): Normal. Both A1 segments are present. Patent anterior communicating artery (a-comm). --Middle cerebral arteries (MCA): Normal. VENOUS SINUSES: As permitted by contrast timing, patent. ANATOMIC VARIANTS: None Review of the MIP images confirms the above findings. IMPRESSION: Normal CTA of  the head and neck. Electronically Signed   By: Deatra Robinson M.D.   On: 04/02/2020 22:33   CT HEAD CODE STROKE WO CONTRAST  Result Date: 04/02/2020 CLINICAL DATA:  Code stroke. Sudden onset left lower extremity weakness EXAM: CT HEAD WITHOUT CONTRAST TECHNIQUE: Contiguous axial images were obtained from the base of the skull through the vertex without intravenous contrast. COMPARISON:  None. FINDINGS: Brain: There is no mass, hemorrhage or extra-axial collection. The size and configuration of the ventricles and extra-axial CSF spaces are normal. The brain parenchyma is normal, without evidence of acute or chronic infarction. Vascular: No abnormal hyperdensity of the major intracranial arteries or dural venous sinuses. No intracranial atherosclerosis. Skull: The visualized skull base, calvarium and extracranial soft tissues are normal. Sinuses/Orbits: No fluid levels or advanced mucosal thickening of the visualized paranasal sinuses. No mastoid or middle ear effusion. The orbits are normal. ASPECTS [facility] Stroke Program Early CT Score) - Ganglionic level infarction (caudate, lentiform nuclei, internal capsule, insula, M1-M3 cortex): 7 - Supraganglionic infarction (M4-M6 cortex): 3 Total score (0-10 with 10 being normal): 10 IMPRESSION: 1. Normal head CT. 2. ASPECTS is 10. These results were called by telephone at the time of interpretation on 04/02/2020 at 10:19 pm to provider [facility] , who verbally acknowledged these results. Electronically Signed   By: Deatra Robinson M.D.   On: 04/02/2020 22:19    EKG: Independently reviewed. Sinus rhythm.   Assessment/Plan   1. Left-side weakness and numbness - Presents with acute-onset numbness and weakness involving leg, numbness in left arm, and neck discomfort  - CT head and CTA head and neck are normal  - Per teleneuro recs, plan to continue cardiac monitoring and neuro checks, check MRI brain with and without contrast, echo, A1c, and fasting lipids, start ASA  81, and check MRI c- and t-spine if workup unrevealing    2. Hypertension  - Permit HTN to 185/110 per neuro recs, hold lisinopril initially    3. Depression, anxiety  - Patient just started Wellbutrin 1/14, will hold for now  - Continue as-needed Vistaril    4. Hypokalemia  - Serum potassium 3.3 in ED - Replaced, repeat chem panel in am     DVT prophylaxis: Lovenox  Code Status: Full  Family Communication: Discussed with patient  Disposition Plan:  Patient is from: Home  Anticipated d/c is to: Home  Anticipated d/c date is: 04/04/20 Patient currently: Pending MRI brain, TTE, neuro checks, cardiac monitoring, A1c, lipid panel, MRI c- and t-spine if brain MRI unremarkable  Consults called: Teleneurology  Admission status: Observation     Briscoe Deutscher, MD [facility]  04/03/2020, 12:37 AM

## 2020-04-02 NOTE — ED Notes (Signed)
Patient does not agree to TPA. Tele-neurologist addressed the risks and benefits. Patient understands.

## 2020-04-02 NOTE — ED Notes (Signed)
Code stroke called per Dr. Lynelle Doctor

## 2020-04-03 ENCOUNTER — Observation Stay (HOSPITAL_BASED_OUTPATIENT_CLINIC_OR_DEPARTMENT_OTHER): Payer: 59

## 2020-04-03 ENCOUNTER — Observation Stay (HOSPITAL_COMMUNITY): Payer: 59

## 2020-04-03 ENCOUNTER — Other Ambulatory Visit: Payer: Self-pay

## 2020-04-03 DIAGNOSIS — E876 Hypokalemia: Secondary | ICD-10-CM | POA: Diagnosis present

## 2020-04-03 DIAGNOSIS — F411 Generalized anxiety disorder: Secondary | ICD-10-CM | POA: Diagnosis not present

## 2020-04-03 DIAGNOSIS — M542 Cervicalgia: Secondary | ICD-10-CM | POA: Diagnosis not present

## 2020-04-03 DIAGNOSIS — I6389 Other cerebral infarction: Secondary | ICD-10-CM | POA: Diagnosis not present

## 2020-04-03 DIAGNOSIS — Z79899 Other long term (current) drug therapy: Secondary | ICD-10-CM | POA: Diagnosis not present

## 2020-04-03 DIAGNOSIS — R200 Anesthesia of skin: Secondary | ICD-10-CM | POA: Diagnosis not present

## 2020-04-03 DIAGNOSIS — F32A Depression, unspecified: Secondary | ICD-10-CM | POA: Diagnosis not present

## 2020-04-03 DIAGNOSIS — M6281 Muscle weakness (generalized): Secondary | ICD-10-CM | POA: Diagnosis present

## 2020-04-03 DIAGNOSIS — R29898 Other symptoms and signs involving the musculoskeletal system: Secondary | ICD-10-CM | POA: Diagnosis not present

## 2020-04-03 DIAGNOSIS — F17210 Nicotine dependence, cigarettes, uncomplicated: Secondary | ICD-10-CM | POA: Diagnosis not present

## 2020-04-03 DIAGNOSIS — I6330 Cerebral infarction due to thrombosis of unspecified cerebral artery: Secondary | ICD-10-CM

## 2020-04-03 DIAGNOSIS — I10 Essential (primary) hypertension: Secondary | ICD-10-CM | POA: Diagnosis not present

## 2020-04-03 DIAGNOSIS — Z20822 Contact with and (suspected) exposure to covid-19: Secondary | ICD-10-CM | POA: Diagnosis not present

## 2020-04-03 LAB — COMPREHENSIVE METABOLIC PANEL
ALT: 19 U/L (ref 0–44)
AST: 13 U/L — ABNORMAL LOW (ref 15–41)
Albumin: 3.5 g/dL (ref 3.5–5.0)
Alkaline Phosphatase: 41 U/L (ref 38–126)
Anion gap: 7 (ref 5–15)
BUN: 9 mg/dL (ref 6–20)
CO2: 24 mmol/L (ref 22–32)
Calcium: 8.5 mg/dL — ABNORMAL LOW (ref 8.9–10.3)
Chloride: 109 mmol/L (ref 98–111)
Creatinine, Ser: 1.07 mg/dL — ABNORMAL HIGH (ref 0.44–1.00)
GFR, Estimated: >60 mL/min (ref >60)
Glucose, Bld: 125 mg/dL — ABNORMAL HIGH (ref 70–99)
Potassium: 3.4 mmol/L — ABNORMAL LOW (ref 3.5–5.1)
Sodium: 140 mmol/L (ref 135–145)
Total Bilirubin: 0.3 mg/dL (ref 0.3–1.2)
Total Protein: 6.2 g/dL — ABNORMAL LOW (ref 6.5–8.1)

## 2020-04-03 LAB — CBC
HCT: 37.5 % (ref 36.0–46.0)
Hemoglobin: 12.3 g/dL (ref 12.0–15.0)
MCH: 29.6 pg (ref 26.0–34.0)
MCHC: 32.8 g/dL (ref 30.0–36.0)
MCV: 90.1 fL (ref 80.0–100.0)
Platelets: 249 10*3/uL (ref 150–400)
RBC: 4.16 MIL/uL (ref 3.87–5.11)
RDW: 13.8 % (ref 11.5–15.5)
WBC: 8.2 10*3/uL (ref 4.0–10.5)
nRBC: 0 % (ref 0.0–0.2)

## 2020-04-03 LAB — HEMOGLOBIN A1C
Hgb A1c MFr Bld: 5.1 % (ref 4.8–5.6)
Mean Plasma Glucose: 99.67 mg/dL

## 2020-04-03 LAB — LIPID PANEL
Cholesterol: 149 mg/dL (ref 0–200)
HDL: 37 mg/dL — ABNORMAL LOW (ref >40)
LDL Cholesterol: 101 mg/dL — ABNORMAL HIGH (ref 0–99)
Total CHOL/HDL Ratio: 4 RATIO
Triglycerides: 55 mg/dL (ref <150)
VLDL: 11 mg/dL (ref 0–40)

## 2020-04-03 LAB — ECHOCARDIOGRAM COMPLETE
Area-P 1/2: 3.77 cm2
Height: 66 in
S' Lateral: 3.4 cm
Weight: 3440 oz

## 2020-04-03 LAB — HIV ANTIBODY (ROUTINE TESTING W REFLEX): HIV Screen 4th Generation wRfx: NONREACTIVE

## 2020-04-03 LAB — SARS CORONAVIRUS 2 (TAT 6-24 HRS): SARS Coronavirus 2: NEGATIVE

## 2020-04-03 LAB — SEDIMENTATION RATE: Sed Rate: 9 mm/hr (ref 0–22)

## 2020-04-03 IMAGING — MR MR HEAD WO/W CM
12 of 13 series · 34 of 48 positions shown · IV contrast (gadavist)
Comparison: CT head April 02, 2020

CLINICAL DATA: Neuro deficit, acute stroke suspected.

EXAM:
MRI HEAD WITHOUT AND WITH CONTRAST
TECHNIQUE: Multiplanar, multiecho pulse sequences of the brain and surrounding
structures were obtained without and with intravenous contrast.
CONTRAST:  10mL GADAVIST GADOBUTROL 1 MMOL/ML IV SOLN

[Series 9: dwi_tracew · axial · 3.0mm · 0.88mm/px · 1 of 106 slices shown]
[im 1/106]
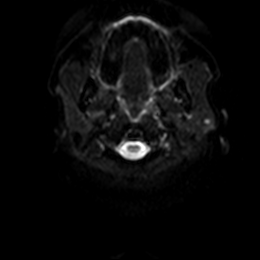

[Series 11: GRE · axial · 3.0mm · 0.43mm/px · z∈[-63,+93]mm · 5 of 53 slices shown]
[im 1/53]
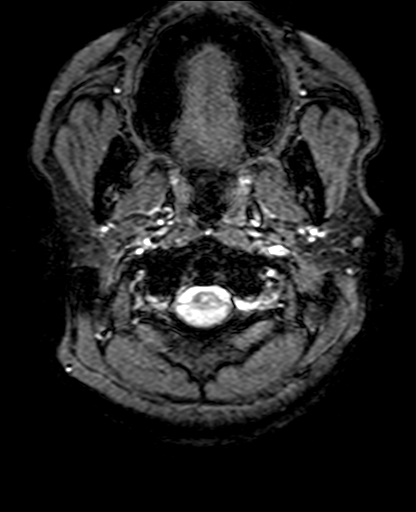
[im 14/53]
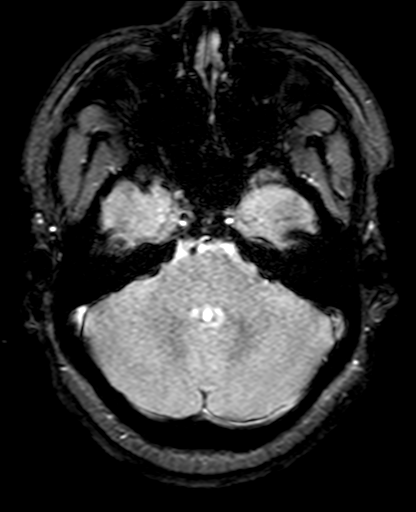
[im 27/53]
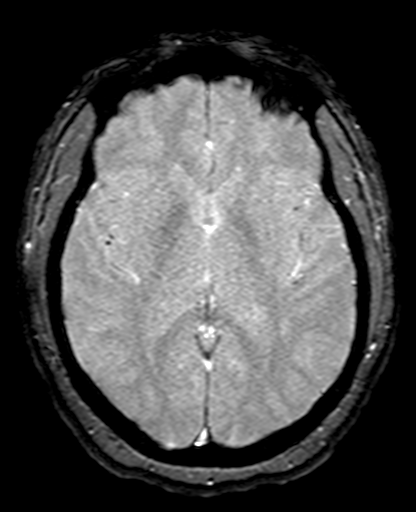
[im 40/53]
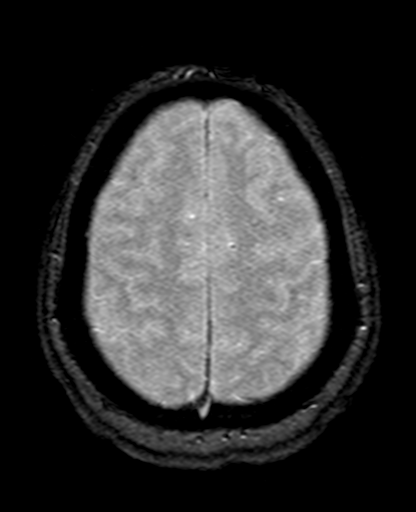
[im 53/53]
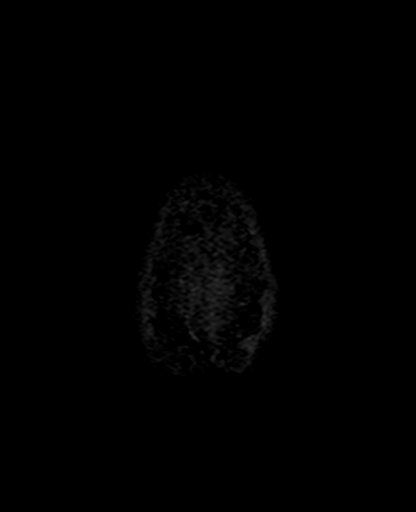

[Series 12: FLAIR · axial · 4.0mm · 0.86mm/px · z∈[-61,+91]mm · 3 of 39 slices shown]
[im 1/39]
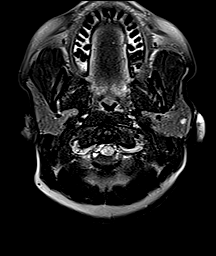
[im 20/39]
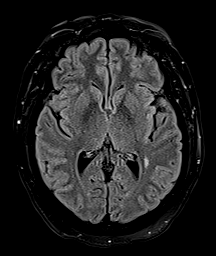
[im 39/39]
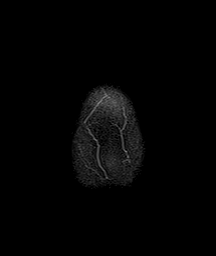

[Series 13: DWI · coronal · 5.0mm · 1.31mm/px · 5 of 56 slices shown (1 of 2)]
[im 1/56]
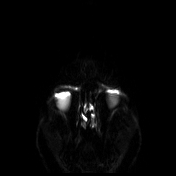
[im 14/56]
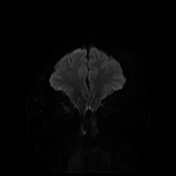
[im 28/56]
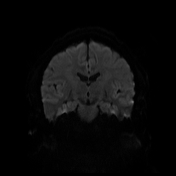
[im 42/56]
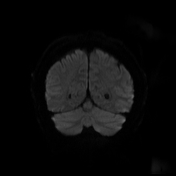
[im 56/56]
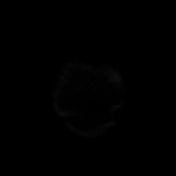

[Series 14: DWI · coronal · 5.0mm · 1.31mm/px · 2 of 28 slices shown (2 of 2)]
[im 1/28]
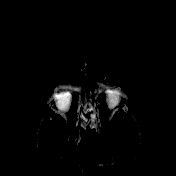
[im 28/28]
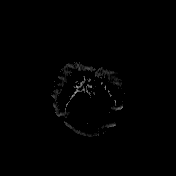

[Series 15: T2 · sagittal · 5.0mm · 0.47mm/px · 2 of 24 slices shown (1 of 3)]
[im 1/24]
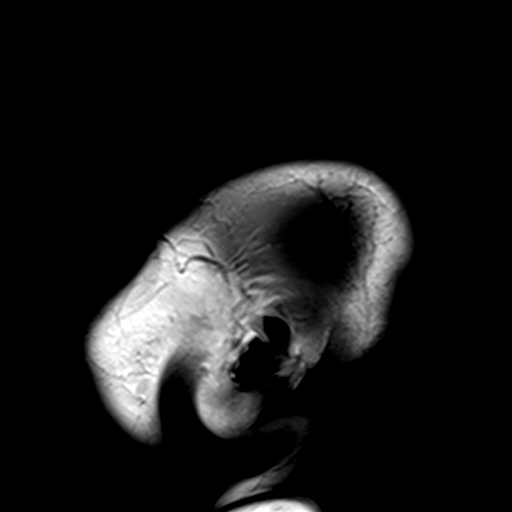
[im 24/24]
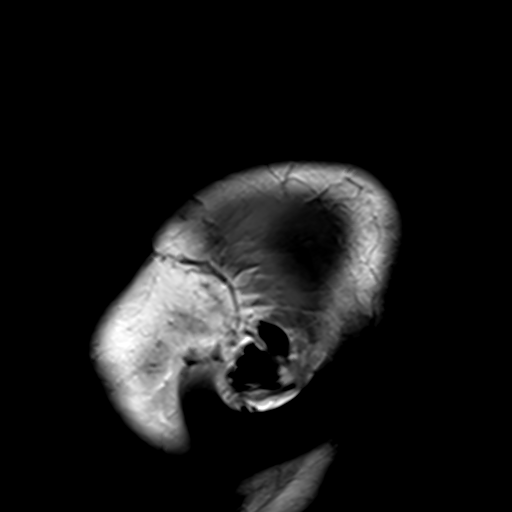

[Series 16: T2 · axial · 5.0mm · 0.43mm/px · z∈[-64,+92]mm · 2 of 25 slices shown (2 of 3)]
[im 1/25]
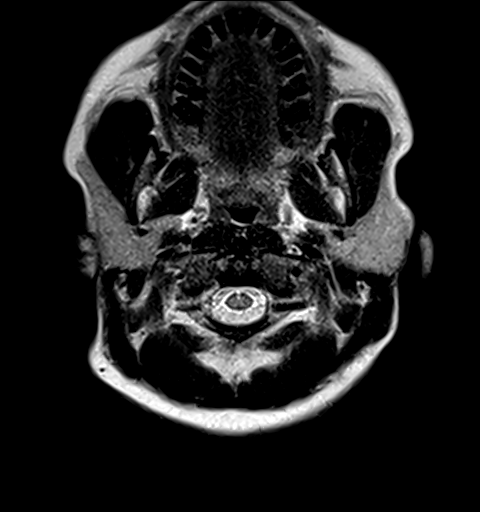
[im 25/25]
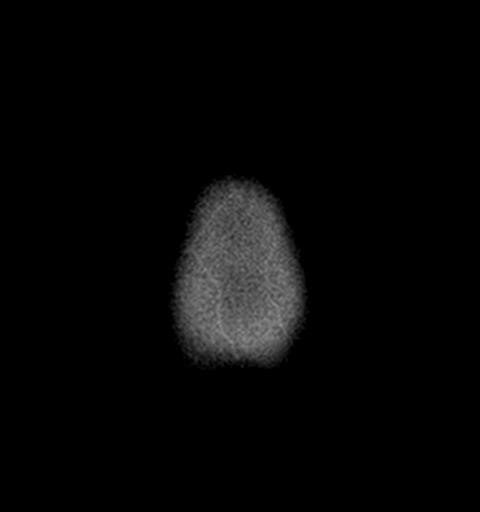

[Series 17: T1 · axial · 4.0mm · 0.43mm/px · z∈[-65,+90]mm · 4 of 40 slices shown]
[im 1/40]
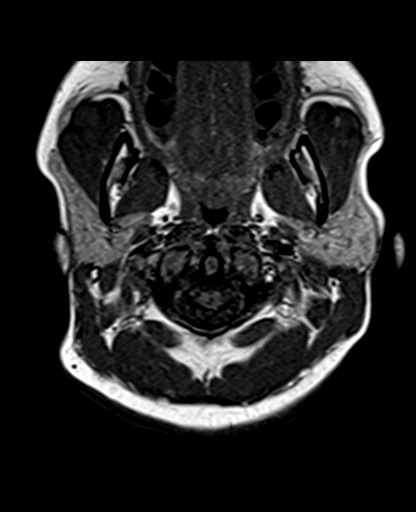
[im 14/40]
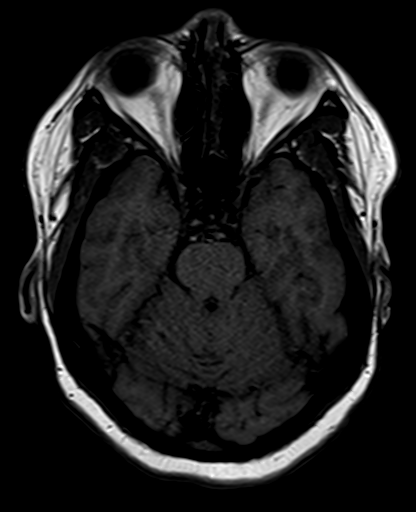
[im 27/40]
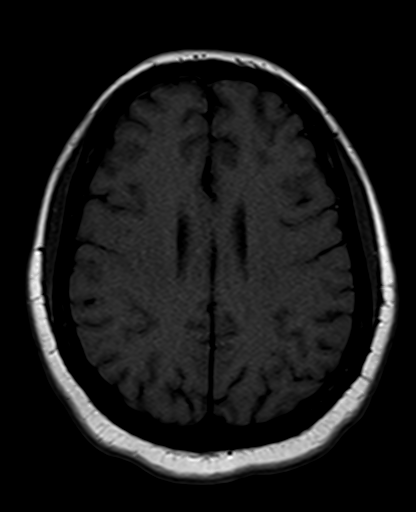
[im 40/40]
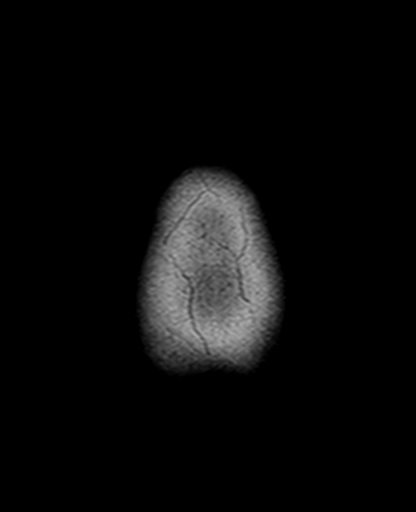

[Series 18: T2 · coronal · 5.0mm · 0.86mm/px · 2 of 28 slices shown (3 of 3)]
[im 1/28]
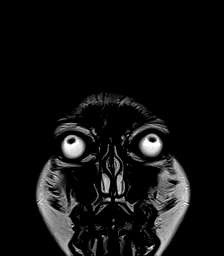
[im 28/28]
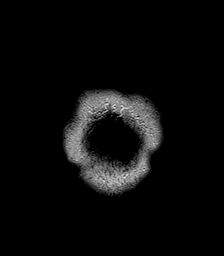

[Series 19: T1 post-contrast · axial · 4.0mm · 0.43mm/px · z∈[-65,+90]mm · 4 of 40 slices shown (1 of 3)]
[im 1/40]
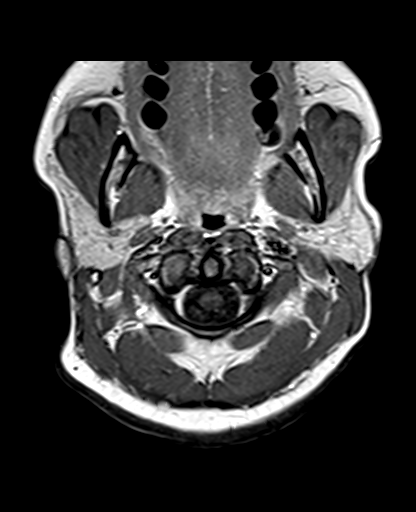
[im 14/40]
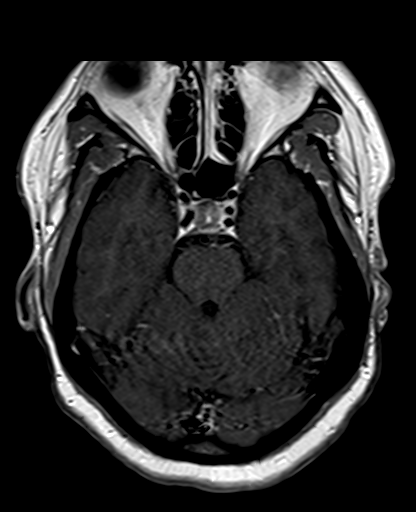
[im 27/40]
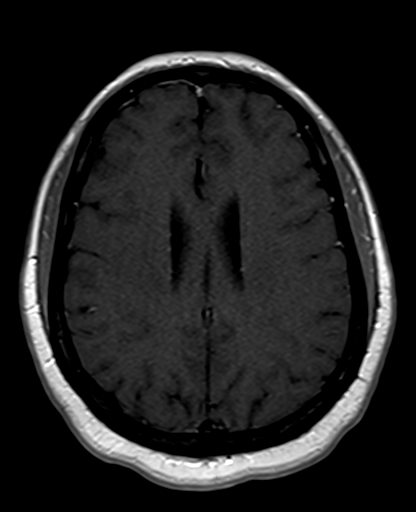
[im 40/40]
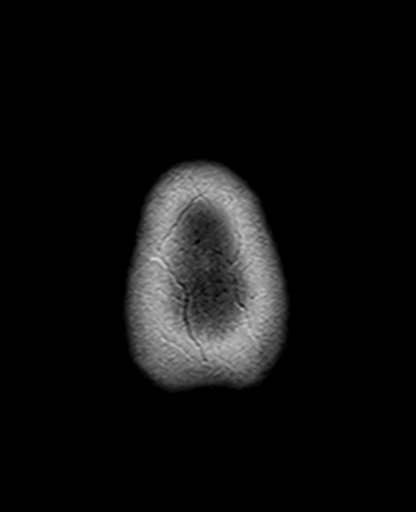

[Series 20: T1 post-contrast · coronal · 5.0mm · 0.43mm/px · 2 of 28 slices shown (2 of 3)]
[im 1/28]
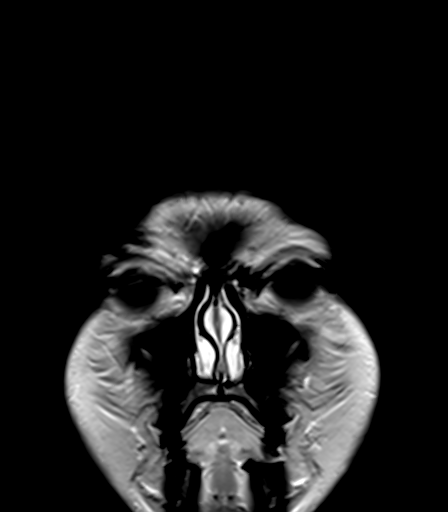
[im 28/28]
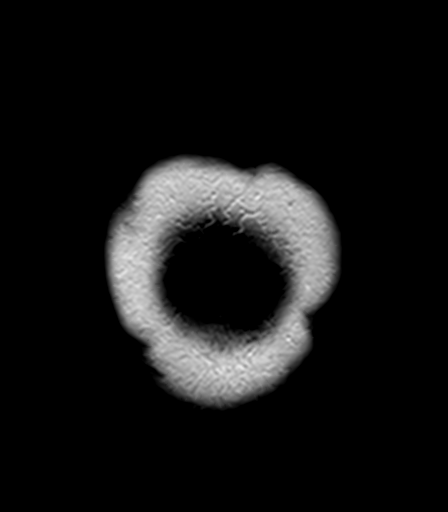

[Series 21: T1 post-contrast · sagittal · 5.0mm · 0.94mm/px · 2 of 24 slices shown (3 of 3)]
[im 1/24]
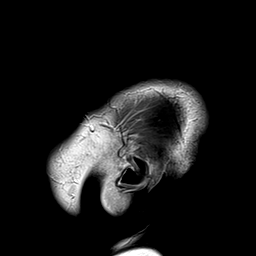
[im 24/24]
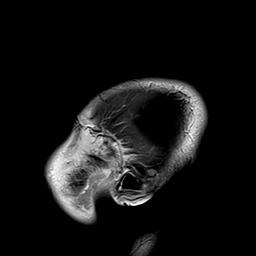

[34 of 48 positions shown; findings below may reference images not displayed]

FINDINGS: Brain: Small acute infarct in the juxtacortical high right posterior
frontal lobe (series 9, image 92). Minimal edema without mass
effect. No acute hemorrhage. Additional scattered mild T2/FLAIR
hyperintensities within the white matter, mildly advanced for age.
No hydrocephalus. No mass lesion. No abnormal enhancement. No
abnormal mass effect or extra-axial fluid collection.

Vascular: Major arterial flow voids are maintained at the skull
base.

Skull and upper cervical spine: No focal marrow signal abnormality.

Sinuses/Orbits: Visualized sinuses are largely clear. Unremarkable
orbits.

Other: No mastoid effusions.
IMPRESSION: 1. Small acute infarct in the juxtacortical high right posterior
frontal lobe.Minimal edema without mass effect.
2. Additional mild scattered T2/FLAIR hyperintensities within the
white matter, mildly advanced for age. These are nonspecific and
could relate to chronic microvascular ischemic disease, prior
migraines, trauma, demyelination, or inflammation.

## 2020-04-03 MED ORDER — HYDROXYZINE HCL 25 MG PO TABS
25.0000 mg | ORAL_TABLET | Freq: Three times a day (TID) | ORAL | Status: DC | PRN
  Administered 2020-04-03: 25 mg via ORAL
  Filled 2020-04-03: qty 1

## 2020-04-03 MED ORDER — ROSUVASTATIN CALCIUM 5 MG PO TABS
10.0000 mg | ORAL_TABLET | Freq: Every day | ORAL | Status: DC
  Administered 2020-04-03 – 2020-04-04 (×2): 10 mg via ORAL
  Filled 2020-04-03 (×2): qty 2

## 2020-04-03 MED ORDER — POTASSIUM CHLORIDE CRYS ER 20 MEQ PO TBCR
20.0000 meq | EXTENDED_RELEASE_TABLET | Freq: Once | ORAL | Status: AC
  Administered 2020-04-03: 20 meq via ORAL
  Filled 2020-04-03: qty 1

## 2020-04-03 MED ORDER — LORAZEPAM 2 MG/ML IJ SOLN: 1.0000 mg | Freq: Once | INTRAMUSCULAR | Status: DC | PRN

## 2020-04-03 MED ORDER — SODIUM CHLORIDE 0.9 % IV SOLN: INTRAVENOUS | Status: DC

## 2020-04-03 MED ORDER — CLOPIDOGREL BISULFATE 75 MG PO TABS
75.0000 mg | ORAL_TABLET | Freq: Every day | ORAL | Status: DC
  Administered 2020-04-03 – 2020-04-04 (×2): 75 mg via ORAL
  Filled 2020-04-03 (×2): qty 1

## 2020-04-03 MED ORDER — GADOBUTROL 1 MMOL/ML IV SOLN
10.0000 mL | Freq: Once | INTRAVENOUS | Status: AC | PRN
  Administered 2020-04-03: 10 mL via INTRAVENOUS

## 2020-04-03 NOTE — Progress Notes (Signed)
Echocardiogram 2D Echocardiogram has been performed.  Tracey [name] Tracey [name] 04/03/2020, 8:21 AM

## 2020-04-03 NOTE — Evaluation (Signed)
Speech Language Pathology Evaluation Patient Details Name: Tracey [name] MRN: [id number] DOB: 1975/10/03 Today's Date: 04/03/2020 Time: 1450-1510 SLP Time Calculation (min) (ACUTE ONLY): 20 min  Problem List:  Patient Active Problem List   Diagnosis Date Noted  . Hypokalemia 04/03/2020  . Left leg weakness 04/02/2020  . Hypertension   . Depression   . Anxiety 11/27/2017  . Vitamin D deficiency 06/07/2017  . History of prior pregnancy with IUGR newborn 06/04/2017  . Anxiety, generalized 02/09/2016   Past Medical History:  Past Medical History:  Diagnosis Date  . Anxiety   . Bronchitis   . Carpal tunnel syndrome on both sides   . Depression   . Family history of adverse reaction to anesthesia    mother had problems waking up from surgery.  . Gestational diabetes mellitus    Normal 2 hr GTT postpartum  . Headache    migraines  . Hypertension   . Oligohydramnios antepartum 11/09/2017  . Panic attack 2017   diagnosed 3 months ago. no meds presently  . Reflux    did not filll prescription   Past Surgical History:  Past Surgical History:  Procedure Laterality Date  . BREAST BIOPSY  right    cyst  . DILATION AND EVACUATION N/A 07/26/2015   Procedure: DILATATION AND EVACUATION;  Surgeon: Shelly Bombard, MD;  Location: [facility];  Service: Gynecology;  Laterality: N/A;   HPI:  Tracey [name] is a 45 y.o. female with medical history significant for hypertension, depression, anxiety, and headaches.  She presented to the emergency department for evaluation of acute onset neck discomfort, left leg numbness and weakness, and left arm numbness.  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   Assessment / Plan / Recommendation Clinical Impression  Pt was seen for a cognitive-linguistic evaluation in the setting of an acute infarct.  Pt was encountered awake/alert with husband at bedside, and she was pleasant and cooperative throughout this  evaluation.  Pt reported that she lives at home with her husband, 70 year old son, and 77 year old child.   Her 79 year old child recently moved out, but can be available to help at time of discharge.  Pt reported that she works from home full time for [employer] with Tracey [name].  She stated that she is fully independent with IADLs (including medications and finances) at baseline.  Pt completed portions of the Cognistat in addition to informal evaluation measures.  She exhibited mild deficits in the areas of attention, thought organization, and executive functioning (clock drawing task).  Husband and pt reported that attention deficits were baseline, but that thought organization and executive functioning deficits were acute.  Pt's expressive and receptive language were functional and no dysarthria was observed.  Recommend an outpatient speech therapy evaluation for further cognitive-linguistic assessment at time of discharge.  Additionally recommend that pt have supervision with IADLs, particularly medication and finances, when she is first discharged home.  Discussed results and recommendations with the pt and her husband, and they verbalized understanding.  They were both very interested in an outpatient evaluation at time of discharge.  Discussed all recommendations with RN who stated that she would relay them to the case manager.  No additional acute ST is warranted at this time, all additional needs can be met at the outpatient level following discharge.  Please re-consult if additional needs arise.    SLP Assessment  SLP Recommendation/Assessment: All further Speech Lanaguage Pathology  needs can  be addressed in the next venue of care SLP Visit Diagnosis: Attention and concentration deficit;Frontal lobe and executive function deficit;Cognitive communication deficit (R41.841) Attention and concentration deficit following: Cerebral infarction Frontal lobe and executive function deficit following:  Cerebral infarction    Follow Up Recommendations  Outpatient SLP    Frequency and Duration           SLP Evaluation Cognition  Overall Cognitive Status: Impaired/Different from baseline Arousal/Alertness: Awake/alert Orientation Level: Oriented X4 Attention: Sustained;Focused Focused Attention: Impaired Focused Attention Impairment: Verbal complex Sustained Attention: Impaired Sustained Attention Impairment: Verbal complex Memory: Appears intact Awareness: Appears intact Problem Solving: Impaired Problem Solving Impairment: Verbal complex Executive Function: Organizing Organizing: Impaired Organizing Impairment: Functional complex Safety/Judgment: Appears intact       Comprehension  Auditory Comprehension Overall Auditory Comprehension: Appears within functional limits for tasks assessed    Expression Expression Primary Mode of Expression: Verbal Verbal Expression Overall Verbal Expression: Appears within functional limits for tasks assessed Written Expression Dominant Hand: Right Written Expression: Not tested   Oral / Motor  Oral Motor/Sensory Function Overall Oral Motor/Sensory Function: Within functional limits Motor Speech Overall Motor Speech: Appears within functional limits for tasks assessed   GO                   Tracey [name] M.S., [facility] Office: [phone]  [place] 04/03/2020, 3:27 PM

## 2020-04-03 NOTE — Progress Notes (Addendum)
Pt c/o worsening pressure to left ear area related to stroke and numbness to lower leg. BP 140/83, MAP 99, P 62, R 18, T 98.3. Neuro on-call MD notified; NNO.

## 2020-04-03 NOTE — ED Notes (Signed)
[facility] called for transport. 

## 2020-04-03 NOTE — Progress Notes (Signed)
[facility] PROGRESS NOTE    Progress Note  Tracey [name]  LFY:[id number] DOB: 1975-12-02 DOA: 04/02/2020 PCP: Sigmund Hazel, MD     Brief Narrative:   Tracey [name] is an 45 y.o. female past medical history significant for essential hypertension, depression comes to the emergency room for cute onset of neck discomfort, left leg and arm numbness.  In the ED was found to be afebrile vital stable, mild hypokalemia CTA of the head and neck showed no acute findings, SARS-CoV-2 is negative  Assessment/Plan:   Left leg weakness and arm weakness: Acute onset involving the left side, CT of the head and neck showed no acute abnormalities. We will check an A1c, fasting lipid panel started on aspirin. MRI of the brain and C-spine are pending, likely a mass.  Essential hypertension: Allow permissive hypertension, continue to hold lisinopril.  Goal blood pressure less than 185/110.  Depression/anxiety: Continue Wellbutrin and Vistaril.  Hypokalemia : It is only mild, 3.30 to 1 degrees contributing to her weakness. Replete and recheck in the morning.  DVT prophylaxis: lovenxo Family Communication:none Status is: Observation  The patient remains OBS appropriate and will d/c before 2 midnights.  Dispo: The patient is from: Home              Anticipated d/c is to: Home              Anticipated d/c date is: 1 day              Patient currently is not medically stable to d/c.        Code Status:     Code Status Orders  (From admission, onward)         Start     Ordered   04/02/20 2335  Full code  Continuous        04/02/20 2337        Code Status History    Date Active Date Inactive Code Status Order ID Comments User Context   11/27/2017 2325 11/29/2017 1531 Full Code [id number]  Arabella Merles, CNM Inpatient   11/27/2017 0930 11/27/2017 2325 Full Code [id number]  Arvilla Market, DO Inpatient   11/09/2017 1413 11/11/2017 1852 Full Code [id number]  Telford Bing, MD Inpatient   11/09/2017 1235 11/09/2017 1413 Full Code [id number]  Armando Reichert, CNM Inpatient   Advance Care Planning Activity        IV Access:    Peripheral IV   Procedures and diagnostic studies:   CT Angio Head W or Wo Contrast  Result Date: 04/02/2020 CLINICAL DATA:  Left lower extremity weakness EXAM: CT ANGIOGRAPHY HEAD AND NECK TECHNIQUE: Multidetector CT imaging of the head and neck was performed using the standard protocol during bolus administration of intravenous contrast. Multiplanar CT image reconstructions and MIPs were obtained to evaluate the vascular anatomy. Carotid stenosis measurements (when applicable) are obtained utilizing NASCET criteria, using the distal internal carotid diameter as the denominator. CONTRAST:  82mL OMNIPAQUE IOHEXOL 350 MG/ML SOLN COMPARISON:  None. FINDINGS: CTA NECK FINDINGS SKELETON: There is no bony spinal canal stenosis. No lytic or blastic lesion. OTHER NECK: Normal pharynx, larynx and major salivary glands. No cervical lymphadenopathy. Unremarkable thyroid gland. UPPER CHEST: No pneumothorax or pleural effusion. No nodules or masses. AORTIC ARCH: There is no calcific atherosclerosis of the aortic arch. There is no aneurysm, dissection or hemodynamically significant stenosis of the visualized portion of the aorta. Conventional 3 vessel aortic branching pattern. The visualized proximal subclavian arteries are widely  patent. RIGHT CAROTID SYSTEM: Normal without aneurysm, dissection or stenosis. LEFT CAROTID SYSTEM: Normal without aneurysm, dissection or stenosis. VERTEBRAL ARTERIES: Left dominant configuration. Both origins are clearly patent. There is no dissection, occlusion or flow-limiting stenosis to the skull base (V1-V3 segments). CTA HEAD FINDINGS POSTERIOR CIRCULATION: --Vertebral arteries: Normal V4 segments. --Inferior cerebellar arteries: Normal. --Basilar artery: Normal. --Superior cerebellar arteries: Normal. --Posterior  cerebral arteries (PCA): Normal. ANTERIOR CIRCULATION: --Intracranial internal carotid arteries: Normal. --Anterior cerebral arteries (ACA): Normal. Both A1 segments are present. Patent anterior communicating artery (a-comm). --Middle cerebral arteries (MCA): Normal. VENOUS SINUSES: As permitted by contrast timing, patent. ANATOMIC VARIANTS: None Review of the MIP images confirms the above findings. IMPRESSION: Normal CTA of the head and neck. Electronically Signed   By: Deatra Robinson M.D.   On: 04/02/2020 22:33   CT Angio Neck W and/or Wo Contrast  Result Date: 04/02/2020 CLINICAL DATA:  Left lower extremity weakness EXAM: CT ANGIOGRAPHY HEAD AND NECK TECHNIQUE: Multidetector CT imaging of the head and neck was performed using the standard protocol during bolus administration of intravenous contrast. Multiplanar CT image reconstructions and MIPs were obtained to evaluate the vascular anatomy. Carotid stenosis measurements (when applicable) are obtained utilizing NASCET criteria, using the distal internal carotid diameter as the denominator. CONTRAST:  24mL OMNIPAQUE IOHEXOL 350 MG/ML SOLN COMPARISON:  None. FINDINGS: CTA NECK FINDINGS SKELETON: There is no bony spinal canal stenosis. No lytic or blastic lesion. OTHER NECK: Normal pharynx, larynx and major salivary glands. No cervical lymphadenopathy. Unremarkable thyroid gland. UPPER CHEST: No pneumothorax or pleural effusion. No nodules or masses. AORTIC ARCH: There is no calcific atherosclerosis of the aortic arch. There is no aneurysm, dissection or hemodynamically significant stenosis of the visualized portion of the aorta. Conventional 3 vessel aortic branching pattern. The visualized proximal subclavian arteries are widely patent. RIGHT CAROTID SYSTEM: Normal without aneurysm, dissection or stenosis. LEFT CAROTID SYSTEM: Normal without aneurysm, dissection or stenosis. VERTEBRAL ARTERIES: Left dominant configuration. Both origins are clearly patent. There  is no dissection, occlusion or flow-limiting stenosis to the skull base (V1-V3 segments). CTA HEAD FINDINGS POSTERIOR CIRCULATION: --Vertebral arteries: Normal V4 segments. --Inferior cerebellar arteries: Normal. --Basilar artery: Normal. --Superior cerebellar arteries: Normal. --Posterior cerebral arteries (PCA): Normal. ANTERIOR CIRCULATION: --Intracranial internal carotid arteries: Normal. --Anterior cerebral arteries (ACA): Normal. Both A1 segments are present. Patent anterior communicating artery (a-comm). --Middle cerebral arteries (MCA): Normal. VENOUS SINUSES: As permitted by contrast timing, patent. ANATOMIC VARIANTS: None Review of the MIP images confirms the above findings. IMPRESSION: Normal CTA of the head and neck. Electronically Signed   By: Deatra Robinson M.D.   On: 04/02/2020 22:33   CT HEAD CODE STROKE WO CONTRAST  Result Date: 04/02/2020 CLINICAL DATA:  Code stroke. Sudden onset left lower extremity weakness EXAM: CT HEAD WITHOUT CONTRAST TECHNIQUE: Contiguous axial images were obtained from the base of the skull through the vertex without intravenous contrast. COMPARISON:  None. FINDINGS: Brain: There is no mass, hemorrhage or extra-axial collection. The size and configuration of the ventricles and extra-axial CSF spaces are normal. The brain parenchyma is normal, without evidence of acute or chronic infarction. Vascular: No abnormal hyperdensity of the major intracranial arteries or dural venous sinuses. No intracranial atherosclerosis. Skull: The visualized skull base, calvarium and extracranial soft tissues are normal. Sinuses/Orbits: No fluid levels or advanced mucosal thickening of the visualized paranasal sinuses. No mastoid or middle ear effusion. The orbits are normal. ASPECTS [facility] Stroke Program Early CT Score) - Ganglionic level infarction (caudate, lentiform nuclei,  internal capsule, insula, M1-M3 cortex): 7 - Supraganglionic infarction (M4-M6 cortex): 3 Total score (0-10 with  10 being normal): 10 IMPRESSION: 1. Normal head CT. 2. ASPECTS is 10. These results were called by telephone at the time of interpretation on 04/02/2020 at 10:19 pm to provider [facility] , who verbally acknowledged these results. Electronically Signed   By: Deatra Robinson M.D.   On: 04/02/2020 22:19     Medical Consultants:    None.  Anti-Infectives:   none  Subjective:    Tracey [name] no new complaints this morning she relates her numbness has improved.  Objective:    Vitals:   04/03/20 0045 04/03/20 0115 04/03/20 0342 04/03/20 0500  BP: (!) 148/80 130/77 122/71 117/76  Pulse: 75 61 67 66  Resp: 15 19 15 18   Temp:      TempSrc:      SpO2: 99% 100% 100% 97%  Weight:      Height:       SpO2: 97 %   Intake/Output Summary (Last 24 hours) at 04/03/2020 0715 Last data filed at 04/03/2020 0150 Gross per 24 hour  Intake 616.3 ml  Output 1000 ml  Net -383.7 ml   Filed Weights   04/02/20 2106  Weight: 97.5 kg    Exam: General exam: In no acute distress. Respiratory system: Good air movement and clear to auscultation. Cardiovascular system: S1 & S2 heard, RRR. No JVD. Gastrointestinal system: Abdomen is nondistended, soft and nontender.  Central nervous system: Alert and oriented. No focal neurological deficits. Extremities: No pedal edema. Skin: No rashes, lesions or ulcers Psychiatry: Judgement and insight appear normal. Mood & affect appropriate.    Data Reviewed:    Labs: Basic Metabolic Panel: Recent Labs  Lab 04/02/20 2155 04/02/20 2250 04/03/20 0343  NA 139 139 140  K 3.3* 3.7 3.4*  CL 105 105 109  CO2 23  --  24  GLUCOSE 93 94 125*  BUN 10 10 9   CREATININE 1.05* 1.10* 1.07*  CALCIUM 9.4  --  8.5*   GFR Estimated Creatinine Clearance: 80.7 mL/min (A) (by C-G formula based on SCr of 1.07 mg/dL (H)). Liver Function Tests: Recent Labs  Lab 04/02/20 2155 04/03/20 0343  AST 17 13*  ALT 22 19  ALKPHOS 47 41  BILITOT 0.3 0.3  PROT 7.6 6.2*   ALBUMIN 4.3 3.5   No results for input(s): LIPASE, AMYLASE in the last 168 hours. No results for input(s): AMMONIA in the last 168 hours. Coagulation profile Recent Labs  Lab 04/02/20 2155  INR 0.9   COVID-19 Labs  No results for input(s): DDIMER, FERRITIN, LDH, CRP in the last 72 hours.  Lab Results  Component Value Date   SARSCOV2NAA NEGATIVE 04/02/2020    CBC: Recent Labs  Lab 04/02/20 2117 04/02/20 2250 04/03/20 0343  WBC 9.6  --  8.2  NEUTROABS 4.7  --   --   HGB 13.6 14.3 12.3  HCT 41.5 42.0 37.5  MCV 89.4  --  90.1  PLT 267  --  249   Cardiac Enzymes: No results for input(s): CKTOTAL, CKMB, CKMBINDEX, TROPONINI in the last 168 hours. BNP (last 3 results) No results for input(s): PROBNP in the last 8760 hours. CBG: Recent Labs  Lab 04/02/20 2153  GLUCAP 97   D-Dimer: No results for input(s): DDIMER in the last 72 hours. Hgb A1c: Recent Labs    04/03/20 0343  HGBA1C 5.1   Lipid Profile: Recent Labs    04/03/20 0343  CHOL 149  HDL 37*  LDLCALC 101*  TRIG 55  CHOLHDL 4.0   Thyroid function studies: No results for input(s): TSH, T4TOTAL, T3FREE, THYROIDAB in the last 72 hours.  Invalid input(s): FREET3 Anemia work up: No results for input(s): VITAMINB12, FOLATE, FERRITIN, TIBC, IRON, RETICCTPCT in the last 72 hours. Sepsis Labs: Recent Labs  Lab 04/02/20 2117 04/03/20 0343  WBC 9.6 8.2   Microbiology Recent Results (from the past 240 hour(s))  SARS CORONAVIRUS 2 (TAT 6-24 HRS) Nasopharyngeal Nasopharyngeal Swab     Status: None   Collection Time: 04/02/20 10:48 PM   Specimen: Nasopharyngeal Swab  Result Value Ref Range Status   SARS Coronavirus 2 NEGATIVE NEGATIVE Final    Comment: (NOTE) SARS-CoV-2 target nucleic acids are NOT DETECTED.  The SARS-CoV-2 RNA is generally detectable in upper and lower respiratory specimens during the acute phase of infection. Negative results do not preclude SARS-CoV-2 infection, do not rule  out co-infections with other pathogens, and should not be used as the sole basis for treatment or other patient management decisions. Negative results must be combined with clinical observations, patient history, and epidemiological information. The expected result is Negative.  Fact Sheet for Patients: HairSlick.nohttps://www.fda.gov/media/138098/download  Fact Sheet for Healthcare Providers: quierodirigir.comhttps://www.fda.gov/media/138095/download  This test is not yet approved or cleared by the [place] FDA and  has been authorized for detection and/or diagnosis of SARS-CoV-2 by FDA under an Emergency Use Authorization (EUA). This EUA will remain  in effect (meaning this test can be used) for the duration of the COVID-19 declaration under Se ction 564(b)(1) of the Act, 21 U.S.C. section 360bbb-3(b)(1), unless the authorization is terminated or revoked sooner.  Performed at [facility], [address] [place], [place] [address]      Medications:   .  stroke: mapping our early stages of recovery book   Does not apply Once  . aspirin EC  81 mg Oral Daily  . enoxaparin (LOVENOX) injection  40 mg Subcutaneous Q24H   Continuous Infusions: . sodium chloride 100 mL/hr at 04/03/20 0001      LOS: 0 days   Marinda Elkbraham Feliz Ortiz  [facility]  04/03/2020, 7:15 AM

## 2020-04-03 NOTE — Progress Notes (Signed)
PT Cancellation Note  Patient Details Name: Tracey [name] MRN: [id number] DOB: 03-19-1976   Cancelled Treatment:    Reason Eval/Treat Not Completed: Patient at procedure or test/unavailable--will check back as schedule allows.    Faye Ramsay, PT Acute Rehabilitation  Office: [phone] Pager: [phone]

## 2020-04-03 NOTE — Progress Notes (Signed)
PT Cancellation Note  Patient Details Name: Tracey [name] MRN: [id number] DOB: 05-03-1975   Cancelled Treatment:    Reason Eval/Treat Not Completed: Per chart, pt is now set to transfer to [facility]. Will check back as schedule allows.    Faye Ramsay, PT Acute Rehabilitation  Office: [phone] Pager: [phone]

## 2020-04-03 NOTE — ED Notes (Signed)
Attempted to call report to [facility] RN. RN is busy at this time and will call back shortly for report.

## 2020-04-03 NOTE — Consult Note (Addendum)
STROKE TEAM PROGRESS NOTE  HPI per record: Tracey [name] is a 45 y.o. female with medical history significant for hypertension, depression, anxiety, and headaches, now presenting to the emergency department for evaluation of acute onset neck discomfort, left leg numbness and weakness, and left arm numbness.  Patient had just started Wellbutrin today, was in her usual state of health, and then at 8:30 PM developed acute onset of numbness and weakness involving the left leg, numbness involving the left arm, and a pressure sensation at the base of her posterior neck in the midline.  There was no associated headache or change in vision, no recent fall or trauma, no fevers or chills, no facial symptoms, no speech difficulty.  She has never experienced similar symptoms before. ED Course: Upon arrival to the ED, patient is found to be afebrile, saturating well on room air, and with stable blood pressure.  EKG features sinus rhythm.  Chemistry panel notable for mild hypokalemia.  CBC unremarkable.  CT head and CTA head and neck were normal studies.  COVID-19 screening test pending.  Teleneurology evaluated the patient and made recommendations including admission, MRI brain with and without contrast, and echocardiogram.  Patient was started on IV fluids.  She passed swallow screen in the ED.  INTERVAL HISTORY No acute overnight events.  Patient evaluated at bedside this afternoon, with husband present in the room.  She states yesterday around 8:30 AM she started feeling numbness in her left leg, and then in left arm as well.  Although no speech difficulty or any other complaint.  She states she feels almost close to baseline now except some numbness in left leg.  She is informed about her stroke and explained possible reasons at her young age.  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  Denies OCP use, denies recent travel.  She states she has hypertension,  anxiety and depression and is compliant with medication.  Patient answered all questions appropriately and understands more testing need to be done to find out reason for her stroke.  We explained we need to do more laboratory testing like a ANA, APA, sickle cell disease screen, ESR.  Also need to do TEE, but due to unfavorable weather tomorrow it might not be possible until Tuesday.  Patient states she would not like to stay in hospital and would come back for TEE and TCD bubble study.  Also, would like to do sleep study and plans to follow with Dr. Leonie Man in clinic.  She is explained about taking aspirin and Plavix for 3 weeks and then continuing aspirin alone and shows good understanding about it.  Blood pressure well controlled.  Neurological exam improving, please see below for details.  Dr. Tammi Klippel called and updated about the patient's current situation from stroke team point of view and plan and patient willingness to go home today.   Vitals:   04/03/20 0824 04/03/20 1204 04/03/20 1446 04/03/20 1517  BP: (!) 153/84 138/80 (!) 128/91 (!) 133/58  Pulse: 60 77 91 (!) 58  Resp: 15 (!) 24 18   Temp:   97.6 F (36.4 C)   TempSrc:   Oral   SpO2: 99% 100% 100%   Weight:      Height:       CBC:  Recent Labs  Lab 04/02/20 2117 04/02/20 2250 04/03/20 0343  WBC 9.6  --  8.2  NEUTROABS 4.7  --   --  HGB 13.6 14.3 12.3  HCT 41.5 42.0 37.5  MCV 89.4  --  90.1  PLT 267  --  258   Basic Metabolic Panel:  Recent Labs  Lab 04/02/20 2155 04/02/20 2250 04/03/20 0343  NA 139 139 140  K 3.3* 3.7 3.4*  CL 105 105 109  CO2 23  --  24  GLUCOSE 93 94 125*  BUN $Re'10 10 9  'JZA$ CREATININE 1.05* 1.10* 1.07*  CALCIUM 9.4  --  8.5*   Lipid Panel:  Recent Labs  Lab 04/03/20 0343  CHOL 149  TRIG 55  HDL 37*  CHOLHDL 4.0  VLDL 11  LDLCALC 101*   HgbA1c:  Recent Labs  Lab 04/03/20 0343  HGBA1C 5.1   Urine Drug Screen:  Recent Labs  Lab 04/02/20 2155  LABOPIA NONE DETECTED  COCAINSCRNUR  NONE DETECTED  LABBENZ NONE DETECTED  AMPHETMU NONE DETECTED  THCU NONE DETECTED  LABBARB NONE DETECTED    Alcohol Level  Recent Labs  Lab 04/02/20 2155  ETH <10    IMAGING past 24 hours  CT head code stroke 04/02/2020  IMPRESSION: 1. Normal head CT. 2. ASPECTS is 10.  CTA Head and Neck 04/02/2020  IMPRESSION: Normal CTA of the head and neck.  MR Brain w wo contrast 04/04/2019  IMPRESSION: 1. 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 2. Additional mild scattered T2/FLAIR hyperintensities within the white matter, mildly advanced for age. These are nonspecific and could relate to chronic microvascular ischemic disease, prior migraines, trauma, demyelination, or inflammation.  Echocardiogram 04/04/2019  IMPRESSIONS     1. Left ventricular ejection fraction, by estimation, is 55 to 60%. The  left ventricle has normal function. The left ventricle has no regional  wall motion abnormalities. Left ventricular diastolic parameters were  normal.   2. Right ventricular systolic function is normal. The right ventricular  size is normal.   3. Left atrial size was mildly dilated.   4. The mitral valve is normal in structure. Trivial mitral valve  regurgitation. No evidence of mitral stenosis.   5. The aortic valve is normal in structure. Aortic valve regurgitation is  not visualized. No aortic stenosis is present.   6. The inferior vena cava is normal in size with greater than 50%  respiratory variability, suggesting right atrial pressure of 3 mmHg.    PHYSICAL EXAM General: Well developed, well nourished, obese African-American female sitting in bed comfortably.  NAD HEENT: Tracey [name], moist mucous membrane, PERRLA Cardiovascular: Regular rate and rhythm, S1-S2 heard, no murmurs rubs or gallops Respiratory: Clear to auscultation bilaterally, no respiratory distress Extremities: Warm, no peripheral  edema Psychiatric: Normal mood and affect, normal thought content, normal judgment Neurological:   Mental Status: Alert, oriented, thought content appropriate.  Speech fluent without evidence of aphasia.  Able to follow 3 step commands without difficulty. Cranial Nerves: II:  Visual fields grossly normal, pupils equal, round, reactive to light and accommodation III,IV, VI: ptosis not present, extra-ocular motions intact bilaterally V,VII: smile symmetric, facial light touch sensation normal bilaterally VIII: hearing normal bilaterally IX,X: uvula rises symmetrically XI: bilateral shoulder shrug XII: midline tongue extension without atrophy or fasciculations  Motor: Right : Upper extremity   5/5    Left:     Upper extremity   5/5  Lower extremity   5/5, except 4+/5 distally    Lower extremity   5/5 Tone and bulk:normal tone throughout; no atrophy noted Sensory: Pinprick and light touch  decreased on left leg and intact throughout, bilaterally.  Vibration and position sense are preserved bilaterally. Vibration: Intact throughout bilaterally. Deep Tendon Reflexes:  Right: Upper Extremity   Left: Upper extremity   biceps (C-5 to C-6) 2/4   biceps (C-5 to C-6) 2/4 tricep (C7) 2/4    triceps (C7) 2/4 Brachioradialis (C6) 2/4  Brachioradialis (C6) 2/4  Lower Extremity Lower Extremity  quadriceps (L-2 to L-4) 2/4   quadriceps (L-2 to L-4) 2/4 Achilles (S1) 2/4   Achilles (S1) 2/4  Plantars: Right: downgoing   Left: downgoing Cerebellar: normal finger-to-nose,  normal heel-to-shin test Gait: Deferred   ASSESSMENT/PLAN [name] is a 45 y.o. female with history of hypertension, depression, anxiety, headaches presented with acute onset left leg weakness and numbness, left arm numbness and left-sided neck discomfort.  tPA was denied by patient.  MRI brain shows Small acute infarct in the juxtacortical high right posterior frontal lobe. Minimal edema without mass effect.   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  To rule out emboli, planning to do TEE, TCD bubble study.  Also, due to young age and 3 miscarriages in the past, getting lab studies like a PA, ANA panel, ESR, sickle cell screen.  Code Stroke CT head No acute abnormality.  ASPECTS 10.  CTA head & neck: Normal MRI : Small acute infarct~2cm in the juxtacortical high right posterior frontal lobe.Minimal edema without mass effect. 2D Echo: Left ventricular ejection fraction, by estimation, is 55 to 60%. The  left ventricle has normal function TEE: recommended, possibly next week TCD bubble study: recommended, possibly next week Sleep study: recommended as outpatient LDL 101 HgbA1c 5.1 VTE prophylaxis -Lovenox 40 mg RPR, ANA panel, APA, ESR: Pending    Diet   Diet regular Room service appropriate? Yes; Fluid consistency: Thin   No antithrombotic prior to admission, now on aspirin 81 mg daily and clopidogrel 75 mg daily.  Continue DAPT * 3 weeks then continue aspirin 81 mg alone Therapy recommendations: Pending Disposition: Home  Hypertension Home meds: Lisinopril Stable Permissive hypertension (OK if < 220/120) but gradually normalize in 5-7 days Long-term BP goal normotensive  Hyperlipidemia Home meds: None LDL 101, goal < 70 Start Crestor 10 mg Continue statin at discharge   Other Stroke Risk Factors  Cigarette smoker: Patient counseled about cigarette cessation and educated how this is one of the risk factor for stroke. Obesity, Body mass index is 34.7 kg/m., BMI >/= 30 associated with increased stroke risk, recommend weight loss, diet and exercise as appropriate  Obstructive sleep apnea?  Recommended sleep study  Other Active Problems Anxiety: Hydroxyzine Depression: Wellbutrin  Hospital day # 0 She presented with sudden onset of left leg weakness and paresthesias due to a large  right frontal subcortical infarct possibly embolic and needs further work-up.  Check TCD bubble study for PFO, TEE for cardiac source of embolism, lab work for vasculitis, sickle cell, anticardiolipin antibodies and syphilis.  Aspirin Plavix for 3 weeks followed by aspirin alone.  Aggressive risk factor modification.  May consider outpatient sleep study for sleep apnea as well.  Long discussion with the patient and husband at bedside and answered questions.  Greater than 50% time during this 35-minute visit spent on the coordination of care about this answering questions Antony Contras, MD  To contact Stroke Continuity provider, please refer to [email or web address] After hours, contact General Neurology

## 2020-04-03 NOTE — ED Notes (Signed)
ED TO INPATIENT HANDOFF REPORT  Name/Age/Gender Tracey [name] 45 y.o. female  Code Status    Code Status Orders  (From admission, onward)         Start     Ordered   04/02/20 2335  Full code  Continuous        04/02/20 2337        Code Status History    Date Active Date Inactive Code Status Order ID Comments User Context   11/27/2017 2325 11/29/2017 1531 Full Code [id number]  Arabella MerlesShaw, Kimberly D, CNM Inpatient   11/27/2017 0930 11/27/2017 2325 Full Code [id number]  Arvilla MarketWallace, Catherine Lauren, DO Inpatient   11/09/2017 1413 11/11/2017 1852 Full Code [id number]   BingPickens, Charlie, MD Inpatient   11/09/2017 1235 11/09/2017 1413 Full Code [id number]  Armando ReichertHogan, Heather D, CNM Inpatient   Advance Care Planning Activity      Home/SNF/Other Home  Chief Complaint Left leg weakness [R29.898]  Level of Care/Admitting Diagnosis ED Disposition    ED Disposition Condition Comment   Admit  [facility] [[id number]  Level of Care: Telemetry Medical [104]  I expect the patient will be discharged within 24 hours: Yes  LOW acuity---Tx typically complete <24 hrs---ACUTE conditions typically can be evaluated <24 hours---LABS likely to return to acceptable levels <24 hours---IS near functional baseline---EXPECTED to return to current living arrangement---NOT newly hypoxic: Does not meet criteria for 5C-Observation unit  Covid Evaluation: Asymptomatic Screening Protocol (No Symptoms)  Diagnosis: Left leg weakness [[id number]][[id number]]  Admitting Physician: Briscoe DeutscherPYD, TIMOTHY S [[id number]]  Attending Physician: Briscoe DeutscherPYD, TIMOTHY S [[id number]]       Medical History Past Medical History:  Diagnosis Date  . Anxiety   . Bronchitis   . Carpal tunnel syndrome on both sides   . Depression   . Family history of adverse reaction to anesthesia    mother had problems waking up from surgery.  . Gestational diabetes mellitus    Normal 2 hr GTT postpartum  . Headache    migraines  . Hypertension   .  Oligohydramnios antepartum 11/09/2017  . Panic attack 2017   diagnosed 3 months ago. no meds presently  . Reflux    did not filll prescription    Allergies No Known Allergies  IV Location/Drains/Wounds Patient Lines/Drains/Airways Status    Active Line/Drains/Airways    Name Placement date Placement time Site Days   Peripheral IV 11/27/17 Right Wrist 11/27/17  0700  Wrist  858   Peripheral IV 04/02/20 Right Antecubital 04/02/20  2155  Antecubital  1   Epidural Catheter 11/27/17 11/27/17  1803  - 858   Incision (Closed) 07/26/15 Vagina Other (Comment) 07/26/15  0708  - 1713          Labs/Imaging Results for orders placed or performed during the hospital encounter of 04/02/20 (from the past 48 hour(s))  CBC     Status: None   Collection Time: 04/02/20  9:17 PM  Result Value Ref Range   WBC 9.6 4.0 - 10.5 K/uL   RBC 4.64 3.87 - 5.11 MIL/uL   Hemoglobin 13.6 12.0 - 15.0 g/dL   HCT 66.441.5 40.336.0 - 47.446.0 %   MCV 89.4 80.0 - 100.0 fL   MCH 29.3 26.0 - 34.0 pg   MCHC 32.8 30.0 - 36.0 g/dL   RDW 25.913.7 56.311.5 - 87.515.5 %   Platelets 267 150 - 400 K/uL   nRBC 0.0 0.0 - 0.2 %    Comment: Performed at [facility], [address] [place], [place] [address]  Urinalysis, Routine w reflex microscopic Urine, Clean Catch     Status: Abnormal   Collection Time: 04/02/20  9:17 PM  Result Value Ref Range   Color, Urine STRAW (A) YELLOW   APPearance CLEAR CLEAR   Specific Gravity, Urine 1.004 (L) 1.005 - 1.030   pH 7.0 5.0 - 8.0   Glucose, UA NEGATIVE NEGATIVE mg/dL   Hgb urine dipstick NEGATIVE NEGATIVE   Bilirubin Urine NEGATIVE NEGATIVE   Ketones, ur NEGATIVE NEGATIVE mg/dL   Protein, ur NEGATIVE NEGATIVE mg/dL   Nitrite NEGATIVE NEGATIVE   Leukocytes,Ua SMALL (A) NEGATIVE   RBC / HPF 0-5 0 - 5 RBC/hpf   WBC, UA 0-5 0 - 5 WBC/hpf   Bacteria, UA RARE (A) NONE SEEN   Squamous Epithelial / LPF 0-5 0 - 5    Comment: Performed at [facility], [address], [place], [place] [address]  Differential     Status: None   Collection Time: 04/02/20  9:17 PM  Result Value Ref Range   Neutrophils Relative % 48 %   Neutro Abs 4.7 1.7 - 7.7 K/uL   Lymphocytes Relative 40 %   Lymphs Abs 3.9 0.7 - 4.0 K/uL   Monocytes Relative 9 %   Monocytes Absolute 0.9 0.1 - 1.0 K/uL   Eosinophils Relative 2 %   Eosinophils Absolute 0.2 0.0 - 0.5 K/uL   Basophils Relative 1 %   Basophils Absolute 0.1 0.0 - 0.1 K/uL   Immature Granulocytes 0 %   Abs Immature Granulocytes 0.03 0.00 - 0.07 K/uL    Comment: Performed at [facility], [address], [place], [place] [address]  CBG monitoring, ED     Status: None   Collection Time: 04/02/20  9:53 PM  Result Value Ref Range   Glucose-Capillary 97 70 - 99 mg/dL    Comment: Glucose reference range applies only to samples taken after fasting for at least 8 hours.  Ethanol     Status: None   Collection Time: 04/02/20  9:55 PM  Result Value Ref Range   Alcohol, Ethyl (B) <10 <10 mg/dL    Comment: (NOTE) Lowest detectable limit for serum alcohol is 10 mg/dL.  For medical purposes only. Performed at [facility], [address], [place], [place] [address]   Protime-INR     Status: None   Collection Time: 04/02/20  9:55 PM  Result Value Ref Range   Prothrombin Time 12.2 11.4 - 15.2 seconds   INR 0.9 0.8 - 1.2    Comment: (NOTE) INR goal varies based on device and disease states. Performed at [facility], [address] [place], [place] [address]   APTT     Status: None   Collection Time: 04/02/20  9:55 PM  Result Value Ref Range   aPTT 36 24 - 36 seconds    Comment: Performed at [facility], [address], [place], [place] [address]  Comprehensive metabolic panel     Status: Abnormal   Collection Time: 04/02/20  9:55 PM  Result Value Ref Range   Sodium 139 135 - 145 mmol/L   Potassium 3.3 (L) 3.5 - 5.1 mmol/L   Chloride  105 98 - 111 mmol/L   CO2 23 22 - 32 mmol/L   Glucose, Bld 93 70 - 99 mg/dL    Comment: Glucose reference range applies only to samples taken after fasting for at least 8  hours.   BUN 10 6 - 20 mg/dL   Creatinine, Ser 1.44 (H) 0.44 - 1.00 mg/dL   Calcium 9.4 8.9 - 31.5 mg/dL   Total Protein 7.6 6.5 - 8.1 g/dL   Albumin 4.3 3.5 - 5.0 g/dL   AST 17 15 - 41 U/L   ALT 22 0 - 44 U/L   Alkaline Phosphatase 47 38 - 126 U/L   Total Bilirubin 0.3 0.3 - 1.2 mg/dL   GFR, Estimated >40 >08 mL/min    Comment: (NOTE) Calculated using the CKD-EPI Creatinine Equation (2021)    Anion gap 11 5 - 15    Comment: Performed at [facility], [address], [place], [place] [address]  Urine rapid drug screen (hosp performed)not at [facility]     Status: None   Collection Time: 04/02/20  9:55 PM  Result Value Ref Range   Opiates NONE DETECTED NONE DETECTED   Cocaine NONE DETECTED NONE DETECTED   Benzodiazepines NONE DETECTED NONE DETECTED   Amphetamines NONE DETECTED NONE DETECTED   Tetrahydrocannabinol NONE DETECTED NONE DETECTED   Barbiturates NONE DETECTED NONE DETECTED    Comment: (NOTE) DRUG SCREEN FOR MEDICAL PURPOSES ONLY.  IF CONFIRMATION IS NEEDED FOR ANY PURPOSE, NOTIFY LAB WITHIN 5 DAYS.  LOWEST DETECTABLE LIMITS FOR URINE DRUG SCREEN Drug Class                     Cutoff (ng/mL) Amphetamine and metabolites    1000 Barbiturate and metabolites    200 Benzodiazepine                 200 Tricyclics and metabolites     300 Opiates and metabolites        300 Cocaine and metabolites        300 THC                            50 Performed at [facility], [address] [place], [place] [address]   I-Stat beta hCG blood, ED     Status: None   Collection Time: 04/02/20 10:47 PM  Result Value Ref Range   I-stat hCG, quantitative <5.0 <5 mIU/mL   Comment 3            Comment:   GEST. AGE      CONC.  (mIU/mL)   <=1 WEEK        5 - 50     2 WEEKS       50 -  500     3 WEEKS       100 - 10,000     4 WEEKS     1,000 - 30,000        FEMALE AND NON-PREGNANT FEMALE:     LESS THAN 5 mIU/mL   SARS CORONAVIRUS 2 (TAT 6-24 HRS) Nasopharyngeal Nasopharyngeal Swab     Status: None   Collection Time: 04/02/20 10:48 PM   Specimen: Nasopharyngeal Swab  Result Value Ref Range   SARS Coronavirus 2 NEGATIVE NEGATIVE    Comment: (NOTE) SARS-CoV-2 target nucleic acids are NOT DETECTED.  The SARS-CoV-2 RNA is generally detectable in upper and lower respiratory specimens during the acute phase of infection. Negative results do not preclude SARS-CoV-2 infection, do not rule out co-infections with other pathogens, and should not be used as the sole basis for treatment or other patient management decisions. Negative results must be combined with  clinical observations, patient history, and epidemiological information. The expected result is Negative.  Fact Sheet for Patients: HairSlick.no  Fact Sheet for Healthcare Providers: quierodirigir.com  This test is not yet approved or cleared by the [place] FDA and  has been authorized for detection and/or diagnosis of SARS-CoV-2 by FDA under an Emergency Use Authorization (EUA). This EUA will remain  in effect (meaning this test can be used) for the duration of the COVID-19 declaration under Se ction 564(b)(1) of the Act, 21 U.S.C. section 360bbb-3(b)(1), unless the authorization is terminated or revoked sooner.  Performed at [facility], [address], [place], [place] [address]   I-stat chem 8, ED     Status: Abnormal   Collection Time: 04/02/20 10:50 PM  Result Value Ref Range   Sodium 139 135 - 145 mmol/L   Potassium 3.7 3.5 - 5.1 mmol/L   Chloride 105 98 - 111 mmol/L   BUN 10 6 - 20 mg/dL   Creatinine, Ser 5.28 (H) 0.44 - 1.00 mg/dL   Glucose, Bld 94 70 - 99 mg/dL    Comment: Glucose reference range applies only to samples taken  after fasting for at least 8 hours.   Calcium, Ion 1.16 1.15 - 1.40 mmol/L   TCO2 26 22 - 32 mmol/L   Hemoglobin 14.3 12.0 - 15.0 g/dL   HCT 41.3 24.4 - 01.0 %  Hemoglobin A1c     Status: None   Collection Time: 04/03/20  3:43 AM  Result Value Ref Range   Hgb A1c MFr Bld 5.1 4.8 - 5.6 %    Comment: (NOTE) Pre diabetes:          5.7%-6.4%  Diabetes:              >6.4%  Glycemic control for   <7.0% adults with diabetes    Mean Plasma Glucose 99.67 mg/dL    Comment: Performed at [facility], [address], [place], [place] [address]  Lipid panel     Status: Abnormal   Collection Time: 04/03/20  3:43 AM  Result Value Ref Range   Cholesterol 149 0 - 200 mg/dL   Triglycerides 55 <664 mg/dL   HDL 37 (L) >40 mg/dL   Total CHOL/HDL Ratio 4.0 RATIO   VLDL 11 0 - 40 mg/dL   LDL Cholesterol 347 (H) 0 - 99 mg/dL    Comment:        Total Cholesterol/HDL:CHD Risk Coronary Heart Disease Risk Table                     Men   Women  1/2 Average Risk   3.4   3.3  Average Risk       5.0   4.4  2 X Average Risk   9.6   7.1  3 X Average Risk  23.4   11.0        Use the calculated Patient Ratio above and the CHD Risk Table to determine the patient's CHD Risk.        ATP III CLASSIFICATION (LDL):  <100     mg/dL   Optimal  425-956  mg/dL   Near or Above                    Optimal  130-159  mg/dL   Borderline  387-564  mg/dL   High  >332     mg/dL   Very High Performed at [facility],  [address] [place], [place] [address]   CBC     Status: None   Collection Time: 04/03/20  3:43 AM  Result Value Ref Range   WBC 8.2 4.0 - 10.5 K/uL   RBC 4.16 3.87 - 5.11 MIL/uL   Hemoglobin 12.3 12.0 - 15.0 g/dL   HCT 60.4 54.0 - 98.1 %   MCV 90.1 80.0 - 100.0 fL   MCH 29.6 26.0 - 34.0 pg   MCHC 32.8 30.0 - 36.0 g/dL   RDW 19.1 47.8 - 29.5 %   Platelets 249 150 - 400 K/uL   nRBC 0.0 0.0 - 0.2 %    Comment: Performed at [facility], [address], [place], [place] [address]  Comprehensive metabolic panel     Status: Abnormal   Collection Time: 04/03/20  3:43 AM  Result Value Ref Range   Sodium 140 135 - 145 mmol/L   Potassium 3.4 (L) 3.5 - 5.1 mmol/L   Chloride 109 98 - 111 mmol/L   CO2 24 22 - 32 mmol/L   Glucose, Bld 125 (H) 70 - 99 mg/dL    Comment: Glucose reference range applies only to samples taken after fasting for at least 8 hours.   BUN 9 6 - 20 mg/dL   Creatinine, Ser 8.65 (H) 0.44 - 1.00 mg/dL   Calcium 8.5 (L) 8.9 - 10.3 mg/dL   Total Protein 6.2 (L) 6.5 - 8.1 g/dL   Albumin 3.5 3.5 - 5.0 g/dL   AST 13 (L) 15 - 41 U/L   ALT 19 0 - 44 U/L   Alkaline Phosphatase 41 38 - 126 U/L   Total Bilirubin 0.3 0.3 - 1.2 mg/dL   GFR, Estimated >78 >46 mL/min    Comment: (NOTE) Calculated using the CKD-EPI Creatinine Equation (2021)    Anion gap 7 5 - 15    Comment: Performed at [facility], [address] [place], [place] [address]   CT Angio Head W or Wo Contrast  Result Date: 04/02/2020 CLINICAL DATA:  Left lower extremity weakness EXAM: CT ANGIOGRAPHY HEAD AND NECK TECHNIQUE: Multidetector CT imaging of the head and neck was performed using the standard protocol during bolus administration of intravenous contrast. Multiplanar CT image reconstructions and MIPs were obtained to evaluate the vascular anatomy. Carotid stenosis measurements (when applicable) are obtained utilizing NASCET criteria, using the distal internal carotid diameter as the denominator. CONTRAST:  60mL OMNIPAQUE IOHEXOL 350 MG/ML SOLN COMPARISON:  None. FINDINGS: CTA NECK FINDINGS SKELETON: There is no bony spinal canal stenosis. No lytic or blastic lesion. OTHER NECK: Normal pharynx, larynx and major salivary glands. No cervical lymphadenopathy. Unremarkable thyroid gland. UPPER CHEST: No pneumothorax or pleural effusion. No nodules or masses. AORTIC ARCH: There is no calcific atherosclerosis of the aortic arch. There is no  aneurysm, dissection or hemodynamically significant stenosis of the visualized portion of the aorta. Conventional 3 vessel aortic branching pattern. The visualized proximal subclavian arteries are widely patent. RIGHT CAROTID SYSTEM: Normal without aneurysm, dissection or stenosis. LEFT CAROTID SYSTEM: Normal without aneurysm, dissection or stenosis. VERTEBRAL ARTERIES: Left dominant configuration. Both origins are clearly patent. There is no dissection, occlusion or flow-limiting stenosis to the skull base (V1-V3 segments). CTA HEAD FINDINGS POSTERIOR CIRCULATION: --Vertebral arteries: Normal V4 segments. --Inferior cerebellar arteries: Normal. --Basilar artery: Normal. --Superior cerebellar arteries: Normal. --Posterior cerebral arteries (PCA): Normal. ANTERIOR CIRCULATION: --Intracranial internal carotid arteries: Normal. --Anterior cerebral arteries (ACA): Normal. Both A1 segments are present. Patent anterior communicating  artery (a-comm). --Middle cerebral arteries (MCA): Normal. VENOUS SINUSES: As permitted by contrast timing, patent. ANATOMIC VARIANTS: None Review of the MIP images confirms the above findings. IMPRESSION: Normal CTA of the head and neck. Electronically Signed   By: Deatra Robinson M.D.   On: 04/02/2020 22:33   CT Angio Neck W and/or Wo Contrast  Result Date: 04/02/2020 CLINICAL DATA:  Left lower extremity weakness EXAM: CT ANGIOGRAPHY HEAD AND NECK TECHNIQUE: Multidetector CT imaging of the head and neck was performed using the standard protocol during bolus administration of intravenous contrast. Multiplanar CT image reconstructions and MIPs were obtained to evaluate the vascular anatomy. Carotid stenosis measurements (when applicable) are obtained utilizing NASCET criteria, using the distal internal carotid diameter as the denominator. CONTRAST:  60mL OMNIPAQUE IOHEXOL 350 MG/ML SOLN COMPARISON:  None. FINDINGS: CTA NECK FINDINGS SKELETON: There is no bony spinal canal stenosis. No lytic  or blastic lesion. OTHER NECK: Normal pharynx, larynx and major salivary glands. No cervical lymphadenopathy. Unremarkable thyroid gland. UPPER CHEST: No pneumothorax or pleural effusion. No nodules or masses. AORTIC ARCH: There is no calcific atherosclerosis of the aortic arch. There is no aneurysm, dissection or hemodynamically significant stenosis of the visualized portion of the aorta. Conventional 3 vessel aortic branching pattern. The visualized proximal subclavian arteries are widely patent. RIGHT CAROTID SYSTEM: Normal without aneurysm, dissection or stenosis. LEFT CAROTID SYSTEM: Normal without aneurysm, dissection or stenosis. VERTEBRAL ARTERIES: Left dominant configuration. Both origins are clearly patent. There is no dissection, occlusion or flow-limiting stenosis to the skull base (V1-V3 segments). CTA HEAD FINDINGS POSTERIOR CIRCULATION: --Vertebral arteries: Normal V4 segments. --Inferior cerebellar arteries: Normal. --Basilar artery: Normal. --Superior cerebellar arteries: Normal. --Posterior cerebral arteries (PCA): Normal. ANTERIOR CIRCULATION: --Intracranial internal carotid arteries: Normal. --Anterior cerebral arteries (ACA): Normal. Both A1 segments are present. Patent anterior communicating artery (a-comm). --Middle cerebral arteries (MCA): Normal. VENOUS SINUSES: As permitted by contrast timing, patent. ANATOMIC VARIANTS: None Review of the MIP images confirms the above findings. IMPRESSION: Normal CTA of the head and neck. Electronically Signed   By: Deatra Robinson M.D.   On: 04/02/2020 22:33   MR BRAIN W WO CONTRAST  Result Date: 04/03/2020 CLINICAL DATA:  Neuro deficit, acute stroke suspected. EXAM: MRI HEAD WITHOUT AND WITH CONTRAST TECHNIQUE: Multiplanar, multiecho pulse sequences of the brain and surrounding structures were obtained without and with intravenous contrast. CONTRAST:  10mL GADAVIST GADOBUTROL 1 MMOL/ML IV SOLN COMPARISON:  CT head April 02, 2020 FINDINGS: Brain: Small  acute infarct in the juxtacortical high right posterior frontal lobe (series 9, image 92). Minimal edema without mass effect. No acute hemorrhage. Additional scattered mild T2/FLAIR hyperintensities within the white matter, mildly advanced for age. No hydrocephalus. No mass lesion. No abnormal enhancement. No abnormal mass effect or extra-axial fluid collection. Vascular: Major arterial flow voids are maintained at the skull base. Skull and upper cervical spine: No focal marrow signal abnormality. Sinuses/Orbits: Visualized sinuses are largely clear. Unremarkable orbits. Other: No mastoid effusions. IMPRESSION: 1. 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 2. Additional mild scattered T2/FLAIR hyperintensities within the white matter, mildly advanced for age. These are nonspecific and could relate to chronic microvascular ischemic disease, prior migraines, trauma, demyelination, or inflammation. Electronically Signed   By: Feliberto Harts MD   On: 04/03/2020 11:33   ECHOCARDIOGRAM COMPLETE  Result Date: 04/03/2020    ECHOCARDIOGRAM REPORT   Patient Name:   Tracey [name] Date of Exam: 04/03/2020 Medical Rec #:  [id number]    Height:       66.0 in Accession #:    [id number]   Weight:       215.0 lb Date of Birth:  1976-01-30   BSA:          2.062 m Patient Age:    44 years     BP:           138/81 mmHg Patient Gender: F            HR:           71 bpm. Exam Location:  Inpatient Procedure: 2D Echo, Color Doppler and Cardiac Doppler Indications:    Stroke i163.9  History:        Patient has no prior history of Echocardiogram examinations.                 Risk Factors:Hypertension.  Sonographer:    Irving BurtonEmily Senior RDCS Referring Phys: [id number] TIMOTHY S OPYD IMPRESSIONS  1. Left ventricular ejection fraction, by estimation, is 55 to 60%. The left ventricle has normal function. The left ventricle has no regional wall motion abnormalities. Left ventricular  diastolic parameters were normal.  2. Right ventricular systolic function is normal. The right ventricular size is normal.  3. Left atrial size was mildly dilated.  4. The mitral valve is normal in structure. Trivial mitral valve regurgitation. No evidence of mitral stenosis.  5. The aortic valve is normal in structure. Aortic valve regurgitation is not visualized. No aortic stenosis is present.  6. The inferior vena cava is normal in size with greater than 50% respiratory variability, suggesting right atrial pressure of 3 mmHg. FINDINGS  Left Ventricle: Left ventricular ejection fraction, by estimation, is 55 to 60%. The left ventricle has normal function. The left ventricle has no regional wall motion abnormalities. The left ventricular internal cavity size was normal in size. There is  no left ventricular hypertrophy. Left ventricular diastolic parameters were normal. Right Ventricle: The right ventricular size is normal. No increase in right ventricular wall thickness. Right ventricular systolic function is normal. Left Atrium: Left atrial size was mildly dilated. Right Atrium: Right atrial size was normal in size. Pericardium: There is no evidence of pericardial effusion. Mitral Valve: The mitral valve is normal in structure. Trivial mitral valve regurgitation. No evidence of mitral valve stenosis. Tricuspid Valve: The tricuspid valve is normal in structure. Tricuspid valve regurgitation is trivial. No evidence of tricuspid stenosis. Aortic Valve: The aortic valve is normal in structure. Aortic valve regurgitation is not visualized. No aortic stenosis is present. Pulmonic Valve: The pulmonic valve was normal in structure. Pulmonic valve regurgitation is not visualized. No evidence of pulmonic stenosis. Aorta: The aortic root is normal in size and structure. Venous: The inferior vena cava is normal in size with greater than 50% respiratory variability, suggesting right atrial pressure of 3 mmHg. IAS/Shunts: No  atrial level shunt detected by color flow Doppler.  LEFT VENTRICLE PLAX 2D LVIDd:         4.60 cm  Diastology LVIDs:         3.40 cm  LV e' medial:    11.70 cm/s LV PW:         1.00 cm  LV E/e' medial:  8.1 LV IVS:        1.00 cm  LV e' lateral:   13.80 cm/s LVOT diam:     2.00 cm  LV E/e' lateral: 6.9 LV SV:  74 LV SV Index:   36 LVOT Area:     3.14 cm  LEFT ATRIUM             Index       RIGHT ATRIUM           Index LA diam:        4.00 cm 1.94 cm/m  RA Area:     14.10 cm LA Vol (A2C):   58.2 ml 28.22 ml/m RA Volume:   34.50 ml  16.73 ml/m LA Vol (A4C):   40.0 ml 19.40 ml/m LA Biplane Vol: 48.3 ml 23.42 ml/m  AORTIC VALVE LVOT Vmax:   117.00 cm/s LVOT Vmean:  73.700 cm/s LVOT VTI:    0.235 m  AORTA Ao Root diam: 2.80 cm Ao Asc diam:  3.10 cm MITRAL VALVE MV Area (PHT): 3.77 cm    SHUNTS MV Decel Time: 201 msec    Systemic VTI:  0.24 m MV E velocity: 94.70 cm/s  Systemic Diam: 2.00 cm MV A velocity: 45.40 cm/s MV E/A ratio:  2.09 Charlton Haws MD Electronically signed by Charlton Haws MD Signature Date/Time: 04/03/2020/9:29:05 AM    Final    CT HEAD CODE STROKE WO CONTRAST  Result Date: 04/02/2020 CLINICAL DATA:  Code stroke. Sudden onset left lower extremity weakness EXAM: CT HEAD WITHOUT CONTRAST TECHNIQUE: Contiguous axial images were obtained from the base of the skull through the vertex without intravenous contrast. COMPARISON:  None. FINDINGS: Brain: There is no mass, hemorrhage or extra-axial collection. The size and configuration of the ventricles and extra-axial CSF spaces are normal. The brain parenchyma is normal, without evidence of acute or chronic infarction. Vascular: No abnormal hyperdensity of the major intracranial arteries or dural venous sinuses. No intracranial atherosclerosis. Skull: The visualized skull base, calvarium and extracranial soft tissues are normal. Sinuses/Orbits: No fluid levels or advanced mucosal thickening of the visualized paranasal sinuses. No mastoid or  middle ear effusion. The orbits are normal. ASPECTS [facility] Stroke Program Early CT Score) - Ganglionic level infarction (caudate, lentiform nuclei, internal capsule, insula, M1-M3 cortex): 7 - Supraganglionic infarction (M4-M6 cortex): 3 Total score (0-10 with 10 being normal): 10 IMPRESSION: 1. Normal head CT. 2. ASPECTS is 10. These results were called by telephone at the time of interpretation on 04/02/2020 at 10:19 pm to provider Community [facility] , who verbally acknowledged these results. Electronically Signed   By: Deatra Robinson M.D.   On: 04/02/2020 22:19    Pending Labs Unresulted Labs (From admission, onward)          Start     Ordered   04/09/20 0500  Creatinine, serum  (enoxaparin (LOVENOX)    CrCl >/= 30 ml/min)  Weekly,   R     Comments: while on enoxaparin therapy    04/02/20 2337   04/03/20 0500  HIV Antibody (routine testing w rflx)  (HIV Antibody (Routine testing w reflex) panel)  Tomorrow morning,   R        04/02/20 2337          Vitals/Pain Today's Vitals   04/03/20 0342 04/03/20 0500 04/03/20 0824 04/03/20 1204  BP: 122/71 117/76 (!) 153/84 138/80  Pulse: 67 66 60 77  Resp: 15 18 15  (!) 24  Temp:      TempSrc:      SpO2: 100% 97% 99% 100%  Weight:      Height:      PainSc:        Isolation Precautions No  active isolations  Medications Medications   stroke: mapping our early stages of recovery book ( Does not apply Not Given 04/02/20 2339)  0.9 %  sodium chloride infusion ( Intravenous Stopped 04/03/20 1235)  acetaminophen (TYLENOL) tablet 650 mg (has no administration in time range)    Or  acetaminophen (TYLENOL) 160 MG/5ML solution 650 mg (has no administration in time range)    Or  acetaminophen (TYLENOL) suppository 650 mg (has no administration in time range)  senna-docusate (Senokot-S) tablet 1 tablet (has no administration in time range)  enoxaparin (LOVENOX) injection 40 mg (40 mg Subcutaneous Given 04/03/20 0000)  aspirin EC tablet 81 mg (81 mg Oral  Given 04/03/20 0913)  labetalol (NORMODYNE) injection 10 mg (has no administration in time range)  LORazepam (ATIVAN) injection 1 mg (has no administration in time range)  hydrOXYzine (ATARAX/VISTARIL) tablet 25 mg (has no administration in time range)  clopidogrel (PLAVIX) tablet 75 mg (has no administration in time range)  sodium chloride 0.9 % bolus 500 mL (0 mLs Intravenous Stopped 04/02/20 2359)  iohexol (OMNIPAQUE) 350 MG/ML injection 60 mL (60 mLs Intravenous Contrast Given 04/02/20 2220)  potassium chloride SA (KLOR-CON) CR tablet 20 mEq (20 mEq Oral Given 04/03/20 0053)  gadobutrol (GADAVIST) 1 MMOL/ML injection 10 mL (10 mLs Intravenous Contrast Given 04/03/20 1111)    Mobility walks

## 2020-04-03 NOTE — Evaluation (Addendum)
Occupational Therapy Evaluation Patient Details Name: Tracey [name] MRN: [id number] DOB: 1975-05-22 Today's Date: 04/03/2020    History of Present Illness     Clinical Impression   Tracey [name] is a 45 year old woman who presents with decreased coordination, strength and sensation of LLE. Patient's upper extremities are [facility] in regards to strength, coordination and sensation (very minimal reports of decreased sensation in back of arm). Patient demonstrates ability to perform bed mobility, ambulation on room and ADLs with increased time due to bradykinesia of LLE and patient reporting feeling instability. Initially ambulated with RW, then hand hold and then short distance without. Recommended RW for longer distances for safety. At this patient has no OT needs. 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 Patient reports her son can assist her at home. Will defer to PT for further recommendations.    Follow Up Recommendations       Equipment Recommendations       Recommendations for Other Services       Precautions / Restrictions Restrictions Weight Bearing Restrictions: No      Mobility Bed Mobility                    Transfers                      Balance                                           ADL either performed or assessed with clinical judgement   ADL                                               Vision         Perception     Praxis      Pertinent Vitals/Pain Pain Assessment: No/denies pain     Hand Dominance Right   Extremity/Trunk Assessment             Communication     Cognition     Overall Cognitive Status: Impaired/Different from baseline                                     General Comments       Exercises     Shoulder Instructions      Home Living  Family/patient expects to be discharged to:: Private residence Living Arrangements: Spouse/significant other Available Help at Discharge: Family Type of Home: House                              Lives With: Spouse;Family    Prior Functioning/Environment                   OT Problem List:        OT Treatment/Interventions:      OT Goals(Current goals can be found in the care plan section)    OT Frequency:     Barriers to D/C:            Co-evaluation  AM-PAC OT "6 Clicks" Daily Activity     Outcome Measure                 End of Session    Activity Tolerance:   Patient left:                     Time:  -    Charges:     Waldron Session OTR/L Acute Care Rehab Services  Office [phone] Pager: [phone]   Kelli Churn 04/03/2020, 4:14 PM

## 2020-04-04 ENCOUNTER — Observation Stay (HOSPITAL_BASED_OUTPATIENT_CLINIC_OR_DEPARTMENT_OTHER): Payer: 59

## 2020-04-04 DIAGNOSIS — I6330 Cerebral infarction due to thrombosis of unspecified cerebral artery: Secondary | ICD-10-CM | POA: Insufficient documentation

## 2020-04-04 DIAGNOSIS — I639 Cerebral infarction, unspecified: Secondary | ICD-10-CM | POA: Diagnosis not present

## 2020-04-04 DIAGNOSIS — F32A Depression, unspecified: Secondary | ICD-10-CM | POA: Diagnosis not present

## 2020-04-04 DIAGNOSIS — F411 Generalized anxiety disorder: Secondary | ICD-10-CM | POA: Diagnosis not present

## 2020-04-04 DIAGNOSIS — E876 Hypokalemia: Secondary | ICD-10-CM | POA: Diagnosis not present

## 2020-04-04 LAB — RPR: RPR Ser Ql: NONREACTIVE

## 2020-04-04 MED ORDER — CLOPIDOGREL BISULFATE 75 MG PO TABS: 75.0000 mg | ORAL_TABLET | Freq: Every day | ORAL | 0 refills | Status: DC

## 2020-04-04 MED ORDER — ROSUVASTATIN CALCIUM 10 MG PO TABS: 10.0000 mg | ORAL_TABLET | Freq: Every day | ORAL | 0 refills | Status: DC

## 2020-04-04 MED ORDER — CLOPIDOGREL BISULFATE 75 MG PO TABS: 75.0000 mg | ORAL_TABLET | Freq: Every day | ORAL | 0 refills | Status: AC

## 2020-04-04 MED ORDER — ASPIRIN 81 MG PO TBEC: 81.0000 mg | DELAYED_RELEASE_TABLET | Freq: Every day | ORAL | 0 refills | Status: DC

## 2020-04-04 MED ORDER — ASPIRIN 81 MG PO TBEC: 81.0000 mg | DELAYED_RELEASE_TABLET | Freq: Every day | ORAL | 0 refills | Status: AC

## 2020-04-04 NOTE — Progress Notes (Signed)
TCD bubble and lower extremity venous has been completed.   Preliminary results in CV Proc.   Blanch Media 04/04/2020 1:44 PM

## 2020-04-04 NOTE — Discharge Summary (Addendum)
Physician Discharge Summary  Tracey [name] EHU:[id number] DOB: [birth year] DOA: 04/02/2020  PCP: Kathyrn Lass, MD  Admit date: 04/02/2020 Discharge date: 04/04/2020  Admitted From: Home  Disposition:  Home   Recommendations for Outpatient Follow-up and new medication changes:  1. Follow up with Dr. Kathyrn Lass in 2 weeks.  2. Follow up with Neurology as outpatient 3. Placed on dual antiplatelet therapy for 3 weeks the continue with aspirin alone.  4. Started on rosuvastatin.  5. Follow up with hypercoagulable panel as outpatient.  6. Will need outpatient TEE.   Home Health: no   Equipment/Devices: no    Discharge Condition: stable  CODE STATUS: full  Diet recommendation:  Heart healthy   Brief/Interim Summary: Mrs. Rikard was admitted to the hospital with a working diagnosis of small acute infarct in the juxtacortical high right posterior frontal lobe, minimal edema without mass-effect.  45 year old female with past medical history significant for hypertension, depression, dyslipidemia, anxiety, headaches and obesity.  She reported acute onset of neck discomfort, left arm and leg numbness, associated with weakness.  Symptoms started abruptly around 8:30 PM.  At the time of her initial physical examination her blood pressure was 156/73, heart rate 58, respiratory rate 16, oxygen saturation 99%.  Her lungs had no wheezing or rails, heart S1-S2, present rhythmic, her abdomen was soft, no lower extremity edema.  Her strength was preserved all 4 extremities, she had decreased sensation to touch distal left lower extremity and left upper extremity.  Normal reflexes.  Sodium 139, potassium 3.3, chloride 105, bicarb 23, glucose 93, BUN 10, creatinine 1.05.  White cell count 9.6, hemoglobin 13.6, hematocrit 41.5, platelets 267.  SARS COVID-19 negative.  Urine analysis specific gravity 1.004, 0-5 red cells, 0-5 white cells.  Toxicology screen negative, alcohol less than 10.  Head CT no acute  changes. EKG 63 bpm, normal axis, normal intervals, sinus rhythm, no ST segment or T wave changes.  She was admitted to the telemetry ward, frequent neurochecks, and further neurologic work-up.  1.  Small acute infarct in the juxtacortical high right posterior frontal lobe.   Patient underwent further work-up with brain MRI which showed small acute infarct in the juxtacortical high right posterior frontal lobe. CT head and neck angiography, which resulted normal. Transthoracic echocardiography with left ventricle ejection fraction 55 to 60% with no significant valvular disease.  Patient was placed on medical therapy including rosuvastatin and dual antiplatelet therapy, aspirin/clopidogrel. Plan to continue aspirin/clopidogrel for 3 weeks then continue aspirin alone.  Patient will need further work-up with bubble study and possible transesophageal echocardiography as an outpatient.  Considering her young age, further work-up with ANA, ESR and sickle cell screen where ordered, results pending.  2. Hypertension.  Continue blood pressure control with lisinopril.  3. Dyslipidemia.  LDL 101, patient has been placed on rosuvastatin.  4. Obesity class I.  Calculated body mass index 34.7, will need outpatient follow-up, including screen for sleep apnea.  5. Anxiety/depression.  Continue hydroxyzine and Wellbutrin.  6. Hypokalemia. Potassium was corrected, at discharge K up to 3,4 to 3,7,   Discharge Diagnoses:  Principal Problem:   Cerebral thrombosis with cerebral infarction Active Problems:   Anxiety, generalized   Hypertension   Depression   Hypokalemia    Discharge Instructions   Allergies as of 04/04/2020   No Known Allergies     Medication List    STOP taking these medications   pantoprazole 40 MG tablet Commonly known as: Protonix   Prenatal 27-1 MG Tabs  TAKE these medications   aspirin 81 MG EC tablet Take 1 tablet (81 mg total) by mouth daily. Swallow  whole.   buPROPion 75 MG tablet Commonly known as: WELLBUTRIN Take 75 mg by mouth 2 (two) times daily.   clopidogrel 75 MG tablet Commonly known as: PLAVIX Take 1 tablet (75 mg total) by mouth daily for 21 days.   COD LIVER OIL PO Take 1 capsule by mouth daily.   hydrOXYzine 25 MG capsule Commonly known as: VISTARIL Take 25 mg by mouth daily as needed.   lisinopril 20 MG tablet Commonly known as: ZESTRIL Take 20 mg by mouth daily.   multivitamin with minerals tablet Take 1 tablet by mouth daily.   PROBIOTIC DAILY PO Take 1 tablet by mouth daily.   rosuvastatin 10 MG tablet Commonly known as: CRESTOR Take 1 tablet (10 mg total) by mouth daily.   Vitamin D (Ergocalciferol) 1.25 MG (50000 UNIT) Caps capsule Commonly known as: DRISDOL Take 1 capsule (50,000 Units total) by mouth every 7 (seven) days.       No Known Allergies  Consultations:  Neurology    Procedures/Studies: CT Angio Head W or Wo Contrast  Result Date: 04/02/2020 CLINICAL DATA:  Left lower extremity weakness EXAM: CT ANGIOGRAPHY HEAD AND NECK TECHNIQUE: Multidetector CT imaging of the head and neck was performed using the standard protocol during bolus administration of intravenous contrast. Multiplanar CT image reconstructions and MIPs were obtained to evaluate the vascular anatomy. Carotid stenosis measurements (when applicable) are obtained utilizing NASCET criteria, using the distal internal carotid diameter as the denominator. CONTRAST:  63mL OMNIPAQUE IOHEXOL 350 MG/ML SOLN COMPARISON:  None. FINDINGS: CTA NECK FINDINGS SKELETON: There is no bony spinal canal stenosis. No lytic or blastic lesion. OTHER NECK: Normal pharynx, larynx and major salivary glands. No cervical lymphadenopathy. Unremarkable thyroid gland. UPPER CHEST: No pneumothorax or pleural effusion. No nodules or masses. AORTIC ARCH: There is no calcific atherosclerosis of the aortic arch. There is no aneurysm, dissection or  hemodynamically significant stenosis of the visualized portion of the aorta. Conventional 3 vessel aortic branching pattern. The visualized proximal subclavian arteries are widely patent. RIGHT CAROTID SYSTEM: Normal without aneurysm, dissection or stenosis. LEFT CAROTID SYSTEM: Normal without aneurysm, dissection or stenosis. VERTEBRAL ARTERIES: Left dominant configuration. Both origins are clearly patent. There is no dissection, occlusion or flow-limiting stenosis to the skull base (V1-V3 segments). CTA HEAD FINDINGS POSTERIOR CIRCULATION: --Vertebral arteries: Normal V4 segments. --Inferior cerebellar arteries: Normal. --Basilar artery: Normal. --Superior cerebellar arteries: Normal. --Posterior cerebral arteries (PCA): Normal. ANTERIOR CIRCULATION: --Intracranial internal carotid arteries: Normal. --Anterior cerebral arteries (ACA): Normal. Both A1 segments are present. Patent anterior communicating artery (a-comm). --Middle cerebral arteries (MCA): Normal. VENOUS SINUSES: As permitted by contrast timing, patent. ANATOMIC VARIANTS: None Review of the MIP images confirms the above findings. IMPRESSION: Normal CTA of the head and neck. Electronically Signed   By: Deatra Robinson M.D.   On: 04/02/2020 22:33   CT Angio Neck W and/or Wo Contrast  Result Date: 04/02/2020 CLINICAL DATA:  Left lower extremity weakness EXAM: CT ANGIOGRAPHY HEAD AND NECK TECHNIQUE: Multidetector CT imaging of the head and neck was performed using the standard protocol during bolus administration of intravenous contrast. Multiplanar CT image reconstructions and MIPs were obtained to evaluate the vascular anatomy. Carotid stenosis measurements (when applicable) are obtained utilizing NASCET criteria, using the distal internal carotid diameter as the denominator. CONTRAST:  81mL OMNIPAQUE IOHEXOL 350 MG/ML SOLN COMPARISON:  None. FINDINGS: CTA NECK FINDINGS SKELETON:  There is no bony spinal canal stenosis. No lytic or blastic lesion. OTHER  NECK: Normal pharynx, larynx and major salivary glands. No cervical lymphadenopathy. Unremarkable thyroid gland. UPPER CHEST: No pneumothorax or pleural effusion. No nodules or masses. AORTIC ARCH: There is no calcific atherosclerosis of the aortic arch. There is no aneurysm, dissection or hemodynamically significant stenosis of the visualized portion of the aorta. Conventional 3 vessel aortic branching pattern. The visualized proximal subclavian arteries are widely patent. RIGHT CAROTID SYSTEM: Normal without aneurysm, dissection or stenosis. LEFT CAROTID SYSTEM: Normal without aneurysm, dissection or stenosis. VERTEBRAL ARTERIES: Left dominant configuration. Both origins are clearly patent. There is no dissection, occlusion or flow-limiting stenosis to the skull base (V1-V3 segments). CTA HEAD FINDINGS POSTERIOR CIRCULATION: --Vertebral arteries: Normal V4 segments. --Inferior cerebellar arteries: Normal. --Basilar artery: Normal. --Superior cerebellar arteries: Normal. --Posterior cerebral arteries (PCA): Normal. ANTERIOR CIRCULATION: --Intracranial internal carotid arteries: Normal. --Anterior cerebral arteries (ACA): Normal. Both A1 segments are present. Patent anterior communicating artery (a-comm). --Middle cerebral arteries (MCA): Normal. VENOUS SINUSES: As permitted by contrast timing, patent. ANATOMIC VARIANTS: None Review of the MIP images confirms the above findings. IMPRESSION: Normal CTA of the head and neck. Electronically Signed   By: Ulyses Jarred M.D.   On: 04/02/2020 22:33   MR BRAIN W WO CONTRAST  Result Date: 04/03/2020 CLINICAL DATA:  Neuro deficit, acute stroke suspected. EXAM: MRI HEAD WITHOUT AND WITH CONTRAST TECHNIQUE: Multiplanar, multiecho pulse sequences of the brain and surrounding structures were obtained without and with intravenous contrast. CONTRAST:  24mL GADAVIST GADOBUTROL 1 MMOL/ML IV SOLN COMPARISON:  CT head April 02, 2020 FINDINGS: Brain: Small acute infarct in the  juxtacortical high right posterior frontal lobe (series 9, image 92). Minimal edema without mass effect. No acute hemorrhage. Additional scattered mild T2/FLAIR hyperintensities within the white matter, mildly advanced for age. No hydrocephalus. No mass lesion. No abnormal enhancement. No abnormal mass effect or extra-axial fluid collection. Vascular: Major arterial flow voids are maintained at the skull base. Skull and upper cervical spine: No focal marrow signal abnormality. Sinuses/Orbits: Visualized sinuses are largely clear. Unremarkable orbits. Other: No mastoid effusions. IMPRESSION: 1. 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 2. Additional mild scattered T2/FLAIR hyperintensities within the white matter, mildly advanced for age. These are nonspecific and could relate to chronic microvascular ischemic disease, prior migraines, trauma, demyelination, or inflammation. Electronically Signed   By: Margaretha Sheffield MD   On: 04/03/2020 11:33   ECHOCARDIOGRAM COMPLETE  Result Date: 04/03/2020    ECHOCARDIOGRAM REPORT   Patient Name:   Kayleana Petta Date of Exam: 04/03/2020 Medical Rec #:  [id number]    Height:       66.0 in Accession #:    [id number]   Weight:       215.0 lb Date of Birth:  [birth year]   BSA:          2.062 m Patient Age:    41 years     BP:           138/81 mmHg Patient Gender: F            HR:           71 bpm. Exam Location:  Inpatient Procedure: 2D Echo, Color Doppler and Cardiac Doppler Indications:    Stroke i163.9  History:        Patient has no prior history of Echocardiogram examinations.  Risk Factors:Hypertension.  Sonographer:    Raquel Sarna Senior RDCS Referring Phys: [id number] [place]  1. Left ventricular ejection fraction, by estimation, is 55 to 60%. The left ventricle has normal function. The left ventricle has no regional wall motion abnormalities. Left ventricular diastolic parameters  were normal.  2. Right ventricular systolic function is normal. The right ventricular size is normal.  3. Left atrial size was mildly dilated.  4. The mitral valve is normal in structure. Trivial mitral valve regurgitation. No evidence of mitral stenosis.  5. The aortic valve is normal in structure. Aortic valve regurgitation is not visualized. No aortic stenosis is present.  6. The inferior vena cava is normal in size with greater than 50% respiratory variability, suggesting right atrial pressure of 3 mmHg. FINDINGS  Left Ventricle: Left ventricular ejection fraction, by estimation, is 55 to 60%. The left ventricle has normal function. The left ventricle has no regional wall motion abnormalities. The left ventricular internal cavity size was normal in size. There is  no left ventricular hypertrophy. Left ventricular diastolic parameters were normal. Right Ventricle: The right ventricular size is normal. No increase in right ventricular wall thickness. Right ventricular systolic function is normal. Left Atrium: Left atrial size was mildly dilated. Right Atrium: Right atrial size was normal in size. Pericardium: There is no evidence of pericardial effusion. Mitral Valve: The mitral valve is normal in structure. Trivial mitral valve regurgitation. No evidence of mitral valve stenosis. Tricuspid Valve: The tricuspid valve is normal in structure. Tricuspid valve regurgitation is trivial. No evidence of tricuspid stenosis. Aortic Valve: The aortic valve is normal in structure. Aortic valve regurgitation is not visualized. No aortic stenosis is present. Pulmonic Valve: The pulmonic valve was normal in structure. Pulmonic valve regurgitation is not visualized. No evidence of pulmonic stenosis. Aorta: The aortic root is normal in size and structure. Venous: The inferior vena cava is normal in size with greater than 50% respiratory variability, suggesting right atrial pressure of 3 mmHg. IAS/Shunts: No atrial level shunt  detected by color flow Doppler.  LEFT VENTRICLE PLAX 2D LVIDd:         4.60 cm  Diastology LVIDs:         3.40 cm  LV e' medial:    11.70 cm/s LV PW:         1.00 cm  LV E/e' medial:  8.1 LV IVS:        1.00 cm  LV e' lateral:   13.80 cm/s LVOT diam:     2.00 cm  LV E/e' lateral: 6.9 LV SV:         74 LV SV Index:   36 LVOT Area:     3.14 cm  LEFT ATRIUM             Index       RIGHT ATRIUM           Index LA diam:        4.00 cm 1.94 cm/m  RA Area:     14.10 cm LA Vol (A2C):   58.2 ml 28.22 ml/m RA Volume:   34.50 ml  16.73 ml/m LA Vol (A4C):   40.0 ml 19.40 ml/m LA Biplane Vol: 48.3 ml 23.42 ml/m  AORTIC VALVE LVOT Vmax:   117.00 cm/s LVOT Vmean:  73.700 cm/s LVOT VTI:    0.235 m  AORTA Ao Root diam: 2.80 cm Ao Asc diam:  3.10 cm MITRAL VALVE MV Area (PHT): 3.77 cm  SHUNTS MV Decel Time: 201 msec    Systemic VTI:  0.24 m MV E velocity: 94.70 cm/s  Systemic Diam: 2.00 cm MV A velocity: 45.40 cm/s MV E/A ratio:  2.09 Jenkins Rouge MD Electronically signed by Jenkins Rouge MD Signature Date/Time: 04/03/2020/9:29:05 AM    Final    CT HEAD CODE STROKE WO CONTRAST  Result Date: 04/02/2020 CLINICAL DATA:  Code stroke. Sudden onset left lower extremity weakness EXAM: CT HEAD WITHOUT CONTRAST TECHNIQUE: Contiguous axial images were obtained from the base of the skull through the vertex without intravenous contrast. COMPARISON:  None. FINDINGS: Brain: There is no mass, hemorrhage or extra-axial collection. The size and configuration of the ventricles and extra-axial CSF spaces are normal. The brain parenchyma is normal, without evidence of acute or chronic infarction. Vascular: No abnormal hyperdensity of the major intracranial arteries or dural venous sinuses. No intracranial atherosclerosis. Skull: The visualized skull base, calvarium and extracranial soft tissues are normal. Sinuses/Orbits: No fluid levels or advanced mucosal thickening of the visualized paranasal sinuses. No mastoid or middle ear effusion.  The orbits are normal. ASPECTS [facility] Stroke Program Early CT Score) - Ganglionic level infarction (caudate, lentiform nuclei, internal capsule, insula, M1-M3 cortex): 7 - Supraganglionic infarction (M4-M6 cortex): 3 Total score (0-10 with 10 being normal): 10 IMPRESSION: 1. Normal head CT. 2. ASPECTS is 10. These results were called by telephone at the time of interpretation on 04/02/2020 at 10:19 pm to provider [facility] , who verbally acknowledged these results. Electronically Signed   By: Ulyses Jarred M.D.   On: 04/02/2020 22:19        Subjective: Patient is feeling better. Mild left temporal headache, no nausea or vomiting, lower extremity strength and sensation have improved.   Discharge Exam: Vitals:   04/04/20 0000 04/04/20 0418  BP: 139/84 (!) 136/94  Pulse: (!) 56 (!) 54  Resp: 18 18  Temp: 98.3 F (36.8 C) 98.7 F (37.1 C)  SpO2: 98% 100%   Vitals:   04/03/20 1956 04/03/20 2227 04/04/20 0000 04/04/20 0418  BP: 133/77 140/83 139/84 (!) 136/94  Pulse: 65  (!) 56 (!) 54  Resp: $Remo'17 18 18 18  'unvTE$ Temp: 97.6 F (36.4 C) 98.3 F (36.8 C) 98.3 F (36.8 C) 98.7 F (37.1 C)  TempSrc: Oral Oral Oral Oral  SpO2: 99% 98% 98% 100%  Weight:      Height:        General: Not in pain or dyspnea,  Neurology: Awake and alert, non focal  E ENT: mild pallor, no icterus, oral mucosa moist Cardiovascular: No JVD. S1-S2 present, rhythmic, no gallops, rubs, or murmurs. No lower extremity edema. Pulmonary: positive breath sounds bilaterally, adequate air movement, no wheezing, rhonchi or rales. Gastrointestinal. Abdomen soft and non tender Skin. No rashes Musculoskeletal: no joint deformities   The results of significant diagnostics from this hospitalization (including imaging, microbiology, ancillary and laboratory) are listed below for reference.     Microbiology: Recent Results (from the past 240 hour(s))  SARS CORONAVIRUS 2 (TAT 6-24 HRS) Nasopharyngeal Nasopharyngeal Swab      Status: None   Collection Time: 04/02/20 10:48 PM   Specimen: Nasopharyngeal Swab  Result Value Ref Range Status   SARS Coronavirus 2 NEGATIVE NEGATIVE Final    Comment: (NOTE) SARS-CoV-2 target nucleic acids are NOT DETECTED.  The SARS-CoV-2 RNA is generally detectable in upper and lower respiratory specimens during the acute phase of infection. Negative results do not preclude SARS-CoV-2 infection, do not rule out co-infections  with other pathogens, and should not be used as the sole basis for treatment or other patient management decisions. Negative results must be combined with clinical observations, patient history, and epidemiological information. The expected result is Negative.  Fact Sheet for Patients: SugarRoll.be  Fact Sheet for Healthcare Providers: https://www.woods-mathews.com/  This test is not yet approved or cleared by the [place] FDA and  has been authorized for detection and/or diagnosis of SARS-CoV-2 by FDA under an Emergency Use Authorization (EUA). This EUA will remain  in effect (meaning this test can be used) for the duration of the COVID-19 declaration under Se ction 564(b)(1) of the Act, 21 U.S.C. section 360bbb-3(b)(1), unless the authorization is terminated or revoked sooner.  Performed at [facility], [place] [address] [place], [place] [address]      Labs: BNP (last 3 results) No results for input(s): BNP in the last 8760 hours. Basic Metabolic Panel: Recent Labs  Lab 04/02/20 2155 04/02/20 2250 04/03/20 0343  NA 139 139 140  K 3.3* 3.7 3.4*  CL 105 105 109  CO2 23  --  24  GLUCOSE 93 94 125*  BUN $Re'10 10 9  'bUg$ CREATININE 1.05* 1.10* 1.07*  CALCIUM 9.4  --  8.5*   Liver Function Tests: Recent Labs  Lab 04/02/20 2155 04/03/20 0343  AST 17 13*  ALT 22 19  ALKPHOS 47 41  BILITOT 0.3 0.3  PROT 7.6 6.2*  ALBUMIN 4.3 3.5   No results for input(s): LIPASE, AMYLASE in the last 168  hours. No results for input(s): AMMONIA in the last 168 hours. CBC: Recent Labs  Lab 04/02/20 2117 04/02/20 2250 04/03/20 0343  WBC 9.6  --  8.2  NEUTROABS 4.7  --   --   HGB 13.6 14.3 12.3  HCT 41.5 42.0 37.5  MCV 89.4  --  90.1  PLT 267  --  249   Cardiac Enzymes: No results for input(s): CKTOTAL, CKMB, CKMBINDEX, TROPONINI in the last 168 hours. BNP: Invalid input(s): POCBNP CBG: Recent Labs  Lab 04/02/20 2153  GLUCAP 97   D-Dimer No results for input(s): DDIMER in the last 72 hours. Hgb A1c Recent Labs    04/03/20 0343  HGBA1C 5.1   Lipid Profile Recent Labs    04/03/20 0343  CHOL 149  HDL 37*  LDLCALC 101*  TRIG 55  CHOLHDL 4.0   Thyroid function studies No results for input(s): TSH, T4TOTAL, T3FREE, THYROIDAB in the last 72 hours.  Invalid input(s): FREET3 Anemia work up No results for input(s): VITAMINB12, FOLATE, FERRITIN, TIBC, IRON, RETICCTPCT in the last 72 hours. Urinalysis    Component Value Date/Time   COLORURINE STRAW (A) 04/02/2020 2117   APPEARANCEUR CLEAR 04/02/2020 2117   LABSPEC 1.004 (L) 04/02/2020 2117   PHURINE 7.0 04/02/2020 2117   GLUCOSEU NEGATIVE 04/02/2020 2117   HGBUR NEGATIVE 04/02/2020 2117   BILIRUBINUR NEGATIVE 04/02/2020 2117   BILIRUBINUR neg 10/10/2017 1604   KETONESUR NEGATIVE 04/02/2020 2117   PROTEINUR NEGATIVE 04/02/2020 2117   UROBILINOGEN 1.0 09/23/2010 1605   NITRITE NEGATIVE 04/02/2020 2117   LEUKOCYTESUR SMALL (A) 04/02/2020 2117   Sepsis Labs Invalid input(s): PROCALCITONIN,  WBC,  LACTICIDVEN Microbiology Recent Results (from the past 240 hour(s))  SARS CORONAVIRUS 2 (TAT 6-24 HRS) Nasopharyngeal Nasopharyngeal Swab     Status: None   Collection Time: 04/02/20 10:48 PM   Specimen: Nasopharyngeal Swab  Result Value Ref Range Status   SARS Coronavirus 2 NEGATIVE NEGATIVE Final    Comment: (  NOTE) SARS-CoV-2 target nucleic acids are NOT DETECTED.  The SARS-CoV-2 RNA is generally detectable in  upper and lower respiratory specimens during the acute phase of infection. Negative results do not preclude SARS-CoV-2 infection, do not rule out co-infections with other pathogens, and should not be used as the sole basis for treatment or other patient management decisions. Negative results must be combined with clinical observations, patient history, and epidemiological information. The expected result is Negative.  Fact Sheet for Patients: SugarRoll.be  Fact Sheet for Healthcare Providers: https://www.woods-mathews.com/  This test is not yet approved or cleared by the [place] FDA and  has been authorized for detection and/or diagnosis of SARS-CoV-2 by FDA under an Emergency Use Authorization (EUA). This EUA will remain  in effect (meaning this test can be used) for the duration of the COVID-19 declaration under Se ction 564(b)(1) of the Act, 21 U.S.C. section 360bbb-3(b)(1), unless the authorization is terminated or revoked sooner.  Performed at [facility], [place] [address], [place], [place] [address]      Time coordinating discharge: 45 minutes  SIGNED:   Tawni Millers, MD  [facility] 04/04/2020, 6:28 AM

## 2020-04-04 NOTE — Progress Notes (Signed)
STROKE TEAM PROGRESS NOTE   HISTORY OF PRESENT ILLNESS (per record) Tracey Propstis a 45 y.o.femalewith medical history significant forhypertension, depression, anxiety, and headaches, now presenting to the emergency department for evaluation of acute onset neck discomfort, left leg numbness and weakness, and left arm numbness. Patient had just started Wellbutrin today, was in her usual state of health, and then at 8:30 PM developed acute onset of numbness and weakness involving the left leg, numbness involving the left arm, and a pressure sensation at the base of her posterior neck in the midline. There was no associated headache or change in vision, no recent fall or trauma, no fevers or chills, no facial symptoms, no speech difficulty. She has never experienced similar symptoms before. ED Course:Upon arrival to the ED, patient is found to be afebrile, saturating well on room air, and with stable blood pressure. EKG features sinus rhythm. Chemistry panel notable for mild hypokalemia. CBC unremarkable. CT head and CTA head and neck were normal studies. COVID-19 screening test pending. Teleneurology evaluated the patient and made recommendations including admission, MRI brain with and without contrast, and echocardiogram. Patient was started on IV fluids. She passed swallow screen in the ED.   INTERVAL HISTORY Patient is lying comfortably in bed.  She feels her left leg heaviness and numbness have improved.  She is close to being back to her baseline.  She has no complaints.  ESR is 9 mm.  RPR, ANA, sickle cell screen and antiphospholipid antibodies are not yet back.    OBJECTIVE Vitals:   04/03/20 2227 04/04/20 0000 04/04/20 0418 04/04/20 0835  BP: 140/83 139/84 (!) 136/94 (!) 142/89  Pulse:  (!) 56 (!) 54   Resp: $Remo'18 18 18   'JuVKf$ Temp: 98.3 F (36.8 C) 98.3 F (36.8 C) 98.7 F (37.1 C) 98.2 F (36.8 C)  TempSrc: Oral Oral Oral Oral  SpO2: 98% 98% 100% 100%  Weight:      Height:         CBC:  Recent Labs  Lab 04/02/20 2117 04/02/20 2250 04/03/20 0343  WBC 9.6  --  8.2  NEUTROABS 4.7  --   --   HGB 13.6 14.3 12.3  HCT 41.5 42.0 37.5  MCV 89.4  --  90.1  PLT 267  --  638    Basic Metabolic Panel:  Recent Labs  Lab 04/02/20 2155 04/02/20 2250 04/03/20 0343  NA 139 139 140  K 3.3* 3.7 3.4*  CL 105 105 109  CO2 23  --  24  GLUCOSE 93 94 125*  BUN $Re'10 10 9  'AsG$ CREATININE 1.05* 1.10* 1.07*  CALCIUM 9.4  --  8.5*    Lipid Panel:     Component Value Date/Time   CHOL 149 04/03/2020 0343   TRIG 55 04/03/2020 0343   HDL 37 (L) 04/03/2020 0343   CHOLHDL 4.0 04/03/2020 0343   VLDL 11 04/03/2020 0343   LDLCALC 101 (H) 04/03/2020 0343   HgbA1c:  Lab Results  Component Value Date   HGBA1C 5.1 04/03/2020   Urine Drug Screen:     Component Value Date/Time   LABOPIA NONE DETECTED 04/02/2020 2155   COCAINSCRNUR NONE DETECTED 04/02/2020 2155   LABBENZ NONE DETECTED 04/02/2020 2155   AMPHETMU NONE DETECTED 04/02/2020 2155   THCU NONE DETECTED 04/02/2020 2155   LABBARB NONE DETECTED 04/02/2020 2155    Alcohol Level     Component Value Date/Time   ETH <10 04/02/2020 2155    IMAGING  CT Angio Head W  or Wo Contrast CT Angio Neck W and/or Wo Contrast 04/02/2020 IMPRESSION:  Normal CTA of the head and neck.   CT HEAD CODE STROKE WO CONTRAST 04/02/2020 IMPRESSION:  1. Normal head CT.  2. ASPECTS is 10.   MR BRAIN W WO CONTRAST 04/03/2020 IMPRESSION:  1. 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  2. Additional mild scattered T2/FLAIR hyperintensities within the white matter, mildly advanced for age. These are nonspecific and could relate to chronic microvascular ischemic disease, prior migraines, trauma, demyelination, or inflammation.   ECHOCARDIOGRAM COMPLETE 04/03/2020 IMPRESSIONS   1. Left ventricular ejection fraction, by estimation, is 55 to 60%. The left ventricle has  normal function. The left ventricle has no regional wall motion abnormalities. Left ventricular diastolic parameters were normal.   2. Right ventricular systolic function is normal. The right ventricular size is normal.   3. Left atrial size was mildly dilated.   4. The mitral valve is normal in structure. Trivial mitral valve regurgitation. No evidence of mitral stenosis.   5. The aortic valve is normal in structure. Aortic valve regurgitation is not visualized. No aortic stenosis is present.   6. The inferior vena cava is normal in size with greater than 50% respiratory variability, suggesting right atrial pressure of 3 mmHg.   ECG - SR rate 63 BPM. (See cardiology reading for complete details)  PHYSICAL EXAM Blood pressure (!) 142/89, pulse (!) 54, temperature 98.2 F (36.8 C), temperature source Oral, resp. rate 18, height $RemoveBe'5\' 6"'SAuNsCKpH$  (1.676 m), weight 97.5 kg, SpO2 100 %. Pleasant obese middle-aged African-American lady not in distress. . Afebrile. Head is nontraumatic. Neck is supple without bruit.    Cardiac exam no murmur or gallop. Lungs are clear to auscultation. Distal pulses are well felt.  Neurological Exam ;  Awake  Alert oriented x 3. Normal speech and language.eye movements full without nystagmus.fundi were not visualized. Vision acuity and fields appear normal. Hearing is normal. Palatal movements are normal. Face symmetric. Tongue midline. Normal strength, tone, reflexes and coordination. Normal sensation. Gait deferred.    ASSESSMENT/PLAN [name] is a 45 y.o. female with history of hypertension, anxiety (panic attacks), headaches (migraines), ongoing tobacco use, and depression (just started Wellbutrin on the day of admission) presenting to the [facility] emergency department and seen by teleNeurology for acute onset neck discomfort, left leg numbness / weakness, left arm numbness and apressure sensation at the base of her posterior neck in the midline. Pt noted to have mild  hypokalemia - 3.3. She declined IV tPA.  Stroke: Small acute infarct in the juxtacortical high right posterior frontal lobe possibly embolic - etiology unknown.  Resultant mild left leg weakness and numbness which is resolving  Code Stroke CT Head -  Normal head CT. ASPECTS is 10.      CT head - not ordered  MRI head - Small acute infarct in the juxtacortical high right posterior frontal lobe. Minimal edema without mass effect. Additional mild scattered T2/FLAIR hyperintensities within the white matter, mildly advanced for age. These are nonspecific and could relate to chronic microvascular ischemic disease, prior migraines, trauma, demyelination, or inflammation.  MRA head - not ordered  CTA H&N - normal  CT Perfusion - not ordered   Transcranial doppler bubble study- pending  Carotid Doppler - CTA neck ordered - carotid dopplers not indicated.  2D Echo - EF 55 - 60%. No cardiac source of emboli identified.   TEE - pending  Sars Corona Virus 2 - negative  LDL - 101  HgbA1c - 5.1  UDS - negative  VTE prophylaxis - Lovenox Diet  Diet Order            Diet Heart Room service appropriate? Yes; Fluid consistency: Thin  Diet effective now                 No antithrombotic prior to admission, now on aspirin 81 mg daily and clopidogrel 75 mg daily  Patient will be counseled to be compliant with her antithrombotic medications  Ongoing aggressive stroke risk factor management  Therapy recommendations:  Outpt PT recommended - OT eval pending  Disposition:  Pending  Hypertension   Home BP meds: lisinopril  Current BP meds: Labetalol prn  Stable . Permissive hypertension (OK if < 220/120) but gradually normalize in 5-7 days  . Long-term BP goal normotensive  Hyperlipidemia  Home Lipid lowering medication: none   LDL 101, goal < 70  Current lipid lowering medication: Crestor 10 mg daily  Continue statin at discharge  Other Stroke Risk Factors  Cigarette  smoker - advised to stop smoking  Obesity, Body mass index is 34.7 kg/m., recommend weight loss, diet and exercise as appropriate   Migraines  Other Active Problems, Findings, Recommendations and/or Plan  Code status - Full Code  Bradycardia - 50's  Hypokalemia - potassium - 3.3->3.7->3.4  Hypercoagulable / vasculitis labs - pending  TEE has been ordered - will order NPO after midnight and message cardiology.  Outpt sleep study recommended in consult note.   Hospital day # 0 Plan check trans cranial bubble study today and plan TEE tomorrow.  Mobilize out of bed.  Follow lab results.  Continue aspirin and Plavix for 3 weeks followed by aspirin alone and aggressive risk factor modification.  Sleep study as an outpatient.  Discussed with Dr. Cathlean Sauer.  Greater than 50% time during this 25-minute visit was spent in counseling and coordination of care with multiple stroke discussion.   Antony Contras, MD To contact Stroke Continuity provider, please refer to [email or web address] After hours, contact General Neurology

## 2020-04-04 NOTE — Progress Notes (Signed)
Patient IV removal well tolerated, to transport in family car, discharge instructions reveiwed with patient and teach back completed. Patient has all her belongings.

## 2020-04-04 NOTE — Evaluation (Signed)
Physical Therapy Evaluation Patient Details Name: Tracey [name] MRN: [id number] DOB: Jul 12, 1975 Today's Date: 04/04/2020   History of Present Illness  Patient is a 45 y/o female who presents with LLE weakness, LUE tingling and pressure in neck. Found to have infarct in right posterior frontal lobe. PMH includes HTN, depression, anxiety, headaches.  Clinical Impression  Patient presents with left sided weakness and decreased awareness of safety/deficits s/p above. Pt is independent and lives at home with spouse and 2 children (83 and 2 y/o) PTA. She works at home for [employer]. Today, pt requires Min guard assist for transfers and gait training with left knee instability. Reports LLE feels heavy. Discussed safety techniques for home re: decreasing need to leave house/negotiate steps and having 16 y/o stay with her for the first few days to assist with caring for the 43 y/o. Would benefit from neuro OPPT. Highly motivate to return to PLOF. Will follow acutely to maximize independence and mobility prior to return home. Will practice stairs next session as tolerated to prepare pt to enter home safely.    Follow Up Recommendations Outpatient PT (neuro)    Equipment Recommendations  None recommended by PT    Recommendations for Other Services       Precautions / Restrictions Precautions Precautions: Fall Restrictions Weight Bearing Restrictions: No      Mobility  Bed Mobility Overal bed mobility: Modified Independent             General bed mobility comments: increased time.    Transfers Overall transfer level: Needs assistance Equipment used: None Transfers: Sit to/from Stand Sit to Stand: Min guard         General transfer comment: Min guard for safety. Stood from EOB x2, slow to rise, no assist needed.  Ambulation/Gait Ambulation/Gait assistance: Min guard;Supervision Gait Distance (Feet): 40 Feet (x2 bouts) Assistive device: None Gait Pattern/deviations:  Step-to pattern;Step-through pattern;Decreased stance time - left Gait velocity: decreased   General Gait Details: Slow, mildly unsteady gait wtih left knee instability, but no buckling. "feels heavy" per pt report. 1 seated rest break.  Stairs            Wheelchair Mobility    Modified Rankin (Stroke Patients Only) Modified Rankin (Stroke Patients Only) Pre-Morbid Rankin Score: No symptoms Modified Rankin: Moderately severe disability     Balance Overall balance assessment: Needs assistance Sitting-balance support: Feet supported;No upper extremity supported Sitting balance-Leahy Scale: Good Sitting balance - Comments: Able to donn socks sitting EOB without difficulty or LOB.   Standing balance support: During functional activity Standing balance-Leahy Scale: Fair Standing balance comment: Min guard for safety.                             Pertinent Vitals/Pain Pain Assessment: No/denies pain    Home Living Family/patient expects to be discharged to:: Private residence Living Arrangements: Spouse/significant other Available Help at Discharge: Family;Available PRN/intermittently (45 y/o can be home) Type of Home: House Home Access: Stairs to enter   Entrance Stairs-Number of Steps: 8 Home Layout: Two level;Able to live on main level with bedroom/bathroom Home Equipment: None      Prior Function Level of Independence: Independent         Comments: Works from home. Has a 43 year old, 43 year old and a 43 year old. Works at home for [employer].     Hand Dominance   Dominant Hand: Right    Extremity/Trunk Assessment  Upper Extremity Assessment Upper Extremity Assessment: Defer to OT evaluation    Lower Extremity Assessment Lower Extremity Assessment: LLE deficits/detail LLE Deficits / Details: Grossly ~3/5 DF, 3+/5 knee extension, hip flexion. "heavy feeling"    Cervical / Trunk Assessment Cervical / Trunk Assessment: Normal   Communication   Communication: No difficulties  Cognition Arousal/Alertness: Awake/alert Behavior During Therapy: WFL for tasks assessed/performed Overall Cognitive Status: Impaired/Different from baseline Area of Impairment: Safety/judgement                         Safety/Judgement: Decreased awareness of deficits;Decreased awareness of safety     General Comments: Initially reports feeling fine with no deficits from stroke however upon assessment noted to have deficits; poor awareness.      General Comments General comments (skin integrity, edema, etc.): VSS.    Exercises     Assessment/Plan    PT Assessment Patient needs continued PT services  PT Problem List Decreased strength;Decreased mobility;Decreased safety awareness;Decreased range of motion;Decreased cognition       PT Treatment Interventions Therapeutic exercise;Gait training;Balance training;Neuromuscular re-education;Stair training;Functional mobility training;Therapeutic activities;Patient/family education;Cognitive remediation    PT Goals (Current goals can be found in the Care Plan section)  Acute Rehab PT Goals Patient Stated Goal: To return to normal PT Goal Formulation: With patient Time For Goal Achievement: 04/18/20 Potential to Achieve Goals: Good    Frequency Min 4X/week   Barriers to discharge Inaccessible home environment stairs    Co-evaluation               AM-PAC PT "6 Clicks" Mobility  Outcome Measure Help needed turning from your back to your side while in a flat bed without using bedrails?: None Help needed moving from lying on your back to sitting on the side of a flat bed without using bedrails?: None Help needed moving to and from a bed to a chair (including a wheelchair)?: A Little Help needed standing up from a chair using your arms (e.g., wheelchair or bedside chair)?: A Little Help needed to walk in hospital room?: A Little Help needed climbing 3-5 steps with  a railing? : A Little 6 Click Score: 20    End of Session Equipment Utilized During Treatment: Gait belt Activity Tolerance: Patient tolerated treatment well Patient left: in bed;with call bell/phone within reach;with bed alarm set Nurse Communication: Mobility status PT Visit Diagnosis: Hemiplegia and hemiparesis Hemiplegia - Right/Left: Left Hemiplegia - dominant/non-dominant: Non-dominant Hemiplegia - caused by: Cerebral infarction    Time: 0802-0820 PT Time Calculation (min) (ACUTE ONLY): 18 min   Charges:   PT Evaluation $PT Eval Moderate Complexity: 1 Mod          Vale Haven, PT, DPT Acute Rehabilitation Services Pager [phone] Office [phone]      Blake Divine A Lanier Ensign 04/04/2020, 9:38 AM

## 2020-04-04 NOTE — Care Management (Signed)
1432 04-04-20 Plan for patient to transition home today. Case Manager called the patient and she is agreeable to outpatient physical therapy. Physical Therapy recommendations for Neuro Rehab. Case Manager sent an ambulatory referral to neuro rehab via Epic. Office will call the patient to schedule a follow up appointment. No further needs from Case Manager at this time. Graves-Bigelow, Lamar Laundry, RN, BSN Case Manager

## 2020-04-05 LAB — ANA: Anti Nuclear Antibody (ANA): NEGATIVE

## 2020-04-05 SURGERY — ECHOCARDIOGRAM, TRANSESOPHAGEAL
Anesthesia: Monitor Anesthesia Care

## 2020-04-06 LAB — SICKLE CELL SCREEN: Sickle Cell Screen: NEGATIVE

## 2020-04-07 LAB — ANTIPHOSPHOLIPID SYNDROME EVAL, BLD
Anticardiolipin IgA: <9 APL U/mL (ref 0–11)
Anticardiolipin IgG: <9 GPL U/mL (ref 0–14)
Anticardiolipin IgM: <9 MPL U/mL (ref 0–12)
DRVVT: 36.3 s (ref 0.0–47.0)
PTT Lupus Anticoagulant: 38.8 s (ref 0.0–51.9)
Phosphatydalserine, IgA: <1 APS Units (ref 0–19)
Phosphatydalserine, IgG: <9 Units (ref 0–30)
Phosphatydalserine, IgM: 20 Units (ref 0–30)

## 2020-04-09 ENCOUNTER — Encounter: Payer: Self-pay | Admitting: Internal Medicine

## 2020-04-09 ENCOUNTER — Ambulatory Visit (INDEPENDENT_AMBULATORY_CARE_PROVIDER_SITE_OTHER): Payer: 59 | Admitting: Internal Medicine

## 2020-04-09 ENCOUNTER — Other Ambulatory Visit: Payer: Self-pay

## 2020-04-09 VITALS — BP 138/82 | HR 74 | Temp 98.0°F | Ht 66.0 in | Wt 213.0 lb

## 2020-04-09 DIAGNOSIS — Z1231 Encounter for screening mammogram for malignant neoplasm of breast: Secondary | ICD-10-CM

## 2020-04-09 DIAGNOSIS — I10 Essential (primary) hypertension: Secondary | ICD-10-CM | POA: Diagnosis not present

## 2020-04-09 DIAGNOSIS — Z0000 Encounter for general adult medical examination without abnormal findings: Secondary | ICD-10-CM

## 2020-04-09 DIAGNOSIS — I63321 Cerebral infarction due to thrombosis of right anterior cerebral artery: Secondary | ICD-10-CM

## 2020-04-09 DIAGNOSIS — F331 Major depressive disorder, recurrent, moderate: Secondary | ICD-10-CM | POA: Insufficient documentation

## 2020-04-09 DIAGNOSIS — E876 Hypokalemia: Secondary | ICD-10-CM

## 2020-04-09 DIAGNOSIS — Z0001 Encounter for general adult medical examination with abnormal findings: Secondary | ICD-10-CM

## 2020-04-09 LAB — BASIC METABOLIC PANEL
BUN: 12 mg/dL (ref 6–23)
CO2: 24 mEq/L (ref 19–32)
Calcium: 9.6 mg/dL (ref 8.4–10.5)
Chloride: 105 mEq/L (ref 96–112)
Creatinine, Ser: 1.07 mg/dL (ref 0.40–1.20)
GFR: 64.21 mL/min (ref >60.00)
Glucose, Bld: 93 mg/dL (ref 70–99)
Potassium: 3.9 mEq/L (ref 3.5–5.1)
Sodium: 137 mEq/L (ref 135–145)

## 2020-04-09 LAB — TSH: TSH: 2.93 u[IU]/mL (ref 0.35–4.50)

## 2020-04-09 LAB — MAGNESIUM: Magnesium: 1.9 mg/dL (ref 1.5–2.5)

## 2020-04-09 MED ORDER — VIIBRYD STARTER PACK 10 & 20 MG PO KIT: 1.0000 | PACK | Freq: Every day | ORAL | 0 refills | Status: DC

## 2020-04-09 NOTE — Progress Notes (Signed)
Subjective:  Patient ID: Tracey [name], female    DOB: [birth year]  Age: 45 y.o. MRN: [id number]  CC: Annual Exam  This visit occurred during the SARS-CoV-2 public health emergency.  Safety protocols were in place, including screening questions prior to the visit, additional usage of staff PPE, and extensive cleaning of exam room while observing appropriate contact time as indicated for disinfecting solutions.   NEW TO ME  HPI Tracey [name] presents for a CPX.  She recently developed weakness in her left upper extremity and left lower extremity and was admitted to the hospital after it was found that she had an infarct in her right frontal lobe.  She was evaluated by cardiology and neurology.  She tells me the paresthesias in her left upper and left lower extremity are getting better. She tells me she has an extensive family history of hypercoagulability.  She is being anticoagulated with aspirin and clopidogrel.  She is also taking a statin.  She has a history of depression.  She recently started bupropion which is helped some but she continues to complain of anhedonia, irritability, anxiety, and insomnia.  She previously took Lexapro and had a pretty good response.  She is tried Paxil in the past and did not have a good response to it.  History Tracey [name] has a past medical history of Anxiety, Bronchitis, Carpal tunnel syndrome on both sides, Depression, Family history of adverse reaction to anesthesia, Gestational diabetes mellitus, Headache, Hypertension, Oligohydramnios antepartum (11/09/2017), Panic attack (2017), and Reflux.   She has a past surgical history that includes Breast biopsy (right ) and Dilation and evacuation (N/A, 07/26/2015).   Her family history includes Diabetes in her maternal aunt and maternal uncle; Hypertension in her mother.She reports that she has been smoking cigarettes. She has been smoking about 0.25 packs per day. She has never used smokeless tobacco. She reports that she  does not drink alcohol and does not use drugs.  Outpatient Medications Prior to Visit  Medication Sig Dispense Refill  . aspirin 81 MG EC tablet Take 1 tablet (81 mg total) by mouth daily. Swallow whole. 30 tablet 0  . buPROPion (WELLBUTRIN) 75 MG tablet Take 75 mg by mouth 2 (two) times daily.    . clopidogrel (PLAVIX) 75 MG tablet Take 1 tablet (75 mg total) by mouth daily for 21 days. 21 tablet 0  . COD LIVER OIL PO Take 1 capsule by mouth daily.    . hydrOXYzine (VISTARIL) 25 MG capsule Take 25 mg by mouth daily as needed.    Marland Kitchen lisinopril (PRINIVIL,ZESTRIL) 20 MG tablet Take 20 mg by mouth daily.    . Multiple Vitamins-Minerals (MULTIVITAMIN WITH MINERALS) tablet Take 1 tablet by mouth daily.    . Probiotic Product (PROBIOTIC DAILY PO) Take 1 tablet by mouth daily.     . rosuvastatin (CRESTOR) 10 MG tablet Take 1 tablet (10 mg total) by mouth daily. 30 tablet 0  . Vitamin D, Ergocalciferol, (DRISDOL) 50000 units CAPS capsule Take 1 capsule (50,000 Units total) by mouth every 7 (seven) days. 30 capsule 2   No facility-administered medications prior to visit.    ROS Review of Systems  Constitutional: Negative.  Negative for appetite change, chills, diaphoresis, fatigue and fever.  HENT: Negative.   Eyes: Positive for visual disturbance (BV). Negative for redness.  Respiratory: Negative for apnea, cough, chest tightness, shortness of breath and wheezing.   Cardiovascular: Negative for chest pain, palpitations and leg swelling.  Gastrointestinal: Negative for abdominal pain, constipation,  diarrhea, nausea and vomiting.  Endocrine: Negative.   Genitourinary: Negative.  Negative for difficulty urinating.  Musculoskeletal: Negative.  Negative for arthralgias, back pain, myalgias and neck pain.  Skin: Negative.  Negative for color change.  Neurological: Positive for weakness (LUE and LLE). Negative for dizziness, tremors, syncope, light-headedness, numbness and headaches.  Hematological:  Negative for adenopathy. Does not bruise/bleed easily.  Psychiatric/Behavioral: Positive for dysphoric mood and sleep disturbance. Negative for behavioral problems, confusion, decreased concentration and suicidal ideas. The patient is nervous/anxious. The patient is not hyperactive.     Objective:  BP 138/82 (BP Location: Left Arm, Patient Position: Sitting, Cuff Size: Large)   Pulse 74   Temp 98 F (36.7 C) (Oral)   Ht 5\' 6"  (1.676 m)   Wt 213 lb (96.6 kg)   LMP 12/08/2017 (Approximate)   SpO2 98%   BMI 34.38 kg/m   Physical Exam Vitals reviewed.  Constitutional:      Appearance: Normal appearance.  HENT:     Nose: Nose normal.     Mouth/Throat:     Mouth: Mucous membranes are moist.  Eyes:     General: No scleral icterus.    Pupils: Pupils are equal, round, and reactive to light.  Cardiovascular:     Rate and Rhythm: Normal rate and regular rhythm.     Heart sounds: No murmur heard.   Pulmonary:     Effort: Pulmonary effort is normal.     Breath sounds: No stridor. No wheezing, rhonchi or rales.  Abdominal:     General: Abdomen is flat. Bowel sounds are normal. There is no distension.     Palpations: Abdomen is soft. There is no hepatomegaly, splenomegaly or mass.     Tenderness: There is no abdominal tenderness.  Musculoskeletal:        General: Normal range of motion.     Cervical back: Neck supple.     Right lower leg: No edema.     Left lower leg: No edema.  Lymphadenopathy:     Cervical: No cervical adenopathy.  Skin:    General: Skin is warm and dry.     Coloration: Skin is not pale.  Neurological:     General: No focal deficit present.     Mental Status: She is alert and oriented to person, place, and time. Mental status is at baseline.     Cranial Nerves: Cranial nerves are intact.     Sensory: Sensation is intact.     Motor: Motor function is intact.     Coordination: Coordination is intact.     Deep Tendon Reflexes: Reflexes normal.  Psychiatric:         Mood and Affect: Mood normal.        Behavior: Behavior normal.     Lab Results  Component Value Date   WBC 8.2 04/03/2020   HGB 12.3 04/03/2020   HCT 37.5 04/03/2020   PLT 249 04/03/2020   GLUCOSE 93 04/09/2020   CHOL 149 04/03/2020   TRIG 55 04/03/2020   HDL 37 (L) 04/03/2020   LDLCALC 101 (H) 04/03/2020   ALT 19 04/03/2020   AST 13 (L) 04/03/2020   NA 137 04/09/2020   K 3.9 04/09/2020   CL 105 04/09/2020   CREATININE 1.07 04/09/2020   BUN 12 04/09/2020   CO2 24 04/09/2020   TSH 2.93 04/09/2020   INR 0.9 04/02/2020   HGBA1C 5.1 04/03/2020    Assessment & Plan:   Tracey [name] was seen today for annual  exam.  Diagnoses and all orders for this visit:  Moderate episode of recurrent major depressive disorder ([place])- Will add vilazodone to the bupropion. -     Vilazodone HCl (VIIBRYD STARTER PACK) 10 & 20 MG KIT; Take 1 tablet by mouth daily. 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 -     Hypercoagulable panel, comprehensive; Future -     Hypercoagulable panel, comprehensive  Primary hypertension- Her BP is adequately well controlled. -     TSH; Future -     Basic metabolic panel; Future -     Magnesium; Future -     TSH -     Basic metabolic panel -     Magnesium  Hypokalemia- Her K+ level is normal now. -     Magnesium; Future -     Magnesium  Encounter for general adult medical examination with abnormal findings- Exam completed, labs reviewed, vaccines reviewed, cancer screenings addressed, pt ed was given.  Visit for screening mammogram -     MM DIGITAL SCREENING BILATERAL; Future   I have discontinued Tracey [name] Vitamin D (Ergocalciferol). I am also having her start on [employer]. Additionally, I am having her maintain her Probiotic Product (PROBIOTIC DAILY PO), lisinopril, buPROPion, hydrOXYzine, COD LIVER OIL PO, multivitamin  with minerals, aspirin, rosuvastatin, and clopidogrel.  Meds ordered this encounter  Medications  . Vilazodone HCl (VIIBRYD STARTER PACK) 10 & 20 MG KIT    Sig: Take 1 tablet by mouth daily.    Dispense:  1 kit    Refill:  0     Follow-up: Return in about 3 months (around 07/08/2020).  Scarlette Calico, MD

## 2020-04-09 NOTE — Patient Instructions (Signed)
Major Depressive Disorder, Adult Major depressive disorder (MDD) is a mental health condition. It may also be called clinical depression or unipolar depression. MDD causes symptoms of sadness, hopelessness, and loss of interest in things. These symptoms last most of the day, almost every day, for 2 weeks. MDD can also cause physical symptoms. It can interfere with relationships and with everyday activities, such as work, school, and activities that are usually pleasant. MDD may be mild, moderate, or severe. It may be single-episode MDD, which happens once, or recurrent MDD, which may occur multiple times. What are the causes? The exact cause of this condition is not known. MDD is most likely caused by a combination of things, which may include:  Your personality traits.  Learned or conditioned behaviors or thoughts or feelings that reinforce negativity.  Any alcohol or substance misuse.  Long-term (chronic) physical or mental health illness.  Going through a traumatic experience or major life changes. What increases the risk? The following factors may make someone more likely to develop MDD:  A family history of depression.  Being a woman.  Troubled family relationships.  Abnormally low levels of certain brain chemicals.  Traumatic or painful events in childhood, especially abuse or loss of a parent.  A lot of stress from life experiences, such as poor living conditions or discrimination.  Chronic physical illness or other mental health disorders. What are the signs or symptoms? The main symptoms of MDD usually include:  Constant depressed or irritable mood.  A loss of interest in things and activities. Other symptoms include:  Sleeping or eating too much or too little.  Unexplained weight gain or weight loss.  Tiredness or low energy.  Being agitated, restless, or weak.  Feeling hopeless, worthless, or guilty.  Trouble thinking clearly or making  decisions.  Thoughts of suicide or thoughts of harming others.  Isolating oneself or avoiding other people or activities.  Trouble completing tasks, work, or any normal obligations. Severe symptoms of this condition may include:  Psychotic depression.This may include false beliefs, or delusions. It may also include seeing, hearing, tasting, smelling, or feeling things that are not real (hallucinations).  Chronic depression or persistent depressive disorder. This is low-level depression that lasts for at least 2 years.  Melancholic depression, or feeling extremely sad and hopeless.  Catatonic depression, which includes trouble speaking and trouble moving. How is this diagnosed? This condition may be diagnosed based on:  Your symptoms.  Your medical and mental health history. You may be asked questions about your lifestyle, including any drug and alcohol use.  A physical exam.  Blood tests to rule out other conditions. MDD is confirmed if you have the following symptoms most of the day, nearly every day, in a 2-week period:  Either a depressed mood or loss of interest.  At least four other MDD symptoms. How is this treated? This condition is usually treated by mental health professionals, such as psychologists, psychiatrists, and clinical social workers. You may need more than one type of treatment. Treatment may include:  Psychotherapy, also called talk therapy or counseling. Types of psychotherapy include: ? Cognitive behavioral therapy (CBT). This teaches you to recognize unhealthy feelings, thoughts, and behaviors, and replace them with positive thoughts and actions. ? Interpersonal therapy (IPT). This helps you to improve the way you communicate with others or relate to them. ? Family therapy. This treatment includes members of your family.  Medicines to treat anxiety and depression. These medicines help to balance the brain chemicals   that affect your emotions.  Lifestyle  changes. You may be asked to: ? Limit alcohol use and avoid drug use. ? Get regular exercise. ? Get plenty of sleep. ? Make healthy eating choices. ? Spend more time outdoors.  Brain stimulation. This may be done if symptoms are very severe and other treatments have not worked. Examples of this treatment are electroconvulsive therapy and transcranial magnetic stimulation. Follow these instructions at home: Activity  Exercise regularly and spend time outdoors.  Find activities that you enjoy doing, and make time to do them.  Find healthy ways to manage stress, such as: ? Meditation or deep breathing. ? Spending time in nature. ? Journaling.  Return to your normal activities as told by your health care provider. Ask your health care provider what activities are safe for you. Alcohol and drug use  If you drink alcohol: ? Limit how much you use to:  0-1 drink a day for women who are not pregnant.  0-2 drinks a day for men. ? Be aware of how much alcohol is in your drink. In the [place] one drink equals one 12 oz bottle of beer (355 mL), one 5 oz glass of wine (148 mL), or one 1 oz glass of hard liquor (44 mL). ? Discuss your alcohol use with your health care provider. Alcohol can affect any antidepressant medicines you are taking.  Discuss any drug use with your health care provider. General instructions  Take over-the-counter and prescription medicines only as told by your health care provider.  Eat a healthy diet and get plenty of sleep.  Consider joining a support group. Your health care provider may be able to recommend one.  Keep all follow-up visits as told by your health care provider. This is important.   Where to find more information  National Alliance on Mental Illness: www.nami.org  [place] National Institute of Mental Health: www.nimh.nih.gov Contact a health care provider if:  Your symptoms get worse.  You develop new symptoms. Get help right away if:  You  self-harm.  You have serious thoughts about hurting yourself or others.  You hallucinate. If you ever feel like you may hurt yourself or others, or have thoughts about taking your own life, get help right away. Go to your nearest emergency department or:  Call your local emergency services (911 in the [place]  Call a suicide crisis helpline, such as the National Suicide Prevention Lifeline at 1-800-273-8255. This is open 24 hours a day in the [place]  Text the Crisis Text Line at 741741 (in the [place] Summary  Major depressive disorder (MDD) is a mental health condition. MDD causes symptoms of sadness, hopelessness, and loss of interest in things. These symptoms last most of the day, almost every day, for 2 weeks.  The symptoms of MDD can interfere with relationships and with everyday activities.  Treatments and support are available for people who develop MDD. You may need more than one type of treatment.  Get help right away if you have serious thoughts about hurting yourself or others. This information is not intended to replace advice given to you by your health care provider. Make sure you discuss any questions you have with your health care provider. Document Revised: 02/15/2019 Document Reviewed: 02/15/2019 Elsevier Patient Education  2021 Elsevier Inc.  

## 2020-04-10 ENCOUNTER — Encounter (INDEPENDENT_AMBULATORY_CARE_PROVIDER_SITE_OTHER): Payer: Self-pay

## 2020-04-12 ENCOUNTER — Other Ambulatory Visit: Payer: Self-pay

## 2020-04-12 ENCOUNTER — Ambulatory Visit: Payer: 59 | Attending: Internal Medicine

## 2020-04-12 ENCOUNTER — Telehealth: Payer: Self-pay

## 2020-04-12 DIAGNOSIS — R2681 Unsteadiness on feet: Secondary | ICD-10-CM | POA: Diagnosis not present

## 2020-04-12 DIAGNOSIS — R2689 Other abnormalities of gait and mobility: Secondary | ICD-10-CM | POA: Diagnosis present

## 2020-04-12 DIAGNOSIS — M6281 Muscle weakness (generalized): Secondary | ICD-10-CM | POA: Diagnosis present

## 2020-04-12 DIAGNOSIS — R262 Difficulty in walking, not elsewhere classified: Secondary | ICD-10-CM | POA: Diagnosis present

## 2020-04-12 NOTE — Therapy (Signed)
[facility] [address] [place], [place], [address] Phone: [phone]   Fax:  [phone]  Physical Therapy Evaluation  Patient Details  Name: Tracey [name] MRN: [id number] Date of Birth: Jun 29, 1975 Referring Provider (PT): Referred by Coralie Keens, MD. Followed Ihor Austin, NP   Encounter Date: 04/12/2020   PT End of Session - 04/12/20 1325    Visit Number 1    Number of Visits 13    Date for PT Re-Evaluation 07/11/20   POC for 6 weeks, Cert for 90 days   Authorization Type [employer]    PT Start Time 1230    PT Stop Time 1316    PT Time Calculation (min) 46 min    Equipment Utilized During Treatment Gait belt    Activity Tolerance Patient tolerated treatment well    Behavior During Therapy WFL for tasks assessed/performed           Past Medical History:  Diagnosis Date  . Anxiety   . Bronchitis   . Carpal tunnel syndrome on both sides   . Depression   . Family history of adverse reaction to anesthesia    mother had problems waking up from surgery.  . Gestational diabetes mellitus    Normal 2 hr GTT postpartum  . Headache    migraines  . Hypertension   . Oligohydramnios antepartum 11/09/2017  . Panic attack 2017   diagnosed 3 months ago. no meds presently  . Reflux    did not filll prescription    Past Surgical History:  Procedure Laterality Date  . BREAST BIOPSY  right    cyst  . DILATION AND EVACUATION N/A 07/26/2015   Procedure: DILATATION AND EVACUATION;  Surgeon: Brock Bad, MD;  Location: [facility];  Service: Gynecology;  Laterality: N/A;    There were no vitals filed for this visit.    Subjective Assessment - 04/12/20 1233    Subjective Patient was admitted on 04/02/20 with LLE weakness and LUE tingling. Found to have infarct in right posterior frontal lobe. Was discharged home on 04/04/20. Patietn reports that she has beem doing good since being home. Reports that she feels like  her leg wants to give out, but it has not buckled on her. No falls since being home, and is not using an AD. Patient reports she has not tried stairs at home. Patient reports gets fatigued easily.    Pertinent History HTN, depression, anxiety, Headaches, Carpal Tunnel (Bilateral)    Limitations Standing;Walking    How long can you stand comfortably? 10 minutes.    Patient Stated Goals Get Back to walking Normal; Build Endurance    Currently in Pain? No/denies              [facility] PT Assessment - 04/12/20 0001      Assessment   Medical Diagnosis LLE Weakness/Post CVA    Referring Provider (PT) Referred by Coralie Keens, MD. Followed Ihor Austin, NP    Onset Date/Surgical Date 04/02/20    Hand Dominance Right    Prior Therapy Acute Care      Precautions   Precautions Fall      Balance Screen   Has the patient fallen in the past 6 months No    Has the patient had a decrease in activity level because of a fear of falling?  No    Is the patient reluctant to leave their home because of a fear of falling?  No  Home Environment   Living Environment Private residence    Living Arrangements Spouse/significant other;Children    Available Help at Discharge Family    Type of Home House    Home Access Stairs to enter    Entrance Stairs-Number of Steps 6    Entrance Stairs-Rails Right;Left    Home Layout Two level    Alternate Level Stairs-Number of Steps 12    Alternate Level Stairs-Rails Right;Left    Home Equipment None    Additional Comments lives on main floor; increased challenge coming down stairs per patient reports.      Prior Function   Level of Independence Independent    Vocation Full time employment    [employer] (Working from Home). Currently not working, on short term disability.    Leisure Spend time with kids (2, 45, and 45 y.o.)      Cognition   Overall Cognitive Status Within Functional Limits for tasks assessed       Sensation   Light Touch Impaired by gross assessment    Additional Comments Reports dimished sensation on the lateral/posterior aspect of LLE.      Coordination   Gross Motor Movements are Fluid and Coordinated Yes    Heel Shin Test WNL      ROM / Strength   AROM / PROM / Strength AROM;Strength      AROM   Overall AROM  Within functional limits for tasks performed      Strength   Overall Strength Deficits    Strength Assessment Site Hip;Knee;Ankle    Right/Left Hip Right;Left    Right Hip Flexion 4/5    Right Hip ABduction 4/5    Right Hip ADduction 4/5    Left Hip Flexion 3+/5   increase in pain with resistance   Left Hip ABduction 3/5    Left Hip ADduction 3+/5    Right/Left Knee Right;Left    Right Knee Flexion 4+/5    Right Knee Extension 4+/5    Left Knee Flexion 3+/5    Left Knee Extension 3+/5    Right/Left Ankle Right;Left    Right Ankle Dorsiflexion 4/5    Left Ankle Dorsiflexion 3-/5      Bed Mobility   Bed Mobility --   independent per patient reports     Transfers   Transfers Sit to Stand;Stand to Sit    Sit to Stand 5: Supervision    Five time sit to stand comments  14.22 secs with UE support from standard chair    Stand to Sit 5: Supervision    Comments able to complete sit <> stand without UE support, increased weight shift to RLE noted.      Ambulation/Gait   Ambulation/Gait Yes    Ambulation/Gait Assistance 5: Supervision;4: Min guard    Ambulation/Gait Assistance Details Ambulating x 100 ft througout therapy gym without AD, Close supervision/CGA for safety. Paitent demonstrating decreased step length and heel toe pattern with ambulation. Decreased weight shift onto LLE. Patient also reports feelings of instability on LLE, no buckling noted.    Ambulation Distance (Feet) 100 Feet    Assistive device None    Gait Pattern Step-through pattern;Decreased step length - right;Decreased stance time - left;Decreased hip/knee flexion - left;Decreased  dorsiflexion - left;Decreased weight shift to left;Antalgic;Poor foot clearance - left    Ambulation Surface Level;Indoor    Gait velocity 18.72 secs = 1.75 ft/sec    Stairs Yes    Stairs Assistance 5: Supervision;4: Min guard  Stairs Assistance Details (indicate cue type and reason) complete step to pattern forwards with B rails. verbal cues for proper sequencing, as initially descending with RLE first and demo instability/imbalance    Stair Management Technique Two rails;Step to pattern;Forwards    Number of Stairs 8    Height of Stairs 6      Standardized Balance Assessment   Standardized Balance Assessment Timed Up and Go Test;Berg Balance Test      Berg Balance Test   Sit to Stand Able to stand  independently using hands    Standing Unsupported Able to stand 2 minutes with supervision    Sitting with Back Unsupported but Feet Supported on Floor or Stool Able to sit safely and securely 2 minutes    Stand to Sit Sits safely with minimal use of hands    Transfers Able to transfer safely, minor use of hands    Standing Unsupported with Eyes Closed Able to stand 10 seconds with supervision    Standing Unsupported with Feet Together Able to place feet together independently and stand 1 minute safely    From Standing, Reach Forward with Outstretched Arm Can reach forward >12 cm safely (5")    From Standing Position, Pick up Object from Floor Able to pick up shoe safely and easily    From Standing Position, Turn to Look Behind Over each Shoulder Looks behind one side only/other side shows less weight shift    Turn 360 Degrees Able to turn 360 degrees safely one side only in 4 seconds or less    Standing Unsupported, Alternately Place Feet on Step/Stool Able to complete 4 steps without aid or supervision    Standing Unsupported, One Foot in Front Able to take small step independently and hold 30 seconds    Standing on One Leg Tries to lift leg/unable to hold 3 seconds but remains standing  independently    Total Score 43    Berg comment: 43/56      Timed Up and Go Test   TUG Normal TUG    Normal TUG (seconds) 14.59    TUG Comments without AD.                      Objective measurements completed on examination: See above findings.               PT Education - 04/12/20 1229    Education Details Educated on [place]/Evaluation Findings; Start walking program 5 min/daily.    Person(s) Educated Patient    Methods Explanation    Comprehension Verbalized understanding            PT Short Term Goals - 04/12/20 1413      PT SHORT TERM GOAL #1   Title Patient will be independent with inital Strength/Balance HEP (all STGs due: 05/03/20)    Baseline no HEP established    Time 3    Period Weeks    Status New    Target Date 05/03/20      PT SHORT TERM GOAL #2   Title Patient will improve 5x sit <> stand to </= 12 seconds to demonstrate reduced fall risk    Baseline 14.22 secs    Time 3    Period Weeks    Status New      PT SHORT TERM GOAL #3   Title Patient will undergo endurance assessment (3MWT vs. 6MWT) and LTG to be written if appropriate    Baseline TBA  Time 3    Period Weeks    Status New             PT Long Term Goals - 04/12/20 1415      PT LONG TERM GOAL #1   Title Patient will be independent with Final HEP for balance/strengthening (All LTGs Due: 05/24/20)    Baseline no HEP established    Time 6    Period Weeks    Status New    Target Date 05/24/20      PT LONG TERM GOAL #2   Title Patient will improve Berg Balance to >/= 48/56 to demonstrate reduced fall risk and improved balance    Baseline 43/56    Time 6    Period Weeks    Status New      PT LONG TERM GOAL #3   Title Patient will improve gait speed to >/= 2.5 ft/sec to demonstrate improved community mobility    Baseline 1.75 ft/sec    Time 6    Period Weeks    Status New      PT LONG TERM GOAL #4   Title Patient will improve TUG to </= 12 seconds to  demonstrate reduced fall risk    Baseline 14.59 secs    Time 6    Period Weeks    Status New      PT LONG TERM GOAL #5   Title LTG to be set for 3MWT/6MWT as appropriate    Baseline TBA    Time 6    Period Weeks    Status New                  Plan - 04/12/20 1331    Clinical Impression Statement Patient is a 45 y.o. female that was referred to Neuro OPPT services for LLE weakness post CVA. Patient PMH is significant for the following: HTN, depression, anxiety, Headaches, Carpal Tunnel (Bilateral). Upon evaluation patient presents with the following impairments: impaired sensation, abnormal gait, decreased strength, decreased balance, decreased endurance, and increased risk for falls. Patient is at increased fall risk with Berg Score of 43/56, TUG time of 14.59 secs, and 5x sit <> stand of 14.22 secs. patient will benefit from skilled PT services to address impairments, maximize functional mobility, and reduce fall risk.    Personal Factors and Comorbidities Comorbidity 3+    Comorbidities HTN, depression, anxiety, Headaches, Carpal Tunnel (Bilateral)    Examination-Activity Limitations Stairs;Stand;Locomotion Level;Caring for Others    Examination-Participation Restrictions Cleaning;Occupation;Community Activity;Personal Finances    Stability/Clinical Decision Making Evolving/Moderate complexity    Clinical Decision Making Moderate    Rehab Potential Good    PT Frequency 2x / week    PT Duration 6 weeks    PT Treatment/Interventions ADLs/Self Care Home Management;Aquatic Therapy;Cryotherapy;Electrical Stimulation;Moist Heat;DME Instruction;Stair training;Gait training;Functional mobility training;Therapeutic activities;Therapeutic exercise;Balance training;Neuromuscular re-education;Patient/family education;Orthotic Fit/Training;Manual techniques;Passive range of motion;Vestibular    PT Next Visit Plan Assess 3MWT. Initiate HEP focused on strengthening (supine/seated). Potentially  try foot up brace on L foot.    Recommended Other Services Speech Therapy    Consulted and Agree with Plan of Care Patient           Patient will benefit from skilled therapeutic intervention in order to improve the following deficits and impairments:  Abnormal gait,Decreased balance,Decreased endurance,Decreased mobility,Difficulty walking,Impaired sensation,Pain,Decreased strength,Decreased activity tolerance  Visit Diagnosis: Unsteadiness on feet  Muscle weakness (generalized)  Difficulty in walking, not elsewhere classified  Other abnormalities of gait and mobility  Problem List Patient Active Problem List   Diagnosis Date Noted  . Moderate episode of recurrent major depressive disorder (HCC) 04/09/2020  . 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 Cerebral thrombosis with cerebral infarction 04/04/2020  . Hypokalemia 04/03/2020  . Left leg weakness 04/02/2020  . Hypertension   . Depression   . Anxiety 11/27/2017  . Vitamin D deficiency 06/07/2017  . History of prior pregnancy with IUGR newborn 06/04/2017  . Anxiety, generalized 02/09/2016    Tempie Donning, PT, DPT 04/12/2020, 2:17 PM  [facility] [address] [place], [place], [address] Phone: [phone]   Fax:  [phone]  Name: Perpetua Elling MRN: [id number] Date of Birth: 03-13-1976

## 2020-04-12 NOTE — Telephone Encounter (Signed)
Ihor Austin, NP,  Anja Hanf was evaluated by Physical Therapy on 04/12/2020.  The patient would benefit from Speech Therapy evaluation for changes in Attention, Recall, and Memory.  If you agree, please place an order in [facility] workque in [facility] or fax the order to [phone]. Thank you, Adelfa Koh, PT, DPT  [facility] [address] [place], [place]  [address] Phone:  [phone] Fax:  [phone]

## 2020-04-12 NOTE — Telephone Encounter (Signed)
Unfortunately, I cannot place this order until I see her in office which is not until 2/14.  It looks like she has seen her PCP since discharge and he should be able to assist with placing this order.  Please let me know if I can be of any further assistance!  Shanda Bumps, NP

## 2020-04-13 ENCOUNTER — Encounter: Payer: Self-pay | Admitting: Cardiology

## 2020-04-13 ENCOUNTER — Ambulatory Visit (INDEPENDENT_AMBULATORY_CARE_PROVIDER_SITE_OTHER): Payer: 59 | Admitting: Cardiology

## 2020-04-13 VITALS — BP 140/80 | HR 74 | Ht 67.0 in | Wt 219.0 lb

## 2020-04-13 DIAGNOSIS — R002 Palpitations: Secondary | ICD-10-CM | POA: Diagnosis not present

## 2020-04-13 DIAGNOSIS — E7800 Pure hypercholesterolemia, unspecified: Secondary | ICD-10-CM

## 2020-04-13 DIAGNOSIS — I10 Essential (primary) hypertension: Secondary | ICD-10-CM

## 2020-04-13 DIAGNOSIS — I6330 Cerebral infarction due to thrombosis of unspecified cerebral artery: Secondary | ICD-10-CM

## 2020-04-13 DIAGNOSIS — Q2112 Patent foramen ovale: Secondary | ICD-10-CM

## 2020-04-13 DIAGNOSIS — I63321 Cerebral infarction due to thrombosis of right anterior cerebral artery: Secondary | ICD-10-CM

## 2020-04-13 DIAGNOSIS — Z1231 Encounter for screening mammogram for malignant neoplasm of breast: Secondary | ICD-10-CM | POA: Insufficient documentation

## 2020-04-13 DIAGNOSIS — Q211 Atrial septal defect: Secondary | ICD-10-CM

## 2020-04-13 DIAGNOSIS — Z0001 Encounter for general adult medical examination with abnormal findings: Secondary | ICD-10-CM | POA: Insufficient documentation

## 2020-04-13 LAB — CBC
Hematocrit: 39.6 % (ref 34.0–46.6)
Hemoglobin: 13.6 g/dL (ref 11.1–15.9)
MCH: 29.6 pg (ref 26.6–33.0)
MCHC: 34.3 g/dL (ref 31.5–35.7)
MCV: 86 fL (ref 79–97)
Platelets: 274 10*3/uL (ref 150–450)
RBC: 4.59 x10E6/uL (ref 3.77–5.28)
RDW: 12.9 % (ref 11.7–15.4)
WBC: 7.4 10*3/uL (ref 3.4–10.8)

## 2020-04-13 MED ORDER — LISINOPRIL 20 MG PO TABS: 30.0000 mg | ORAL_TABLET | Freq: Every day | ORAL | 3 refills | Status: DC

## 2020-04-13 NOTE — Patient Instructions (Addendum)
Medication Instructions:  Your physician has recommended you make the following change in your medication:  1.  INCREASE the Lisinopril to 20 mg taking 1 1/2 tablets daily    *If you need a refill on your cardiac medications before your next appointment, please call your pharmacy*   Lab Work: TODAY:  CBC  If you have labs (blood work) drawn today and your tests are completely normal, you will receive your results only by: Marland Kitchen MyChart Message (if you have MyChart) OR . A paper copy in the mail If you have any lab test that is abnormal or we need to change your treatment, we will call you to review the results.   Testing/Procedures: Your physician has requested that you have a TEE. During a TEE, sound waves are used to create images of your heart. It provides your doctor with information about the size and shape of your heart and how well your heart's chambers and valves are working. In this test, a transducer is attached to the end of a flexible tube that's guided down your throat and into your esophagus (the tube leading from you mouth to your stomach) to get a more detailed image of your heart. You are not awake for the procedure. Please see the instruction sheet given to you today. For further information please visit [email or web address]   Dear Mrs. Panjwani  You are scheduled for a TEE 04/16/2020 with Dr. Duke Salvia.  Please arrive at the [facility] (Main Entrance A) at [facility]: [address] [place], [place] [address] at 7:00 am  DIET: Nothing to eat or drink after midnight except a sip of water with medications (see medication instructions below)  Medication Instructions: Continue your anticoagulant: Plavix  You will need to continue your anticoagulant after your procedure until you  are told by your  Provider that it is safe to stop   Labs: CBC will be done today   You must have a responsible person to drive you home and stay in the waiting area during your procedure.  Failure to do so could result in cancellation.  Bring your insurance cards.  *Special Note: Every effort is made to have your procedure done on time. Occasionally there are emergencies that occur at the hospital that may cause delays. Please be patient if a delay does occur.     Your physician has recommended that you wear an event monitor. Event monitors are medical devices that record the heart's electrical activity. Doctors most often [place] these monitors to diagnose arrhythmias. Arrhythmias are problems with the speed or rhythm of the heartbeat. The monitor is a small, portable device. You can wear one while you do your normal daily activities. This is usually used to diagnose what is causing palpitations/syncope (passing out). See instructions:  [employer] Cardiac Event Monitor Instructions Your physician has requested you wear your cardiac event monitor for 30 days, (1-30). [employer] may call or text to confirm a shipping address. The monitor will be sent to a land address via UPS. [employer] will not ship a monitor to a PO BOX. It typically takes 3-5 days to receive your monitor after it has been enrolled. [employer] will assist with USPS tracking if your package is delayed. The telephone number for [employer] is [phone]. Once you have received your monitor, please review the enclosed instructions. Instruction tutorials can also be viewed under help and settings on the enclosed cell phone. Your monitor has already been registered assigning a specific monitor serial # to you.  Applying the monitor Remove cell phone from case and turn it on. The cell phone works as IT consultant and needs to be within [employer] of you at all times. The cell phone will need to be charged on a daily basis. We recommend you plug the cell phone into the enclosed charger at your bedside table every night.  Monitor batteries: You will receive two monitor batteries labelled #1 and #2. These are your  recorders. Plug battery #2 onto the second connection on the enclosed charger. Keep one battery on the charger at all times. This will keep the monitor battery deactivated. It will also keep it fully charged for when you need to switch your monitor batteries. A small light will be blinking on the battery emblem when it is charging. The light on the battery emblem will remain on when the battery is fully charged.  Open package of a Monitor strip. Insert battery #1 into black hood on strip and gently squeeze monitor battery onto connection as indicated in instruction booklet. Set aside while preparing skin.  Choose location for your strip, vertical or horizontal, as indicated in the instruction booklet. Shave to remove all hair from location. There cannot be any lotions, oils, powders, or colognes on skin where monitor is to be applied. Wipe skin clean with enclosed Saline wipe. Dry skin completely.  Peel paper labeled #1 off the back of the Monitor strip exposing the adhesive. Place the monitor on the chest in the vertical or horizontal position shown in the instruction booklet. One arrow on the monitor strip must be pointing upward. Carefully remove paper labeled #2, attaching remainder of strip to your skin. Try not to create any folds or wrinkles in the strip as you apply it.  Firmly press and release the circle in the center of the monitor battery. You will hear a small beep. This is turning the monitor battery on. The heart emblem on the monitor battery will light up every 5 seconds if the monitor battery in turned on and connected to the patient securely. Do not push and hold the circle down as this turns the monitor battery off. The cell phone will locate the monitor battery. A screen will appear on the cell phone checking the connection of your monitor strip. This may read poor connection initially but change to good connection within the next minute. Once your monitor accepts the  connection you will hear a series of 3 beeps followed by a climbing crescendo of beeps. A screen will appear on the cell phone showing the two monitor strip placement options. Touch the picture that demonstrates where you applied the monitor strip.  Your monitor strip and battery are waterproof. You are able to shower, bathe, or swim with the monitor on. They just ask you do not submerge deeper than 3 feet underwater. We recommend removing the monitor if you are swimming in a lake, river, or ocean.  Your monitor battery will need to be switched to a fully charged monitor battery approximately once a week. The cell phone will alert you of an action which needs to be made.  On the cell phone, tap for details to reveal connection status, monitor battery status, and cell phone battery status. The green dots indicates your monitor is in good status. A red dot indicates there is something that needs your attention.  To record a symptom, click the circle on the monitor battery. In 30-60 seconds a list of symptoms will appear on the cell phone.  Select your symptom and tap save. Your monitor will record a sustained or significant arrhythmia regardless of you clicking the button. Some patients do not feel the heart rhythm irregularities. [employer] will notify us of any serious or critical events.  Refer to instruction booklet for instructions on switching batteries, changing strips, the Do not disturb or Pause features, or any additional questions.  Call [employer] at [phone], to confirm your monitor is transmitting and record your baseline. They will answer any questions you may have regarding the monitor instructions at that time.  Returning the monitor to [employer] Place all equipment back into blue box. Peel off strip of paper to expose adhesive and close box securely. There is a prepaid UPS shipping label on this box. Drop in a UPS drop box, or at a UPS facility like [employer]. You  may also contact [employer] to arrange UPS to pick up monitor package at your home.    Follow-Up: At [facility], you and your health needs are our priority.  As part of our continuing mission to provide you with exceptional heart care, we have created designated Provider Care Teams.  These Care Teams include your primary Cardiologist (physician) and Advanced Practice Providers (APPs -  Physician Assistants and Nurse Practitioners) who all work together to provide you with the care you need, when you need it.  We recommend signing up for the patient portal called "MyChart".  Sign up information is provided on this After Visit Summary.  MyChart is used to connect with patients for Virtual Visits (Telemedicine).  Patients are able to view lab/test results, encounter notes, upcoming appointments, etc.  Non-urgent messages can be sent to your provider as well.   To learn more about what you can do with MyChart, go to [email or web address].    Your next appointment:   07/09/2020 ARRIVE AT 1:00  The format for your next appointment:   In Person  Provider:   Laurance Flatten, MD   Other Instructions    Due to recent COVID-19 restrictions implemented by our local and state authorities and in an effort to keep both patients and staff as safe as possible, our hospital system requires COVID-19 testing prior to certain scheduled hospital procedures.  Please go to [address] [place], [place] [address] on 04/14/2020 1:30 pm  .  This is a drive up testing site.  You will not need to exit your vehicle.  You will not be billed at the time of testing but may receive a bill later depending on your insurance. You must agree to self-quarantine from the time of your testing until the procedure date on 04/16/2020.  This should included staying home with ONLY the people you live with.  Avoid take-out, grocery store shopping or leaving the house for any non-emergent reason.  Failure to have your COVID-19 test  done on the date and time you have been scheduled will result in cancellation of your procedure.  Please call our office at [phone] if you have any questions.

## 2020-04-13 NOTE — H&P (View-Only) (Signed)
Cardiology Office Note:    Date:  04/13/2020   ID:  Tracey [name], DOB 05/05/1975, MRN [id number]  PCP:  Jones, Thomas L, MD  [facility] Cardiologist:  No primary care provider on file.  [facility] Electrophysiologist:  None   Referring MD: Jones, Thomas L, MD     History of Present Illness:    Tracey [name] is a 44 y.o. female with a hx of anxiety, depression, HTN, tobacco abuse, CVA and gestational diabetes who was referred by Dr. Jones for further evaluation of her recent stroke.  The patient was hospitalized from 01/14-01/16/22 for acute onset neck discomfort, left arm and leg numbness and associated weakness. Head CT without acute changes but MRI brain showed small acute infarct in the juxtacortical high right posterior frontal lobe. CTA head and neck normal. TTE with normal EF, no significant valve disease. Transcranial doppler was positive for Spencer grade 4 PFO with valsalva. Patient was managed medically with DAPT (ASA/plavix) for 3 weeks then ASA alone as well as crestor. She also had hypercoaguable work-up, which was unremarkable. She was counseled to follow-up with Cardiology to schedule TEE.  Today, the patient states that she feels overall well and just wants to return to normal life. No chest pain but has occasional palpitations. She initially attributed her palpitations to anxiety as they often correlate, but notes that they worse prior to her stroke. No LE swelling, orthopnea, or PND. She snores and has some daytime fatigue. Blood pressure at home 130s/80s. No bleeding issues. Previous smoker but has stopped since her stroke.   Transcranial doppler 04/04/20: Summary:  A vascular evaluation was performed. The right middle cerebral artery was  studied. An IV was inserted into the patient's right forearm . Verbal  informed consent was obtained.    HITS heard at rest: none  HITS heard during valsalva: Positive for Spencer grade 4 (partail curtain)  PFO.  TTE  04/03/20: IMPRESSIONS  1. Left ventricular ejection fraction, by estimation, is 55 to 60%. The  left ventricle has normal function. The left ventricle has no regional  wall motion abnormalities. Left ventricular diastolic parameters were  normal.  2. Right ventricular systolic function is normal. The right ventricular  size is normal.  3. Left atrial size was mildly dilated.  4. The mitral valve is normal in structure. Trivial mitral valve  regurgitation. No evidence of mitral stenosis.  5. The aortic valve is normal in structure. Aortic valve regurgitation is  not visualized. No aortic stenosis is present.  6. The inferior vena cava is normal in size with greater than 50%  respiratory variability, suggesting right atrial pressure of 3 mmHg.  Past Medical History:  Diagnosis Date  . Anxiety   . Bronchitis   . Carpal tunnel syndrome on both sides   . Depression   . Family history of adverse reaction to anesthesia    mother had problems waking up from surgery.  . Gestational diabetes mellitus    Normal 2 hr GTT postpartum  . Headache    migraines  . Hypertension   . Oligohydramnios antepartum 11/09/2017  . Panic attack 2017   diagnosed 3 months ago. no meds presently  . Reflux    did not filll prescription    Past Surgical History:  Procedure Laterality Date  . BREAST BIOPSY  right    cyst  . DILATION AND EVACUATION N/A 07/26/2015   Procedure: DILATATION AND EVACUATION;  Surgeon: Charles A Harper, MD;  Location: [facility];  Service: Gynecology;    Laterality: N/A;    Current Medications: Current Meds  Medication Sig  . aspirin 81 MG EC tablet Take 1 tablet (81 mg total) by mouth daily. Swallow whole.  . buPROPion (WELLBUTRIN) 75 MG tablet Take 75 mg by mouth 2 (two) times daily.  . clopidogrel (PLAVIX) 75 MG tablet Take 1 tablet (75 mg total) by mouth daily for 21 days.  . COD LIVER OIL PO Take 1 capsule by mouth daily.  . hydrOXYzine (VISTARIL) 25 MG capsule Take 25 mg  by mouth daily as needed.  . lisinopril (ZESTRIL) 20 MG tablet Take 1.5 tablets (30 mg total) by mouth daily.  . Multiple Vitamins-Minerals (MULTIVITAMIN WITH MINERALS) tablet Take 1 tablet by mouth daily.  . Probiotic Product (PROBIOTIC DAILY PO) Take 1 tablet by mouth daily.   . rosuvastatin (CRESTOR) 10 MG tablet Take 1 tablet (10 mg total) by mouth daily.  . Vilazodone HCl (VIIBRYD STARTER PACK) 10 & 20 MG KIT Take 1 tablet by mouth daily.  . [DISCONTINUED] lisinopril (PRINIVIL,ZESTRIL) 20 MG tablet Take 20 mg by mouth daily.     Allergies:   Patient has no known allergies.   Social History   Socioeconomic History  . Marital status: Married    Spouse name: Not on file  . Number of children: Not on file  . Years of education: Not on file  . Highest education level: Not on file  Occupational History  . Not on file  Tobacco Use  . Smoking status: Current Some Day Smoker    Packs/day: 0.25    Types: Cigarettes  . Smokeless tobacco: Never Used  . Tobacco comment: 2 cig/day  Vaping Use  . Vaping Use: Never used  Substance and Sexual Activity  . Alcohol use: No    Alcohol/week: 0.0 standard drinks  . Drug use: No  . Sexual activity: Not Currently  Other Topics Concern  . Not on file  Social History Narrative  . Not on file   Social Determinants of Health   Financial Resource Strain: Not on file  Food Insecurity: Not on file  Transportation Needs: Not on file  Physical Activity: Not on file  Stress: Not on file  Social Connections: Not on file     Family History: The patient's family history includes Diabetes in her maternal aunt and maternal uncle; Hypertension in her mother.  ROS:   Please see the history of present illness.    Review of Systems  Constitutional: Negative for chills and fever.  HENT: Negative for congestion.   Eyes: Negative for blurred vision.  Respiratory: Negative for shortness of breath.   Cardiovascular: Positive for palpitations. Negative  for chest pain, orthopnea, claudication, leg swelling and PND.  Gastrointestinal: Negative for melena, nausea and vomiting.  Genitourinary: Negative for hematuria.  Musculoskeletal: Negative for falls and myalgias.  Neurological: Positive for weakness. Negative for loss of consciousness.  Psychiatric/Behavioral: The patient is nervous/anxious.     EKGs/Labs/Other Studies Reviewed:    The following studies were reviewed today: TTE 04/03/20: IMPRESSIONS  1. Left ventricular ejection fraction, by estimation, is 55 to 60%. The  left ventricle has normal function. The left ventricle has no regional  wall motion abnormalities. Left ventricular diastolic parameters were  normal.  2. Right ventricular systolic function is normal. The right ventricular  size is normal.  3. Left atrial size was mildly dilated.  4. The mitral valve is normal in structure. Trivial mitral valve  regurgitation. No evidence of mitral stenosis.  5. The   aortic valve is normal in structure. Aortic valve regurgitation is  not visualized. No aortic stenosis is present.  6. The inferior vena cava is normal in size with greater than 50%  respiratory variability, suggesting right atrial pressure of 3 mmHg.  LE doppler ultrasound 04/04/20: Summary:  BILATERAL:  - No evidence of deep vein thrombosis seen in the lower extremities,  bilaterally.  - No evidence of superficial venous thrombosis in the lower extremities,  bilaterally.  -No evidence of popliteal cyst, bilaterally.   Transcranial doppler 04/04/20: Summary:  A vascular evaluation was performed. The right middle cerebral artery was  studied. An IV was inserted into the patient's right forearm . Verbal  informed consent was obtained.    HITS heard at rest: none  HITS heard during valsalva: Positive for Spencer grade 4 (partail curtain)  PFO.  CT Angio Head W or Wo Contrast CT Angio Neck W and/or Wo Contrast 04/02/2020 IMPRESSION:  Normal CTA of  the head and neck.   CT HEAD CODE STROKE WO CONTRAST 04/02/2020 IMPRESSION:  1. Normal head CT.  2. ASPECTS is 10.   MR BRAIN W WO CONTRAST 04/03/2020 IMPRESSION:  1. 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  2. Additional mild scattered T2/FLAIR hyperintensities within the white matter, mildly advanced for age. These are nonspecific and could relate to chronic microvascular ischemic disease, prior migraines, trauma, demyelination, or inflammation.   EKG:  EKG 04/02/20: NSR. No ischemia or block.  Recent Labs: 04/03/2020: ALT 19; Hemoglobin 12.3; Platelets 249 04/09/2020: BUN 12; Creatinine, Ser 1.07; Magnesium 1.9; Potassium 3.9; Sodium 137; TSH 2.93  Recent Lipid Panel    Component Value Date/Time   CHOL 149 04/03/2020 0343   TRIG 55 04/03/2020 0343   HDL 37 (L) 04/03/2020 0343   CHOLHDL 4.0 04/03/2020 0343   VLDL 11 04/03/2020 0343   LDLCALC 101 (H) 04/03/2020 0343     Physical Exam:    VS:  BP 140/80   Pulse 74   Ht 5' 7" (1.702 m)   Wt 219 lb (99.3 kg)   SpO2 98%   BMI 34.30 kg/m     Wt Readings from Last 3 Encounters:  04/13/20 219 lb (99.3 kg)  04/09/20 213 lb (96.6 kg)  04/02/20 215 lb (97.5 kg)     GEN:  Well nourished, well developed in no acute distress HEENT: Normal NECK: No JVD; No carotid bruits CARDIAC: RRR, no murmurs, rubs, gallops RESPIRATORY:  Clear to auscultation without rales, wheezing or rhonchi  ABDOMEN: Soft, non-tender, non-distended MUSCULOSKELETAL:  No edema; No deformity  SKIN: Warm and dry NEUROLOGIC:  Alert and oriented x 3 PSYCHIATRIC:  Normal affect   ASSESSMENT:    1. Cerebral thrombosis with cerebral infarction   2. Cerebrovascular accident (CVA) due to thrombosis of right anterior cerebral artery (HCC)   3. Palpitations   4. Primary hypertension   5. Pure hypercholesterolemia   6. Patent foramen ovale    PLAN:    In order of problems listed  above:  #Recent CVA: #PFO on transcranial doppler: MRI with small acute infarct in the juxtacortical high right posterior frontal lobe. CTA head and neck unremakable. TTE with normal EF, no significant valvular pathology. Hypercoaguable work-up negative. Transcranial doppler positive for spencer grade 4 PFO. Patient has been maintained on ASA/plavix and crestor. Needs TEE to complete evaluation. -Plan for TEE  -Transcranial doppler positive for Spencer grade 4 PFO with valsalva -Once TEE performed, will   refer to Dr. Cooper -Continue ASA and plavix -Continue crestor -Will check 30day monitor as well to assess for Afib given history of palpitations -Follow-up with Neuro as scheduled  #HTN: Blood pressure above goal mainly in 130-140s at home. -Increase lisinopril to 30mg daily  #HLD: -Continue crestor  #Snoring: -Once completed work-up for stroke and PFO, will plan on sleep study to assess for OSA  Shared Decision Making/Informed Consent The risks [esophageal damage, perforation (1:10,000 risk), bleeding, pharyngeal hematoma as well as other potential complications associated with conscious sedation including aspiration, arrhythmia, respiratory failure and death], benefits (treatment guidance and diagnostic support) and alternatives of a transesophageal echocardiogram were discussed in detail with Tracey [name] and she is willing to proceed.    Medication Adjustments/Labs and Tests Ordered: Current medicines are reviewed at length with the patient today.  Concerns regarding medicines are outlined above.  Orders Placed This Encounter  Procedures  . CBC  . Cardiac event monitor   Meds ordered this encounter  Medications  . lisinopril (ZESTRIL) 20 MG tablet    Sig: Take 1.5 tablets (30 mg total) by mouth daily.    Dispense:  135 tablet    Refill:  3    Patient Instructions  Medication Instructions:  Your physician has recommended you make the following change in your medication:   1.  INCREASE the Lisinopril to 20 mg taking 1 1/2 tablets daily    *If you need a refill on your cardiac medications before your next appointment, please call your pharmacy*   Lab Work: TODAY:  CBC  If you have labs (blood work) drawn today and your tests are completely normal, you will receive your results only by: . MyChart Message (if you have MyChart) OR . A paper copy in the mail If you have any lab test that is abnormal or we need to change your treatment, we will call you to review the results.   Testing/Procedures: Your physician has requested that you have a TEE. During a TEE, sound waves are used to create images of your heart. It provides your doctor with information about the size and shape of your heart and how well your heart's chambers and valves are working. In this test, a transducer is attached to the end of a flexible tube that's guided down your throat and into your esophagus (the tube leading from you mouth to your stomach) to get a more detailed image of your heart. You are not awake for the procedure. Please see the instruction sheet given to you today. For further information please visit [email or web address].   Dear Tracey [name]  You are scheduled for a TEE 04/16/2020 with Dr. [place].  Please arrive at the [facility] (Main Entrance A) at  Hospital: [address] [place], [place] [address] at 7:00 am  DIET: Nothing to eat or drink after midnight except a sip of water with medications (see medication instructions below)  Medication Instructions: Continue your anticoagulant: Plavix  You will need to continue your anticoagulant after your procedure until you  are told by your  Provider that it is safe to stop   Labs: CBC will be done today   You must have a responsible person to drive you home and stay in the waiting area during your procedure. Failure to do so could result in cancellation.  Bring your insurance cards.  *Special Note: Every  effort is made to have your procedure done on time. Occasionally there are emergencies that occur at the hospital   that may cause delays. Please be patient if a delay does occur.     Your physician has recommended that you wear an event monitor. Event monitors are medical devices that record the heart's electrical activity. Doctors most often [place] these monitors to diagnose arrhythmias. Arrhythmias are problems with the speed or rhythm of the heartbeat. The monitor is a small, portable device. You can wear one while you do your normal daily activities. This is usually used to diagnose what is causing palpitations/syncope (passing out). See instructions:  [employer] Cardiac Event Monitor Instructions Your physician has requested you wear your cardiac event monitor for 30 days, (1-30). [employer] may call or text to confirm a shipping address. The monitor will be sent to a land address via UPS. [employer] will not ship a monitor to a PO BOX. It typically takes 3-5 days to receive your monitor after it has been enrolled. [employer] will assist with USPS tracking if your package is delayed. The telephone number for [employer] is [phone]. Once you have received your monitor, please review the enclosed instructions. Instruction tutorials can also be viewed under help and settings on the enclosed cell phone. Your monitor has already been registered assigning a specific monitor serial # to you.  Applying the monitor Remove cell phone from case and turn it on. The cell phone works as Dealer and needs to be within [employer] of you at all times. The cell phone will need to be charged on a daily basis. We recommend you plug the cell phone into the enclosed charger at your bedside table every night.  Monitor batteries: You will receive two monitor batteries labelled #1 and #2. These are your recorders. Plug battery #2 onto the second connection on the enclosed charger. Keep one battery on the  charger at all times. This will keep the monitor battery deactivated. It will also keep it fully charged for when you need to switch your monitor batteries. A small light will be blinking on the battery emblem when it is charging. The light on the battery emblem will remain on when the battery is fully charged.  Open package of a Monitor strip. Insert battery #1 into black hood on strip and gently squeeze monitor battery onto connection as indicated in instruction booklet. Set aside while preparing skin.  Choose location for your strip, vertical or horizontal, as indicated in the instruction booklet. Shave to remove all hair from location. There cannot be any lotions, oils, powders, or colognes on skin where monitor is to be applied. Wipe skin clean with enclosed Saline wipe. Dry skin completely.  Peel paper labeled #1 off the back of the Monitor strip exposing the adhesive. Place the monitor on the chest in the vertical or horizontal position shown in the instruction booklet. One arrow on the monitor strip must be pointing upward. Carefully remove paper labeled #2, attaching remainder of strip to your skin. Try not to create any folds or wrinkles in the strip as you apply it.  Firmly press and release the circle in the center of the monitor battery. You will hear a small beep. This is turning the monitor battery on. The heart emblem on the monitor battery will light up every 5 seconds if the monitor battery in turned on and connected to the patient securely. Do not push and hold the circle down as this turns the monitor battery off. The cell phone will locate the monitor battery. A screen will appear on the cell phone checking the connection  of your monitor strip. This may read poor connection initially but change to good connection within the next minute. Once your monitor accepts the connection you will hear a series of 3 beeps followed by a climbing crescendo of beeps. A screen will appear  on the cell phone showing the two monitor strip placement options. Touch the picture that demonstrates where you applied the monitor strip.  Your monitor strip and battery are waterproof. You are able to shower, bathe, or swim with the monitor on. They just ask you do not submerge deeper than 3 feet underwater. We recommend removing the monitor if you are swimming in a lake, river, or ocean.  Your monitor battery will need to be switched to a fully charged monitor battery approximately once a week. The cell phone will alert you of an action which needs to be made.  On the cell phone, tap for details to reveal connection status, monitor battery status, and cell phone battery status. The green dots indicates your monitor is in good status. A red dot indicates there is something that needs your attention.  To record a symptom, click the circle on the monitor battery. In 30-60 seconds a list of symptoms will appear on the cell phone. Select your symptom and tap save. Your monitor will record a sustained or significant arrhythmia regardless of you clicking the button. Some patients do not feel the heart rhythm irregularities. [employer] will notify us of any serious or critical events.  Refer to instruction booklet for instructions on switching batteries, changing strips, the Do not disturb or Pause features, or any additional questions.  Call [employer] at [phone], to confirm your monitor is transmitting and record your baseline. They will answer any questions you may have regarding the monitor instructions at that time.  Returning the monitor to [employer] Place all equipment back into blue box. Peel off strip of paper to expose adhesive and close box securely. There is a prepaid UPS shipping label on this box. Drop in a UPS drop box, or at a UPS facility like [employer]. You may also contact [employer] to arrange UPS to pick up monitor package at your home.    Follow-Up: At [facility], you and your health needs are our priority.  As part of our continuing mission to provide you with exceptional heart care, we have created designated Provider Care Teams.  These Care Teams include your primary Cardiologist (physician) and Advanced Practice Providers (APPs -  Physician Assistants and Nurse Practitioners) who all work together to provide you with the care you need, when you need it.  We recommend signing up for the patient portal called "MyChart".  Sign up information is provided on this After Visit Summary.  MyChart is used to connect with patients for Virtual Visits (Telemedicine).  Patients are able to view lab/test results, encounter notes, upcoming appointments, etc.  Non-urgent messages can be sent to your provider as well.   To learn more about what you can do with MyChart, go to [email or web address].    Your next appointment:   07/09/2020 ARRIVE AT 1:00  The format for your next appointment:   In Person  Provider:   Deon Ivey, MD   Other Instructions      Signed, Saleemah Mollenhauer E Lenor Provencher, MD  04/13/2020 2:29 PM    [facility] 

## 2020-04-13 NOTE — Progress Notes (Signed)
Cardiology Office Note:    Date:  04/13/2020   ID:  Tracey [name], DOB Oct 26, 1975, MRN [id number]  PCP:  Janith Lima, MD  [place] Cardiologist:  No primary care provider on file.  [facility] Electrophysiologist:  None   Referring MD: Janith Lima, MD     History of Present Illness:    Tracey [name] is a 45 y.o. female with a hx of anxiety, depression, HTN, tobacco abuse, CVA and gestational diabetes who was referred by Dr. Ronnald Ramp for further evaluation of her recent stroke.  The patient was hospitalized from 01/14-01/16/22 for acute onset neck discomfort, left arm and leg numbness and associated weakness. Head CT without acute changes but MRI brain showed small acute infarct in the juxtacortical high right posterior frontal lobe. CTA head and neck normal. TTE with normal EF, no significant valve disease. Transcranial doppler was positive for Spencer grade 4 PFO with valsalva. Patient was managed medically with DAPT (ASA/plavix) for 3 weeks then ASA alone as well as crestor. She also had hypercoaguable work-up, which was unremarkable. She was counseled to follow-up with Cardiology to schedule TEE.  Today, the patient states that she feels overall well and just wants to return to normal life. No chest pain but has occasional palpitations. She initially attributed her palpitations to anxiety as they often correlate, but notes that they worse prior to her stroke. No LE swelling, orthopnea, or PND. She snores and has some daytime fatigue. Blood pressure at home 130s/80s. No bleeding issues. Previous smoker but has stopped since her stroke.   Transcranial doppler 04/04/20: Summary:  A vascular evaluation was performed. The right middle cerebral artery was  studied. An IV was inserted into the patient's right forearm . Verbal  informed consent was obtained.    HITS heard at rest: none  HITS heard during valsalva: Positive for Spencer grade 4 (partail curtain)  PFO.  TTE  04/03/20: IMPRESSIONS  1. Left ventricular ejection fraction, by estimation, is 55 to 60%. The  left ventricle has normal function. The left ventricle has no regional  wall motion abnormalities. Left ventricular diastolic parameters were  normal.  2. Right ventricular systolic function is normal. The right ventricular  size is normal.  3. Left atrial size was mildly dilated.  4. The mitral valve is normal in structure. Trivial mitral valve  regurgitation. No evidence of mitral stenosis.  5. The aortic valve is normal in structure. Aortic valve regurgitation is  not visualized. No aortic stenosis is present.  6. The inferior vena cava is normal in size with greater than 50%  respiratory variability, suggesting right atrial pressure of 3 mmHg.  Past Medical History:  Diagnosis Date  . Anxiety   . Bronchitis   . Carpal tunnel syndrome on both sides   . Depression   . Family history of adverse reaction to anesthesia    mother had problems waking up from surgery.  . Gestational diabetes mellitus    Normal 2 hr GTT postpartum  . Headache    migraines  . Hypertension   . Oligohydramnios antepartum 11/09/2017  . Panic attack 2017   diagnosed 3 months ago. no meds presently  . Reflux    did not filll prescription    Past Surgical History:  Procedure Laterality Date  . BREAST BIOPSY  right    cyst  . DILATION AND EVACUATION N/A 07/26/2015   Procedure: DILATATION AND EVACUATION;  Surgeon: Shelly Bombard, MD;  Location: [facility];  Service: Gynecology;  Laterality: N/A;    Current Medications: Current Meds  Medication Sig  . aspirin 81 MG EC tablet Take 1 tablet (81 mg total) by mouth daily. Swallow whole.  Marland Kitchen buPROPion (WELLBUTRIN) 75 MG tablet Take 75 mg by mouth 2 (two) times daily.  . clopidogrel (PLAVIX) 75 MG tablet Take 1 tablet (75 mg total) by mouth daily for 21 days.  . COD LIVER OIL PO Take 1 capsule by mouth daily.  . hydrOXYzine (VISTARIL) 25 MG capsule Take 25 mg  by mouth daily as needed.  Marland Kitchen lisinopril (ZESTRIL) 20 MG tablet Take 1.5 tablets (30 mg total) by mouth daily.  . Multiple Vitamins-Minerals (MULTIVITAMIN WITH MINERALS) tablet Take 1 tablet by mouth daily.  . Probiotic Product (PROBIOTIC DAILY PO) Take 1 tablet by mouth daily.   . rosuvastatin (CRESTOR) 10 MG tablet Take 1 tablet (10 mg total) by mouth daily.  . Vilazodone HCl (VIIBRYD STARTER PACK) 10 & 20 MG KIT Take 1 tablet by mouth daily.  . [DISCONTINUED] lisinopril (PRINIVIL,ZESTRIL) 20 MG tablet Take 20 mg by mouth daily.     Allergies:   Patient has no known allergies.   Social History   Socioeconomic History  . Marital status: Married    Spouse name: Not on file  . Number of children: Not on file  . Years of education: Not on file  . Highest education level: Not on file  Occupational History  . Not on file  Tobacco Use  . Smoking status: Current Some Day Smoker    Packs/day: 0.25    Types: Cigarettes  . Smokeless tobacco: Never Used  . Tobacco comment: 2 cig/day  Vaping Use  . Vaping Use: Never used  Substance and Sexual Activity  . Alcohol use: No    Alcohol/week: 0.0 standard drinks  . Drug use: No  . Sexual activity: Not Currently  Other Topics Concern  . Not on file  Social History Narrative  . Not on file   Social Determinants of Health   Financial Resource Strain: Not on file  Food Insecurity: Not on file  Transportation Needs: Not on file  Physical Activity: Not on file  Stress: Not on file  Social Connections: Not on file     Family History: The patient's family history includes Diabetes in her maternal aunt and maternal uncle; Hypertension in her mother.  ROS:   Please see the history of present illness.    Review of Systems  Constitutional: Negative for chills and fever.  HENT: Negative for congestion.   Eyes: Negative for blurred vision.  Respiratory: Negative for shortness of breath.   Cardiovascular: Positive for palpitations. Negative  for chest pain, orthopnea, claudication, leg swelling and PND.  Gastrointestinal: Negative for melena, nausea and vomiting.  Genitourinary: Negative for hematuria.  Musculoskeletal: Negative for falls and myalgias.  Neurological: Positive for weakness. Negative for loss of consciousness.  Psychiatric/Behavioral: The patient is nervous/anxious.     EKGs/Labs/Other Studies Reviewed:    The following studies were reviewed today: TTE Apr 08, 2020: IMPRESSIONS  1. Left ventricular ejection fraction, by estimation, is 55 to 60%. The  left ventricle has normal function. The left ventricle has no regional  wall motion abnormalities. Left ventricular diastolic parameters were  normal.  2. Right ventricular systolic function is normal. The right ventricular  size is normal.  3. Left atrial size was mildly dilated.  4. The mitral valve is normal in structure. Trivial mitral valve  regurgitation. No evidence of mitral stenosis.  5. The  aortic valve is normal in structure. Aortic valve regurgitation is  not visualized. No aortic stenosis is present.  6. The inferior vena cava is normal in size with greater than 50%  respiratory variability, suggesting right atrial pressure of 3 mmHg.  LE doppler ultrasound 04/04/20: Summary:  BILATERAL:  - No evidence of deep vein thrombosis seen in the lower extremities,  bilaterally.  - No evidence of superficial venous thrombosis in the lower extremities,  bilaterally.  -No evidence of popliteal cyst, bilaterally.   Transcranial doppler 04/04/20: Summary:  A vascular evaluation was performed. The right middle cerebral artery was  studied. An IV was inserted into the patient's right forearm . Verbal  informed consent was obtained.    HITS heard at rest: none  HITS heard during valsalva: Positive for Spencer grade 4 (partail curtain)  PFO.  CT Angio Head W or Wo Contrast CT Angio Neck W and/or Wo Contrast 04/02/2020 IMPRESSION:  Normal CTA of  the head and neck.   CT HEAD CODE STROKE WO CONTRAST 04/02/2020 IMPRESSION:  1. Normal head CT.  2. ASPECTS is 10.   MR BRAIN W WO CONTRAST 04/03/2020 IMPRESSION:  1. 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  2. Additional mild scattered T2/FLAIR hyperintensities within the white matter, mildly advanced for age. These are nonspecific and could relate to chronic microvascular ischemic disease, prior migraines, trauma, demyelination, or inflammation.   EKG:  EKG 04/02/20: NSR. No ischemia or block.  Recent Labs: 04/03/2020: ALT 19; Hemoglobin 12.3; Platelets 249 04/09/2020: BUN 12; Creatinine, Ser 1.07; Magnesium 1.9; Potassium 3.9; Sodium 137; TSH 2.93  Recent Lipid Panel    Component Value Date/Time   CHOL 149 04/03/2020 0343   TRIG 55 04/03/2020 0343   HDL 37 (L) 04/03/2020 0343   CHOLHDL 4.0 04/03/2020 0343   VLDL 11 04/03/2020 0343   LDLCALC 101 (H) 04/03/2020 0343     Physical Exam:    VS:  BP 140/80   Pulse 74   Ht 5' 7" (1.702 m)   Wt 219 lb (99.3 kg)   SpO2 98%   BMI 34.30 kg/m     Wt Readings from Last 3 Encounters:  04/13/20 219 lb (99.3 kg)  04/09/20 213 lb (96.6 kg)  04/02/20 215 lb (97.5 kg)     GEN:  Well nourished, well developed in no acute distress HEENT: Normal NECK: No JVD; No carotid bruits CARDIAC: RRR, no murmurs, rubs, gallops RESPIRATORY:  Clear to auscultation without rales, wheezing or rhonchi  ABDOMEN: Soft, non-tender, non-distended MUSCULOSKELETAL:  No edema; No deformity  SKIN: Warm and dry NEUROLOGIC:  Alert and oriented x 3 PSYCHIATRIC:  Normal affect   ASSESSMENT:    1. Cerebral thrombosis with cerebral infarction   2. Cerebrovascular accident (CVA) due to thrombosis of right anterior cerebral artery (HCC)   3. Palpitations   4. Primary hypertension   5. Pure hypercholesterolemia   6. Patent foramen ovale    PLAN:    In order of problems listed  above:  #Recent CVA: #PFO on transcranial doppler: MRI with small acute infarct in the juxtacortical high right posterior frontal lobe. CTA head and neck unremakable. TTE with normal EF, no significant valvular pathology. Hypercoaguable work-up negative. Transcranial doppler positive for spencer grade 4 PFO. Patient has been maintained on ASA/plavix and crestor. Needs TEE to complete evaluation. -Plan for TEE  -Transcranial doppler positive for Spencer grade 4 PFO with valsalva -Once TEE performed, will  refer to Dr. Burt Knack -Continue ASA and plavix -Continue crestor -Will check 30day monitor as well to assess for Afib given history of palpitations -Follow-up with Neuro as scheduled  #HTN: Blood pressure above goal mainly in 130-140s at home. -Increase lisinopril to 62m daily  #HLD: -Continue crestor  #Snoring: -Once completed work-up for stroke and PFO, will plan on sleep study to assess for OSA  Shared Decision Making/Informed Consent The risks [esophageal damage, perforation (1:10,000 risk), bleeding, pharyngeal hematoma as well as other potential complications associated with conscious sedation including aspiration, arrhythmia, respiratory failure and death], benefits (treatment guidance and diagnostic support) and alternatives of a transesophageal echocardiogram were discussed in detail with Ms. Daquila and she is willing to proceed.    Medication Adjustments/Labs and Tests Ordered: Current medicines are reviewed at length with the patient today.  Concerns regarding medicines are outlined above.  Orders Placed This Encounter  Procedures  . CBC  . Cardiac event monitor   Meds ordered this encounter  Medications  . lisinopril (ZESTRIL) 20 MG tablet    Sig: Take 1.5 tablets (30 mg total) by mouth daily.    Dispense:  135 tablet    Refill:  3    Patient Instructions  Medication Instructions:  Your physician has recommended you make the following change in your medication:   1.  INCREASE the Lisinopril to 20 mg taking 1 1/2 tablets daily    *If you need a refill on your cardiac medications before your next appointment, please call your pharmacy*   Lab Work: TODAY:  CBC  If you have labs (blood work) drawn today and your tests are completely normal, you will receive your results only by: .Marland KitchenMyChart Message (if you have MyChart) OR . A paper copy in the mail If you have any lab test that is abnormal or we need to change your treatment, we will call you to review the results.   Testing/Procedures: Your physician has requested that you have a TEE. During a TEE, sound waves are used to create images of your heart. It provides your doctor with information about the size and shape of your heart and how well your heart's chambers and valves are working. In this test, a transducer is attached to the end of a flexible tube that's guided down your throat and into your esophagus (the tube leading from you mouth to your stomach) to get a more detailed image of your heart. You are not awake for the procedure. Please see the instruction sheet given to you today. For further information please visit [email or web address]   Dear Mrs. Bolar  You are scheduled for a TEE 04/16/2020 with Dr. ROval Linsey  [facility] [address] [address]at 7:00 am  DIET: Nothing to eat or drink after midnight except a sip of water with medications (see medication instructions below)  Medication Instructions: Continue your anticoagulant: Plavix  You will need to continue your anticoagulant after your procedure until you  are told by your  Provider that it is safe to stop   Labs: CBC will be done today   You must have a responsible person to drive you home and stay in the waiting area during your procedure. Failure to do so could result in cancellation.  Bring your insurance cards.  *Special Note: Every  effort is made to have your procedure done on time. Occasionally there are emergencies that occur at the hospital  that may cause delays. Please be patient if a delay does occur.     Your physician has recommended that you wear an event monitor. Event monitors are medical devices that record the heart's electrical activity. Doctors most often [place] these monitors to diagnose arrhythmias. Arrhythmias are problems with the speed or rhythm of the heartbeat. The monitor is a small, portable device. You can wear one while you do your normal daily activities. This is usually used to diagnose what is causing palpitations/syncope (passing out). See instructions:  [employer] Cardiac Event Monitor Instructions Your physician has requested you wear your cardiac event monitor for 30 days, (1-30). [employer] may call or text to confirm a shipping address. The monitor will be sent to a land address via UPS. [employer] will not ship a monitor to a PO BOX. It typically takes 3-5 days to receive your monitor after it has been enrolled. [employer] will assist with USPS tracking if your package is delayed. The telephone number for [employer] is [phone]. Once you have received your monitor, please review the enclosed instructions. Instruction tutorials can also be viewed under help and settings on the enclosed cell phone. Your monitor has already been registered assigning a specific monitor serial # to you.  Applying the monitor Remove cell phone from case and turn it on. The cell phone works as Dealer and needs to be within [employer] of you at all times. The cell phone will need to be charged on a daily basis. We recommend you plug the cell phone into the enclosed charger at your bedside table every night.  Monitor batteries: You will receive two monitor batteries labelled #1 and #2. These are your recorders. Plug battery #2 onto the second connection on the enclosed charger. Keep one battery on the  charger at all times. This will keep the monitor battery deactivated. It will also keep it fully charged for when you need to switch your monitor batteries. A small light will be blinking on the battery emblem when it is charging. The light on the battery emblem will remain on when the battery is fully charged.  Open package of a Monitor strip. Insert battery #1 into black hood on strip and gently squeeze monitor battery onto connection as indicated in instruction booklet. Set aside while preparing skin.  Choose location for your strip, vertical or horizontal, as indicated in the instruction booklet. Shave to remove all hair from location. There cannot be any lotions, oils, powders, or colognes on skin where monitor is to be applied. Wipe skin clean with enclosed Saline wipe. Dry skin completely.  Peel paper labeled #1 off the back of the Monitor strip exposing the adhesive. Place the monitor on the chest in the vertical or horizontal position shown in the instruction booklet. One arrow on the monitor strip must be pointing upward. Carefully remove paper labeled #2, attaching remainder of strip to your skin. Try not to create any folds or wrinkles in the strip as you apply it.  Firmly press and release the circle in the center of the monitor battery. You will hear a small beep. This is turning the monitor battery on. The heart emblem on the monitor battery will light up every 5 seconds if the monitor battery in turned on and connected to the patient securely. Do not push and hold the circle down as this turns the monitor battery off. The cell phone will locate the monitor battery. A screen will appear on the cell phone checking the connection  of your monitor strip. This may read poor connection initially but change to good connection within the next minute. Once your monitor accepts the connection you will hear a series of 3 beeps followed by a climbing crescendo of beeps. A screen will appear  on the cell phone showing the two monitor strip placement options. Touch the picture that demonstrates where you applied the monitor strip.  Your monitor strip and battery are waterproof. You are able to shower, bathe, or swim with the monitor on. They just ask you do not submerge deeper than 3 feet underwater. We recommend removing the monitor if you are swimming in a lake, river, or ocean.  Your monitor battery will need to be switched to a fully charged monitor battery approximately once a week. The cell phone will alert you of an action which needs to be made.  On the cell phone, tap for details to reveal connection status, monitor battery status, and cell phone battery status. The green dots indicates your monitor is in good status. A red dot indicates there is something that needs your attention.  To record a symptom, click the circle on the monitor battery. In 30-60 seconds a list of symptoms will appear on the cell phone. Select your symptom and tap save. Your monitor will record a sustained or significant arrhythmia regardless of you clicking the button. Some patients do not feel the heart rhythm irregularities. [employer] will notify us of any serious or critical events.  Refer to instruction booklet for instructions on switching batteries, changing strips, the Do not disturb or Pause features, or any additional questions.  Call [employer] at [phone], to confirm your monitor is transmitting and record your baseline. They will answer any questions you may have regarding the monitor instructions at that time.  Returning the monitor to [place] all equipment back into blue box. Peel off strip of paper to expose adhesive and close box securely. There is a prepaid UPS shipping label on this box. Drop in a UPS drop box, or at a UPS facility like [employer]. You may also contact [employer] to arrange UPS to pick up monitor package at your home.    Follow-Up: At [facility], you and your health needs are our priority.  As part of our continuing mission to provide you with exceptional heart care, we have created designated Provider Care Teams.  These Care Teams include your primary Cardiologist (physician) and Advanced Practice Providers (APPs -  Physician Assistants and Nurse Practitioners) who all work together to provide you with the care you need, when you need it.  We recommend signing up for the patient portal called "MyChart".  Sign up information is provided on this After Visit Summary.  MyChart is used to connect with patients for Virtual Visits (Telemedicine).  Patients are able to view lab/test results, encounter notes, upcoming appointments, etc.  Non-urgent messages can be sent to your provider as well.   To learn more about what you can do with MyChart, go to [email or web address].    Your next appointment:   07/09/2020 ARRIVE AT 1:00  The format for your next appointment:   In Person  Provider:   Gwyndolyn Kaufman, MD   Other Instructions      Signed, Freada Bergeron, MD  04/13/2020 2:29 PM    [place]

## 2020-04-14 ENCOUNTER — Telehealth: Payer: Self-pay

## 2020-04-14 ENCOUNTER — Other Ambulatory Visit (HOSPITAL_COMMUNITY)
Admission: RE | Admit: 2020-04-14 | Discharge: 2020-04-14 | Disposition: A | Discharge status: Home / Self Care | Payer: 59 | Source: Ambulatory Visit | Attending: Cardiovascular Disease | Admitting: Cardiovascular Disease

## 2020-04-14 ENCOUNTER — Other Ambulatory Visit: Payer: Self-pay | Admitting: Internal Medicine

## 2020-04-14 DIAGNOSIS — Z01812 Encounter for preprocedural laboratory examination: Secondary | ICD-10-CM | POA: Insufficient documentation

## 2020-04-14 DIAGNOSIS — Z20822 Contact with and (suspected) exposure to covid-19: Secondary | ICD-10-CM | POA: Insufficient documentation

## 2020-04-14 DIAGNOSIS — I63321 Cerebral infarction due to thrombosis of right anterior cerebral artery: Secondary | ICD-10-CM

## 2020-04-14 DIAGNOSIS — I6330 Cerebral infarction due to thrombosis of unspecified cerebral artery: Secondary | ICD-10-CM

## 2020-04-14 NOTE — Telephone Encounter (Signed)
Dr. Etta Grandchild,  Tracey [name] was evaluated by Physical Therapy on 04/12/2020.  The patient would benefit from Speech Therapy evaluation for changes in Attention, Recall, and Memory.  If you agree, please place an order in [facility] workque in [facility] or fax the order to [phone]. Thank you, Adelfa Koh, PT, DPT  [facility]  [address] [place], [place]  [address] Phone:  [phone] Fax:  [phone]

## 2020-04-14 NOTE — Telephone Encounter (Signed)
Understand completely, my apologies. I will reach out to PCP.  Thank you,  Adelfa Koh, PT, DPT  [facility] [address] [place], [place]  [address] Phone:  [phone] Fax:  [phone]

## 2020-04-15 ENCOUNTER — Other Ambulatory Visit: Payer: Self-pay | Admitting: Cardiology

## 2020-04-15 LAB — SARS CORONAVIRUS 2 (TAT 6-24 HRS): SARS Coronavirus 2: NEGATIVE

## 2020-04-15 NOTE — Progress Notes (Signed)
Pre op call done for endo procedure tomorrow 04/17/19. Patient states she has been quarantined since covid test, will be NPO past midnight with sips for meds, confirmed she has a ride home, and will refrain from smoking morning of procedure. All questions addressed.

## 2020-04-15 NOTE — Anesthesia Preprocedure Evaluation (Addendum)
Anesthesia Evaluation  Patient identified by MRN, date of birth, ID band Patient awake    Reviewed: Allergy & Precautions, H&P , NPO status , Patient's Chart, lab work & pertinent test results  Airway Mallampati: III  TM Distance: >3 FB Neck ROM: Full    Dental no notable dental hx. (+) Teeth Intact, Dental Advisory Given   Pulmonary Current Smoker and Patient abstained from smoking.,    Pulmonary exam normal breath sounds clear to auscultation       Cardiovascular Exercise Tolerance: Good hypertension, Pt. on medications  Rhythm:Regular Rate:Normal     Neuro/Psych  Headaches, Anxiety Depression CVA    GI/Hepatic negative GI ROS, Neg liver ROS,   Endo/Other  diabetes, Gestational  Renal/GU negative Renal ROS  negative genitourinary   Musculoskeletal   Abdominal   Peds  Hematology negative hematology ROS (+)   Anesthesia Other Findings   Reproductive/Obstetrics negative OB ROS                            Anesthesia Physical Anesthesia Plan  ASA: III  Anesthesia Plan: MAC   Post-op Pain Management:    Induction: Intravenous  PONV Risk Score and Plan: 1 and Propofol infusion and Treatment may vary due to age or medical condition  Airway Management Planned: Natural Airway and Nasal Cannula  Additional Equipment:   Intra-op Plan:   Post-operative Plan:   Informed Consent: I have reviewed the patients History and Physical, chart, labs and discussed the procedure including the risks, benefits and alternatives for the proposed anesthesia with the patient or authorized representative who has indicated his/her understanding and acceptance.     Dental advisory given  Plan Discussed with: CRNA  Anesthesia Plan Comments:        Anesthesia Quick Evaluation

## 2020-04-16 ENCOUNTER — Ambulatory Visit (HOSPITAL_COMMUNITY)
Admission: RE | Admit: 2020-04-16 | Discharge: 2020-04-16 | Disposition: A | Discharge status: Home / Self Care | Payer: 59 | Attending: Cardiovascular Disease | Admitting: Cardiovascular Disease

## 2020-04-16 ENCOUNTER — Other Ambulatory Visit: Payer: Self-pay | Admitting: *Deleted

## 2020-04-16 ENCOUNTER — Ambulatory Visit (INDEPENDENT_AMBULATORY_CARE_PROVIDER_SITE_OTHER): Payer: 59

## 2020-04-16 ENCOUNTER — Encounter (HOSPITAL_COMMUNITY): Payer: Self-pay | Admitting: Cardiovascular Disease

## 2020-04-16 ENCOUNTER — Ambulatory Visit (HOSPITAL_BASED_OUTPATIENT_CLINIC_OR_DEPARTMENT_OTHER): Payer: 59

## 2020-04-16 ENCOUNTER — Ambulatory Visit (HOSPITAL_COMMUNITY): Payer: 59 | Admitting: Anesthesiology

## 2020-04-16 ENCOUNTER — Other Ambulatory Visit: Payer: Self-pay

## 2020-04-16 ENCOUNTER — Encounter (HOSPITAL_COMMUNITY)
Admission: RE | Disposition: A | Discharge status: Home / Self Care | Payer: Self-pay | Source: Home / Self Care | Attending: Cardiovascular Disease

## 2020-04-16 DIAGNOSIS — Z8673 Personal history of transient ischemic attack (TIA), and cerebral infarction without residual deficits: Secondary | ICD-10-CM | POA: Insufficient documentation

## 2020-04-16 DIAGNOSIS — E785 Hyperlipidemia, unspecified: Secondary | ICD-10-CM | POA: Insufficient documentation

## 2020-04-16 DIAGNOSIS — Z7982 Long term (current) use of aspirin: Secondary | ICD-10-CM | POA: Diagnosis not present

## 2020-04-16 DIAGNOSIS — E7800 Pure hypercholesterolemia, unspecified: Secondary | ICD-10-CM | POA: Diagnosis not present

## 2020-04-16 DIAGNOSIS — I6330 Cerebral infarction due to thrombosis of unspecified cerebral artery: Secondary | ICD-10-CM | POA: Diagnosis not present

## 2020-04-16 DIAGNOSIS — R002 Palpitations: Secondary | ICD-10-CM

## 2020-04-16 DIAGNOSIS — F17210 Nicotine dependence, cigarettes, uncomplicated: Secondary | ICD-10-CM | POA: Diagnosis not present

## 2020-04-16 DIAGNOSIS — I10 Essential (primary) hypertension: Secondary | ICD-10-CM | POA: Diagnosis not present

## 2020-04-16 DIAGNOSIS — Z7902 Long term (current) use of antithrombotics/antiplatelets: Secondary | ICD-10-CM | POA: Diagnosis not present

## 2020-04-16 DIAGNOSIS — I639 Cerebral infarction, unspecified: Secondary | ICD-10-CM | POA: Diagnosis not present

## 2020-04-16 DIAGNOSIS — I63321 Cerebral infarction due to thrombosis of right anterior cerebral artery: Secondary | ICD-10-CM | POA: Diagnosis not present

## 2020-04-16 DIAGNOSIS — Q211 Atrial septal defect: Secondary | ICD-10-CM | POA: Insufficient documentation

## 2020-04-16 DIAGNOSIS — R0683 Snoring: Secondary | ICD-10-CM | POA: Diagnosis not present

## 2020-04-16 DIAGNOSIS — I63429 Cerebral infarction due to embolism of unspecified anterior cerebral artery: Secondary | ICD-10-CM

## 2020-04-16 DIAGNOSIS — Q2112 Patent foramen ovale: Secondary | ICD-10-CM

## 2020-04-16 DIAGNOSIS — Z79899 Other long term (current) drug therapy: Secondary | ICD-10-CM | POA: Insufficient documentation

## 2020-04-16 HISTORY — PX: TEE WITHOUT CARDIOVERSION: SHX5443

## 2020-04-16 HISTORY — PX: BUBBLE STUDY: SHX6837

## 2020-04-16 SURGERY — ECHOCARDIOGRAM, TRANSESOPHAGEAL
Anesthesia: Monitor Anesthesia Care

## 2020-04-16 MED ORDER — LACTATED RINGERS IV SOLN
INTRAVENOUS | Status: AC | PRN
  Administered 2020-04-16: 1000 mL via INTRAVENOUS

## 2020-04-16 MED ORDER — SODIUM CHLORIDE 0.9 % IV SOLN: INTRAVENOUS | Status: DC

## 2020-04-16 MED ORDER — GLYCOPYRROLATE 0.2 MG/ML IJ SOLN
INTRAMUSCULAR | Status: DC | PRN
  Administered 2020-04-16 (×2): .2 mg via INTRAVENOUS

## 2020-04-16 MED ORDER — PROPOFOL 10 MG/ML IV BOLUS
INTRAVENOUS | Status: DC | PRN
  Administered 2020-04-16: 50 mg via INTRAVENOUS
  Administered 2020-04-16: 100 mg via INTRAVENOUS

## 2020-04-16 MED ORDER — PROPOFOL 500 MG/50ML IV EMUL
INTRAVENOUS | Status: DC | PRN
  Administered 2020-04-16: 50 ug/kg/min via INTRAVENOUS

## 2020-04-16 MED ORDER — LIDOCAINE 2% (20 MG/ML) 5 ML SYRINGE
INTRAMUSCULAR | Status: DC | PRN
  Administered 2020-04-16: 50 mg via INTRAVENOUS

## 2020-04-16 NOTE — Anesthesia Procedure Notes (Signed)
Procedure Name: MAC Date/Time: 04/16/2020 8:20 AM Performed by: Lowella Dell, CRNA Pre-anesthesia Checklist: Patient identified, Emergency Drugs available, Suction available, Patient being monitored and Timeout performed Patient Re-evaluated:Patient Re-evaluated prior to induction Oxygen Delivery Method: Nasal cannula Placement Confirmation: positive ETCO2 Dental Injury: Teeth and Oropharynx as per pre-operative assessment

## 2020-04-16 NOTE — CV Procedure (Signed)
Brief TEE Note  LVEF 60-65% Trivial MR, TR, and PR No LA/LAA thrombus or masses +PFO noted with right to left shunting at rest  For additional details see full report.  Jamail Cullers C. Duke Salvia, MD, [facility] 04/16/2020 8:37 AM

## 2020-04-16 NOTE — Anesthesia Postprocedure Evaluation (Signed)
Anesthesia Post Note  Patient: Tracey [name]  Procedure(s) Performed: TRANSESOPHAGEAL ECHOCARDIOGRAM (TEE) (N/A ) BUBBLE STUDY     Patient location during evaluation: Endoscopy Anesthesia Type: MAC Level of consciousness: awake and alert Pain management: pain level controlled Vital Signs Assessment: post-procedure vital signs reviewed and stable Respiratory status: spontaneous breathing, nonlabored ventilation and respiratory function stable Cardiovascular status: stable and blood pressure returned to baseline Postop Assessment: no apparent nausea or vomiting Anesthetic complications: no   No complications documented.  Last Vitals:  Vitals:   04/16/20 0850 04/16/20 0900  BP: (!) 140/93 (!) 146/91  Pulse: 89 84  Resp: 15 13  Temp:    SpO2: 99% 100%    Last Pain:  Vitals:   04/16/20 0900  TempSrc:   PainSc: 0-No pain                 Taren Dymek,W. EDMOND

## 2020-04-16 NOTE — Transfer of Care (Signed)
Immediate Anesthesia Transfer of Care Note  Patient: Tracey [name]  Procedure(s) Performed: TRANSESOPHAGEAL ECHOCARDIOGRAM (TEE) (N/A ) BUBBLE STUDY  Patient Location: PACU and Endoscopy Unit  Anesthesia Type:MAC  Level of Consciousness: awake, oriented and patient cooperative  Airway & Oxygen Therapy: Patient Spontanous Breathing  Post-op Assessment: Report given to RN and Post -op Vital signs reviewed and stable  Post vital signs: Reviewed and stable  Last Vitals:  Vitals Value Taken Time  BP 138/72 04/16/20 0843  Temp    Pulse 94 04/16/20 0847  Resp 17 04/16/20 0847  SpO2 99 % 04/16/20 0847  Vitals shown include unvalidated device data.  Last Pain:  Vitals:   04/16/20 0843  TempSrc:   PainSc: 0-No pain         Complications: No complications documented.

## 2020-04-16 NOTE — Progress Notes (Signed)
  Echocardiogram Echocardiogram Transesophageal has been performed.  Pieter Partridge 04/16/2020, 9:03 AM

## 2020-04-16 NOTE — Interval H&P Note (Signed)
History and Physical Interval Note:  04/16/2020 8:11 AM  Tracey [name]  has presented today for surgery, with the diagnosis of stroke.  The various methods of treatment have been discussed with the patient and family. After consideration of risks, benefits and other options for treatment, the patient has consented to  Procedure(s): TRANSESOPHAGEAL ECHOCARDIOGRAM (TEE) (N/A) as a surgical intervention.  The patient's history has been reviewed, patient examined, no change in status, stable for surgery.  I have reviewed the patient's chart and labs.  Questions were answered to the patient's satisfaction.     Chilton Si, MD

## 2020-04-16 NOTE — Discharge Instructions (Signed)

## 2020-04-18 ENCOUNTER — Encounter (HOSPITAL_COMMUNITY): Payer: Self-pay | Admitting: Cardiovascular Disease

## 2020-04-19 ENCOUNTER — Telehealth: Payer: Self-pay | Admitting: *Deleted

## 2020-04-19 ENCOUNTER — Encounter: Payer: Self-pay | Admitting: Physical Therapy

## 2020-04-19 ENCOUNTER — Ambulatory Visit: Payer: 59 | Admitting: Physical Therapy

## 2020-04-19 ENCOUNTER — Other Ambulatory Visit: Payer: Self-pay

## 2020-04-19 DIAGNOSIS — Q2112 Patent foramen ovale: Secondary | ICD-10-CM

## 2020-04-19 DIAGNOSIS — R262 Difficulty in walking, not elsewhere classified: Secondary | ICD-10-CM

## 2020-04-19 DIAGNOSIS — R2681 Unsteadiness on feet: Secondary | ICD-10-CM

## 2020-04-19 DIAGNOSIS — R2689 Other abnormalities of gait and mobility: Secondary | ICD-10-CM

## 2020-04-19 DIAGNOSIS — Q211 Atrial septal defect: Secondary | ICD-10-CM

## 2020-04-19 DIAGNOSIS — M6281 Muscle weakness (generalized): Secondary | ICD-10-CM

## 2020-04-19 NOTE — Telephone Encounter (Signed)
Left message to call back  Message sent to Lauren B RN regarding Dr Excell Seltzer appointment

## 2020-04-19 NOTE — Telephone Encounter (Signed)
-----   Message from Chilton Si, MD sent at 04/16/2020  2:22 PM EST ----- Regarding: structural heart referral Nicole Cella,  Can you please help me refer this lady to Dr. Excell Seltzer for PFO closure?  Also, she wants to follow up with me in clinic.  2-3 months is OK.  Thanks!

## 2020-04-19 NOTE — Patient Instructions (Signed)
Access Code: [email or web address] URL: [email or web address] Date: 04/19/2020 Prepared by: Sherlie Ban  Exercises Clamshell - 1 x daily - 5 x weekly - 2 sets - 7-8 reps Supine Short Arc Quad - 1-2 x daily - 5 x weekly - 2 sets - 10 reps Seated Long Arc Quad - 2 x daily - 5 x weekly - 2 sets - 5 reps Hooklying Gluteal Sets - 1-2 x daily - 5 x weekly - 2 sets - 10 reps Seated Heel Slide - 1-2 x daily - 5 x weekly - 2 sets - 10 reps

## 2020-04-19 NOTE — Telephone Encounter (Signed)
Nurse pick up encounter not needed

## 2020-04-19 NOTE — Therapy (Signed)
[facility] [address] [place], [place], [address] Phone: [phone]   Fax:  [phone]  Physical Therapy Treatment  Patient Details  Name: Tracey [name] MRN: [id number] Date of Birth: [birth year] Referring Provider (PT): Referred by Coralie Keens, MD. Followed Ihor Austin, NP   Encounter Date: 04/19/2020   PT End of Session - 04/19/20 1129    Visit Number 2    Number of Visits 13    Date for PT Re-Evaluation 07/11/20   POC for 6 weeks, Cert for 90 days   Authorization Type UHC    PT Start Time 0803    PT Stop Time 0845    PT Time Calculation (min) 42 min    Equipment Utilized During Treatment Gait belt    Activity Tolerance Patient tolerated treatment well;Patient limited by fatigue    Behavior During Therapy [facility] for tasks assessed/performed           Past Medical History:  Diagnosis Date  . Anxiety   . Bronchitis   . Carpal tunnel syndrome on both sides   . Depression   . Family history of adverse reaction to anesthesia    mother had problems waking up from surgery.  . Gestational diabetes mellitus    Normal 2 hr GTT postpartum  . Headache    migraines  . Hypertension   . Oligohydramnios antepartum 11/09/2017  . Panic attack 2017   diagnosed 3 months ago. no meds presently  . Reflux    did not filll prescription    Past Surgical History:  Procedure Laterality Date  . BREAST BIOPSY  right    cyst  . BUBBLE STUDY  04/16/2020   Procedure: BUBBLE STUDY;  Surgeon: Chilton Si, MD;  Location: [facility] ENDOSCOPY;  Service: Cardiovascular;;  . DILATION AND EVACUATION N/A 07/26/2015   Procedure: DILATATION AND EVACUATION;  Surgeon: Brock Bad, MD;  Location: [facility] ORS;  Service: Gynecology;  Laterality: N/A;  . TEE WITHOUT CARDIOVERSION N/A 04/16/2020   Procedure: TRANSESOPHAGEAL ECHOCARDIOGRAM (TEE);  Surgeon: Chilton Si, MD;  Location: [facility] ENDOSCOPY;  Service: Cardiovascular;   Laterality: N/A;    There were no vitals filed for this visit.   Subjective Assessment - 04/19/20 0805    Subjective Doing more walking at home - has been walking about 5 minutes, doing housework. Doing better than how she was doing last week. L leg still feels like it will give way.    Pertinent History HTN, depression, anxiety, Headaches, Carpal Tunnel (Bilateral)    Limitations Standing;Walking    How long can you stand comfortably? 10 minutes.    Patient Stated Goals Get Back to walking Normal; Build Endurance    Currently in Pain? No/denies              [facility] PT Assessment - 04/19/20 0807      6 Minute Walk- Baseline   6 Minute Walk- Baseline yes    BP (mmHg) 138/95    HR (bpm) 71    02 Sat (%RA) 97 %    Modified Borg Scale for Dyspnea 0- Nothing at all      6 Minute walk- Post Test   6 Minute Walk Post Test yes    BP (mmHg) 128/92    HR (bpm) 77    02 Sat (%RA) 98 %    Modified Borg Scale for Dyspnea 3- Moderate shortness of breath or breathing difficulty    Perceived Rate of Exertion (Borg) 11- Fairly light  6 minute walk test results    Aerobic Endurance Distance Walked 309    Endurance additional comments - needed 2 seated rest breaks                      Access Code: [email or web address] URL: [email or web address] Date: 04/19/2020 Prepared by: Sherlie Ban  Initiated HEP - see MedBridge for more details. Verbal and demo cues for proper technique   Exercises Clamshell - 1 x daily - 5 x weekly - 2 sets - 7-8 reps Supine Short Arc Quad - 1-2 x daily - 5 x weekly - 2 sets - 10 reps Seated Long Arc Quad - 2 x daily - 5 x weekly - 2 sets - 5 reps Hooklying Gluteal Sets - 1-2 x daily - 5 x weekly - 2 sets - 10 reps Seated Heel Slide - 1-2 x daily - 5 x weekly - 2 sets - 10 reps    [facility] Adult PT Treatment/Exercise - 04/19/20 0001      Ambulation/Gait   Ambulation/Gait Yes    Ambulation/Gait Assistance 5: Supervision     Ambulation Distance (Feet) 309 Feet   during   Assistive device None    Gait Pattern Step-through pattern;Decreased step length - right;Decreased stance time - left;Decreased hip/knee flexion - left;Decreased dorsiflexion - left;Decreased weight shift to left;Antalgic;Poor foot clearance - left    Ambulation Surface Level;Indoor      Exercises   Exercises Other Exercises    Other Exercises  attempted bridging x5 reps for HEP, however pt unable to perform at this time and compensated with incr trunk extension to perform vs. hip extension. also attempted L SLR, pt unable to perform at this time due to weakness                  PT Education - 04/19/20 1127    Education Details results of , initial HEP    Person(s) Educated Patient    Methods Explanation;Demonstration;Verbal cues;Handout    Comprehension Returned demonstration;Verbalized understanding            PT Short Term Goals - 04/19/20 1133      PT SHORT TERM GOAL #1   Title Patient will be independent with inital Strength/Balance HEP (all STGs due: 05/03/20)    Baseline no HEP established    Time 3    Period Weeks    Status New    Target Date 05/03/20      PT SHORT TERM GOAL #2   Title Patient will improve 5x sit <> stand to </= 12 seconds to demonstrate reduced fall risk    Baseline 14.22 secs    Time 3    Period Weeks    Status New      PT SHORT TERM GOAL #3   Title Patient will undergo endurance assessment ( vs. ) and LTG to be written if appropriate    Baseline 309' on 04/19/20 - LTG written as appropriate.    Time 3    Period Weeks    Status Achieved             PT Long Term Goals - 04/19/20 1133      PT LONG TERM GOAL #1   Title Patient will be independent with Final HEP for balance/strengthening (All LTGs Due: 05/24/20)    Baseline no HEP established    Time 6    Period Weeks    Status New      PT LONG  TERM GOAL #2   Title Patient will improve Berg Balance to >/= 48/56 to  demonstrate reduced fall risk and improved balance    Baseline 43/56    Time 6    Period Weeks    Status New      PT LONG TERM GOAL #3   Title Patient will improve gait speed to >/= 2.5 ft/sec to demonstrate improved community mobility    Baseline 1.75 ft/sec    Time 6    Period Weeks    Status New      PT LONG TERM GOAL #4   Title Patient will improve TUG to </= 12 seconds to demonstrate reduced fall risk    Baseline 14.59 secs    Time 6    Period Weeks    Status New      PT LONG TERM GOAL #5   Title Pt will improve by at least 200' in order to demo improved walking endurance with no rest breaks.    Baseline 309' with 2 seated rest breaks    Time 6    Period Weeks    Status Revised                 Plan - 04/19/20 1134    Clinical Impression Statement Performed the today with pt ambulating 309' and needing 2 seated rest breaks due to fatigue. LTG written as appropriate. No episodes of knee buckling during gait. Remainder of session focused on initiating HEP for seated/supine strengthening with pt needing intermittent rest breaks. Unable to perform bridging and SLR on LLE at this time due to weakness. Will continue to progress towards LTGs.    Personal Factors and Comorbidities Comorbidity 3+    Comorbidities HTN, depression, anxiety, Headaches, Carpal Tunnel (Bilateral)    Examination-Activity Limitations Stairs;Stand;Locomotion Level;Caring for Others    Examination-Participation Restrictions Cleaning;Occupation;Community Activity;Personal Finances    Stability/Clinical Decision Making Evolving/Moderate complexity    Rehab Potential Good    PT Frequency 2x / week    PT Duration 6 weeks    PT Treatment/Interventions ADLs/Self Care Home Management;Aquatic Therapy;Cryotherapy;Electrical Stimulation;Moist Heat;DME Instruction;Stair training;Gait training;Functional mobility training;Therapeutic activities;Therapeutic exercise;Balance training;Neuromuscular  re-education;Patient/family education;Orthotic Fit/Training;Manual techniques;Passive range of motion;Vestibular    PT Next Visit Plan how is HEP? update/revise as appropriate. continue LLE strength and endurance. SciFit? Potentially try foot up brace on L foot.    Consulted and Agree with Plan of Care Patient           Patient will benefit from skilled therapeutic intervention in order to improve the following deficits and impairments:  Abnormal gait,Decreased balance,Decreased endurance,Decreased mobility,Difficulty walking,Impaired sensation,Pain,Decreased strength,Decreased activity tolerance  Visit Diagnosis: Unsteadiness on feet  Muscle weakness (generalized)  Difficulty in walking, not elsewhere classified  Other abnormalities of gait and mobility     Problem List Patient Active Problem List   Diagnosis Date Noted  . Cerebrovascular accident (CVA) due to embolism of anterior cerebral artery (HCC)   . Encounter for general adult medical examination with abnormal findings 04/13/2020  . Visit for screening mammogram 04/13/2020  . Moderate episode of recurrent major depressive disorder (HCC) 04/09/2020  . 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 Cerebral thrombosis with cerebral infarction 04/04/2020  . Hypokalemia 04/03/2020  . Left leg weakness 04/02/2020  . Hypertension   . Depression   . Anxiety 11/27/2017  . Vitamin D deficiency 06/07/2017  . History of prior pregnancy with IUGR newborn 06/04/2017  .  Anxiety, generalized 02/09/2016    Drake Leach, PT, DPT  04/19/2020, 11:36 AM  [facility] [address] [place], [place], [address] Phone: [phone]   Fax:  [phone]  Name: Tracey [name] MRN: [id number] Date of Birth: 06-06-1975

## 2020-04-23 ENCOUNTER — Ambulatory Visit: Payer: 59 | Attending: Internal Medicine

## 2020-04-23 ENCOUNTER — Other Ambulatory Visit: Payer: Self-pay

## 2020-04-23 DIAGNOSIS — R262 Difficulty in walking, not elsewhere classified: Secondary | ICD-10-CM | POA: Insufficient documentation

## 2020-04-23 DIAGNOSIS — R2689 Other abnormalities of gait and mobility: Secondary | ICD-10-CM | POA: Diagnosis present

## 2020-04-23 DIAGNOSIS — R2681 Unsteadiness on feet: Secondary | ICD-10-CM | POA: Insufficient documentation

## 2020-04-23 DIAGNOSIS — M6281 Muscle weakness (generalized): Secondary | ICD-10-CM | POA: Diagnosis present

## 2020-04-23 DIAGNOSIS — R531 Weakness: Secondary | ICD-10-CM | POA: Insufficient documentation

## 2020-04-23 NOTE — Therapy (Signed)
[facility] [address] [place], [place], [address] Phone: [phone]   Fax:  [phone]  Physical Therapy Treatment  Patient Details  Name: Tracey [name] MRN: [id number] Date of Birth: 1975/06/10 Referring Provider (PT): Referred by Coralie Keens, MD. Followed Ihor Austin, NP   Encounter Date: 04/23/2020   PT End of Session - 04/23/20 0915    Visit Number 3    Number of Visits 13    Date for PT Re-Evaluation 07/11/20   POC for 6 weeks, Cert for 90 days   Authorization Type UHC    PT Start Time 0845    PT Stop Time 0930    PT Time Calculation (min) 45 min    Equipment Utilized During Treatment Gait belt    Activity Tolerance Patient tolerated treatment well;Patient limited by fatigue    Behavior During Therapy [facility] for tasks assessed/performed           Past Medical History:  Diagnosis Date  . Anxiety   . Bronchitis   . Carpal tunnel syndrome on both sides   . Depression   . Family history of adverse reaction to anesthesia    mother had problems waking up from surgery.  . Gestational diabetes mellitus    Normal 2 hr GTT postpartum  . Headache    migraines  . Hypertension   . Oligohydramnios antepartum 11/09/2017  . Panic attack 2017   diagnosed 3 months ago. no meds presently  . Reflux    did not filll prescription    Past Surgical History:  Procedure Laterality Date  . BREAST BIOPSY  right    cyst  . BUBBLE STUDY  04/16/2020   Procedure: BUBBLE STUDY;  Surgeon: Chilton Si, MD;  Location: [facility] ENDOSCOPY;  Service: Cardiovascular;;  . DILATION AND EVACUATION N/A 07/26/2015   Procedure: DILATATION AND EVACUATION;  Surgeon: Brock Bad, MD;  Location: [facility] ORS;  Service: Gynecology;  Laterality: N/A;  . TEE WITHOUT CARDIOVERSION N/A 04/16/2020   Procedure: TRANSESOPHAGEAL ECHOCARDIOGRAM (TEE);  Surgeon: Chilton Si, MD;  Location: [facility] ENDOSCOPY;  Service: Cardiovascular;   Laterality: N/A;    There were no vitals filed for this visit.   Subjective Assessment - 04/23/20 0853    Subjective Has been walking more at home as well as returnig to ADLs    Pertinent History HTN, depression, anxiety, Headaches, Carpal Tunnel (Bilateral)    Limitations Standing;Walking    How long can you stand comfortably? 10 minutes.    Patient Stated Goals Get Back to walking Normal; Build Endurance    Currently in Pain? No/denies                             [facility] Adult PT Treatment/Exercise - 04/23/20 0001      Ambulation/Gait   Gait Pattern Step-through pattern    Ambulation Surface Level;Indoor      Knee/Hip Exercises: Aerobic   Other Aerobic scifit 6'@L1 .5      Knee/Hip Exercises: Seated   Long Arc Quad Both;2 sets;15 reps    Long Arc Quad Limitations alternating on second [employer] Both;2 sets;15 reps    Marching Limitations alternating      Knee/Hip Exercises: Supine   Short Arc [employer] Strengthening;Both;2 sets;15 reps   alternating   Bridges Both;2 sets;15 reps    Bridges Limitations able to perform this date      Ankle Exercises: Standing  Heel Raises Both;15 reps   2 sets   Toe Raise 15 reps   2 sets              Balance Exercises - 04/23/20 0001      Balance Exercises: Standing   Standing Eyes Opened Narrow base of support (BOS);Foam/compliant surface;2 reps;30 secs    Standing Eyes Closed Narrow base of support (BOS);Foam/compliant surface;2 reps;30 secs               PT Short Term Goals - 04/19/20 1133      PT SHORT TERM GOAL #1   Title Patient will be independent with inital Strength/Balance HEP (all STGs due: 05/03/20)    Baseline no HEP established    Time 3    Period Weeks    Status New    Target Date 05/03/20      PT SHORT TERM GOAL #2   Title Patient will improve 5x sit <> stand to </= 12 seconds to demonstrate reduced fall risk    Baseline 14.22 secs    Time 3    Period Weeks    Status New       PT SHORT TERM GOAL #3   Title Patient will undergo endurance assessment ( vs. ) and LTG to be written if appropriate    Baseline 309' on 04/19/20 - LTG written as appropriate.    Time 3    Period Weeks    Status Achieved             PT Long Term Goals - 04/19/20 1133      PT LONG TERM GOAL #1   Title Patient will be independent with Final HEP for balance/strengthening (All LTGs Due: 05/24/20)    Baseline no HEP established    Time 6    Period Weeks    Status New      PT LONG TERM GOAL #2   Title Patient will improve Berg Balance to >/= 48/56 to demonstrate reduced fall risk and improved balance    Baseline 43/56    Time 6    Period Weeks    Status New      PT LONG TERM GOAL #3   Title Patient will improve gait speed to >/= 2.5 ft/sec to demonstrate improved community mobility    Baseline 1.75 ft/sec    Time 6    Period Weeks    Status New      PT LONG TERM GOAL #4   Title Patient will improve TUG to </= 12 seconds to demonstrate reduced fall risk    Baseline 14.59 secs    Time 6    Period Weeks    Status New      PT LONG TERM GOAL #5   Title Pt will improve by at least 200' in order to demo improved walking endurance with no rest breaks.    Baseline 309' with 2 seated rest breaks    Time 6    Period Weeks    Status Revised                 Plan - 04/23/20 0915    Clinical Impression Statement Patient reporting continued fatigue but is able to do more at home including walking and returning to ADLs, she continues to note l side deficits but denies falls or LOB episodes, she was able to tolerate bridging today as well as other LE strengthening and balance tasks with less fatigue but still in need of  frequent rest breaks    Personal Factors and Comorbidities Comorbidity 3+    Comorbidities HTN, depression, anxiety, Headaches, Carpal Tunnel (Bilateral)    Examination-Activity Limitations Stairs;Stand;Locomotion Level;Caring for Others     Examination-Participation Restrictions Cleaning;Occupation;Community Activity;Personal Finances    Stability/Clinical Decision Making Evolving/Moderate complexity    Rehab Potential Good    PT Frequency 2x / week    PT Duration 6 weeks    PT Treatment/Interventions ADLs/Self Care Home Management;Aquatic Therapy;Cryotherapy;Electrical Stimulation;Moist Heat;DME Instruction;Stair training;Gait training;Functional mobility training;Therapeutic activities;Therapeutic exercise;Balance training;Neuromuscular re-education;Patient/family education;Orthotic Fit/Training;Manual techniques;Passive range of motion;Vestibular    PT Next Visit Plan incorporate balance and strengthening tasks into fuctional activities, continue with endurance training    Consulted and Agree with Plan of Care Patient           Patient will benefit from skilled therapeutic intervention in order to improve the following deficits and impairments:  Abnormal gait,Decreased balance,Decreased endurance,Decreased mobility,Difficulty walking,Impaired sensation,Pain,Decreased strength,Decreased activity tolerance  Visit Diagnosis: Unsteadiness on feet  Muscle weakness (generalized)  Difficulty in walking, not elsewhere classified     Problem List Patient Active Problem List   Diagnosis Date Noted  . Cerebrovascular accident (CVA) due to embolism of anterior cerebral artery (HCC)   . Encounter for general adult medical examination with abnormal findings 04/13/2020  . Visit for screening mammogram 04/13/2020  . Moderate episode of recurrent major depressive disorder (HCC) 04/09/2020  . 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 Cerebral thrombosis with cerebral infarction 04/04/2020  . Hypokalemia 04/03/2020  . Left leg weakness 04/02/2020  . Hypertension   . Depression   . Anxiety 11/27/2017  . Vitamin D deficiency 06/07/2017  . History of prior pregnancy with  IUGR newborn 06/04/2017  . Anxiety, generalized 02/09/2016    Hildred Laser 04/23/2020, 9:44 AM  [facility] [address] [place], [place], [address] Phone: [phone]   Fax:  [phone]  Name: Tracey [name] MRN: [id number] Date of Birth: 06-05-1975

## 2020-04-26 ENCOUNTER — Telehealth: Payer: Self-pay | Admitting: Cardiology

## 2020-04-26 ENCOUNTER — Ambulatory Visit: Payer: 59

## 2020-04-26 NOTE — Telephone Encounter (Signed)
Pt c/o medication issue:  1. Name of Medication: clopidogrel (PLAVIX) 75 MG tablet    2. How are you currently taking this medication (dosage and times per day)? As prescribed.  3. Are you having a reaction (difficulty breathing--STAT)? No.  4. What is your medication issue? Patient states that she was put on a 21 day supply of this medication and now she is completely out. She would like to know if she needs a refill or is she done taking it. Please advise.

## 2020-04-26 NOTE — Telephone Encounter (Signed)
Spoke with the patient and advised her that she was only supposed to be on Plavix for 21 days per discharge summary. She verbalized understanding and will continue with ASA daily.

## 2020-04-26 NOTE — Telephone Encounter (Signed)
Pt is requesting a refill on Clopidogrel. This medication is no longer on pt's medication list and pt stated that she was supposed to take this medication for 21 days. Does pt still supposed to be taking this medication? Please address

## 2020-04-28 ENCOUNTER — Ambulatory Visit: Payer: 59

## 2020-04-30 ENCOUNTER — Ambulatory Visit: Payer: 59

## 2020-04-30 ENCOUNTER — Other Ambulatory Visit: Payer: Self-pay

## 2020-04-30 DIAGNOSIS — R531 Weakness: Secondary | ICD-10-CM

## 2020-04-30 DIAGNOSIS — M6281 Muscle weakness (generalized): Secondary | ICD-10-CM

## 2020-04-30 DIAGNOSIS — R2681 Unsteadiness on feet: Secondary | ICD-10-CM | POA: Diagnosis not present

## 2020-04-30 NOTE — Therapy (Signed)
[facility] [address] [place], [place], [address] Phone: [phone]   Fax:  [phone]  Physical Therapy Treatment  Patient Details  Name: Tracey [name] MRN: [id number] Date of Birth: 10/01/1975 Referring Provider (PT): Referred by Coralie Keens, MD. Followed Ihor Austin, NP   Encounter Date: 04/30/2020   PT End of Session - 04/30/20 0914    Visit Number 4    Number of Visits 13    Date for PT Re-Evaluation 07/11/20   POC for 6 weeks, Cert for 90 days   Authorization Type [employer]    PT Start Time 0845    PT Stop Time 0930    PT Time Calculation (min) 45 min    Equipment Utilized During Treatment Gait belt    Activity Tolerance Patient tolerated treatment well;Patient limited by fatigue    Behavior During Therapy [facility] for tasks assessed/performed           Past Medical History:  Diagnosis Date  . Anxiety   . Bronchitis   . Carpal tunnel syndrome on both sides   . Depression   . Family history of adverse reaction to anesthesia    mother had problems waking up from surgery.  . Gestational diabetes mellitus    Normal 2 hr GTT postpartum  . Headache    migraines  . Hypertension   . Oligohydramnios antepartum 11/09/2017  . Panic attack 2017   diagnosed 3 months ago. no meds presently  . Reflux    did not filll prescription    Past Surgical History:  Procedure Laterality Date  . BREAST BIOPSY  right    cyst  . BUBBLE STUDY  04/16/2020   Procedure: BUBBLE STUDY;  Surgeon: Chilton Si, MD;  Location: [facility] ENDOSCOPY;  Service: Cardiovascular;;  . DILATION AND EVACUATION N/A 07/26/2015   Procedure: DILATATION AND EVACUATION;  Surgeon: Brock Bad, MD;  Location: [facility] ORS;  Service: Gynecology;  Laterality: N/A;  . TEE WITHOUT CARDIOVERSION N/A 04/16/2020   Procedure: TRANSESOPHAGEAL ECHOCARDIOGRAM (TEE);  Surgeon: Chilton Si, MD;  Location: [facility] ENDOSCOPY;  Service: Cardiovascular;   Laterality: N/A;    There were no vitals filed for this visit.   Subjective Assessment - 04/30/20 0848    Subjective no changes in pain, feels she is stronger, no need of AD today, wants to walk wit her 45 yo but does not yet feel confident due to strength and endurance issues    Pertinent History HTN, depression, anxiety, Headaches, Carpal Tunnel (Bilateral)    Limitations Standing;Walking    How long can you stand comfortably? 10 minutes.    Patient Stated Goals Get Back to walking Normal; Build Endurance    Currently in Pain? No/denies                             [facility] Adult PT Treatment/Exercise - 04/30/20 0001      Transfers   Transfers Sit to Stand    Five time sit to stand comments  performed on airex for 15 reps      Knee/Hip Exercises: Aerobic   Other Aerobic scifit 8' at L 1.5      Knee/Hip Exercises: Seated   Long Arc Quad Strengthening;Both;2 sets;15 reps    [employer] Limitations alternating on second set, 2# weights around arches to promote DF, 2x15    Marching Strengthening;2 sets;15 reps    Marching Limitations alternating B sets with 2# wts  on arches      Knee/Hip Exercises: Supine   Short Arc Quad Sets Strengthening;Both;2 sets;15 reps    Short Arc Quad Sets Limitations 2# weight on arches    Bridges with [employer] Strengthening;2 sets;15 reps               Balance Exercises - 04/30/20 0001      Balance Exercises: Standing   Step Ups --   performed in // bars using 4" step, 1x15 each LE              PT Short Term Goals - 04/19/20 1133      PT SHORT TERM GOAL #1   Title Patient will be independent with inital Strength/Balance HEP (all STGs due: 05/03/20)    Baseline no HEP established    Time 3    Period Weeks    Status New    Target Date 05/03/20      PT SHORT TERM GOAL #2   Title Patient will improve 5x sit <> stand to </= 12 seconds to demonstrate reduced fall risk    Baseline 14.22 secs    Time 3    Period  Weeks    Status New      PT SHORT TERM GOAL #3   Title Patient will undergo endurance assessment ( vs. ) and LTG to be written if appropriate    Baseline 309' on 04/19/20 - LTG written as appropriate.    Time 3    Period Weeks    Status Achieved             PT Long Term Goals - 04/19/20 1133      PT LONG TERM GOAL #1   Title Patient will be independent with Final HEP for balance/strengthening (All LTGs Due: 05/24/20)    Baseline no HEP established    Time 6    Period Weeks    Status New      PT LONG TERM GOAL #2   Title Patient will improve Berg Balance to >/= 48/56 to demonstrate reduced fall risk and improved balance    Baseline 43/56    Time 6    Period Weeks    Status New      PT LONG TERM GOAL #3   Title Patient will improve gait speed to >/= 2.5 ft/sec to demonstrate improved community mobility    Baseline 1.75 ft/sec    Time 6    Period Weeks    Status New      PT LONG TERM GOAL #4   Title Patient will improve TUG to </= 12 seconds to demonstrate reduced fall risk    Baseline 14.59 secs    Time 6    Period Weeks    Status New      PT LONG TERM GOAL #5   Title Pt will improve by at least 200' in order to demo improved walking endurance with no rest breaks.    Baseline 309' with 2 seated rest breaks    Time 6    Period Weeks    Status Revised                 Plan - 04/30/20 0914    Clinical Impression Statement Continues to impprove with overall strength and mobility, stiil hesitant to walk alone, no use of AD today, LLE remains weak and patient relutant to trust it when ambulating long distances, endurance deficts still noted, advanced to stepping activites for strength and  balance with focus on LLE.  Need of frequent rest breaks as well as pacing to acomodate fatigue  and sterngth deficits    Personal Factors and Comorbidities Comorbidity 3+    Comorbidities HTN, depression, anxiety, Headaches, Carpal Tunnel (Bilateral)     Examination-Activity Limitations Stairs;Stand;Locomotion Level;Caring for Others    Examination-Participation Restrictions Cleaning;Occupation;Community Activity;Personal Finances    Stability/Clinical Decision Making Evolving/Moderate complexity    Rehab Potential Good    PT Frequency 2x / week    PT Duration 6 weeks    PT Treatment/Interventions ADLs/Self Care Home Management;Aquatic Therapy;Cryotherapy;Electrical Stimulation;Moist Heat;DME Instruction;Stair training;Gait training;Functional mobility training;Therapeutic activities;Therapeutic exercise;Balance training;Neuromuscular re-education;Patient/family education;Orthotic Fit/Training;Manual techniques;Passive range of motion;Vestibular    PT Next Visit Plan incorporate balance and strengthening tasks into fuctional activities, continue with endurance training, focus on LLE    Consulted and Agree with Plan of Care Patient           Patient will benefit from skilled therapeutic intervention in order to improve the following deficits and impairments:  Abnormal gait,Decreased balance,Decreased endurance,Decreased mobility,Difficulty walking,Impaired sensation,Pain,Decreased strength,Decreased activity tolerance  Visit Diagnosis: Unsteadiness on feet  Decreased strength     Problem List Patient Active Problem List   Diagnosis Date Noted  . Cerebrovascular accident (CVA) due to embolism of anterior cerebral artery (HCC)   . Encounter for general adult medical examination with abnormal findings 04/13/2020  . Visit for screening mammogram 04/13/2020  . Moderate episode of recurrent major depressive disorder (HCC) 04/09/2020  . 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 Cerebral thrombosis with cerebral infarction 04/04/2020  . Hypokalemia 04/03/2020  . Left leg weakness 04/02/2020  . Hypertension   . Depression   . Anxiety 11/27/2017  . Vitamin D deficiency 06/07/2017   . History of prior pregnancy with IUGR newborn 06/04/2017  . Anxiety, generalized 02/09/2016    Hildred Laser PT 04/30/2020, 11:41 AM  [facility] [address] [place], [place], [address] Phone: [phone]   Fax:  [phone]  Name: Tracey [name] MRN: [id number] Date of Birth: 10-22-1975

## 2020-05-03 ENCOUNTER — Other Ambulatory Visit: Payer: Self-pay

## 2020-05-03 ENCOUNTER — Ambulatory Visit (INDEPENDENT_AMBULATORY_CARE_PROVIDER_SITE_OTHER): Payer: 59 | Admitting: Adult Health

## 2020-05-03 ENCOUNTER — Encounter: Payer: Self-pay | Admitting: Adult Health

## 2020-05-03 ENCOUNTER — Ambulatory Visit: Payer: 59

## 2020-05-03 VITALS — BP 149/77 | HR 75 | Ht 66.0 in | Wt 219.0 lb

## 2020-05-03 DIAGNOSIS — I639 Cerebral infarction, unspecified: Secondary | ICD-10-CM | POA: Diagnosis not present

## 2020-05-03 DIAGNOSIS — Q211 Atrial septal defect: Secondary | ICD-10-CM | POA: Diagnosis not present

## 2020-05-03 DIAGNOSIS — R29818 Other symptoms and signs involving the nervous system: Secondary | ICD-10-CM | POA: Diagnosis not present

## 2020-05-03 DIAGNOSIS — F418 Other specified anxiety disorders: Secondary | ICD-10-CM | POA: Diagnosis not present

## 2020-05-03 DIAGNOSIS — Q2112 Patent foramen ovale: Secondary | ICD-10-CM

## 2020-05-03 NOTE — Patient Instructions (Addendum)
Referral placed for sleep apnea testing - you will be called by [facility] sleep clinic to schedule initial evaluation   Continue working with therapies for likely ongoing recovery   Highly recommend restarting bupropion for depression/anxiety issues which can worsen after your stroke  Follow up with Dr. Excell Seltzer regarding PFO closure  Highly recommend complete tobacco cessation   Continue aspirin 81 mg daily  and Crestor 10mg  daily  for secondary stroke prevention  Continue to follow up with PCP regarding cholesterol and blood pressure management  Maintain strict control of hypertension with blood pressure goal below 130/90 and cholesterol with LDL cholesterol (bad cholesterol) goal below 70 mg/dL.       Followup in the future with me in 3 months or call earlier if needed      Thank you for coming to see at [facility]. I hope we have been able to provide you high quality care today.  You may receive a patient satisfaction survey over the next few weeks. We would appreciate your feedback and comments so that we may continue to improve ourselves and the health of our patients.

## 2020-05-03 NOTE — Progress Notes (Signed)
[facility] [address] [place] [address] [phone]       HOSPITAL FOLLOW UP NOTE  [name] Date of Birth:  11-08-1975 Medical Record Number:  [id number]   Reason for Referral:  hospital stroke follow up    SUBJECTIVE:   CHIEF COMPLAINT:  Chief Complaint  Patient presents with  . Follow-up    TR with husband  (tyjuan) PT is well, feels some tightness is back of head, Tingling in L leg     HPI:   [name] is a 45 y.o. female with history of hypertension, anxiety (panic attacks), headaches (migraines), ongoing tobacco use, and depression (just started Wellbutrin on the day of admission)  who presented to the [facility] emergency department  on 04/02/2020 and seen by teleNeurology for acute onset neck discomfort, left leg numbness / weakness, left arm numbness and apressure sensation at the base of her posterior neck in the midline.   Personally reviewed hospitalization pertinent progress notes, lab work and imaging with summary provided.  Shortly transferred to [facility] evaluated by Dr. Leonie Man with stroke work-up revealing small acute infarct in the juxtacortical high right posterior frontal lobe, possibly embolic secondary to unknown source.  Recommend completion of TEE outpatient.  Hypercoagulable labs pending at discharge.  Recommended DAPT for 3 weeks and aspirin alone. LDL 101 and initiated Crestor 10 mg daily.  A1c 5.1.  Other stroke risk factors include current tobacco use, obesity and history of migraines but no prior stroke history.  Other active problems include suspected sleep apnea, bradycardia and hypokalemia.  Evaluated by therapies and recommended outpatient PT and discharged home in stable condition.  Stroke: Small acute infarct in the juxtacortical high right posterior frontal lobe possibly embolic - etiology unknown.  Resultant mild left leg weakness and numbness which is resolving  Code Stroke CT Head -  Normal head CT. ASPECTS is  10.      CT head - not ordered  MRI head - Small acute infarct in the juxtacortical high right posterior frontal lobe. Minimal edema without mass effect. Additional mild scattered T2/FLAIR hyperintensities within the white matter, mildly advanced for age. These are nonspecific and could relate to chronic microvascular ischemic disease, prior migraines, trauma, demyelination, or inflammation.  CTA H&N - normal  2D Echo - EF 55 - 60%. No cardiac source of emboli identified.   [employer] Virus 2 - negative  LDL - 101  HgbA1c - 5.1  UDS - negative  VTE prophylaxis - Lovenox  No antithrombotic prior to admission, now on aspirin 81 mg daily and clopidogrel 75 mg daily for 3 weeks then aspirin alone  Patient will be counseled to be compliant with her antithrombotic medications  Ongoing aggressive stroke risk factor management  Therapy recommendations:  Outpt PT  Disposition:  Home   Today, May 03, 2020, [name] is being seen for hospital follow-up accompanied by her husband.  She reports gradual improvement since discharge with residual LLE weakness (feels like knee wants to hyperextend), gait impairment and cognitive impairment.  Currently working with PT and has initial evaluation with SLP tomorrow for cognitive therapy.  She has not returned back to work currently on short-term disability working for [employer].  Baseline depression/anxiety with worsening anxiety and depression but also self discontinued bupropion at hospital discharge as she was fearful of possible increased stroke risk with continued use.  Reports increased fatigue since her stroke with fatigue PTA as well as snoring, witnessed  apnea and insomnia.  She has not previously underwent sleep study.  Denies new stroke/TIA symptoms.  She has completed 3 weeks DAPT and remains on aspirin alone without bleeding or bruising.  Remains on Crestor 10 mg daily without myalgias.  Blood  pressure today 149/77.  She does admit to smoking 1 cigarette/day with goal of complete tobacco cessation in the near future.  TEE completed 04/16/2020 which showed evidence of PFO.  Initial evaluation with Dr. Excell Seltzer for possible closure scheduled on 05/28/2020.  Hypercoagulable labs negative.  No further concerns at this time.     ROS:   14 system review of systems performed and negative with exception of those listed in HPI  PMH:  Past Medical History:  Diagnosis Date  . Anxiety   . Bronchitis   . Carpal tunnel syndrome on both sides   . Depression   . Family history of adverse reaction to anesthesia    mother had problems waking up from surgery.  . Gestational diabetes mellitus    Normal 2 hr GTT postpartum  . Headache    migraines  . Hypertension   . Oligohydramnios antepartum 11/09/2017  . Panic attack 2017   diagnosed 3 months ago. no meds presently  . Reflux    did not filll prescription    PSH:  Past Surgical History:  Procedure Laterality Date  . BREAST BIOPSY  right    cyst  . BUBBLE STUDY  04/16/2020   Procedure: BUBBLE STUDY;  Surgeon: Chilton Si, MD;  Location: [facility] ENDOSCOPY;  Service: Cardiovascular;;  . DILATION AND EVACUATION N/A 07/26/2015   Procedure: DILATATION AND EVACUATION;  Surgeon: Brock Bad, MD;  Location: [facility] ORS;  Service: Gynecology;  Laterality: N/A;  . TEE WITHOUT CARDIOVERSION N/A 04/16/2020   Procedure: TRANSESOPHAGEAL ECHOCARDIOGRAM (TEE);  Surgeon: Chilton Si, MD;  Location: [facility] ENDOSCOPY;  Service: Cardiovascular;  Laterality: N/A;    Social History:  Social History   Socioeconomic History  . Marital status: Married    Spouse name: Not on file  . Number of children: Not on file  . Years of education: Not on file  . Highest education level: Not on file  Occupational History  . Not on file  Tobacco Use  . Smoking status: Current Some Day Smoker    Packs/day: 0.25    Types: Cigarettes  . Smokeless tobacco: Never Used   . Tobacco comment: 2 cig/day  Vaping Use  . Vaping Use: Never used  Substance and Sexual Activity  . Alcohol use: No    Alcohol/week: 0.0 standard drinks  . Drug use: No  . Sexual activity: Not Currently  Other Topics Concern  . Not on file  Social History Narrative  . Not on file   Social Determinants of Health   Financial Resource Strain: Not on file  Food Insecurity: Not on file  Transportation Needs: Not on file  Physical Activity: Not on file  Stress: Not on file  Social Connections: Not on file  Intimate Partner Violence: Not on file    Family History:  Family History  Problem Relation Age of Onset  . Hypertension Mother   . Diabetes Maternal Aunt   . Diabetes Maternal Uncle     Medications:   Current Outpatient Medications on File Prior to Visit  Medication Sig Dispense Refill  . aspirin 81 MG EC tablet Take 1 tablet (81 mg total) by mouth daily. Swallow whole. 30 tablet 0  . buPROPion (WELLBUTRIN) 75 MG tablet Take 75 mg  by mouth 2 (two) times daily.    . COD LIVER OIL PO Take 1 capsule by mouth daily.    . hydrOXYzine (VISTARIL) 25 MG capsule Take 25 mg by mouth daily as needed.    Marland Kitchen lisinopril (ZESTRIL) 20 MG tablet Take 1.5 tablets (30 mg total) by mouth daily. 135 tablet 3  . Multiple Vitamins-Minerals (MULTIVITAMIN WITH MINERALS) tablet Take 1 tablet by mouth daily.    . Probiotic Product (PROBIOTIC DAILY PO) Take 1 tablet by mouth daily.     . rosuvastatin (CRESTOR) 10 MG tablet Take 1 tablet (10 mg total) by mouth daily. 30 tablet 0  . Vilazodone HCl (VIIBRYD STARTER PACK) 10 & 20 MG KIT Take 1 tablet by mouth daily. 1 kit 0   No current facility-administered medications on file prior to visit.    Allergies:  No Known Allergies    OBJECTIVE:  Physical Exam  Vitals:   05/03/20 1315  BP: (!) 149/77  Pulse: 75  Weight: 219 lb (99.3 kg)  Height: $Remove'5\' 6"'RDJhNSG$  (1.676 m)   Body mass index is 35.35 kg/m. No exam data present  General: well  developed, well nourished,  very pleasant middle-aged African-American female, seated, in no evident distress Head: head normocephalic and atraumatic.   Neck: supple with no carotid or supraclavicular bruits Cardiovascular: regular rate and rhythm, no murmurs Musculoskeletal: no deformity Skin:  no rash/petichiae Vascular:  Normal pulses all extremities   Neurologic Exam Mental Status: Awake and fully alert.   Fluent speech and language.  Oriented to place and time. Recent and remote memory intact. Attention span, concentration and fund of knowledge appropriate during visit but subjectively impaired. Mood and affect appropriate.  Cranial Nerves: Fundoscopic exam reveals sharp disc margins. Pupils equal, briskly reactive to light. Extraocular movements full without nystagmus. Visual fields full to confrontation. Hearing intact. Facial sensation intact. Face, tongue, palate moves normally and symmetrically.  Motor: Normal bulk and tone. Normal strength in all tested extremity muscles except mild left ankle dorsiflexion weakness and increased tone throughout LLE Sensory.: intact to touch , pinprick , position and vibratory sensation.  Coordination: Rapid alternating movements normal in all extremities. Finger-to-nose and heel-to-shin performed accurately bilaterally. Gait and Station: Arises from chair without difficulty. Stance is normal. Gait demonstrates  decreased LLE stride length and step height and difficult tandem walk due to unsteadiness and imbalance.  Romberg negative. Reflexes: 1+ and symmetric. Toes downgoing.     NIHSS  0 Modified Rankin  2-3      ASSESSMENT: Yianna Tersigni is a 45 y.o. year old female presented with acute onset of neck discomfort, left leg numbness/weakness, left arm numbness and pressure sensation at base of posterior neck midline on 03/02/2021 with stroke work-up revealing small acute infarct in the juxtacortical high right posterior frontal lobe, possibly  embolic secondary to unknown source. Vascular risk factors include HTN, HLD, tobacco use, suspected sleep apnea, obesity, migraines and new dx of PFO (per TEE 04/16/2020).      PLAN:  1. R frontal lobe stroke, cryptogenic:  a. Residual deficit: LLE weakness, gait impairment, cognitive impairment and worsening baseline anxiety/depression.  Continue working with PT and start SLP tomorrow for likely ongoing recovery.  Would recommend continued short-term disability for at least additional 2 months for further recovery time.  Previously completed during hospitalization covered until 2/18 - advised her that either her PCP or this office can further assist with extending short-term disability -she was advised to call or return back with paperwork  if she needs further assistance from our office b. Complete 30-day cardiac event monitor to further evaluate for atrial fibrillation -she does report occasional palpitations c. Hypercoagulable labs negative d. Continue aspirin 81 mg daily  and Crestor 10 mg daily for secondary stroke prevention.   e. Discussed secondary stroke prevention measures and importance of close PCP follow up for aggressive stroke risk factor management  2. PFO: a. As evidenced on TEE 04/16/2020 b. ROPE score 5 indicating 34% chance that stroke was due to PFO c. Schedule evaluation with Dr. Burt Knack on 05/28/2020 to discuss closure options 3. Depression and anxiety a. Reports worsening poststroke -on bupropion PTA and PCP recently started Viibryd -reports self discontinuing bupropion and he has not started Viibryd due to fear of side effects increasing stroke risk.  Encouraged her to restart bupropion 75 mg twice daily due to worsening symptoms post stroke but also possibly due to self discontinuing antidepressant.  This may also help further with complete tobacco cessation b. Advised her to follow-up with PCP as advised for ongoing management and monitoring 4. Suspected sleep apnea:   a. Referral placed to [facility] for further evaluation b. Recent cryptogenic stroke, hypertension, obesity, daytime fatigue, snoring, insomnia and witnessed apnea 5. HTN: BP goal <130/90.  Stable on lisinopril per PCP 6. HLD: LDL goal <70. Recent LDL 101. On Crestor $RemoveBe'10mg'[id number]$  daily -request follow-up with PCP in the next 1 to 2 months for repeat lipid panel and ongoing prescribing of statin    Follow up in 3 months or call earlier if needed   CC:  [facility] provider: Dr. Lenda Kelp, Arvid Right, MD    I spent 45 minutes of face-to-face and non-face-to-face time with patient and husband.  This included previsit chart review including hospitalization pertinent progress notes, lab work and imaging, lab review, study review, order entry, electronic health record documentation, patient education regarding recent stroke and potential etiology, residual deficits, importance of managing stroke risk factors and answered all other questions to patient and husband's satisfaction  Frann Rider, AGNP-BC  [facility] [address] [place], [place] [address]  Phone [phone] Fax [phone] Note: This document was prepared with digital dictation and possible smart phrase technology. Any transcriptional errors that result from this process are unintentional.

## 2020-05-04 ENCOUNTER — Ambulatory Visit: Payer: 59 | Attending: Internal Medicine | Admitting: Speech Pathology

## 2020-05-04 ENCOUNTER — Encounter: Payer: Self-pay | Admitting: Speech Pathology

## 2020-05-04 DIAGNOSIS — R4701 Aphasia: Secondary | ICD-10-CM | POA: Diagnosis present

## 2020-05-04 DIAGNOSIS — R41841 Cognitive communication deficit: Secondary | ICD-10-CM | POA: Insufficient documentation

## 2020-05-04 NOTE — Therapy (Signed)
[facility] [address] [place], [place], [address] Phone: [phone]   Fax:  [phone]  Speech Language Pathology Evaluation  Patient Details  Name: Tracey [name] MRN: [id number] Date of Birth: 19-Jan-1976 Referring Provider (SLP): Etta Grandchild   Encounter Date: 05/04/2020   End of Session - 05/04/20 1021    Visit Number 1    Number of Visits 25    Date for SLP Re-Evaluation 08/02/19    SLP Start Time 0936    SLP Stop Time  1019    SLP Time Calculation (min) 43 min    Activity Tolerance Patient tolerated treatment well           Past Medical History:  Diagnosis Date  . Anxiety   . Bronchitis   . Carpal tunnel syndrome on both sides   . Depression   . Family history of adverse reaction to anesthesia    mother had problems waking up from surgery.  . Gestational diabetes mellitus    Normal 2 hr GTT postpartum  . Headache    migraines  . Hypertension   . Oligohydramnios antepartum 11/09/2017  . Panic attack 2017   diagnosed 3 months ago. no meds presently  . Reflux    did not filll prescription    Past Surgical History:  Procedure Laterality Date  . BREAST BIOPSY  right    cyst  . BUBBLE STUDY  04/16/2020   Procedure: BUBBLE STUDY;  Surgeon: Chilton Si, MD;  Location: [facility] ENDOSCOPY;  Service: Cardiovascular;;  . DILATION AND EVACUATION N/A 07/26/2015   Procedure: DILATATION AND EVACUATION;  Surgeon: Brock Bad, MD;  Location: [facility];  Service: Gynecology;  Laterality: N/A;  . TEE WITHOUT CARDIOVERSION N/A 04/16/2020   Procedure: TRANSESOPHAGEAL ECHOCARDIOGRAM (TEE);  Surgeon: Chilton Si, MD;  Location: [facility] ENDOSCOPY;  Service: Cardiovascular;  Laterality: N/A;    There were no vitals filed for this visit.   Subjective Assessment - 05/04/20 0942    Subjective "I have some trouble remembering words."    Currently in Pain? No/denies              SLP Evaluation [facility] - 05/04/20 0942       Subjective   Patient/Family Stated Goal "I want to be able to recall things better so I can get back to my normal things; school/work/master's".      General Information   HPI Patient is a 45 y.o. female with a hx of anxiety, depression, HTN, tobacco abuse, CVA and gestational diabetes who was referred by Dr. Yetta [name] for further evaluation of her recent stroke. The patient was hospitalized from 01/14-01/16/22 for acute onset neck discomfort, left arm and leg numbness and associated weakness. Head CT without acute changes but MRI brain showed small acute infarct in the juxtacortical high right posterior frontal lobe.      Balance Screen   Has the patient fallen in the past 6 months No    Has the patient had a decrease in activity level because of a fear of falling?  No    Is the patient reluctant to leave their home because of a fear of falling?  No      Prior Functional Status   Type of Home House     Lives With Spouse;Daughter    Available Support Family    Vocation On disability      Cognition   Overall Cognitive Status Impaired/Different from baseline    Area of Impairment Attention;Memory  Executive Function Reasoning;Sequencing;Organizing      Oral Motor/Sensory Function   Overall Oral Motor/Sensory Function Appears within functional limits for tasks assessed      Motor Speech   Overall Motor Speech Appears within functional limits for tasks assessed      Standardized Assessments   Standardized Assessments  Other Assessment    Other Assessment SLUMS            SLU Mental Status (SLUMS Examination)  Orientation: 3/3 Delayed Recall w/ Interference: 3/5 Numeric Calculation and Registration: 1/3 Immediate Recall w/ Interference (Generative naming): 1/3 Registration and Digit Span: 2/2 Visual Spatial/Exec Functioning: 4/6 Executive Functioning/Extrapolation:  4/8  Total: 17/30   Auditory Comprehension -Able to follow 2&3 step directions -Able to answer complex  yes/no questions  -Able to answer questions related to paragraph reading  Expression -Able to name body parts and objects  -Able to complete simple Wh-questions  -Occasional anomia noted during conversation  -Disorganized thought process noted during describing task              SLP Short Term Goals - 05/04/20 1031      SLP SHORT TERM GOAL #1   Title Pt will utilize word finding strategies (of a list) given a verbal prompt to increase word finding ability.    Time 6    Period Weeks    Status New      SLP SHORT TERM GOAL #2   Title Pt will recall 2 memory strategies to assist with medication and financial management.    Time 6    Period Weeks    Status New      SLP SHORT TERM GOAL #3   Title Patient will demo attention to detail in school/work-like material with double checking/self-corrections.    Time 6    Period Weeks    Status New      SLP SHORT TERM GOAL #4   Title Patient will complete planning/organization exercises with mod verbal and visual cues.    Time 6    Period Weeks    Status New            SLP Long Term Goals - 05/04/20 1028      SLP LONG TERM GOAL #1   Title Patient will participate in restorative and compensatory therapy to improve verbal expression by incorporating strategies to communicate wants and needs effectively to different conversational partners, maintain safety and participate socially in their functional living environment.    Time 12    Period Weeks    Status New      SLP LONG TERM GOAL #2   Title Patient will demonstrate use of memory strategies to schedule activities, recall weekly events and items to maintain safety to participate socially in functional living environment    Time 12    Period Weeks    Status New      SLP LONG TERM GOAL #3   Title Patient will increase information processing speed during planning and organization daily living activities to improve safety and awareness in functional living environment     Time 12    Period Weeks    Status New      SLP LONG TERM GOAL #4   Title Patient will develop functional attention skills to effectively attend to and communicate in tasks of daily living in their functional living environment.    Time 12    Period Weeks    Status New  Plan - 05/04/20 1022    Clinical Impression Statement Pt is a 45 yo female with hx significant for RCVA. 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 She would like to get back to work and continue pursuing her bachelors degree. Since the CVA, patient reports trouble with recalling words and difficulty holding information in her head. Cognitive-communication was assessed this date using the SLUMS. Patient scored a 17/30 indicating a moderate impairment. Difficulty noted in working memory, short term memory, attention, and generative naming. Informal assessment of receptive and expressive language skills noted rare anomia, but more disorganized thought processes. Given extra time, patient was able to achieve the correct word. SLP rec skilled speech services to address expressive aphasia and cognitive-communication impairment to achieve participation in all ADLs.    Speech Therapy Frequency 2x / week    Duration 12 weeks    Treatment/Interventions Compensatory strategies;Cueing hierarchy;Functional tasks;Patient/family education;Environmental controls;Cognitive reorganization;Multimodal communcation approach;Language facilitation;Compensatory techniques;Internal/external aids;SLP instruction and feedback    Potential to Achieve Goals Good    SLP Home Exercise Plan Will address next session    Consulted and Agree with Plan of Care Patient           Patient will benefit from skilled therapeutic intervention in order to improve the following deficits and impairments:   Cognitive communication deficit  Aphasia    Problem List Patient Active Problem List   Diagnosis  Date Noted  . Cerebrovascular accident (CVA) due to embolism of anterior cerebral artery (HCC)   . Encounter for general adult medical examination with abnormal findings 04/13/2020  . Visit for screening mammogram 04/13/2020  . Moderate episode of recurrent major depressive disorder (HCC) 04/09/2020  . 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 Cerebral thrombosis with cerebral infarction 04/04/2020  . Hypokalemia 04/03/2020  . Left leg weakness 04/02/2020  . Hypertension   . Depression   . Anxiety 11/27/2017  . Vitamin D deficiency 06/07/2017  . History of prior pregnancy with IUGR newborn 06/04/2017  . Anxiety, generalized 02/09/2016    Dorena Bodo M.S. [place], CBIS  05/04/2020, 10:34 AM  [facility] [address] [place], [place], [address] Phone: [phone]   Fax:  [phone]  Name: Preeti Winegardner MRN: [id number] Date of Birth: Jun 07, 1975

## 2020-05-04 NOTE — Progress Notes (Signed)
I agree with the above plan 

## 2020-05-05 ENCOUNTER — Other Ambulatory Visit: Payer: Self-pay

## 2020-05-05 ENCOUNTER — Ambulatory Visit: Payer: 59

## 2020-05-05 DIAGNOSIS — R2681 Unsteadiness on feet: Secondary | ICD-10-CM

## 2020-05-05 DIAGNOSIS — R2689 Other abnormalities of gait and mobility: Secondary | ICD-10-CM

## 2020-05-05 DIAGNOSIS — M6281 Muscle weakness (generalized): Secondary | ICD-10-CM

## 2020-05-05 DIAGNOSIS — R262 Difficulty in walking, not elsewhere classified: Secondary | ICD-10-CM

## 2020-05-05 NOTE — Therapy (Signed)
[facility] [address] [place], [place], [address] Phone: [phone]   Fax:  [phone]  Physical Therapy Treatment  Patient Details  Name: Tracey [name] MRN: [id number] Date of Birth: 1976-02-04 Referring Provider (PT): Referred by Tawni Millers, MD. Followed Frann Rider, NP   Encounter Date: 05/05/2020   PT End of Session - 05/05/20 0805    Visit Number 5    Number of Visits 13    Date for PT Re-Evaluation 07/11/20   POC for 6 weeks, Cert for 90 days   Authorization Type [employer]    PT Start Time 0805   patient arriving few minutes late   PT Stop Time 0845    PT Time Calculation (min) 40 min    Equipment Utilized During Treatment Gait belt    Activity Tolerance Patient tolerated treatment well;Patient limited by fatigue    Behavior During Therapy [facility] for tasks assessed/performed           Past Medical History:  Diagnosis Date  . Anxiety   . Bronchitis   . Carpal tunnel syndrome on both sides   . Depression   . Family history of adverse reaction to anesthesia    mother had problems waking up from surgery.  . Gestational diabetes mellitus    Normal 2 hr GTT postpartum  . Headache    migraines  . Hypertension   . Oligohydramnios antepartum 11/09/2017  . Panic attack 2017   diagnosed 3 months ago. no meds presently  . Reflux    did not filll prescription    Past Surgical History:  Procedure Laterality Date  . BREAST BIOPSY  right    cyst  . BUBBLE STUDY  04/16/2020   Procedure: BUBBLE STUDY;  Surgeon: Skeet Latch, MD;  Location: [place];  Service: Cardiovascular;;  . DILATION AND EVACUATION N/A 07/26/2015   Procedure: DILATATION AND EVACUATION;  Surgeon: Shelly Bombard, MD;  Location: [place];  Service: Gynecology;  Laterality: N/A;  . TEE WITHOUT CARDIOVERSION N/A 04/16/2020   Procedure: TRANSESOPHAGEAL ECHOCARDIOGRAM (TEE);  Surgeon: Skeet Latch, MD;  Location: [place];   Service: Cardiovascular;  Laterality: N/A;    There were no vitals filed for this visit.   Subjective Assessment - 05/05/20 0806    Subjective Patient reports that she was walking over the weekend and she noticed she leg wanted to get weak/give out on her after about 30 minutes. No other new changes.    Pertinent History HTN, depression, anxiety, Headaches, Carpal Tunnel (Bilateral)    Limitations Standing;Walking    How long can you stand comfortably? 10 minutes.    Patient Stated Goals Get Back to walking Normal; Build Endurance    Currently in Pain? No/denies              [facility] Adult PT Treatment/Exercise - 05/05/20 0001      Transfers   Transfers Sit to Stand;Stand to Sit    Sit to Stand 5: Supervision    Five time sit to stand comments  12.91 secs with UE support    Stand to Sit 5: Supervision      Ambulation/Gait   Ambulation/Gait Yes    Ambulation/Gait Assistance 5: Supervision    Ambulation/Gait Assistance Details completed ambulation throughout therapy gym with activities    Assistive device None    Gait Pattern Step-through pattern    Ambulation Surface Level;Indoor      Exercises   Exercises Other Exercises    Other Exercises  Completed entire review of current HEP to ensure compliance. Patient requesting to review HEP adn potential add more seated activites with ankle weights for improved strengthening. Patient also reporting she may joing [facility], PT educating that this would be beneficial and is definitely option for continued strengthening and improved activity tolerance.      Knee/Hip Exercises: Aerobic   Other Aerobic Completed SciFit on Level 1.5 with BUE/BLE x 8 minutes for improved activity tolerance/endurance.      Knee/Hip Exercises: Standing   Heel Raises Both;10 reps;3 seconds;Limitations;2 sets    Heel Raises Limitations completed with 2# ankle weights    Other Standing Knee Exercises Completed standing alternating marching with 2# ankle weights,  completed 2 sets x 15 reps. Light UE support required. Completed standing hamstring curl with 2# ankle weights, completed 2 x 10 reps. verbal cues for technique.    Other Standing Knee Exercises Completed mini squats with light UE support from chair, completed 2 x 10 reps. intermittent rest breaks required.      Knee/Hip Exercises: Seated   Long Arc Quad AROM;Strengthening;Both;10 reps;2 sets;Weights    Long Arc Quad Weight 2 lbs.    Long [employer] Limitations alternating, 2# ankle weights. cues for slowed control.          Verbal Review of Current HEP:   Access Code: [email or web address] URL: [email or web address] Date: 04/19/2020 Prepared by: Janann August  Exercises Clamshell - 1 x daily - 5 x weekly - 2 sets - 7-8 reps Supine Short Arc Quad - 1-2 x daily - 5 x weekly - 2 sets - 10 reps Seated Long Arc Quad - 2 x daily - 5 x weekly - 2 sets - 5 reps Hooklying Gluteal Sets - 1-2 x daily - 5 x weekly - 2 sets - 10 reps Seated Heel Slide - 1-2 x daily - 5 x weekly - 2 sets - 10 reps    PT Short Term Goals - 05/05/20 6568      PT SHORT TERM GOAL #1   Title Patient will be independent with inital Strength/Balance HEP (all STGs due: 05/03/20)    Baseline reports independence, currently completing daily    Time 3    Period Weeks    Status Achieved    Target Date 05/03/20      PT SHORT TERM GOAL #2   Title Patient will improve 5x sit <> stand to </= 12 seconds to demonstrate reduced fall risk    Baseline 14.22 secs; 12.91 secs with UE support    Time 3    Period Weeks    Status Not Met      PT SHORT TERM GOAL #3   Title Patient will undergo endurance assessment (3MWT vs. 6MWT) and LTG to be written if appropriate    Baseline 309' on 04/19/20 - LTG written as appropriate.    Time 3    Period Weeks    Status Achieved             PT Long Term Goals - 04/19/20 1133      PT LONG TERM GOAL #1   Title Patient will be independent with Final HEP for balance/strengthening  (All LTGs Due: 05/24/20)    Baseline no HEP established    Time 6    Period Weeks    Status New      PT LONG TERM GOAL #2   Title Patient will improve Berg Balance to >/= 48/56 to demonstrate reduced fall risk and improved balance  Baseline 43/56    Time 6    Period Weeks    Status New      PT LONG TERM GOAL #3   Title Patient will improve gait speed to >/= 2.5 ft/sec to demonstrate improved community mobility    Baseline 1.75 ft/sec    Time 6    Period Weeks    Status New      PT LONG TERM GOAL #4   Title Patient will improve TUG to </= 12 seconds to demonstrate reduced fall risk    Baseline 14.59 secs    Time 6    Period Weeks    Status New      PT LONG TERM GOAL #5   Title Pt will improve 3MWT by at least 200' in order to demo improved walking endurance with no rest breaks.    Baseline 309' with 2 seated rest breaks    Time 6    Period Weeks    Status Revised                 Plan - 05/05/20 0808    Clinical Impression Statement Today's skilled PT session included assesment of patient's progress toward STG. Patient able to meet STG 1 and 3 today during session. Rest of session focused on continued strengthening exercises with patient requiring intermittent rest breaks due to fatigue. Patient is making steady progress with PT services and wiIll continue to benefit from skilled PT to progress toward all LTGs.    Personal Factors and Comorbidities Comorbidity 3+    Comorbidities HTN, depression, anxiety, Headaches, Carpal Tunnel (Bilateral)    Examination-Activity Limitations Stairs;Stand;Locomotion Level;Caring for Others    Examination-Participation Restrictions Cleaning;Occupation;Community Activity;Personal Finances    Stability/Clinical Decision Making Evolving/Moderate complexity    Rehab Potential Good    PT Frequency 2x / week    PT Duration 6 weeks    PT Treatment/Interventions ADLs/Self Care Home Management;Aquatic Therapy;Cryotherapy;Electrical  Stimulation;Moist Heat;DME Instruction;Stair training;Gait training;Functional mobility training;Therapeutic activities;Therapeutic exercise;Balance training;Neuromuscular re-education;Patient/family education;Orthotic Fit/Training;Manual techniques;Passive range of motion;Vestibular    PT Next Visit Plan incorporate balance and strengthening tasks into fuctional activities, continue with endurance training, focus on LLE    Consulted and Agree with Plan of Care Patient           Patient will benefit from skilled therapeutic intervention in order to improve the following deficits and impairments:  Abnormal gait,Decreased balance,Decreased endurance,Decreased mobility,Difficulty walking,Impaired sensation,Pain,Decreased strength,Decreased activity tolerance  Visit Diagnosis: Unsteadiness on feet  Muscle weakness (generalized)  Difficulty in walking, not elsewhere classified  Other abnormalities of gait and mobility     Problem List Patient Active Problem List   Diagnosis Date Noted  . Cerebrovascular accident (CVA) due to embolism of anterior cerebral artery ([place])   . Encounter for general adult medical examination with abnormal findings 04/13/2020  . Visit for screening mammogram 04/13/2020  . Moderate episode of recurrent major depressive disorder ([place]) 04/09/2020  . Cerebrovascular accident (CVA) due to thrombosis of right anterior cerebral artery ([place]) 04/09/2020  . Cerebral thrombosis with cerebral infarction 04/04/2020  . Hypokalemia 04/03/2020  . Left leg weakness 04/02/2020  . Hypertension   . Depression   . Anxiety 11/27/2017  . Vitamin D deficiency 06/07/2017  . History of prior pregnancy with IUGR newborn 06/04/2017  . Anxiety, generalized 02/09/2016    Jones Bales 05/05/2020, 8:45 AM  [facility] [address] [place], [place], [address] Phone: [phone]   Fax:  [phone]  Name: Tracey [name] MRN: [id number] Date of Birth: 06-11-1975

## 2020-05-07 ENCOUNTER — Other Ambulatory Visit: Payer: Self-pay

## 2020-05-07 ENCOUNTER — Ambulatory Visit: Payer: 59 | Admitting: Speech Pathology

## 2020-05-07 ENCOUNTER — Ambulatory Visit: Payer: 59 | Admitting: Physical Therapy

## 2020-05-07 ENCOUNTER — Encounter: Payer: Self-pay | Admitting: Speech Pathology

## 2020-05-07 ENCOUNTER — Encounter: Payer: Self-pay | Admitting: Physical Therapy

## 2020-05-07 DIAGNOSIS — R41841 Cognitive communication deficit: Secondary | ICD-10-CM

## 2020-05-07 DIAGNOSIS — R262 Difficulty in walking, not elsewhere classified: Secondary | ICD-10-CM

## 2020-05-07 DIAGNOSIS — R2681 Unsteadiness on feet: Secondary | ICD-10-CM | POA: Diagnosis not present

## 2020-05-07 DIAGNOSIS — R2689 Other abnormalities of gait and mobility: Secondary | ICD-10-CM

## 2020-05-07 DIAGNOSIS — R4701 Aphasia: Secondary | ICD-10-CM

## 2020-05-07 DIAGNOSIS — M6281 Muscle weakness (generalized): Secondary | ICD-10-CM

## 2020-05-07 LAB — HYPERCOAGULABLE PANEL, COMPREHENSIVE
APTT: 26.6 s
AT III Act/Nor PPP Chro: 111 %
Act. Prt C Resist w/FV Defic.: 2.8 ratio
Anticardiolipin Ab, IgG: <10 [GPL'U]
Anticardiolipin Ab, IgM: <10 [MPL'U]
Beta-2 Glycoprotein I, IgA: <10 SAU
Beta-2 Glycoprotein I, IgG: <10 SGU
Beta-2 Glycoprotein I, IgM: <10 SMU
DRVVT Screen Seconds: 37.8 s
Factor VII Antigen**: 85 %
Factor VIII Activity: 137 %
Hexagonal Phospholipid Neutral: 0 s
Homocysteine: 7.2 umol/L
Prot C Ag Act/Nor PPP Imm: 98 %
Prot S Ag Act/Nor PPP Imm: 89 %
Protein C Ag/FVII Ag Ratio**: 1.2 ratio
Protein S Ag/FVII Ag Ratio**: 1 ratio

## 2020-05-07 NOTE — Therapy (Signed)
[facility] [address] [place], [place], [address] Phone: [phone]   Fax:  [phone]  Physical Therapy Treatment  Patient Details  Name: Kassadi Presswood MRN: [id number] Date of Birth: 06-May-1975 Referring Provider (PT): Referred by Tawni Millers, MD. Followed Frann Rider, NP   Encounter Date: 05/07/2020   PT End of Session - 05/07/20 1152    Visit Number 6    Number of Visits 13    Date for PT Re-Evaluation 07/11/20   POC for 6 weeks, Cert for 90 days   Authorization Type [employer]    PT Start Time 0805   pt arrived late   PT Stop Time 0844    PT Time Calculation (min) 39 min    Activity Tolerance Patient tolerated treatment well;Patient limited by fatigue    Behavior During Therapy [facility] for tasks assessed/performed           Past Medical History:  Diagnosis Date  . Anxiety   . Bronchitis   . Carpal tunnel syndrome on both sides   . Depression   . Family history of adverse reaction to anesthesia    mother had problems waking up from surgery.  . Gestational diabetes mellitus    Normal 2 hr GTT postpartum  . Headache    migraines  . Hypertension   . Oligohydramnios antepartum 11/09/2017  . Panic attack 2017   diagnosed 3 months ago. no meds presently  . Reflux    did not filll prescription    Past Surgical History:  Procedure Laterality Date  . BREAST BIOPSY  right    cyst  . BUBBLE STUDY  04/16/2020   Procedure: BUBBLE STUDY;  Surgeon: Skeet Latch, MD;  Location: [facility];  Service: Cardiovascular;;  . DILATION AND EVACUATION N/A 07/26/2015   Procedure: DILATATION AND EVACUATION;  Surgeon: Shelly Bombard, MD;  Location: [facility];  Service: Gynecology;  Laterality: N/A;  . TEE WITHOUT CARDIOVERSION N/A 04/16/2020   Procedure: TRANSESOPHAGEAL ECHOCARDIOGRAM (TEE);  Surgeon: Skeet Latch, MD;  Location: [facility];  Service: Cardiovascular;  Laterality: N/A;    There were no  vitals filed for this visit.   Subjective Assessment - 05/07/20 0806    Subjective right hip has been a little bothersome. Has been exercising.    Pertinent History HTN, depression, anxiety, Headaches, Carpal Tunnel (Bilateral)    Limitations Standing;Walking    How long can you stand comfortably? 10 minutes.    Patient Stated Goals Get Back to walking Normal; Build Endurance    Currently in Pain? No/denies                             [facility] Adult PT Treatment/Exercise - 05/07/20 0812      Exercises   Exercises Other Exercises    Other Exercises  staggered stance sit <> stands with LLE posteriorly x4 reps, standing with BUE support and sitting without UE support with focus on eccentric control.      Knee/Hip Exercises: Aerobic   Other Aerobic Completed SciFit on Level 1.5 with BUE/BLE (primarily just BLE) x 6 minutes for improved activity tolerance/strengthening/endurance.      Knee/Hip Exercises: Standing   Lateral Step Up Left;1 set;10 reps;Hand Hold: 1    Lateral Step Up Limitations on aerobic step cues for technique   Forward Step Up Left;1 set;10 reps;Hand Hold: 1;Step Height: 4"    Forward Step Up Limitations on aerobic step cues for  technique      Knee/Hip Exercises: Supine   Bridges AROM;Strengthening;1 set;10 reps    Bridges Limitations with tactile cues into L ASIS to press up with LLE first    Straight Leg Raises AAROM;Strengthening;Left;2 sets;5 reps   Verbal and tactile cues for technique, initial assist for AAROM, minimal range.    Other Supine Knee/Hip Exercises heel digs with LLE into red pball and pulling into hip/knee flexion 2 x 5 reps                    PT Short Term Goals - 05/05/20 6378      PT SHORT TERM GOAL #1   Title Patient will be independent with inital Strength/Balance HEP (all STGs due: 05/03/20)    Baseline reports independence, currently completing daily    Time 3    Period Weeks    Status Achieved    Target Date  05/03/20      PT SHORT TERM GOAL #2   Title Patient will improve 5x sit <> stand to </= 12 seconds to demonstrate reduced fall risk    Baseline 14.22 secs; 12.91 secs with UE support    Time 3    Period Weeks    Status Not Met      PT SHORT TERM GOAL #3   Title Patient will undergo endurance assessment (3MWT vs. 6MWT) and LTG to be written if appropriate    Baseline 309' on 04/19/20 - LTG written as appropriate.    Time 3    Period Weeks    Status Achieved             PT Long Term Goals - 04/19/20 1133      PT LONG TERM GOAL #1   Title Patient will be independent with Final HEP for balance/strengthening (All LTGs Due: 05/24/20)    Baseline no HEP established    Time 6    Period Weeks    Status New      PT LONG TERM GOAL #2   Title Patient will improve Berg Balance to >/= 48/56 to demonstrate reduced fall risk and improved balance    Baseline 43/56    Time 6    Period Weeks    Status New      PT LONG TERM GOAL #3   Title Patient will improve gait speed to >/= 2.5 ft/sec to demonstrate improved community mobility    Baseline 1.75 ft/sec    Time 6    Period Weeks    Status New      PT LONG TERM GOAL #4   Title Patient will improve TUG to </= 12 seconds to demonstrate reduced fall risk    Baseline 14.59 secs    Time 6    Period Weeks    Status New      PT LONG TERM GOAL #5   Title Pt will improve 3MWT by at least 200' in order to demo improved walking endurance with no rest breaks.    Baseline 309' with 2 seated rest breaks    Time 6    Period Weeks    Status Revised                 Plan - 05/07/20 1155    Clinical Impression Statement Today's skilled session focused on LLE>RLE strengthening. Pt tolerated session well - needed intermittent rest breaks due to fatigue. Pt able to perform an active SLR today within minimal range. Will continue to progress towrads  LTGs.    Personal Factors and Comorbidities Comorbidity 3+    Comorbidities HTN, depression,  anxiety, Headaches, Carpal Tunnel (Bilateral)    Examination-Activity Limitations Stairs;Stand;Locomotion Level;Caring for Others    Examination-Participation Restrictions Cleaning;Occupation;Community Activity;Personal Finances    Stability/Clinical Decision Making Evolving/Moderate complexity    Rehab Potential Good    PT Frequency 2x / week    PT Duration 6 weeks    PT Treatment/Interventions ADLs/Self Care Home Management;Aquatic Therapy;Cryotherapy;Electrical Stimulation;Moist Heat;DME Instruction;Stair training;Gait training;Functional mobility training;Therapeutic activities;Therapeutic exercise;Balance training;Neuromuscular re-education;Patient/family education;Orthotic Fit/Training;Manual techniques;Passive range of motion;Vestibular    PT Next Visit Plan incorporate balance and strengthening tasks into fuctional activities, continue with endurance training, focus on LLE. SciFit/NuStep for endurance/strength    Consulted and Agree with Plan of Care Patient           Patient will benefit from skilled therapeutic intervention in order to improve the following deficits and impairments:  Abnormal gait,Decreased balance,Decreased endurance,Decreased mobility,Difficulty walking,Impaired sensation,Pain,Decreased strength,Decreased activity tolerance  Visit Diagnosis: Muscle weakness (generalized)  Unsteadiness on feet  Difficulty in walking, not elsewhere classified  Other abnormalities of gait and mobility     Problem List Patient Active Problem List   Diagnosis Date Noted  . Cerebrovascular accident (CVA) due to embolism of anterior cerebral artery ([place])   . Encounter for general adult medical examination with abnormal findings 04/13/2020  . Visit for screening mammogram 04/13/2020  . Moderate episode of recurrent major depressive disorder ([place]) 04/09/2020  . Cerebrovascular accident (CVA) due to thrombosis of right anterior cerebral artery ([place]) 04/09/2020  . Cerebral  thrombosis with cerebral infarction 04/04/2020  . Hypokalemia 04/03/2020  . Left leg weakness 04/02/2020  . Hypertension   . Depression   . Anxiety 11/27/2017  . Vitamin D deficiency 06/07/2017  . History of prior pregnancy with IUGR newborn 06/04/2017  . Anxiety, generalized 02/09/2016    Arliss Journey, PT, DPT 05/07/2020, 11:56 AM  [facility] [address] [place], [place], [address] Phone: [phone]   Fax:  [phone]  Name: Jonell Brumbaugh MRN: [id number] Date of Birth: 01/19/1976

## 2020-05-07 NOTE — Therapy (Signed)
[facility] [address] [place], [place], [address] Phone: [phone]   Fax:  [phone]  Speech Language Pathology Treatment  Patient Details  Name: Tracey [name] MRN: [id number] Date of Birth: Mar 14, 1976 Referring Provider (SLP): Etta Grandchild   Encounter Date: 05/07/2020   End of Session - 05/07/20 1009    Visit Number 2    Number of Visits 25    Date for SLP Re-Evaluation 08/02/19    SLP Start Time 0930    SLP Stop Time  1015    SLP Time Calculation (min) 45 min    Activity Tolerance Patient tolerated treatment well           Past Medical History:  Diagnosis Date  . Anxiety   . Bronchitis   . Carpal tunnel syndrome on both sides   . Depression   . Family history of adverse reaction to anesthesia    mother had problems waking up from surgery.  . Gestational diabetes mellitus    Normal 2 hr GTT postpartum  . Headache    migraines  . Hypertension   . Oligohydramnios antepartum 11/09/2017  . Panic attack 2017   diagnosed 3 months ago. no meds presently  . Reflux    did not filll prescription    Past Surgical History:  Procedure Laterality Date  . BREAST BIOPSY  right    cyst  . BUBBLE STUDY  04/16/2020   Procedure: BUBBLE STUDY;  Surgeon: Chilton Si, MD;  Location: [facility];  Service: Cardiovascular;;  . DILATION AND EVACUATION N/A 07/26/2015   Procedure: DILATATION AND EVACUATION;  Surgeon: Brock Bad, MD;  Location: [facility];  Service: Gynecology;  Laterality: N/A;  . TEE WITHOUT CARDIOVERSION N/A 04/16/2020   Procedure: TRANSESOPHAGEAL ECHOCARDIOGRAM (TEE);  Surgeon: Chilton Si, MD;  Location: [facility]COPY;  Service: Cardiovascular;  Laterality: N/A;    There were no vitals filed for this visit.   Subjective Assessment - 05/07/20 0934    Subjective "I am doing well!"    Currently in Pain? No/denies                     SLP Short Term Goals - 05/07/20 1004       SLP SHORT TERM GOAL #1   Title Pt will utilize word finding strategies (of a list) given a verbal prompt to increase word finding ability.    Time 6    Period Weeks    Status On-going      SLP SHORT TERM GOAL #2   Title Pt will recall 2 memory strategies to assist with medication and financial management.    Time 6    Period Weeks    Status On-going      SLP SHORT TERM GOAL #3   Title Patient will demo attention to detail in school/work-like material with double checking/self-corrections.    Time 6    Period Weeks    Status On-going      SLP SHORT TERM GOAL #4   Title Patient will complete planning/organization exercises with mod verbal and visual cues.    Time 6    Period Weeks    Status On-going            SLP Long Term Goals - 05/07/20 1005      SLP LONG TERM GOAL #1   Title Patient will participate in restorative and compensatory therapy to improve verbal expression by incorporating strategies to communicate wants and needs  effectively to different conversational partners, maintain safety and participate socially in their functional living environment.    Time 12    Period Weeks    Status On-going      SLP LONG TERM GOAL #2   Title Patient will demonstrate use of memory strategies to schedule activities, recall weekly events and items to maintain safety to participate socially in functional living environment    Time 12    Period Weeks    Status On-going      SLP LONG TERM GOAL #3   Title Patient will increase information processing speed during planning and organization daily living activities to improve safety and awareness in functional living environment    Time 12    Period Weeks    Status New      SLP LONG TERM GOAL #4   Title Patient will develop functional attention skills to effectively attend to and communicate in tasks of daily living in their functional living environment.    Time 12    Period Weeks    Status On-going            Plan -  05/07/20 1003    Clinical Impression Statement Pt is a 45 yo female with hx significant for RCVA. 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 She would like to get back to work and continue pursuing her bachelors degree. Since the CVA, patient reports trouble with recalling words and difficulty holding information in her head. Cognitive-communication was assessed this date using the SLUMS. Patient scored a 17/30 indicating a moderate impairment. Difficulty noted in working memory, short term memory, attention, and generative naming. Informal assessment of receptive and expressive language skills noted rare anomia, but more disorganized thought processes. Given extra time, patient was able to achieve the correct word. SLP rec skilled speech services to address expressive aphasia and cognitive-communication impairment to achieve participation in all ADLs.    Speech Therapy Frequency 2x / week    Duration 12 weeks    Treatment/Interventions Compensatory strategies;Cueing hierarchy;Functional tasks;Patient/family education;Environmental controls;Cognitive reorganization;Multimodal communcation approach;Language facilitation;Compensatory techniques;Internal/external aids;SLP instruction and feedback    Potential to Achieve Goals Good    SLP Home Exercise Plan Word finding    Consulted and Agree with Plan of Care Patient           Patient will benefit from skilled therapeutic intervention in order to improve the following deficits and impairments:   Aphasia  Cognitive communication deficit    Problem List Patient Active Problem List   Diagnosis Date Noted  . Cerebrovascular accident (CVA) due to embolism of anterior cerebral artery (H[facility])   . Encounter for general adult medical examination with abnormal findings 04/13/2020  . Visit for screening mammogram 04/13/2020  . Moderate episode of recurrent major depressive disorder ([facility]) 04/09/2020  .  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 Cerebral thrombosis with cerebral infarction 04/04/2020  . Hypokalemia 04/03/2020  . Left leg weakness 04/02/2020  . Hypertension   . Depression   . Anxiety 11/27/2017  . Vitamin D deficiency 06/07/2017  . History of prior pregnancy with IUGR newborn 06/04/2017  . Anxiety, generalized 02/09/2016    Dorena Bodo, MS [place], CBIS 05/07/2020, 10:14 AM  [facility] [address] [place], [place], [address] Phone: [phone]   Fax:  [phone]   Name: Madelina Sanda MRN: [id number] Date of Birth:  12/19/1975 

## 2020-05-10 ENCOUNTER — Telehealth: Payer: Self-pay | Admitting: Adult Health

## 2020-05-10 ENCOUNTER — Ambulatory Visit: Payer: 59 | Admitting: Speech Pathology

## 2020-05-10 ENCOUNTER — Encounter (HOSPITAL_COMMUNITY): Payer: Self-pay

## 2020-05-10 ENCOUNTER — Ambulatory Visit: Payer: 59

## 2020-05-10 ENCOUNTER — Encounter: Payer: Self-pay | Admitting: Speech Pathology

## 2020-05-10 ENCOUNTER — Emergency Department (HOSPITAL_COMMUNITY)
Admission: EM | Admit: 2020-05-10 | Discharge: 2020-05-10 | Disposition: A | Discharge status: Home / Self Care | Payer: 59 | Attending: Emergency Medicine | Admitting: Emergency Medicine

## 2020-05-10 ENCOUNTER — Other Ambulatory Visit: Payer: Self-pay

## 2020-05-10 ENCOUNTER — Emergency Department (HOSPITAL_COMMUNITY): Payer: 59

## 2020-05-10 DIAGNOSIS — R41841 Cognitive communication deficit: Secondary | ICD-10-CM | POA: Diagnosis not present

## 2020-05-10 DIAGNOSIS — F17210 Nicotine dependence, cigarettes, uncomplicated: Secondary | ICD-10-CM | POA: Insufficient documentation

## 2020-05-10 DIAGNOSIS — I10 Essential (primary) hypertension: Secondary | ICD-10-CM | POA: Insufficient documentation

## 2020-05-10 DIAGNOSIS — R519 Headache, unspecified: Secondary | ICD-10-CM | POA: Insufficient documentation

## 2020-05-10 DIAGNOSIS — Z79899 Other long term (current) drug therapy: Secondary | ICD-10-CM | POA: Insufficient documentation

## 2020-05-10 DIAGNOSIS — R4701 Aphasia: Secondary | ICD-10-CM

## 2020-05-10 DIAGNOSIS — Z8673 Personal history of transient ischemic attack (TIA), and cerebral infarction without residual deficits: Secondary | ICD-10-CM | POA: Insufficient documentation

## 2020-05-10 IMAGING — CT CT HEAD W/O CM
3 series · 16 of 47 positions shown, 19 images · non-contrast
Comparison: Head CTA 04/02/2020.

CLINICAL DATA: 44-year-old female with history of headache.

EXAM:
CT HEAD WITHOUT CONTRAST
TECHNIQUE: Contiguous axial images were obtained from the base of the skull
through the vertex without intravenous contrast.

[Series 2: head wo · axial · 0.43mm/px · z∈[+1448,+1578]mm · 10 of 32 slices shown, 13 images]
[im 3/32  brain]
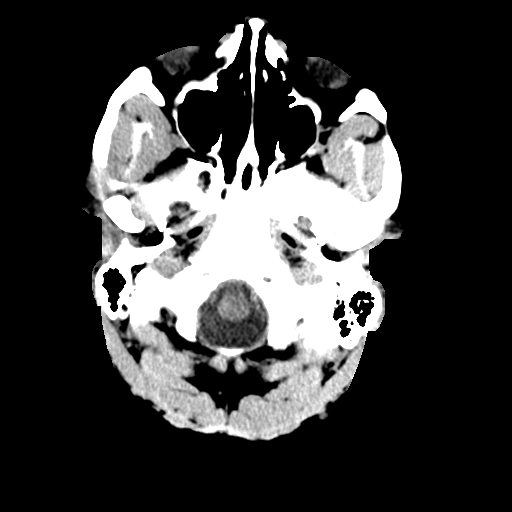
[im 3/32  bone]
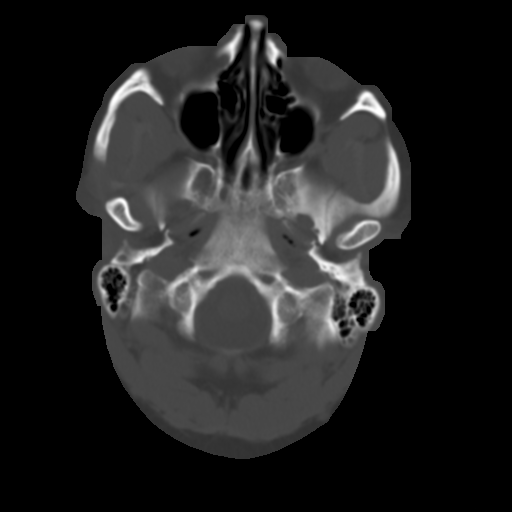
[im 6/32  brain]
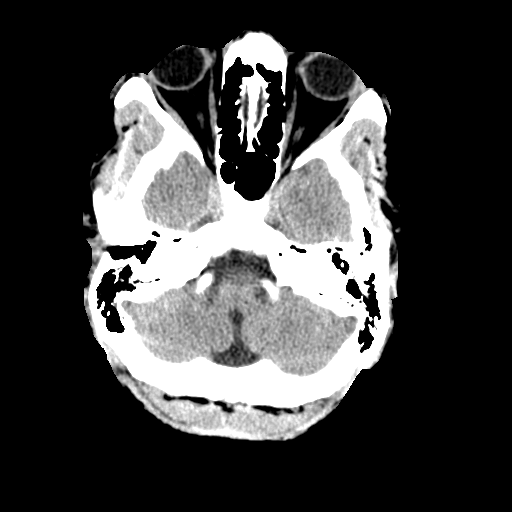
[im 9/32  brain]
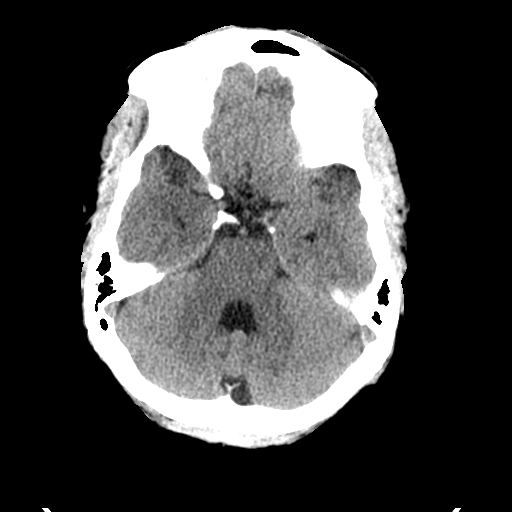
[im 11/32  brain]
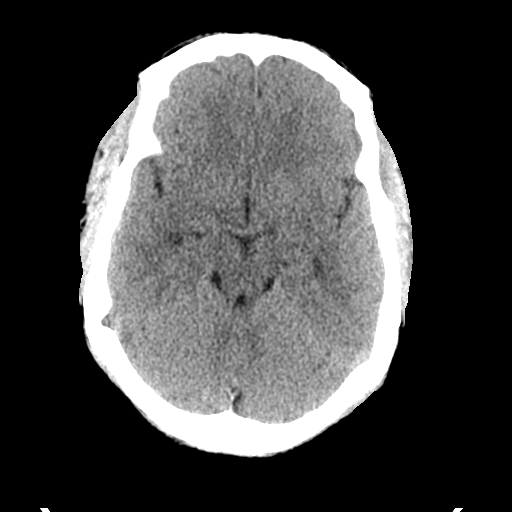
[im 14/32  brain]
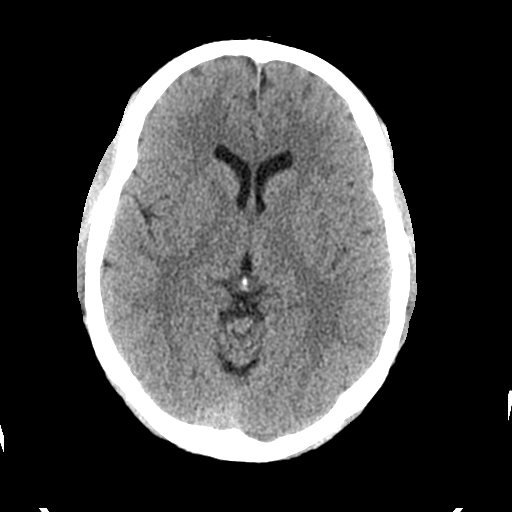
[im 14/32  bone]
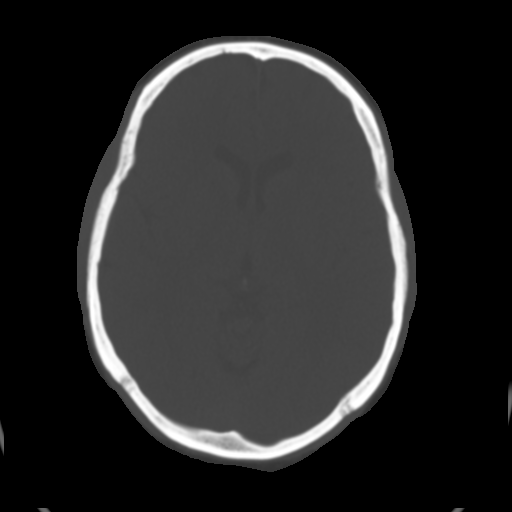
[im 18/32  brain]
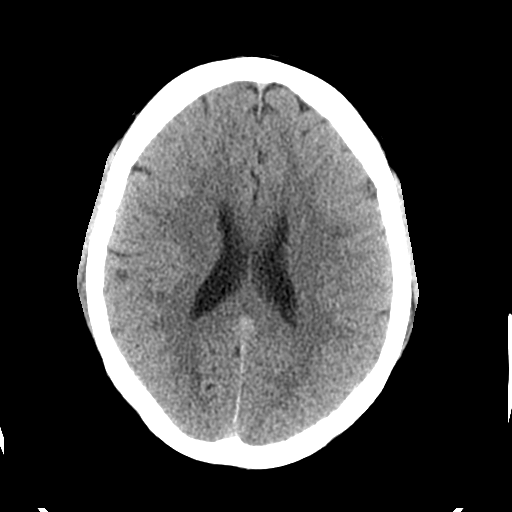
[im 21/32  brain]
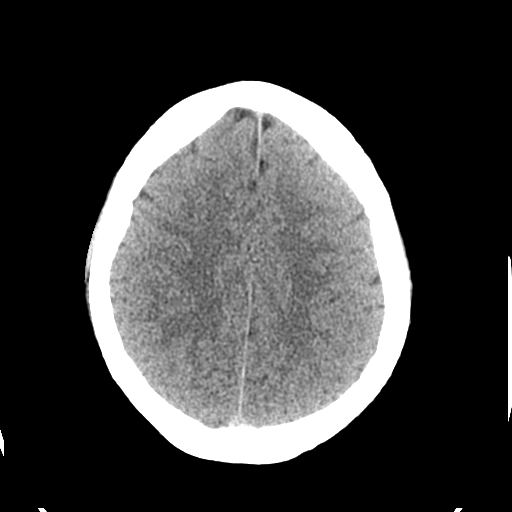
[im 24/32  brain]
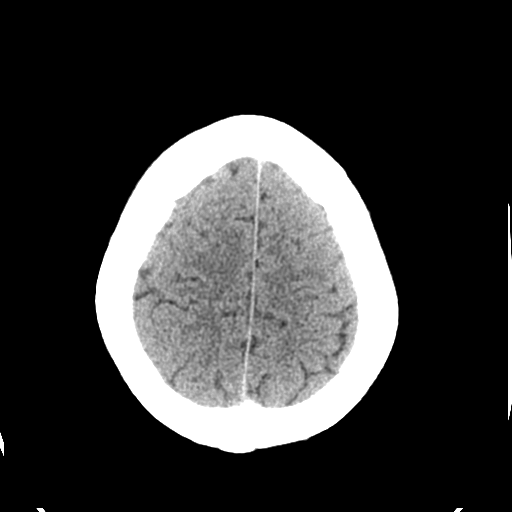
[im 26/32  brain]
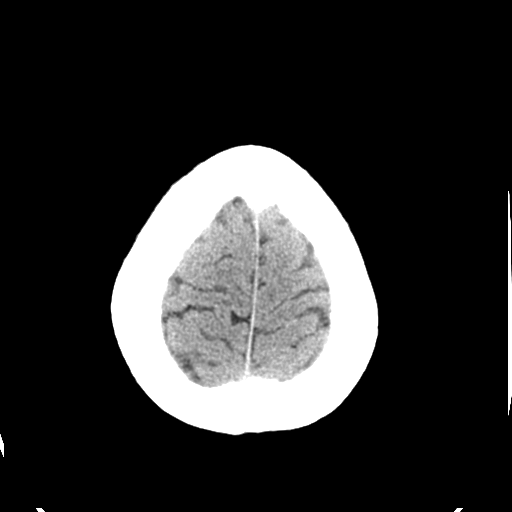
[im 26/32  bone]
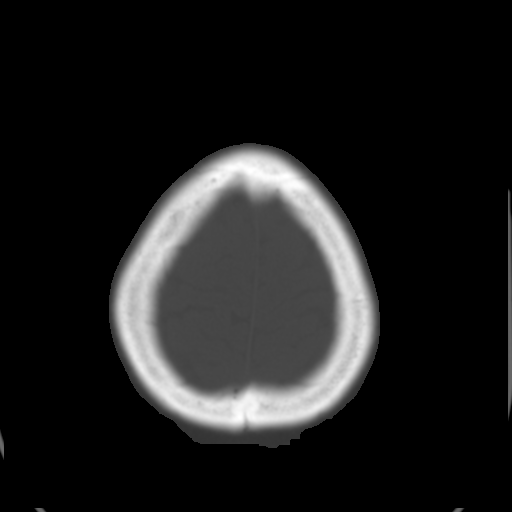
[im 29/32  brain]
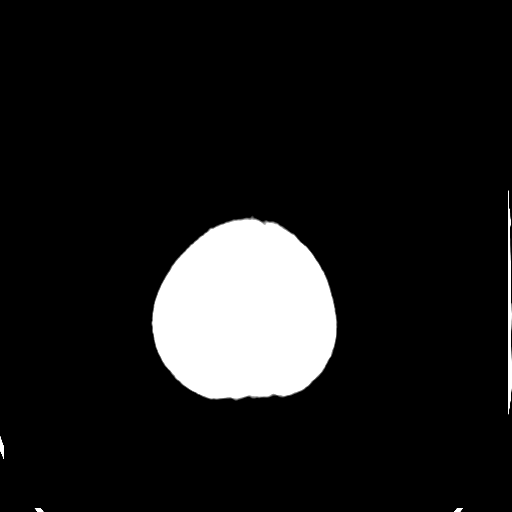

[Series 5: coronal soft tissue · coronal · 0.30mm/px · 3 of 69 slices shown]
[im 23/69  brain]
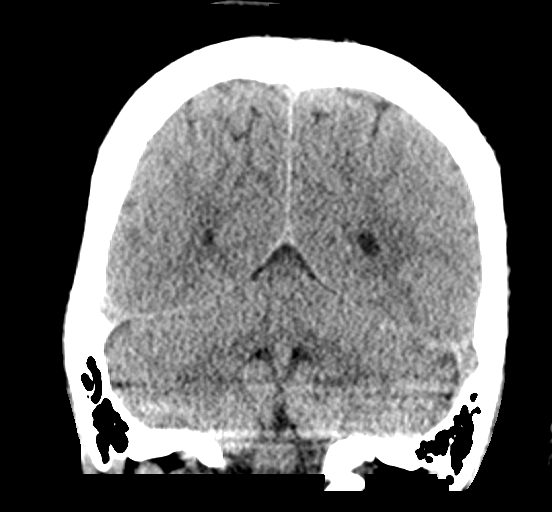
[im 31/69  brain]
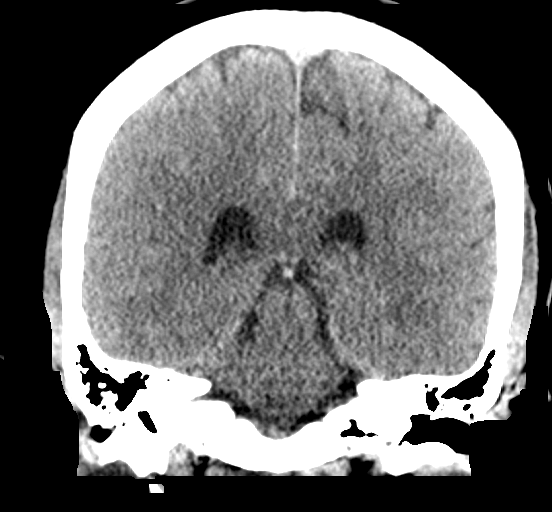
[im 38/69  brain]
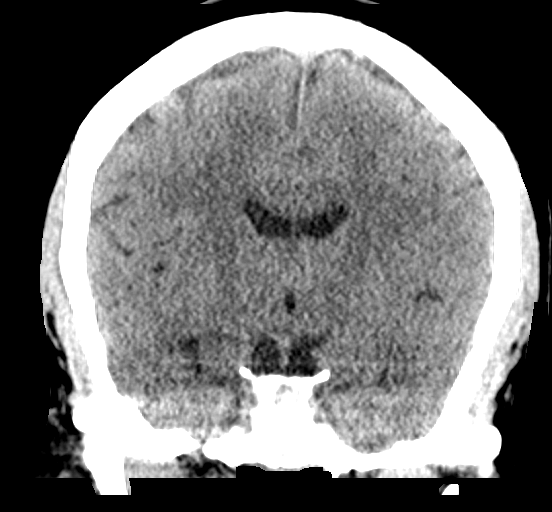

[Series 6: sagittal soft tissue · sagittal · 0.30mm/px · 3 of 54 slices shown]
[im 18/54  brain]
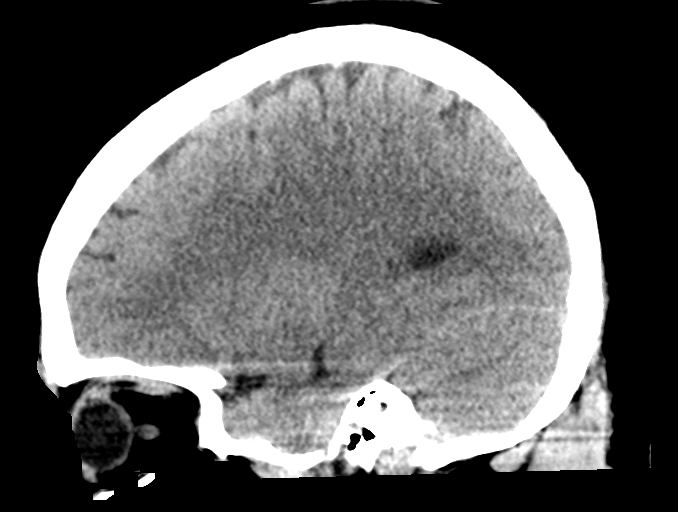
[im 27/54  brain]
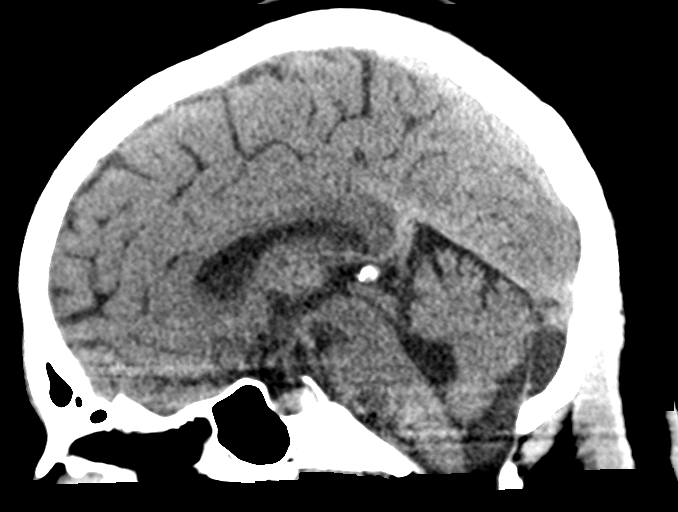
[im 36/54  brain]
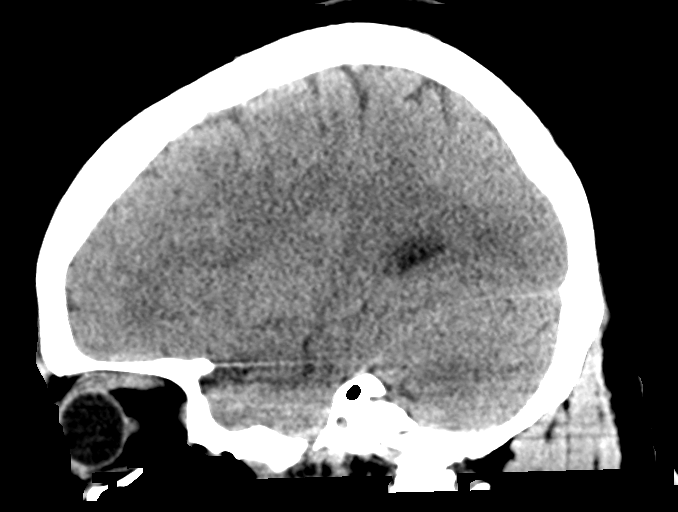

[16 of 47 positions shown; findings below may reference images not displayed]

FINDINGS: Brain: No evidence of acute infarction, hemorrhage, hydrocephalus,
extra-axial collection or mass lesion/mass effect.

Vascular: No hyperdense vessel or unexpected calcification.

Skull: Normal. Negative for fracture or focal lesion.

Sinuses/Orbits: No acute finding.

Other: None.
IMPRESSION: 1. No acute intracranial abnormalities. The appearance of the brain
is normal.

## 2020-05-10 MED ORDER — ACETAMINOPHEN 325 MG PO TABS
650.0000 mg | ORAL_TABLET | Freq: Once | ORAL | Status: AC
  Administered 2020-05-10: 650 mg via ORAL
  Filled 2020-05-10: qty 2

## 2020-05-10 NOTE — Telephone Encounter (Signed)
Pt called, this morning having a faint headache. Want to make sure I'm not having another stroke. When do I go to the hospital. Would like a call from the nurse.

## 2020-05-10 NOTE — Telephone Encounter (Signed)
LMVM for her with message as well.

## 2020-05-10 NOTE — Telephone Encounter (Signed)
Okay to take Tylenol for mild headache.  If she starts to experience any visual changes or new/worsening stroke/TIA symptoms or severe headache she said to proceed to ED for further evaluation

## 2020-05-10 NOTE — Therapy (Signed)
[facility] [address] [place], [place], [address] Phone: [phone]   Fax:  [phone]  Speech Language Pathology Treatment  Patient Details  Name: Tracey [name] MRN: [id number] Date of Birth: 11-May-1975 Referring Provider (SLP): Etta Grandchild   Encounter Date: 05/10/2020   End of Session - 05/10/20 1105    Visit Number 3    Number of Visits 25    Date for SLP Re-Evaluation 08/02/19    SLP Start Time 1020    SLP Stop Time  1105    SLP Time Calculation (min) 45 min    Activity Tolerance Patient tolerated treatment well           Past Medical History:  Diagnosis Date  . Anxiety   . Bronchitis   . Carpal tunnel syndrome on both sides   . Depression   . Family history of adverse reaction to anesthesia    mother had problems waking up from surgery.  . Gestational diabetes mellitus    Normal 2 hr GTT postpartum  . Headache    migraines  . Hypertension   . Oligohydramnios antepartum 11/09/2017  . Panic attack 2017   diagnosed 3 months ago. no meds presently  . Reflux    did not filll prescription    Past Surgical History:  Procedure Laterality Date  . BREAST BIOPSY  right    cyst  . BUBBLE STUDY  04/16/2020   Procedure: BUBBLE STUDY;  Surgeon: Chilton Si, MD;  Location: [facility] ENDOSCOPY;  Service: Cardiovascular;;  . DILATION AND EVACUATION N/A 07/26/2015   Procedure: DILATATION AND EVACUATION;  Surgeon: Brock Bad, MD;  Location: [facility];  Service: Gynecology;  Laterality: N/A;  . TEE WITHOUT CARDIOVERSION N/A 04/16/2020   Procedure: TRANSESOPHAGEAL ECHOCARDIOGRAM (TEE);  Surgeon: Chilton Si, MD;  Location: [facility] ENDOSCOPY;  Service: Cardiovascular;  Laterality: N/A;    There were no vitals filed for this visit.   Subjective Assessment - 05/10/20 1025    Subjective "I can't remember what I did this weekend!"    Currently in Pain? No/denies                 ADULT SLP TREATMENT -  05/10/20 1025      General Information   Behavior/Cognition Alert;Cooperative;Pleasant mood      Treatment Provided   Treatment provided Cognitive-Linquistic      Cognitive-Linquistic Treatment   Treatment focused on Cognition;Aphasia    Skilled Treatment Treatment provided this date focused on semantic feature analysis (SFA) for word finding. Pt was able to use expanding expression tool with minA. She was able to independently navigate EET list to complete word finding task.      Assessment / Recommendations / Plan   Plan Continue with current plan of care      Progression Toward Goals   Progression toward goals Progressing toward goals            SLP Education - 05/10/20 1103    Education Details Aphasia word finding    Person(s) Educated Patient    Methods Explanation;Handout;Demonstration    Comprehension Verbalized understanding            SLP Short Term Goals - 05/10/20 1106      SLP SHORT TERM GOAL #1   Title Pt will utilize word finding strategies (of a list) given a verbal prompt to increase word finding ability.    Time 5    Period Weeks    Status On-going  SLP SHORT TERM GOAL #2   Title Pt will recall 2 memory strategies to assist with medication and financial management.    Time 5    Period Weeks    Status On-going      SLP SHORT TERM GOAL #3   Title Patient will demo attention to detail in school/work-like material with double checking/self-corrections.    Time 5    Period Weeks    Status On-going      SLP SHORT TERM GOAL #4   Title Patient will complete planning/organization exercises with mod verbal and visual cues.    Time 5    Period Weeks    Status On-going            SLP Long Term Goals - 05/10/20 1106      SLP LONG TERM GOAL #1   Title Patient will participate in restorative and compensatory therapy to improve verbal expression by incorporating strategies to communicate wants and needs effectively to different conversational  partners, maintain safety and participate socially in their functional living environment.    Time 11    Period Weeks    Status On-going      SLP LONG TERM GOAL #2   Title Patient will demonstrate use of memory strategies to schedule activities, recall weekly events and items to maintain safety to participate socially in functional living environment    Time 11    Period Weeks    Status On-going      SLP LONG TERM GOAL #3   Title Patient will increase information processing speed during planning and organization daily living activities to improve safety and awareness in functional living environment    Time 11    Period Weeks    Status New      SLP LONG TERM GOAL #4   Title Patient will develop functional attention skills to effectively attend to and communicate in tasks of daily living in their functional living environment.    Time 11    Period Weeks    Status On-going            Plan - 05/10/20 1105    Clinical Impression Statement Pt is a 45 yo female with hx significant for RCVA. 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 She would like to get back to work and continue pursuing her bachelors degree. Since the CVA, patient reports trouble with recalling words and difficulty holding information in her head. Cognitive-communication was assessed this date using the SLUMS. Patient scored a 17/30 indicating a moderate impairment. Difficulty noted in working memory, short term memory, attention, and generative naming. Informal assessment of receptive and expressive language skills noted rare anomia, but more disorganized thought processes. Given extra time, patient was able to achieve the correct word. SLP rec skilled speech services to address expressive aphasia and cognitive-communication impairment to achieve participation in all ADLs.    Speech Therapy Frequency 2x / week    Duration 12 weeks    Treatment/Interventions Compensatory  strategies;Cueing hierarchy;Functional tasks;Patient/family education;Environmental controls;Cognitive reorganization;Multimodal communcation approach;Language facilitation;Compensatory techniques;Internal/external aids;SLP instruction and feedback    Potential to Achieve Goals Good    SLP Home Exercise Plan Word finding    Consulted and Agree with Plan of Care Patient           Patient will benefit from skilled therapeutic intervention in order to improve the following deficits and impairments:   Aphasia  Cognitive communication deficit  Problem List Patient Active Problem List   Diagnosis Date Noted  . Cerebrovascular accident (CVA) due to embolism of anterior cerebral artery ([facility])   . Encounter for general adult medical examination with abnormal findings 04/13/2020  . Visit for screening mammogram 04/13/2020  . Moderate episode of recurrent major depressive disorder ([facility]) 04/09/2020  . 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 Cerebral thrombosis with cerebral infarction 04/04/2020  . Hypokalemia 04/03/2020  . Left leg weakness 04/02/2020  . Hypertension   . Depression   . Anxiety 11/27/2017  . Vitamin D deficiency 06/07/2017  . History of prior pregnancy with IUGR newborn 06/04/2017  . Anxiety, generalized 02/09/2016    Dorena Bodo, MS [place], CBIS 05/10/2020, 11:08 AM  [facility] [address] [place], [place], [address] Phone: [phone]   Fax:  [phone]   Name: Tracey [name] MRN: [id number] Date of Birth: [birth year]

## 2020-05-10 NOTE — Patient Instructions (Signed)
Continue working on [employer] with finding words.   Provided patient with "[facility]: Practice at home."

## 2020-05-10 NOTE — ED Triage Notes (Signed)
Pt arrived via walk in, states she started with a headache about an hour ago. No other sx. States she just wanted to make sure there was nothing wrong with her. Recent stroke in January. No focal deficit. Grip strengths equal. No facial droop.

## 2020-05-10 NOTE — Telephone Encounter (Signed)
Spoke to patient.  She noted headache this morning. She noted it early morning hours.  Around level 3.  Has not gotten any worse and she has not taken any over-the-counter medications. She is not having any other symptoms. She has not done any type of exertion. Only on aspirn 81mg  po daily. Please advise.

## 2020-05-10 NOTE — ED Provider Notes (Signed)
[facility] DEPT Provider Note   CSN: [id number] Arrival date & time: 05/10/20  1347     History Chief Complaint  Patient presents with  . Headache    Tracey [name] is a 45 y.o. female.  HPI Patient presents with headache. Dull in front of her head. Feels like a spine rise. No vision changes. Occasionally gets headaches but does not tend to get them very frequently. The patient's history states that she has migraines but states she does not know of any migraine diagnosis. Did however have a stroke back in December. Unknown source but does have a PFO. Do to have closure evaluation. Also wearing a monitor. Did not have a headache with that episode of did have some mild neck pain. Had left-sided weakness. States that is improving. No trauma. No fevers. No vision changes or wiggly lines in the vision.    Past Medical History:  Diagnosis Date  . Anxiety   . Bronchitis   . Carpal tunnel syndrome on both sides   . Depression   . Family history of adverse reaction to anesthesia    mother had problems waking up from surgery.  . Gestational diabetes mellitus    Normal 2 hr GTT postpartum  . Headache    migraines  . Hypertension   . Oligohydramnios antepartum 11/09/2017  . Panic attack 2017   diagnosed 3 months ago. no meds presently  . Reflux    did not filll prescription    Patient Active Problem List   Diagnosis Date Noted  . Cerebrovascular accident (CVA) due to embolism of anterior cerebral artery ([place])   . Encounter for general adult medical examination with abnormal findings 04/13/2020  . Visit for screening mammogram 04/13/2020  . Moderate episode of recurrent major depressive disorder ([place]) 04/09/2020  . Cerebrovascular accident (CVA) due to thrombosis of right anterior cerebral artery ([place]) 04/09/2020  . Cerebral thrombosis with cerebral infarction 04/04/2020  . Hypokalemia 04/03/2020  . Left leg weakness 04/02/2020  . Hypertension   .  Depression   . Anxiety 11/27/2017  . Vitamin D deficiency 06/07/2017  . History of prior pregnancy with IUGR newborn 06/04/2017  . Anxiety, generalized 02/09/2016    Past Surgical History:  Procedure Laterality Date  . BREAST BIOPSY  right    cyst  . BUBBLE STUDY  04/16/2020   Procedure: BUBBLE STUDY;  Surgeon: Skeet Latch, MD;  Location: [facility];  Service: Cardiovascular;;  . DILATION AND EVACUATION N/A 07/26/2015   Procedure: DILATATION AND EVACUATION;  Surgeon: Shelly Bombard, MD;  Location: [facility];  Service: Gynecology;  Laterality: N/A;  . TEE WITHOUT CARDIOVERSION N/A 04/16/2020   Procedure: TRANSESOPHAGEAL ECHOCARDIOGRAM (TEE);  Surgeon: Skeet Latch, MD;  Location: [facility] END[facility]COPY;  Service: Cardiovascular;  Laterality: N/A;     OB History    Gravida  5   Para  3   Term  2   Preterm  1   AB  2   Living  3     SAB  2   IAB  0   Ectopic  0   Multiple  0   Live Births  3           Family History  Problem Relation Age of Onset  . Hypertension Mother   . Diabetes Maternal Aunt   . Diabetes Maternal Uncle     Social History   Tobacco Use  . Smoking status: Current Some Day Smoker    Packs/day: 0.25  Types: Cigarettes  . Smokeless tobacco: Never Used  . Tobacco comment: 2 cig/day  Vaping Use  . Vaping Use: Never used  Substance Use Topics  . Alcohol use: No    Alcohol/week: 0.0 standard drinks  . Drug use: No    Home Medications Prior to Admission medications   Medication Sig Start Date End Date Taking? Authorizing Provider  buPROPion (WELLBUTRIN) 75 MG tablet Take 75 mg by mouth 2 (two) times daily. 03/23/20   [provider]  COD LIVER OIL PO Take 1 capsule by mouth daily.    [provider]  hydrOXYzine (VISTARIL) 25 MG capsule Take 25 mg by mouth daily as needed. 03/23/20   [provider]  lisinopril (ZESTRIL) 20 MG tablet Take 1.5 tablets (30 mg total) by mouth daily. 04/13/20 07/12/20  Freada Bergeron, MD  Multiple Vitamins-Minerals (MULTIVITAMIN WITH MINERALS) tablet Take 1 tablet by mouth daily.    [provider]  Probiotic Product (PROBIOTIC DAILY PO) Take 1 tablet by mouth daily.     [provider]  rosuvastatin (CRESTOR) 10 MG tablet Take 1 tablet (10 mg total) by mouth daily. 04/04/20 05/04/20  Arrien, Jimmy Picket, MD  Vilazodone HCl (VIIBRYD STARTER PACK) 10 & 20 MG KIT Take 1 tablet by mouth daily. 04/09/20   Janith Lima, MD    Allergies    Patient has no known allergies.  Review of Systems   Review of Systems  Constitutional: Negative for appetite change and fever.  HENT: Negative for congestion.   Eyes: Negative for visual disturbance.  Respiratory: Negative for shortness of breath.   Gastrointestinal: Negative for nausea and vomiting.  Genitourinary: Negative for flank pain.  Musculoskeletal: Positive for neck pain.  Skin: Negative for rash.  Neurological: Positive for headaches. Negative for weakness and numbness.  Psychiatric/Behavioral: Negative for confusion.    Physical Exam Updated Vital Signs BP 132/72   Pulse (!) 58   Temp 98.3 F (36.8 C) (Oral)   Resp 15   SpO2 100%   Physical Exam Vitals reviewed.  Constitutional:      Appearance: She is well-developed.  HENT:     Head: Atraumatic.  Eyes:     Extraocular Movements: Extraocular movements intact.     Pupils: Pupils are equal, round, and reactive to light.  Cardiovascular:     Rate and Rhythm: Normal rate and regular rhythm.  Pulmonary:     Breath sounds: No wheezing or rhonchi.  Abdominal:     Tenderness: There is no abdominal tenderness.  Musculoskeletal:     Cervical back: Neck supple.  Skin:    General: Skin is warm.  Neurological:     Mental Status: She is alert. Mental status is at baseline.  Psychiatric:        Mood and Affect: Mood normal.        Behavior: Behavior normal.     ED Results / Procedures / Treatments   Labs (all labs ordered are  listed, but only abnormal results are displayed) Labs Reviewed - No data to display  EKG None  Radiology CT Head Wo Contrast  Result Date: 05/10/2020 CLINICAL DATA:  45 year old female with history of headache. EXAM: CT HEAD WITHOUT CONTRAST TECHNIQUE: Contiguous axial images were obtained from the base of the skull through the vertex without intravenous contrast. COMPARISON:  Head CTA 04/02/2020. FINDINGS: Brain: No evidence of acute infarction, hemorrhage, hydrocephalus, extra-axial collection or mass lesion/mass effect. Vascular: No hyperdense vessel or unexpected calcification. Skull: Normal.  Negative for fracture or focal lesion. Sinuses/Orbits: No acute finding. Other: None. IMPRESSION: 1. No acute intracranial abnormalities. The appearance of the brain is normal. Electronically Signed   By: Vinnie Langton M.D.   On: 05/10/2020 19:32    Procedures Procedures   Medications Ordered in ED Medications  acetaminophen (TYLENOL) tablet 650 mg (650 mg Oral Given 05/10/20 1744)    ED Course  I have reviewed the triage vital signs and the nursing notes.  Pertinent labs & imaging results that were available during my care of the patient were reviewed by me and considered in my medical decision making (see chart for details).    MDM Rules/Calculators/A&P                          Patient with headache.  Dull frontal.  No neuro deficits.  Has had a previous and somewhat recent ischemic stroke.  No bleeding at this time.  Only on aspirin therapy, not dual platelet anymore.  Nonfocal exam.  Does not appear to be wanting a worrisome headache syndromes.  Patient was worried this could be another stroke.  I doubt that at this point.  Head CT done and reassuring.  Discharge home.  Outpatient follow-up PCP or neurology Final Clinical Impression(s) / ED Diagnoses Final diagnoses:  Generalized headache    Rx / DC Orders ED Discharge Orders    None       Davonna Belling, MD 05/10/20  2337

## 2020-05-11 ENCOUNTER — Telehealth: Payer: Self-pay | Admitting: *Deleted

## 2020-05-11 NOTE — Telephone Encounter (Signed)
Completed and placed in out box.  Thank you. 

## 2020-05-11 NOTE — Telephone Encounter (Signed)
To MR. 

## 2020-05-11 NOTE — Telephone Encounter (Signed)
Form completed to JM/NP for review and completion and signature.

## 2020-05-12 ENCOUNTER — Telehealth: Payer: Self-pay | Admitting: Adult Health

## 2020-05-12 NOTE — Telephone Encounter (Signed)
Patient's disability and leave statement was faxed

## 2020-05-14 ENCOUNTER — Ambulatory Visit: Payer: 59

## 2020-05-14 ENCOUNTER — Ambulatory Visit: Payer: 59 | Admitting: Speech Pathology

## 2020-05-17 ENCOUNTER — Ambulatory Visit: Payer: 59

## 2020-05-17 ENCOUNTER — Ambulatory Visit: Payer: 59 | Admitting: Speech Pathology

## 2020-05-17 ENCOUNTER — Encounter: Payer: Self-pay | Admitting: Speech Pathology

## 2020-05-17 ENCOUNTER — Other Ambulatory Visit: Payer: Self-pay

## 2020-05-17 DIAGNOSIS — R4701 Aphasia: Secondary | ICD-10-CM

## 2020-05-17 DIAGNOSIS — R41841 Cognitive communication deficit: Secondary | ICD-10-CM

## 2020-05-17 NOTE — Therapy (Signed)
[facility] [address] [place], [place], [address] Phone: [phone]   Fax:  [phone]  Speech Language Pathology Treatment  Patient Details  Name: Tracey [name] MRN: [id number] Date of Birth: 1975/10/01 Referring Provider (SLP): Etta Grandchild   Encounter Date: 05/17/2020   End of Session - 05/17/20 1056    Visit Number 4    Number of Visits 25    Date for SLP Re-Evaluation 08/02/19    SLP Start Time 1015    SLP Stop Time  1100    SLP Time Calculation (min) 45 min    Activity Tolerance Patient tolerated treatment well           Past Medical History:  Diagnosis Date  . Anxiety   . Bronchitis   . Carpal tunnel syndrome on both sides   . Depression   . Family history of adverse reaction to anesthesia    mother had problems waking up from surgery.  . Gestational diabetes mellitus    Normal 2 hr GTT postpartum  . Headache    migraines  . Hypertension   . Oligohydramnios antepartum 11/09/2017  . Panic attack 2017   diagnosed 3 months ago. no meds presently  . Reflux    did not filll prescription    Past Surgical History:  Procedure Laterality Date  . BREAST BIOPSY  right    cyst  . BUBBLE STUDY  04/16/2020   Procedure: BUBBLE STUDY;  Surgeon: Chilton Si, MD;  Location: [facility] ENDOSCOPY;  Service: Cardiovascular;;  . DILATION AND EVACUATION N/A 07/26/2015   Procedure: DILATATION AND EVACUATION;  Surgeon: Brock Bad, MD;  Location: [facility];  Service: Gynecology;  Laterality: N/A;  . TEE WITHOUT CARDIOVERSION N/A 04/16/2020   Procedure: TRANSESOPHAGEAL ECHOCARDIOGRAM (TEE);  Surgeon: Chilton Si, MD;  Location[facility] END[facility]COPY;  Service: Cardiovascular;  Laterality: N/A;    There were no vitals filed for this visit.   Subjective Assessment - 05/17/20 1049    Subjective "I had a stressful weekend."    Currently in Pain? No/denies                 ADULT SLP TREATMENT - 05/17/20 0001       General Information   Behavior/Cognition Alert;Cooperative;Pleasant mood      Treatment Provided   Treatment provided Cognitive-Linquistic      Cognitive-Linquistic Treatment   Treatment focused on Aphasia    Skilled Treatment Treatment provided this date focused on the communication breakdowns that occurred over the weekend. SLP provided patient with handouts and explained communication tips for the communication partner. Also suggested pt husband come in for education.      Assessment / Recommendations / Plan   Plan Continue with current plan of care      Progression Toward Goals   Progression toward goals Progressing toward goals            SLP Education - 05/17/20 1055    Education Details Provided edu on communication tips.    Person(s) Educated Patient    Methods Explanation;Demonstration    Comprehension Verbalized understanding            SLP Short Term Goals - 05/17/20 1057      SLP SHORT TERM GOAL #1   Title Pt will utilize word finding strategies (of a list) given a verbal prompt to increase word finding ability.    Time 5    Period Weeks    Status On-going  SLP SHORT TERM GOAL #2   Title Pt will recall 2 memory strategies to assist with medication and financial management.    Time 5    Period Weeks    Status On-going      SLP SHORT TERM GOAL #3   Title Patient will demo attention to detail in school/work-like material with double checking/self-corrections.    Time 5    Period Weeks    Status On-going      SLP SHORT TERM GOAL #4   Title Patient will complete planning/organization exercises with mod verbal and visual cues.    Time 5    Period Weeks    Status On-going            SLP Long Term Goals - 05/17/20 1057      SLP LONG TERM GOAL #1   Title Patient will participate in restorative and compensatory therapy to improve verbal expression by incorporating strategies to communicate wants and needs effectively to different conversational  partners, maintain safety and participate socially in their functional living environment.    Time 11    Period Weeks    Status On-going      SLP LONG TERM GOAL #2   Title Patient will demonstrate use of memory strategies to schedule activities, recall weekly events and items to maintain safety to participate socially in functional living environment    Time 11    Period Weeks    Status On-going      SLP LONG TERM GOAL #3   Title Patient will increase information processing speed during planning and organization daily living activities to improve safety and awareness in functional living environment    Time 11    Period Weeks    Status New      SLP LONG TERM GOAL #4   Title Patient will develop functional attention skills to effectively attend to and communicate in tasks of daily living in their functional living environment.    Time 11    Period Weeks    Status On-going            Plan - 05/17/20 1057    Clinical Impression Statement Pt is a 45 yo female with hx significant for RCVA. 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 She would like to get back to work and continue pursuing her bachelors degree. Since the CVA, patient reports trouble with recalling words and difficulty holding information in her head. Cognitive-communication was assessed this date using the SLUMS. Patient scored a 17/30 indicating a moderate impairment. Difficulty noted in working memory, short term memory, attention, and generative naming. Informal assessment of receptive and expressive language skills noted rare anomia, but more disorganized thought processes. Given extra time, patient was able to achieve the correct word. SLP rec skilled speech services to address expressive aphasia and cognitive-communication impairment to achieve participation in all ADLs.    Speech Therapy Frequency 2x / week    Duration 12 weeks    Treatment/Interventions Compensatory  strategies;Cueing hierarchy;Functional tasks;Patient/family education;Environmental controls;Cognitive reorganization;Multimodal communcation approach;Language facilitation;Compensatory techniques;Internal/external aids;SLP instruction and feedback    Potential to Achieve Goals Good    SLP Home Exercise Plan Word finding    Consulted and Agree with Plan of Care Patient           Patient will benefit from skilled therapeutic intervention in order to improve the following deficits and impairments:   Aphasia  Cognitive communication deficit  Problem List Patient Active Problem List   Diagnosis Date Noted  . Cerebrovascular accident (CVA) due to embolism of anterior cerebral artery ([facility])   . Encounter for general adult medical examination with abnormal findings 04/13/2020  . Visit for screening mammogram 04/13/2020  . Moderate episode of recurrent major depressive disorder ([facility]) 04/09/2020  . 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 Cerebral thrombosis with cerebral infarction 04/04/2020  . Hypokalemia 04/03/2020  . Left leg weakness 04/02/2020  . Hypertension   . Depression   . Anxiety 11/27/2017  . Vitamin D deficiency 06/07/2017  . History of prior pregnancy with IUGR newborn 06/04/2017  . Anxiety, generalized 02/09/2016    Dorena Bodo MS, [place], CBIS  05/17/2020, 10:58 AM  [facility] [address] [place], [place], [address] Phone: [phone]   Fax:  [phone]   Name: Tracey [name] MRN: [id number] Date of Birth: 05-Jun-1975

## 2020-05-17 NOTE — Patient Instructions (Signed)
Provide communication tips to husband and bring him to therapy!

## 2020-05-20 NOTE — Telephone Encounter (Signed)
Late entry-- patient aware of referral placed  Appointment with Dr Excell Seltzer 3/11

## 2020-05-21 ENCOUNTER — Encounter: Payer: Self-pay | Admitting: Speech Pathology

## 2020-05-21 ENCOUNTER — Ambulatory Visit: Payer: 59 | Attending: Family Medicine

## 2020-05-21 ENCOUNTER — Other Ambulatory Visit: Payer: Self-pay | Admitting: Internal Medicine

## 2020-05-21 ENCOUNTER — Other Ambulatory Visit: Payer: Self-pay

## 2020-05-21 ENCOUNTER — Ambulatory Visit: Payer: 59 | Attending: Internal Medicine | Admitting: Speech Pathology

## 2020-05-21 DIAGNOSIS — R2689 Other abnormalities of gait and mobility: Secondary | ICD-10-CM | POA: Insufficient documentation

## 2020-05-21 DIAGNOSIS — R4701 Aphasia: Secondary | ICD-10-CM | POA: Diagnosis present

## 2020-05-21 DIAGNOSIS — R262 Difficulty in walking, not elsewhere classified: Secondary | ICD-10-CM | POA: Insufficient documentation

## 2020-05-21 DIAGNOSIS — M6281 Muscle weakness (generalized): Secondary | ICD-10-CM | POA: Insufficient documentation

## 2020-05-21 DIAGNOSIS — R41841 Cognitive communication deficit: Secondary | ICD-10-CM | POA: Diagnosis present

## 2020-05-21 DIAGNOSIS — Z1231 Encounter for screening mammogram for malignant neoplasm of breast: Secondary | ICD-10-CM

## 2020-05-21 DIAGNOSIS — R2681 Unsteadiness on feet: Secondary | ICD-10-CM | POA: Diagnosis present

## 2020-05-21 NOTE — Therapy (Signed)
[facility] [address] [place], [place], [address] Phone: [phone]   Fax:  [phone]  Physical Therapy Treatment  Patient Details  Name: Tracey [name] MRN: [id number] Date of Birth: 08/10/1975 Referring Provider (PT): Referred by Tawni Millers, MD. Followed Frann Rider, NP   Encounter Date: 05/21/2020   PT End of Session - 05/21/20 0848    Visit Number 7    Number of Visits 13    Date for PT Re-Evaluation 07/11/20   POC for 6 weeks, Cert for 90 days   Authorization Type [employer]    PT Start Time [phone]    PT Stop Time 0929    PT Time Calculation (min) 42 min    Activity Tolerance Patient tolerated treatment well;Patient limited by fatigue    Behavior During Therapy [facility] for tasks assessed/performed           Past Medical History:  Diagnosis Date  . Anxiety   . Bronchitis   . Carpal tunnel syndrome on both sides   . Depression   . Family history of adverse reaction to anesthesia    mother had problems waking up from surgery.  . Gestational diabetes mellitus    Normal 2 hr GTT postpartum  . Headache    migraines  . Hypertension   . Oligohydramnios antepartum 11/09/2017  . Panic attack 2017   diagnosed 3 months ago. no meds presently  . Reflux    did not filll prescription    Past Surgical History:  Procedure Laterality Date  . BREAST BIOPSY  right    cyst  . BUBBLE STUDY  04/16/2020   Procedure: BUBBLE STUDY;  Surgeon: Skeet Latch, MD;  Location: [place];  Service: Cardiovascular;;  . DILATION AND EVACUATION N/A 07/26/2015   Procedure: DILATATION AND EVACUATION;  Surgeon: Shelly Bombard, MD;  Location: [facility];  Service: Gynecology;  Laterality: N/A;  . TEE WITHOUT CARDIOVERSION N/A 04/16/2020   Procedure: TRANSESOPHAGEAL ECHOCARDIOGRAM (TEE);  Surgeon: Skeet Latch, MD;  Location: [place];  Service: Cardiovascular;  Laterality: N/A;    There were no vitals filed for this  visit.   Subjective Assessment - 05/21/20 0850    Subjective Reports she continues to exercise. Patient reports that she continues to have some right hip pain.    Pertinent History HTN, depression, anxiety, Headaches, Carpal Tunnel (Bilateral)    Limitations Standing;Walking    How long can you stand comfortably? 10 minutes.    Patient Stated Goals Get Back to walking Normal; Build Endurance    Currently in Pain? No/denies             [facility] Adult PT Treatment/Exercise - 05/21/20 0001      Transfers   Transfers Sit to Stand;Stand to Sit    Sit to Stand 5: Supervision    Stand to Sit 5: Supervision    Comments completed sit <> stand x 10 reps without UE support. Added to HEP.      Ambulation/Gait   Ambulation/Gait Yes    Ambulation/Gait Assistance 5: Supervision    Ambulation/Gait Assistance Details into/out of therapy session with activities without AD    Ambulation Distance (Feet) --   clinic distances   Assistive device None    Gait Pattern Step-through pattern    Ambulation Surface Level;Indoor      Exercises   Exercises Knee/Hip;Other Exercises    Other Exercises  completed review/update of current HEP see below for details:      Knee/Hip  Exercises: Aerobic   Other Aerobic Completed SciFit on Level 2.0 with BUE/BLE (primarily just BLE) x 6 minutes for improved activity tolerance/strengthening/endurance.           Completed all of the following exercises as updated HEP. Patient tolerating well with intermittent rest breaks. PT provided red theraband for completion:   Access Code: [email or web address] URL: [email or web address] Date: 05/21/2020 Prepared by: Baldomero Lamy  Exercises Sit to Stand Without Arm Support - 1 x daily - 5 x weekly - 2 sets - 10 reps Sitting Knee Extension with Resistance - 2 x daily - 5 x weekly - 2 sets - 10 reps Seated Hamstring Curl with Anchored Resistance - 2 x daily - 5 x weekly - 2 sets - 10 reps Supine Bridge - 1 x daily - 5 x  weekly - 2 sets - 5 reps Small Range Straight Leg Raise - 1 x daily - 5 x weekly - 2 sets - 5 reps Standing Hip Abduction with Counter Support - 1 x daily - 5 x weekly - 2 sets - 10 reps       PT Education - 05/21/20 0927    Education Details educated on updated HEP    Person(s) Educated Patient    Methods Explanation;Demonstration;Handout    Comprehension Verbalized understanding;Returned demonstration            PT Short Term Goals - 05/05/20 0808      PT SHORT TERM GOAL #1   Title Patient will be independent with inital Strength/Balance HEP (all STGs due: 05/03/20)    Baseline reports independence, currently completing daily    Time 3    Period Weeks    Status Achieved    Target Date 05/03/20      PT SHORT TERM GOAL #2   Title Patient will improve 5x sit <> stand to </= 12 seconds to demonstrate reduced fall risk    Baseline 14.22 secs; 12.91 secs with UE support    Time 3    Period Weeks    Status Not Met      PT SHORT TERM GOAL #3   Title Patient will undergo endurance assessment (3MWT vs. 6MWT) and LTG to be written if appropriate    Baseline 309' on 04/19/20 - LTG written as appropriate.    Time 3    Period Weeks    Status Achieved             PT Long Term Goals - 04/19/20 1133      PT LONG TERM GOAL #1   Title Patient will be independent with Final HEP for balance/strengthening (All LTGs Due: 05/24/20)    Baseline no HEP established    Time 6    Period Weeks    Status New      PT LONG TERM GOAL #2   Title Patient will improve Berg Balance to >/= 48/56 to demonstrate reduced fall risk and improved balance    Baseline 43/56    Time 6    Period Weeks    Status New      PT LONG TERM GOAL #3   Title Patient will improve gait speed to >/= 2.5 ft/sec to demonstrate improved community mobility    Baseline 1.75 ft/sec    Time 6    Period Weeks    Status New      PT LONG TERM GOAL #4   Title Patient will improve TUG to </= 12 seconds to demonstrate  reduced fall risk  Baseline 14.59 secs    Time 6    Period Weeks    Status New      PT LONG TERM GOAL #5   Title Pt will improve 3MWT by at least 200' in order to demo improved walking endurance with no rest breaks.    Baseline 309' with 2 seated rest breaks    Time 6    Period Weeks    Status Revised                 Plan - 05/21/20 0854    Clinical Impression Statement Continued focus on LLE > RLE strengthening today with patient tolerating well, continues to require intermittent rest breaks due to fatigue. Updated HEP as patient is progressing well. Patient would like to continue PT at [facility] at this time. Will continue to progress toward all LTGs.    Personal Factors and Comorbidities Comorbidity 3+    Comorbidities HTN, depression, anxiety, Headaches, Carpal Tunnel (Bilateral)    Examination-Activity Limitations Stairs;Stand;Locomotion Level;Caring for Others    Examination-Participation Restrictions Cleaning;Occupation;Community Activity;Personal Finances    Stability/Clinical Decision Making Evolving/Moderate complexity    Rehab Potential Good    PT Frequency 2x / week    PT Duration 6 weeks    PT Treatment/Interventions ADLs/Self Care Home Management;Aquatic Therapy;Cryotherapy;Electrical Stimulation;Moist Heat;DME Instruction;Stair training;Gait training;Functional mobility training;Therapeutic activities;Therapeutic exercise;Balance training;Neuromuscular re-education;Patient/family education;Orthotic Fit/Training;Manual techniques;Passive range of motion;Vestibular    PT Next Visit Plan Will need LTG check and Re-Cert. ncorporate balance and strengthening tasks into fuctional activities, continue with endurance training, focus on LLE. SciFit/NuStep for endurance/strength    Consulted and Agree with Plan of Care Patient           Patient will benefit from skilled therapeutic intervention in order to improve the following deficits and impairments:   Abnormal gait,Decreased balance,Decreased endurance,Decreased mobility,Difficulty walking,Impaired sensation,Pain,Decreased strength,Decreased activity tolerance  Visit Diagnosis: Difficulty in walking, not elsewhere classified  Other abnormalities of gait and mobility  Muscle weakness (generalized)  Unsteadiness on feet     Problem List Patient Active Problem List   Diagnosis Date Noted  . Cerebrovascular accident (CVA) due to embolism of anterior cerebral artery ([place])   . Encounter for general adult medical examination with abnormal findings 04/13/2020  . Visit for screening mammogram 04/13/2020  . Moderate episode of recurrent major depressive disorder ([place]) 04/09/2020  . Cerebrovascular accident (CVA) due to thrombosis of right anterior cerebral artery ([place]) 04/09/2020  . Cerebral thrombosis with cerebral infarction 04/04/2020  . Hypokalemia 04/03/2020  . Left leg weakness 04/02/2020  . Hypertension   . Depression   . Anxiety 11/27/2017  . Vitamin D deficiency 06/07/2017  . History of prior pregnancy with IUGR newborn 06/04/2017  . Anxiety, generalized 02/09/2016    Jones Bales, PT, DPT 05/21/2020, 9:29 AM  [facility] [address] [place], [place], [address] Phone: [phone]   Fax:  [phone]  Name: Paetyn Pietrzak MRN: [id number] Date of Birth: 05/04/1975

## 2020-05-21 NOTE — Therapy (Signed)
[facility] [address] [place], [place], [address] Phone: [phone]   Fax:  [phone]  Speech Language Pathology Treatment  Patient Details  Name: Tracey [name] MRN: [id number] Date of Birth: [birth year] Referring Provider (SLP): Etta Grandchild   Encounter Date: 05/21/2020   End of Session - 05/21/20 1109    Visit Number 5    Number of Visits 25    Date for SLP Re-Evaluation 08/02/19    SLP Start Time 1017    SLP Stop Time  1104    SLP Time Calculation (min) 47 min    Activity Tolerance Patient tolerated treatment well;Patient limited by fatigue           Past Medical History:  Diagnosis Date  . Anxiety   . Bronchitis   . Carpal tunnel syndrome on both sides   . Depression   . Family history of adverse reaction to anesthesia    mother had problems waking up from surgery.  . Gestational diabetes mellitus    Normal 2 hr GTT postpartum  . Headache    migraines  . Hypertension   . Oligohydramnios antepartum 11/09/2017  . Panic attack 2017   diagnosed 3 months ago. no meds presently  . Reflux    did not filll prescription    Past Surgical History:  Procedure Laterality Date  . BREAST BIOPSY  right    cyst  . BUBBLE STUDY  04/16/2020   Procedure: BUBBLE STUDY;  Surgeon: Chilton Si, MD;  Location: [facility];  Service: Cardiovascular;;  . DILATION AND EVACUATION N/A 07/26/2015   Procedure: DILATATION AND EVACUATION;  Surgeon: Brock Bad, MD;  Location: [facility];  Service: Gynecology;  Laterality: N/A;  . TEE WITHOUT CARDIOVERSION N/A 04/16/2020   Procedure: TRANSESOPHAGEAL ECHOCARDIOGRAM (TEE);  Surgeon: Chilton Si, MD;  Location: [facility];  Service: Cardiovascular;  Laterality: N/A;    There were no vitals filed for this visit.          ADULT SLP TREATMENT - 05/21/20 0001      General Information   Behavior/Cognition Alert;Cooperative;Pleasant mood      Treatment Provided    Treatment provided Cognitive-Linquistic      Cognitive-Linquistic Treatment   Treatment focused on Aphasia    Skilled Treatment Treatment provided this date focused on the communication breakdowns that occur because she feels like it takes longer to process. SLP provided patient with handouts and explained communication tips for the communication partner. Also provided handout/education and demonstrated examples of active listening skills.      Assessment / Recommendations / Plan   Plan Continue with current plan of care      Progression Toward Goals   Progression toward goals Progressing toward goals              SLP Short Term Goals - 05/21/20 1110      SLP SHORT TERM GOAL #1   Title Pt will utilize word finding strategies (of a list) given a verbal prompt to increase word finding ability.    Time 4    Period Weeks    Status On-going      SLP SHORT TERM GOAL #2   Title Pt will recall 2 memory strategies to assist with medication and financial management.    Time 4    Period Weeks    Status On-going      SLP SHORT TERM GOAL #3   Title Patient will demo attention to detail in school/work-like  material with double checking/self-corrections.    Time 4    Period Weeks    Status On-going      SLP SHORT TERM GOAL #4   Title Patient will complete planning/organization exercises with mod verbal and visual cues.    Time 4    Period Weeks    Status On-going            SLP Long Term Goals - 05/21/20 1110      SLP LONG TERM GOAL #1   Title Patient will participate in restorative and compensatory therapy to improve verbal expression by incorporating strategies to communicate wants and needs effectively to different conversational partners, maintain safety and participate socially in their functional living environment.    Time 10    Period Weeks    Status On-going      SLP LONG TERM GOAL #2   Title Patient will demonstrate use of memory strategies to schedule activities,  recall weekly events and items to maintain safety to participate socially in functional living environment    Time 10    Period Weeks    Status On-going      SLP LONG TERM GOAL #3   Title Patient will increase information processing speed during planning and organization daily living activities to improve safety and awareness in functional living environment    Time 10    Period Weeks    Status New      SLP LONG TERM GOAL #4   Title Patient will develop functional attention skills to effectively attend to and communicate in tasks of daily living in their functional living environment.    Time 10    Period Weeks    Status On-going            Plan - 05/21/20 1110    Clinical Impression Statement Pt is a 45 yo female with hx significant for RCVA. 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 She would like to get back to work and continue pursuing her bachelors degree. Since the CVA, patient reports trouble with recalling words and difficulty holding information in her head. Cognitive-communication was assessed this date using the SLUMS. Patient scored a 17/30 indicating a moderate impairment. Difficulty noted in working memory, short term memory, attention, and generative naming. Informal assessment of receptive and expressive language skills noted rare anomia, but more disorganized thought processes. Given extra time, patient was able to achieve the correct word. SLP rec skilled speech services to address expressive aphasia and cognitive-communication impairment to achieve participation in all ADLs.    Speech Therapy Frequency 2x / week    Duration 12 weeks    Treatment/Interventions Compensatory strategies;Cueing hierarchy;Functional tasks;Patient/family education;Environmental controls;Cognitive reorganization;Multimodal communcation approach;Language facilitation;Compensatory techniques;Internal/external aids;SLP instruction and feedback     Potential to Achieve Goals Good    SLP Home Exercise Plan Word finding    Consulted and Agree with Plan of Care Patient           Patient will benefit from skilled therapeutic intervention in order to improve the following deficits and impairments:   Aphasia  Cognitive communication deficit    Problem List Patient Active Problem List   Diagnosis Date Noted  . Cerebrovascular accident (CVA) due to embolism of anterior cerebral artery ([facility])   . Encounter for general adult medical examination with abnormal findings 04/13/2020  . Visit for screening mammogram 04/13/2020  . Moderate episode of recurrent major depressive disorder ([facility]) 04/09/2020  .  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 Cerebral thrombosis with cerebral infarction 04/04/2020  . Hypokalemia 04/03/2020  . Left leg weakness 04/02/2020  . Hypertension   . Depression   . Anxiety 11/27/2017  . Vitamin D deficiency 06/07/2017  . History of prior pregnancy with IUGR newborn 06/04/2017  . Anxiety, generalized 02/09/2016    Dorena Bodo MS, [place], CBIS  05/21/2020, 11:11 AM  [facility] [address] [place], [place], [address] Phone: [phone]   Fax:  [phone]   Name: Alyrica Thurow MRN: [id number] Date of Birth: 21-Sep-1975

## 2020-05-21 NOTE — Patient Instructions (Signed)
.   Use your active listening skills during conversation to increase your understanding: o Clarification o Probing questions o Paraphrasing o Summarizing

## 2020-05-21 NOTE — Patient Instructions (Signed)
Access Code: [email or web address] URL: [email or web address] Date: 05/21/2020 Prepared by: Jethro Bastos  Exercises Sit to Stand Without Arm Support - 1 x daily - 5 x weekly - 2 sets - 10 reps Sitting Knee Extension with Resistance - 2 x daily - 5 x weekly - 2 sets - 10 reps Seated Hamstring Curl with Anchored Resistance - 2 x daily - 5 x weekly - 2 sets - 10 reps Supine Bridge - 1 x daily - 5 x weekly - 2 sets - 5 reps Small Range Straight Leg Raise - 1 x daily - 5 x weekly - 2 sets - 5 reps Standing Hip Abduction with Counter Support - 1 x daily - 5 x weekly - 2 sets - 10 reps

## 2020-05-24 ENCOUNTER — Ambulatory Visit: Payer: 59 | Admitting: Physical Therapy

## 2020-05-24 ENCOUNTER — Other Ambulatory Visit: Payer: Self-pay | Admitting: Internal Medicine

## 2020-05-24 ENCOUNTER — Telehealth: Payer: Self-pay | Admitting: Internal Medicine

## 2020-05-24 DIAGNOSIS — E785 Hyperlipidemia, unspecified: Secondary | ICD-10-CM

## 2020-05-24 DIAGNOSIS — F324 Major depressive disorder, single episode, in partial remission: Secondary | ICD-10-CM

## 2020-05-24 MED ORDER — BUPROPION HCL 75 MG PO TABS: 75.0000 mg | ORAL_TABLET | Freq: Two times a day (BID) | ORAL | 1 refills | Status: DC

## 2020-05-24 MED ORDER — ROSUVASTATIN CALCIUM 10 MG PO TABS: 10.0000 mg | ORAL_TABLET | Freq: Every day | ORAL | 1 refills | Status: DC

## 2020-05-24 NOTE — Telephone Encounter (Signed)
Patient requesting refill for  rosuvastatin (CRESTOR) 10 MG tablet (Expired) buPROPion (WELLBUTRIN) 75 MG tablet   Pharmacy [id number] - [place], [place] - [address] AT [facility] [address]

## 2020-05-27 ENCOUNTER — Ambulatory Visit: Payer: 59 | Admitting: Physical Therapy

## 2020-05-27 ENCOUNTER — Other Ambulatory Visit: Payer: Self-pay

## 2020-05-27 ENCOUNTER — Telehealth: Payer: Self-pay | Admitting: Physical Therapy

## 2020-05-27 ENCOUNTER — Ambulatory Visit: Payer: 59 | Admitting: Speech Pathology

## 2020-05-27 NOTE — Telephone Encounter (Signed)
Called pt regarding pt's no show appointment today at PT and it was pt's last scheduled visit. Had to leave a voicemail.  Sherlie Ban, PT, DPT 05/27/20 10:33 AM    [facility] [address] [place], [place]  [address] Phone:  [phone] Fax:  [phone]

## 2020-05-28 ENCOUNTER — Ambulatory Visit (INDEPENDENT_AMBULATORY_CARE_PROVIDER_SITE_OTHER): Payer: 59 | Admitting: Cardiovascular Disease

## 2020-05-28 ENCOUNTER — Encounter: Payer: Self-pay | Admitting: Cardiovascular Disease

## 2020-05-28 VITALS — BP 130/82 | HR 76 | Ht 66.0 in | Wt 215.0 lb

## 2020-05-28 DIAGNOSIS — Q211 Atrial septal defect: Secondary | ICD-10-CM

## 2020-05-28 DIAGNOSIS — Q2112 Patent foramen ovale: Secondary | ICD-10-CM

## 2020-05-28 LAB — CBC WITH DIFFERENTIAL/PLATELET
Basophils Absolute: 0.1 10*3/uL (ref 0.0–0.2)
Basos: 1 %
EOS (ABSOLUTE): 0 10*3/uL (ref 0.0–0.4)
Eos: 0 %
Hematocrit: 42.5 % (ref 34.0–46.6)
Hemoglobin: 14.2 g/dL (ref 11.1–15.9)
Immature Grans (Abs): 0 10*3/uL (ref 0.0–0.1)
Immature Granulocytes: 0 %
Lymphocytes Absolute: 1.9 10*3/uL (ref 0.7–3.1)
Lymphs: 26 %
MCH: 29.3 pg (ref 26.6–33.0)
MCHC: 33.4 g/dL (ref 31.5–35.7)
MCV: 88 fL (ref 79–97)
Monocytes Absolute: 0.7 10*3/uL (ref 0.1–0.9)
Monocytes: 9 %
Neutrophils Absolute: 4.6 10*3/uL (ref 1.4–7.0)
Neutrophils: 64 %
Platelets: 285 10*3/uL (ref 150–450)
RBC: 4.84 x10E6/uL (ref 3.77–5.28)
RDW: 12.4 % (ref 11.7–15.4)
WBC: 7.3 10*3/uL (ref 3.4–10.8)

## 2020-05-28 LAB — BASIC METABOLIC PANEL
BUN/Creatinine Ratio: 10 (ref 9–23)
BUN: 10 mg/dL (ref 6–24)
CO2: 19 mmol/L — ABNORMAL LOW (ref 20–29)
Calcium: 9.4 mg/dL (ref 8.7–10.2)
Chloride: 104 mmol/L (ref 96–106)
Creatinine, Ser: 1.04 mg/dL — ABNORMAL HIGH (ref 0.57–1.00)
Glucose: 100 mg/dL — ABNORMAL HIGH (ref 65–99)
Potassium: 4.4 mmol/L (ref 3.5–5.2)
Sodium: 138 mmol/L (ref 134–144)
eGFR: 69 mL/min/{1.73_m2} (ref >59)

## 2020-05-28 MED ORDER — CLOPIDOGREL BISULFATE 75 MG PO TABS: 75.0000 mg | ORAL_TABLET | Freq: Every day | ORAL | 1 refills | Status: AC

## 2020-05-28 NOTE — H&P (View-Only) (Signed)
[facility]   STRUCTURAL HEART TEAM  Date:  05/28/2020   ID:  Tracey [name], DOB 07/23/1975, MRN [id number]  PCP:  Jones, Thomas L, MD   Chief Complaint  Patient presents with  . PFO Evaluation     HISTORY OF PRESENT ILLNESS: Tracey [name] is a 45 y.o. female who presents for evaluation of PFO, referred by Dr Pemberton for evaluation of PFO management.  She is here with her husband today. She was diagnosed with HTN after she gave birth to their youngest child who is now 2 years old. She has been on antihypertensive Rx since that time. She is a current smoker who is tapering off in efforts to quit.  She presented April 02, 2020 with acute neck discomfort and left-sided numbness and weakness.  She was found to have a small acute infarct in the right posterior frontal lobe, felt to possibly be an embolic event.  Stroke evaluation revealed no evidence of large vessel vascular occlusive disease.  Hypercoagulable panel was negative.  Transcranial Doppler study was negative at rest but showed Spencer grade 4 right to left shunt with Valsalva suggestive of PFO.  A transesophageal echo confirmed PFO with color-flow and positive agitated saline study across the interatrial septum.  From a symptomatic perspective, she is doing pretty well.  She reports that all of her stroke symptoms have improved.  She is doing some speech therapy and has noticed improvement in this.  She denies chest pain or shortness of breath.  No heart palpitations.  Her grandmother had a stroke, but there is no history of premature CAD or stroke at a young age.   The patient denies any history of nickel allergy or reaction.  Past Medical History:  Diagnosis Date  . Anxiety   . Bronchitis   . Carpal tunnel syndrome on both sides   . Depression   . Family history of adverse reaction to anesthesia    mother had problems waking up from surgery.  . Gestational diabetes mellitus    Normal 2 hr GTT postpartum  .  Headache    migraines  . Hypertension   . Oligohydramnios antepartum 11/09/2017  . Panic attack 2017   diagnosed 3 months ago. no meds presently  . Reflux    did not filll prescription    Current Outpatient Medications  Medication Sig Dispense Refill  . aspirin 81 MG EC tablet Take 81 mg by mouth daily. Swallow whole.    . buPROPion (WELLBUTRIN) 75 MG tablet Take 1 tablet (75 mg total) by mouth 2 (two) times daily. 180 tablet 1  . COD LIVER OIL PO Take 1 capsule by mouth daily.    . lisinopril (ZESTRIL) 20 MG tablet Take 1.5 tablets (30 mg total) by mouth daily. 135 tablet 3  . Multiple Vitamins-Minerals (MULTIVITAMIN WITH MINERALS) tablet Take 1 tablet by mouth daily.    . Probiotic Product (PROBIOTIC DAILY PO) Take 1 tablet by mouth daily.     . rosuvastatin (CRESTOR) 10 MG tablet Take 1 tablet (10 mg total) by mouth daily. 90 tablet 1   No current facility-administered medications for this visit.    ALLERGIES:   Patient has no known allergies.   SOCIAL HISTORY:  The patient  reports that she has been smoking cigarettes. She has been smoking about 0.25 packs per day. She has never used smokeless tobacco. She reports that she does not drink alcohol and does not use drugs.   FAMILY HISTORY:  The patient's family   history includes Diabetes in her maternal aunt and maternal uncle; Hypertension in her mother.   REVIEW OF SYSTEMS:  Positive for anxiety.   All other systems are reviewed and negative.   PHYSICAL EXAM: VS:  BP 130/82   Pulse 76   Ht 5\' 6"  (1.676 m)   Wt 215 lb (97.5 kg)   SpO2 97%   BMI 34.70 kg/m  , BMI Body mass index is 34.7 kg/m. GEN: Well nourished, well developed, in no acute distress HEENT: normal Neck: No JVD. carotids 2+ without bruits or masses Cardiac: The heart is RRR without murmurs, rubs, or gallops. No edema. Pedal pulses 2+ = bilaterally  Respiratory:  clear to auscultation bilaterally GI: soft, nontender, nondistended, + BS MS: no deformity or  atrophy Skin: warm and dry, no rash Neuro:  Strength and sensation are intact Psych: euthymic mood, full affect  EKG:  EKG from today reviewed and demonstrates normal sinus rhythm 76 bpm, within normal limits.  RECENT LABS: 04/03/2020: ALT 19 04/09/2020: BUN 12; Creatinine, Ser 1.07; Magnesium 1.9; Potassium 3.9; Sodium 137; TSH 2.93 04/13/2020: Hemoglobin 13.6; Platelets 274  04/03/2020: Cholesterol 149; HDL 37; LDL Cholesterol 101; Total CHOL/HDL Ratio 4.0; Triglycerides 55; VLDL 11   CrCl cannot be calculated (Patient's most recent lab result is older than the maximum 21 days allowed.).   Wt Readings from Last 3 Encounters:  05/28/20 215 lb (97.5 kg)  05/03/20 219 lb (99.3 kg)  04/16/20 215 lb (97.5 kg)     PERTINENT STUDIES:  Echo:  IMPRESSIONS    1. Left ventricular ejection fraction, by estimation, is 55 to 60%. The  left ventricle has normal function. The left ventricle has no regional  wall motion abnormalities. Left ventricular diastolic parameters were  normal.  2. Right ventricular systolic function is normal. The right ventricular  size is normal.  3. Left atrial size was mildly dilated.  4. The mitral valve is normal in structure. Trivial mitral valve  regurgitation. No evidence of mitral stenosis.  5. The aortic valve is normal in structure. Aortic valve regurgitation is  not visualized. No aortic stenosis is present.  6. The inferior vena cava is normal in size with greater than 50%  respiratory variability, suggesting right atrial pressure of 3 mmHg.    Transcranial Doppler:   HITS heard at rest: none  HITS heard during valsalva: Positive for Spencer grade 4 (partail curtain)  PFO.    MRI Brain:  IMPRESSION: 1. 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 2. Additional mild scattered T2/FLAIR hyperintensities within the white matter, mildly advanced for age. These are nonspecific  and could relate to chronic microvascular ischemic disease, prior migraines, trauma, demyelination, or inflammation.   TEE:  IMPRESSIONS    1. Left ventricular ejection fraction, by estimation, is 60 to 65%. The  left ventricle has normal function. The left ventricle has no regional  wall motion abnormalities.  2. Right ventricular systolic function is normal. The right ventricular  size is normal.  3. No left atrial/left atrial appendage thrombus was detected. The LAA  emptying velocity was 71 cm/s.  4. The mitral valve is normal in structure. Trivial mitral valve  regurgitation. No evidence of mitral stenosis.  5. The aortic valve is tricuspid. Aortic valve regurgitation is not  visualized. No aortic stenosis is present.  6. Evidence of atrial level shunting detected by color flow Doppler.  Agitated saline contrast bubble study was positive with shunting observed  within 3-6 cardiac cycles suggestive of interatrial shunt. There is a  small patent foramen ovale with  predominantly right to left shunting across the atrial septum.   Conclusion(s)/Recommendation(s): Normal biventricular function without  evidence of hemodynamically significant valvular heart disease.   ASSESSMENT AND PLAN: 1.  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   Radiographic results, hospital notes, lab results, and cardiac imaging data are reviewed. TEE images are reviewed and demonstrate normal LV and RV function, no significant valvular disease, and presence of a PFO with evidence of right to left shunting by positive agitated saline study.  Color Doppler is reported to demonstrate flow across the interatrial septum.  I do not appreciate this on the stored images but anatomically there is suggestion of a PFO with close of interrogation of the septum.  The patient's Rope Score is 6, indicating a moderate probability that the patient's stroke is 'PFO-related.'  The patient is counseled about the  association of PFO and cryptogenic stroke. Available clinical trial data is reviewed, specifically those trials comparing transcatheter PFO closure and medical therapy with antiplatelet drugs. The patient understands the potential benefit of PFO closure with respect to secondary stroke reduction compared with medical therapy alone. Specific risks of transcatheter PFO closure are reviewed with the patient. These risks include bleeding, infection, device embolization, stroke, cardiac perforation, tamponade, arrhythmia, MI, and late device erosion. She understands these serious risks occur at low incidence of < 1%.  The patient understands the need to take dual antiplatelet therapy with aspirin and clopidogrel together for a minimum period of 3 months following transcatheter PFO closure.  They understand the need to follow SBE prophylaxis per guidelines for a period of 6 months following PFO closure.  The patient provides full informed consent for the procedure.  She will be started on clopidogrel prior to the procedure.  Tonny Bollman 05/28/2020 9:25 AM

## 2020-05-28 NOTE — Progress Notes (Signed)
[facility]   STRUCTURAL HEART TEAM  Date:  05/28/2020   ID:  Tracey [name], DOB [birth year], MRN [id number]  PCP:  Etta Grandchild, MD   Chief Complaint  Patient presents with  . PFO Evaluation     HISTORY OF PRESENT ILLNESS: Tracey [name] is a 45 y.o. female who presents for evaluation of PFO, referred by Dr Shari Prows for evaluation of PFO management.  She is here with her husband today. She was diagnosed with HTN after she gave birth to their youngest child who is now 97 years old. She has been on antihypertensive Rx since that time. She is a current smoker who is tapering off in efforts to quit.  She presented April 02, 2020 with acute neck discomfort and left-sided numbness and weakness.  She was found to have a small acute infarct in the right posterior frontal lobe, felt to possibly be an embolic event.  Stroke evaluation revealed no evidence of large vessel vascular occlusive disease.  Hypercoagulable panel was negative.  Transcranial Doppler study was negative at rest but showed Spencer grade 4 right to left shunt with Valsalva suggestive of PFO.  A transesophageal echo confirmed PFO with color-flow and positive agitated saline study across the interatrial septum.  From a symptomatic perspective, she is doing pretty well.  She reports that all of her stroke symptoms have improved.  She is doing some speech therapy and has noticed improvement in this.  She denies chest pain or shortness of breath.  No heart palpitations.  Her grandmother had a stroke, but there is no history of premature CAD or stroke at a young age.   The patient denies any history of nickel allergy or reaction.  Past Medical History:  Diagnosis Date  . Anxiety   . Bronchitis   . Carpal tunnel syndrome on both sides   . Depression   . Family history of adverse reaction to anesthesia    mother had problems waking up from surgery.  . Gestational diabetes mellitus    Normal 2 hr GTT postpartum  .  Headache    migraines  . Hypertension   . Oligohydramnios antepartum 11/09/2017  . Panic attack 2017   diagnosed 3 months ago. no meds presently  . Reflux    did not filll prescription    Current Outpatient Medications  Medication Sig Dispense Refill  . aspirin 81 MG EC tablet Take 81 mg by mouth daily. Swallow whole.    Marland Kitchen buPROPion (WELLBUTRIN) 75 MG tablet Take 1 tablet (75 mg total) by mouth 2 (two) times daily. 180 tablet 1  . COD LIVER OIL PO Take 1 capsule by mouth daily.    Marland Kitchen lisinopril (ZESTRIL) 20 MG tablet Take 1.5 tablets (30 mg total) by mouth daily. 135 tablet 3  . Multiple Vitamins-Minerals (MULTIVITAMIN WITH MINERALS) tablet Take 1 tablet by mouth daily.    . Probiotic Product (PROBIOTIC DAILY PO) Take 1 tablet by mouth daily.     . rosuvastatin (CRESTOR) 10 MG tablet Take 1 tablet (10 mg total) by mouth daily. 90 tablet 1   No current facility-administered medications for this visit.    ALLERGIES:   Patient has no known allergies.   SOCIAL HISTORY:  The patient  reports that she has been smoking cigarettes. She has been smoking about 0.25 packs per day. She has never used smokeless tobacco. She reports that she does not drink alcohol and does not use drugs.   FAMILY HISTORY:  The patient's family  history includes Diabetes in her maternal aunt and maternal uncle; Hypertension in her mother.   REVIEW OF SYSTEMS:  Positive for anxiety.   All other systems are reviewed and negative.   PHYSICAL EXAM: VS:  BP 130/82   Pulse 76   Ht 5\' 6"  (1.676 m)   Wt 215 lb (97.5 kg)   SpO2 97%   BMI 34.70 kg/m  , BMI Body mass index is 34.7 kg/m. GEN: Well nourished, well developed, in no acute distress HEENT: normal Neck: No JVD. carotids 2+ without bruits or masses Cardiac: The heart is RRR without murmurs, rubs, or gallops. No edema. Pedal pulses 2+ = bilaterally  Respiratory:  clear to auscultation bilaterally GI: soft, nontender, nondistended, + BS MS: no deformity or  atrophy Skin: warm and dry, no rash Neuro:  Strength and sensation are intact Psych: euthymic mood, full affect  EKG:  EKG from today reviewed and demonstrates normal sinus rhythm 76 bpm, within normal limits.  RECENT LABS: 04/03/2020: ALT 19 04/09/2020: BUN 12; Creatinine, Ser 1.07; Magnesium 1.9; Potassium 3.9; Sodium 137; TSH 2.93 04/13/2020: Hemoglobin 13.6; Platelets 274  04/03/2020: Cholesterol 149; HDL 37; LDL Cholesterol 101; Total CHOL/HDL Ratio 4.0; Triglycerides 55; VLDL 11   CrCl cannot be calculated (Patient's most recent lab result is older than the maximum 21 days allowed.).   Wt Readings from Last 3 Encounters:  05/28/20 215 lb (97.5 kg)  05/03/20 219 lb (99.3 kg)  04/16/20 215 lb (97.5 kg)     PERTINENT STUDIES:  Echo:  IMPRESSIONS    1. Left ventricular ejection fraction, by estimation, is 55 to 60%. The  left ventricle has normal function. The left ventricle has no regional  wall motion abnormalities. Left ventricular diastolic parameters were  normal.  2. Right ventricular systolic function is normal. The right ventricular  size is normal.  3. Left atrial size was mildly dilated.  4. The mitral valve is normal in structure. Trivial mitral valve  regurgitation. No evidence of mitral stenosis.  5. The aortic valve is normal in structure. Aortic valve regurgitation is  not visualized. No aortic stenosis is present.  6. The inferior vena cava is normal in size with greater than 50%  respiratory variability, suggesting right atrial pressure of 3 mmHg.    Transcranial Doppler:   HITS heard at rest: none  HITS heard during valsalva: Positive for Spencer grade 4 (partail curtain)  PFO.    MRI Brain:  IMPRESSION: 1. 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 2. Additional mild scattered T2/FLAIR hyperintensities within the white matter, mildly advanced for age. These are nonspecific  and could relate to chronic microvascular ischemic disease, prior migraines, trauma, demyelination, or inflammation.   TEE:  IMPRESSIONS    1. Left ventricular ejection fraction, by estimation, is 60 to 65%. The  left ventricle has normal function. The left ventricle has no regional  wall motion abnormalities.  2. Right ventricular systolic function is normal. The right ventricular  size is normal.  3. No left atrial/left atrial appendage thrombus was detected. The LAA  emptying velocity was 71 cm/s.  4. The mitral valve is normal in structure. Trivial mitral valve  regurgitation. No evidence of mitral stenosis.  5. The aortic valve is tricuspid. Aortic valve regurgitation is not  visualized. No aortic stenosis is present.  6. Evidence of atrial level shunting detected by color flow Doppler.  Agitated saline contrast bubble study was positive with shunting observed  within 3-6 cardiac cycles suggestive of interatrial shunt. There is a  small patent foramen ovale with  predominantly right to left shunting across the atrial septum.   Conclusion(s)/Recommendation(s): Normal biventricular function without  evidence of hemodynamically significant valvular heart disease.   ASSESSMENT AND PLAN: 1.  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   Radiographic results, hospital notes, lab results, and cardiac imaging data are reviewed. TEE images are reviewed and demonstrate normal LV and RV function, no significant valvular disease, and presence of a PFO with evidence of right to left shunting by positive agitated saline study.  Color Doppler is reported to demonstrate flow across the interatrial septum.  I do not appreciate this on the stored images but anatomically there is suggestion of a PFO with close of interrogation of the septum.  The patient's Rope Score is 6, indicating a moderate probability that the patient's stroke is 'PFO-related.'  The patient is counseled about the  association of PFO and cryptogenic stroke. Available clinical trial data is reviewed, specifically those trials comparing transcatheter PFO closure and medical therapy with antiplatelet drugs. The patient understands the potential benefit of PFO closure with respect to secondary stroke reduction compared with medical therapy alone. Specific risks of transcatheter PFO closure are reviewed with the patient. These risks include bleeding, infection, device embolization, stroke, cardiac perforation, tamponade, arrhythmia, MI, and late device erosion. She understands these serious risks occur at low incidence of < 1%.  The patient understands the need to take dual antiplatelet therapy with aspirin and clopidogrel together for a minimum period of 3 months following transcatheter PFO closure.  They understand the need to follow SBE prophylaxis per guidelines for a period of 6 months following PFO closure.  The patient provides full informed consent for the procedure.  She will be started on clopidogrel prior to the procedure.  Tonny Bollman 05/28/2020 9:25 AM

## 2020-05-28 NOTE — Patient Instructions (Signed)
COVID SCREENING INFORMATION (06/14/20): You are scheduled for your drive-thru COVID screening on 3/28 between 1PM and 2PM. Pre-Procedural COVID-19 Testing Site [address] [place], [place] [address] You will need to go home after your screening and quarantine until your procedure.   PFO CLOSURE INSTRUCTIONS (06/16/20): You are scheduled for a PFO CLOSURE on Thursday, March 30 with Dr. Tonny Bollman.  1. Please arrive at the [facility] (Main Entrance A) at [facility]: [address] [place], [place] [address] at 6:30 AM (This time is two hours before your procedure to ensure your preparation). Free valet parking service is available. You are allowed ONE visitor in the waiting room during your procedure. Both you and your guest must wear masks. Special note: Every effort is made to have your procedure done on time. Please understand that emergencies sometimes delay scheduled procedures.  2. Diet: Do not eat solid foods after midnight.  You may have clear liquids until 5am upon the day of the procedure.  3. Labs: TODAY! BMET, CBC  4. Medication instructions in preparation for your procedure:  1) START PLAVIX 75 mg 5 days prior to your procedure  2) MAKE SURE TO TAKE ASPIRIN AND PLAVIX the morning of your procedure  3) You may take your other medications as directed with sips of water  5. Plan for one night stay--bring personal belongings. 6. Bring a current list of your medications and current insurance cards. 7. You MUST have a responsible person to drive you home. 8. Someone MUST be with you the first 24 hours after you arrive home or your discharge will be delayed. 9. Please wear clothes that are easy to get on and off and wear slip-on shoes.  FOLLOW-UP VISIT (07/15/20): You are scheduled for your 1 month follow-up with Dr. Earmon Phoenix assistant Carlean Jews, PA, on 07/15/2020 at 1:30PM.

## 2020-05-31 ENCOUNTER — Ambulatory Visit (INDEPENDENT_AMBULATORY_CARE_PROVIDER_SITE_OTHER): Payer: 59 | Admitting: Neurology

## 2020-05-31 ENCOUNTER — Encounter: Payer: Self-pay | Admitting: Neurology

## 2020-05-31 VITALS — BP 138/92 | HR 70 | Ht 66.0 in | Wt 215.0 lb

## 2020-05-31 DIAGNOSIS — R0683 Snoring: Secondary | ICD-10-CM

## 2020-05-31 DIAGNOSIS — F17210 Nicotine dependence, cigarettes, uncomplicated: Secondary | ICD-10-CM | POA: Insufficient documentation

## 2020-05-31 DIAGNOSIS — Q211 Atrial septal defect: Secondary | ICD-10-CM | POA: Diagnosis not present

## 2020-05-31 DIAGNOSIS — R29818 Other symptoms and signs involving the nervous system: Secondary | ICD-10-CM

## 2020-05-31 DIAGNOSIS — I639 Cerebral infarction, unspecified: Secondary | ICD-10-CM

## 2020-05-31 DIAGNOSIS — Q2112 Patent foramen ovale: Secondary | ICD-10-CM

## 2020-05-31 DIAGNOSIS — Z8673 Personal history of transient ischemic attack (TIA), and cerebral infarction without residual deficits: Secondary | ICD-10-CM | POA: Insufficient documentation

## 2020-05-31 NOTE — Progress Notes (Signed)
SLEEP MEDICINE CLINIC    Provider:  Melvyn Novasarmen  Dohmeier, MD  Primary Care Physician:  Tracey [name], Tracey L, MD [address] [place] [id number]     Referring Provider: Etta [name], Tracey L, Md [address] [place] [id number]          Chief Complaint according to patient   Patient presents with:    . New Patient (Initial Visit)     Presents today to complete work up from stroke that occurred in Jan. She states that she has woke herself up gasping for air. Her husband states she snores. Never had a SS. Here to rule out.      HISTORY OF PRESENT ILLNESS:  Tracey BiddingCindy [name] is a 45 - year- old African American female cryptogenic Stroke  patient and seen upon referral from PCP and cardiology, she is followed by the stroke team, on 05/31/2020. she nis mother of a [id number] year-old, and she has a Mirena IUD.   Tracey [name] has a  has a past medical history of Anxiety, Bronchitis, Carpal tunnel syndrome on both sides, Depression, Family history of adverse reaction to anesthesia, Gestational diabetes mellitus, Headache, Hypertension, Oligohydramnios antepartum (11/09/2017), Panic attack (2017), and Reflux. she had a cryptogenic stroke, she is a smoker and she is obese. She snores. She has insomnia with anxiety, She has nocturnal palpitations. She wakes gasping for air. She was a sleep walker in childhood.  Family medical /sleep history:No  other family member on CPAP with OSA.    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 The patient currently works regular office hours. Pets- one dog.  Tobacco use; smoking, cigarettes, reduced on Wellbutrin-.which also reduced her appetite.  ETOH use - socially ; Caffeine intake in form of Coffee( 2-3 a day) Soda(  Reduced now 1 a day), Tea ( /) or energy drinks. Regular exercise in form of walker.   Sleep habits are as  follows: The patient's dinner time is between 5-6 PM. The patient goes to bed at 12 PM and struggles to go to sleep- continues to sleep for intervals of 2 hours.   The preferred sleep position is left sided, with the support of 2-4 pillows. GERD. Dreams are reportedly frequent/vivid- on wellbutrin.     6.30 AM is the usual rise time. The patient wakes up spontaneously.   She reports not feeling refreshed or restored in AM, with symptom of residual fatigue.  Naps are taken recently more frequently, lasting from 1-2 hours- while not yet working again-  and are more refreshing than nocturnal sleep.    Review of Systems: Out of a complete 14 system review, the patient complains of only the following symptoms, and all other reviewed systems are negative.:  Fatigue, sleepiness , snoring, fragmented sleep, Insomnia , GERD, palpitation - no nocturia.   Right brain stroke.   How likely are you to doze in the following situations: 0 = not likely, 1 = slight chance, 2 = moderate chance, 3 = high chance   Sitting and Reading? Watching Television? Sitting inactive in a public place (theater or meeting)? As a passenger in a car for an hour without a break? Lying down in the afternoon when circumstances permit? Sitting and talking to someone? Sitting quietly after lunch without alcohol? In a car, while stopped  for a few minutes in traffic?   Total = 6 with 2 naps, if she cant nap its 14 - / 24 points   FSS endorsed at 15/ 63 points.   Social History   Socioeconomic History  . Marital status: Married    Spouse name: Not on file  . Number of children: Not on file  . Years of education: Not on file  . Highest education level: Not on file  Occupational History  . Not on file  Tobacco Use  . Smoking status: Current Some Day Smoker    Packs/day: 0.25    Types: Cigarettes  . Smokeless tobacco: Never Used  . Tobacco comment: 2 cig/day  Vaping Use  . Vaping Use: Never used  Substance and Sexual  Activity  . Alcohol use: No    Alcohol/week: 0.0 standard drinks  . Drug use: No  . Sexual activity: Not Currently  Other Topics Concern  . Not on file  Social History Narrative  . Not on file   Social Determinants of Health   Financial Resource Strain: Not on file  Food Insecurity: Not on file  Transportation Needs: Not on file  Physical Activity: Not on file  Stress: Not on file  Social Connections: Not on file    Family History  Problem Relation Age of Onset  . Hypertension Mother   . Diabetes Maternal Aunt   . Diabetes Maternal Uncle     Past Medical History:  Diagnosis Date  . Anxiety   . Bronchitis   . Carpal tunnel syndrome on both sides   . Depression   . Family history of adverse reaction to anesthesia    mother had problems waking up from surgery.  . Gestational diabetes mellitus    Normal 2 hr GTT postpartum  . Headache    migraines  . Hypertension   . Oligohydramnios antepartum 11/09/2017  . Panic attack 2017   diagnosed 3 months ago. no meds presently  . Reflux    did not filll prescription    Past Surgical History:  Procedure Laterality Date  . BREAST BIOPSY  right    cyst  . BUBBLE STUDY  04/16/2020   Procedure: BUBBLE STUDY;  Surgeon: Chilton Si, MD;  Location: [facility] ENDOSCOPY;  Service: Cardiovascular;;  . DILATION AND EVACUATION N/A 07/26/2015   Procedure: DILATATION AND EVACUATION;  Surgeon: Brock Bad, MD;  Location: [facility] ORS;  Service: Gynecology;  Laterality: N/A;  . TEE WITHOUT CARDIOVERSION N/A 04/16/2020   Procedure: TRANSESOPHAGEAL ECHOCARDIOGRAM (TEE);  Surgeon: Chilton Si, MD;  Location: [facility] ENDOSCOPY;  Service: Cardiovascular;  Laterality: N/A;     Current Outpatient Medications on File Prior to Visit  Medication Sig Dispense Refill  . aspirin 81 MG EC tablet Take 81 mg by mouth daily. Swallow whole.    Marland Kitchen buPROPion (WELLBUTRIN) 75 MG tablet Take 1 tablet (75 mg total) by mouth 2 (two) times daily. 180 tablet 1  .  clopidogrel (PLAVIX) 75 MG tablet Take 1 tablet (75 mg total) by mouth daily. 90 tablet 1  . COD LIVER OIL PO Take 1 capsule by mouth daily.    Marland Kitchen lisinopril (ZESTRIL) 20 MG tablet Take 1.5 tablets (30 mg total) by mouth daily. 135 tablet 3  . Multiple Vitamins-Minerals (MULTIVITAMIN WITH MINERALS) tablet Take 1 tablet by mouth daily.    . Probiotic Product (PROBIOTIC DAILY PO) Take 1 tablet by mouth daily.     . rosuvastatin (CRESTOR) 10 MG tablet Take 1 tablet (10 mg  total) by mouth daily. 90 tablet 1   No current facility-administered medications on file prior to visit.   Physical exam:  There were no vitals filed for this visit. There is no height or weight on file to calculate BMI.   Wt Readings from Last 3 Encounters:  05/28/20 215 lb (97.5 kg)  05/03/20 219 lb (99.3 kg)  04/16/20 215 lb (97.5 kg)     Ht Readings from Last 3 Encounters:  05/28/20 5\' 6"  (1.676 m)  05/03/20 5\' 6"  (1.676 m)  04/16/20 5\' 7"  (1.702 m)      General: The patient is awake, alert and appears not in acute distress. The patient is well groomed. Head: Normocephalic, atraumatic. Neck is supple. Mallampati  2-3 neck circumference: 16.5  inches .  Nasal airflow is patent.  Retrognathia is notseen.  Dental status: inact Cardiovascular:  Regular rate and cardiac rhythm by pulse,  without distended neck veins. Respiratory: Lungs are clear to auscultation.  Skin:  Without evidence of ankle edema, or rash. Trunk: The patient's posture is erect.BMI 34.7 kg/m2.    Neurologic exam : The patient is awake and alert, oriented to place and time.   Memory subjective described as intact.  Attention span & concentration ability appears normal.  Speech is fluent,  without  dysarthria, dysphonia or aphasia.  Mood and affect are appropriate.   Cranial nerves: no loss of smell or taste reported  Pupils are equal and briskly reactive to light.  Funduscopic exam- deferred. .  Extraocular movements in vertical and  horizontal planes were intact and without nystagmus. No Diplopia. Visual fields by finger perimetry are intact. Hearing was intact to soft voice and finger rubbing.    Facial sensation intact to fine touch.  Facial motor strength is symmetric and tongue and uvula move midline.  Neck ROM : rotation, tilt and flexion extension were normal for age and shoulder shrug was symmetrical.    Motor exam:  Symmetric bulk, tone and ROM.   Normal tone without cog wheeling, asymmetric grip strength , a bit weaker on the left. .   Sensory:  Fine touch, pinprick and vibration were tested  and  normal.  Proprioception tested in the upper extremities was normal.   Coordination: Rapid alternating movements in the fingers/hands were of normal speed.  The Finger-to-nose maneuver was intact , mild dysmetria on the left with pronator drift.  no tremor.   Gait and station: Patient could rise unassisted from a seated position, walked without assistive device.  Stance is of normal width/ base and the patient turned with 3 steps.  Toe and heel walk were deferred.  Deep tendon reflexes: in the  upper and lower extremities are symmetric and intact.  Babinski response was deferred.        After spending a total time of 45 minutes face to face and additional time for physical and neurologic examination, review of laboratory studies,  personal review of imaging studies, reports and results of other testing and review of referral information / records as far as provided in visit, I have established the following assessments:  1)  There are many risk factors for sleep apnea- obesity, smoking and observed snoring. A large airway and neck.  2)   We will order a sleep study- a HST would suffice.  3)  I will offer a referral for weight and wellness. Smoking cessation, and weight loss.  Talk to gynecologist about Mirena being a possible risk factor.     I  would like to thank Tiffany Oakdale,MD  and Tracey Grandchild,  [place] [address] [place],  [place] [address] , for allowing me to meet with and to take care of this pleasant patient.    I plan to follow up either personally or through our NP within 3 month.   CC: I will share my notes with Dr Pearlean Brownie.   Electronically signed by: Tracey Novas, MD 05/31/2020 9:04 AM  [facility] Board certified by The [employer] of Sleep Medicine and Diplomate of the [employer] of Sleep Medicine. Board certified In Neurology through the ABPN, Fellow of the [employer] of Neurology. Medical Director of [facility].

## 2020-05-31 NOTE — Patient Instructions (Signed)

## 2020-06-03 ENCOUNTER — Encounter: Payer: Self-pay | Admitting: Speech Pathology

## 2020-06-03 ENCOUNTER — Other Ambulatory Visit: Payer: Self-pay

## 2020-06-03 ENCOUNTER — Ambulatory Visit: Payer: 59 | Admitting: Speech Pathology

## 2020-06-03 DIAGNOSIS — R41841 Cognitive communication deficit: Secondary | ICD-10-CM

## 2020-06-03 DIAGNOSIS — R4701 Aphasia: Secondary | ICD-10-CM

## 2020-06-03 NOTE — Patient Instructions (Signed)
Take your external organizational strategies and choose one.

## 2020-06-03 NOTE — Therapy (Signed)
[facility] [address] [place], [place], [address] Phone: [phone]   Fax:  [phone]  Speech Language Pathology Treatment  Patient Details  Name: Tracey [name] MRN: [id number] Date of Birth: [birth year] Referring Provider (SLP): Etta Grandchild   Encounter Date: 06/03/2020   End of Session - 06/03/20 1100    Visit Number 6    Number of Visits 25    Date for SLP Re-Evaluation 08/02/19    SLP Start Time 1015    SLP Stop Time  1100    SLP Time Calculation (min) 45 min    Activity Tolerance Patient tolerated treatment well;Patient limited by fatigue           Past Medical History:  Diagnosis Date  . Anxiety   . Bronchitis   . Carpal tunnel syndrome on both sides   . Depression   . Family history of adverse reaction to anesthesia    mother had problems waking up from surgery.  . Gestational diabetes mellitus    Normal 2 hr GTT postpartum  . Headache    migraines  . Hypertension   . Oligohydramnios antepartum 11/09/2017  . Panic attack 2017   diagnosed 3 months ago. no meds presently  . Reflux    did not filll prescription    Past Surgical History:  Procedure Laterality Date  . BREAST BIOPSY  right    cyst  . BUBBLE STUDY  04/16/2020   Procedure: BUBBLE STUDY;  Surgeon: Chilton Si, MD;  Location: [facility] ENDOSCOPY;  Service: Cardiovascular;;  . DILATION AND EVACUATION N/A 07/26/2015   Procedure: DILATATION AND EVACUATION;  Surgeon: Brock Bad, MD;  Location: [facility] ORS;  Service: Gynecology;  Laterality: N/A;  . TEE WITHOUT CARDIOVERSION N/A 04/16/2020   Procedure: TRANSESOPHAGEAL ECHOCARDIOGRAM (TEE);  Surgeon: Chilton Si, MD;  Location: [facility] ENDOSCOPY;  Service: Cardiovascular;  Laterality: N/A;    There were no vitals filed for this visit.   Subjective Assessment - 06/03/20 1024    Subjective "My brain has been all over the place."    Currently in Pain? No/denies                 ADULT  SLP TREATMENT - 06/03/20 1024      General Information   Behavior/Cognition Alert;Cooperative;Pleasant mood      Treatment Provided   Treatment provided Cognitive-Linquistic      Cognitive-Linquistic Treatment   Treatment focused on Cognition    Skilled Treatment Focused on organizational skills to assist with paying bills at home. External organization - consistency, accessibibility, grouping, separation, and proximity. Spoke about ways she can use at home.      Assessment / Recommendations / Plan   Plan Continue with current plan of care      Progression Toward Goals   Progression toward goals Progressing toward goals              SLP Short Term Goals - 06/03/20 1101      SLP SHORT TERM GOAL #1   Title Pt will utilize word finding strategies (of a list) given a verbal prompt to increase word finding ability.    Time 4    Period Weeks    Status On-going      SLP SHORT TERM GOAL #2   Title Pt will recall 2 memory strategies to assist with medication and financial management.    Time 4    Period Weeks    Status On-going  SLP SHORT TERM GOAL #3   Title Patient will demo attention to detail in school/work-like material with double checking/self-corrections.    Time 4    Period Weeks    Status On-going      SLP SHORT TERM GOAL #4   Title Patient will complete planning/organization exercises with mod verbal and visual cues.    Time 4    Period Weeks    Status On-going            SLP Long Term Goals - 06/03/20 1101      SLP LONG TERM GOAL #1   Title Patient will participate in restorative and compensatory therapy to improve verbal expression by incorporating strategies to communicate wants and needs effectively to different conversational partners, maintain safety and participate socially in their functional living environment.    Time 10    Period Weeks    Status On-going      SLP LONG TERM GOAL #2   Title Patient will demonstrate use of memory strategies  to schedule activities, recall weekly events and items to maintain safety to participate socially in functional living environment    Time 10    Period Weeks    Status On-going      SLP LONG TERM GOAL #3   Title Patient will increase information processing speed during planning and organization daily living activities to improve safety and awareness in functional living environment    Time 10    Period Weeks    Status New      SLP LONG TERM GOAL #4   Title Patient will develop functional attention skills to effectively attend to and communicate in tasks of daily living in their functional living environment.    Time 10    Period Weeks    Status On-going            Plan - 06/03/20 1101    Clinical Impression Statement Pt is a 45 yo female with hx significant for RCVA. 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 She would like to get back to work and continue pursuing her bachelors degree. Since the CVA, patient reports trouble with recalling words and difficulty holding information in her head. Cognitive-communication was assessed this date using the SLUMS. Patient scored a 17/30 indicating a moderate impairment. Difficulty noted in working memory, short term memory, attention, and generative naming. Informal assessment of receptive and expressive language skills noted rare anomia, but more disorganized thought processes. Given extra time, patient was able to achieve the correct word. SLP rec skilled speech services to address expressive aphasia and cognitive-communication impairment to achieve participation in all ADLs.    Speech Therapy Frequency 2x / week    Duration 12 weeks    Treatment/Interventions Compensatory strategies;Cueing hierarchy;Functional tasks;Patient/family education;Environmental controls;Cognitive reorganization;Multimodal communcation approach;Language facilitation;Compensatory techniques;Internal/external aids;SLP  instruction and feedback    Potential to Achieve Goals Good    SLP Home Exercise Plan Word finding    Consulted and Agree with Plan of Care Patient           Patient will benefit from skilled therapeutic intervention in order to improve the following deficits and impairments:   Cognitive communication deficit  Aphasia    Problem List Patient Active Problem List   Diagnosis Date Noted  . Cryptogenic stroke (HCC) - R frontal lobe 05/31/2020  . Suspected sleep apnea 05/31/2020  . PFO (patent foramen ovale) - ROPE score 5 05/31/2020  . Tobacco  dependence due to cigarettes 05/31/2020  . Snoring 05/31/2020  . Cerebrovascular accident (CVA) due to embolism of anterior cerebral artery (HCC)   . Encounter for general adult medical examination with abnormal findings 04/13/2020  . Visit for screening mammogram 04/13/2020  . Moderate episode of recurrent major depressive disorder (HCC) 04/09/2020  . 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 Cerebral thrombosis with cerebral infarction 04/04/2020  . Hypokalemia 04/03/2020  . Left leg weakness 04/02/2020  . Hypertension   . Depression   . Anxiety 11/27/2017  . Vitamin D deficiency 06/07/2017  . History of prior pregnancy with IUGR newborn 06/04/2017  . Anxiety, generalized 02/09/2016    Dorena Bodo MS, Pink, CBIS  06/03/2020, 11:02 AM  [facility] [address] [place], [place], [address] Phone: [phone]   Fax:  [phone]   Name: Makaylee Spielberg MRN: [id number] Date of Birth: 06/13/1975

## 2020-06-10 ENCOUNTER — Ambulatory Visit: Payer: 59 | Admitting: Speech Pathology

## 2020-06-10 ENCOUNTER — Other Ambulatory Visit: Payer: Self-pay

## 2020-06-10 ENCOUNTER — Encounter: Payer: Self-pay | Admitting: Speech Pathology

## 2020-06-10 DIAGNOSIS — R41841 Cognitive communication deficit: Secondary | ICD-10-CM

## 2020-06-10 DIAGNOSIS — R4701 Aphasia: Secondary | ICD-10-CM

## 2020-06-10 NOTE — Therapy (Signed)
[facility] [address] [place], [place], [address] Phone: [phone]   Fax:  [phone]  Speech Language Pathology Treatment  Patient Details  Name: Tracey [name] MRN: [id number] Date of Birth: Apr 02, 1975 Referring Provider (SLP): Etta Grandchild   Encounter Date: 06/10/2020   End of Session - 06/10/20 1032    Visit Number 7    Number of Visits 25    Date for SLP Re-Evaluation 08/02/19    SLP Start Time 1025    SLP Stop Time  1100    SLP Time Calculation (min) 35 min    Activity Tolerance Patient tolerated treatment well;Patient limited by fatigue           Past Medical History:  Diagnosis Date  . Anxiety   . Bronchitis   . Carpal tunnel syndrome on both sides   . Depression   . Family history of adverse reaction to anesthesia    mother had problems waking up from surgery.  . Gestational diabetes mellitus    Normal 2 hr GTT postpartum  . Headache    migraines  . Hypertension   . Oligohydramnios antepartum 11/09/2017  . Panic attack 2017   diagnosed 3 months ago. no meds presently  . Reflux    did not filll prescription    Past Surgical History:  Procedure Laterality Date  . BREAST BIOPSY  right    cyst  . BUBBLE STUDY  04/16/2020   Procedure: BUBBLE STUDY;  Surgeon: Chilton Si, MD;  Location: [facility] ENDOSCOPY;  Service: Cardiovascular;;  . DILATION AND EVACUATION N/A 07/26/2015   Procedure: DILATATION AND EVACUATION;  Surgeon: Brock Bad, MD;  Location: [facility] ORS;  Service: Gynecology;  Laterality: N/A;  . TEE WITHOUT CARDIOVERSION N/A 04/16/2020   Procedure: TRANSESOPHAGEAL ECHOCARDIOGRAM (TEE);  Surgeon: Chilton Si, MD;  Location: [facility] ENDOSCOPY;  Service: Cardiovascular;  Laterality: N/A;    There were no vitals filed for this visit.   Subjective Assessment - 06/10/20 1029    Subjective "Things are going pretty good."    Patient is accompained by: Family member    Currently in Pain?  No/denies                 ADULT SLP TREATMENT - 06/10/20 1045      General Information   Behavior/Cognition Alert;Cooperative;Pleasant mood      Cognitive-Linquistic Treatment   Treatment focused on Cognition;Aphasia    Skilled Treatment Utilized Expanding Expression Tool to assist with thought organization and word finding. Pt required minA to complete semantic feature analysis. Required cueing to provide more information and to think "ouside of the box" to incorporate more abstract information. This tool benefited pt by creating structure to her flight of ideas.      Assessment / Recommendations / Plan   Plan Continue with current plan of care      Progression Toward Goals   Progression toward goals Progressing toward goals            SLP Education - 06/10/20 1029    Education Details Provided edu on organization strategies.    Person(s) Educated Patient    Methods Explanation;Demonstration    Comprehension Verbalized understanding            SLP Short Term Goals - 06/10/20 1041      SLP SHORT TERM GOAL #1   Title Pt will utilize word finding strategies (of a list) given a verbal prompt to increase word finding ability.  Time 3    Period Weeks    Status On-going      SLP SHORT TERM GOAL #2   Title Pt will recall 2 memory strategies to assist with medication and financial management.    Time 3    Period Weeks    Status On-going      SLP SHORT TERM GOAL #3   Title Patient will demo attention to detail in school/work-like material with double checking/self-corrections.    Time 3    Period Weeks    Status On-going      SLP SHORT TERM GOAL #4   Title Patient will complete planning/organization exercises with mod verbal and visual cues.    Time 3    Period Weeks    Status On-going            SLP Long Term Goals - 06/10/20 1041      SLP LONG TERM GOAL #1   Title Patient will participate in restorative and compensatory therapy to improve verbal  expression by incorporating strategies to communicate wants and needs effectively to different conversational partners, maintain safety and participate socially in their functional living environment.    Time 9    Period Weeks    Status On-going      SLP LONG TERM GOAL #2   Title Patient will demonstrate use of memory strategies to schedule activities, recall weekly events and items to maintain safety to participate socially in functional living environment    Time 9    Period Weeks    Status On-going      SLP LONG TERM GOAL #3   Title Patient will increase information processing speed during planning and organization daily living activities to improve safety and awareness in functional living environment    Time 9    Period Weeks    Status New      SLP LONG TERM GOAL #4   Title Patient will develop functional attention skills to effectively attend to and communicate in tasks of daily living in their functional living environment.    Time 9    Period Weeks    Status On-going            Plan - 06/10/20 1038    Clinical Impression Statement Pt is a 45 yo female with hx significant for RCVA. 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 She would like to get back to work and continue pursuing her bachelors degree. Since the CVA, patient reports trouble with recalling words and difficulty holding information in her head. Cognitive-communication was assessed this date using the SLUMS. Patient scored a 17/30 indicating a moderate impairment. Difficulty noted in working memory, short term memory, attention, and generative naming. Informal assessment of receptive and expressive language skills noted rare anomia, but more disorganized thought processes. Given extra time, patient was able to achieve the correct word. SLP rec skilled speech services to address expressive aphasia and cognitive-communication impairment to achieve participation in all  ADLs.    Speech Therapy Frequency 2x / week    Duration 12 weeks    Treatment/Interventions Compensatory strategies;Cueing hierarchy;Functional tasks;Patient/family education;Environmental controls;Cognitive reorganization;Multimodal communcation approach;Language facilitation;Compensatory techniques;Internal/external aids;SLP instruction and feedback    Potential to Achieve Goals Good    Consulted and Agree with Plan of Care Patient           Patient will benefit from skilled therapeutic intervention in order to improve the following deficits and impairments:  Cognitive communication deficit  Aphasia    Problem List Patient Active Problem List   Diagnosis Date Noted  . Cryptogenic stroke (HCC) - R frontal lobe 05/31/2020  . Suspected sleep apnea 05/31/2020  . PFO (patent foramen ovale) - ROPE score 5 05/31/2020  . Tobacco dependence due to cigarettes 05/31/2020  . Snoring 05/31/2020  . Cerebrovascular accident (CVA) due to embolism of anterior cerebral artery (HCC)   . Encounter for general adult medical examination with abnormal findings 04/13/2020  . Visit for screening mammogram 04/13/2020  . Moderate episode of recurrent major depressive disorder (HCC) 04/09/2020  . 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 Cerebral thrombosis with cerebral infarction 04/04/2020  . Hypokalemia 04/03/2020  . Left leg weakness 04/02/2020  . Hypertension   . Depression   . Anxiety 11/27/2017  . Vitamin D deficiency 06/07/2017  . History of prior pregnancy with IUGR newborn 06/04/2017  . Anxiety, generalized 02/09/2016    Dorena Bodo MS, [place], CBIS  06/10/2020, 11:15 AM  [facility] [address] [place], [place], [address] Phone: [phone]   Fax:  [phone]   Name: Tracey [name] MRN: [id number] Date of Birth: [birth year]

## 2020-06-10 NOTE — Patient Instructions (Signed)
Pull out your attention strategies and review tonight.   PUT YOUR PLANNER IN YOUR BAG! :)

## 2020-06-13 ENCOUNTER — Ambulatory Visit (INDEPENDENT_AMBULATORY_CARE_PROVIDER_SITE_OTHER): Payer: 59 | Admitting: Neurology

## 2020-06-13 ENCOUNTER — Other Ambulatory Visit: Payer: Self-pay

## 2020-06-13 DIAGNOSIS — G478 Other sleep disorders: Secondary | ICD-10-CM

## 2020-06-13 DIAGNOSIS — I639 Cerebral infarction, unspecified: Secondary | ICD-10-CM

## 2020-06-13 DIAGNOSIS — Q211 Atrial septal defect: Secondary | ICD-10-CM

## 2020-06-13 DIAGNOSIS — R29818 Other symptoms and signs involving the nervous system: Secondary | ICD-10-CM

## 2020-06-13 DIAGNOSIS — Q2112 Patent foramen ovale: Secondary | ICD-10-CM

## 2020-06-13 DIAGNOSIS — R0683 Snoring: Secondary | ICD-10-CM

## 2020-06-13 DIAGNOSIS — F17210 Nicotine dependence, cigarettes, uncomplicated: Secondary | ICD-10-CM

## 2020-06-14 ENCOUNTER — Ambulatory Visit: Payer: 59 | Admitting: Speech Pathology

## 2020-06-14 ENCOUNTER — Other Ambulatory Visit (HOSPITAL_COMMUNITY)
Admission: RE | Admit: 2020-06-14 | Discharge: 2020-06-14 | Disposition: A | Discharge status: Home / Self Care | Payer: 59 | Source: Ambulatory Visit | Attending: Cardiovascular Disease | Admitting: Cardiovascular Disease

## 2020-06-14 DIAGNOSIS — Z20822 Contact with and (suspected) exposure to covid-19: Secondary | ICD-10-CM | POA: Insufficient documentation

## 2020-06-14 DIAGNOSIS — Z01812 Encounter for preprocedural laboratory examination: Secondary | ICD-10-CM | POA: Diagnosis not present

## 2020-06-15 ENCOUNTER — Ambulatory Visit (INDEPENDENT_AMBULATORY_CARE_PROVIDER_SITE_OTHER): Payer: 59 | Admitting: Internal Medicine

## 2020-06-15 ENCOUNTER — Telehealth: Payer: Self-pay | Admitting: *Deleted

## 2020-06-15 ENCOUNTER — Other Ambulatory Visit: Payer: Self-pay

## 2020-06-15 ENCOUNTER — Encounter: Payer: Self-pay | Admitting: Internal Medicine

## 2020-06-15 ENCOUNTER — Other Ambulatory Visit (HOSPITAL_COMMUNITY): Payer: 59

## 2020-06-15 DIAGNOSIS — M79671 Pain in right foot: Secondary | ICD-10-CM | POA: Diagnosis not present

## 2020-06-15 LAB — SARS CORONAVIRUS 2 (TAT 6-24 HRS): SARS Coronavirus 2: NEGATIVE

## 2020-06-15 NOTE — Patient Instructions (Addendum)
You likely have a bruise in the foot. You can use ice and tylenol to help. This should be better in a week or two.

## 2020-06-15 NOTE — Progress Notes (Signed)
   Subjective:   Patient ID: Tracey [name], female    DOB: 1975/12/16, 45 y.o.   MRN: [id number]  HPI The patient is a 45 YO female coming in for concerns about right foot pain. She was washing car on Sunday and hit the foot with stepping on something. Was originally concerned this may have penetrated her foot however that did not occur. She did not have pain immediately afterwards. She is having soreness today and pain when moving it certain ways. She has not taken anything for this. Denies swelling. Denies color change.   Review of Systems  Constitutional: Negative.   HENT: Negative.   Eyes: Negative.   Respiratory: Negative for cough, chest tightness and shortness of breath.   Cardiovascular: Negative for chest pain, palpitations and leg swelling.  Gastrointestinal: Negative for abdominal distention, abdominal pain, constipation, diarrhea, nausea and vomiting.  Musculoskeletal: Positive for myalgias.  Skin: Negative.   Neurological: Negative.   Psychiatric/Behavioral: Negative.     Objective:  Physical Exam Constitutional:      Appearance: She is well-developed.  HENT:     Head: Normocephalic and atraumatic.  Cardiovascular:     Rate and Rhythm: Normal rate and regular rhythm.  Pulmonary:     Effort: Pulmonary effort is normal. No respiratory distress.     Breath sounds: Normal breath sounds. No wheezing or rales.  Abdominal:     General: Bowel sounds are normal. There is no distension.     Palpations: Abdomen is soft.     Tenderness: There is no abdominal tenderness. There is no rebound.  Musculoskeletal:        General: Tenderness present.     Cervical back: Normal range of motion.     Comments: Mild tenderness along 4-5th tendon to palpation, ROM intact at the ankle. No swelling noted.   Skin:    General: Skin is warm and dry.  Neurological:     Mental Status: She is alert and oriented to person, place, and time.     Coordination: Coordination normal.    Vitals:    06/15/20 1441  BP: 136/82  Pulse: 90  Temp: 99 F (37.2 C)  TempSrc: Oral  SpO2: 99%  Weight: 213 lb (96.6 kg)  Height: 5\' 6"  (1.676 m)   This visit occurred during the SARS-CoV-2 public health emergency.  Safety protocols were in place, including screening questions prior to the visit, additional usage of staff PPE, and extensive cleaning of exam room while observing appropriate contact time as indicated for disinfecting solutions.   Assessment & Plan:  Visit time 15 minutes in face to face communication with patient and coordination of care, additional 5 minutes spent in record review, coordination or care, ordering tests, communicating/referring to other healthcare professionals, documenting in medical records all on the same day of the visit for total time 20 minutes spent on the visit.

## 2020-06-15 NOTE — Assessment & Plan Note (Signed)
Advised RICE. No physical exam findings to indicate need for x-ray. Can use ice and tylenol. Given she is on aspirin and plavix would avoid nsaids.

## 2020-06-15 NOTE — Telephone Encounter (Signed)
Pt contacted pre-PFO scheduled at [facility] for: Wednesday June 16, 2020 8:30 AM Verified arrival time and place: [facility] Main Entrance [facility]) at: 6:30 AM   No solid food after midnight prior to cath, clear liquids until 5 AM day of procedure.  AM meds can be  taken pre-cath with sips of water including: ASA 81 mg Plavix 75 mg  Confirmed patient has responsible adult to drive home post procedure and be with patient first 24 hours after arriving home: yes  You are allowed ONE visitor in the waiting room during the time you are at the hospital for your procedure. Both you and your visitor must wear a mask once you enter the hospital.   Reviewed procedure/mask/visitor instructions with patient.

## 2020-06-16 ENCOUNTER — Ambulatory Visit (HOSPITAL_BASED_OUTPATIENT_CLINIC_OR_DEPARTMENT_OTHER): Payer: 59

## 2020-06-16 ENCOUNTER — Ambulatory Visit (HOSPITAL_COMMUNITY)
Admission: RE | Disposition: A | Discharge status: Home / Self Care | Payer: 59 | Source: Home / Self Care | Attending: Cardiovascular Disease

## 2020-06-16 ENCOUNTER — Encounter (HOSPITAL_COMMUNITY): Payer: Self-pay | Admitting: Cardiovascular Disease

## 2020-06-16 ENCOUNTER — Ambulatory Visit (HOSPITAL_COMMUNITY)
Admission: RE | Admit: 2020-06-16 | Discharge: 2020-06-17 | Disposition: A | Discharge status: Home / Self Care | Payer: 59 | Attending: Cardiovascular Disease | Admitting: Cardiovascular Disease

## 2020-06-16 DIAGNOSIS — Z8774 Personal history of (corrected) congenital malformations of heart and circulatory system: Secondary | ICD-10-CM

## 2020-06-16 DIAGNOSIS — F419 Anxiety disorder, unspecified: Secondary | ICD-10-CM | POA: Diagnosis not present

## 2020-06-16 DIAGNOSIS — Z7982 Long term (current) use of aspirin: Secondary | ICD-10-CM | POA: Diagnosis not present

## 2020-06-16 DIAGNOSIS — I639 Cerebral infarction, unspecified: Secondary | ICD-10-CM | POA: Diagnosis present

## 2020-06-16 DIAGNOSIS — F17210 Nicotine dependence, cigarettes, uncomplicated: Secondary | ICD-10-CM | POA: Diagnosis not present

## 2020-06-16 DIAGNOSIS — Q211 Atrial septal defect: Secondary | ICD-10-CM | POA: Insufficient documentation

## 2020-06-16 DIAGNOSIS — F32A Depression, unspecified: Secondary | ICD-10-CM | POA: Diagnosis not present

## 2020-06-16 DIAGNOSIS — Z79899 Other long term (current) drug therapy: Secondary | ICD-10-CM | POA: Insufficient documentation

## 2020-06-16 DIAGNOSIS — Q2112 Patent foramen ovale: Secondary | ICD-10-CM

## 2020-06-16 DIAGNOSIS — I10 Essential (primary) hypertension: Secondary | ICD-10-CM | POA: Insufficient documentation

## 2020-06-16 DIAGNOSIS — I6330 Cerebral infarction due to thrombosis of unspecified cerebral artery: Secondary | ICD-10-CM | POA: Diagnosis present

## 2020-06-16 DIAGNOSIS — Z8673 Personal history of transient ischemic attack (TIA), and cerebral infarction without residual deficits: Secondary | ICD-10-CM | POA: Diagnosis present

## 2020-06-16 HISTORY — PX: PATENT FORAMEN OVALE(PFO) CLOSURE: CATH118300

## 2020-06-16 HISTORY — DX: Personal history of (corrected) congenital malformations of heart and circulatory system: Z87.74

## 2020-06-16 LAB — ECHOCARDIOGRAM LIMITED
AV Mean grad: 5 mmHg
AV Peak grad: 8.1 mmHg
Ao pk vel: 1.42 m/s
Area-P 1/2: 4.63 cm2
Calc EF: 65.3 %
Height: 66 in
Single Plane A2C EF: 71.2 %
Single Plane A4C EF: 58.1 %
Weight: 3472 oz

## 2020-06-16 LAB — PREGNANCY, URINE: Preg Test, Ur: NEGATIVE

## 2020-06-16 SURGERY — PATENT FORAMEN OVALE (PFO) CLOSURE
Anesthesia: LOCAL

## 2020-06-16 MED ORDER — SODIUM CHLORIDE 0.9% FLUSH
3.0000 mL | Freq: Two times a day (BID) | INTRAVENOUS | Status: DC
  Administered 2020-06-16: 3 mL via INTRAVENOUS

## 2020-06-16 MED ORDER — LIDOCAINE HCL (PF) 1 % IJ SOLN
INTRAMUSCULAR | Status: AC
  Filled 2020-06-16: qty 30

## 2020-06-16 MED ORDER — SODIUM CHLORIDE 0.9% FLUSH: 3.0000 mL | INTRAVENOUS | Status: DC | PRN

## 2020-06-16 MED ORDER — FENTANYL CITRATE (PF) 100 MCG/2ML IJ SOLN
INTRAMUSCULAR | Status: DC | PRN
  Administered 2020-06-16 (×2): 25 ug via INTRAVENOUS

## 2020-06-16 MED ORDER — HEPARIN (PORCINE) IN NACL 1000-0.9 UT/500ML-% IV SOLN
INTRAVENOUS | Status: DC | PRN
  Administered 2020-06-16 (×3): 500 mL

## 2020-06-16 MED ORDER — LIDOCAINE-EPINEPHRINE 1 %-1:100000 IJ SOLN
INTRAMUSCULAR | Status: AC
  Filled 2020-06-16: qty 1

## 2020-06-16 MED ORDER — LIDOCAINE HCL (PF) 1 % IJ SOLN
INTRAMUSCULAR | Status: DC | PRN
  Administered 2020-06-16: 15 mL

## 2020-06-16 MED ORDER — SODIUM CHLORIDE 0.9 % IV SOLN
250.0000 mL | INTRAVENOUS | Status: DC | PRN
Start: 2020-06-16 — End: 2020-06-17

## 2020-06-16 MED ORDER — HEPARIN SODIUM (PORCINE) 1000 UNIT/ML IJ SOLN
INTRAMUSCULAR | Status: AC
  Filled 2020-06-16: qty 1

## 2020-06-16 MED ORDER — DIAZEPAM 5 MG PO TABS: 5.0000 mg | ORAL_TABLET | Freq: Four times a day (QID) | ORAL | Status: DC | PRN

## 2020-06-16 MED ORDER — ONDANSETRON HCL 4 MG/2ML IJ SOLN: 4.0000 mg | Freq: Four times a day (QID) | INTRAMUSCULAR | Status: DC | PRN

## 2020-06-16 MED ORDER — LIDOCAINE-EPINEPHRINE 1 %-1:100000 IJ SOLN
10.0000 mL | Freq: Once | INTRAMUSCULAR | Status: AC
  Administered 2020-06-16: 10 mL via INTRADERMAL

## 2020-06-16 MED ORDER — CEFAZOLIN SODIUM-DEXTROSE 2-4 GM/100ML-% IV SOLN
INTRAVENOUS | Status: AC
  Filled 2020-06-16: qty 100

## 2020-06-16 MED ORDER — SODIUM CHLORIDE 0.9 % WEIGHT BASED INFUSION: 1.0000 mL/kg/h | INTRAVENOUS | Status: DC

## 2020-06-16 MED ORDER — OXYCODONE HCL 5 MG PO TABS: 5.0000 mg | ORAL_TABLET | ORAL | Status: DC | PRN

## 2020-06-16 MED ORDER — SODIUM CHLORIDE 0.9% FLUSH
3.0000 mL | Freq: Two times a day (BID) | INTRAVENOUS | Status: DC
  Administered 2020-06-16 – 2020-06-17 (×2): 3 mL via INTRAVENOUS

## 2020-06-16 MED ORDER — CEFAZOLIN SODIUM-DEXTROSE 2-4 GM/100ML-% IV SOLN
2.0000 g | INTRAVENOUS | Status: AC
  Administered 2020-06-16: 2 g via INTRAVENOUS

## 2020-06-16 MED ORDER — LABETALOL HCL 5 MG/ML IV SOLN: 10.0000 mg | INTRAVENOUS | Status: DC | PRN

## 2020-06-16 MED ORDER — SODIUM CHLORIDE 0.9 % IV SOLN: 250.0000 mL | INTRAVENOUS | Status: DC | PRN

## 2020-06-16 MED ORDER — MIDAZOLAM HCL 2 MG/2ML IJ SOLN
INTRAMUSCULAR | Status: AC
  Filled 2020-06-16: qty 2

## 2020-06-16 MED ORDER — CLOPIDOGREL BISULFATE 75 MG PO TABS
75.0000 mg | ORAL_TABLET | ORAL | Status: DC
  Filled 2020-06-16: qty 1

## 2020-06-16 MED ORDER — ASPIRIN 81 MG PO CHEW
81.0000 mg | CHEWABLE_TABLET | ORAL | Status: DC
  Filled 2020-06-16: qty 1

## 2020-06-16 MED ORDER — SODIUM CHLORIDE 0.9 % IV SOLN: INTRAVENOUS | Status: DC

## 2020-06-16 MED ORDER — HYDRALAZINE HCL 20 MG/ML IJ SOLN: 10.0000 mg | INTRAMUSCULAR | Status: DC | PRN

## 2020-06-16 MED ORDER — ACETAMINOPHEN 325 MG PO TABS
650.0000 mg | ORAL_TABLET | ORAL | Status: DC | PRN
  Administered 2020-06-16: 650 mg via ORAL
  Filled 2020-06-16: qty 2

## 2020-06-16 MED ORDER — HEPARIN (PORCINE) IN NACL 1000-0.9 UT/500ML-% IV SOLN
INTRAVENOUS | Status: AC
  Filled 2020-06-16: qty 1000

## 2020-06-16 MED ORDER — MIDAZOLAM HCL 2 MG/2ML IJ SOLN
INTRAMUSCULAR | Status: DC | PRN
  Administered 2020-06-16 (×2): 2 mg via INTRAVENOUS

## 2020-06-16 MED ORDER — SODIUM CHLORIDE 0.9 % WEIGHT BASED INFUSION
3.0000 mL/kg/h | INTRAVENOUS | Status: DC
  Administered 2020-06-16: 3 mL/kg/h via INTRAVENOUS

## 2020-06-16 MED ORDER — HEPARIN SODIUM (PORCINE) 1000 UNIT/ML IJ SOLN
INTRAMUSCULAR | Status: DC | PRN
  Administered 2020-06-16: 8000 [IU] via INTRAVENOUS

## 2020-06-16 MED ORDER — FENTANYL CITRATE (PF) 100 MCG/2ML IJ SOLN
INTRAMUSCULAR | Status: AC
  Filled 2020-06-16: qty 2

## 2020-06-16 SURGICAL SUPPLY — 17 items
CATH ACUNAV REPROCESSED (CATHETERS) ×1 IMPLANT
CATH INFINITI 6F MPA2 100CM (CATHETERS) ×1 IMPLANT
COVER SWIFTLINK CONNECTOR (BAG) ×1 IMPLANT
GUIDEWIRE AMPLATZER 1.5JX260 (WIRE) ×1 IMPLANT
KIT HEART LEFT (KITS) ×2 IMPLANT
OCCLUDER PFO TALISMAN 25-18 (Prosthesis & Implant Heart) IMPLANT
PACK CARDIAC CATHETERIZATION (CUSTOM PROCEDURE TRAY) ×2 IMPLANT
PROTECTION STATION PRESSURIZED (MISCELLANEOUS) ×2
SHEATH DELIVERY TALISMAN 8F 80 (SHEATH) IMPLANT
SHEATH INTROD W/O MIN 9FR 25CM (SHEATH) ×1 IMPLANT
SHEATH PINNACLE 8F 10CM (SHEATH) ×1 IMPLANT
STATION PROTECTION PRESSURIZED (MISCELLANEOUS) IMPLANT
TALISMAN DELIVERY SHEATH 8F 80 (SHEATH) ×2
TALISMAN PFO OCCLUDER 25-18 (Prosthesis & Implant Heart) ×2 IMPLANT
TRANSDUCER W/STOPCOCK (MISCELLANEOUS) ×2 IMPLANT
TUBING CIL FLEX 10 FLL-RA (TUBING) ×2 IMPLANT
WIRE EMERALD 3MM-J .035X150CM (WIRE) ×1 IMPLANT

## 2020-06-16 NOTE — Discharge Instructions (Signed)

## 2020-06-16 NOTE — Interval H&P Note (Signed)
History and Physical Interval Note:  06/16/2020 8:42 AM  Tracey [name]  has presented today for surgery, with the diagnosis of PFO.  The various methods of treatment have been discussed with the patient and family. After consideration of risks, benefits and other options for treatment, the patient has consented to  Procedure(s): PATENT FORAMEN OVALE (PFO) CLOSURE (N/A) as a surgical intervention.  The patient's history has been reviewed, patient examined, no change in status, stable for surgery.  I have reviewed the patient's chart and labs.  Questions were answered to the patient's satisfaction.     Tonny Bollman

## 2020-06-16 NOTE — Progress Notes (Signed)
Right groin ooze post sheath removal.  10cc of lido/epi injected into track and massaged into area.  Ooze stopped and dressing applied.  No hematoma.

## 2020-06-16 NOTE — Progress Notes (Addendum)
Alert and oriented, denies pain, see flowsheet, increased amount of sg drainage noted to right groin and peri area, clots noted, Helmut Muster, tech from cath lab going to remove sheath x2, Purewick placed earlier, remains in place, sg drainage noted in purewick tubing, safety maintained

## 2020-06-16 NOTE — Progress Notes (Addendum)
SITE AREA: right groin/femoral  SITE PRIOR TO REMOVAL:  LEVEL 0  PRESSURE APPLIED FOR: approximately 60 minutes  MANUAL: yes  PATIENT STATUS DURING PULL: wnl  POST PULL SITE:  LEVEL 0  POST PULL INSTRUCTIONS GIVEN: yes  POST PULL PULSES PRESENT: bilateral pedal pulses at +2  DRESSING APPLIED: gauze with tegaderm  BEDREST BEGINS @ 1310  COMMENTS: site with a constant ooze, see rodney cox's note, peri care provided, linens changed

## 2020-06-17 ENCOUNTER — Encounter (HOSPITAL_COMMUNITY): Payer: Self-pay | Admitting: Cardiovascular Disease

## 2020-06-17 DIAGNOSIS — Q211 Atrial septal defect: Secondary | ICD-10-CM | POA: Diagnosis not present

## 2020-06-17 DIAGNOSIS — Z8774 Personal history of (corrected) congenital malformations of heart and circulatory system: Secondary | ICD-10-CM

## 2020-06-17 LAB — POCT ACTIVATED CLOTTING TIME
Activated Clotting Time: 297 seconds
Activated Clotting Time: 208 seconds
Activated Clotting Time: 178 seconds

## 2020-06-17 MED FILL — Cefazolin Sodium-Dextrose IV Solution 2 GM/100ML-4%: INTRAVENOUS | Qty: 100 | Status: AC

## 2020-06-17 NOTE — Plan of Care (Signed)
  Problem: Education: Goal: Knowledge of General Education information will improve Description: Including pain rating scale, medication(s)/side effects and non-pharmacologic comfort measures Outcome: Adequate for Discharge   

## 2020-06-17 NOTE — Progress Notes (Signed)
Patient received discharge instructions and stated understanding.  She stated that she would call her MD for any concerns once she returned home.

## 2020-06-17 NOTE — Discharge Summary (Addendum)
[facility]   MULTIDISCIPLINARY HEART VALVE TEAM  Discharge Summary    Patient ID: Tracey [name] MRN: [id number]; DOB: January 08, 1976  Admit date: 06/16/2020 Discharge date: 06/17/2020  Primary Care Provider: Etta Grandchild, MD  Primary Cardiologist: Dr. Shari Prows   Discharge Diagnoses    Principal Problem:   S/P percutaneous patent foramen ovale closure Active Problems:   Anxiety   Hypertension   Depression   Cryptogenic stroke (HCC) - R frontal lobe   PFO (patent foramen ovale) - ROPE score 5   Allergies No Known Allergies  Diagnostic Studies/Procedures    06/16/20 PATENT FORAMEN OVALE (PFO) CLOSURE  Conclusion SUCCESSFUL TRANSCATHETER PFO CLOSURE USING INTRACARDIAC ECHO AND FLUOROSCOPIC GUIDANCE. DEFECT CLOSED WITH A 25 MM AMPLATZER PFO OCCLUDER DEVICE  RECOMMEND:  ASA/CLOPIDOGREL X 6 MONTHS  SBE PROPHYLAXIS X 6 MONTHS  LIMITED 2D ECHO POST-PROCEDURE ASSESSMENT  _____________    Limited echo 06/16/2020 IMPRESSIONS  1. Left ventricular ejection fraction, by estimation, is 60 to 65%. The  left ventricle has normal function. The left ventricle has no regional  wall motion abnormalities.  2. Right ventricular systolic function is normal. The right ventricular  size is normal. There is normal pulmonary artery systolic pressure.  3. 25 mm Amplatzer occluder device in good position across septum No  obvious residual PFO Frame 15 had color flow at aortic edge of septum on  RA side but I believe this is caval flow.  4. The mitral valve is normal in structure. No evidence of mitral valve  regurgitation. No evidence of mitral stenosis.  5. The aortic valve is normal in structure. Aortic valve regurgitation is  not visualized. No aortic stenosis is present.  6. The inferior vena cava is normal in size with greater than 50%  respiratory variability, suggesting right atrial pressure of 3 mmHg.    History of Present Illness     Tracey [name] is a 45  y.o. female with a history of obesity, tobacco abuse, HTN, cryptogenic CVA and PFO who presented to [facility] on 06/16/20 for planned PFO closure.   She was diagnosed with HTN after she gave birth to their youngest child who is now 52 years old. She has been on antihypertensive Rx since that time. She is a current smoker who is tapering off in efforts to quit.  She presented April 02, 2020 with acute neck discomfort and left-sided numbness and weakness.  She was found to have a small acute infarct in the right posterior frontal lobe, felt to possibly be an embolic event.  Stroke evaluation revealed no evidence of large vessel vascular occlusive disease.  Hypercoagulable panel was negative.  Transcranial Doppler study was negative at rest but showed Spencer grade 4 right to left shunt with Valsalva suggestive of PFO.  A transesophageal echo confirmed PFO with color-flow and positive agitated saline study across the interatrial septum.  She was seen by Dr. Excell Seltzer for evaluation of PFO closure and this was set up for 06/16/2020.  Hospital Course     Consultants: none  PFO with cryptogenic CVA: pt underwent successful PFO closure with a 25 mm Amplatzer occluder device on 06/16/20 by Dr. Excell Seltzer. Post op echo showed EF 60%, and Amplatzer occluder device in good position across septum with no obvious residual PFO (frame 15 had color flow at aortic edge of septum on RA side but this was felt to be caval flow.) She will be continued on DAPT with aspirin and plavix for at least 6 months. She understands  the need for SBE prophylaxis x 6 months. Plan for discharge home today with close follow up in the office next month.    HTN: resume home meds  Cryptogenic CVA: continue antiplatelets and statin.  _____________  Discharge Vitals Blood pressure 135/76, pulse 68, temperature 98.5 F (36.9 C), temperature source Oral, resp. rate 18, height 5\' 6"  (1.676 m), weight 94.5 kg, SpO2 100 %.  Filed Weights   06/16/20 0651  06/17/20 0500  Weight: 98.4 kg 94.5 kg    Labs & Radiologic Studies    CBC No results for input(s): WBC, NEUTROABS, HGB, HCT, MCV, PLT in the last 72 hours. Basic Metabolic Panel No results for input(s): NA, K, CL, CO2, GLUCOSE, BUN, CREATININE, CALCIUM, MG, PHOS in the last 72 hours. Liver Function Tests No results for input(s): AST, ALT, ALKPHOS, BILITOT, PROT, ALBUMIN in the last 72 hours. No results for input(s): LIPASE, AMYLASE in the last 72 hours. Cardiac Enzymes No results for input(s): CKTOTAL, CKMB, CKMBINDEX, TROPONINI in the last 72 hours. BNP Invalid input(s): POCBNP D-Dimer No results for input(s): DDIMER in the last 72 hours. Hemoglobin A1C No results for input(s): HGBA1C in the last 72 hours. Fasting Lipid Panel No results for input(s): CHOL, HDL, LDLCALC, TRIG, CHOLHDL, LDLDIRECT in the last 72 hours. Thyroid Function Tests No results for input(s): TSH, T4TOTAL, T3FREE, THYROIDAB in the last 72 hours.  Invalid input(s): FREET3 _____________  CARDIAC CATHETERIZATION  Result Date: 06/16/2020 SUCCESSFUL TRANSCATHETER PFO CLOSURE USING INTRACARDIAC ECHO AND FLUOROSCOPIC GUIDANCE. DEFECT CLOSED WITH A 25 MM AMPLATZER PFO OCCLUDER DEVICE RECOMMEND:  ASA/CLOPIDOGREL X 6 MONTHS  SBE PROPHYLAXIS X 6 MONTHS  LIMITED 2D ECHO POST-PROCEDURE ASSESSMENT  Cardiac event monitor  Result Date: 05/20/2020  Patient's monitoring period was from 04/16/20-05/15/20  Predominant rhythm was NSR with average HR 69bpm ranging from 45bpm to 127bpm.  There were rare SVEs (<1%), occasional PVCs (1%, [id number])  No afib, significant pauses or VT  Overall, no significant arrhythmias detected on the monitor  05/17/20, MD   ECHOCARDIOGRAM LIMITED  Result Date: 06/16/2020    ECHOCARDIOGRAM LIMITED REPORT   Patient Name:   Tracey [name] Date of Exam: 06/16/2020 Medical Rec #:  06/18/2020    Height:       66.0 in Accession #:    [id number]   Weight:       217.0 lb Date of Birth:  Feb 08, 1976    BSA:          2.070 m Patient Age:    44 years     BP:           119/75 mmHg Patient Gender: F            HR:           52 bpm. Exam Location:  Inpatient Procedure: Limited Echo, Cardiac Doppler and Color Doppler Indications:    S/P PFO closure  History:        Patient has prior history of Echocardiogram examinations, most                 recent 04/16/2020. Risk Factors:Hypertension and Diabetes.  Sonographer:    04/18/2020 RDCS Referring Phys: 88 Isatu Macinnes IMPRESSIONS  1. Left ventricular ejection fraction, by estimation, is 60 to 65%. The left ventricle has normal function. The left ventricle has no regional wall motion abnormalities.  2. Right ventricular systolic function is normal. The right ventricular size is normal. There is normal pulmonary artery systolic pressure.  3. 25  mm Amplatzer occluder device in good position across septum No obvious residual PFO Frame 15 had color flow at aortic edge of septum on RA side but I believe this is caval flow.  4. The mitral valve is normal in structure. No evidence of mitral valve regurgitation. No evidence of mitral stenosis.  5. The aortic valve is normal in structure. Aortic valve regurgitation is not visualized. No aortic stenosis is present.  6. The inferior vena cava is normal in size with greater than 50% respiratory variability, suggesting right atrial pressure of 3 mmHg. FINDINGS  Left Ventricle: Left ventricular ejection fraction, by estimation, is 60 to 65%. The left ventricle has normal function. The left ventricle has no regional wall motion abnormalities. The left ventricular internal cavity size was normal in size. There is  no left ventricular hypertrophy. Right Ventricle: The right ventricular size is normal. No increase in right ventricular wall thickness. Right ventricular systolic function is normal. There is normal pulmonary artery systolic pressure. The tricuspid regurgitant velocity is 2.32 m/s, and  with an assumed right atrial  pressure of 3 mmHg, the estimated right ventricular systolic pressure is 24.5 mmHg. Left Atrium: Left atrial size was normal in size. Right Atrium: Right atrial size was normal in size. Pericardium: There is no evidence of pericardial effusion. Mitral Valve: The mitral valve is normal in structure. No evidence of mitral valve stenosis. Tricuspid Valve: The tricuspid valve is normal in structure. Tricuspid valve regurgitation is mild . No evidence of tricuspid stenosis. Aortic Valve: The aortic valve is normal in structure. Aortic valve regurgitation is not visualized. No aortic stenosis is present. Aortic valve mean gradient measures 5.0 mmHg. Aortic valve peak gradient measures 8.1 mmHg. Pulmonic Valve: The pulmonic valve was not well visualized. Pulmonic valve regurgitation is not visualized. No evidence of pulmonic stenosis. Aorta: The aortic root is normal in size and structure. Venous: The inferior vena cava is normal in size with greater than 50% respiratory variability, suggesting right atrial pressure of 3 mmHg. IAS/Shunts: No atrial level shunt detected by color flow Doppler.  LV Volumes (MOD) LV vol d, MOD A2C: 70.4 ml LV vol d, MOD A4C: 71.2 ml LV vol s, MOD A2C: 20.3 ml LV vol s, MOD A4C: 29.8 ml LV SV MOD A2C:     50.1 ml LV SV MOD A4C:     71.2 ml LV SV MOD BP:      46.9 ml AORTIC VALVE                    PULMONIC VALVE AV Vmax:           142.00 cm/s  PV Vmax:       0.93 m/s AV Vmean:          101.000 cm/s PV Vmean:      68.000 cm/s AV VTI:            0.331 m      PV VTI:        0.214 m AV Peak Grad:      8.1 mmHg     PV Peak grad:  3.4 mmHg AV Mean Grad:      5.0 mmHg     PV Mean grad:  2.0 mmHg LVOT Vmax:         99.90 cm/s LVOT Vmean:        64.300 cm/s LVOT VTI:          0.201 m LVOT/AV VTI ratio: 0.61 MITRAL  VALVE                TRICUSPID VALVE MV Area (PHT): 4.63 cm     TR Peak grad:   21.5 mmHg MV Decel Time: 164 msec     TR Vmax:        232.00 cm/s MV E velocity: 109.00 cm/s MV A velocity:  50.10 cm/s   SHUNTS MV E/A ratio:  2.18         Systemic VTI: 0.20 m Charlton HawsPeter Nishan MD Electronically signed by Charlton HawsPeter Nishan MD Signature Date/Time: 06/16/2020/3:00:03 PM    Final    Disposition   Pt is being discharged home today in good condition.  Follow-up Plans & Appointments     Follow-up Information    Janetta Horahompson, Kathryn R, PA-C. Go on 07/15/2020.   Specialties: Cardiology, Radiology Why: @ 1:30pm. Please arrive at least 10 minutes early.  Contact information: [address] [place] [id number] [phone]                Discharge Medications   Allergies as of 06/17/2020   No Known Allergies     Medication List    TAKE these medications   aspirin 81 MG EC tablet Take 81 mg by mouth daily. Swallow whole.   buPROPion 75 MG tablet Commonly known as: WELLBUTRIN Take 1 tablet (75 mg total) by mouth 2 (two) times daily.   clopidogrel 75 MG tablet Commonly known as: PLAVIX Take 1 tablet (75 mg total) by mouth daily.   COD LIVER OIL PO Take 1 capsule by mouth daily.   lisinopril 20 MG tablet Commonly known as: ZESTRIL Take 1.5 tablets (30 mg total) by mouth daily.   multivitamin with minerals tablet Take 1 tablet by mouth daily.   PROBIOTIC DAILY PO Take 1 tablet by mouth daily.   rosuvastatin 10 MG tablet Commonly known as: CRESTOR Take 1 tablet (10 mg total) by mouth daily.         Outstanding Labs/Studies   none  Duration of Discharge Encounter   Greater than 30 minutes including physician time.  Byrd HesselbachSigned, Kathryn Thompson, PA-C 06/17/2020, 10:08 AM [phone]  Patient seen, examined. Available data reviewed. Agree with findings, assessment, and plan as outlined by Carlean JewsKatie Thompson, PA-C. On exam, pt is A&Ox3, NAD, lungsCTA, heart RRR no murmur, abd: soft, NT, ext: no edema, right groin site clear. Tele shows NSR with no significant arrhythmia. Echo shows no effusion and PFO occluder in appropriate position with no residual  color flow. Medically stable for DC with follow-up plans and medications as outlined above.   Tonny BollmanMichael Izaah Westman, M.D. 06/17/2020 10:07 PM

## 2020-06-21 ENCOUNTER — Other Ambulatory Visit: Payer: Self-pay

## 2020-06-21 ENCOUNTER — Encounter: Payer: Self-pay | Admitting: Speech Pathology

## 2020-06-21 ENCOUNTER — Ambulatory Visit: Payer: 59 | Attending: Internal Medicine | Admitting: Speech Pathology

## 2020-06-21 DIAGNOSIS — R41841 Cognitive communication deficit: Secondary | ICD-10-CM | POA: Insufficient documentation

## 2020-06-21 DIAGNOSIS — R4701 Aphasia: Secondary | ICD-10-CM | POA: Diagnosis present

## 2020-06-21 NOTE — Therapy (Signed)
[facility] [address] [place], [place], [address] Phone: [phone]   Fax:  [phone]  Speech Language Pathology Treatment  Patient Details  Name: Tracey [name] MRN: [id number] Date of Birth: [birth year] Referring Provider (SLP): Etta Grandchild   Encounter Date: 06/21/2020   End of Session - 06/21/20 0823    Visit Number 8    Number of Visits 25    Date for SLP Re-Evaluation 08/02/19    SLP Start Time 0802   Pt late   SLP Stop Time  0842    SLP Time Calculation (min) 40 min    Activity Tolerance Patient tolerated treatment well;Patient limited by fatigue           Past Medical History:  Diagnosis Date  . Anxiety   . Bronchitis   . Carpal tunnel syndrome on both sides   . Depression   . Family history of adverse reaction to anesthesia    mother had problems waking up from surgery.  . Gestational diabetes mellitus    Normal 2 hr GTT postpartum  . Headache    migraines  . Hypertension   . Oligohydramnios antepartum 11/09/2017  . Panic attack 2017   diagnosed 3 months ago. no meds presently  . Reflux    did not filll prescription  . S/P percutaneous patent foramen ovale closure 06/16/2020   s/p PFO occluder device with a a 25 MM AMPLATZER by Dr. Excell Seltzer    Past Surgical History:  Procedure Laterality Date  . BREAST BIOPSY  right    cyst  . BUBBLE STUDY  04/16/2020   Procedure: BUBBLE STUDY;  Surgeon: Chilton Si, MD;  Location: [facility] ENDOSCOPY;  Service: Cardiovascular;;  . DILATION AND EVACUATION N/A 07/26/2015   Procedure: DILATATION AND EVACUATION;  Surgeon: Brock Bad, MD;  Location: [facility] ORS;  Service: Gynecology;  Laterality: N/A;  . PATENT FORAMEN OVALE(PFO) CLOSURE N/A 06/16/2020   Procedure: PATENT FORAMEN OVALE (PFO) CLOSURE;  Surgeon: Tonny Bollman, MD;  Location[facility] INVASIVE CV LAB;  Service: Cardiovascular;  Laterality: N/A;  . TEE WITHOUT CARDIOVERSION N/A 04/16/2020   Procedure:  TRANSESOPHAGEAL ECHOCARDIOGRAM (TEE);  Surgeon: Chilton Si, MD;  Location: [facility] ENDOSCOPY;  Service: Cardiovascular;  Laterality: N/A;    There were no vitals filed for this visit.   Subjective Assessment - 06/21/20 0821    Subjective "I feel like I am becoming more accepting of not being able to find my words."    Currently in Pain? No/denies    Pain Score 0-No pain                 ADULT SLP TREATMENT - 06/21/20 0834      General Information   Behavior/Cognition Alert;Cooperative;Pleasant mood      Cognitive-Linquistic Treatment   Treatment focused on Cognition    Skilled Treatment Patient reports increased acceptance of her word finding abilities at home, now that she feels her family and friends are better educated on her impairments. Pt reports she is going back to work this month. To determine work readiness, SLP had patient complete "WHEELS" Assessment . Pt demonstrated significant difficulty with the clock and required SLP to assist (receiving a 0 independently). With occasional verbal cues, patient was able to compete the clock drawing. Pt and SLP to complete next session.      Assessment / Recommendations / Plan   Plan Continue with current plan of care      Progression Toward Goals  Progression toward goals Progressing toward goals            SLP Education - 06/21/20 [phone]    Education Details Provided edu on the importance of continuing to use word finding strateies at home.    Methods Explanation;Demonstration    Comprehension Verbalized understanding            SLP Short Term Goals - 06/21/20 0825      SLP SHORT TERM GOAL #1   Title Pt will utilize word finding strategies (of a list) given a verbal prompt to increase word finding ability.    Time 3    Period Weeks    Status Achieved      SLP SHORT TERM GOAL #2   Title Pt will recall 2 memory strategies to assist with medication and financial management.    Time 3    Period Weeks    Status  On-going      SLP SHORT TERM GOAL #3   Title Patient will demo attention to detail in school/work-like material with double checking/self-corrections.    Time 3    Period Weeks    Status On-going      SLP SHORT TERM GOAL #4   Title Patient will complete planning/organization exercises with mod verbal and visual cues.    Time 3    Period Weeks    Status On-going            SLP Long Term Goals - 06/21/20 0826      SLP LONG TERM GOAL #1   Title Patient will participate in restorative and compensatory therapy to improve verbal expression by incorporating strategies to communicate wants and needs effectively to different conversational partners, maintain safety and participate socially in their functional living environment.    Time 9    Period Weeks    Status On-going      SLP LONG TERM GOAL #2   Title Patient will demonstrate use of memory strategies to schedule activities, recall weekly events and items to maintain safety to participate socially in functional living environment    Time 9    Period Weeks    Status On-going      SLP LONG TERM GOAL #3   Title Patient will increase information processing speed during planning and organization daily living activities to improve safety and awareness in functional living environment    Time 9    Period Weeks    Status New      SLP LONG TERM GOAL #4   Title Patient will develop functional attention skills to effectively attend to and communicate in tasks of daily living in their functional living environment.    Time 9    Period Weeks    Status On-going            Plan - 06/21/20 0824    Clinical Impression Statement Pt is a 45 yo female with hx significant for RCVA. 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 She would like to get back to work and continue pursuing her bachelors degree. Since the CVA, patient reports trouble with recalling words and difficulty holding  information in her head. Cognitive-communication was assessed this date using the SLUMS. Patient scored a 17/30 indicating a moderate impairment. Difficulty noted in working memory, short term memory, attention, and generative naming. Informal assessment of receptive and expressive language skills noted rare anomia, but more disorganized thought processes. Given extra time, patient was able  to achieve the correct word. SLP rec skilled speech services to address expressive aphasia and cognitive-communication impairment to achieve participation in all ADLs.    Speech Therapy Frequency 2x / week    Duration 12 weeks    Treatment/Interventions Compensatory strategies;Cueing hierarchy;Functional tasks;Patient/family education;Environmental controls;Cognitive reorganization;Multimodal communcation approach;Language facilitation;Compensatory techniques;Internal/external aids;SLP instruction and feedback    Potential to Achieve Goals Good    Consulted and Agree with Plan of Care Patient           Patient will benefit from skilled therapeutic intervention in order to improve the following deficits and impairments:   Cognitive communication deficit  Aphasia    Problem List Patient Active Problem List   Diagnosis Date Noted  . S/P percutaneous patent foramen ovale closure 06/16/2020  . Cryptogenic stroke (HCC) - R frontal lobe 05/31/2020  . PFO (patent foramen ovale) - ROPE score 5 05/31/2020  . Hypokalemia 04/03/2020  . Hypertension   . Depression   . Anxiety 11/27/2017  . Vitamin D deficiency 06/07/2017    Dorena Bodo MS, [name], CBIS  06/21/2020, 8:42 AM  [facility] [address] [place], [place], [address] Phone: [phone]   Fax:  [phone]   Name: Tracey [name] MRN: [id number] Date of Birth: 1975/10/26

## 2020-06-22 NOTE — Progress Notes (Signed)
IMPRESSION: No evidence of OSA, and no cardiac rhythm abnormality.   1. Delayed sleep onset - and associated frequent arousals from sleep that appeared spontaneous- unrelated to changes in the physiological channels. Arousals out of REM sleep were causes of prolonged periods of wakefulness.  2. Unfortunately, only 3.5 hours of sleep were recorded. However, all sleep stages were captured.  The patient entered sustained sleep at 1 AM and was woken at 5 AM.    RECOMMENDATIONS: no sleep clinic follow up needed.

## 2020-06-22 NOTE — Procedures (Signed)
PATIENT'S NAME:  Tracey [name], Tracey [name] DOB:      [birth year]      MR#:    [id number]     DATE OF RECORDING: 06/13/2020 Josefina Do REFERRING M.D.:  Delia Heady, MD  Study Performed:   Baseline Polysomnogram HISTORY:  Tracey [name] had a stroke in January -she has a medical history of Anxiety, Bronchitis, Carpal tunnel syndrome on both sides, Depression, Family history of adverse reaction to anesthesia, Gestational diabetes mellitus, Headache, Hypertension, Oligohydramnios antepartum (11/09/2017), Panic attack (2017), and Reflux.  She had a cryptogenic stroke, she is a smoker and she is obese. She snores. She has insomnia with anxiety. She has nocturnal palpitations. She wakes gasping for air. She was a sleep walker in childhood.   The patient endorsed the Epworth Sleepiness Scale at 14/24 points.   The patient's weight 215 pounds with a height of 66 (inches), resulting in a BMI of 34.7 kg/m2. The patient's neck circumference measured 16.5 inches.  CURRENT MEDICATIONS: ASA 81mg , Wellbutrin, Plavix, Cod Liver Oil, Zestril, Multivitamin, Probiotic, Crestor   PROCEDURE:  This is a multichannel digital polysomnogram utilizing the Somnostar 11.2 system.  Electrodes and sensors were applied and monitored per AASM Specifications.   EEG, EOG, Chin and Limb EMG, were sampled at 200 Hz.  ECG, Snore and Nasal Pressure, Thermal Airflow, Respiratory Effort, CPAP Flow and Pressure, Oximetry was sampled at 50 Hz. Digital video and audio were recorded.      BASELINE STUDY: Lights Out was at 22:58 and Lights On at 05:00.  Total recording time (TRT) was 362.5 minutes, with a total sleep time (TST) of 209.5 minutes.   The patient's sleep latency was 110.5 minutes.  REM latency was 104 minutes.  The sleep efficiency was 57.8 %.     SLEEP ARCHITECTURE: WASO (Wake after sleep onset) was 78.5 minutes.  There were 26 minutes in Stage N1, 125.5 minutes Stage N2, 18 minutes Stage N3 and 40 minutes in Stage REM.  The percentage of Stage N1  was 12.4%, Stage N2 was 59.9%, Stage N3 was 8.6% and Stage R (REM sleep) was 19.1%.   RESPIRATORY ANALYSIS:  There were a total of 3 respiratory events:  There were 3 hypopneas with a hypopnea index of 0.9 /hour.     The total APNEA/HYPOPNEA INDEX (AHI) was 0.9/hour.  3 events occurred in REM sleep and 0 events in NREM. The REM AHI was  4.5 /hour, versus a non-REM AHI of 0. The patient spent 88 minutes of total sleep time in the supine position and 122 minutes in non-supine. The supine AHI was 2.0 versus a non-supine AHI of 0.0.  OXYGEN SATURATION & C02:  The Wake baseline 02 saturation was 97%, with the lowest being 92%. Time spent below 89% saturation equaled 0 minutes.  The arousals were noted as: 26 were spontaneous, 7 were associated with PLMs, 3 were associated with respiratory events. The patient had Periodic Limb Movements only while sleeping supine.  The Periodic Limb Movement (PLM) Arousal index was 0.9 /hour.  Audio and video analysis did not show any abnormal or unusual movements, behaviors, phonations or vocalizations.    Mild Snoring was noted. EKG was in keeping with normal sinus rhythm (NSR), variable length of R to R intervals but no PVCs/PACs or heart block.    IMPRESSION: No evidence of OSA, and no cardiac rhythm abnormality.   1. Delayed sleep onset - and associated frequent arousals from sleep that appeared spontaneous- unrelated to changes in the physiological channels. Arousals  out of REM sleep were causes of prolonged periods of wakefulness.  2. Unfortunately, only 3.5 hours of sleep were recorded. However, all sleep stages were captured.  The patient entered sustained sleep at 1 AM and was woken at 5 AM.    RECOMMENDATIONS:   I certify that I have reviewed the entire raw data recording prior to the issuance of this report in accordance with the Standards of Accreditation of the American Academy of Sleep Medicine (AASM)    Melvyn Novas, MD Diplomat, American  Board of Psychiatry and Neurology  Diplomat, American Board of Sleep Medicine Wellsite geologist, [facility] [employer]

## 2020-06-23 ENCOUNTER — Ambulatory Visit (INDEPENDENT_AMBULATORY_CARE_PROVIDER_SITE_OTHER): Payer: 59 | Admitting: Cardiovascular Disease

## 2020-06-23 ENCOUNTER — Other Ambulatory Visit: Payer: Self-pay

## 2020-06-23 ENCOUNTER — Encounter: Payer: Self-pay | Admitting: Cardiovascular Disease

## 2020-06-23 ENCOUNTER — Telehealth: Payer: Self-pay | Admitting: Neurology

## 2020-06-23 VITALS — BP 120/80 | HR 69 | Ht 66.0 in | Wt 215.0 lb

## 2020-06-23 DIAGNOSIS — I10 Essential (primary) hypertension: Secondary | ICD-10-CM

## 2020-06-23 DIAGNOSIS — E7800 Pure hypercholesterolemia, unspecified: Secondary | ICD-10-CM | POA: Diagnosis not present

## 2020-06-23 DIAGNOSIS — Q2112 Patent foramen ovale: Secondary | ICD-10-CM

## 2020-06-23 DIAGNOSIS — Z8774 Personal history of (corrected) congenital malformations of heart and circulatory system: Secondary | ICD-10-CM

## 2020-06-23 DIAGNOSIS — Z6834 Body mass index (BMI) 34.0-34.9, adult: Secondary | ICD-10-CM | POA: Diagnosis not present

## 2020-06-23 DIAGNOSIS — I639 Cerebral infarction, unspecified: Secondary | ICD-10-CM | POA: Diagnosis not present

## 2020-06-23 DIAGNOSIS — Q211 Atrial septal defect: Secondary | ICD-10-CM

## 2020-06-23 NOTE — Telephone Encounter (Signed)
Called the patient and reviewed the sleep study in detail. Informed the patient that there was no evident of sleep apnea, low oxygen or irregular heart rhythm. Informed that there was delayed sleep onset but that this could be r/t to environment. Informed we were able to monitor her going into all stages of sleep. Advised from sleep clinic standpoint there is no need to follow up as there is no organic sleep disorder present. In regards to the patient's insomnia a recommendation that Dr Vickey Huger sometimes has is having the PCP refer to cognitive behavior therapy (psych) they are able to provide sleep tips and promote good sleep hygiene. Pt verbalized understanding. Pt had no questions at this time but was encouraged to call back if questions arise.

## 2020-06-23 NOTE — Patient Instructions (Addendum)
Medication Instructions:  Your physician recommends that you continue on your current medications as directed. Please refer to the Current Medication list given to you today.   *If you need a refill on your cardiac medications before your next appointment, please call your pharmacy*  Lab Work: FASTING LP/CMET SOON   If you have labs (blood work) drawn today and your tests are completely normal, you will receive your results only by: Marland Kitchen MyChart Message (if you have MyChart) OR . A paper copy in the mail If you have any lab test that is abnormal or we need to change your treatment, we will call you to review the results.  Testing/Procedures: NONE  Follow-Up: At [facility], you and your health needs are our priority.  As part of our continuing mission to provide you with exceptional heart care, we have created designated Provider Care Teams.  These Care Teams include your primary Cardiologist (physician) and Advanced Practice Providers (APPs -  Physician Assistants and Nurse Practitioners) who all work together to provide you with the care you need, when you need it.  We recommend signing up for the patient portal called "MyChart".  Sign up information is provided on this After Visit Summary.  MyChart is used to connect with patients for Virtual Visits (Telemedicine).  Patients are able to view lab/test results, encounter notes, upcoming appointments, etc.  Non-urgent messages can be sent to your provider as well.   To learn more about what you can do with MyChart, go to [email or web address].    Your next appointment:   6 month(s)  The format for your next appointment:   In Person  Provider:   DR [facility] OR NP/PA AT [place] LOCATION   You have been referred to [facility]   Where: [facility] Address: [address] [place] [address] Phone: [phone]  IF YOU DO NOT HEAR FROM THEM IN 2 WEEKS CALL THEM DIRECTLY AT THE ABOVE  NUMBER

## 2020-06-23 NOTE — Telephone Encounter (Signed)
-----   Message from Melvyn Novas, MD sent at 06/22/2020  5:52 PM EDT ----- IMPRESSION: No evidence of OSA, and no cardiac rhythm abnormality.   1. Delayed sleep onset - and associated frequent arousals from sleep that appeared spontaneous- unrelated to changes in the physiological channels. Arousals out of REM sleep were causes of prolonged periods of wakefulness.  2. Unfortunately, only 3.5 hours of sleep were recorded. However, all sleep stages were captured.  The patient entered sustained sleep at 1 AM and was woken at 5 AM.    RECOMMENDATIONS: no sleep clinic follow up needed.

## 2020-06-23 NOTE — Progress Notes (Signed)
Cardiology Office Note   Date:  06/23/2020   ID:  Tracey [name], DOB [birth year], MRN [id number]  PCP:  Etta Grandchild, MD  Cardiologist:   Chilton Si, MD   No chief complaint on file.     History of Present Illness: Tracey [name] is a 45 y.o. female with hypertension, stroke, PFO, and prior tobacco abuse who presents for follow-up.  She was diagnosed with hypertension after the birth of her youngest child in 2020.  She was started on lisinopril at that time.  She presented to the hospital 03/2020 with an acute stroke with neck discomfort and left-sided numbness and weakness.  She was found to have a small acute infarct in the right posterior frontal lobe.  It was thought to be embolic.  She followed up with Dr. Excell Seltzer and had a 25 mm Amplatzer occluder device implanted on 06/16/2020.  Postprocedural echo was stable with no residual shunting.  She continues to have some dull ache at her R groin.  She hasn't been getting much exercise but is walking some.  She wants to work on weight loss.  She is contemplating having another child and wants to know if she can do this safely.  She had gestational diabetes and it sounds like preeclampsia after delivery.  She has been on blood pressure medication ever since.  She has no exertional chest pain or shortness of breath.  She denies lower extremity edema, orthopnea, or PND.  When she checks her blood pressure at home it has been well-controlled.   Past Medical History:  Diagnosis Date  . Anxiety   . Bronchitis   . Carpal tunnel syndrome on both sides   . Depression   . Family history of adverse reaction to anesthesia    mother had problems waking up from surgery.  . Gestational diabetes mellitus    Normal 2 hr GTT postpartum  . Headache    migraines  . Hypertension   . Oligohydramnios antepartum 11/09/2017  . Panic attack 2017   diagnosed 3 months ago. no meds presently  . Reflux    did not filll prescription  . S/P percutaneous patent  foramen ovale closure 06/16/2020   s/p PFO occluder device with a a 25 MM AMPLATZER by Dr. Excell Seltzer    Past Surgical History:  Procedure Laterality Date  . BREAST BIOPSY  right    cyst  . BUBBLE STUDY  04/16/2020   Procedure: BUBBLE STUDY;  Surgeon: Chilton Si, MD;  Location: [facility] ENDOSCOPY;  Service: Cardiovascular;;  . DILATION AND EVACUATION N/A 07/26/2015   Procedure: DILATATION AND EVACUATION;  Surgeon: Brock Bad, MD;  Location: [facility] ORS;  Service: Gynecology;  Laterality: N/A;  . PATENT FORAMEN OVALE(PFO) CLOSURE N/A 06/16/2020   Procedure: PATENT FORAMEN OVALE (PFO) CLOSURE;  Surgeon: Tonny Bollman, MD;  Location: [facility] INVASIVE CV LAB;  Service: Cardiovascular;  Laterality: N/A;  . TEE WITHOUT CARDIOVERSION N/A 04/16/2020   Procedure: TRANSESOPHAGEAL ECHOCARDIOGRAM (TEE);  Surgeon: Chilton Si, MD;  Location: [facility] ENDOSCOPY;  Service: Cardiovascular;  Laterality: N/A;     Current Outpatient Medications  Medication Sig Dispense Refill  . aspirin 81 MG EC tablet Take 81 mg by mouth daily. Swallow whole.    Marland Kitchen buPROPion (WELLBUTRIN) 75 MG tablet Take 1 tablet (75 mg total) by mouth 2 (two) times daily. 180 tablet 1  . clopidogrel (PLAVIX) 75 MG tablet Take 1 tablet (75 mg total) by mouth daily. 90 tablet 1  . lisinopril (ZESTRIL) 20 MG tablet  Take 1.5 tablets (30 mg total) by mouth daily. 135 tablet 3  . Multiple Vitamins-Minerals (MULTIVITAMIN WITH MINERALS) tablet Take 1 tablet by mouth daily.    . Probiotic Product (PROBIOTIC DAILY PO) Take 1 tablet by mouth daily.     . rosuvastatin (CRESTOR) 10 MG tablet Take 1 tablet (10 mg total) by mouth daily. 90 tablet 1   No current facility-administered medications for this visit.    Allergies:   Patient has no known allergies.    Social History:  The patient  reports that she has been smoking cigarettes. She has been smoking about 0.25 packs per day. She has never used smokeless tobacco. She reports that she does not drink  alcohol and does not use drugs.   Family History:  The patient's family history includes Diabetes in her maternal aunt and maternal uncle; Hypertension in her mother.    ROS:  Please see the history of present illness.   Otherwise, review of systems are positive for none.   All other systems are reviewed and negative.    PHYSICAL EXAM: VS:  BP 120/80   Pulse 69   Ht 5\' 6"  (1.676 m)   Wt 215 lb (97.5 kg)   BMI 34.70 kg/m  , BMI Body mass index is 34.7 kg/m. GENERAL:  Well appearing HEENT:  Pupils equal round and reactive, fundi not visualized, oral mucosa unremarkable NECK:  No jugular venous distention, waveform within normal limits, carotid upstroke brisk and symmetric, no bruits, no thyromegaly LYMPHATICS:  No cervical adenopathy LUNGS:  Clear to auscultation bilaterally HEART:  RRR.  PMI not displaced or sustained,S1 and S2 within normal limits, no S3, no S4, no clicks, no rubs, no murmurs ABD:  Flat, positive bowel sounds normal in frequency in pitch, no bruits, no rebound, no guarding, no midline pulsatile mass, no hepatomegaly, no splenomegaly EXT:  2 plus pulses throughout, no edema, no cyanosis no clubbing SKIN:  No rashes no nodules NEURO:  Cranial nerves II through XII grossly intact, motor grossly intact throughout PSYCH:  Cognitively intact, oriented to person place and time   EKG:  EKG is ordered today. The ekg ordered today demonstrates sinus rhythm.  Rate 69 bpm.  Limited echo 06/16/2020 IMPRESSIONS  1. Left ventricular ejection fraction, by estimation, is 60 to 65%. The  left ventricle has normal function. The left ventricle has no regional  wall motion abnormalities.  2. Right ventricular systolic function is normal. The right ventricular  size is normal. There is normal pulmonary artery systolic pressure.  3. 25 mm Amplatzer occluder device in good position across septum No  obvious residual PFO Frame 15 had color flow at aortic edge of septum on  RA side  but I believe this is caval flow.  4. The mitral valve is normal in structure. No evidence of mitral valve  regurgitation. No evidence of mitral stenosis.  5. The aortic valve is normal in structure. Aortic valve regurgitation is  not visualized. No aortic stenosis is present.  6. The inferior vena cava is normal in size with greater than 50%  respiratory variability, suggesting right atrial pressure of 3 mmHg.   06/16/20 PATENT FORAMEN OVALE (PFO) CLOSURE  Conclusion SUCCESSFUL TRANSCATHETER PFO CLOSURE USING INTRACARDIAC ECHO AND FLUOROSCOPIC GUIDANCE. DEFECT CLOSED WITH A 25 MM AMPLATZER PFO OCCLUDER DEVICE  RECOMMEND:  ASA/CLOPIDOGREL X 6 MONTHS  SBE PROPHYLAXIS X 6 MONTHS  LIMITED 2D ECHO POST-PROCEDURE ASSESSMENT  Ambulatory monitor 05/2020:  Patient's monitoring period was from 04/16/20-05/15/20  Predominant rhythm was NSR with average HR 69bpm ranging from 45bpm to 127bpm.  There were rare SVEs (<1%), occasional PVCs (1%, 16977)  No afib, significant pauses or VT  Overall, no significant arrhythmias detected on the monitor   Recent Labs: 04/03/2020: ALT 19 04/09/2020: Magnesium 1.9; TSH 2.93 05/28/2020: BUN 10; Creatinine, Ser 1.04; Hemoglobin 14.2; Platelets 285; Potassium 4.4; Sodium 138    Lipid Panel    Component Value Date/Time   CHOL 149 04/03/2020 0343   TRIG 55 04/03/2020 0343   HDL 37 (L) 04/03/2020 0343   CHOLHDL 4.0 04/03/2020 0343   VLDL 11 04/03/2020 0343   LDLCALC 101 (H) 04/03/2020 0343      Wt Readings from Last 3 Encounters:  06/23/20 215 lb (97.5 kg)  06/17/20 208 lb 6.4 oz (94.5 kg)  06/15/20 213 lb (96.6 kg)      ASSESSMENT AND PLAN:  # Cryptogenic stroke:  # PFO s/p occlusion:  # Obesity: Etiology of her stroke was not identified.  She had no DVTs and arrhythmias were not detected on her ambulatory monitor.  She does have a PFO that was successfully closed by Dr. Excell Seltzer on 05/2020.  Plan for dual antiplatelet therapy first 6  months as well as SBE prophylaxis during that time.  She has questions about how long she will need to be on lipid-lowering therapy.  This was started during her hospitalization.  We did discuss the fact that given that she has a stroke, ideally her LDL should be less than 70.  We will focus on diet and exercise in order to achieve that goal.  We discussed options and she would like to have a referral to the healthy weight and wellness program.  She is also going to work on increasing her exercise to at least 150 minutes weekly.  She will come back for fasting lipids and a CMP.  #Hypertension: # Family planning: Blood pressure well have been controlled on lisinopril.  She understands that if she does decide to become pregnant that this will need to be switched.  She currently has a Mirena IUD in place and will let us know if she changes her mind.  We discussed waiting until after she completes her aspirin and Plavix before considering.    Current medicines are reviewed at length with the patient today.  The patient does not have concerns regarding medicines.  The following changes have been made:  no change  Labs/ tests ordered today include:   Orders Placed This Encounter  Procedures  . Lipid panel  . Comprehensive metabolic panel  . Ambulatory referral to [facility]  . EKG 12-Lead     Disposition:   FU with Dione Petron C. Duke Salvia, MD, [facility] in 6 months.     Signed, Shawntelle Ungar C. Duke Salvia, MD, [facility]  06/23/2020 12:33 PM    [facility]

## 2020-06-25 NOTE — Progress Notes (Signed)
1. Delayed sleep onset - and associated frequent arousals from  sleep that appeared spontaneous- unrelated to changes in the  physiological channels. Arousals out of REM sleep were causes of  prolonged periods of wakefulness.  2. Unfortunately, only 3.5 hours of sleep were recorded. However,  all sleep stages were captured.  The patient entered sustained sleep at 1 AM and was woken at 5  AM.

## 2020-06-29 ENCOUNTER — Other Ambulatory Visit: Payer: Self-pay

## 2020-06-29 ENCOUNTER — Ambulatory Visit: Payer: 59 | Admitting: Speech Pathology

## 2020-06-29 ENCOUNTER — Encounter: Payer: Self-pay | Admitting: Speech Pathology

## 2020-06-29 DIAGNOSIS — R41841 Cognitive communication deficit: Secondary | ICD-10-CM

## 2020-06-29 NOTE — Patient Instructions (Signed)
Continue to work on Diplomatic Services operational officer and memory strategies at home to assist with thought organization.

## 2020-06-29 NOTE — Therapy (Signed)
[facility] [address] [place], [place], [address] Phone: [phone]   Fax:  [phone]  Speech Language Pathology Treatment  Patient Details  Name: Tracey [name] MRN: [id number] Date of Birth: [birth year] Referring Provider (SLP): Etta Grandchild   Encounter Date: 06/29/2020   End of Session - 06/29/20 0812    Visit Number 9    Number of Visits 25    Date for SLP Re-Evaluation 08/02/19    SLP Start Time 0805   Pt late   SLP Stop Time  0843    SLP Time Calculation (min) 38 min    Activity Tolerance Patient tolerated treatment well;Patient limited by fatigue           Past Medical History:  Diagnosis Date  . Anxiety   . Bronchitis   . Carpal tunnel syndrome on both sides   . Depression   . Family history of adverse reaction to anesthesia    mother had problems waking up from surgery.  . Gestational diabetes mellitus    Normal 2 hr GTT postpartum  . Headache    migraines  . Hypertension   . Oligohydramnios antepartum 11/09/2017  . Panic attack 2017   diagnosed 3 months ago. no meds presently  . Reflux    did not filll prescription  . S/P percutaneous patent foramen ovale closure 06/16/2020   s/p PFO occluder device with a a 25 MM AMPLATZER by Dr. Excell Seltzer    Past Surgical History:  Procedure Laterality Date  . BREAST BIOPSY  right    cyst  . BUBBLE STUDY  04/16/2020   Procedure: BUBBLE STUDY;  Surgeon: Chilton Si, MD;  Location: [facility] ENDOSCOPY;  Service: Cardiovascular;;  . DILATION AND EVACUATION N/A 07/26/2015   Procedure: DILATATION AND EVACUATION;  Surgeon: Brock Bad, MD;  Location: [facility] ORS;  Service: Gynecology;  Laterality: N/A;  . PATENT FORAMEN OVALE(PFO) CLOSURE N/A 06/16/2020   Procedure: PATENT FORAMEN OVALE (PFO) CLOSURE;  Surgeon: Tonny Bollman, MD;  Location: [facility] INVASIVE CV LAB;  Service: Cardiovascular;  Laterality: N/A;  . TEE WITHOUT CARDIOVERSION N/A 04/16/2020   Procedure:  TRANSESOPHAGEAL ECHOCARDIOGRAM (TEE);  Surgeon: Chilton Si, MD;  Location: [facility] ENDOSCOPY;  Service: Cardiovascular;  Laterality: N/A;    There were no vitals filed for this visit.   Subjective Assessment - 06/29/20 0811    Subjective I am concerned about going back to work. My boss said he'll extend my time. "    Currently in Pain? No/denies                 ADULT SLP TREATMENT - 06/29/20 0817      General Information   Behavior/Cognition Alert;Cooperative;Pleasant mood      Treatment Provided   Treatment provided Cognitive-Linquistic      Cognitive-Linquistic Treatment   Treatment focused on Cognition    Skilled Treatment Pt reports that her boss extended her time to continue working on her impairments until she feels ready to come back to work. 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 We talked about the importance of "checking for mistakes" while doing something that requires a lot of critical thinking (simulating her position as a Tree surgeon). Pt had difficulty with some organization tasks indicating that she continues to have difficulty with some higher-level cognitive skills. She required minA with tasks; verbal cues to assist  with thought organization.      Assessment / Recommendations / Plan   Plan Continue with current plan of care      Progression Toward Goals   Progression toward goals Progressing toward goals              SLP Short Term Goals - 06/29/20 0813      SLP SHORT TERM GOAL #1   Title Pt will utilize word finding strategies (of a list) given a verbal prompt to increase word finding ability.    Period Weeks    Status Achieved      SLP SHORT TERM GOAL #2   Title Pt will recall 2 memory strategies to assist with medication and financial management.    Time 2    Period Weeks    Status On-going      SLP SHORT TERM GOAL #3   Title  Patient will demo attention to detail in school/work-like material with double checking/self-corrections.    Time 2    Period Weeks    Status On-going      SLP SHORT TERM GOAL #4   Title Patient will complete planning/organization exercises with mod verbal and visual cues.    Time 2    Period Weeks    Status On-going            SLP Long Term Goals - 06/29/20 0814      SLP LONG TERM GOAL #1   Title Patient will participate in restorative and compensatory therapy to improve verbal expression by incorporating strategies to communicate wants and needs effectively to different conversational partners, maintain safety and participate socially in their functional living environment.    Time 8    Period Weeks    Status On-going      SLP LONG TERM GOAL #2   Title Patient will demonstrate use of memory strategies to schedule activities, recall weekly events and items to maintain safety to participate socially in functional living environment    Time 8    Period Weeks    Status On-going      SLP LONG TERM GOAL #3   Title Patient will increase information processing speed during planning and organization daily living activities to improve safety and awareness in functional living environment    Time 8    Period Weeks    Status New      SLP LONG TERM GOAL #4   Title Patient will develop functional attention skills to effectively attend to and communicate in tasks of daily living in their functional living environment.    Time 8    Period Weeks    Status On-going            Plan - 06/29/20 0813    Clinical Impression Statement Pt is a 45 yo female with hx significant for RCVA. 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 She would like to get back to work and continue pursuing her bachelors degree. Since the CVA, patient reports trouble with recalling words and difficulty holding information in her head. Cognitive-communication was  assessed this date using the SLUMS. Patient scored a 17/30 indicating a moderate impairment. Difficulty noted in working memory, short term memory, attention, and generative naming. Informal assessment of receptive and expressive language skills noted rare anomia, but more disorganized thought processes. Given extra time, patient was able to achieve the correct word. SLP rec skilled speech services to address expressive aphasia and cognitive-communication impairment  to achieve participation in all ADLs.    Speech Therapy Frequency 2x / week    Duration 12 weeks    Treatment/Interventions Compensatory strategies;Cueing hierarchy;Functional tasks;Patient/family education;Environmental controls;Cognitive reorganization;Multimodal communcation approach;Language facilitation;Compensatory techniques;Internal/external aids;SLP instruction and feedback    Potential to Achieve Goals Good    Consulted and Agree with Plan of Care Patient           Patient will benefit from skilled therapeutic intervention in order to improve the following deficits and impairments:   Cognitive communication deficit    Problem List Patient Active Problem List   Diagnosis Date Noted  . S/P percutaneous patent foramen ovale closure 06/16/2020  . Cryptogenic stroke (HCC) - R frontal lobe 05/31/2020  . PFO (patent foramen ovale) - ROPE score 5 05/31/2020  . Hypokalemia 04/03/2020  . Hypertension   . Depression   . Anxiety 11/27/2017  . Vitamin D deficiency 06/07/2017    Dorena Bodo MS, [place], CBIS  06/29/2020, 8:41 AM  [facility] [address] [place], [place], [address] Phone: [phone]   Fax:  [phone]   Name: Tracey [name] MRN: [id number] Date of Birth: [birth year]

## 2020-07-04 ENCOUNTER — Other Ambulatory Visit: Payer: Self-pay

## 2020-07-04 ENCOUNTER — Emergency Department (HOSPITAL_BASED_OUTPATIENT_CLINIC_OR_DEPARTMENT_OTHER): Payer: 59

## 2020-07-04 ENCOUNTER — Emergency Department (HOSPITAL_BASED_OUTPATIENT_CLINIC_OR_DEPARTMENT_OTHER)
Admission: EM | Admit: 2020-07-04 | Discharge: 2020-07-04 | Disposition: A | Discharge status: Home / Self Care | Payer: 59 | Attending: Emergency Medicine | Admitting: Emergency Medicine

## 2020-07-04 ENCOUNTER — Encounter (HOSPITAL_BASED_OUTPATIENT_CLINIC_OR_DEPARTMENT_OTHER): Payer: Self-pay

## 2020-07-04 DIAGNOSIS — I10 Essential (primary) hypertension: Secondary | ICD-10-CM | POA: Diagnosis not present

## 2020-07-04 DIAGNOSIS — F17210 Nicotine dependence, cigarettes, uncomplicated: Secondary | ICD-10-CM | POA: Insufficient documentation

## 2020-07-04 DIAGNOSIS — R519 Headache, unspecified: Secondary | ICD-10-CM | POA: Diagnosis present

## 2020-07-04 HISTORY — DX: Cerebral infarction, unspecified: I63.9

## 2020-07-04 IMAGING — CT CT HEAD W/O CM
4 series · 16 of 47 positions shown, 18 images · non-contrast
Comparison: 05/10/2020

CLINICAL DATA: New or worsening headache over the last few days

EXAM:
CT HEAD WITHOUT CONTRAST
TECHNIQUE: Contiguous axial images were obtained from the base of the skull
through the vertex without intravenous contrast.

[Series 2: head wo · axial · 0.44mm/px · z∈[-474,-364]mm · 7 of 30 slices shown, 9 images]
[im 4/30  brain]
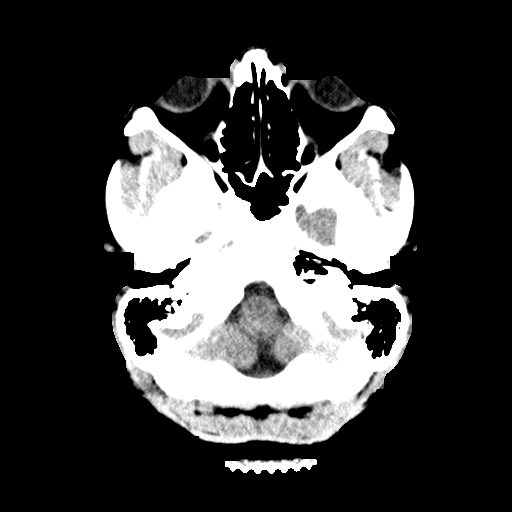
[im 4/30  bone]
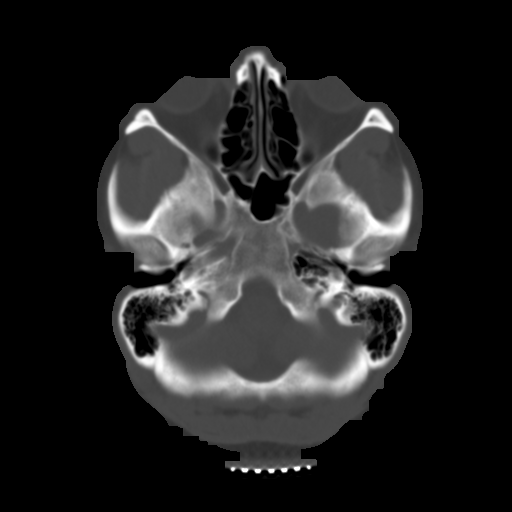
[im 8/30  brain]
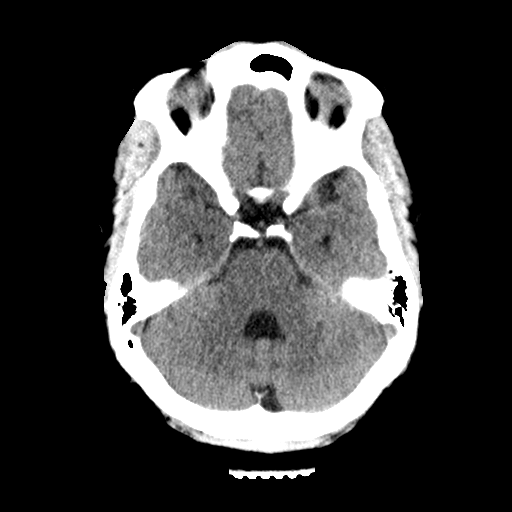
[im 11/30  brain]
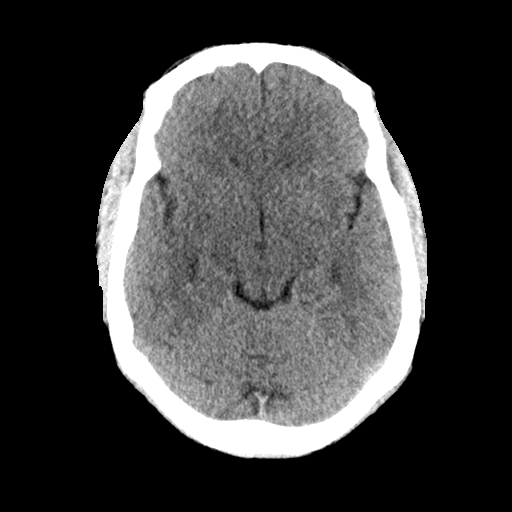
[im 15/30  brain]
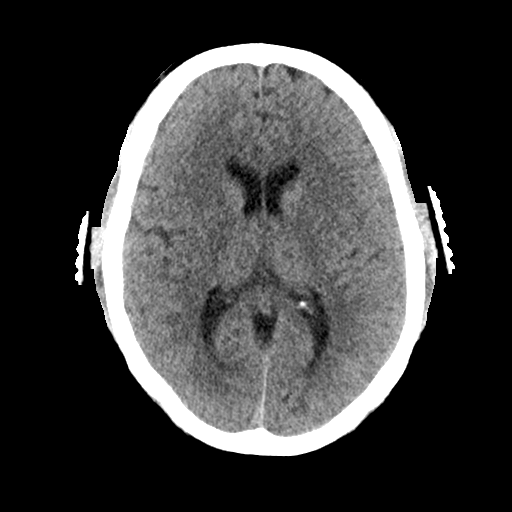
[im 19/30  brain]
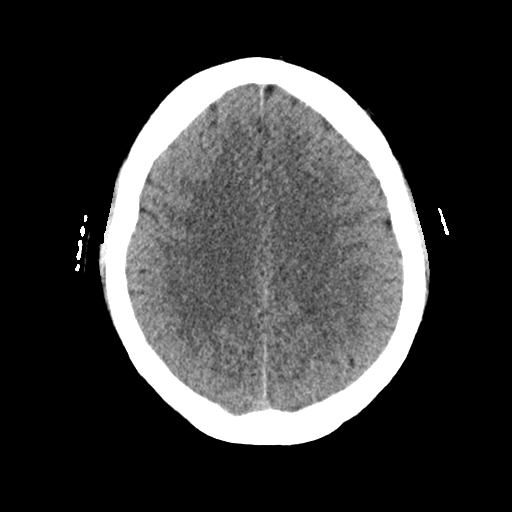
[im 19/30  bone]
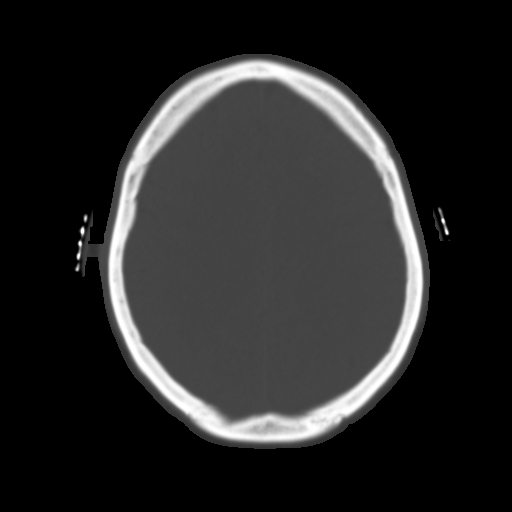
[im 22/30  brain]
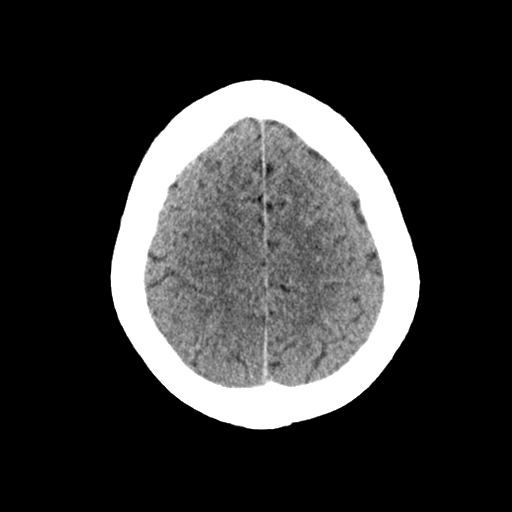
[im 26/30  brain]
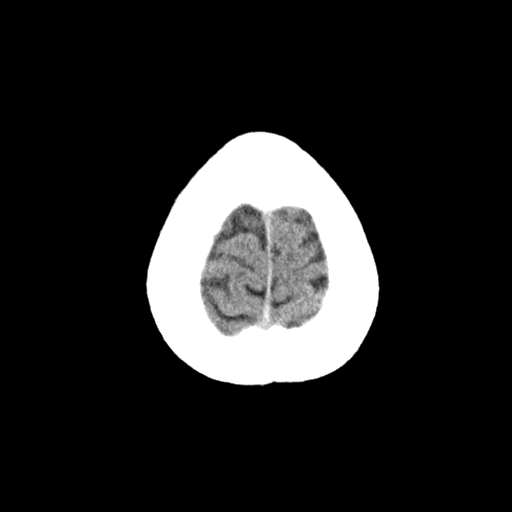

[Series 3: head bone · axial · 0.44mm/px · z∈[-475,-445]mm · 3 of 75 slices shown]
[im 8/75  bone]
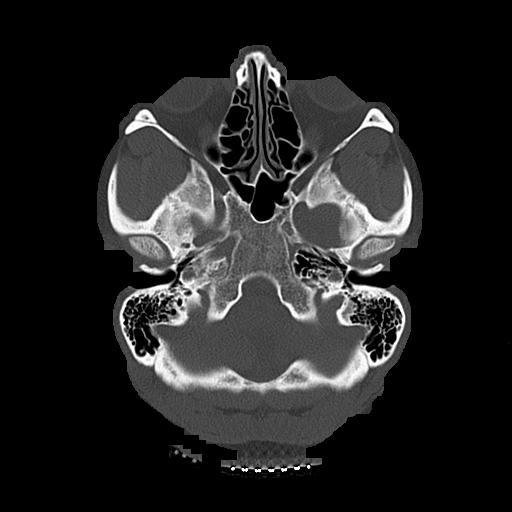
[im 15/75  bone]
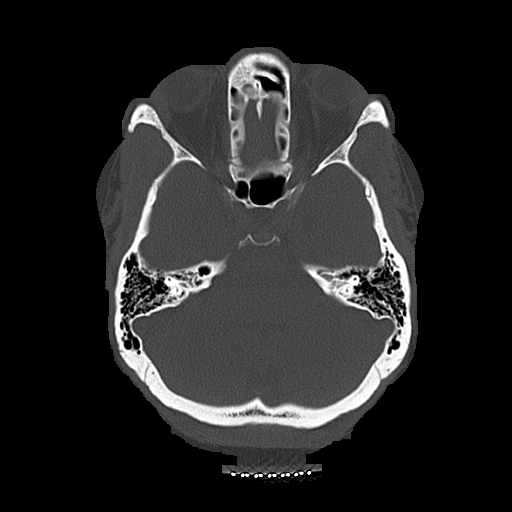
[im 23/75  bone]
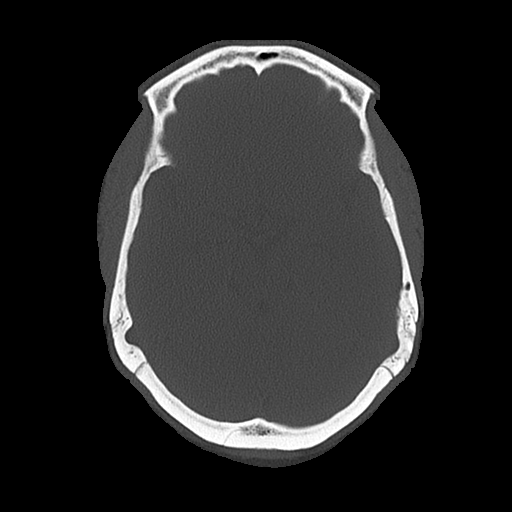

[Series 4: coronal soft · coronal · 0.30mm/px · 3 of 67 slices shown]
[im 23/67  brain]
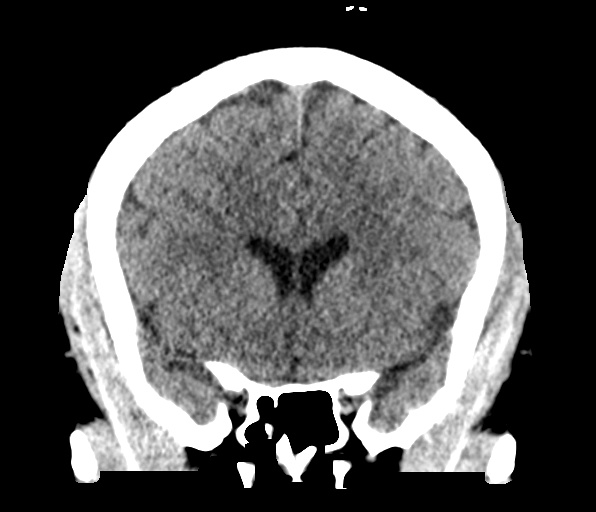
[im 30/67  brain]
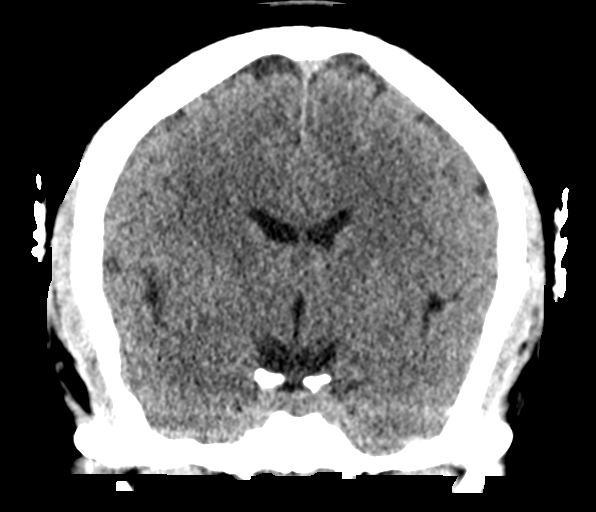
[im 37/67  brain]
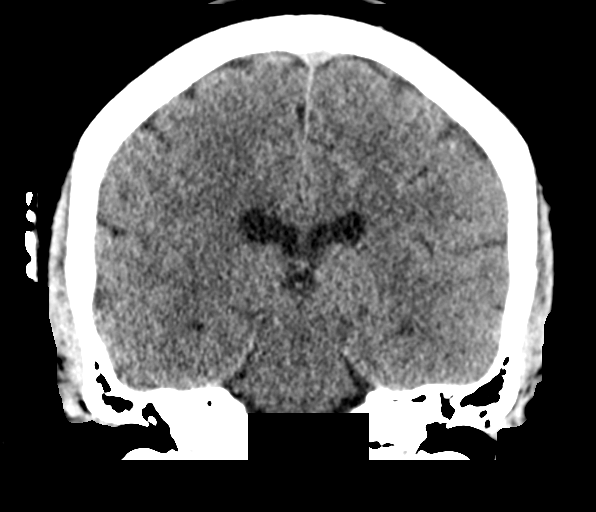

[Series 5: sagittal soft · sagittal · 0.32mm/px · 3 of 59 slices shown]
[im 20/59  brain]
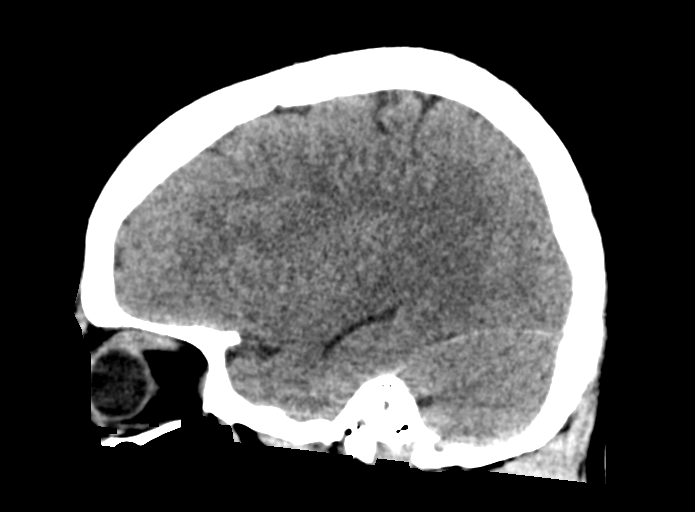
[im 30/59  brain]
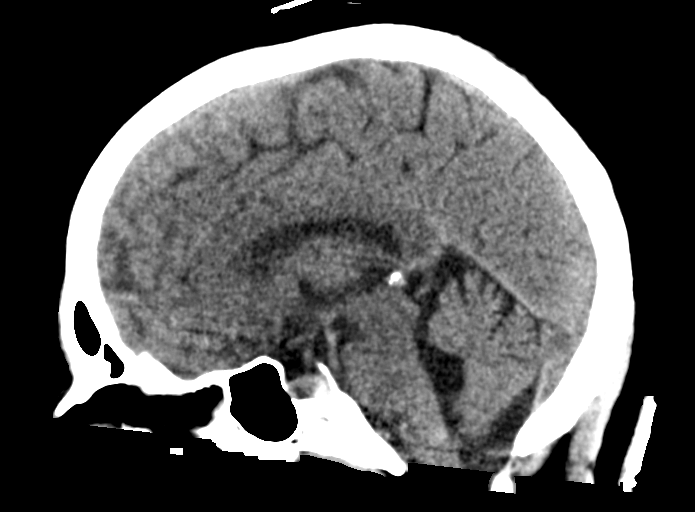
[im 39/59  brain]
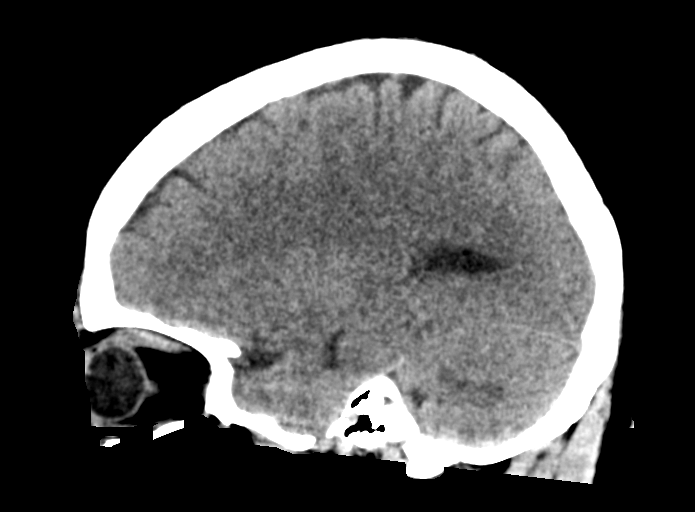

[16 of 47 positions shown; findings below may reference images not displayed]

FINDINGS: Brain: No evidence of acute infarction, hemorrhage, hydrocephalus,
extra-axial collection or mass lesion/mass effect.

Vascular: No hyperdense vessel or unexpected calcification.

Skull: Normal. Negative for fracture or focal lesion.

Sinuses/Orbits: No acute finding.
IMPRESSION: Negative head CT.

## 2020-07-04 MED ORDER — SODIUM CHLORIDE 0.9 % IV BOLUS
1000.0000 mL | Freq: Once | INTRAVENOUS | Status: AC
  Administered 2020-07-04: 1000 mL via INTRAVENOUS

## 2020-07-04 MED ORDER — PROCHLORPERAZINE EDISYLATE 10 MG/2ML IJ SOLN
10.0000 mg | Freq: Once | INTRAMUSCULAR | Status: AC
  Administered 2020-07-04: 10 mg via INTRAVENOUS
  Filled 2020-07-04: qty 2

## 2020-07-04 MED ORDER — DIPHENHYDRAMINE HCL 50 MG/ML IJ SOLN
25.0000 mg | Freq: Once | INTRAMUSCULAR | Status: AC
  Administered 2020-07-04: 25 mg via INTRAVENOUS
  Filled 2020-07-04: qty 1

## 2020-07-04 NOTE — ED Notes (Signed)
Pt up to restroom with steady gait.

## 2020-07-04 NOTE — Discharge Instructions (Addendum)
You were evaluated in the Emergency Department and after careful evaluation, we did not find any emergent condition requiring admission or further testing in the hospital.  Your exam/testing today was overall reassuring.  CT did not show any bleeding or emergencies.  Recommend drinking more water at home and following up with your neurologist.  Please return to the Emergency Department if you experience any worsening of your condition.  Thank you for allowing [place] to be a part of your care.

## 2020-07-04 NOTE — ED Notes (Signed)
Pt discharged home after verbalizing understanding of discharge instructions; nad noted. 

## 2020-07-04 NOTE — ED Provider Notes (Signed)
[facility] Emergency Department Provider Note MRN:  [id number]  Arrival date & time: 07/04/20     Chief Complaint   Headache   History of Present Illness   Tracey [name] is a 45 y.o. year-old female with a history of hypertension, stroke presenting to the ED with chief complaint of headache.  Location: Diffuse throughout the head Duration: A few hours Onset: Gradual Timing: Intermittent Description: Began yesterday afternoon, initially responded to Tylenol but then came back.  Woke up this morning with return of headache Severity: Moderate to severe Exacerbating/Alleviating Factors: None Associated Symptoms: None Pertinent Negatives: Denies fever, no nausea or vomiting, no numbness or weakness to the arms or legs, no chest pain, no abdominal pain.   Review of Systems  A complete 10 system review of systems was obtained and all systems are negative except as noted in the HPI and PMH.   Patient's Health History    Past Medical History:  Diagnosis Date  . Anxiety   . Bronchitis   . Carpal tunnel syndrome on both sides   . Depression   . Family history of adverse reaction to anesthesia    mother had problems waking up from surgery.  . Gestational diabetes mellitus    Normal 2 hr GTT postpartum  . Headache    migraines  . Hypertension   . Oligohydramnios antepartum 11/09/2017  . Panic attack 2017   diagnosed 3 months ago. no meds presently  . Reflux    did not filll prescription  . S/P percutaneous patent foramen ovale closure 06/16/2020   s/p PFO occluder device with a a 25 MM AMPLATZER by Dr. Excell Seltzer  . Stroke [facility])     Past Surgical History:  Procedure Laterality Date  . BREAST BIOPSY  right    cyst  . BUBBLE STUDY  04/16/2020   Procedure: BUBBLE STUDY;  Surgeon: Chilton Si, MD;  Location: [facility] ENDOSCOPY;  Service: Cardiovascular;;  . DILATION AND EVACUATION N/A 07/26/2015   Procedure: DILATATION AND EVACUATION;  Surgeon: Brock Bad, MD;  Location: [facility] ORS;  Service: Gynecology;  Laterality: N/A;  . PATENT FORAMEN OVALE(PFO) CLOSURE N/A 06/16/2020   Procedure: PATENT FORAMEN OVALE (PFO) CLOSURE;  Surgeon: Tonny Bollman, MD;  Location: [facility] INVASIVE CV LAB;  Service: Cardiovascular;  Laterality: N/A;  . TEE WITHOUT CARDIOVERSION N/A 04/16/2020   Procedure: TRANSESOPHAGEAL ECHOCARDIOGRAM (TEE);  Surgeon: Chilton Si, MD;  Location: [facility] ENDOSCOPY;  Service: Cardiovascular;  Laterality: N/A;    Family History  Problem Relation Age of Onset  . Hypertension Mother   . Diabetes Maternal Aunt   . Diabetes Maternal Uncle     Social History   Socioeconomic History  . Marital status: Married    Spouse name: Not on file  . Number of children: Not on file  . Years of education: Not on file  . Highest education level: Not on file  Occupational History  . Not on file  Tobacco Use  . Smoking status: Current Some Day Smoker    Packs/day: 0.25    Types: Cigarettes  . Smokeless tobacco: Never Used  . Tobacco comment: 2 cig/day  Vaping Use  . Vaping Use: Never used  Substance and Sexual Activity  . Alcohol use: No    Alcohol/week: 0.0 standard drinks  . Drug use: No  . Sexual activity: Not Currently  Other Topics Concern  . Not on file  Social History Narrative  . Not on file   Social Determinants of Health  Financial Resource Strain: Not on file  Food Insecurity: Not on file  Transportation Needs: Not on file  Physical Activity: Not on file  Stress: Not on file  Social Connections: Not on file  Intimate Partner Violence: Not on file     Physical Exam   Vitals:   07/04/20 0609  BP: 129/90  Pulse: 67  Resp: 17  Temp: 98.4 F (36.9 C)  SpO2: 100%    CONSTITUTIONAL: Well-appearing, NAD NEURO:  Alert and oriented x 3, normal and symmetric strength and sensation, normal coordination, normal speech EYES:  eyes equal and reactive ENT/NECK:  no LAD, no JVD CARDIO: Regular rate, well-perfused,  normal S1 and S2 PULM:  CTAB no wheezing or rhonchi GI/GU:  normal bowel sounds, non-distended, non-tender MSK/SPINE:  No gross deformities, no edema SKIN:  no rash, atraumatic PSYCH:  Appropriate speech and behavior  *Additional and/or pertinent findings included in MDM below  Diagnostic and Interventional Summary    EKG Interpretation  Date/Time:    Ventricular Rate:    PR Interval:    QRS Duration:   QT Interval:    QTC Calculation:   R Axis:     Text Interpretation:        Labs Reviewed - No data to display  CT HEAD WO CONTRAST  Final Result      Medications  sodium chloride 0.9 % bolus 1,000 mL (1,000 mLs Intravenous New Bag/Given 07/04/20 0643)  diphenhydrAMINE (BENADRYL) injection 25 mg (25 mg Intravenous Given 07/04/20 0628)  prochlorperazine (COMPAZINE) injection 10 mg (10 mg Intravenous Given 07/04/20 3151)     Procedures  /  Critical Care Procedures  ED Course and Medical Decision Making  I have reviewed the triage vital signs, the nursing notes, and pertinent available records from the EMR.  Listed above are laboratory and imaging tests that I personally ordered, reviewed, and interpreted and then considered in my medical decision making (see below for details).  Recent ischemic stroke, will CT head to exclude hemorrhagic conversion.  Overall patient is well-appearing with no neurological deficits, no meningismus, normal vital signs, anticipating discharge with neuro follow-up.     CT is normal, patient feeling better after migraine cocktail, appropriate for discharge.  Elmer Sow. Pilar Plate, MD [facility] Emergency Medicine [facility] [email or web address]  Final Clinical Impressions(s) / ED Diagnoses     ICD-10-CM   1. Acute nonintractable headache, unspecified headache type  R51.9     ED Discharge Orders    None       Discharge Instructions Discussed with and Provided to Patient:     Discharge Instructions     You were  evaluated in the Emergency Department and after careful evaluation, we did not find any emergent condition requiring admission or further testing in the hospital.  Your exam/testing today was overall reassuring.  CT did not show any bleeding or emergencies.  Recommend drinking more water at home and following up with your neurologist.  Please return to the Emergency Department if you experience any worsening of your condition.  Thank you for allowing [place] to be a part of your care.        Sabas Sous, MD 07/04/20 [phone]

## 2020-07-04 NOTE — ED Triage Notes (Addendum)
Pt is present to the ED for an ongoing generalized headache that has been worse over the last two days. Denies dizziness, N/V, or sensitivity to light. Pt reports of higher than normal BP readings at home.   Pt took tylenol all day yesterday and had relief of sx but headache would shortly return. Hx of stroke in 04/2020.

## 2020-07-05 ENCOUNTER — Ambulatory Visit: Payer: 59 | Admitting: Speech Pathology

## 2020-07-05 ENCOUNTER — Encounter: Payer: Self-pay | Admitting: Speech Pathology

## 2020-07-05 DIAGNOSIS — R4701 Aphasia: Secondary | ICD-10-CM

## 2020-07-05 DIAGNOSIS — R41841 Cognitive communication deficit: Secondary | ICD-10-CM | POA: Diagnosis not present

## 2020-07-05 NOTE — Therapy (Signed)
[facility] [place]. [place], [place], [address] Phone: [phone]   Fax:  [phone]  Speech Language Pathology Treatment  Patient Details  Name: Tracey [name] MRN: [id number] Date of Birth: Jan 04, 1976 Referring Provider (SLP): Janith Lima   Encounter Date: 07/05/2020   End of Session - 07/05/20 0855    Visit Number 10    Number of Visits 25    Date for SLP Re-Evaluation 08/02/19    SLP Start Time 0851    SLP Stop Time  0929    SLP Time Calculation (min) 38 min    Activity Tolerance Patient tolerated treatment well;Patient limited by fatigue           Past Medical History:  Diagnosis Date  . Anxiety   . Bronchitis   . Carpal tunnel syndrome on both sides   . Depression   . Family history of adverse reaction to anesthesia    mother had problems waking up from surgery.  . Gestational diabetes mellitus    Normal 2 hr GTT postpartum  . Headache    migraines  . Hypertension   . Oligohydramnios antepartum 11/09/2017  . Panic attack 2017   diagnosed 3 months ago. no meds presently  . Reflux    did not filll prescription  . S/P percutaneous patent foramen ovale closure 06/16/2020   s/p PFO occluder device with a a 25 MM AMPLATZER by Dr. Burt Knack  . Stroke [facility])     Past Surgical History:  Procedure Laterality Date  . BREAST BIOPSY  right    cyst  . BUBBLE STUDY  04/16/2020   Procedure: BUBBLE STUDY;  Surgeon: Skeet Latch, MD;  Location: [facility];  Service: Cardiovascular;;  . DILATION AND EVACUATION N/A 07/26/2015   Procedure: DILATATION AND EVACUATION;  Surgeon: Shelly Bombard, MD;  Location: [facility];  Service: Gynecology;  Laterality: N/A;  . PATENT FORAMEN OVALE(PFO) CLOSURE N/A 06/16/2020   Procedure: PATENT FORAMEN OVALE (PFO) CLOSURE;  Surgeon: Sherren Mocha, MD;  Location: [facility];  Service: Cardiovascular;  Laterality: N/A;  . TEE WITHOUT CARDIOVERSION N/A 04/16/2020    Procedure: TRANSESOPHAGEAL ECHOCARDIOGRAM (TEE);  Surgeon: Skeet Latch, MD;  Location: [facility];  Service: Cardiovascular;  Laterality: N/A;    There were no vitals filed for this visit.   Subjective Assessment - 07/05/20 0854    Subjective "My blood pressure was high and I had a headache yesterday, so I went to the emergency room."    Currently in Pain? No/denies                 ADULT SLP TREATMENT - 07/05/20 0928      General Information   Behavior/Cognition Alert;Cooperative;Pleasant mood      Treatment Provided   Treatment provided Cognitive-Linquistic      Cognitive-Linquistic Treatment   Treatment focused on Cognition    Skilled Treatment Today, SLP addressed organization, planning, and checking for errors. Patient navigated a website [email or web address]) to provide written directions of how to get from point A to point B. Initially, patient required minA to complete task. When educated to check for errors and to be more detail oriented, patient was able to complete task independently with 1 error.      Assessment / Recommendations / Plan   Plan Continue with current plan of care      Progression Toward Goals   Progression toward goals Progressing toward goals  SLP Short Term Goals - 07/05/20 0857      SLP SHORT TERM GOAL #1   Title Pt will utilize word finding strategies (of a list) given a verbal prompt to increase word finding ability.    Period Weeks    Status Achieved      SLP SHORT TERM GOAL #2   Title Pt will recall 2 memory strategies to assist with medication and financial management.    Time 2    Period Weeks    Status On-going      SLP SHORT TERM GOAL #3   Title Patient will demo attention to detail in school/work-like material with double checking/self-corrections.    Time 2    Period Weeks    Status On-going      SLP SHORT TERM GOAL #4   Title Patient will complete planning/organization exercises with mod verbal  and visual cues.    Time 2    Period Weeks    Status On-going            SLP Long Term Goals - 07/05/20 0857      SLP LONG TERM GOAL #1   Title Patient will participate in restorative and compensatory therapy to improve verbal expression by incorporating strategies to communicate wants and needs effectively to different conversational partners, maintain safety and participate socially in their functional living environment.    Time 8    Period Weeks    Status On-going      SLP LONG TERM GOAL #2   Title Patient will demonstrate use of memory strategies to schedule activities, recall weekly events and items to maintain safety to participate socially in functional living environment    Time 8    Period Weeks    Status On-going      SLP LONG TERM GOAL #3   Title Patient will increase information processing speed during planning and organization daily living activities to improve safety and awareness in functional living environment    Time 8    Period Weeks    Status New      SLP LONG TERM GOAL #4   Title Patient will develop functional attention skills to effectively attend to and communicate in tasks of daily living in their functional living environment.    Time 8    Period Weeks    Status On-going            Plan - 07/05/20 0856    Clinical Impression Statement Pt is a 45 yo female with hx significant for RCVA. 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 She would like to get back to work and continue pursuing her bachelors degree. Since the CVA, patient reports trouble with recalling words and difficulty holding information in her head. Cognitive-communication was assessed this date using the SLUMS. Patient scored a 17/30 indicating a moderate impairment. Difficulty noted in working memory, short term memory, attention, and generative naming. Informal assessment of receptive and expressive language skills noted rare anomia, but  more disorganized thought processes. Given extra time, patient was able to achieve the correct word. SLP rec skilled speech services to address expressive aphasia and cognitive-communication impairment to achieve participation in all ADLs.    Speech Therapy Frequency 2x / week    Duration 12 weeks    Treatment/Interventions Compensatory strategies;Cueing hierarchy;Functional tasks;Patient/family education;Environmental controls;Cognitive reorganization;Multimodal communcation approach;Language facilitation;Compensatory techniques;Internal/external aids;SLP instruction and feedback    Potential to Achieve Goals Good  Consulted and Agree with Plan of Care Patient           Patient will benefit from skilled therapeutic intervention in order to improve the following deficits and impairments:   Aphasia  Cognitive communication deficit    Problem List Patient Active Problem List   Diagnosis Date Noted  . S/P percutaneous patent foramen ovale closure 06/16/2020  . Cryptogenic stroke ([place]) - R frontal lobe 05/31/2020  . PFO (patent foramen ovale) - ROPE score 5 05/31/2020  . Hypokalemia 04/03/2020  . Hypertension   . Depression   . Anxiety 11/27/2017  . Vitamin D deficiency 06/07/2017   Speech Therapy Progress Note  Dates of Reporting Period: 05/04/20 to 08/01/20  Objective Reports of Subjective Statement: Pt has made met goals for word-finding. She continues to demonstrate difficulty with thought organization, planning, and attention.   Objective Measurements: See tx notes.   Goal Update: Has made improvement towards all goals.   Plan: Continue as planned.  Reason Skilled Services are Required: SLP rec skilled speech services to address cognitive impairment that impacts her ability to perform duties at home and work effectively and efficiently.   Rosann Auerbach [place] MS, [place], CBIS  07/05/2020, 1:22 PM  [place]. [place], [place], [address] Phone: [phone]   Fax:  [phone]   Name: Tracey [name] MRN: [id number] Date of Birth: [birth year]

## 2020-07-06 ENCOUNTER — Ambulatory Visit
Admission: RE | Admit: 2020-07-06 | Discharge: 2020-07-06 | Disposition: A | Discharge status: Home / Self Care | Payer: 59 | Source: Ambulatory Visit

## 2020-07-06 ENCOUNTER — Encounter (INDEPENDENT_AMBULATORY_CARE_PROVIDER_SITE_OTHER): Payer: Self-pay

## 2020-07-06 ENCOUNTER — Other Ambulatory Visit: Payer: Self-pay

## 2020-07-06 DIAGNOSIS — Z1231 Encounter for screening mammogram for malignant neoplasm of breast: Secondary | ICD-10-CM

## 2020-07-06 LAB — HM MAMMOGRAPHY

## 2020-07-06 IMAGING — MG MM DIGITAL SCREENING BILAT W/ TOMO AND CAD
6 of 12 series · 6 of 36 positions shown · non-contrast
Comparison: None.

CLINICAL DATA: Screening.

EXAM:
DIGITAL SCREENING BILATERAL MAMMOGRAM WITH TOMOSYNTHESIS AND CAD
TECHNIQUE: Bilateral screening digital craniocaudal and mediolateral oblique
mammograms were obtained. Bilateral screening digital breast
tomosynthesis was performed. The images were evaluated with
computer-aided detection.

[L CC synth-2D (1 of 2)]
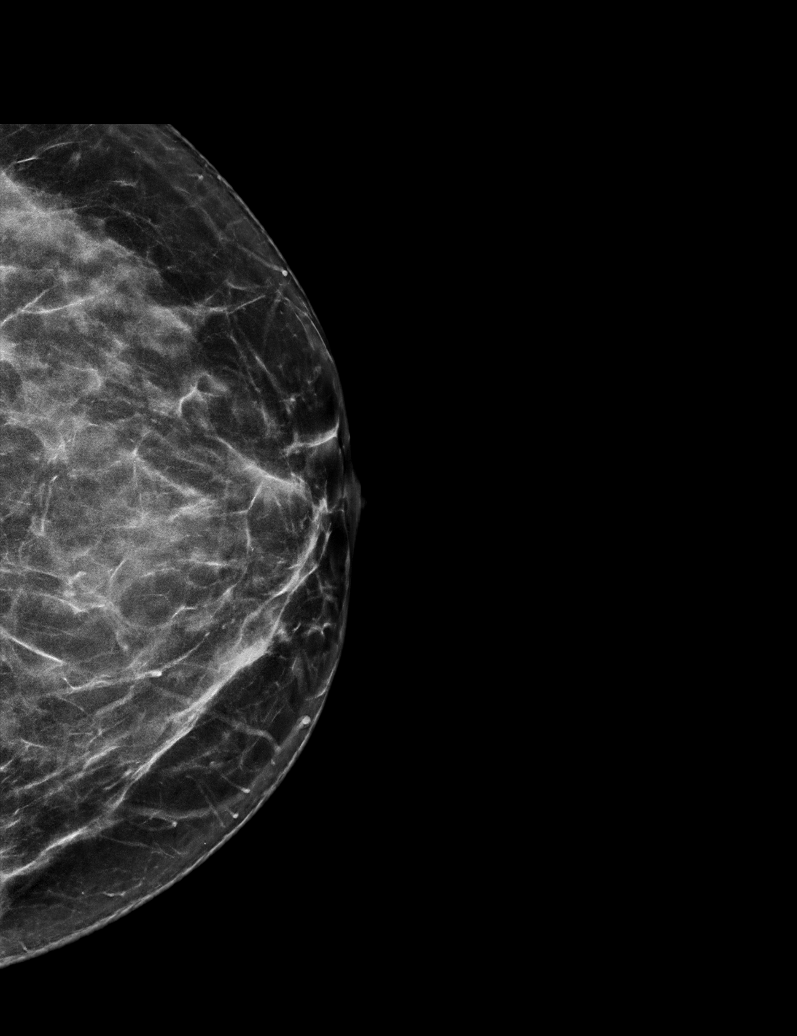

[R CC synth-2D (1 of 2)]
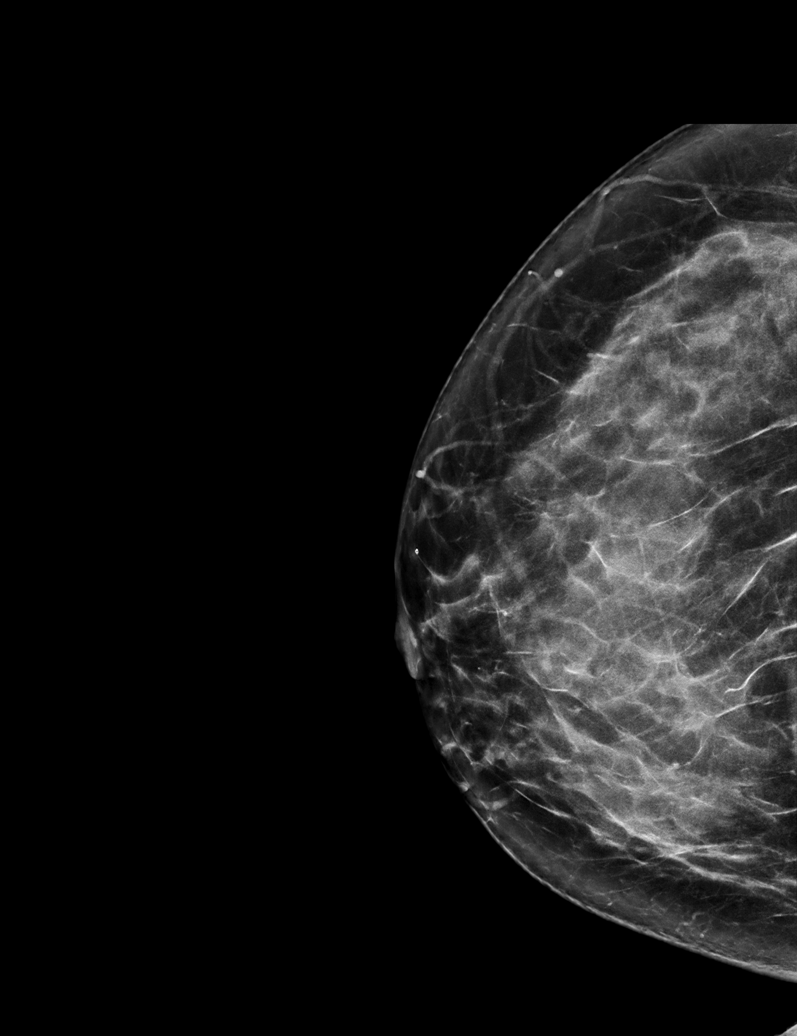

[L CC synth-2D (2 of 2)]
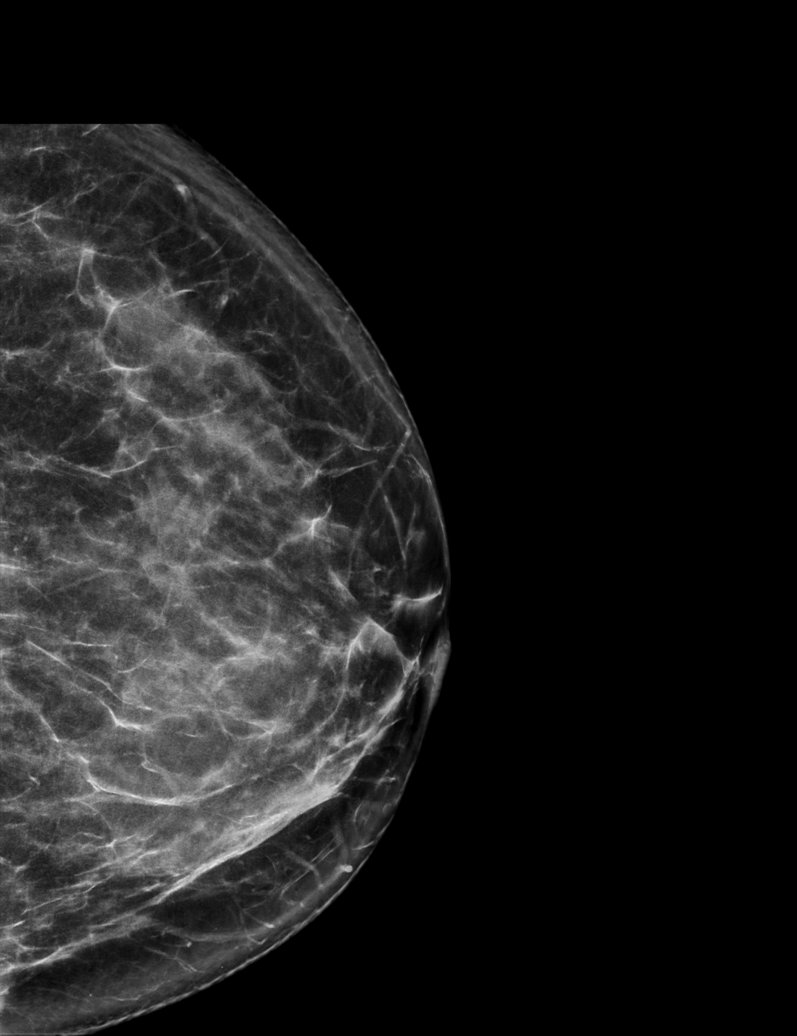

[R MLO synth-2D]
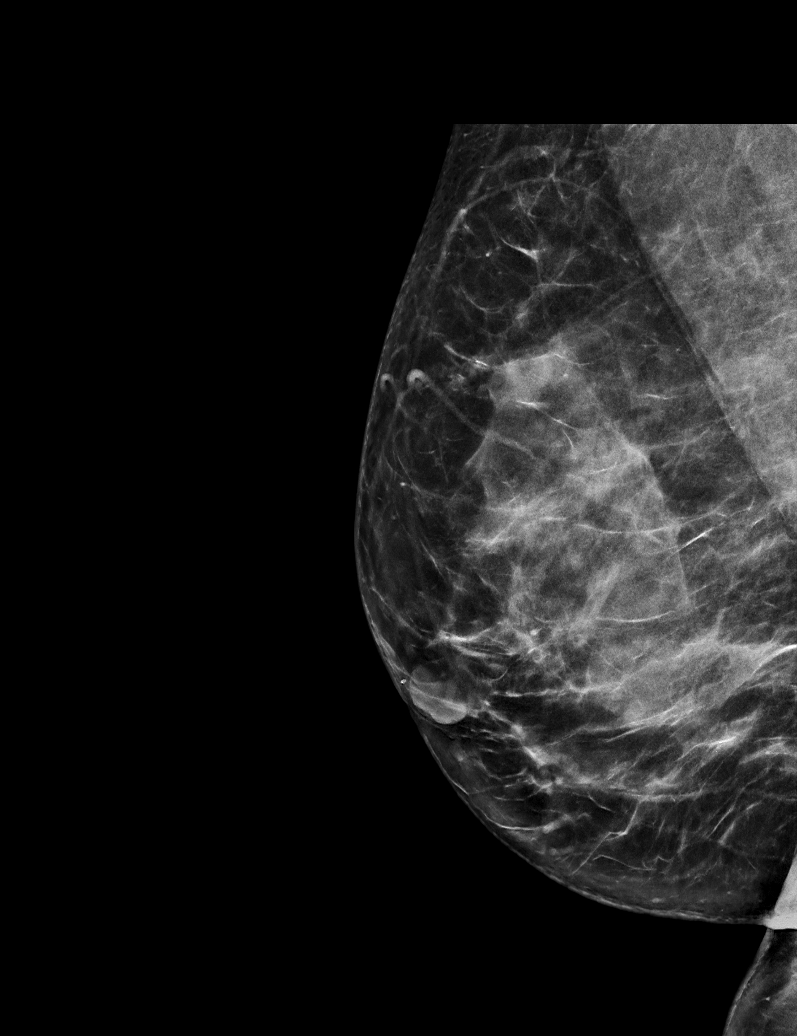

[L MLO synth-2D]
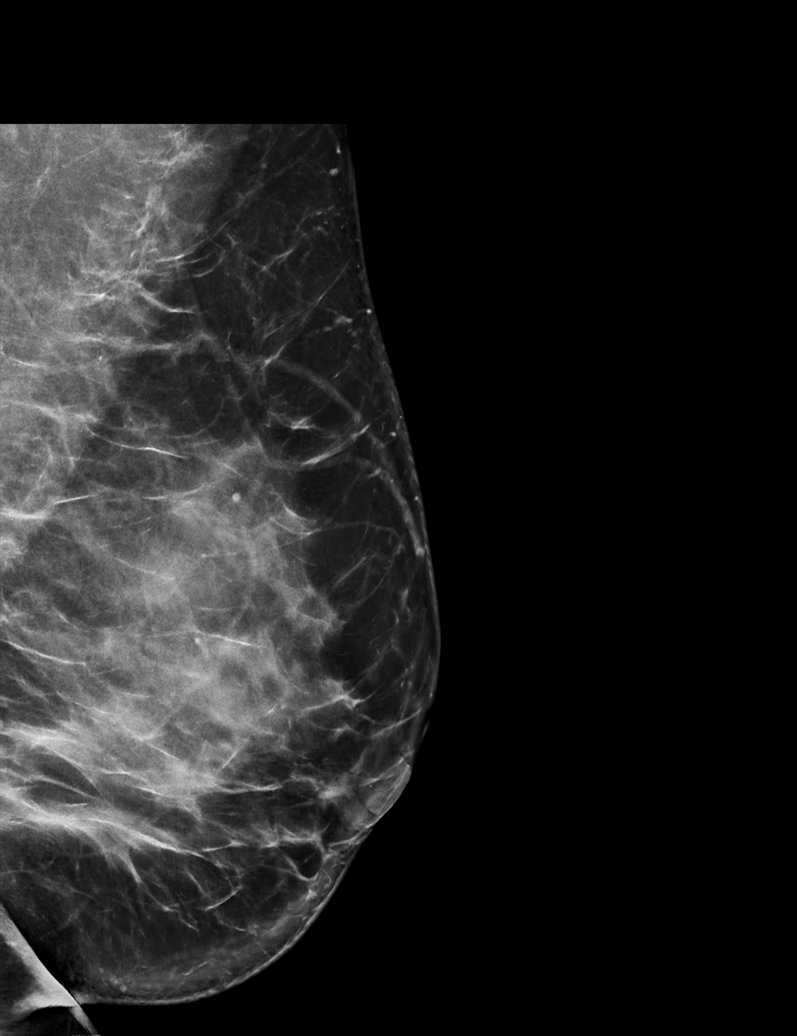

[R CC synth-2D (2 of 2)]
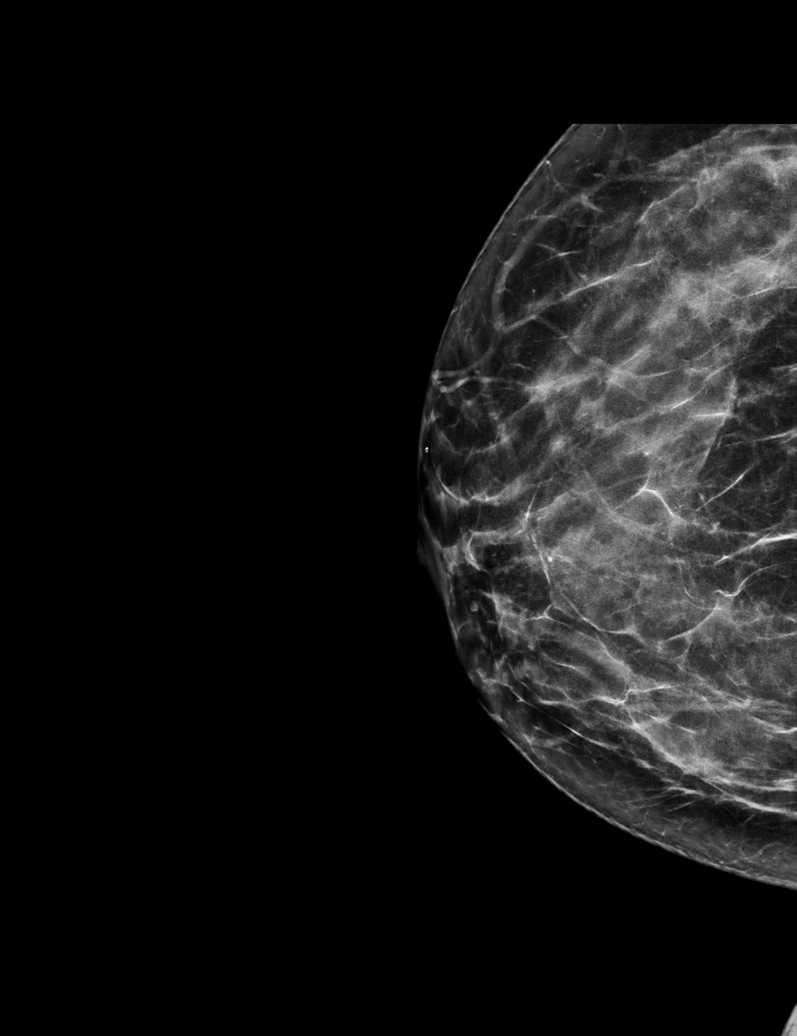

[6 of 36 positions shown; findings below may reference images not displayed]

ACR Breast Density Category c: The breast tissue is heterogeneously
dense, which may obscure small masses
FINDINGS: There are no findings suspicious for malignancy. The images were
evaluated with computer-aided detection.
IMPRESSION: No mammographic evidence of malignancy. A result letter of this
screening mammogram will be mailed directly to the patient.

RECOMMENDATION:
Screening mammogram in one year. (Code:CQ-1-UDH)

BI-RADS CATEGORY  1: Negative.

## 2020-07-07 ENCOUNTER — Telehealth: Payer: Self-pay | Admitting: *Deleted

## 2020-07-07 NOTE — Telephone Encounter (Signed)
Disability form to desk for review, partial completion, to complete and sign.

## 2020-07-08 ENCOUNTER — Telehealth: Payer: Self-pay | Admitting: Adult Health

## 2020-07-08 ENCOUNTER — Ambulatory Visit (INDEPENDENT_AMBULATORY_CARE_PROVIDER_SITE_OTHER): Payer: 59 | Admitting: Internal Medicine

## 2020-07-08 ENCOUNTER — Other Ambulatory Visit: Payer: Self-pay

## 2020-07-08 ENCOUNTER — Telehealth: Payer: Self-pay | Admitting: Internal Medicine

## 2020-07-08 ENCOUNTER — Encounter: Payer: Self-pay | Admitting: Internal Medicine

## 2020-07-08 VITALS — BP 128/86 | HR 80 | Temp 97.8°F | Ht 66.0 in | Wt 218.0 lb

## 2020-07-08 DIAGNOSIS — G43709 Chronic migraine without aura, not intractable, without status migrainosus: Secondary | ICD-10-CM | POA: Insufficient documentation

## 2020-07-08 DIAGNOSIS — F5104 Psychophysiologic insomnia: Secondary | ICD-10-CM | POA: Diagnosis not present

## 2020-07-08 DIAGNOSIS — Z0289 Encounter for other administrative examinations: Secondary | ICD-10-CM

## 2020-07-08 DIAGNOSIS — Z30431 Encounter for routine checking of intrauterine contraceptive device: Secondary | ICD-10-CM

## 2020-07-08 MED ORDER — ESZOPICLONE 2 MG PO TABS: 2.0000 mg | ORAL_TABLET | Freq: Every evening | ORAL | 1 refills | Status: DC | PRN

## 2020-07-08 MED ORDER — UBRELVY 100 MG PO TABS
1.0000 | ORAL_TABLET | Freq: Every day | ORAL | 1 refills | Status: DC | PRN
Start: 2020-07-08 — End: 2021-01-03

## 2020-07-08 NOTE — Telephone Encounter (Signed)
FMLA [employer] form given to [employer] 4.21.2022

## 2020-07-08 NOTE — Telephone Encounter (Signed)
See previous note

## 2020-07-08 NOTE — Patient Instructions (Signed)

## 2020-07-08 NOTE — Telephone Encounter (Signed)
Completed and placed in out box.  Thank you. 

## 2020-07-08 NOTE — Telephone Encounter (Signed)
Patient requesting eszopiclone (LUNESTA) 2 MG TABS tablet be sent to [facility] instead of [facility]  Pharmacy [id number] - [place], [place] - [address] AT [facility] [address] [facility] [address]

## 2020-07-08 NOTE — Progress Notes (Signed)
Subjective:  Patient ID: Tracey [name], female    DOB: 1975-05-17  Age: 45 y.o. MRN: [id number]  CC: Hypertension and Headache  This visit occurred during the SARS-CoV-2 public health emergency.  Safety protocols were in place, including screening questions prior to the visit, additional usage of staff PPE, and extensive cleaning of exam room while observing appropriate contact time as indicated for disinfecting solutions.    HPI Tracey [name] presents for f/up -   She complains of intermittent headaches for many years.  She describes a pounding sensation throughout her entire head that happens about once every week or 2.  She has had some ER visits for this.  She has photophobia but not phonophobia.  She denies nausea, vomiting, or paresthesias.  She says the headaches have not worsened or changed recently.  She complains of insomnia with difficulty falling asleep.  Outpatient Medications Prior to Visit  Medication Sig Dispense Refill  . aspirin 81 MG EC tablet Take 81 mg by mouth daily. Swallow whole.    Marland Kitchen buPROPion (WELLBUTRIN) 75 MG tablet Take 1 tablet (75 mg total) by mouth 2 (two) times daily. 180 tablet 1  . clopidogrel (PLAVIX) 75 MG tablet Take 1 tablet (75 mg total) by mouth daily. 90 tablet 1  . levonorgestrel (MIRENA, 52 MG,) 20 MCG/24HR IUD 1 Intra Uterine Device (1 each total) by Intrauterine route once for 1 dose. 1 each 0  . lisinopril (ZESTRIL) 20 MG tablet Take 1.5 tablets (30 mg total) by mouth daily. 135 tablet 3  . Multiple Vitamins-Minerals (MULTIVITAMIN WITH MINERALS) tablet Take 1 tablet by mouth daily.    . Probiotic Product (PROBIOTIC DAILY PO) Take 1 tablet by mouth daily.     . rosuvastatin (CRESTOR) 10 MG tablet Take 1 tablet (10 mg total) by mouth daily. 90 tablet 1   No facility-administered medications prior to visit.    ROS Review of Systems  Constitutional: Negative.  Negative for unexpected weight change.  HENT: Negative.  Negative for trouble  swallowing.   Eyes: Positive for photophobia. Negative for pain and visual disturbance.  Respiratory: Positive for apnea. Negative for cough and shortness of breath.   Cardiovascular: Negative for chest pain, palpitations and leg swelling.  Gastrointestinal: Negative for abdominal pain and diarrhea.  Endocrine: Negative.   Genitourinary: Negative.  Negative for difficulty urinating and dysuria.  Musculoskeletal: Negative for arthralgias, back pain, myalgias and neck pain.  Skin: Negative.   Neurological: Positive for headaches. Negative for dizziness, weakness and light-headedness.  Hematological: Negative for adenopathy. Does not bruise/bleed easily.    Objective:  BP 128/86   Pulse 80   Temp 97.8 F (36.6 C) (Oral)   Ht 5\' 6"  (1.676 m)   Wt 218 lb (98.9 kg)   SpO2 98%   BMI 35.19 kg/m   BP Readings from Last 3 Encounters:  07/08/20 128/86  07/04/20 134/62  06/23/20 120/80    Wt Readings from Last 3 Encounters:  07/08/20 218 lb (98.9 kg)  07/04/20 215 lb 6.2 oz (97.7 kg)  06/23/20 215 lb (97.5 kg)    Physical Exam Vitals reviewed.  Constitutional:      Appearance: She is well-developed. She is obese. She is not ill-appearing.  HENT:     Mouth/Throat:     Mouth: Mucous membranes are moist.  Eyes:     General: No scleral icterus.    Conjunctiva/sclera: Conjunctivae normal.     Pupils: Pupils are equal, round, and reactive to light.  Cardiovascular:  Rate and Rhythm: Normal rate and regular rhythm.     Heart sounds: No murmur heard.   Pulmonary:     Effort: Pulmonary effort is normal.     Breath sounds: No stridor. No wheezing, rhonchi or rales.  Abdominal:     General: Abdomen is protuberant. Bowel sounds are normal. There is no distension.     Palpations: Abdomen is soft. There is no hepatomegaly, splenomegaly or mass.     Tenderness: There is no abdominal tenderness.  Musculoskeletal:        General: Normal range of motion.     Cervical back: Neck  supple.     Right lower leg: No edema.     Left lower leg: No edema.  Lymphadenopathy:     Cervical: No cervical adenopathy.  Skin:    General: Skin is warm and dry.     Coloration: Skin is not pale.  Neurological:     General: No focal deficit present.     Mental Status: She is alert and oriented to person, place, and time. Mental status is at baseline.  Psychiatric:        Mood and Affect: Mood normal.        Behavior: Behavior normal.        Thought Content: Thought content normal.        Judgment: Judgment normal.     Lab Results  Component Value Date   WBC 7.3 05/28/2020   HGB 14.2 05/28/2020   HCT 42.5 05/28/2020   PLT 285 05/28/2020   GLUCOSE 100 (H) 05/28/2020   CHOL 149 04/03/2020   TRIG 55 04/03/2020   HDL 37 (L) 04/03/2020   LDLCALC 101 (H) 04/03/2020   ALT 19 04/03/2020   AST 13 (L) 04/03/2020   NA 138 05/28/2020   K 4.4 05/28/2020   CL 104 05/28/2020   CREATININE 1.04 (H) 05/28/2020   BUN 10 05/28/2020   CO2 19 (L) 05/28/2020   TSH 2.93 04/09/2020   INR 0.9 04/02/2020   HGBA1C 5.1 04/03/2020    MM 3D SCREEN BREAST BILATERAL  Result Date: 07/06/2020 CLINICAL DATA:  Screening. EXAM: DIGITAL SCREENING BILATERAL MAMMOGRAM WITH TOMOSYNTHESIS AND CAD TECHNIQUE: Bilateral screening digital craniocaudal and mediolateral oblique mammograms were obtained. Bilateral screening digital breast tomosynthesis was performed. The images were evaluated with computer-aided detection. COMPARISON:  None. ACR Breast Density Category c: The breast tissue is heterogeneously dense, which may obscure small masses FINDINGS: There are no findings suspicious for malignancy. The images were evaluated with computer-aided detection. IMPRESSION: No mammographic evidence of malignancy. A result letter of this screening mammogram will be mailed directly to the patient. RECOMMENDATION: Screening mammogram in one year. (Code:[id number]-01Y) BI-RADS CATEGORY  1: Negative. Electronically Signed   By:  Gerome Sam III M.D   On: 07/06/2020 14:49    Assessment & Plan:   [name] was seen today for hypertension and headache.  Diagnoses and all orders for this visit:  Psychophysiological insomnia- She recently underwent a sleep study but did not sleep enough to get a good quality study.  I recommended that she treat this with Lunesta. -     Discontinue: eszopiclone (LUNESTA) 2 MG TABS tablet; Take 1 tablet (2 mg total) by mouth at bedtime as needed for sleep. Take immediately before bedtime -     Discontinue: eszopiclone (LUNESTA) 2 MG TABS tablet; Take 1 tablet (2 mg total) by mouth at bedtime as needed for sleep. Take immediately before bedtime -  eszopiclone (LUNESTA) 2 MG TABS tablet; Take 1 tablet (2 mg total) by mouth at bedtime as needed for sleep. Take immediately before bedtime  Chronic migraine without aura without status migrainosus, not intractable- I recommended she treat this with intermittent use of a CGRP antagonist. -     Ubrogepant (UBRELVY) 100 MG TABS; Take 1 tablet by mouth daily as needed.  Encounter for routine checking of intrauterine contraceptive device (IUD)   I am having Enslie Stanly start on [place]. I am also having her maintain her Probiotic Product (PROBIOTIC DAILY PO), multivitamin with minerals, lisinopril, buPROPion, rosuvastatin, aspirin, clopidogrel, eszopiclone, and Mirena (52 MG).  Meds ordered this encounter  Medications  . Ubrogepant (UBRELVY) 100 MG TABS    Sig: Take 1 tablet by mouth daily as needed.    Dispense:  30 tablet    Refill:  1  . DISCONTD: eszopiclone (LUNESTA) 2 MG TABS tablet    Sig: Take 1 tablet (2 mg total) by mouth at bedtime as needed for sleep. Take immediately before bedtime    Dispense:  90 tablet    Refill:  1  . DISCONTD: eszopiclone (LUNESTA) 2 MG TABS tablet    Sig: Take 1 tablet (2 mg total) by mouth at bedtime as needed for sleep. Take immediately before bedtime    Dispense:  90 tablet    Refill:  1  .  eszopiclone (LUNESTA) 2 MG TABS tablet    Sig: Take 1 tablet (2 mg total) by mouth at bedtime as needed for sleep. Take immediately before bedtime    Dispense:  90 tablet    Refill:  1     Follow-up: Return in about 6 months (around 01/07/2021).  Sanda Linger, MD

## 2020-07-08 NOTE — Telephone Encounter (Signed)
To medical records.

## 2020-07-09 ENCOUNTER — Ambulatory Visit: Payer: 59 | Admitting: Cardiology

## 2020-07-12 ENCOUNTER — Ambulatory Visit: Payer: 59 | Admitting: Speech Pathology

## 2020-07-15 ENCOUNTER — Encounter: Payer: Self-pay | Admitting: Physician Assistant

## 2020-07-15 ENCOUNTER — Other Ambulatory Visit: Payer: Self-pay

## 2020-07-15 ENCOUNTER — Ambulatory Visit (INDEPENDENT_AMBULATORY_CARE_PROVIDER_SITE_OTHER): Payer: 59 | Admitting: Physician Assistant

## 2020-07-15 VITALS — BP 130/70 | HR 80 | Ht 66.0 in | Wt 212.0 lb

## 2020-07-15 DIAGNOSIS — Z8774 Personal history of (corrected) congenital malformations of heart and circulatory system: Secondary | ICD-10-CM

## 2020-07-15 DIAGNOSIS — I10 Essential (primary) hypertension: Secondary | ICD-10-CM | POA: Diagnosis not present

## 2020-07-15 DIAGNOSIS — I639 Cerebral infarction, unspecified: Secondary | ICD-10-CM

## 2020-07-15 NOTE — Progress Notes (Signed)
[facility]   MULTIDISCIPLINARY HEART VALVE CLINIC                                     Cardiology Office Note:    Date:  07/15/2020   ID:  Hoang Reich, DOB 05-14-1975, MRN [id number]  PCP:  Etta Grandchild, MD  [facility] Cardiologist:  Dr. Townsend [facility] Electrophysiologist:  None   Referring MD: Etta Grandchild, MD   1 month s/p PFO closure   History of Present Illness:    Tracey [name] is a 45 y.o. female with a hx of hypertension, stroke, prior tobacco abuse  and PFO s/p PFO closure (05/20/20) who presents for follow-up.   She was diagnosed with hypertension after the birth of her youngest child in 2020.  She was started on lisinopril at that time.  She presented to the hospital 03/2020 with an acute stroke with neck discomfort and left-sided numbness and weakness.  She was found to have a small acute infarct in the right posterior frontal lobe.  It was thought to be embolic. LE dopplers negative for DVT. Monitor did not show afib. She followed up with Dr. Excell Seltzer and had a 25 mm Amplatzer occluder device implanted on 06/16/2020.  Postprocedural echo was stable with no residual shunting. She was kept overnight for observation given patient anxiety.  Seen by Dr. Duke Salvia on 06/23/20 for follow up and doing okay with a dull ache at her groin. They discussed family planning. Also had a migraine that took her to the ER. Saw PCP after and started on medication that is helping.   Today she presents to clinic for follow up. No CP or SOB. No LE edema, orthopnea or PND. No dizziness or syncope. No blood in stool or urine. No palpitations. Considering getting IUD removed soon to potentially have another child. She will work with her OB and Dr. Lang Snow to make sure she does this safetly.    Past Medical History:  Diagnosis Date  . Anxiety   . Bronchitis   . Carpal tunnel syndrome on both sides   . Depression   . Family history of adverse reaction to anesthesia     mother had problems waking up from surgery.  . Gestational diabetes mellitus    Normal 2 hr GTT postpartum  . Headache    migraines  . Hypertension   . Oligohydramnios antepartum 11/09/2017  . Panic attack 2017   diagnosed 3 months ago. no meds presently  . Reflux    did not filll prescription  . S/P percutaneous patent foramen ovale closure 06/16/2020   s/p PFO occluder device with a a 25 MM AMPLATZER by Dr. Excell Seltzer  . Stroke [facility])     Past Surgical History:  Procedure Laterality Date  . BREAST BIOPSY  right    cyst  . BUBBLE STUDY  04/16/2020   Procedure: BUBBLE STUDY;  Surgeon: Chilton Si, MD;  Location: [facility] ENDOSCOPY;  Service: Cardiovascular;;  . DILATION AND EVACUATION N/A 07/26/2015   Procedure: DILATATION AND EVACUATION;  Surgeon: Brock Bad, MD;  Location: [facility] ORS;  Service: Gynecology;  Laterality: N/A;  . PATENT FORAMEN OVALE(PFO) CLOSURE N/A 06/16/2020   Procedure: PATENT FORAMEN OVALE (PFO) CLOSURE;  Surgeon: Tonny Bollman, MD;  Location: [facility] INVASIVE CV LAB;  Service: Cardiovascular;  Laterality: N/A;  . TEE WITHOUT CARDIOVERSION N/A 04/16/2020   Procedure: TRANSESOPHAGEAL ECHOCARDIOGRAM (TEE);  Surgeon: Chilton Si, MD;  Location: [facility] ENDOSCOPY;  Service: Cardiovascular;  Laterality: N/A;    Current Medications: Current Meds  Medication Sig  . aspirin 81 MG EC tablet Take 81 mg by mouth daily. Swallow whole.  Marland Kitchen buPROPion (WELLBUTRIN) 75 MG tablet Take 1 tablet (75 mg total) by mouth 2 (two) times daily.  . clopidogrel (PLAVIX) 75 MG tablet Take 1 tablet (75 mg total) by mouth daily.  . eszopiclone (LUNESTA) 2 MG TABS tablet Take 1 tablet (2 mg total) by mouth at bedtime as needed for sleep. Take immediately before bedtime  . levonorgestrel (MIRENA, 52 MG,) 20 MCG/24HR IUD 1 Intra Uterine Device (1 each total) by Intrauterine route once for 1 dose.  Marland Kitchen lisinopril (ZESTRIL) 20 MG tablet Take 1.5 tablets (30 mg total) by mouth daily.  . Multiple  Vitamins-Minerals (MULTIVITAMIN WITH MINERALS) tablet Take 1 tablet by mouth daily.  . Probiotic Product (PROBIOTIC DAILY PO) Take 1 tablet by mouth daily.   . rosuvastatin (CRESTOR) 10 MG tablet Take 1 tablet (10 mg total) by mouth daily.  Marland Kitchen Ubrogepant (UBRELVY) 100 MG TABS Take 1 tablet by mouth daily as needed.     Allergies:   Patient has no known allergies.   Social History   Socioeconomic History  . Marital status: Married    Spouse name: Not on file  . Number of children: Not on file  . Years of education: Not on file  . Highest education level: Not on file  Occupational History  . Not on file  Tobacco Use  . Smoking status: Current Some Day Smoker    Packs/day: 0.25    Types: Cigarettes  . Smokeless tobacco: Never Used  . Tobacco comment: 2 cig/day  Vaping Use  . Vaping Use: Never used  Substance and Sexual Activity  . Alcohol use: No    Alcohol/week: 0.0 standard drinks  . Drug use: No  . Sexual activity: Not Currently  Other Topics Concern  . Not on file  Social History Narrative  . Not on file   Social Determinants of Health   Financial Resource Strain: Not on file  Food Insecurity: Not on file  Transportation Needs: Not on file  Physical Activity: Not on file  Stress: Not on file  Social Connections: Not on file     Family History: The patient's family history includes Breast cancer in her maternal aunt; Diabetes in her maternal aunt and maternal uncle; Hypertension in her mother.  ROS:   Please see the history of present illness.    All other systems reviewed and are negative.  EKGs/Labs/Other Studies Reviewed:    The following studies were reviewed today:   Limited echo 06/16/2020 IMPRESSIONS  1. Left ventricular ejection fraction, by estimation, is 60 to 65%. The  left ventricle has normal function. The left ventricle has no regional  wall motion abnormalities.  2. Right ventricular systolic function is normal. The right ventricular   size is normal. There is normal pulmonary artery systolic pressure.  3. 25 mm Amplatzer occluder device in good position across septum No  obvious residual PFO Frame 15 had color flow at aortic edge of septum on  RA side but I believe this is caval flow.  4. The mitral valve is normal in structure. No evidence of mitral valve  regurgitation. No evidence of mitral stenosis.  5. The aortic valve is normal in structure. Aortic valve regurgitation is  not visualized. No aortic stenosis is present.  6. The inferior  vena cava is normal in size with greater than 50%  respiratory variability, suggesting right atrial pressure of 3 mmHg.   _______________________  06/16/20 PATENT FORAMEN OVALE (PFO) CLOSURE  Conclusion SUCCESSFUL TRANSCATHETER PFO CLOSURE USING INTRACARDIAC ECHO AND FLUOROSCOPIC GUIDANCE. DEFECT CLOSED WITH A 25 MM AMPLATZER PFO OCCLUDER DEVICE  RECOMMEND:  ASA/CLOPIDOGREL X 6 MONTHS  SBE PROPHYLAXIS X 6 MONTHS  LIMITED 2D ECHO POST-PROCEDURE ASSESSMENT  Ambulatory monitor 05/2020:  Patient's monitoring period was from 04/16/20-05/15/20  Predominant rhythm was NSR with average HR 69bpm ranging from 45bpm to 127bpm.  There were rare SVEs (<1%), occasional PVCs (1%, 16977)  No afib, significant pauses or VT  Overall, no significant arrhythmias detected on the monitor    EKG:  EKG is not ordered today.  Recent Labs: 04/03/2020: ALT 19 04/09/2020: Magnesium 1.9; TSH 2.93 05/28/2020: BUN 10; Creatinine, Ser 1.04; Hemoglobin 14.2; Platelets 285; Potassium 4.4; Sodium 138  Recent Lipid Panel    Component Value Date/Time   CHOL 149 04/03/2020 0343   TRIG 55 04/03/2020 0343   HDL 37 (L) 04/03/2020 0343   CHOLHDL 4.0 04/03/2020 0343   VLDL 11 04/03/2020 0343   LDLCALC 101 (H) 04/03/2020 0343     Risk Assessment/Calculations:       Physical Exam:    VS:  BP 130/70   Pulse 80   Ht 5\' 6"  (1.676 m)   Wt 212 lb (96.2 kg)   SpO2 98%   BMI 34.22 kg/m      Wt Readings from Last 3 Encounters:  07/15/20 212 lb (96.2 kg)  07/08/20 218 lb (98.9 kg)  07/04/20 215 lb 6.2 oz (97.7 kg)     GEN: Well nourished, well developed in no acute distress HEENT: Normal NECK: No JVD; No carotid bruits LYMPHATICS: No lymphadenopathy CARDIAC: RRR, no murmurs, rubs, gallops RESPIRATORY:  Clear to auscultation without rales, wheezing or rhonchi  ABDOMEN: Soft, non-tender, non-distended MUSCULOSKELETAL:  No edema; No deformity  SKIN: Warm and dry NEUROLOGIC:  Alert and oriented x 3 PSYCHIATRIC:  Normal affect   ASSESSMENT:    1. S/P percutaneous patent foramen ovale closure   2. Primary hypertension   3. Cryptogenic stroke (HCC) - R frontal lobe    PLAN:    In order of problems listed above:  PFO s/p PFO closure: doing excellent. Groin site all healed up. No new neurologic symptoms. She will be continued on DAPT with aspirin and plavix for at least 6 months. She understands the need for SBE prophylaxis x 6 months, and she doesn't have a dentist apt until October. I will see her back in 1 year with echo/bubble.   HTN: BP well controlled today. COntonue on Lisiopril   Cryptogenic CVA: continue antiplatelets and statin.   Medication Adjustments/Labs and Tests Ordered: Current medicines are reviewed at length with the patient today.  Concerns regarding medicines are outlined above.  Orders Placed This Encounter  Procedures  . ECHOCARDIOGRAM LIMITED BUBBLE STUDY   No orders of the defined types were placed in this encounter.   Patient Instructions  Medication Instructions:  1) STOP PLAVIX 6 months after your closure (12/17/2020) 2) CALL 12/19/2020 if you have a dental appointment in the next few months *If you need a refill on your cardiac medications before your next appointment, please call your pharmacy*  Follow-Up: You are scheduled for your 1 year echo and visit with [place] on 06/15/21. Please arrive at 12:45PM for these visits. There  are no restrictions.  Signed, Cline CrockKathryn Myleah Cavendish, PA-C  07/15/2020 2:10 PM    [facility]

## 2020-07-15 NOTE — Patient Instructions (Signed)
Medication Instructions:  1) STOP PLAVIX 6 months after your closure (12/17/2020) 2) CALL us if you have a dental appointment in the next few months *If you need a refill on your cardiac medications before your next appointment, please call your pharmacy*  Follow-Up: You are scheduled for your 1 year echo and visit with Carlean Jews on 06/15/21. Please arrive at 12:45PM for these visits. There are no restrictions.

## 2020-07-16 ENCOUNTER — Encounter: Payer: Self-pay | Admitting: Speech Pathology

## 2020-07-16 ENCOUNTER — Ambulatory Visit: Payer: 59 | Admitting: Speech Pathology

## 2020-07-16 DIAGNOSIS — R41841 Cognitive communication deficit: Secondary | ICD-10-CM

## 2020-07-16 NOTE — Therapy (Signed)
[facility] [address] [place], [place], [address] Phone: [phone]   Fax:  [phone]  Speech Language Pathology Treatment  Patient Details  Name: Tracey [name] MRN: [id number] Date of Birth: 10-21-1975 Referring Provider (SLP): Etta Grandchild   Encounter Date: 07/16/2020   End of Session - 07/16/20 0825    Visit Number 11    Number of Visits 25    Date for SLP Re-Evaluation 08/02/19    SLP Start Time 0800    SLP Stop Time  0842    SLP Time Calculation (min) 42 min    Activity Tolerance Patient tolerated treatment well;Patient limited by fatigue           Past Medical History:  Diagnosis Date  . Anxiety   . Bronchitis   . Carpal tunnel syndrome on both sides   . Depression   . Family history of adverse reaction to anesthesia    mother had problems waking up from surgery.  . Gestational diabetes mellitus    Normal 2 hr GTT postpartum  . Headache    migraines  . Hypertension   . Oligohydramnios antepartum 11/09/2017  . Panic attack 2017   diagnosed 3 months ago. no meds presently  . Reflux    did not filll prescription  . S/P percutaneous patent foramen ovale closure 06/16/2020   s/p PFO occluder device with a a 25 MM AMPLATZER by Dr. Excell Seltzer  . Stroke [facility])     Past Surgical History:  Procedure Laterality Date  . BREAST BIOPSY  right    cyst  . BUBBLE STUDY  04/16/2020   Procedure: BUBBLE STUDY;  Surgeon: Chilton Si, MD;  Location: [facility] ENDOSCOPY;  Service: Cardiovascular;;  . DILATION AND EVACUATION N/A 07/26/2015   Procedure: DILATATION AND EVACUATION;  Surgeon: Brock Bad, MD;  Location: [facility] ORS;  Service: Gynecology;  Laterality: N/A;  . PATENT FORAMEN OVALE(PFO) CLOSURE N/A 06/16/2020   Procedure: PATENT FORAMEN OVALE (PFO) CLOSURE;  Surgeon: Tonny Bollman, MD;  Location: [facility] INVASIVE CV LAB;  Service: Cardiovascular;  Laterality: N/A;  . TEE WITHOUT CARDIOVERSION N/A 04/16/2020    Procedure: TRANSESOPHAGEAL ECHOCARDIOGRAM (TEE);  Surgeon: Chilton Si, MD;  Location: [facility] ENDOSCOPY;  Service: Cardiovascular;  Laterality: N/A;    There were no vitals filed for this visit.   Subjective Assessment - 07/16/20 0823    Subjective Pt reports she is having trouble getting organized.    Currently in Pain? No/denies                 ADULT SLP TREATMENT - 07/16/20 0828      General Information   Behavior/Cognition Alert;Cooperative;Pleasant mood      Treatment Provided   Treatment provided Cognitive-Linquistic      Cognitive-Linquistic Treatment   Treatment focused on Cognition    Skilled Treatment Developed a plan to assist with self-organization. Pt reports she continues to struggle with attention and memory. Reports increased stress at home. Introduced Brewing technologist.      Assessment / Recommendations / Plan   Plan Continue with current plan of care      Progression Toward Goals   Progression toward goals Progressing toward goals            SLP Education - 07/16/20 0824    Education Details Organization    Person(s) Educated Patient    Methods Explanation;Demonstration    Comprehension Verbalized understanding            SLP  Short Term Goals - 07/16/20 0826      SLP SHORT TERM GOAL #1   Title Pt will utilize word finding strategies (of a list) given a verbal prompt to increase word finding ability.    Period Weeks    Status Achieved      SLP SHORT TERM GOAL #2   Title Pt will recall 2 memory strategies to assist with medication and financial management.    Time 1    Period Weeks    Status On-going      SLP SHORT TERM GOAL #3   Title Patient will demo attention to detail in school/work-like material with double checking/self-corrections.    Time 1    Period Weeks    Status On-going      SLP SHORT TERM GOAL #4   Title Patient will complete planning/organization exercises with mod verbal and visual cues.    Time 1    Period  Weeks    Status On-going            SLP Long Term Goals - 07/16/20 0827      SLP LONG TERM GOAL #1   Title Patient will participate in restorative and compensatory therapy to improve verbal expression by incorporating strategies to communicate wants and needs effectively to different conversational partners, maintain safety and participate socially in their functional living environment.    Time 7    Period Weeks    Status On-going      SLP LONG TERM GOAL #2   Title Patient will demonstrate use of memory strategies to schedule activities, recall weekly events and items to maintain safety to participate socially in functional living environment    Time 7    Period Weeks    Status On-going      SLP LONG TERM GOAL #3   Title Patient will increase information processing speed during planning and organization daily living activities to improve safety and awareness in functional living environment    Time 7    Period Weeks    Status New      SLP LONG TERM GOAL #4   Title Patient will develop functional attention skills to effectively attend to and communicate in tasks of daily living in their functional living environment.    Time 7    Period Weeks    Status On-going            Plan - 07/16/20 0826    Clinical Impression Statement Pt is a 45 yo female with hx significant for RCVA. 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 She would like to get back to work and continue pursuing her bachelors degree. Since the CVA, patient reports trouble with recalling words and difficulty holding information in her head. Cognitive-communication was assessed this date using the SLUMS. Patient scored a 17/30 indicating a moderate impairment. Difficulty noted in working memory, short term memory, attention, and generative naming. Informal assessment of receptive and expressive language skills noted rare anomia, but more disorganized thought processes.  Given extra time, patient was able to achieve the correct word. SLP rec skilled speech services to address expressive aphasia and cognitive-communication impairment to achieve participation in all ADLs.    Speech Therapy Frequency 2x / week    Duration 12 weeks    Treatment/Interventions Compensatory strategies;Cueing hierarchy;Functional tasks;Patient/family education;Environmental controls;Cognitive reorganization;Multimodal communcation approach;Language facilitation;Compensatory techniques;Internal/external aids;SLP instruction and feedback    Potential to Achieve Goals Good    Consulted  and Agree with Plan of Care Patient           Patient will benefit from skilled therapeutic intervention in order to improve the following deficits and impairments:   Cognitive communication deficit    Problem List Patient Active Problem List   Diagnosis Date Noted  . Psychophysiological insomnia 07/08/2020  . Chronic migraine without aura without status migrainosus, not intractable 07/08/2020  . S/P percutaneous patent foramen ovale closure 06/16/2020  . Cryptogenic stroke (HCC) - R frontal lobe 05/31/2020  . PFO (patent foramen ovale) - ROPE score 5 05/31/2020  . Hypokalemia 04/03/2020  . Hypertension   . Depression   . Anxiety 11/27/2017  . Vitamin D deficiency 06/07/2017    Dorena Bodo MS, [place], CBIS  07/16/2020, 8:42 AM  [facility] [address] [place], [place], [address] Phone: [phone]   Fax:  [phone]   Name: Tracey [name] MRN: [id number] Date of Birth: July 02, 1975

## 2020-07-19 ENCOUNTER — Ambulatory Visit: Payer: 59 | Admitting: Speech Pathology

## 2020-07-19 ENCOUNTER — Other Ambulatory Visit: Payer: Self-pay

## 2020-07-21 ENCOUNTER — Encounter: Payer: Self-pay | Admitting: Speech Pathology

## 2020-07-21 ENCOUNTER — Ambulatory Visit (INDEPENDENT_AMBULATORY_CARE_PROVIDER_SITE_OTHER): Payer: 59 | Admitting: Family Medicine

## 2020-07-21 ENCOUNTER — Other Ambulatory Visit: Payer: Self-pay

## 2020-07-21 ENCOUNTER — Encounter (INDEPENDENT_AMBULATORY_CARE_PROVIDER_SITE_OTHER): Payer: Self-pay | Admitting: Family Medicine

## 2020-07-21 ENCOUNTER — Ambulatory Visit: Payer: 59 | Attending: Internal Medicine | Admitting: Speech Pathology

## 2020-07-21 VITALS — BP 124/71 | HR 66 | Temp 98.0°F | Ht 67.0 in | Wt 209.0 lb

## 2020-07-21 DIAGNOSIS — I10 Essential (primary) hypertension: Secondary | ICD-10-CM

## 2020-07-21 DIAGNOSIS — E7849 Other hyperlipidemia: Secondary | ICD-10-CM

## 2020-07-21 DIAGNOSIS — E559 Vitamin D deficiency, unspecified: Secondary | ICD-10-CM

## 2020-07-21 DIAGNOSIS — R0602 Shortness of breath: Secondary | ICD-10-CM | POA: Diagnosis not present

## 2020-07-21 DIAGNOSIS — Z6832 Body mass index (BMI) 32.0-32.9, adult: Secondary | ICD-10-CM

## 2020-07-21 DIAGNOSIS — R5383 Other fatigue: Secondary | ICD-10-CM | POA: Diagnosis not present

## 2020-07-21 DIAGNOSIS — E669 Obesity, unspecified: Secondary | ICD-10-CM

## 2020-07-21 DIAGNOSIS — Z1331 Encounter for screening for depression: Secondary | ICD-10-CM | POA: Diagnosis not present

## 2020-07-21 DIAGNOSIS — Z9189 Other specified personal risk factors, not elsewhere classified: Secondary | ICD-10-CM

## 2020-07-21 DIAGNOSIS — R41841 Cognitive communication deficit: Secondary | ICD-10-CM | POA: Insufficient documentation

## 2020-07-21 DIAGNOSIS — R4701 Aphasia: Secondary | ICD-10-CM | POA: Diagnosis present

## 2020-07-21 DIAGNOSIS — E66811 Obesity, class 1: Secondary | ICD-10-CM

## 2020-07-21 DIAGNOSIS — R739 Hyperglycemia, unspecified: Secondary | ICD-10-CM

## 2020-07-21 DIAGNOSIS — Z0289 Encounter for other administrative examinations: Secondary | ICD-10-CM

## 2020-07-21 NOTE — Therapy (Signed)
[facility] [address] [place]. [place], [place], [address] Phone: [phone]   Fax:  [phone]  Speech Language Pathology Treatment  Patient Details  Name: Tracey [name] MRN: [id number] Date of Birth: 04/21/1975 Referring Provider (SLP): Etta Grandchild   Encounter Date: 07/21/2020   End of Session - 07/21/20 1331    Visit Number 12    Number of Visits 25    Date for SLP Re-Evaluation 08/02/19    SLP Start Time 1230    SLP Stop Time  1328    SLP Time Calculation (min) 58 min    Activity Tolerance Patient tolerated treatment well;Patient limited by fatigue           Past Medical History:  Diagnosis Date  . Anxiety   . Bronchitis   . Carpal tunnel syndrome on both sides   . Constipation   . Depression   . Family history of adverse reaction to anesthesia    mother had problems waking up from surgery.  . Gestational diabetes   . Gestational diabetes mellitus    Normal 2 hr GTT postpartum  . Headache    migraines  . High cholesterol   . Hypertension   . Infertility, female   . Lactose intolerance   . Oligohydramnios antepartum 11/09/2017  . Palpitations   . Panic attack 2017   diagnosed 3 months ago. no meds presently  . Reflux    did not filll prescription  . S/P percutaneous patent foramen ovale closure 06/16/2020   s/p PFO occluder device with a a 25 MM AMPLATZER by Dr. Excell Seltzer  . Stroke (HCC)   . Swelling   . Vitamin D deficiency     Past Surgical History:  Procedure Laterality Date  . BREAST BIOPSY  right    cyst  . BUBBLE STUDY  04/16/2020   Procedure: BUBBLE STUDY;  Surgeon: Chilton Si, MD;  Location: [facility] ENDOSCOPY;  Service: Cardiovascular;;  . DILATION AND EVACUATION N/A 07/26/2015   Procedure: DILATATION AND EVACUATION;  Surgeon: Brock Bad, MD;  Location: [facility] ORS;  Service: Gynecology;  Laterality: N/A;  . PATENT FORAMEN OVALE(PFO) CLOSURE N/A 06/16/2020   Procedure: PATENT FORAMEN OVALE  (PFO) CLOSURE;  Surgeon: Tonny Bollman, MD;  Location: [facility] INVASIVE CV LAB;  Service: Cardiovascular;  Laterality: N/A;  . TEE WITHOUT CARDIOVERSION N/A 04/16/2020   Procedure: TRANSESOPHAGEAL ECHOCARDIOGRAM (TEE);  Surgeon: Chilton Si, MD;  Location: [facility] ENDOSCOPY;  Service: Cardiovascular;  Laterality: N/A;    There were no vitals filed for this visit.          ADULT SLP TREATMENT - 07/21/20 1328      General Information   Behavior/Cognition Alert;Cooperative;Pleasant mood      Treatment Provided   Treatment provided Cognitive-Linquistic      Cognitive-Linquistic Treatment   Treatment focused on Cognition    Skilled Treatment Pt brought in work from [employer]. She felt overwhelmed with all the directions and what things meant on the meal plan. SLP and pt developed a grocery list by category to assist with thought organization. SLP also instructed patient on how to use weight scale for foods (putting plate on and zero-ing out) so she doesn't have to put the bare food on the scale. Pt demonstrated understanding by following steps SLP made for task completion.      Assessment / Recommendations / Plan   Plan Continue with current plan of care      Progression Toward Goals  Progression toward goals Progressing toward goals            SLP Education - 07/21/20 1328    Education Details Organization    Person(s) Educated Patient    Methods Explanation;Demonstration;Handout    Comprehension Verbalized understanding;Returned demonstration            SLP Short Term Goals - 07/21/20 1332      SLP SHORT TERM GOAL #1   Title Pt will utilize word finding strategies (of a list) given a verbal prompt to increase word finding ability.    Period Weeks    Status Achieved      SLP SHORT TERM GOAL #2   Title Pt will recall 2 memory strategies to assist with medication and financial management.    Time 1    Period Weeks    Status On-going      SLP  SHORT TERM GOAL #3   Title Patient will demo attention to detail in school/work-like material with double checking/self-corrections.    Time 1    Period Weeks    Status On-going      SLP SHORT TERM GOAL #4   Title Patient will complete planning/organization exercises with mod verbal and visual cues.    Time 1    Period Weeks    Status On-going            SLP Long Term Goals - 07/21/20 1333      SLP LONG TERM GOAL #1   Title Patient will participate in restorative and compensatory therapy to improve verbal expression by incorporating strategies to communicate wants and needs effectively to different conversational partners, maintain safety and participate socially in their functional living environment.    Time 7    Period Weeks    Status On-going      SLP LONG TERM GOAL #2   Title Patient will demonstrate use of memory strategies to schedule activities, recall weekly events and items to maintain safety to participate socially in functional living environment    Time 7    Period Weeks    Status On-going      SLP LONG TERM GOAL #3   Title Patient will increase information processing speed during planning and organization daily living activities to improve safety and awareness in functional living environment    Time 7    Period Weeks    Status New      SLP LONG TERM GOAL #4   Title Patient will develop functional attention skills to effectively attend to and communicate in tasks of daily living in their functional living environment.    Time 7    Period Weeks    Status On-going            Plan - 07/21/20 1331    Clinical Impression Statement Pt continues to have difficulty with thought organization and managing some day-to-day tasks. Pt required min-to-modA to complete all tasks today. She benefited from extra time for processing and step-by-step breakdown of tasks. SLP rec skilled speech srevices to address cognitive communication impairment to achieve participation in  all ADLs.    Speech Therapy Frequency 2x / week    Duration 12 weeks    Treatment/Interventions Compensatory strategies;Cueing hierarchy;Functional tasks;Patient/family education;Environmental controls;Cognitive reorganization;Multimodal communcation approach;Language facilitation;Compensatory techniques;Internal/external aids;SLP instruction and feedback    Potential to Achieve Goals Good    Consulted and Agree with Plan of Care Patient           Patient will benefit from skilled therapeutic  intervention in order to improve the following deficits and impairments:   Cognitive communication deficit    Problem List Patient Active Problem List   Diagnosis Date Noted  . Psychophysiological insomnia 07/08/2020  . Chronic migraine without aura without status migrainosus, not intractable 07/08/2020  . S/P percutaneous patent foramen ovale closure 06/16/2020  . Cryptogenic stroke (HCC) - R frontal lobe 05/31/2020  . PFO (patent foramen ovale) - ROPE score 5 05/31/2020  . Hypokalemia 04/03/2020  . Hypertension   . Depression   . Anxiety 11/27/2017  . Vitamin D deficiency 06/07/2017    Dorena Bodo MS, [place], CBIS  07/21/2020, 1:34 PM  [facility] [address]. [place], [place], [address] Phone: [phone]   Fax:  [phone]   Name: Tracey [name] MRN: [id number] Date of Birth: Aug 14, 1975

## 2020-07-22 ENCOUNTER — Telehealth: Payer: Self-pay | Admitting: Internal Medicine

## 2020-07-22 NOTE — Telephone Encounter (Signed)
[employer] called in regards to a PA for Ubrogepant (UBRELVY) 100 MG TABS. They can be reached at [phone]

## 2020-07-22 NOTE — Progress Notes (Signed)
Chief Complaint:   OBESITY Tracey [name] (MR# [id number]) is a 45 y.o. female who presents for evaluation and treatment of obesity and related comorbidities. Current BMI is Body mass index is 32.73 kg/m. Tracey [name] has been struggling with her weight for many years and has been unsuccessful in either losing weight, maintaining weight loss, or reaching her healthy weight goal.  Tracey [name] is currently in the action stage of change and ready to dedicate time achieving and maintaining a healthier weight. Tracey [name] is interested in becoming our patient and working on intensive lifestyle modifications including (but not limited to) diet and exercise for weight loss.  Tracey [name]'s habits were reviewed today and are as follows: Her family eats meals together, she thinks her family will eat healthier with her, her desired weight loss is 19 lbs, she started gaining weight after her youngest child, her heaviest weight ever was 235 pounds, she has significant food cravings issues, she snacks frequently in the evenings, she skips meals frequently, she is frequently drinking liquids with calories, she frequently makes poor food choices, she frequently eats larger portions than normal and she struggles with emotional eating.  Depression Screen Tracey [name]'s Food and Mood (modified PHQ-9) score was 11.  Depression screen PHQ 2/9 07/21/2020  Decreased Interest 2  Down, Depressed, Hopeless 1  PHQ - 2 Score 3  Altered sleeping 1  Tired, decreased energy 2  Change in appetite 3  Feeling bad or failure about yourself  0  Trouble concentrating 2  Moving slowly or fidgety/restless 0  Suicidal thoughts 0  PHQ-9 Score 11  Difficult doing work/chores -   Subjective:   1. Other fatigue Tracey [name] admits to daytime somnolence and admits to waking up still tired. Patent has a history of symptoms of daytime fatigue. Tracey [name] generally gets 2 hours of sleep per night, and states that she has difficulty falling asleep and nightime awakenings.  Snoring is present. Apneic episodes are present. Epworth Sleepiness Score is 4.  2. Shortness of breath on exertion Tracey [name] notes increasing shortness of breath with exercising and seems to be worsening over time with weight gain. She notes getting out of breath sooner with activity than she used to. This has not gotten worse recently. Tracey [name] denies shortness of breath at rest or orthopnea.  3. Hyperglycemia Tracey [name] has a history of some elevated glucose readings and gestational diabetes mellitus. She is working on diet.  4. Essential hypertension Tracey [name] blood pressure is stable on lisinopril, and she is working on diet.  5. Other hyperlipidemia Tracey [name] is on Crestor, and she is status post cerebrovascular accident due to patent foramen ovale, and status post repair.  6. Vitamin D deficiency Tracey [name] is on multivitamins, and she has no recent labs. She notes fatigue.  7. At risk for heart disease Tracey [name] is at a higher than average risk for cardiovascular disease due to obesity.   Assessment/Plan:   1. Other fatigue Tracey [name] does feel that her weight is causing her energy to be lower than it should be. Fatigue may be related to obesity, depression or many other causes. Labs will be ordered, and in the meanwhile, Tracey [name] will focus on self care including making healthy food choices, increasing physical activity and focusing on stress reduction.  - Vitamin B12 - CBC with Differential/Platelet - T3 - T4, free - TSH - Folate  2. Shortness of breath on exertion Tracey [name] does feel that she gets out of breath more easily that she used to when she exercises. Tracey [name] shortness  of breath appears to be obesity related and exercise induced. She has agreed to work on weight loss and gradually increase exercise to treat her exercise induced shortness of breath. Will continue to monitor closely.  3. Hyperglycemia We will check labs today, and results with be discussed with Tracey [name] in 2 weeks at her follow up visit.  In the meanwhile Tracey [name] will start on her Category 3 plan and will work on weight loss efforts.  - Hemoglobin A1c - Insulin, random  4. Essential hypertension Tracey [name] will start on her Category 3 plan, and will work on exercise and weight loss to improve blood pressure control. We will watch for signs of hypotension as she continues her lifestyle modifications. We will check labs today.  - Comprehensive metabolic panel  5. Other hyperlipidemia Cardiovascular risk and specific lipid/LDL goals reviewed. We discussed several lifestyle modifications today. We will check labs today. Tracey [name] will continue start her Category 3 plan, and will work on exercise and weight loss efforts. Orders and follow up as documented in patient record.   - Lipid Panel With LDL/HDL Ratio  6. Vitamin D deficiency Low Vitamin D level contributes to fatigue and are associated with obesity, breast, and colon cancer. We will check labs today. Tracey [name] will follow-up for routine testing of Vitamin D, at least 2-3 times per year to avoid over-replacement.  - VITAMIN D 25 Hydroxy (Vit-D Deficiency, Fractures)  7. Depression screening Tracey [name] had a positive depression screening. Depression is commonly associated with obesity and often results in emotional eating behaviors. We will monitor this closely and work on CBT to help improve the non-hunger eating patterns. Referral to Psychology may be required if no improvement is seen as she continues in our clinic.  8. At risk for heart disease Tracey [name] was given approximately 30 minutes of coronary artery disease prevention counseling today. She is 45 y.o. female and has risk factors for heart disease including obesity. We discussed intensive lifestyle modifications today with an emphasis on specific weight loss instructions and strategies.   Repetitive spaced learning was employed today to elicit superior memory formation and behavioral change.  9. Obesity with Current BMI 32.7 Tracey [name] is  currently in the action stage of change and her goal is to continue with weight loss efforts. I recommend Tracey [name] begin the structured treatment plan as follows:  She has agreed to the Category 3 Plan.  Exercise goals: No exercise has been prescribed for now, while we concentrate on nutritional changes.   Behavioral modification strategies: increasing lean protein intake and meal planning and cooking strategies.  She was informed of the importance of frequent follow-up visits to maximize her success with intensive lifestyle modifications for her multiple health conditions. She was informed we would discuss her lab results at her next visit unless there is a critical issue that needs to be addressed sooner. Tracey [name] agreed to keep her next visit at the agreed upon time to discuss these results.  Objective:   Blood pressure 124/71, pulse 66, temperature 98 F (36.7 C), height 5\' 7"  (1.702 m), weight 209 lb (94.8 kg), SpO2 100 %. Body mass index is 32.73 kg/m.  EKG: Normal sinus rhythm, rate 69 BPM.  Indirect Calorimeter completed today shows a VO2 of 317 and a REE of 2209.  Her calculated basal metabolic rate is 2210 thus her basal metabolic rate is better than expected.  General: Cooperative, alert, well developed, in no acute distress. HEENT: Conjunctivae and lids unremarkable. Cardiovascular: Regular rhythm.  Lungs: Normal work  of breathing. Neurologic: No focal deficits.   Lab Results  Component Value Date   CREATININE 1.04 (H) 07/21/2020   BUN 9 07/21/2020   NA 140 07/21/2020   K 4.0 07/21/2020   CL 105 07/21/2020   CO2 21 07/21/2020   Lab Results  Component Value Date   ALT 15 07/21/2020   AST 13 07/21/2020   ALKPHOS 59 07/21/2020   BILITOT 0.5 07/21/2020   Lab Results  Component Value Date   HGBA1C 5.5 07/21/2020   HGBA1C 5.1 04/03/2020   HGBA1C 5.1 11/10/2017   HGBA1C 5.6 06/04/2017   Lab Results  Component Value Date   INSULIN WILL FOLLOW 07/21/2020   Lab  Results  Component Value Date   TSH 1.130 07/21/2020   Lab Results  Component Value Date   CHOL 102 07/21/2020   HDL 40 07/21/2020   LDLCALC 51 07/21/2020   TRIG 40 07/21/2020   CHOLHDL 4.0 04/03/2020   Lab Results  Component Value Date   WBC 6.5 07/21/2020   HGB 13.9 07/21/2020   HCT 43.0 07/21/2020   MCV 89 07/21/2020   PLT 279 07/21/2020   No results found for: IRON, TIBC, FERRITIN  Attestation Statements:   Reviewed by clinician on day of visit: allergies, medications, problem list, medical history, surgical history, family history, social history, and previous encounter notes.   I, Burt Knack, am acting as transcriptionist for Quillian Quince, MD.  I have reviewed the above documentation for accuracy and completeness, and I agree with the above. - Quillian Quince, MD

## 2020-07-23 ENCOUNTER — Telehealth: Payer: Self-pay

## 2020-07-23 NOTE — Telephone Encounter (Signed)
Key: [id number]

## 2020-07-23 NOTE — Telephone Encounter (Signed)
PA has been initiated.

## 2020-07-25 LAB — CBC WITH DIFFERENTIAL/PLATELET
Basophils Absolute: 0.1 10*3/uL (ref 0.0–0.2)
Basos: 1 %
EOS (ABSOLUTE): 0.1 10*3/uL (ref 0.0–0.4)
Eos: 2 %
Hematocrit: 43 % (ref 34.0–46.6)
Hemoglobin: 13.9 g/dL (ref 11.1–15.9)
Immature Grans (Abs): 0 10*3/uL (ref 0.0–0.1)
Immature Granulocytes: 0 %
Lymphocytes Absolute: 2.3 10*3/uL (ref 0.7–3.1)
Lymphs: 35 %
MCH: 28.8 pg (ref 26.6–33.0)
MCHC: 32.3 g/dL (ref 31.5–35.7)
MCV: 89 fL (ref 79–97)
Monocytes Absolute: 0.5 10*3/uL (ref 0.1–0.9)
Monocytes: 8 %
Neutrophils Absolute: 3.5 10*3/uL (ref 1.4–7.0)
Neutrophils: 54 %
Platelets: 279 10*3/uL (ref 150–450)
RBC: 4.82 x10E6/uL (ref 3.77–5.28)
RDW: 12.9 % (ref 11.7–15.4)
WBC: 6.5 10*3/uL (ref 3.4–10.8)

## 2020-07-25 LAB — COMPREHENSIVE METABOLIC PANEL
ALT: 15 IU/L (ref 0–32)
AST: 13 IU/L (ref 0–40)
Albumin/Globulin Ratio: 2 (ref 1.2–2.2)
Albumin: 4.5 g/dL (ref 3.8–4.8)
Alkaline Phosphatase: 59 IU/L (ref 44–121)
BUN/Creatinine Ratio: 9 (ref 9–23)
BUN: 9 mg/dL (ref 6–24)
Bilirubin Total: 0.5 mg/dL (ref 0.0–1.2)
CO2: 21 mmol/L (ref 20–29)
Calcium: 8.9 mg/dL (ref 8.7–10.2)
Chloride: 105 mmol/L (ref 96–106)
Creatinine, Ser: 1.04 mg/dL — ABNORMAL HIGH (ref 0.57–1.00)
Globulin, Total: 2.3 g/dL (ref 1.5–4.5)
Glucose: 88 mg/dL (ref 65–99)
Potassium: 4 mmol/L (ref 3.5–5.2)
Sodium: 140 mmol/L (ref 134–144)
Total Protein: 6.8 g/dL (ref 6.0–8.5)
eGFR: 69 mL/min/{1.73_m2} (ref >59)

## 2020-07-25 LAB — VITAMIN D 25 HYDROXY (VIT D DEFICIENCY, FRACTURES): Vit D, 25-Hydroxy: 59.1 ng/mL (ref 30.0–100.0)

## 2020-07-25 LAB — LIPID PANEL WITH LDL/HDL RATIO
Cholesterol, Total: 102 mg/dL (ref 100–199)
HDL: 40 mg/dL (ref >39)
LDL Chol Calc (NIH): 51 mg/dL (ref 0–99)
LDL/HDL Ratio: 1.3 ratio (ref 0.0–3.2)
Triglycerides: 40 mg/dL (ref 0–149)
VLDL Cholesterol Cal: 11 mg/dL (ref 5–40)

## 2020-07-25 LAB — T4, FREE: Free T4: 1.31 ng/dL (ref 0.82–1.77)

## 2020-07-25 LAB — TSH: TSH: 1.13 u[IU]/mL (ref 0.450–4.500)

## 2020-07-25 LAB — HEMOGLOBIN A1C
Est. average glucose Bld gHb Est-mCnc: 111 mg/dL
Hgb A1c MFr Bld: 5.5 % (ref 4.8–5.6)

## 2020-07-25 LAB — INSULIN, RANDOM: INSULIN: 16.6 u[IU]/mL (ref 2.6–24.9)

## 2020-07-25 LAB — T3: T3, Total: 117 ng/dL (ref 71–180)

## 2020-07-25 LAB — VITAMIN B12: Vitamin B-12: 825 pg/mL (ref 232–1245)

## 2020-07-25 LAB — FOLATE: Folate: 13.6 ng/mL (ref >3.0)

## 2020-07-26 ENCOUNTER — Ambulatory Visit: Payer: 59 | Admitting: Speech Pathology

## 2020-07-26 NOTE — Telephone Encounter (Signed)
PA was denied

## 2020-07-27 ENCOUNTER — Ambulatory Visit: Payer: 59 | Admitting: Obstetrics

## 2020-07-28 ENCOUNTER — Ambulatory Visit: Payer: 59 | Admitting: Speech Pathology

## 2020-08-02 ENCOUNTER — Encounter: Payer: Self-pay | Admitting: Adult Health

## 2020-08-02 ENCOUNTER — Ambulatory Visit (INDEPENDENT_AMBULATORY_CARE_PROVIDER_SITE_OTHER): Payer: 59 | Admitting: Adult Health

## 2020-08-02 VITALS — BP 135/83 | HR 65 | Ht 67.0 in | Wt 213.0 lb

## 2020-08-02 DIAGNOSIS — F418 Other specified anxiety disorders: Secondary | ICD-10-CM

## 2020-08-02 DIAGNOSIS — Q211 Atrial septal defect: Secondary | ICD-10-CM | POA: Diagnosis not present

## 2020-08-02 DIAGNOSIS — E785 Hyperlipidemia, unspecified: Secondary | ICD-10-CM

## 2020-08-02 DIAGNOSIS — Q2112 Patent foramen ovale: Secondary | ICD-10-CM

## 2020-08-02 DIAGNOSIS — R41841 Cognitive communication deficit: Secondary | ICD-10-CM | POA: Diagnosis not present

## 2020-08-02 DIAGNOSIS — I10 Essential (primary) hypertension: Secondary | ICD-10-CM

## 2020-08-02 DIAGNOSIS — I639 Cerebral infarction, unspecified: Secondary | ICD-10-CM

## 2020-08-02 NOTE — Progress Notes (Signed)
[facility] [address] [place]. [place] [address] [phone]       STROKE FOLLOW UP NOTE  [name] Date of Birth:  [birth year] Medical Record Number:  [id number]   Reason for Referral: stroke follow up    SUBJECTIVE:   CHIEF COMPLAINT:  Chief Complaint  Patient presents with  . Follow-up    RM 14 with baby Pt Is well and progressing. States she just have complications with cognition such as recall or getting words together     HPI:   Today, 08/02/2020, Tracey [name] returns for 72-month stroke follow-up accompanied by her 43 yo daughter  Doing well since prior visit from stroke standpoint without new stroke/TIA symptoms.  Residual cognitive impairment gradually improving with greatest difficulty with recall or getting words together especially when talking to somebody she does not know or with increased anxiety (per SLP difficulties with thought organization and managing some day-to-day tasks).  She continues to work routinely with neuro rehab SLP.  She remains on short term disability and is eager to return back to work at [employer].   Reports compliance on aspirin, plavix (post PFO closure) and Crestor without associated side effects Blood pressure today 135/83  S/p PFO closure 06/16/2020 without complication Sleep study 06/13/2020 negative for sleep apnea (although only captured 3.5 hours of sleep however all sleep stages were captured) -chronic history of insomnia and migraines recently started on Lunesta and Ubrelvy per PCP with benefit.  Also on bupropion for depression/anxiety.  No further concerns at this time    History provided for reference purposes only Initial visit 05/03/2020 JM: Tracey [name] is being seen for hospital follow-up accompanied by her husband.  She reports gradual improvement since discharge with residual LLE weakness (feels like knee wants to hyperextend), gait impairment and cognitive  impairment.  Currently working with PT and has initial evaluation with SLP tomorrow for cognitive therapy.  She has not returned back to work currently on short-term disability working for [name].  Baseline depression/anxiety with worsening anxiety and depression but also self discontinued bupropion at hospital discharge as she was fearful of possible increased stroke risk with continued use.  Reports increased fatigue since her stroke with fatigue PTA as well as snoring, witnessed apnea and insomnia.  She has not previously underwent sleep study.  Denies new stroke/TIA symptoms.  She has completed 3 weeks DAPT and remains on aspirin alone without bleeding or bruising.  Remains on Crestor 10 mg daily without myalgias.  Blood pressure today 149/77.  She does admit to smoking 1 cigarette/day with goal of complete tobacco cessation in the near future.  TEE completed 04/16/2020 which showed evidence of PFO.  Initial evaluation with Dr. Excell Seltzer for possible closure scheduled on 05/28/2020.  Hypercoagulable labs negative.  No further concerns at this time.  Stroke admission 04/02/2020 [name] is a 45 y.o. female with history of hypertension, anxiety (panic attacks), headaches (migraines), ongoing tobacco use, and depression (just started Wellbutrin on the day of admission)  who presented to the [facility] emergency department  on 04/02/2020 and seen by teleNeurology for acute onset neck discomfort, left leg numbness / weakness, left arm numbness and apressure sensation at the base of her posterior neck in the midline.   Personally reviewed hospitalization pertinent progress notes, lab work and imaging with summary provided.  Shortly transferred to [facility] evaluated by Dr. Pearlean Brownie with stroke work-up revealing small acute infarct in the juxtacortical high right  posterior frontal lobe, possibly embolic secondary to unknown source.  Recommend completion of TEE outpatient.  Hypercoagulable  labs pending at discharge.  Recommended DAPT for 3 weeks and aspirin alone. LDL 101 and initiated Crestor 10 mg daily.  A1c 5.1.  Other stroke risk factors include current tobacco use, obesity and history of migraines but no prior stroke history.  Other active problems include suspected sleep apnea, bradycardia and hypokalemia.  Evaluated by therapies and recommended outpatient PT and discharged home in stable condition.  Stroke: Small acute infarct in the juxtacortical high right posterior frontal lobe possibly embolic - etiology unknown.  Resultant mild left leg weakness and numbness which is resolving  Code Stroke CT Head -  Normal head CT. ASPECTS is 10.      CT head - not ordered  MRI head - Small acute infarct in the juxtacortical high right posterior frontal lobe. Minimal edema without mass effect. Additional mild scattered T2/FLAIR hyperintensities within the white matter, mildly advanced for age. These are nonspecific and could relate to chronic microvascular ischemic disease, prior migraines, trauma, demyelination, or inflammation.  CTA H&N - normal  2D Echo - EF 55 - 60%. No cardiac source of emboli identified.   [employer] Virus 2 - negative  LDL - 101  HgbA1c - 5.1  UDS - negative  VTE prophylaxis - Lovenox  No antithrombotic prior to admission, now on aspirin 81 mg daily and clopidogrel 75 mg daily for 3 weeks then aspirin alone  Patient will be counseled to be compliant with her antithrombotic medications  Ongoing aggressive stroke risk factor management  Therapy recommendations:  Outpt PT  Disposition:  Home     ROS:   14 system review of systems performed and negative with exception of those listed in HPI  PMH:  Past Medical History:  Diagnosis Date  . Anxiety   . Bronchitis   . Carpal tunnel syndrome on both sides   . Constipation   . Depression   . Family history of adverse reaction to anesthesia    mother had problems waking up from surgery.  .  Gestational diabetes   . Gestational diabetes mellitus    Normal 2 hr GTT postpartum  . Headache    migraines  . High cholesterol   . Hypertension   . Infertility, female   . Lactose intolerance   . Oligohydramnios antepartum 11/09/2017  . Palpitations   . Panic attack 2017   diagnosed 3 months ago. no meds presently  . Reflux    did not filll prescription  . S/P percutaneous patent foramen ovale closure 06/16/2020   s/p PFO occluder device with a a 25 MM AMPLATZER by Dr. Excell Seltzer  . Stroke (HCC)   . Swelling   . Vitamin D deficiency     PSH:  Past Surgical History:  Procedure Laterality Date  . BREAST BIOPSY  right    cyst  . BUBBLE STUDY  04/16/2020   Procedure: BUBBLE STUDY;  Surgeon: Chilton Si, MD;  Location: [facility] ENDOSCOPY;  Service: Cardiovascular;;  . DILATION AND EVACUATION N/A 07/26/2015   Procedure: DILATATION AND EVACUATION;  Surgeon: Brock Bad, MD;  Location: [facility] ORS;  Service: Gynecology;  Laterality: N/A;  . PATENT FORAMEN OVALE(PFO) CLOSURE N/A 06/16/2020   Procedure: PATENT FORAMEN OVALE (PFO) CLOSURE;  Surgeon: Tonny Bollman, MD;  Location: [facility] INVASIVE CV LAB;  Service: Cardiovascular;  Laterality: N/A;  . TEE WITHOUT CARDIOVERSION N/A 04/16/2020   Procedure: TRANSESOPHAGEAL ECHOCARDIOGRAM (TEE);  Surgeon: Chilton Si,  MD;  Location: [facility] ENDOSCOPY;  Service: Cardiovascular;  Laterality: N/A;    Social History:  Social History   Socioeconomic History  . Marital status: Married    Spouse name: Not on file  . Number of children: Not on file  . Years of education: Not on file  . Highest education level: Not on file  Occupational History  . Occupation: Not Working  Tobacco Use  . Smoking status: Current Some Day Smoker    Packs/day: 0.25    Types: Cigarettes  . Smokeless tobacco: Never Used  . Tobacco comment: 2 cig/day  Vaping Use  . Vaping Use: Never used  Substance and Sexual Activity  . Alcohol use: No    Alcohol/week: 0.0 standard  drinks  . Drug use: No  . Sexual activity: Not Currently  Other Topics Concern  . Not on file  Social History Narrative  . Not on file   Social Determinants of Health   Financial Resource Strain: Not on file  Food Insecurity: Not on file  Transportation Needs: Not on file  Physical Activity: Not on file  Stress: Not on file  Social Connections: Not on file  Intimate Partner Violence: Not on file    Family History:  Family History  Problem Relation Age of Onset  . Hypertension Mother   . Diabetes Maternal Aunt   . Breast cancer Maternal Aunt   . Diabetes Maternal Uncle   . Anxiety disorder Father   . Depression Father   . Alcoholism Father     Medications:   Current Outpatient Medications on File Prior to Visit  Medication Sig Dispense Refill  . aspirin 81 MG EC tablet Take 81 mg by mouth daily. Swallow whole.    Marland Kitchen. buPROPion (WELLBUTRIN) 75 MG tablet Take 1 tablet (75 mg total) by mouth 2 (two) times daily. 180 tablet 1  . clopidogrel (PLAVIX) 75 MG tablet Take 1 tablet (75 mg total) by mouth daily. 90 tablet 1  . eszopiclone (LUNESTA) 2 MG TABS tablet Take 1 tablet (2 mg total) by mouth at bedtime as needed for sleep. Take immediately before bedtime 90 tablet 1  . levonorgestrel (MIRENA, 52 MG,) 20 MCG/24HR IUD 1 Intra Uterine Device (1 each total) by Intrauterine route once for 1 dose. 1 each 0  . Multiple Vitamins-Minerals (MULTIVITAMIN WITH MINERALS) tablet Take 1 tablet by mouth daily.    . Probiotic Product (PROBIOTIC DAILY PO) Take 1 tablet by mouth daily.     . rosuvastatin (CRESTOR) 10 MG tablet Take 1 tablet (10 mg total) by mouth daily. 90 tablet 1  . Ubrogepant (UBRELVY) 100 MG TABS Take 1 tablet by mouth daily as needed. 30 tablet 1  . lisinopril (ZESTRIL) 20 MG tablet Take 1.5 tablets (30 mg total) by mouth daily. 135 tablet 3   No current facility-administered medications on file prior to visit.    Allergies:  No Known  Allergies    OBJECTIVE:  Physical Exam  Vitals:   08/02/20 0736  BP: 135/83  Pulse: 65  Weight: 213 lb (96.6 kg)  Height: 5\' 7"  (1.702 m)   Body mass index is 33.36 kg/m. No exam data present  General: well developed, well nourished,  very pleasant middle-aged African-American female, seated, in no evident distress Head: head normocephalic and atraumatic.   Neck: supple with no carotid or supraclavicular bruits Cardiovascular: regular rate and rhythm, no murmurs Musculoskeletal: no deformity Skin:  no rash/petichiae Vascular:  Normal pulses all extremities   Neurologic Exam  Mental Status: Awake and fully alert.   Fluent speech and language although occasional hesitancy.  Oriented to place and time. Recent memory impaired and remote memory intact. Attention span, concentration and fund of knowledge appropriate during visit but subjectively impaired. Mood and affect appropriate.  Cranial Nerves: Pupils equal, briskly reactive to light. Extraocular movements full without nystagmus. Visual fields full to confrontation. Hearing intact. Facial sensation intact. Face, tongue, palate moves normally and symmetrically.  Motor: Normal bulk and tone. Normal strength in all tested extremity Sensory.: intact to touch , pinprick , position and vibratory sensation.  Coordination: Rapid alternating movements normal in all extremities. Finger-to-nose and heel-to-shin performed accurately bilaterally. Gait and Station: Arises from chair without difficulty. Stance is normal. Gait demonstrates  normal stride length and balance without use of assistive device.  Tandem walk and heel toe with mild difficulty Reflexes: 1+ and symmetric. Toes downgoing.        ASSESSMENT: Tracey [name] is a 45 y.o. year old female presented with acute onset of neck discomfort, left leg numbness/weakness, left arm numbness and pressure sensation at base of posterior neck midline on 03/02/2021 with stroke work-up  revealing small acute infarct in the juxtacortical high right posterior frontal lobe, possibly embolic secondary to unknown source. Vascular risk factors include HTN, HLD, tobacco use, obesity, migraines and PFO s/p closure 06/16/2020.      PLAN:  1. R frontal lobe stroke, cryptogenic:  a. Residual deficit: Cognitive communication deficit -gradually improving working with SLP but concern of being able to return back to work at this present time due to continued deficits that would likely greatly impact her ability to work at an [employer].  Recommend extending the short-term disability for additional 2 months but is able to return sooner, she was advised to let me know. b. PFO s/p closure 06/16/2020 c. Hypercoagulable labs and 30-day cardiac event monitor negative d. Continue aspirin 81 mg daily  and Crestor 10 mg daily for secondary stroke prevention.   e. Discussed secondary stroke prevention measures and importance of close PCP follow up for aggressive stroke risk factor management  2. PFO: a. As evidenced on TEE 04/16/2020 b. S/p closure 06/16/2020 for ROPE score 5 indicating 34% chance that stroke was due to PFO c. Remains aspirin and Plavix for total of 6 months and aspirin alone and routinely follows with cardiology 3. HTN: BP goal <130/90.  Stable on current regimen per PCP 4. HLD: LDL goal <70. Recent LDL 51 down from 101 on Crestor 10 mg daily managed and prescribed by PCP 5. Depression/anxiety: Routinely followed by PCP    Follow up in 3 months or call earlier if needed   CC:  [facility] provider: Dr. Mechele Collin, Bernadene Bell, MD     Ihor Austin, [facility]  [facility] [address] [place], [place] [address]  Phone [phone] Fax [phone] Note: This document was prepared with digital dictation and possible smart phrase technology. Any transcriptional errors that result from this process are unintentional.

## 2020-08-02 NOTE — Progress Notes (Signed)
I agree with the above plan 

## 2020-08-04 ENCOUNTER — Ambulatory Visit (INDEPENDENT_AMBULATORY_CARE_PROVIDER_SITE_OTHER): Payer: 59 | Admitting: Family Medicine

## 2020-08-04 ENCOUNTER — Other Ambulatory Visit: Payer: Self-pay

## 2020-08-04 ENCOUNTER — Telehealth: Payer: Self-pay | Admitting: *Deleted

## 2020-08-04 ENCOUNTER — Ambulatory Visit: Payer: 59 | Admitting: Speech Pathology

## 2020-08-04 ENCOUNTER — Encounter (INDEPENDENT_AMBULATORY_CARE_PROVIDER_SITE_OTHER): Payer: Self-pay | Admitting: Family Medicine

## 2020-08-04 ENCOUNTER — Encounter: Payer: Self-pay | Admitting: Speech Pathology

## 2020-08-04 VITALS — BP 124/78 | HR 71 | Temp 98.2°F | Ht 67.0 in | Wt 208.0 lb

## 2020-08-04 DIAGNOSIS — E8881 Metabolic syndrome: Secondary | ICD-10-CM

## 2020-08-04 DIAGNOSIS — I10 Essential (primary) hypertension: Secondary | ICD-10-CM | POA: Diagnosis not present

## 2020-08-04 DIAGNOSIS — E7849 Other hyperlipidemia: Secondary | ICD-10-CM

## 2020-08-04 DIAGNOSIS — R41841 Cognitive communication deficit: Secondary | ICD-10-CM

## 2020-08-04 DIAGNOSIS — R4701 Aphasia: Secondary | ICD-10-CM

## 2020-08-04 DIAGNOSIS — Z9189 Other specified personal risk factors, not elsewhere classified: Secondary | ICD-10-CM | POA: Diagnosis not present

## 2020-08-04 DIAGNOSIS — Z6832 Body mass index (BMI) 32.0-32.9, adult: Secondary | ICD-10-CM

## 2020-08-04 DIAGNOSIS — E559 Vitamin D deficiency, unspecified: Secondary | ICD-10-CM | POA: Diagnosis not present

## 2020-08-04 DIAGNOSIS — E669 Obesity, unspecified: Secondary | ICD-10-CM

## 2020-08-04 NOTE — Telephone Encounter (Signed)
-----   Message from Ihor Austin, NP sent at 08/02/2020  8:19 AM EDT ----- Can we please extend her current short-term disability for additional 2 months? Thank you!

## 2020-08-04 NOTE — Therapy (Signed)
[facility] [address] [place], [place], [address] Phone: [phone]   Fax:  [phone]  Speech Language Pathology Treatment  Patient Details  Name: Tracey [name] MRN: [id number] Date of Birth: 03-16-1976 Referring Provider (SLP): Etta Grandchild   Encounter Date: 08/04/2020   End of Session - 08/04/20 1148    Visit Number 13    Number of Visits 25    Date for SLP Re-Evaluation 08/02/19    SLP Start Time 1100    SLP Stop Time  1150    SLP Time Calculation (min) 50 min    Activity Tolerance Patient tolerated treatment well;Patient limited by fatigue           Past Medical History:  Diagnosis Date  . Anxiety   . Bronchitis   . Carpal tunnel syndrome on both sides   . Constipation   . Depression   . Family history of adverse reaction to anesthesia    mother had problems waking up from surgery.  . Gestational diabetes   . Gestational diabetes mellitus    Normal 2 hr GTT postpartum  . Headache    migraines  . High cholesterol   . Hypertension   . Infertility, female   . Lactose intolerance   . Oligohydramnios antepartum 11/09/2017  . Palpitations   . Panic attack 2017   diagnosed 3 months ago. no meds presently  . Reflux    did not filll prescription  . S/P percutaneous patent foramen ovale closure 06/16/2020   s/p PFO occluder device with a a 25 MM AMPLATZER by Dr. Excell Seltzer  . Stroke (HCC)   . Swelling   . Vitamin D deficiency     Past Surgical History:  Procedure Laterality Date  . BREAST BIOPSY  right    cyst  . BUBBLE STUDY  04/16/2020   Procedure: BUBBLE STUDY;  Surgeon: Chilton Si, MD;  Location: [facility] ENDOSCOPY;  Service: Cardiovascular;;  . DILATION AND EVACUATION N/A 07/26/2015   Procedure: DILATATION AND EVACUATION;  Surgeon: Brock Bad, MD;  Location: [facility] ORS;  Service: Gynecology;  Laterality: N/A;  . PATENT FORAMEN OVALE(PFO) CLOSURE N/A 06/16/2020   Procedure: PATENT FORAMEN OVALE  (PFO) CLOSURE;  Surgeon: Tonny Bollman, MD;  Location: [facility] INVASIVE CV LAB;  Service: Cardiovascular;  Laterality: N/A;  . TEE WITHOUT CARDIOVERSION N/A 04/16/2020   Procedure: TRANSESOPHAGEAL ECHOCARDIOGRAM (TEE);  Surgeon: Chilton Si, MD;  Location: [facility] ENDOSCOPY;  Service: Cardiovascular;  Laterality: N/A;    There were no vitals filed for this visit.          ADULT SLP TREATMENT - 08/04/20 1144      General Information   Behavior/Cognition Alert;Cooperative;Pleasant mood      Treatment Provided   Treatment provided Cognitive-Linquistic      Cognitive-Linquistic Treatment   Treatment focused on Cognition    Skilled Treatment Pt and SLP spoke about goals for patient to go back to work. Began to develop a plan for return-to-work. SLP edu patient on importance of continuing organization skills.      Assessment / Recommendations / Plan   Plan Continue with current plan of care      Progression Toward Goals   Progression toward goals Progressing toward goals              SLP Short Term Goals - 08/04/20 1148      SLP SHORT TERM GOAL #1   Title Pt will utilize word finding strategies (of a list)  given a verbal prompt to increase word finding ability.    Period Weeks    Status Achieved      SLP SHORT TERM GOAL #2   Title Pt will recall 2 memory strategies to assist with medication and financial management.    Time 1    Period Weeks    Status Achieved      SLP SHORT TERM GOAL #3   Title Patient will demo attention to detail in school/work-like material with double checking/self-corrections.    Time 1    Period Weeks    Status Achieved      SLP SHORT TERM GOAL #4   Title Patient will complete planning/organization exercises with mod verbal and visual cues.    Time 1    Period Weeks    Status Achieved            SLP Long Term Goals - 08/04/20 1149      SLP LONG TERM GOAL #1   Title Patient will participate in restorative and compensatory therapy to  improve verbal expression by incorporating strategies to communicate wants and needs effectively to different conversational partners, maintain safety and participate socially in their functional living environment.    Time 6    Period Weeks    Status On-going      SLP LONG TERM GOAL #2   Title Patient will demonstrate use of memory strategies to schedule activities, recall weekly events and items to maintain safety to participate socially in functional living environment    Time 6    Period Weeks    Status On-going      SLP LONG TERM GOAL #3   Title Patient will increase information processing speed during planning and organization daily living activities to improve safety and awareness in functional living environment    Time 6    Period Weeks    Status New      SLP LONG TERM GOAL #4   Title Patient will develop functional attention skills to effectively attend to and communicate in tasks of daily living in their functional living environment.    Time 6    Period Weeks    Status On-going            Plan - 08/04/20 1148    Clinical Impression Statement Pt continues to have difficulty with thought organization and managing some day-to-day tasks. She benefited from extra time for processing and step-by-step breakdown of tasks. SLP rec skilled speech srevices to address cognitive communication impairment to achieve participation in all ADLs.    Speech Therapy Frequency 2x / week    Duration 12 weeks    Treatment/Interventions Compensatory strategies;Cueing hierarchy;Functional tasks;Patient/family education;Environmental controls;Cognitive reorganization;Multimodal communcation approach;Language facilitation;Compensatory techniques;Internal/external aids;SLP instruction and feedback    Potential to Achieve Goals Good    Consulted and Agree with Plan of Care Patient           Patient will benefit from skilled therapeutic intervention in order to improve the following deficits and  impairments:   Aphasia  Cognitive communication deficit    Problem List Patient Active Problem List   Diagnosis Date Noted  . Psychophysiological insomnia 07/08/2020  . Chronic migraine without aura without status migrainosus, not intractable 07/08/2020  . S/P percutaneous patent foramen ovale closure 06/16/2020  . Cryptogenic stroke (HCC) - R frontal lobe 05/31/2020  . PFO (patent foramen ovale) - ROPE score 5 05/31/2020  . Hypokalemia 04/03/2020  . Hypertension   . Depression   . Anxiety 11/27/2017  .  Vitamin D deficiency 06/07/2017    Dorena Bodo MS, [place], CBIS  08/04/2020, 11:49 AM  [facility] [address] [place], [place], [address] Phone: [phone]   Fax:  [phone]   Name: Tracey [name] MRN: [id number] Date of Birth: 06/26/1975

## 2020-08-04 NOTE — Telephone Encounter (Signed)
Ihor Austin, NP  Guy Begin, RN; Candi Leash D Can we please extend her current short-term disability for additional 2 months? Retrieved last form and changed the date.  To Shanda Bumps NP for signature.

## 2020-08-05 NOTE — Progress Notes (Signed)
Chief Complaint:   OBESITY Tracey [name] is here to discuss her progress with her obesity treatment plan along with follow-up of her obesity related diagnoses. Tracey [name] is on the Category 3 Plan and states she is following her eating plan approximately 80% of the time. Tracey [name] states she is walking for 60 minutes 2 times per week.  Today's visit was #: 2 Starting weight: 209 lbs Starting date: 07/21/2020 Today's weight: 208 lbs Today's date: 08/04/2020 Total lbs lost to date: 1 Total lbs lost since last in-office visit: 1  Interim History: Tracey [name] struggled to eat everything on her plan, especially at breakfast. She did well with meal planning and prepping, but she would like more options in the morning.   Subjective:   1. Other hyperlipidemia Tracey [name] is on Crestor and her labs are good, but her HDL is below goal. She is working on diet and exercise. I discussed labs with the patient today.  2. Essential hypertension Tracey [name] is on Zestril, and her blood pressure is stable and K+ is stable. She denies signs of hypotension. Her creatinine is very mildly elevated, but likely due to ACE. I discussed labs with the patient today.  3. Vitamin D deficiency Tracey [name]'s Vit D level is at goal, and she denies signs of over-replacement. I discussed labs with the patient today.  4. Insulin resistance Tracey [name] has a new diagnosis of insulin resistance. Her glucose and A1c are within normal limits, but her fasting insulin is >5. She notes PM polyphagia and AM anorexia which is standard for insulin resistance. She has a family history of diabetes mellitus.  5. At risk for diabetes mellitus Tracey [name] is at higher than average risk for developing diabetes due to obesity.   Assessment/Plan:   1. Other hyperlipidemia Cardiovascular risk and specific lipid/LDL goals reviewed. We discussed several lifestyle modifications today. Tracey [name] will continue Crestor, and will continue to work on diet, exercise and weight loss efforts.  Orders and follow up as documented in patient record.   2. Essential hypertension Tracey [name] will continue diet, exercise, and weight loss to improve blood pressure control. We will watch for signs of hypotension as she continues her lifestyle modifications.  3. Vitamin D deficiency Low Vitamin D level contributes to fatigue and are associated with obesity, breast, and colon cancer. Tracey [name] agreed to continue taking OTC Vitamin D in multivitamins and will follow-up for routine testing of Vitamin D, at least 2-3 times per year to avoid over-replacement.  4. Insulin resistance Tracey [name] will continue decreasing simple carbohydrates and increasing protein to help decrease the risk of diabetes. Tracey [name] agreed to follow-up with [place] as directed to closely monitor her progress. We will recheck labs in 3 months.  5. At risk for diabetes mellitus Tracey [name] was given approximately 30 minutes of diabetes education and counseling today. We discussed intensive lifestyle modifications today with an emphasis on weight loss as well as increasing exercise and decreasing simple carbohydrates in her diet. We also reviewed medication options with an emphasis on risk versus benefit of those discussed.   Repetitive spaced learning was employed today to elicit superior memory formation and behavioral change.  6. Obesity with current BMI 32.6 Tracey [name] is currently in the action stage of change. As such, her goal is to continue with weight loss efforts. She has agreed to change to the Category 3 Plan and keeping a food journal and adhering to recommended goals of 300-400 calories and 20+ grams of protein at breakfast daily.   Exercise goals: As is.  Behavioral modification strategies: increasing lean protein intake.  Tracey [name] has agreed to follow-up with our clinic in 2 weeks. She was informed of the importance of frequent follow-up visits to maximize her success with intensive lifestyle modifications for her multiple health conditions.    Objective:   Blood pressure 124/78, pulse 71, temperature 98.2 F (36.8 C), height 5\' 7"  (1.702 m), weight 208 lb (94.3 kg), SpO2 99 %. Body mass index is 32.58 kg/m.  General: Cooperative, alert, well developed, in no acute distress. HEENT: Conjunctivae and lids unremarkable. Cardiovascular: Regular rhythm.  Lungs: Normal work of breathing. Neurologic: No focal deficits.   Lab Results  Component Value Date   CREATININE 1.04 (H) 07/21/2020   BUN 9 07/21/2020   NA 140 07/21/2020   K 4.0 07/21/2020   CL 105 07/21/2020   CO2 21 07/21/2020   Lab Results  Component Value Date   ALT 15 07/21/2020   AST 13 07/21/2020   ALKPHOS 59 07/21/2020   BILITOT 0.5 07/21/2020   Lab Results  Component Value Date   HGBA1C 5.5 07/21/2020   HGBA1C 5.1 04/03/2020   HGBA1C 5.1 11/10/2017   HGBA1C 5.6 06/04/2017   Lab Results  Component Value Date   INSULIN 16.6 07/21/2020   Lab Results  Component Value Date   TSH 1.130 07/21/2020   Lab Results  Component Value Date   CHOL 102 07/21/2020   HDL 40 07/21/2020   LDLCALC 51 07/21/2020   TRIG 40 07/21/2020   CHOLHDL 4.0 04/03/2020   Lab Results  Component Value Date   WBC 6.5 07/21/2020   HGB 13.9 07/21/2020   HCT 43.0 07/21/2020   MCV 89 07/21/2020   PLT 279 07/21/2020   No results found for: IRON, TIBC, FERRITIN  Attestation Statements:   Reviewed by clinician on day of visit: allergies, medications, problem list, medical history, surgical history, family history, social history, and previous encounter notes.   I, 09/20/2020, am acting as transcriptionist for Burt Knack, MD.  I have reviewed the above documentation for accuracy and completeness, and I agree with the above. -  Quillian Quince, MD

## 2020-08-10 NOTE — Telephone Encounter (Signed)
Completed and present health box.  Thank you.

## 2020-08-10 NOTE — Telephone Encounter (Signed)
addended copy to medical records.

## 2020-08-11 ENCOUNTER — Ambulatory Visit: Payer: 59 | Admitting: Speech Pathology

## 2020-08-11 ENCOUNTER — Encounter: Payer: Self-pay | Admitting: Speech Pathology

## 2020-08-11 ENCOUNTER — Telehealth: Payer: Self-pay | Admitting: Neurology

## 2020-08-11 ENCOUNTER — Other Ambulatory Visit: Payer: Self-pay

## 2020-08-11 DIAGNOSIS — R4701 Aphasia: Secondary | ICD-10-CM

## 2020-08-11 DIAGNOSIS — R41841 Cognitive communication deficit: Secondary | ICD-10-CM | POA: Diagnosis not present

## 2020-08-11 NOTE — Telephone Encounter (Signed)
Faxed form to [employer] 5.25.2022

## 2020-08-11 NOTE — Therapy (Signed)
[facility]. [place], [place], [address] Phone: [phone]   Fax:  [phone]  Speech Language Pathology Treatment  Patient Details  Name: Tracey [name] MRN: [id number] Date of Birth: 07-01-1975 Referring Provider (SLP): Janith Lima   Encounter Date: 08/11/2020   End of Session - 08/11/20 1109    Visit Number 14    Number of Visits --    Date for SLP Re-Evaluation 09/11/20    Authorization Time Period 08/11/20-09/11/20    Authorization - Visit Number 1    Authorization - Number of Visits 8    SLP Start Time 1020    SLP Stop Time  1100    SLP Time Calculation (min) 40 min    Activity Tolerance Patient tolerated treatment well;Patient limited by fatigue           Past Medical History:  Diagnosis Date  . Anxiety   . Bronchitis   . Carpal tunnel syndrome on both sides   . Constipation   . Depression   . Family history of adverse reaction to anesthesia    mother had problems waking up from surgery.  . Gestational diabetes   . Gestational diabetes mellitus    Normal 2 hr GTT postpartum  . Headache    migraines  . High cholesterol   . Hypertension   . Infertility, female   . Lactose intolerance   . Oligohydramnios antepartum 11/09/2017  . Palpitations   . Panic attack 2017   diagnosed 3 months ago. no meds presently  . Reflux    did not filll prescription  . S/P percutaneous patent foramen ovale closure 06/16/2020   s/p PFO occluder device with a a 25 MM AMPLATZER by Dr. Burt Knack  . Stroke ([place])   . Swelling   . Vitamin D deficiency     Past Surgical History:  Procedure Laterality Date  . BREAST BIOPSY  right    cyst  . BUBBLE STUDY  04/16/2020   Procedure: BUBBLE STUDY;  Surgeon: Skeet Latch, MD;  Location: [facility];  Service: Cardiovascular;;  . DILATION AND EVACUATION N/A 07/26/2015   Procedure: DILATATION AND EVACUATION;  Surgeon: Shelly Bombard, MD;  Location: [facility];  Service:  Gynecology;  Laterality: N/A;  . PATENT FORAMEN OVALE(PFO) CLOSURE N/A 06/16/2020   Procedure: PATENT FORAMEN OVALE (PFO) CLOSURE;  Surgeon: Sherren Mocha, MD;  Location: [facility];  Service: Cardiovascular;  Laterality: N/A;  . TEE WITHOUT CARDIOVERSION N/A 04/16/2020   Procedure: TRANSESOPHAGEAL ECHOCARDIOGRAM (TEE);  Surgeon: Skeet Latch, MD;  Location: [facility];  Service: Cardiovascular;  Laterality: N/A;    There were no vitals filed for this visit.          ADULT SLP TREATMENT - 08/11/20 1120      General Information   Behavior/Cognition Alert;Cooperative;Pleasant mood      Treatment Provided   Treatment provided Cognitive-Linquistic      Cognitive-Linquistic Treatment   Treatment focused on Cognition    Skilled Treatment Facilitated word finding and thought organization in conversation while pt was describing a specific hair process. Pt required verbal cueing to remain on topic vs. switching between topics and not completing ideas.      Assessment / Recommendations / Plan   Plan Continue with current plan of care      Progression Toward Goals   Progression toward goals Progressing toward goals              SLP Short  Term Goals - 08/11/20 1125      SLP SHORT TERM GOAL #1   Title Pt will demonstrate use of organization strategies during a given task with the goal to return to work.    Time 2    Period Weeks    Status New      SLP SHORT TERM GOAL #2   Title --    Time --    Period --    Status --      SLP SHORT TERM GOAL #3   Title --    Time --    Period --    Status --      SLP SHORT TERM GOAL #4   Title --    Time --    Period --    Status --            SLP Long Term Goals - 08/11/20 1129      SLP LONG TERM GOAL #1   Title Patient will participate in restorative and compensatory therapy to improve verbal expression by incorporating strategies to communicate wants and needs effectively to different conversational  partners, maintain safety and participate socially in their functional living environment.    Time 6    Period Weeks    Status Achieved      SLP LONG TERM GOAL #2   Title Patient will demonstrate use of memory strategies to schedule activities, recall weekly events and items to maintain safety to participate socially in functional living environment    Time 6    Period Weeks    Status Achieved      SLP LONG TERM GOAL #3   Title Patient will increase information processing speed during planning and organization daily living activities to improve safety and awareness in functional living environment    Time 6    Period Weeks    Status Revised      SLP LONG TERM GOAL #4   Title Patient will develop functional attention skills to effectively attend to and communicate in tasks of daily living in their functional living environment.    Time 6    Period Weeks    Status Achieved      SLP LONG TERM GOAL #5   Title Patient will report succesful use of organization and/or attention strategies at home/work.    Time 4    Period Weeks    Status New            Plan - 08/11/20 1122    Clinical Impression Statement Reportedly, pt is wanting to return to work in June. SLP and pt brainstormed different plans for transitioning back to work (i.e. shadowing someone in her job position, going through new-hire orientation, transitioning to part-time vs. full-time).  SLP rec skilled speech srevices to address cognitive communication impairment to achieve participation in all ADLs.    Speech Therapy Frequency 2x / week    Duration 12 weeks    Treatment/Interventions Compensatory strategies;Cueing hierarchy;Functional tasks;Patient/family education;Environmental controls;Cognitive reorganization;Multimodal communcation approach;Language facilitation;Compensatory techniques;Internal/external aids;SLP instruction and feedback    Potential to Achieve Goals Good    Consulted and Agree with Plan of Care  Patient           Patient will benefit from skilled therapeutic intervention in order to improve the following deficits and impairments:   Cognitive communication deficit  Aphasia    Problem List Patient Active Problem List   Diagnosis Date Noted  . Psychophysiological insomnia 07/08/2020  . Chronic migraine without aura  without status migrainosus, not intractable 07/08/2020  . S/P percutaneous patent foramen ovale closure 06/16/2020  . Cryptogenic stroke ([place]) - R frontal lobe 05/31/2020  . PFO (patent foramen ovale) - ROPE score 5 05/31/2020  . Hypokalemia 04/03/2020  . Hypertension   . Depression   . Anxiety 11/27/2017  . Vitamin D deficiency 06/07/2017   Speech Therapy Recertification Note  Date for Re-evaluation: 09/11/20  Objective Reports of Subjective Statement: Pt continues to make progress towards her goals. She has met several goals and is able to communicate her wants/needs effectively. Continues to have occasional impairments in thought organization and word finding, but is able to use strategies to assist.   Objective Measurements: See tx note.  Goal Update: See tx note.  Plan: Continue speech therapy services with the intention of achieving pt's goal of return to work.   Reason Skilled Services are Required: SLP rec continued speech therapy services to assist pt in navigating organization impairment and return to work process.   Winchester, Edie, CBIS  08/11/2020, 11:33 AM  [facility]. [place], [place], [address] Phone: [phone]   Fax:  [phone]   Name: Tracey [name] MRN: [id number] Date of Birth: 1975/11/21

## 2020-08-18 ENCOUNTER — Ambulatory Visit: Payer: 59 | Admitting: Speech Pathology

## 2020-08-19 ENCOUNTER — Other Ambulatory Visit: Payer: Self-pay

## 2020-08-19 ENCOUNTER — Ambulatory Visit (INDEPENDENT_AMBULATORY_CARE_PROVIDER_SITE_OTHER): Payer: 59 | Admitting: Family Medicine

## 2020-08-19 VITALS — BP 120/80 | HR 87 | Temp 98.1°F | Ht 67.0 in | Wt 206.0 lb

## 2020-08-19 DIAGNOSIS — Z6832 Body mass index (BMI) 32.0-32.9, adult: Secondary | ICD-10-CM

## 2020-08-19 DIAGNOSIS — Z9189 Other specified personal risk factors, not elsewhere classified: Secondary | ICD-10-CM | POA: Diagnosis not present

## 2020-08-19 DIAGNOSIS — F3289 Other specified depressive episodes: Secondary | ICD-10-CM | POA: Diagnosis not present

## 2020-08-19 DIAGNOSIS — E669 Obesity, unspecified: Secondary | ICD-10-CM

## 2020-08-19 MED ORDER — BUPROPION HCL 75 MG PO TABS: 75.0000 mg | ORAL_TABLET | Freq: Two times a day (BID) | ORAL | 0 refills | Status: DC

## 2020-08-24 NOTE — Progress Notes (Signed)
Chief Complaint:   OBESITY Tracey [name] is here to discuss her progress with her obesity treatment plan along with follow-up of her obesity related diagnoses. Tracey [name] is on the Category 3 Plan and keeping a food journal and adhering to recommended goals of 300-400 calories and 20+ grams of protein at breakfast daily and states she is following her eating plan approximately 95% of the time. Tracey [name] states she is doing 0 minutes 0 times per week.  Today's visit was #: 3 Starting weight: 209 lbs Starting date: 07/21/2020 Today's weight: 206 lbs Today's date: 08/19/2020 Total lbs lost to date: 3 Total lbs lost since last in-office visit: 2  Interim History: Tracey [name] has done well with weight loss on her Category 3 plan. She is doing well with meal planning and other members of her family are trying to eat healthy with her.  Subjective:   1. Other depression with emotional eating Tracey [name] is doing well on bupropion and she notes decreased emotional eating behaviors. Her blood pressure is stable, and no side effects noted.  2. At risk for diabetes mellitus Tracey [name] is at higher than average risk for developing diabetes due to obesity.   Assessment/Plan:   1. Other depression with emotional eating Behavior modification techniques were discussed today to help [name] deal with her emotional/non-hunger eating behaviors. We will refill Wellbutrin for 1 month. Orders and follow up as documented in patient record.   - buPROPion (WELLBUTRIN) 75 MG tablet; Take 1 tablet (75 mg total) by mouth 2 (two) times daily.  Dispense: 60 tablet; Refill: 0  2. At risk for diabetes mellitus Tracey [name] was given approximately 15 minutes of diabetes education and counseling today. We discussed intensive lifestyle modifications today with an emphasis on weight loss as well as increasing exercise and decreasing simple carbohydrates in her diet. We also reviewed medication options with an emphasis on risk versus benefit of those discussed.    Repetitive spaced learning was employed today to elicit superior memory formation and behavioral change.  3. Obesity with current BMI 32.3 Tracey [name] is currently in the action stage of change. As such, her goal is to continue with weight loss efforts. She has agreed to the Category 3 Plan and keeping a food journal and adhering to recommended goals of 300-400 calories and 20+ grams of protein at breakfast daily.   Behavioral modification strategies: meal planning and cooking strategies, emotional eating strategies and dealing with family or coworker support.  Tracey [name] has agreed to follow-up with our clinic in 2 to 3 weeks. She was informed of the importance of frequent follow-up visits to maximize her success with intensive lifestyle modifications for her multiple health conditions.   Objective:   Blood pressure 120/80, pulse 87, temperature 98.1 F (36.7 C), height 5\' 7"  (1.702 m), weight 206 lb (93.4 kg), SpO2 97 %. Body mass index is 32.26 kg/m.  General: Cooperative, alert, well developed, in no acute distress. HEENT: Conjunctivae and lids unremarkable. Cardiovascular: Regular rhythm.  Lungs: Normal work of breathing. Neurologic: No focal deficits.   Lab Results  Component Value Date   CREATININE 1.04 (H) 07/21/2020   BUN 9 07/21/2020   NA 140 07/21/2020   K 4.0 07/21/2020   CL 105 07/21/2020   CO2 21 07/21/2020   Lab Results  Component Value Date   ALT 15 07/21/2020   AST 13 07/21/2020   ALKPHOS 59 07/21/2020   BILITOT 0.5 07/21/2020   Lab Results  Component Value Date   HGBA1C 5.5 07/21/2020  HGBA1C 5.1 04/03/2020   HGBA1C 5.1 11/10/2017   HGBA1C 5.6 06/04/2017   Lab Results  Component Value Date   INSULIN 16.6 07/21/2020   Lab Results  Component Value Date   TSH 1.130 07/21/2020   Lab Results  Component Value Date   CHOL 102 07/21/2020   HDL 40 07/21/2020   LDLCALC 51 07/21/2020   TRIG 40 07/21/2020   CHOLHDL 4.0 04/03/2020   Lab Results  Component  Value Date   WBC 6.5 07/21/2020   HGB 13.9 07/21/2020   HCT 43.0 07/21/2020   MCV 89 07/21/2020   PLT 279 07/21/2020   No results found for: IRON, TIBC, FERRITIN  Attestation Statements:   Reviewed by clinician on day of visit: allergies, medications, problem list, medical history, surgical history, family history, social history, and previous encounter notes.   I, Burt Knack, am acting as transcriptionist for Quillian Quince, MD.  I have reviewed the above documentation for accuracy and completeness, and I agree with the above. -  Quillian Quince, MD

## 2020-08-24 NOTE — Telephone Encounter (Signed)
At 1:37 Kristen from [facility]  Left vm  Asking for a call re: updated provider statement.  She is wanting to discuss pt functional limitations  Requiring an extension.  Baxter Hire stated there are no office visit notes, or current testing treatment plan.  Baxter Hire can be called @ [phone] vm) if anything needs faxing please use [phone]

## 2020-08-25 ENCOUNTER — Encounter: Payer: Self-pay | Admitting: Neurology

## 2020-08-25 ENCOUNTER — Ambulatory Visit (INDEPENDENT_AMBULATORY_CARE_PROVIDER_SITE_OTHER): Payer: 59 | Admitting: Neurology

## 2020-08-25 ENCOUNTER — Ambulatory Visit: Payer: 59 | Attending: Internal Medicine | Admitting: Speech Pathology

## 2020-08-25 VITALS — BP 132/90 | HR 63 | Ht 67.0 in | Wt 213.0 lb

## 2020-08-25 DIAGNOSIS — G43709 Chronic migraine without aura, not intractable, without status migrainosus: Secondary | ICD-10-CM

## 2020-08-25 DIAGNOSIS — H538 Other visual disturbances: Secondary | ICD-10-CM

## 2020-08-25 DIAGNOSIS — Q2112 Patent foramen ovale: Secondary | ICD-10-CM

## 2020-08-25 DIAGNOSIS — G4709 Other insomnia: Secondary | ICD-10-CM

## 2020-08-25 DIAGNOSIS — I639 Cerebral infarction, unspecified: Secondary | ICD-10-CM

## 2020-08-25 DIAGNOSIS — Z8774 Personal history of (corrected) congenital malformations of heart and circulatory system: Secondary | ICD-10-CM | POA: Diagnosis not present

## 2020-08-25 DIAGNOSIS — Q211 Atrial septal defect: Secondary | ICD-10-CM | POA: Diagnosis not present

## 2020-08-25 DIAGNOSIS — R41842 Visuospatial deficit: Secondary | ICD-10-CM

## 2020-08-25 MED ORDER — DONEPEZIL HCL 5 MG PO TABS: 5.0000 mg | ORAL_TABLET | Freq: Every day | ORAL | 0 refills | Status: DC

## 2020-08-25 NOTE — Telephone Encounter (Signed)
Called Bowman at [facility].  Left message for her that did fax over last note of when patient was seen here 08/02/2020.  States per Shanda Bumps due to residual deficits to extend disability for 2 months.  Another form with additional questions came yesterday to be completed Shanda Bumps is out of the office until Monday.  She is to call back if questions.

## 2020-08-25 NOTE — Patient Instructions (Signed)
1)  No sleep apnea found, in spite of risk factors - obesity, smoking and observed snoring. A large airway and neck.  2)   Insomnia improved under Lunesta - 2 mg, lets try 1 mg next- and only on workdays.  3)  Continue weight and wellness. Smoking cessation, and weight loss. 4)  Talk to gynecologist about Mirena being a possible stroke risk factor.  5) memory impaired- she has visual spatial deficits, judging distance , interpreting a clock face, and also procedural memory.   Referral to cognitive testing and ophthalmology for visual fields.  Consider low dose aricept for brain booster.     I would like to thank Tiffany Corning,MD , Delia Heady, MD  and Etta Grandchild, Md [address] [place],  [place] [address] , for allowing me to meet with and to take care of this pleasant patient.      Donepezil tablets What is this medicine? DONEPEZIL (doe NEP e zil) is used to treat mild to moderate dementia caused by Alzheimer's disease. This medicine may be used for other purposes; ask your health care provider or pharmacist if you have questions. COMMON BRAND NAME(S): Aricept What should I tell my health care provider before I take this medicine? They need to know if you have any of these conditions:  asthma or other lung disease  difficulty passing urine  head injury  heart disease  history of irregular heartbeat  liver disease  seizures (convulsions)  stomach or intestinal disease, ulcers or stomach bleeding  an unusual or allergic reaction to donepezil, other medicines, foods, dyes, or preservatives  pregnant or trying to get pregnant  breast-feeding How should I use this medicine? Take this medicine by mouth with a glass of water. Follow the directions on the prescription label. You may take this medicine with or without food. Take this medicine at regular intervals. This medicine is usually taken before bedtime. Do not take it more often than directed. Continue to  take your medicine even if you feel better. Do not stop taking except on your doctor's advice. If you are taking the 23 mg donepezil tablet, swallow it whole; do not cut, crush, or chew it. Talk to your pediatrician regarding the use of this medicine in children. Special care may be needed. Overdosage: If you think you have taken too much of this medicine contact a poison control center or emergency room at once. NOTE: This medicine is only for you. Do not share this medicine with others. What if I miss a dose? If you miss a dose, take it as soon as you can. If it is almost time for your next dose, take only that dose, do not take double or extra doses. What may interact with this medicine? Do not take this medicine with any of the following medications:  certain medicines for fungal infections like itraconazole, fluconazole, posaconazole, and voriconazole  cisapride  dextromethorphan; quinidine  dronedarone  pimozide  quinidine  thioridazine This medicine may also interact with the following medications:  antihistamines for allergy, cough and cold  atropine  bethanechol  carbamazepine  certain medicines for bladder problems like oxybutynin, tolterodine  certain medicines for Parkinson's disease like benztropine, trihexyphenidyl  certain medicines for stomach problems like dicyclomine, hyoscyamine  certain medicines for travel sickness like scopolamine  dexamethasone  dofetilide  ipratropium  NSAIDs, medicines for pain and inflammation, like ibuprofen or naproxen  other medicines for Alzheimer's disease  other medicines that prolong the QT interval (cause an abnormal  heart rhythm)  phenobarbital  phenytoin  rifampin, rifabutin or rifapentine  ziprasidone This list may not describe all possible interactions. Give your health care provider a list of all the medicines, herbs, non-prescription drugs, or dietary supplements you use. Also tell them if you smoke,  drink alcohol, or use illegal drugs. Some items may interact with your medicine. What should I watch for while using this medicine? Visit your doctor or health care professional for regular checks on your progress. Check with your doctor or health care professional if your symptoms do not get better or if they get worse. You may get drowsy or dizzy. Do not drive, use machinery, or do anything that needs mental alertness until you know how this drug affects you. What side effects may I notice from receiving this medicine? Side effects that you should report to your doctor or health care professional as soon as possible:  allergic reactions like skin rash, itching or hives, swelling of the face, lips, or tongue  feeling faint or lightheaded, falls  loss of bladder control  seizures  signs and symptoms of a dangerous change in heartbeat or heart rhythm like chest pain; dizziness; fast or irregular heartbeat; palpitations; feeling faint or lightheaded, falls; breathing problems  signs and symptoms of infection like fever or chills; cough; sore throat; pain or trouble passing urine  signs and symptoms of liver injury like dark yellow or brown urine; general ill feeling or flu-like symptoms; light-colored stools; loss of appetite; nausea; right upper belly pain; unusually weak or tired; yellowing of the eyes or skin  slow heartbeat or palpitations  unusual bleeding or bruising  vomiting Side effects that usually do not require medical attention (report to your doctor or health care professional if they continue or are bothersome):  diarrhea, especially when starting treatment  headache  loss of appetite  muscle cramps  nausea  stomach upset This list may not describe all possible side effects. Call your doctor for medical advice about side effects. You may report side effects to FDA at 1-800-FDA-1088. Where should I keep my medicine? Keep out of reach of children. Store at room  temperature between 15 and 30 degrees C (59 and 86 degrees F). Throw away any unused medicine after the expiration date. NOTE: This sheet is a summary. It may not cover all possible information. If you have questions about this medicine, talk to your doctor, pharmacist, or health care provider.  2021 Elsevier/Gold Standard (2018-02-25 10:33:41) 4)  Talk to gynecologist about Mirena being a possible stroke risk factor.  5) memory impaired- she has visual spatial deficits, judging distance , interpreting a clock face, and also procedural memory.   Referral to cognitive testing and ophthalmology for visual fields.  Consider low dose aricept for brain booster.     I would like to thank Tiffany [place],MD , Delia Heady, MD  and Etta Grandchild, Md [address] [place],  [place] [address] , for allowing me to meet with and to take care of this pleasant patient.

## 2020-08-25 NOTE — Telephone Encounter (Signed)
Completed f/u p/w from [place].  To JM/NP for review and completion.

## 2020-08-25 NOTE — Progress Notes (Signed)
SLEEP MEDICINE CLINIC    Provider:  Melvyn Novas, MD  Primary Care Physician:  Tracey Grandchild, MD [address] [place] [address]     Referring Provider: Etta Grandchild, Md [address] [place],  [place] [address]          Chief Complaint according to patient   Patient presents with:    . Patient is here to follow-up of her insomnia issues. She states she only gets about 3 hours of sleep per night. She took [place] (prescribed by Dr [name]) and if she takes it she sleeps much better. Even sometimes when she takes it she is still up but once she gets to sleep she feels she is resting good. She sleeps with her two year old and her husband. She denies headaches but states she is tired during the day and generally tries to take a nap if she can.           HISTORY OF PRESENT ILLNESS:  Tracey [name] is a 45 - year- old African American female cryptogenic Stroke patient here for a RV after sleep study.  The patient has been seen in consult and followed up with STROKE ,MD,  and seen upon referral from PCP and cardiology, she is followed by the stroke team, on 08/25/2020. She is a mother of a 28 year-old, and she has a Mirena IUD. She was diagnosed with insulin resistance in the meantime, [facility], and she has been sleeping better.  Was given Lunesta ( Dr. [name]) which is putting her-out.   1. Delayed sleep onset - and associated frequent arousals from  sleep that appeared spontaneous- unrelated to changes in the  physiological channels. Arousals out of REM sleep were causes of  prolonged periods of wakefulness.  2. Unfortunately, only 3.5 hours of sleep were recorded. However,  all sleep stages were captured.  The patient entered sustained sleep at 1 AM and was woken at 5  AM.    We now know there is no sleep apnea but there is difficulty to initiate sleep and staying asleep. The Lunesta has helped at 2 mg, medium dose.      Tracey [name]   has a past medical history of Anxiety, Bronchitis, Carpal tunnel syndrome on both sides, Constipation, Depression, Family history of adverse reaction to anesthesia, Gestational diabetes, Gestational diabetes mellitus, Headache, High cholesterol, Hypertension, Infertility, female, Lactose intolerance, Oligohydramnios antepartum (11/09/2017), Palpitations, Panic attack (2017), Reflux, S/P percutaneous patent foramen ovale closure (06/16/2020), Stroke (HCC), Swelling, and Vitamin D deficiency. She had a cryptogenic stroke, she is a smoker and she is obese. She snores. She has insomnia with anxiety, She has nocturnal palpitations. She wakes gasping for air. She was a sleep walker in childhood.  Family medical /sleep history: No other family member on CPAP with OSA.    Social history: Patient is working as Curator  and lives in a household with spouse and 68.57 year-old baby and 21 year old , there is a 67 year old no longer living at home. The patient currently works regular office hours. Pets- one dog.  Tobacco use; smoking, cigarettes, reduced on Wellbutrin-.which also reduced her appetite.  ETOH use - socially ; Caffeine intake in form of Coffee( 2-3 a day) Soda(  Reduced now 1 a day), Tea ( /) or energy drinks. Regular exercise in form of walker.   Sleep habits are as follows: The patient's dinner time  is between 5-6 PM. The patient goes to bed at 12 PM and struggles to go to sleep- continues to sleep for intervals of 2 hours.   The preferred sleep position is left sided, with the support of 2-4 pillows. GERD. Dreams are reportedly frequent/vivid- on wellbutrin.     6.30 AM is the usual rise time. The patient wakes up spontaneously.   She reports not feeling refreshed or restored in AM, with symptom of residual fatigue.  Naps are taken recently more frequently, lasting from 1-2 hours- while not yet working again-  and are more refreshing than nocturnal sleep.    Review of Systems: Out of a  complete 14 system review, the patient complains of only the following symptoms, and all other reviewed systems are negative.:  Fatigue, sleepiness , snoring, fragmented sleep, Insomnia , GERD, palpitation - no nocturia.   Right brain stroke.   How likely are you to doze in the following situations: 0 = not likely, 1 = slight chance, 2 = moderate chance, 3 = high chance   Sitting and Reading? Watching Television? Sitting inactive in a public place (theater or meeting)? As a passenger in a car for an hour without a break? Lying down in the afternoon when circumstances permit? Sitting and talking to someone? Sitting quietly after lunch without alcohol? In a car, while stopped for a few minutes in traffic?   Total = 11 with 2 power-naps, if she can't nap her Epworth score is   14 - / 24 points   FSS endorsed at 21/ 63 points.   Social History   Socioeconomic History  . Marital status: Married    Spouse name: Not on file  . Number of children: Not on file  . Years of education: Not on file  . Highest education level: Not on file  Occupational History  . Occupation: Not Working  Tobacco Use  . Smoking status: Current Some Day Smoker    Packs/day: 0.25    Types: Cigarettes  . Smokeless tobacco: Never Used  . Tobacco comment: 2 cig/day  Vaping Use  . Vaping Use: Never used  Substance and Sexual Activity  . Alcohol use: No    Alcohol/week: 0.0 standard drinks  . Drug use: No  . Sexual activity: Not Currently  Other Topics Concern  . Not on file  Social History Narrative   Lives at home with spouse and her 2 children   Right handed   Caffeine: 2 cups/day   Social Determinants of Health   Financial Resource Strain: Not on file  Food Insecurity: Not on file  Transportation Needs: Not on file  Physical Activity: Not on file  Stress: Not on file  Social Connections: Not on file    Family History  Problem Relation Age of Onset  . Hypertension Mother   . Diabetes  Maternal Aunt   . Breast cancer Maternal Aunt   . Diabetes Maternal Uncle   . Anxiety disorder Father   . Depression Father   . Alcoholism Father     Past Medical History:  Diagnosis Date  . Anxiety   . Bronchitis   . Carpal tunnel syndrome on both sides   . Constipation   . Depression   . Family history of adverse reaction to anesthesia    mother had problems waking up from surgery.  . Gestational diabetes   . Gestational diabetes mellitus    Normal 2 hr GTT postpartum  . Headache    migraines  .  High cholesterol   . Hypertension   . Infertility, female   . Lactose intolerance   . Oligohydramnios antepartum 11/09/2017  . Palpitations   . Panic attack 2017   diagnosed 3 months ago. no meds presently  . Reflux    did not filll prescription  . S/P percutaneous patent foramen ovale closure 06/16/2020   s/p PFO occluder device with a a 25 MM AMPLATZER by Dr. Excell Seltzer  . Stroke (HCC)   . Swelling   . Vitamin D deficiency     Past Surgical History:  Procedure Laterality Date  . BREAST BIOPSY  right    cyst  . BUBBLE STUDY  04/16/2020   Procedure: BUBBLE STUDY;  Surgeon: Chilton Si, MD;  Location: [facility] ENDOSCOPY;  Service: Cardiovascular;;  . DILATION AND EVACUATION N/A 07/26/2015   Procedure: DILATATION AND EVACUATION;  Surgeon: Brock Bad, MD;  Location: [facility] ORS;  Service: Gynecology;  Laterality: N/A;  . PATENT FORAMEN OVALE(PFO) CLOSURE N/A 06/16/2020   Procedure: PATENT FORAMEN OVALE (PFO) CLOSURE;  Surgeon: Tonny Bollman, MD;  Location: [facility] INVASIVE CV LAB;  Service: Cardiovascular;  Laterality: N/A;  . TEE WITHOUT CARDIOVERSION N/A 04/16/2020   Procedure: TRANSESOPHAGEAL ECHOCARDIOGRAM (TEE);  Surgeon: Chilton Si, MD;  Location: [facility] ENDOSCOPY;  Service: Cardiovascular;  Laterality: N/A;     Current Outpatient Medications on File Prior to Visit  Medication Sig Dispense Refill  . aspirin 81 MG EC tablet Take 81 mg by mouth daily. Swallow whole.    [place] [facility]  buPROPion (WELLBUTRIN) 75 MG tablet Take 1 tablet (75 mg total) by mouth 2 (two) times daily. 60 tablet 0  . clopidogrel (PLAVIX) 75 MG tablet Take 1 tablet (75 mg total) by mouth daily. 90 tablet 1  . eszopiclone (LUNESTA) 2 MG TABS tablet Take 1 tablet (2 mg total) by mouth at bedtime as needed for sleep. Take immediately before bedtime 90 tablet 1  . levonorgestrel (MIRENA, 52 MG,) 20 MCG/24HR IUD 1 Intra Uterine Device (1 each total) by Intrauterine route once for 1 dose. 1 each 0  . lisinopril (ZESTRIL) 20 MG tablet Take 1.5 tablets (30 mg total) by mouth daily. 135 tablet 3  . Multiple Vitamins-Minerals (MULTIVITAMIN WITH MINERALS) tablet Take 1 tablet by mouth daily.    . Probiotic Product (PROBIOTIC DAILY PO) Take 1 tablet by mouth daily.     . rosuvastatin (CRESTOR) 10 MG tablet Take 1 tablet (10 mg total) by mouth daily. 90 tablet 1  . Ubrogepant (UBRELVY) 100 MG TABS Take 1 tablet by mouth daily as needed. 30 tablet 1   No current facility-administered medications on file prior to visit.   Physical exam:  Today's Vitals   08/25/20 0818  BP: 132/90  Pulse: 63  Weight: 213 lb (96.6 kg)  Height: 5\' 7"  (1.702 m)   Body mass index is 33.36 kg/m.   Wt Readings from Last 3 Encounters:  08/25/20 213 lb (96.6 kg)  08/19/20 206 lb (93.4 kg)  08/04/20 208 lb (94.3 kg)     Ht Readings from Last 3 Encounters:  08/25/20 5\' 7"  (1.702 m)  08/19/20 5\' 7"  (1.702 m)  08/04/20 5\' 7"  (1.702 m)      General: The patient is awake, alert and appears not in acute distress. The patient is well groomed. Head: Normocephalic, atraumatic. Neck is supple. Mallampati  2-3 neck circumference: 16.5  inches .  Nasal airflow is patent.  Retrognathia is notseen.  Dental status: inact Cardiovascular:  Regular rate and cardiac rhythm  by pulse,  without distended neck veins. Respiratory: Lungs are clear to auscultation.  Skin:  Without evidence of ankle edema, or rash. Trunk: The patient's posture is  erect.BMI has been reduced from 34.7 kg/m2, to now 33.    Neurologic exam : The patient is awake and alert, oriented to place and time.   Memory subjective described as impaired some times, cognitive testing has not been done.  Attention span & concentration ability appears normal.  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 [place] [facility]  Mood and affect are appropriate.   Cranial nerves: no loss of smell or taste reported  Pupils are equal and briskly reactive to light.  Funduscopic exam- deferred. .  Extraocular movements in vertical and horizontal planes were intact and without nystagmus. No Diplopia. Visual fields by finger perimetry may indicate impairment on the right side.  Hearing was intact to soft voice and finger rubbing.    Facial sensation intact to fine touch.  Facial motor strength is symmetric and tongue and uvula move midline.  Neck ROM : rotation, tilt and flexion extension were normal for age and shoulder shrug was symmetrical.    Motor exam:  Symmetric bulk, tone and ROM.   Normal tone without cog wheeling, asymmetric grip strength , a bit weaker on the left. .   Sensory:  Fine touch, pinprick and vibration were tested  and  normal.  Proprioception tested in the upper extremities was normal.   Coordination: Rapid alternating movements in the fingers/hands were of normal speed.  The Finger-to-nose maneuver was intact , mild dysmetria on the left with pronator drift.  no tremor.   Gait and station: Patient could rise unassisted from a seated position, walked without assistive device.  Stance is of normal width/ base and the patient turned with 3 steps.  Toe and heel walk were deferred.  Deep tendon reflexes: in the  upper and lower extremities are symmetric and intact.  Babinski response was deferred.        After spending a total time of 35 minutes face to face time for physical and neurologic examination,  review of laboratory studies,  personal review of imaging studies, reports and results of other testing and review of referral information / records as far as provided in visit, I have established the following assessments:  1)  No sleep apnea found, in spite of risk factors - obesity, smoking and observed snoring. A large airway and neck.  2)   Insomnia improved under Lunesta - 2 mg, lets try 1 mg next- and only on workdays.  3)  Continue weight and wellness. Smoking cessation, and weight loss.   4)  Talk to gynecologist about Mirena being a possible stroke risk factor.  5) memory impaired- she has visual spatial deficits, judging distance , interpreting a clock face, and also procedural memory.   Referral to cognitive testing and ophthalmology for visual fields.  Consider low dose aricept for brain booster.     I would like to thank Tiffany [place],MD , Delia Heady, MD  and Tracey Grandchild, Md [address] [place],  [place] [address] , for allowing me to meet with and to take care of this pleasant patient.    I plan to follow up either personally or through our NP within 6 month.   CC: I will share my notes with Dr Pearlean Brownie.   Electronically signed by: Tracey Novas, MD 08/25/2020 8:35  AM  [facility] and [facility] certified by [employer] American Board of Sleep Medicine and Diplomate of the [employer]. Board certified In Neurology through the ABPN, Fellow of the [employer] of [employer]. Medical Director of [facility].

## 2020-08-30 ENCOUNTER — Telehealth: Payer: Self-pay

## 2020-08-30 NOTE — Telephone Encounter (Signed)
Ophthalmology referral sent to [facility]. [phone].

## 2020-09-06 ENCOUNTER — Ambulatory Visit (INDEPENDENT_AMBULATORY_CARE_PROVIDER_SITE_OTHER): Payer: 59 | Admitting: Family Medicine

## 2020-09-06 NOTE — Telephone Encounter (Signed)
Signed and placed in outbox.  Thank you. ?

## 2020-09-06 NOTE — Telephone Encounter (Signed)
Gave completed/signed form back to medical records to process for pt. 

## 2020-09-23 ENCOUNTER — Ambulatory Visit (INDEPENDENT_AMBULATORY_CARE_PROVIDER_SITE_OTHER): Payer: 59 | Admitting: Family Medicine

## 2020-09-23 ENCOUNTER — Encounter (INDEPENDENT_AMBULATORY_CARE_PROVIDER_SITE_OTHER): Payer: Self-pay

## 2020-10-05 ENCOUNTER — Other Ambulatory Visit: Payer: Self-pay | Admitting: Internal Medicine

## 2020-10-05 ENCOUNTER — Telehealth: Payer: Self-pay

## 2020-10-05 DIAGNOSIS — E785 Hyperlipidemia, unspecified: Secondary | ICD-10-CM

## 2020-10-05 MED ORDER — ROSUVASTATIN CALCIUM 10 MG PO TABS: 10.0000 mg | ORAL_TABLET | Freq: Every day | ORAL | 1 refills | Status: DC

## 2020-10-12 ENCOUNTER — Encounter (INDEPENDENT_AMBULATORY_CARE_PROVIDER_SITE_OTHER): Payer: Self-pay | Admitting: Family Medicine

## 2020-10-12 ENCOUNTER — Ambulatory Visit (INDEPENDENT_AMBULATORY_CARE_PROVIDER_SITE_OTHER): Payer: 59 | Admitting: Family Medicine

## 2020-10-12 ENCOUNTER — Other Ambulatory Visit: Payer: Self-pay

## 2020-10-12 VITALS — BP 132/79 | HR 65 | Temp 98.1°F | Ht 67.0 in | Wt 206.0 lb

## 2020-10-12 DIAGNOSIS — Z6832 Body mass index (BMI) 32.0-32.9, adult: Secondary | ICD-10-CM | POA: Diagnosis not present

## 2020-10-12 DIAGNOSIS — E669 Obesity, unspecified: Secondary | ICD-10-CM

## 2020-10-12 DIAGNOSIS — F3289 Other specified depressive episodes: Secondary | ICD-10-CM

## 2020-10-12 DIAGNOSIS — I10 Essential (primary) hypertension: Secondary | ICD-10-CM | POA: Diagnosis not present

## 2020-10-12 DIAGNOSIS — Z9189 Other specified personal risk factors, not elsewhere classified: Secondary | ICD-10-CM

## 2020-10-12 MED ORDER — BUPROPION HCL 75 MG PO TABS: 75.0000 mg | ORAL_TABLET | Freq: Two times a day (BID) | ORAL | 0 refills | Status: DC

## 2020-10-13 NOTE — Telephone Encounter (Signed)
Received this notice from [facility]: "Rejection Reason - Patient did not respond - Called the patient x2 and the patient never called back to get an appt scheduled"

## 2020-10-18 NOTE — Progress Notes (Signed)
Chief Complaint:   OBESITY Tracey [name] is here to discuss her progress with her obesity treatment plan along with follow-up of her obesity related diagnoses. Tracey [name] is on the Category 3 Plan and keeping a food journal and adhering to recommended goals of 300-400 calories and 20+ grams of protein at breakfast daily and states she is following her eating plan approximately 60% of the time. Tracey [name] states she is doing 0 minutes 0 times per week.  Today's visit was #: 4 Starting weight: 209 lbs Starting date: 07/21/2020 Today's weight: 206 lbs  Today's date: 10/12/2020 Total lbs lost to date: 3 Total lbs lost since last in-office visit: 0  Interim History: Tracey [name] has done well maintaining her weight. She is struggling to eat all of the food on her plan, and she is open to looking at other options.  Subjective:   1. Essential hypertension Tracey [name]'s blood pressure is well controlled on her medications, and with diet and weight loss. She denies signs of hypotension.  2. Other depression with emotional eating Tracey [name] is stable on Wellbutrin. She is working on decreasing emotional eating behaviors, and she is doing well overall. Her blood pressure is stable.  3. At risk for dehydration Tracey [name] is at risk for dehydration due to inadequate water intake.  Assessment/Plan:   1. Essential hypertension Tracey [name] is working on healthy weight loss and exercise to improve blood pressure control. We will watch for signs of hypotension as she continues her lifestyle modifications.  2. Other depression with emotional eating Behavior modification techniques were discussed today to help Tracey [name] deal with her emotional/non-hunger eating behaviors.  Orders and follow up as documented in patient record.   - buPROPion (WELLBUTRIN) 75 MG tablet; Take 1 tablet (75 mg total) by mouth 2 (two) times daily.  Dispense: 60 tablet; Refill: 0  3. At risk for dehydration Tracey [name] was given approximately 15 minutes dehydration prevention  counseling today. Tracey [name] is at risk for dehydration due to weight loss and current medication(s). She was encouraged to hydrate and monitor fluid status to avoid dehydration as well as weight loss plateaus.   4. Obesity with current BMI 32.4 Tracey [name] is currently in the action stage of change. As such, her goal is to continue with weight loss efforts. She has agreed to keeping a food journal and adhering to recommended goals of 1300-1600 calories and 85+ grams of protein daily.   Behavioral modification strategies: increasing lean protein intake and increasing water intake.  Tracey [name] has agreed to follow-up with our clinic in 2 to 3 weeks. She was informed of the importance of frequent follow-up visits to maximize her success with intensive lifestyle modifications for her multiple health conditions.   Objective:   Blood pressure 132/79, pulse 65, temperature 98.1 F (36.7 C), height 5\' 7"  (1.702 m), weight 206 lb (93.4 kg), SpO2 97 %. Body mass index is 32.26 kg/m.  General: Cooperative, alert, well developed, in no acute distress. HEENT: Conjunctivae and lids unremarkable. Cardiovascular: Regular rhythm.  Lungs: Normal work of breathing. Neurologic: No focal deficits.   Lab Results  Component Value Date   CREATININE 1.04 (H) 07/21/2020   BUN 9 07/21/2020   NA 140 07/21/2020   K 4.0 07/21/2020   CL 105 07/21/2020   CO2 21 07/21/2020   Lab Results  Component Value Date   ALT 15 07/21/2020   AST 13 07/21/2020   ALKPHOS 59 07/21/2020   BILITOT 0.5 07/21/2020   Lab Results  Component Value Date  HGBA1C 5.5 07/21/2020   HGBA1C 5.1 04/03/2020   HGBA1C 5.1 11/10/2017   HGBA1C 5.6 06/04/2017   Lab Results  Component Value Date   INSULIN 16.6 07/21/2020   Lab Results  Component Value Date   TSH 1.130 07/21/2020   Lab Results  Component Value Date   CHOL 102 07/21/2020   HDL 40 07/21/2020   LDLCALC 51 07/21/2020   TRIG 40 07/21/2020   CHOLHDL 4.0 04/03/2020   Lab Results   Component Value Date   VD25OH 59.1 07/21/2020   VD25OH 26.2 (L) 06/04/2017   VD25OH 14.3 (L) 06/23/2015   Lab Results  Component Value Date   WBC 6.5 07/21/2020   HGB 13.9 07/21/2020   HCT 43.0 07/21/2020   MCV 89 07/21/2020   PLT 279 07/21/2020   No results found for: IRON, TIBC, FERRITIN  Attestation Statements:   Reviewed by clinician on day of visit: allergies, medications, problem list, medical history, surgical history, family history, social history, and previous encounter notes.   I, Burt Knack, am acting as transcriptionist for Quillian Quince, MD.  I have reviewed the above documentation for accuracy and completeness, and I agree with the above. -  Quillian Quince, MD

## 2020-10-26 ENCOUNTER — Emergency Department (HOSPITAL_BASED_OUTPATIENT_CLINIC_OR_DEPARTMENT_OTHER): Payer: 59

## 2020-10-26 ENCOUNTER — Encounter (HOSPITAL_BASED_OUTPATIENT_CLINIC_OR_DEPARTMENT_OTHER): Payer: Self-pay | Admitting: Obstetrics and Gynecology

## 2020-10-26 ENCOUNTER — Other Ambulatory Visit: Payer: Self-pay

## 2020-10-26 ENCOUNTER — Emergency Department (HOSPITAL_BASED_OUTPATIENT_CLINIC_OR_DEPARTMENT_OTHER)
Admission: EM | Admit: 2020-10-26 | Discharge: 2020-10-26 | Disposition: A | Discharge status: Home / Self Care | Payer: 59 | Attending: Emergency Medicine | Admitting: Emergency Medicine

## 2020-10-26 DIAGNOSIS — F17210 Nicotine dependence, cigarettes, uncomplicated: Secondary | ICD-10-CM | POA: Diagnosis not present

## 2020-10-26 DIAGNOSIS — S0011XA Contusion of right eyelid and periocular area, initial encounter: Secondary | ICD-10-CM | POA: Diagnosis not present

## 2020-10-26 DIAGNOSIS — I10 Essential (primary) hypertension: Secondary | ICD-10-CM | POA: Diagnosis not present

## 2020-10-26 DIAGNOSIS — W228XXA Striking against or struck by other objects, initial encounter: Secondary | ICD-10-CM | POA: Diagnosis not present

## 2020-10-26 DIAGNOSIS — S0990XA Unspecified injury of head, initial encounter: Secondary | ICD-10-CM | POA: Diagnosis present

## 2020-10-26 DIAGNOSIS — Z79899 Other long term (current) drug therapy: Secondary | ICD-10-CM | POA: Diagnosis not present

## 2020-10-26 DIAGNOSIS — Z7982 Long term (current) use of aspirin: Secondary | ICD-10-CM | POA: Diagnosis not present

## 2020-10-26 DIAGNOSIS — Z7902 Long term (current) use of antithrombotics/antiplatelets: Secondary | ICD-10-CM | POA: Diagnosis not present

## 2020-10-26 IMAGING — CT CT HEAD W/O CM
4 series · 17 of 47 positions shown, 19 images · non-contrast
Comparison: Most recent head CT 07/04/2020, brain MRI 04/03/2020

CLINICAL DATA: Head trauma, minor (Age >= 65y) On plavix and
aspirin, prior stroke

Patient reports she struck her head with car door.
EXAM:
CT HEAD WITHOUT CONTRAST
TECHNIQUE: Contiguous axial images were obtained from the base of the skull
through the vertex without intravenous contrast.

[Series 2: head wo · axial · 0.39mm/px · z∈[-261,-146]mm · 7 of 31 slices shown, 9 images]
[im 4/31  brain]
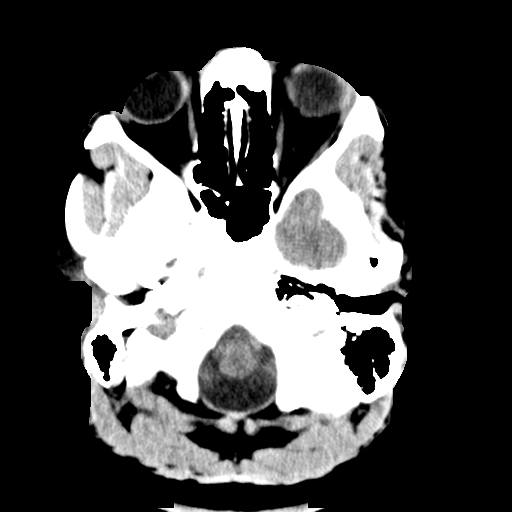
[im 4/31  bone]
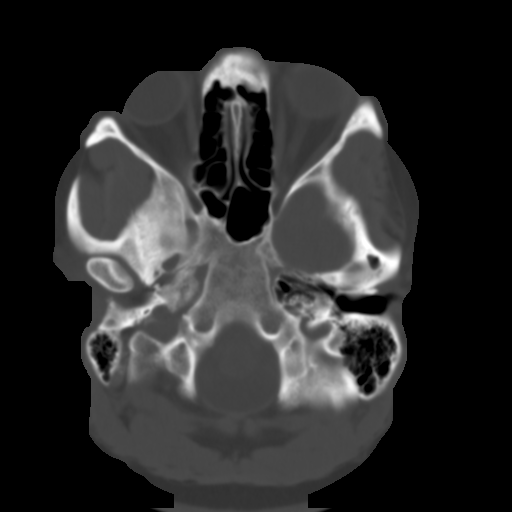
[im 8/31  brain]
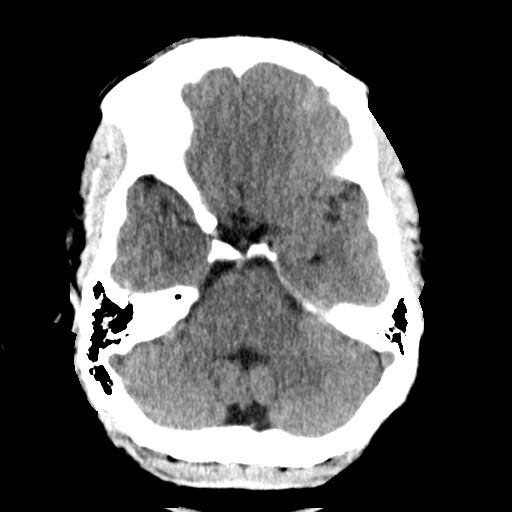
[im 12/31  brain]
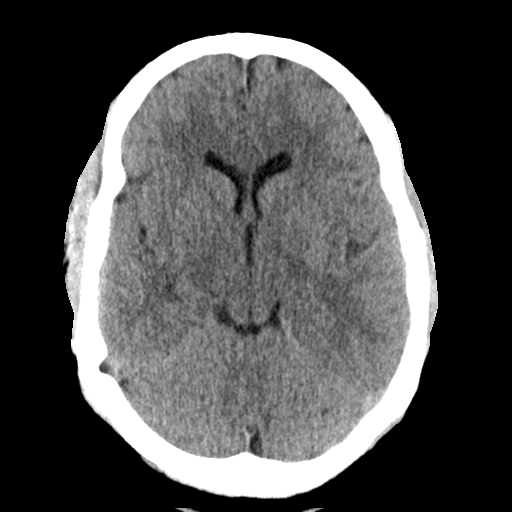
[im 16/31  brain]
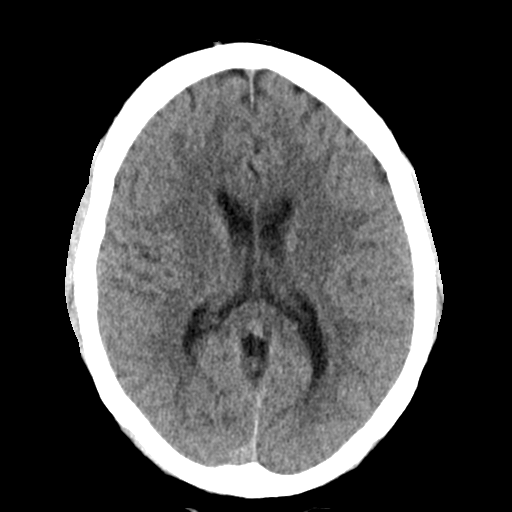
[im 19/31  brain]
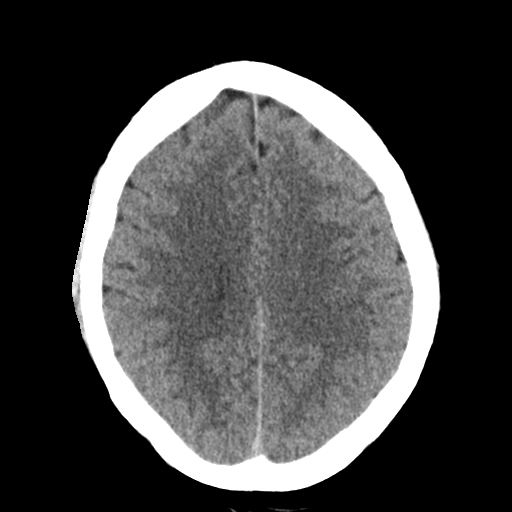
[im 19/31  bone]
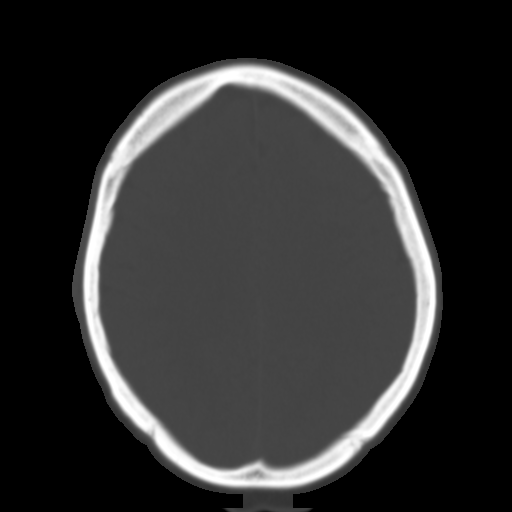
[im 23/31  brain]
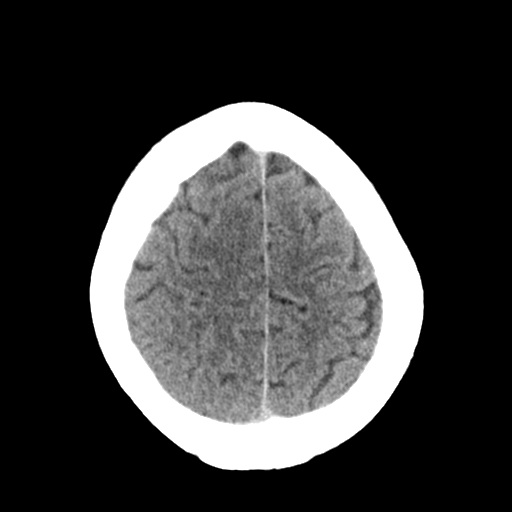
[im 27/31  brain]
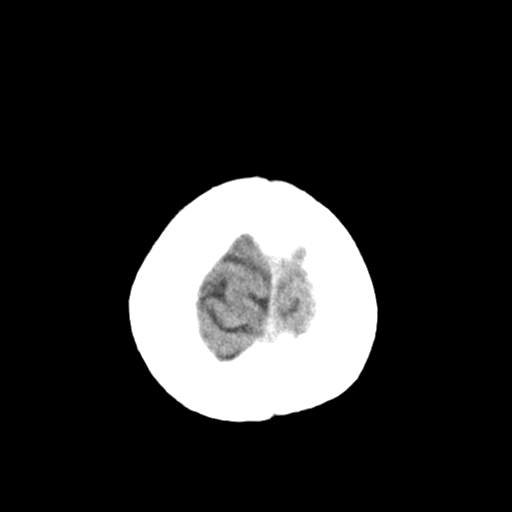

[Series 3: head bone · axial · 0.39mm/px · z∈[-262,-208]mm · 4 of 77 slices shown]
[im 8/77  bone]
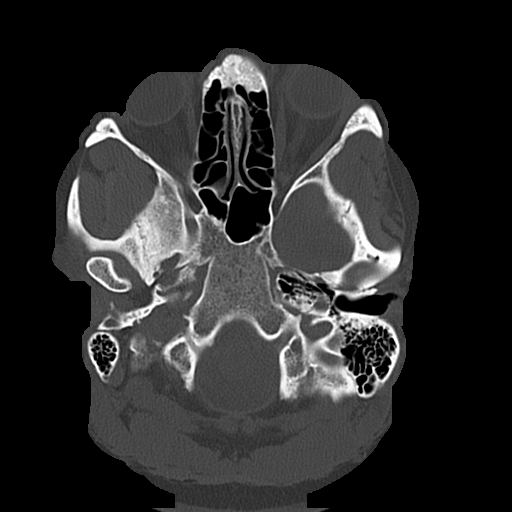
[im 16/77  bone]
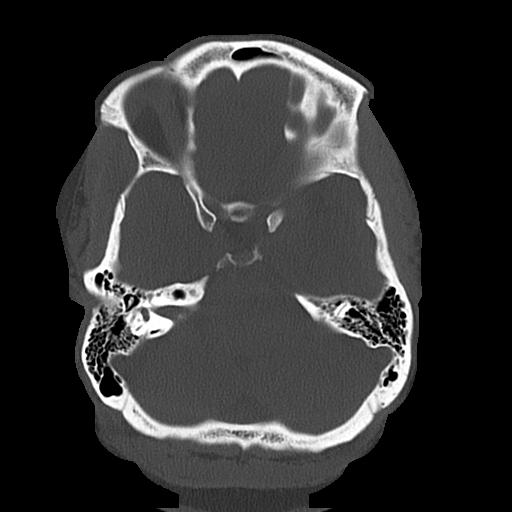
[im 23/77  bone]
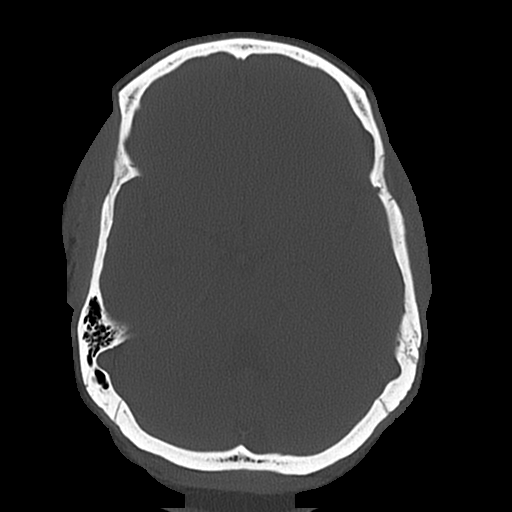
[im 35/77  bone]
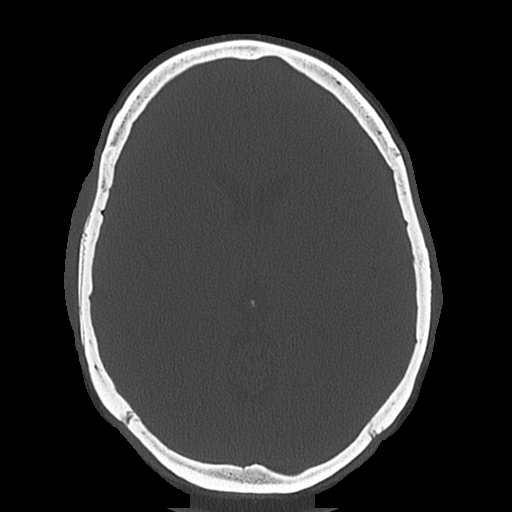

[Series 4: coronal soft · coronal · 0.35mm/px · 3 of 66 slices shown]
[im 22/66  brain]
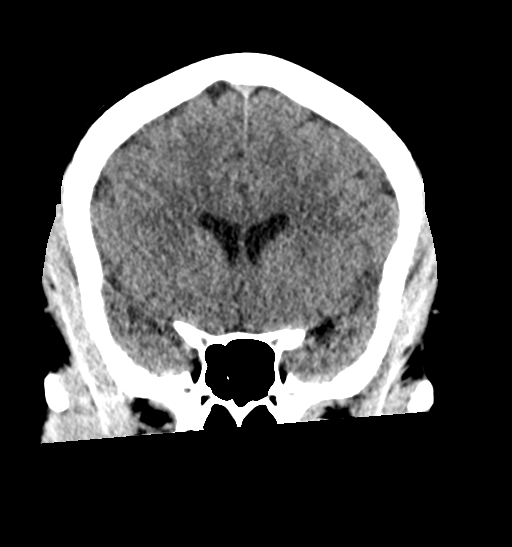
[im 29/66  brain]
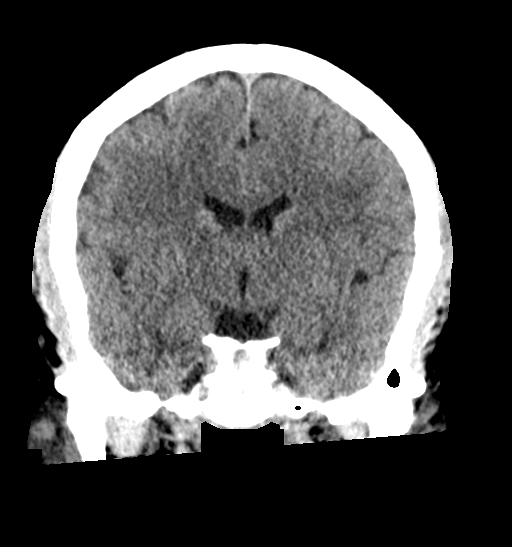
[im 37/66  brain]
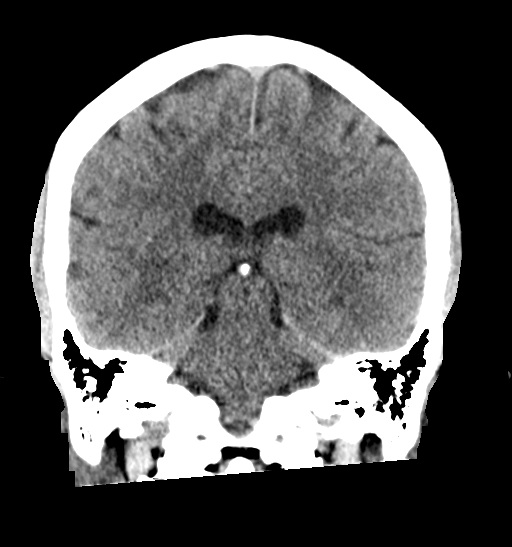

[Series 5: sagittal soft · sagittal · 0.37mm/px · 3 of 59 slices shown]
[im 20/59  brain]
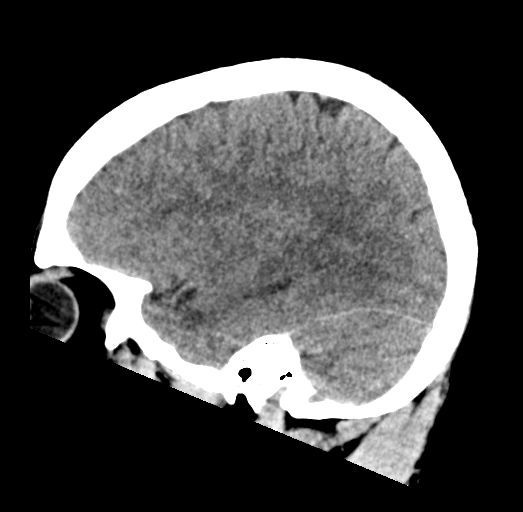
[im 30/59  brain]
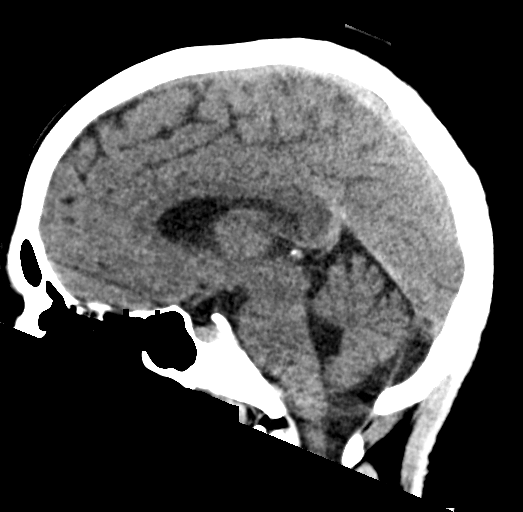
[im 39/59  brain]
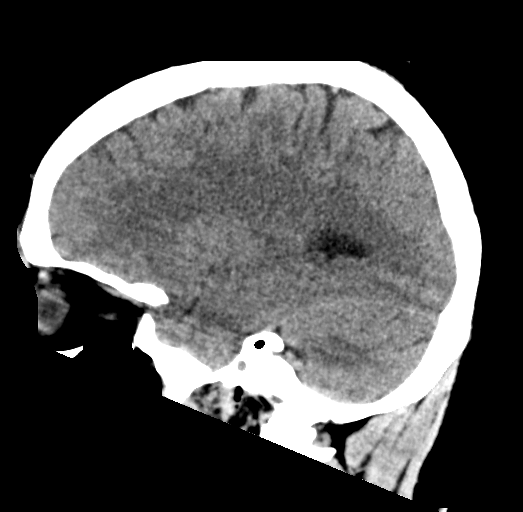

[17 of 47 positions shown; findings below may reference images not displayed]

FINDINGS: Brain: No intracranial hemorrhage, mass effect, or midline shift. No
hydrocephalus. The basilar cisterns are patent. No evidence of
territorial infarct or acute ischemia. No CT correlate to tiny
infarct on prior MRI. No extra-axial or intracranial fluid
collection.

Vascular: No hyperdense vessel or unexpected calcification.

Skull: Normal. Negative for fracture or focal lesion.

Sinuses/Orbits: Paranasal sinuses and mastoid air cells are clear.
The visualized orbits are unremarkable.

Other: None.
IMPRESSION: No acute intracranial abnormality. No skull fracture.

## 2020-10-26 NOTE — Discharge Instructions (Addendum)
The CAT scan was normal without any signs of internal bleeding.  If you develop severe headache, vision changes, nausea or vomiting or confusion you should return immediately for repeat CAT scan

## 2020-10-26 NOTE — ED Provider Notes (Signed)
[facility] EMERGENCY DEPT Provider Note   CSN: [id number] Arrival date & time: 10/26/20  1800     History Chief Complaint  Patient presents with   Head Injury    Tracey [name] is a 45 y.o. female.  Patient is a 45 year old female with a history of hypertension, high cholesterol, stroke on Plavix and aspirin who is presenting today after head injury.  She was rushing out to get into her car when she opened the door quickly hitting her right temple.  She initially felt slightly dazed but denies any loss of consciousness.  Since that time she has not had significant headache, visual changes, nausea or vomiting.  She denies any neck pain.  No difficulty ambulating or dizziness.  However because of being on blood thinners and looking on the Internet she started to get very nervous and came to be evaluated.  The history is provided by the patient.  Head Injury     Past Medical History:  Diagnosis Date   Anxiety    Bronchitis    Carpal tunnel syndrome on both sides    Constipation    Depression    Family history of adverse reaction to anesthesia    mother had problems waking up from surgery.   Gestational diabetes    Gestational diabetes mellitus    Normal 2 hr GTT postpartum   Headache    migraines   High cholesterol    Hypertension    Infertility, female    Lactose intolerance    Oligohydramnios antepartum 11/09/2017   Palpitations    Panic attack 2017   diagnosed 3 months ago. no meds presently   Reflux    did not filll prescription   S/P percutaneous patent foramen ovale closure 06/16/2020   s/p PFO occluder device with a a 25 MM AMPLATZER by Dr. Excell Seltzer   Stroke [facility])    Swelling    Vitamin D deficiency     Patient Active Problem List   Diagnosis Date Noted   Psychophysiological insomnia 07/08/2020   Chronic migraine without aura without status migrainosus, not intractable 07/08/2020   S/P percutaneous patent foramen ovale closure 06/16/2020    Cryptogenic stroke (HCC) - R frontal lobe 05/31/2020   PFO (patent foramen ovale) - ROPE score 5 05/31/2020   Hypokalemia 04/03/2020   Hypertension    Depression    Anxiety 11/27/2017   Vitamin D deficiency 06/07/2017    Past Surgical History:  Procedure Laterality Date   BREAST BIOPSY  right    cyst   BUBBLE STUDY  04/16/2020   Procedure: BUBBLE STUDY;  Surgeon: Chilton Si, MD;  Location: [facility] ENDOSCOPY;  Service: Cardiovascular;;   DILATION AND EVACUATION N/A 07/26/2015   Procedure: DILATATION AND EVACUATION;  Surgeon: Brock Bad, MD;  Location: [facility] ORS;  Service: Gynecology;  Laterality: N/A;   PATENT FORAMEN OVALE(PFO) CLOSURE N/A 06/16/2020   Procedure: PATENT FORAMEN OVALE (PFO) CLOSURE;  Surgeon: Tonny Bollman, MD;  Location: [facility] INVASIVE CV LAB;  Service: Cardiovascular;  Laterality: N/A;   TEE WITHOUT CARDIOVERSION N/A 04/16/2020   Procedure: TRANSESOPHAGEAL ECHOCARDIOGRAM (TEE);  Surgeon: Chilton Si, MD;  Location: [facility] ENDOSCOPY;  Service: Cardiovascular;  Laterality: N/A;     OB History     Gravida  5   Para  3   Term  2   Preterm  1   AB  2   Living  3      SAB  2   IAB  0   Ectopic  0   Multiple  0   Live Births  3           Family History  Problem Relation Age of Onset   Hypertension Mother    Diabetes Maternal Aunt    Breast cancer Maternal Aunt    Diabetes Maternal Uncle    Anxiety disorder Father    Depression Father    Alcoholism Father     Social History   Tobacco Use   Smoking status: Some Days    Packs/day: 0.25    Types: Cigarettes   Smokeless tobacco: Never   Tobacco comments:    2 cig/day  Vaping Use   Vaping Use: Never used  Substance Use Topics   Alcohol use: No    Alcohol/week: 0.0 standard drinks   Drug use: No    Home Medications Prior to Admission medications   Medication Sig Start Date End Date Taking? Authorizing Provider  aspirin 81 MG EC tablet Take 81 mg by mouth daily. Swallow whole.     [provider]  buPROPion (WELLBUTRIN) 75 MG tablet Take 1 tablet (75 mg total) by mouth 2 (two) times daily. 10/12/20   Quillian Quince D, MD  clopidogrel (PLAVIX) 75 MG tablet Take 1 tablet (75 mg total) by mouth daily. 05/28/20 11/24/20  Tonny Bollman, MD  donepezil (ARICEPT) 5 MG tablet Take 1 tablet (5 mg total) by mouth at bedtime. 08/25/20   Dohmeier, Porfirio Mylar, MD  eszopiclone (LUNESTA) 2 MG TABS tablet Take 1 tablet (2 mg total) by mouth at bedtime as needed for sleep. Take immediately before bedtime 07/08/20   Etta Grandchild, MD  levonorgestrel (MIRENA, 52 MG,) 20 MCG/24HR IUD 1 Intra Uterine Device (1 each total) by Intrauterine route once for 1 dose. 07/10/20   Etta Grandchild, MD  lisinopril (ZESTRIL) 20 MG tablet Take 1.5 tablets (30 mg total) by mouth daily. 04/13/20 07/12/20  Meriam Sprague, MD  Multiple Vitamins-Minerals (MULTIVITAMIN WITH MINERALS) tablet Take 1 tablet by mouth daily.    [provider]  Probiotic Product (PROBIOTIC DAILY PO) Take 1 tablet by mouth daily.     [provider]  rosuvastatin (CRESTOR) 10 MG tablet Take 1 tablet (10 mg total) by mouth daily. 10/05/20 04/06/21  Etta Grandchild, MD  Ubrogepant (UBRELVY) 100 MG TABS Take 1 tablet by mouth daily as needed. 07/08/20   Etta Grandchild, MD    Allergies    Patient has no known allergies.  Review of Systems   Review of Systems  All other systems reviewed and are negative.  Physical Exam Updated Vital Signs BP 140/85 (BP Location: Right Arm)   Pulse 83   Temp 98.4 F (36.9 C) (Oral)   Resp 17   SpO2 100%   Physical Exam Vitals and nursing note reviewed.  Constitutional:      General: She is not in acute distress.    Appearance: Normal appearance.  HENT:     Head: Normocephalic and atraumatic.      Nose: Nose normal.  Eyes:     Pupils: Pupils are equal, round, and reactive to light.  Cardiovascular:     Rate and Rhythm: Normal rate.     Pulses: Normal pulses.   Pulmonary:     Effort: Pulmonary effort is normal.  Musculoskeletal:     Cervical back: Normal range of motion and neck supple. No tenderness.     Right lower leg: No edema.     Left lower leg: No edema.  Skin:    General: Skin is warm and dry.  Neurological:     General: No focal deficit present.     Mental Status: She is alert and oriented to person, place, and time. Mental status is at baseline.     Sensory: No sensory deficit.     Motor: No weakness.     Gait: Gait normal.  Psychiatric:        Mood and Affect: Mood normal.        Behavior: Behavior normal.    ED Results / Procedures / Treatments   Labs (all labs ordered are listed, but only abnormal results are displayed) Labs Reviewed - No data to display  EKG None  Radiology CT HEAD WO CONTRAST ( )  Result Date: 10/26/2020 CLINICAL DATA:  Head trauma, minor (Age >= 65y) On plavix and aspirin, prior stroke Patient reports she struck her head with car door. EXAM: CT HEAD WITHOUT CONTRAST TECHNIQUE: Contiguous axial images were obtained from the base of the skull through the vertex without intravenous contrast. COMPARISON:  Most recent head CT 07/04/2020, brain MRI 04/03/2020 FINDINGS: Brain: No intracranial hemorrhage, mass effect, or midline shift. No hydrocephalus. The basilar cisterns are patent. No evidence of territorial infarct or acute ischemia. No CT correlate to tiny infarct on prior MRI. No extra-axial or intracranial fluid collection. Vascular: No hyperdense vessel or unexpected calcification. Skull: Normal. Negative for fracture or focal lesion. Sinuses/Orbits: Paranasal sinuses and mastoid air cells are clear. The visualized orbits are unremarkable. Other: None. IMPRESSION: No acute intracranial abnormality. No skull fracture. Electronically Signed   By: Narda Rutherford M.D.   On: 10/26/2020 19:18    Procedures Procedures   Medications Ordered in ED Medications - No data to display  ED Course  I have  reviewed the triage vital signs and the nursing notes.  Pertinent labs & imaging results that were available during my care of the patient were reviewed by me and considered in my medical decision making (see chart for details).    MDM Rules/Calculators/A&P                           Patient presenting today due to head injury and being on Plavix and aspirin.  Her neurologic exam is normal today.  Mild tenderness over the right temple where the door hit her but no visual changes.  CT is negative for acute injury.  Patient discharged home in good condition.  MDM   Amount and/or Complexity of Data Reviewed Tests in the radiology section of CPT: ordered and reviewed Independent visualization of images, tracings, or specimens: yes    Final Clinical Impression(s) / ED Diagnoses Final diagnoses:  Contusion of right periocular region, initial encounter    Rx / DC Orders ED Discharge Orders     None        Gwyneth Sprout, MD 10/26/20 1951

## 2020-10-26 NOTE — ED Triage Notes (Signed)
Patient reports she hit her head with the car door and she is on Plavix and aspirin and is concerned about a head bleed. Patient reports she had a stroke in January. Patient denies N/V.

## 2020-10-27 ENCOUNTER — Other Ambulatory Visit: Payer: Self-pay

## 2020-10-28 NOTE — Telephone Encounter (Signed)
Left message for pt to call back.  She should stop Plavix on 12/17/20 per her TAVR follow up/AVS.  This prescription may need refilled for a short duration to get her to that date.

## 2020-10-29 NOTE — Telephone Encounter (Signed)
Patient knows to stop Plavix on 12/17/20.  She has enough medication to get to that point.  No need to refill.

## 2020-10-29 NOTE — Telephone Encounter (Signed)
Patient returning call from the office.

## 2020-10-30 ENCOUNTER — Other Ambulatory Visit: Payer: Self-pay | Admitting: Internal Medicine

## 2020-10-30 DIAGNOSIS — F3289 Other specified depressive episodes: Secondary | ICD-10-CM

## 2020-11-01 ENCOUNTER — Other Ambulatory Visit: Payer: Self-pay

## 2020-11-01 ENCOUNTER — Encounter (INDEPENDENT_AMBULATORY_CARE_PROVIDER_SITE_OTHER): Payer: Self-pay | Admitting: Family Medicine

## 2020-11-01 ENCOUNTER — Ambulatory Visit (INDEPENDENT_AMBULATORY_CARE_PROVIDER_SITE_OTHER): Payer: 59 | Admitting: Family Medicine

## 2020-11-01 ENCOUNTER — Ambulatory Visit: Payer: 59 | Admitting: Internal Medicine

## 2020-11-01 VITALS — BP 116/71 | HR 67 | Temp 98.5°F | Ht 67.0 in | Wt 204.0 lb

## 2020-11-01 DIAGNOSIS — E669 Obesity, unspecified: Secondary | ICD-10-CM | POA: Diagnosis not present

## 2020-11-01 DIAGNOSIS — Z9189 Other specified personal risk factors, not elsewhere classified: Secondary | ICD-10-CM | POA: Diagnosis not present

## 2020-11-01 DIAGNOSIS — E8881 Metabolic syndrome: Secondary | ICD-10-CM | POA: Diagnosis not present

## 2020-11-01 DIAGNOSIS — I10 Essential (primary) hypertension: Secondary | ICD-10-CM | POA: Diagnosis not present

## 2020-11-01 DIAGNOSIS — F419 Anxiety disorder, unspecified: Secondary | ICD-10-CM

## 2020-11-01 DIAGNOSIS — Z6832 Body mass index (BMI) 32.0-32.9, adult: Secondary | ICD-10-CM

## 2020-11-01 MED ORDER — SERTRALINE HCL 25 MG PO TABS: 25.0000 mg | ORAL_TABLET | Freq: Every day | ORAL | 0 refills | Status: DC

## 2020-11-02 ENCOUNTER — Other Ambulatory Visit (INDEPENDENT_AMBULATORY_CARE_PROVIDER_SITE_OTHER): Payer: Self-pay | Admitting: Family Medicine

## 2020-11-02 DIAGNOSIS — F419 Anxiety disorder, unspecified: Secondary | ICD-10-CM

## 2020-11-02 NOTE — Progress Notes (Signed)
Chief Complaint:   OBESITY Tracey [name] is here to discuss her progress with her obesity treatment plan along with follow-up of her obesity related diagnoses. Tracey [name] is on the Category 3 Plan and states she is following her eating plan approximately 80% of the time. Tracey [name] states she is not currently exercising.  Today's visit was #: 5 Starting weight: 209 lbs Starting date: 07/21/2020 Today's weight: 204 lbs Today's date: 11/01/2020 Total lbs lost to date: 5 Total lbs lost since last in-office visit: 2  Interim History: Since her last appt, Tracey [name] hit her head with her car door by accident and was concerned over bleeding due to Plavix and aspirin. She feels she has done really well following meal plan. She has no plans over the next few weeks, except working. She is trying to plan a trip to [place] to see her friend.  Subjective:   1. Insulin resistance Tracey [name]'s last A1c was 5.5 and insulin level 16.6. She is not on medication.  2. Essential hypertension BP well controlled today. Pt denies chest pain/chest pressure/headache. She is on lisinopril 20 mg.  3. Anxiety and Depression Tracey [name] reports symptoms have slightly increased since her stroke. She is still smoking occasionally.  4. At risk for anxiety Tracey [name] is at risk for anxiety due to stopping Plavix soon.  Assessment/Plan:   1. Insulin resistance Tracey [name] will continue to work on weight loss, exercise, and decreasing simple carbohydrates to help decrease the risk of diabetes. Tracey [name] agreed to follow-up with [place] as directed to closely monitor her progress. Follow up labs at next draw in September.  2. Essential hypertension Tracey [name] is working on healthy weight loss and exercise to improve blood pressure control. We will watch for signs of hypotension as she continues her lifestyle modifications. Follow up at next appt.  3. Anxiety and Depression Behavior modification techniques were discussed today to help Tracey [name] deal with her anxiety.   Orders and follow up as documented in patient record.  Start Tracey [name] 25 mg.  Start- Tracey [name] (ZOLOFT) 25 MG tablet; Take 1 tablet (25 mg total) by mouth daily.  Dispense: 30 tablet; Refill: 0  4. At risk for anxiety Tracey [name] was given approximately 15 minutes of anxiety risk counseling today. She has risk factors for anxiety including stopping Plavix. We discussed the importance of a healthy work life balance, a healthy relationship with food and a good support system.  Repetitive spaced learning was employed today to elicit superior memory formation and behavioral change.   5. Obesity with current BMI of 32.0  Tracey [name] is currently in the action stage of change. As such, her goal is to continue with weight loss efforts. She has agreed to the Category 3 Plan.   Exercise goals:  Start walking 15 minutes 3 times a week.  Behavioral modification strategies: increasing lean protein intake, meal planning and cooking strategies, keeping healthy foods in the home, and avoiding temptations.  Tracey [name] has agreed to follow-up with our clinic in 3 weeks. She was informed of the importance of frequent follow-up visits to maximize her success with intensive lifestyle modifications for her multiple health conditions.   Objective:   Blood pressure 116/71, pulse 67, temperature 98.5 F (36.9 C), height 5\' 7"  (1.702 m), weight 204 lb (92.5 kg), SpO2 100 %. Body mass index is 31.95 kg/m.  General: Cooperative, alert, well developed, in no acute distress. HEENT: Conjunctivae and lids unremarkable. Cardiovascular: Regular rhythm.  Lungs: Normal work of breathing. Neurologic: No focal deficits.   Lab  Results  Component Value Date   CREATININE 1.04 (H) 07/21/2020   BUN 9 07/21/2020   NA 140 07/21/2020   K 4.0 07/21/2020   CL 105 07/21/2020   CO2 21 07/21/2020   Lab Results  Component Value Date   ALT 15 07/21/2020   AST 13 07/21/2020   ALKPHOS 59 07/21/2020   BILITOT 0.5 07/21/2020   Lab  Results  Component Value Date   HGBA1C 5.5 07/21/2020   HGBA1C 5.1 04/03/2020   HGBA1C 5.1 11/10/2017   HGBA1C 5.6 06/04/2017   Lab Results  Component Value Date   INSULIN 16.6 07/21/2020   Lab Results  Component Value Date   TSH 1.130 07/21/2020   Lab Results  Component Value Date   CHOL 102 07/21/2020   HDL 40 07/21/2020   LDLCALC 51 07/21/2020   TRIG 40 07/21/2020   CHOLHDL 4.0 04/03/2020   Lab Results  Component Value Date   VD25OH 59.1 07/21/2020   VD25OH 26.2 (L) 06/04/2017   VD25OH 14.3 (L) 06/23/2015   Lab Results  Component Value Date   WBC 6.5 07/21/2020   HGB 13.9 07/21/2020   HCT 43.0 07/21/2020   MCV 89 07/21/2020   PLT 279 07/21/2020    Attestation Statements:   Reviewed by clinician on day of visit: allergies, medications, problem list, medical history, surgical history, family history, social history, and previous encounter notes.  Edmund Hilda, CMA, am acting as transcriptionist for Reuben Likes, MD.   I have reviewed the above documentation for accuracy and completeness, and I agree with the above. - Reuben Likes, MD

## 2020-11-03 ENCOUNTER — Encounter: Payer: Self-pay | Admitting: Adult Health

## 2020-11-03 ENCOUNTER — Ambulatory Visit (INDEPENDENT_AMBULATORY_CARE_PROVIDER_SITE_OTHER): Payer: 59 | Admitting: Adult Health

## 2020-11-03 ENCOUNTER — Other Ambulatory Visit: Payer: Self-pay

## 2020-11-03 VITALS — BP 139/88 | HR 73 | Ht 67.0 in | Wt 209.0 lb

## 2020-11-03 DIAGNOSIS — R41841 Cognitive communication deficit: Secondary | ICD-10-CM

## 2020-11-03 DIAGNOSIS — F418 Other specified anxiety disorders: Secondary | ICD-10-CM

## 2020-11-03 DIAGNOSIS — Q2112 Patent foramen ovale: Secondary | ICD-10-CM

## 2020-11-03 DIAGNOSIS — E785 Hyperlipidemia, unspecified: Secondary | ICD-10-CM

## 2020-11-03 DIAGNOSIS — I10 Essential (primary) hypertension: Secondary | ICD-10-CM | POA: Diagnosis not present

## 2020-11-03 DIAGNOSIS — I639 Cerebral infarction, unspecified: Secondary | ICD-10-CM

## 2020-11-03 DIAGNOSIS — Q211 Atrial septal defect: Secondary | ICD-10-CM | POA: Diagnosis not present

## 2020-11-03 NOTE — Patient Instructions (Signed)
Continue aspirin 81 mg daily and clopidogrel 75 mg daily  and Crestor for secondary stroke prevention  Continue to follow up with PCP regarding cholesterol and blood pressure management  Maintain strict control of hypertension with blood pressure goal below 130/90 and cholesterol with LDL cholesterol (bad cholesterol) goal below 70 mg/dL.       Followup in the future with me in 6 months or call earlier if needed       Thank you for coming to see [place] at [facility]. I hope we have been able to provide you high quality care today.  You may receive a patient satisfaction survey over the next few weeks. We would appreciate your feedback and comments so that we may continue to improve ourselves and the health of our patients.

## 2020-11-03 NOTE — Progress Notes (Signed)
[facility] [address] [place] [id number] [phone]       STROKE FOLLOW UP NOTE  [name] Date of Birth:  [birth year] Medical Record Number:  [id number]   Reason for Referral: stroke follow up    SUBJECTIVE:   CHIEF COMPLAINT:  Chief Complaint  Patient presents with   Follow-up    RM 3 alone Pt is well and stable, has occasional L sided numbness in hand and foot but overall well.       HPI:   Today, 11/03/2020, Tracey [name] for 3 month stroke follow up unaccompanied. Overall stable.  Speech has been greatly improving since prior visit.  Occasional word finding difficulty more so when speaking to those she is not familiar with or with increased anxiety.  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  Management has been very supportive and has been slowly relearning prior job duties and training as needed.  Also reports fluctuation of left hand fingertips and tips of toes on left foot numbness especially in the evening while relaxing - present since stroke onset - not new.  Recently started on sertraline for anxiety worsened post stroke.  denies new stroke/TIA symptoms. Compliant on aspirin, Plavix and Crestor-denies associated side effects.  Blood pressure today 139/88.  Being followed by healthy weight and wellness with a 5 pound weight loss since May.  Followed by Dr. Vickey Hugerohmeier for insomnia on Lunesta with benefit.  No further concerns at this time.    History provided for reference purposes only Update 08/02/2020 JM: Tracey [name] for 2154-month stroke follow-up accompanied by her 392 yo daughter  Doing well since prior visit from stroke standpoint without new stroke/TIA symptoms.  Residual cognitive impairment gradually improving with greatest difficulty with recall or getting words together especially when talking to somebody she does not know or with increased anxiety (per SLP  difficulties with thought organization and managing some day-to-day tasks).  She continues to work routinely with neuro rehab SLP.  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   Reports compliance on aspirin, plavix (post PFO closure) and Crestor without associated side effects Blood pressure today 135/83  S/p PFO closure 06/16/2020 without complication Sleep study 06/13/2020 negative for sleep apnea (although only captured 3.5 hours of sleep however all sleep stages were captured) -chronic history of insomnia and migraines recently started on Lunesta and Ubrelvy per PCP with benefit.  Also on bupropion for depression/anxiety.  No further concerns at this time  Initial visit 05/03/2020 JM: Tracey [name] for hospital follow-up accompanied by her husband.  She reports gradual improvement since discharge with residual LLE weakness (feels like knee wants to hyperextend), gait impairment and cognitive impairment.  Currently working with PT and has initial evaluation with SLP tomorrow for cognitive therapy.  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  Baseline depression/anxiety with worsening anxiety and depression but also self discontinued bupropion at hospital discharge as she was fearful of possible increased stroke risk with continued use.  Reports increased fatigue since her stroke with fatigue PTA as well as snoring, witnessed apnea and insomnia.  She has not previously underwent sleep study.  Denies new stroke/TIA symptoms.  She has completed 3 weeks DAPT and remains on aspirin alone without bleeding or bruising.  Remains on Crestor  10 mg daily without myalgias.  Blood pressure today 149/77.  She does admit to smoking 1 cigarette/day with goal of complete tobacco cessation in the near future.  TEE completed 04/16/2020 which showed  evidence of PFO.  Initial evaluation with Dr. Excell Seltzer for possible closure scheduled on 05/28/2020.  Hypercoagulable labs negative.  No further concerns at this time.  Stroke admission 04/02/2020 Tracey [name] is a 45 y.o. female with history of hypertension, anxiety (panic attacks), headaches (migraines), ongoing tobacco use, and depression (just started Wellbutrin on the day of admission)  who presented to the [facility] emergency department  on 04/02/2020 and [name] by teleNeurology for acute onset neck discomfort, left leg numbness / weakness, left arm numbness and a pressure sensation at the base of her posterior neck in the midline.   Personally reviewed hospitalization pertinent progress notes, lab work and imaging with summary provided.  Shortly transferred to [facility] evaluated by Dr. Pearlean Brownie with stroke work-up revealing small acute infarct in the juxtacortical high right posterior frontal lobe, possibly embolic secondary to unknown source.  Recommend completion of TEE outpatient.  Hypercoagulable labs pending at discharge.  Recommended DAPT for 3 weeks and aspirin alone. LDL 101 and initiated Crestor 10 mg daily.  A1c 5.1.  Other stroke risk factors include current tobacco use, obesity and history of migraines but no prior stroke history.  Other active problems include suspected sleep apnea, bradycardia and hypokalemia.  Evaluated by therapies and recommended outpatient PT and discharged home in stable condition.  Stroke: Small acute infarct in the juxtacortical high right posterior frontal lobe possibly embolic - etiology unknown. Resultant mild left leg weakness and numbness which is resolving Code Stroke CT Head -  Normal head CT. ASPECTS is 10.     CT head - not ordered MRI head - Small acute infarct in the juxtacortical high right posterior frontal lobe. Minimal edema without mass effect. Additional mild scattered T2/FLAIR hyperintensities within the white matter, mildly advanced for age. These are  nonspecific and could relate to chronic microvascular ischemic disease, prior migraines, trauma, demyelination, or inflammation. CTA H&N - normal 2D Echo - EF 55 - 60%. No cardiac source of emboli identified.  [employer] Virus 2 - negative LDL - 101 HgbA1c - 5.1 UDS - negative VTE prophylaxis - Lovenox No antithrombotic prior to admission, now on aspirin 81 mg daily and clopidogrel 75 mg daily for 3 weeks then aspirin alone Patient will be counseled to be compliant with her antithrombotic medications Ongoing aggressive stroke risk factor management Therapy recommendations:  Outpt PT Disposition:  Home     ROS:   14 system review of systems performed and negative with exception of those listed in HPI  PMH:  Past Medical History:  Diagnosis Date   Anxiety    Bronchitis    Carpal tunnel syndrome on both sides    Constipation    Depression    Family history of adverse reaction to anesthesia    mother had problems waking up from surgery.   Gestational diabetes    Gestational diabetes mellitus    Normal 2 hr GTT postpartum   Headache    migraines   High cholesterol    Hypertension    Infertility, female    Lactose intolerance    Oligohydramnios antepartum 11/09/2017   Palpitations    Panic attack 2017   diagnosed 3 months ago. no meds presently   Reflux    did not filll prescription   S/P percutaneous patent foramen ovale  closure 06/16/2020   s/p PFO occluder device with a a 25 MM AMPLATZER by Dr. Excell Seltzer   Stroke [facility])    Swelling    Vitamin D deficiency     PSH:  Past Surgical History:  Procedure Laterality Date   BREAST BIOPSY  right    cyst   BUBBLE STUDY  04/16/2020   Procedure: BUBBLE STUDY;  Surgeon: Chilton Si, MD;  Location: [facility] ENDOSCOPY;  Service: Cardiovascular;;   DILATION AND EVACUATION N/A 07/26/2015   Procedure: DILATATION AND EVACUATION;  Surgeon: Brock Bad, MD;  Location: [facility] ORS;  Service: Gynecology;  Laterality: N/A;   PATENT FORAMEN  OVALE(PFO) CLOSURE N/A 06/16/2020   Procedure: PATENT FORAMEN OVALE (PFO) CLOSURE;  Surgeon: Tonny Bollman, MD;  Location: [facility] INVASIVE CV LAB;  Service: Cardiovascular;  Laterality: N/A;   TEE WITHOUT CARDIOVERSION N/A 04/16/2020   Procedure: TRANSESOPHAGEAL ECHOCARDIOGRAM (TEE);  Surgeon: Chilton Si, MD;  Location: [facility] ENDOSCOPY;  Service: Cardiovascular;  Laterality: N/A;    Social History:  Social History   Socioeconomic History   Marital status: Married    Spouse name: Not on file   Number of children: Not on file   Years of education: Not on file   Highest education level: Not on file  Occupational History   Occupation: Not Working  Tobacco Use   Smoking status: Some Days    Packs/day: 0.25    Types: Cigarettes   Smokeless tobacco: Never   Tobacco comments:    2 cig/day  Vaping Use   Vaping Use: Never used  Substance and Sexual Activity   Alcohol use: No    Alcohol/week: 0.0 standard drinks   Drug use: No   Sexual activity: Not Currently  Other Topics Concern   Not on file  Social History Narrative   Lives at home with spouse and her 2 children   Right handed   Caffeine: 2 cups/day   Social Determinants of Health   Financial Resource Strain: Not on file  Food Insecurity: Not on file  Transportation Needs: Not on file  Physical Activity: Not on file  Stress: Not on file  Social Connections: Not on file  Intimate Partner Violence: Not on file    Family History:  Family History  Problem Relation Age of Onset   Hypertension Mother    Diabetes Maternal Aunt    Breast cancer Maternal Aunt    Diabetes Maternal Uncle    Anxiety disorder Father    Depression Father    Alcoholism Father     Medications:   Current Outpatient Medications on File Prior to Visit  Medication Sig Dispense Refill   aspirin 81 MG EC tablet Take 81 mg by mouth daily. Swallow whole.     buPROPion (WELLBUTRIN) 75 MG tablet TAKE 1 TABLET(75 MG) BY MOUTH TWICE DAILY 180 tablet 0    clopidogrel (PLAVIX) 75 MG tablet Take 1 tablet (75 mg total) by mouth daily. 90 tablet 1   eszopiclone (LUNESTA) 2 MG TABS tablet Take 1 tablet (2 mg total) by mouth at bedtime as needed for sleep. Take immediately before bedtime 90 tablet 1   levonorgestrel (MIRENA, 52 MG,) 20 MCG/24HR IUD 1 Intra Uterine Device (1 each total) by Intrauterine route once for 1 dose. 1 each 0   lisinopril (ZESTRIL) 20 MG tablet Take 1.5 tablets (30 mg total) by mouth daily. 135 tablet 3   Multiple Vitamins-Minerals (MULTIVITAMIN WITH MINERALS) tablet Take 1 tablet by mouth daily.     Probiotic Product (  PROBIOTIC DAILY PO) Take 1 tablet by mouth daily.      rosuvastatin (CRESTOR) 10 MG tablet Take 1 tablet (10 mg total) by mouth daily. 90 tablet 1   sertraline (ZOLOFT) 25 MG tablet Take 1 tablet (25 mg total) by mouth daily. 30 tablet 0   Ubrogepant (UBRELVY) 100 MG TABS Take 1 tablet by mouth daily as needed. 30 tablet 1   No current facility-administered medications on file prior to visit.    Allergies:  No Known Allergies    OBJECTIVE:  Physical Exam  Vitals:   11/03/20 0726  BP: 139/88  Pulse: 73  Weight: 209 lb (94.8 kg)  Height: 5\' 7"  (1.702 m)    Body mass index is 32.73 kg/m. No results found.  General: well developed, well nourished,  very pleasant middle-aged African-American female, seated, in no evident distress Head: head normocephalic and atraumatic.   Neck: supple with no carotid or supraclavicular bruits Cardiovascular: regular rate and rhythm, no murmurs Musculoskeletal: no deformity Skin:  no rash/petichiae Vascular:  Normal pulses all extremities   Neurologic Exam Mental Status: Awake and fully alert.   Fluent speech and language with occasional hesitancy.  Oriented to place and time. Recent memory impaired although improving and remote memory intact. Attention span, concentration and fund of knowledge appropriate during visit. Mood and affect appropriate.  Cranial  Nerves: Pupils equal, briskly reactive to light. Extraocular movements full without nystagmus. Visual fields full to confrontation. Hearing intact. Facial sensation intact. Face, tongue, palate moves normally and symmetrically.  Motor: Normal bulk and tone. Normal strength in all tested extremity Sensory.: intact to touch , pinprick , position and vibratory sensation.  Subjective intermittent left fingertips and left toes numbness since stroke Coordination: Rapid alternating movements normal in all extremities. Finger-to-nose and heel-to-shin performed accurately bilaterally. Gait and Station: Arises from chair without difficulty. Stance is normal. Gait demonstrates  normal stride length and balance without use of assistive device.  Tandem walk and heel toe with mild difficulty Reflexes: 1+ and symmetric. Toes downgoing.        ASSESSMENT: Tracey [name] is a 45 y.o. year old female with small acute infarct in the juxtacortical high right posterior frontal lobe on 03/02/2021 possibly embolic secondary to unknown source although evidence of PFO. Vascular risk factors include HTN, HLD, tobacco use, obesity, migraines and PFO     PLAN:  R frontal lobe stroke, cryptogenic:  Residual deficit: Cognitive communication deficit - greatly improved since prior visit.  Intermittent left hand and foot numbness/tingling since stroke - no sensory deficit or weakness noted.  She has since returned back to work at 03/04/2021 claims PFO s/p closure 06/16/2020 Hypercoagulable labs and 30-day cardiac event monitor negative Continue aspirin 81 mg daily  and Crestor 10 mg daily for secondary stroke prevention.   Discussed secondary stroke prevention measures and importance of close PCP follow up for aggressive stroke risk factor management  PFO: As evidenced on TEE 04/16/2020 S/p closure 06/16/2020 for ROPE score 5 indicating 34% chance that stroke was due to PFO Remains aspirin and  Plavix for total of 6 months (~9/30) and aspirin alone and routinely follows with cardiology - discussed during visit HTN: BP goal <130/90.  Stable on current regimen per PCP HLD: LDL goal <70. Prior LDL 51 (07/2020) down from 08-02-1996 on Crestor 10 mg daily managed and prescribed by PCP Depression/anxiety: Worsened post stroke.  Recently started on sertraline 25 mg daily by healthy weight and wellness in addition  to bupropion Chronic insomnia: Followed by Dr. Vickey Huger on [place]    Follow up in 6 months or call earlier if needed   CC:  [facility] provider: Dr. Mechele Collin, Bernadene Bell, MD    I spent 32 minutes of face-to-face and non-face-to-face time with patient.  This included previsit chart review, lab review, study review, electronic health record documentation, patient education and discussion regarding history of prior stroke and residual deficits, secondary stroke prevention measures and aggressive stroke risk factor management, chronic anxiety worsened post stroke and answered all other questions to patient satisfaction   Ihor Austin, AGNP-BC  [facility] [address] [place], [place] [address]  Phone [phone] Fax [phone] Note: This document was prepared with digital dictation and possible smart phrase technology. Any transcriptional errors that result from this process are unintentional.

## 2020-11-09 NOTE — Progress Notes (Signed)
I agree with the above plan 

## 2020-11-18 ENCOUNTER — Encounter: Payer: 59 | Admitting: Obstetrics and Gynecology

## 2020-11-24 ENCOUNTER — Other Ambulatory Visit: Payer: Self-pay

## 2020-11-24 ENCOUNTER — Ambulatory Visit (INDEPENDENT_AMBULATORY_CARE_PROVIDER_SITE_OTHER): Payer: 59 | Admitting: Family Medicine

## 2020-11-24 ENCOUNTER — Encounter (INDEPENDENT_AMBULATORY_CARE_PROVIDER_SITE_OTHER): Payer: Self-pay | Admitting: Family Medicine

## 2020-11-24 VITALS — BP 128/89 | HR 87 | Temp 98.3°F | Ht 67.0 in | Wt 205.0 lb

## 2020-11-24 DIAGNOSIS — Z6832 Body mass index (BMI) 32.0-32.9, adult: Secondary | ICD-10-CM | POA: Diagnosis not present

## 2020-11-24 DIAGNOSIS — Z9189 Other specified personal risk factors, not elsewhere classified: Secondary | ICD-10-CM

## 2020-11-24 DIAGNOSIS — E669 Obesity, unspecified: Secondary | ICD-10-CM | POA: Diagnosis not present

## 2020-11-24 DIAGNOSIS — E559 Vitamin D deficiency, unspecified: Secondary | ICD-10-CM | POA: Diagnosis not present

## 2020-11-24 NOTE — Progress Notes (Signed)
Chief Complaint:   OBESITY Tracey [name] is here to discuss her progress with her obesity treatment plan along with follow-up of her obesity related diagnoses. Tracey [name] is on the Category 3 Plan and states she is following her eating plan approximately 80% of the time. Tracey [name] states she is walking for 30 minutes 2 times per week.  Today's visit was #: 6 Starting weight: 209 lbs Starting date: 07/21/2020 Today's weight: 205 lbs Today's date: 11/24/2020 Total lbs lost to date: 4 Total lbs lost since last in-office visit: 0  Interim History: Tracey [name] is retaining a bit of fluid today. She deviated from her plan more over Labor Day, and she is sometimes skipping meals especially breakfast.  Subjective:   1. Vitamin D deficiency Tracey [name] is on multivitamins, and she is doing well with weight loss which can improve low Vit D.  2. At risk for impaired metabolic function Tracey [name] is at increased risk for impaired metabolic function if she is skipping meals or calories are too low.  Assessment/Plan:   1. Vitamin D deficiency Low Vitamin D level contributes to fatigue and are associated with obesity, breast, and colon cancer. Tracey [name] will continue taking multivitamis, and we will recheck labs in 6 weeks to reassess her level. She will follow-up for routine testing of Vitamin D, at least 2-3 times per year to avoid over-replacement.  2. At risk for impaired metabolic function Tracey [name] was given approximately 15 minutes of impaired  metabolic function prevention counseling today. We discussed intensive lifestyle modifications today with an emphasis on specific nutrition and exercise instructions and strategies.   Repetitive spaced learning was employed today to elicit superior memory formation and behavioral change.  3. Obesity with current BMI of 32.1 Tracey [name] is currently in the action stage of change. As such, her goal is to continue with weight loss efforts. She has agreed to change to keeping a food journal and  adhering to recommended goals of 1200-1600 calories and 90+ grams of protein daily.   Exercise goals: As is.  Behavioral modification strategies: increasing lean protein intake and no skipping meals.  Tracey [name] has agreed to follow-up with our clinic in 3 weeks. She was informed of the importance of frequent follow-up visits to maximize her success with intensive lifestyle modifications for her multiple health conditions.   Objective:   Blood pressure 128/89, pulse 87, temperature 98.3 F (36.8 C), height 5\' 7"  (1.702 m), weight 205 lb (93 kg), SpO2 100 %. Body mass index is 32.11 kg/m.  General: Cooperative, alert, well developed, in no acute distress. HEENT: Conjunctivae and lids unremarkable. Cardiovascular: Regular rhythm.  Lungs: Normal work of breathing. Neurologic: No focal deficits.   Lab Results  Component Value Date   CREATININE 1.04 (H) 07/21/2020   BUN 9 07/21/2020   NA 140 07/21/2020   K 4.0 07/21/2020   CL 105 07/21/2020   CO2 21 07/21/2020   Lab Results  Component Value Date   ALT 15 07/21/2020   AST 13 07/21/2020   ALKPHOS 59 07/21/2020   BILITOT 0.5 07/21/2020   Lab Results  Component Value Date   HGBA1C 5.5 07/21/2020   HGBA1C 5.1 04/03/2020   HGBA1C 5.1 11/10/2017   HGBA1C 5.6 06/04/2017   Lab Results  Component Value Date   INSULIN 16.6 07/21/2020   Lab Results  Component Value Date   TSH 1.130 07/21/2020   Lab Results  Component Value Date   CHOL 102 07/21/2020   HDL 40 07/21/2020   LDLCALC 51  07/21/2020   TRIG 40 07/21/2020   CHOLHDL 4.0 04/03/2020   Lab Results  Component Value Date   VD25OH 59.1 07/21/2020   VD25OH 26.2 (L) 06/04/2017   VD25OH 14.3 (L) 06/23/2015   Lab Results  Component Value Date   WBC 6.5 07/21/2020   HGB 13.9 07/21/2020   HCT 43.0 07/21/2020   MCV 89 07/21/2020   PLT 279 07/21/2020   No results found for: IRON, TIBC, FERRITIN  Attestation Statements:   Reviewed by clinician on day of visit:  allergies, medications, problem list, medical history, surgical history, family history, social history, and previous encounter notes.   I, Burt Knack, am acting as transcriptionist for Quillian Quince, MD.  I have reviewed the above documentation for accuracy and completeness, and I agree with the above. -  Quillian Quince, MD

## 2020-12-09 ENCOUNTER — Encounter: Payer: 59 | Admitting: Obstetrics

## 2020-12-15 ENCOUNTER — Encounter (INDEPENDENT_AMBULATORY_CARE_PROVIDER_SITE_OTHER): Payer: Self-pay

## 2020-12-15 ENCOUNTER — Ambulatory Visit (INDEPENDENT_AMBULATORY_CARE_PROVIDER_SITE_OTHER): Payer: 59 | Admitting: Family Medicine

## 2020-12-21 ENCOUNTER — Ambulatory Visit: Payer: 59 | Admitting: Physician Assistant

## 2020-12-29 ENCOUNTER — Ambulatory Visit (INDEPENDENT_AMBULATORY_CARE_PROVIDER_SITE_OTHER): Payer: 59 | Admitting: Family Medicine

## 2020-12-29 ENCOUNTER — Other Ambulatory Visit: Payer: Self-pay

## 2020-12-29 ENCOUNTER — Encounter (INDEPENDENT_AMBULATORY_CARE_PROVIDER_SITE_OTHER): Payer: Self-pay | Admitting: Family Medicine

## 2020-12-29 VITALS — BP 124/78 | HR 75 | Temp 98.3°F | Ht 67.0 in | Wt 201.0 lb

## 2020-12-29 DIAGNOSIS — E669 Obesity, unspecified: Secondary | ICD-10-CM | POA: Diagnosis not present

## 2020-12-29 DIAGNOSIS — Z9189 Other specified personal risk factors, not elsewhere classified: Secondary | ICD-10-CM | POA: Diagnosis not present

## 2020-12-29 DIAGNOSIS — Z6832 Body mass index (BMI) 32.0-32.9, adult: Secondary | ICD-10-CM | POA: Diagnosis not present

## 2020-12-29 DIAGNOSIS — F419 Anxiety disorder, unspecified: Secondary | ICD-10-CM

## 2020-12-29 DIAGNOSIS — E7849 Other hyperlipidemia: Secondary | ICD-10-CM

## 2020-12-29 MED ORDER — BUPROPION HCL ER (SR) 150 MG PO TB12: 150.0000 mg | ORAL_TABLET | Freq: Every day | ORAL | 0 refills | Status: DC

## 2020-12-29 MED ORDER — ROSUVASTATIN CALCIUM 10 MG PO TABS: 10.0000 mg | ORAL_TABLET | Freq: Every day | ORAL | 0 refills | Status: DC

## 2020-12-29 MED ORDER — SERTRALINE HCL 25 MG PO TABS: 25.0000 mg | ORAL_TABLET | Freq: Every day | ORAL | 0 refills | Status: DC

## 2020-12-29 NOTE — Progress Notes (Signed)
Chief Complaint:   OBESITY Tracey [name] is here to discuss her progress with her obesity treatment plan along with follow-up of her obesity related diagnoses. Tracey [name] is on keeping a food journal and adhering to recommended goals of 1200-1600 calories and 90+ grams of protein daily and states she is following her eating plan approximately 85% of the time. Tracey [name] states she is walking for 30 minutes 2 times per week.  Today's visit was #: 7 Starting weight: 209 lbs Starting date: 07/21/2020 Today's weight: 201 lbs Today's date: 12/29/2020 Total lbs lost to date: 8 Total lbs lost since last in-office visit: 4  Interim History: Tracey [name] continues to do well with weight loss. She is deviating from her plan for breakfast, and she notes increased polyphagia on those days.  Subjective:   1. Other hyperlipidemia Tracey [name] is stable on Crestor, and she is doing well with diet and weight loss.  2. Anxiety and Depression Tracey [name]'s mood is stable on her medications. She is struggling to remember her second dose of Wellbutrin.  3. At risk for heart disease Tracey [name] is at a higher than average risk for cardiovascular disease due to obesity.   Assessment/Plan:   1. Other hyperlipidemia Cardiovascular risk and specific lipid/LDL goals reviewed. We discussed several lifestyle modifications today. We will refill Crestor for 1 month. Tracey [name] will continue to work on diet, exercise and weight loss efforts. Orders and follow up as documented in patient record.   - rosuvastatin (CRESTOR) 10 MG tablet; Take 1 tablet (10 mg total) by mouth daily.  Dispense: 30 tablet; Refill: 0  2. Anxiety and Depression Behavior modification techniques were discussed today to help Tracey [name] deal with her anxiety and depression. Tracey [name] agreed to increase Wellbutrin SR to 150 mg q AM with no refills, and we will refill Zoloft for 1 month. Orders and follow up as documented in patient record.   - sertraline (ZOLOFT) 25 MG tablet; Take 1 tablet  (25 mg total) by mouth daily.  Dispense: 30 tablet; Refill: 0 - buPROPion (WELLBUTRIN SR) 150 MG 12 hr tablet; Take 1 tablet (150 mg total) by mouth daily. Take in the morning.  Dispense: 30 tablet; Refill: 0  3. At risk for heart disease Tracey [name] was given approximately 15 minutes of coronary artery disease prevention counseling today. She is 45 y.o. female and has risk factors for heart disease including obesity. We discussed intensive lifestyle modifications today with an emphasis on specific weight loss instructions and strategies.   Repetitive spaced learning was employed today to elicit superior memory formation and behavioral change.  4. Obesity with current BMI of 31.5 Tracey [name] is currently in the action stage of change. As such, her goal is to continue with weight loss efforts. She has agreed to keeping a food journal and adhering to recommended goals of 1400-1600 calories and 90+ grams of protein daily.   Protein smoothie recipes were given. Tracey [name] is to decrease morning simple carbohydrates.  Exercise goals: As is.  Behavioral modification strategies: increasing lean protein intake, decreasing simple carbohydrates, and decreasing eating out.  Tracey [name] has agreed to follow-up with our clinic in 3 weeks. She was informed of the importance of frequent follow-up visits to maximize her success with intensive lifestyle modifications for her multiple health conditions.   Objective:   Blood pressure 124/78, pulse 75, temperature 98.3 F (36.8 C), height 5\' 7"  (1.702 m), weight 201 lb (91.2 kg), SpO2 99 %. Body mass index is 31.48 kg/m.  General: Cooperative, alert, well developed,  in no acute distress. HEENT: Conjunctivae and lids unremarkable. Cardiovascular: Regular rhythm.  Lungs: Normal work of breathing. Neurologic: No focal deficits.   Lab Results  Component Value Date   CREATININE 1.04 (H) 07/21/2020   BUN 9 07/21/2020   NA 140 07/21/2020   K 4.0 07/21/2020   CL 105 07/21/2020    CO2 21 07/21/2020   Lab Results  Component Value Date   ALT 15 07/21/2020   AST 13 07/21/2020   ALKPHOS 59 07/21/2020   BILITOT 0.5 07/21/2020   Lab Results  Component Value Date   HGBA1C 5.5 07/21/2020   HGBA1C 5.1 04/03/2020   HGBA1C 5.1 11/10/2017   HGBA1C 5.6 06/04/2017   Lab Results  Component Value Date   INSULIN 16.6 07/21/2020   Lab Results  Component Value Date   TSH 1.130 07/21/2020   Lab Results  Component Value Date   CHOL 102 07/21/2020   HDL 40 07/21/2020   LDLCALC 51 07/21/2020   TRIG 40 07/21/2020   CHOLHDL 4.0 04/03/2020   Lab Results  Component Value Date   VD25OH 59.1 07/21/2020   VD25OH 26.2 (L) 06/04/2017   VD25OH 14.3 (L) 06/23/2015   Lab Results  Component Value Date   WBC 6.5 07/21/2020   HGB 13.9 07/21/2020   HCT 43.0 07/21/2020   MCV 89 07/21/2020   PLT 279 07/21/2020   No results found for: IRON, TIBC, FERRITIN  Attestation Statements:   Reviewed by clinician on day of visit: allergies, medications, problem list, medical history, surgical history, family history, social history, and previous encounter notes.   I, Burt Knack, am acting as transcriptionist for Quillian Quince, MD.  I have reviewed the above documentation for accuracy and completeness, and I agree with the above. -  Quillian Quince, MD

## 2020-12-30 ENCOUNTER — Encounter (HOSPITAL_BASED_OUTPATIENT_CLINIC_OR_DEPARTMENT_OTHER): Payer: Self-pay | Admitting: Cardiovascular Disease

## 2020-12-30 ENCOUNTER — Ambulatory Visit (INDEPENDENT_AMBULATORY_CARE_PROVIDER_SITE_OTHER): Payer: 59 | Admitting: Cardiovascular Disease

## 2020-12-30 VITALS — BP 118/82 | HR 79 | Ht 67.0 in | Wt 206.4 lb

## 2020-12-30 DIAGNOSIS — I639 Cerebral infarction, unspecified: Secondary | ICD-10-CM

## 2020-12-30 DIAGNOSIS — I10 Essential (primary) hypertension: Secondary | ICD-10-CM

## 2020-12-30 DIAGNOSIS — Z8774 Personal history of (corrected) congenital malformations of heart and circulatory system: Secondary | ICD-10-CM

## 2020-12-30 DIAGNOSIS — Q2112 Patent foramen ovale: Secondary | ICD-10-CM

## 2020-12-30 NOTE — Progress Notes (Signed)
Cardiology Office Note   Date:  12/30/2020   ID:  Tracey [name], DOB 06/10/1975, MRN [id number]  PCP:  Etta Grandchild, MD  Cardiologist:   Chilton Si, MD   No chief complaint on file.    History of Present Illness: Tracey [name] is a 45 y.o. female with hypertension, stroke, PFO s/p closure, and prior tobacco abuse who presents for follow-up.  She was diagnosed with hypertension after the birth of her youngest child in 2020.  She was started on lisinopril at that time.  She presented to the hospital 03/2020 with an acute stroke with neck discomfort and left-sided numbness and weakness.  She was found to have a small acute infarct in the right posterior frontal lobe.  It was thought to be embolic.  She followed up with Dr. Excell Seltzer and had a 25 mm Amplatzer occluder device implanted on 06/16/2020.  Postprocedural echo was stable with no residual shunting. At the last appointment she was doing well but was struggling with weight loss.  She was referred to the healthy weight and wellness clinic.  She continues to follow-up with them regularly.  Lately she has been doing well.  Her BP has been around 120/80s.  She is excited that she is losing weight with the HWW program.  She has been trying to exercise more.  She walks regularly and does housework. She has no chest pain or shortness of breath.  The only time she feels anxiousness or like she is holding her breath and palpitations when she is anxious.    Past Medical History:  Diagnosis Date   Anxiety    Bronchitis    Carpal tunnel syndrome on both sides    Constipation    Depression    Family history of adverse reaction to anesthesia    mother had problems waking up from surgery.   Gestational diabetes    Gestational diabetes mellitus    Normal 2 hr GTT postpartum   Headache    migraines   High cholesterol    Hypertension    Infertility, female    Lactose intolerance    Oligohydramnios antepartum 11/09/2017   Palpitations     Panic attack 2017   diagnosed 3 months ago. no meds presently   Reflux    did not filll prescription   S/P percutaneous patent foramen ovale closure 06/16/2020   s/p PFO occluder device with a a 25 MM AMPLATZER by Dr. Excell Seltzer   Stroke [facility])    Swelling    Vitamin D deficiency     Past Surgical History:  Procedure Laterality Date   BREAST BIOPSY  right    cyst   BUBBLE STUDY  04/16/2020   Procedure: BUBBLE STUDY;  Surgeon: Chilton Si, MD;  Location: [facility] ENDOSCOPY;  Service: Cardiovascular;;   DILATION AND EVACUATION N/A 07/26/2015   Procedure: DILATATION AND EVACUATION;  Surgeon: Brock Bad, MD;  Location: [facility] ORS;  Service: Gynecology;  Laterality: N/A;   PATENT FORAMEN OVALE(PFO) CLOSURE N/A 06/16/2020   Procedure: PATENT FORAMEN OVALE (PFO) CLOSURE;  Surgeon: Tonny Bollman, MD;  Location: [facility] INVASIVE CV LAB;  Service: Cardiovascular;  Laterality: N/A;   TEE WITHOUT CARDIOVERSION N/A 04/16/2020   Procedure: TRANSESOPHAGEAL ECHOCARDIOGRAM (TEE);  Surgeon: Chilton Si, MD;  Location: [facility] ENDOSCOPY;  Service: Cardiovascular;  Laterality: N/A;     Current Outpatient Medications  Medication Sig Dispense Refill   aspirin 81 MG EC tablet Take 81 mg by mouth daily. Swallow whole.  buPROPion (WELLBUTRIN SR) 150 MG 12 hr tablet Take 1 tablet (150 mg total) by mouth daily. Take in the morning. 30 tablet 0   eszopiclone (LUNESTA) 2 MG TABS tablet Take 1 tablet (2 mg total) by mouth at bedtime as needed for sleep. Take immediately before bedtime 90 tablet 1   levonorgestrel (MIRENA, 52 MG,) 20 MCG/24HR IUD 1 Intra Uterine Device (1 each total) by Intrauterine route once for 1 dose. 1 each 0   lisinopril (ZESTRIL) 20 MG tablet Take 1.5 tablets (30 mg total) by mouth daily. 135 tablet 3   Multiple Vitamins-Minerals (MULTIVITAMIN WITH MINERALS) tablet Take 1 tablet by mouth daily.     Probiotic Product (PROBIOTIC DAILY PO) Take 1 tablet by mouth daily.      rosuvastatin (CRESTOR)  10 MG tablet Take 1 tablet (10 mg total) by mouth daily. 30 tablet 0   sertraline (ZOLOFT) 25 MG tablet Take 1 tablet (25 mg total) by mouth daily. 30 tablet 0   Ubrogepant (UBRELVY) 100 MG TABS Take 1 tablet by mouth daily as needed. 30 tablet 1   No current facility-administered medications for this visit.    Allergies:   Patient has no known allergies.    Social History:  The patient  reports that she has been smoking cigarettes. She has been smoking an average of .25 packs per day. She has never used smokeless tobacco. She reports that she does not drink alcohol and does not use drugs.   Family History:  The patient's family history includes Alcoholism in her father; Anxiety disorder in her father; Breast cancer in her maternal aunt; Depression in her father; Diabetes in her maternal aunt and maternal uncle; Hypertension in her mother.    ROS:  Please see the history of present illness.   Otherwise, review of systems are positive for none.   All other systems are reviewed and negative.    PHYSICAL EXAM: VS:  BP 118/82 (BP Location: Left Arm, Patient Position: Sitting, Cuff Size: Normal)   Pulse 79   Ht 5\' 7"  (1.702 m)   Wt 206 lb 6.4 oz (93.6 kg)   SpO2 97%   BMI 32.33 kg/m  , BMI Body mass index is 32.33 kg/m. GENERAL:  Well appearing HEENT: Pupils equal round and reactive, fundi not visualized, oral mucosa unremarkable NECK:  No jugular venous distention, waveform within normal limits, carotid upstroke brisk and symmetric, no bruits, no thyromegaly LUNGS:  Clear to auscultation bilaterally HEART:  RRR.  PMI not displaced or sustained,S1 and S2 within normal limits, no S3, no S4, no clicks, no rubs, no murmurs ABD:  Flat, positive bowel sounds normal in frequency in pitch, no bruits, no rebound, no guarding, no midline pulsatile mass, no hepatomegaly, no splenomegaly EXT:  2 plus pulses throughout, no edema, no cyanosis no clubbing SKIN:  No rashes no nodules NEURO:  Cranial  nerves II through XII grossly intact, motor grossly intact throughout PSYCH:  Cognitively intact, oriented to person place and time    EKG:  EKG is not ordered today. The ekg ordered 06/23/20 demonstrates sinus rhythm.  Rate 69 bpm.  Limited echo 06/16/2020 IMPRESSIONS   1. Left ventricular ejection fraction, by estimation, is 60 to 65%. The  left ventricle has normal function. The left ventricle has no regional  wall motion abnormalities.   2. Right ventricular systolic function is normal. The right ventricular  size is normal. There is normal pulmonary artery systolic pressure.   3. 25 mm Amplatzer occluder  device in good position across septum No  obvious residual PFO Frame 15 had color flow at aortic edge of septum on  RA side but I believe this is caval flow.   4. The mitral valve is normal in structure. No evidence of mitral valve  regurgitation. No evidence of mitral stenosis.   5. The aortic valve is normal in structure. Aortic valve regurgitation is  not visualized. No aortic stenosis is present.   6. The inferior vena cava is normal in size with greater than 50%  respiratory variability, suggesting right atrial pressure of 3 mmHg.    06/16/20 PATENT FORAMEN OVALE (PFO) CLOSURE  Conclusion SUCCESSFUL TRANSCATHETER PFO CLOSURE USING INTRACARDIAC ECHO AND FLUOROSCOPIC GUIDANCE. DEFECT CLOSED WITH A 25 MM AMPLATZER PFO OCCLUDER DEVICE   RECOMMEND: ASA/CLOPIDOGREL X 6 MONTHS SBE PROPHYLAXIS X 6 MONTHS LIMITED 2D ECHO POST-PROCEDURE ASSESSMENT   Ambulatory monitor 05/2020: Patient's monitoring period was from 04/16/20-05/15/20 Predominant rhythm was NSR with average HR 69bpm ranging from 45bpm to 127bpm. There were rare SVEs (<1%), occasional PVCs (1%, 16977) No afib, significant pauses or VT Overall, no significant arrhythmias detected on the monitor   Recent Labs: 04/09/2020: Magnesium 1.9 07/21/2020: ALT 15; BUN 9; Creatinine, Ser 1.04; Hemoglobin 13.9; Platelets 279;  Potassium 4.0; Sodium 140; TSH 1.130    Lipid Panel    Component Value Date/Time   CHOL 102 07/21/2020 1052   TRIG 40 07/21/2020 1052   HDL 40 07/21/2020 1052   CHOLHDL 4.0 04/03/2020 0343   VLDL 11 04/03/2020 0343   LDLCALC 51 07/21/2020 1052      Wt Readings from Last 3 Encounters:  12/30/20 206 lb 6.4 oz (93.6 kg)  12/29/20 201 lb (91.2 kg)  11/24/20 205 lb (93 kg)      ASSESSMENT AND PLAN:  # Cryptogenic stroke:  # PFO s/p occlusion:  Etiology of her stroke was not identified.  She had no DVTs and arrhythmias were not detected on her ambulatory monitor.  She does have a PFO that was successfully closed by Dr. Excell Seltzer on 05/2020.  She is now off clopidogrel and continues aspirin indefinitely.   #Hypertension: # Obesity: # Family planning: Blood pressure well have been controlled on lisinopril.  She is doing well and losing weight.  She will work on increasing exercise.  Let us know if she plans for pregnancy so that the ACE-I can be stopped.     Current medicines are reviewed at length with the patient today.  The patient does not have concerns regarding medicines.  The following changes have been made:  no change  Labs/ tests ordered today include:   No orders of the defined types were placed in this encounter.    Disposition:   FU with Marigene Erler C. Duke Salvia, MD, [facility] in 1 year.    Signed, Chrysten Woulfe C. Duke Salvia, MD, [facility]  12/30/2020 11:10 AM    [facility]

## 2020-12-30 NOTE — Patient Instructions (Signed)
Medication Instructions:  ?Your physician recommends that you continue on your current medications as directed. Please refer to the Current Medication list given to you today.  ? ?*If you need a refill on your cardiac medications before your next appointment, please call your pharmacy* ? ?Lab Work: ?NONE ? ?Testing/Procedures: ?NONE ? ?Follow-Up: ?At [facility], you and your health needs are our priority.  As part of our continuing mission to provide you with exceptional heart care, we have created designated Provider Care Teams.  These Care Teams include your primary Cardiologist (physician) and Advanced Practice Providers (APPs -  Physician Assistants and Nurse Practitioners) who all work together to provide you with the care you need, when you need it. ? ?We recommend signing up for the patient portal called "MyChart".  Sign up information is provided on this After Visit Summary.  MyChart is used to connect with patients for Virtual Visits (Telemedicine).  Patients are able to view lab/test results, encounter notes, upcoming appointments, etc.  Non-urgent messages can be sent to your provider as well.   ?To learn more about what you can do with MyChart, go to [email or web address].   ? ?Your next appointment:   ?12 month(s) ? ?The format for your next appointment:   ?In Person ? ?Provider:   ?Tiffany Early, MD  ? ? ? ? ? ? ?

## 2021-01-03 ENCOUNTER — Other Ambulatory Visit: Payer: Self-pay | Admitting: Internal Medicine

## 2021-01-03 ENCOUNTER — Ambulatory Visit (INDEPENDENT_AMBULATORY_CARE_PROVIDER_SITE_OTHER): Payer: 59 | Admitting: Obstetrics and Gynecology

## 2021-01-03 ENCOUNTER — Other Ambulatory Visit: Payer: Self-pay

## 2021-01-03 ENCOUNTER — Other Ambulatory Visit (HOSPITAL_COMMUNITY)
Admission: RE | Admit: 2021-01-03 | Discharge: 2021-01-03 | Disposition: A | Discharge status: Home / Self Care | Payer: 59 | Source: Ambulatory Visit | Attending: Obstetrics and Gynecology | Admitting: Obstetrics and Gynecology

## 2021-01-03 ENCOUNTER — Encounter: Payer: Self-pay | Admitting: Obstetrics and Gynecology

## 2021-01-03 VITALS — BP 130/83 | HR 83 | Ht 66.0 in | Wt 204.0 lb

## 2021-01-03 DIAGNOSIS — Z01419 Encounter for gynecological examination (general) (routine) without abnormal findings: Secondary | ICD-10-CM | POA: Insufficient documentation

## 2021-01-03 DIAGNOSIS — G43709 Chronic migraine without aura, not intractable, without status migrainosus: Secondary | ICD-10-CM

## 2021-01-03 NOTE — Progress Notes (Signed)
Subjective:     Tracey [name] is a 45 y.o. female P3 with BMI 32 who is here for a comprehensive physical exam. The patient reports no problems. She reports Mirena induced amenorrhea. She is sexually active without complaints. She denies pelvic pain or abnormal discharge. She denies urinary incontinence. Patient had a stroked in January and has recovered with minimal deficits. She admits to tobacco smoking and is actively trying to quit. Patient is without any complaints.   Past Medical History:  Diagnosis Date   Anxiety    Bronchitis    Carpal tunnel syndrome on both sides    Constipation    Depression    Family history of adverse reaction to anesthesia    mother had problems waking up from surgery.   Gestational diabetes    Gestational diabetes mellitus    Normal 2 hr GTT postpartum   Headache    migraines   High cholesterol    Hypertension    Infertility, female    Lactose intolerance    Oligohydramnios antepartum 11/09/2017   Palpitations    Panic attack 2017   diagnosed 3 months ago. no meds presently   Reflux    did not filll prescription   S/P percutaneous patent foramen ovale closure 06/16/2020   s/p PFO occluder device with a a 25 MM AMPLATZER by Dr. Excell Seltzer   Stroke [facility])    Swelling    Vitamin D deficiency    Past Surgical History:  Procedure Laterality Date   BREAST BIOPSY  right    cyst   BUBBLE STUDY  04/16/2020   Procedure: BUBBLE STUDY;  Surgeon: Chilton Si, MD;  Location: [facility] ENDOSCOPY;  Service: Cardiovascular;;   DILATION AND EVACUATION N/A 07/26/2015   Procedure: DILATATION AND EVACUATION;  Surgeon: Brock Bad, MD;  Location: [facility] ORS;  Service: Gynecology;  Laterality: N/A;   PATENT FORAMEN OVALE(PFO) CLOSURE N/A 06/16/2020   Procedure: PATENT FORAMEN OVALE (PFO) CLOSURE;  Surgeon: Tonny Bollman, MD;  Location: [facility] INVASIVE CV LAB;  Service: Cardiovascular;  Laterality: N/A;   TEE WITHOUT CARDIOVERSION N/A 04/16/2020   Procedure: TRANSESOPHAGEAL  ECHOCARDIOGRAM (TEE);  Surgeon: Chilton Si, MD;  Location: [facility] ENDOSCOPY;  Service: Cardiovascular;  Laterality: N/A;   Family History  Problem Relation Age of Onset   Hypertension Mother    Diabetes Maternal Aunt    Breast cancer Maternal Aunt    Diabetes Maternal Uncle    Anxiety disorder Father    Depression Father    Alcoholism Father      Social History   Socioeconomic History   Marital status: Married    Spouse name: Not on file   Number of children: Not on file   Years of education: Not on file   Highest education level: Not on file  Occupational History   Occupation: Not Working  Tobacco Use   Smoking status: Some Days    Packs/day: 0.25    Types: Cigarettes   Smokeless tobacco: Never   Tobacco comments:    2 cig/day  Vaping Use   Vaping Use: Never used  Substance and Sexual Activity   Alcohol use: No    Alcohol/week: 0.0 standard drinks   Drug use: No   Sexual activity: Not Currently  Other Topics Concern   Not on file  Social History Narrative   Lives at home with spouse and her 2 children   Right handed   Caffeine: 2 cups/day   Social Determinants of Health   Financial Resource Strain: Low  Risk    Difficulty of Paying Living Expenses: Not hard at all  Food Insecurity: No Food Insecurity   Worried About Running Out of Food in the Last Year: Never true   Ran Out of Food in the Last Year: Never true  Transportation Needs: No Transportation Needs   Lack of Transportation (Medical): No   Lack of Transportation (Non-Medical): No  Physical Activity: Sufficiently Active   Days of Exercise per Week: 5 days   Minutes of Exercise per Session: 30 min  Stress: Not on file  Social Connections: Not on file  Intimate Partner Violence: Not on file   Health Maintenance  Topic Date Due   FOOT EXAM  Never done   OPHTHALMOLOGY EXAM  Never done   URINE MICROALBUMIN  Never done   Hepatitis C Screening  Never done   PAP SMEAR-Modifier  06/04/2020    INFLUENZA VACCINE  10/18/2020   HEMOGLOBIN A1C  01/21/2021   MAMMOGRAM  07/06/2021   TETANUS/TDAP  09/28/2027   COVID-19 Vaccine  Completed   HIV Screening  Completed   HPV VACCINES  Aged Out       Review of Systems Pertinent items noted in HPI and remainder of comprehensive ROS otherwise negative.   Objective:  Blood pressure 130/83, pulse 83, height 5\' 6"  (1.676 m), weight 204 lb (92.5 kg).    GENERAL: Well-developed, well-nourished female in no acute distress.  HEENT: Normocephalic, atraumatic. Sclerae anicteric.  NECK: Supple. Normal thyroid.  LUNGS: Clear to auscultation bilaterally.  HEART: Regular rate and rhythm. BREASTS: Symmetric in size. No palpable masses or lymphadenopathy, skin changes, or nipple drainage. ABDOMEN: Soft, nontender, nondistended. No organomegaly. PELVIC: Normal external female genitalia. Vagina is pink and rugated.  Normal discharge. Normal appearing cervix with IUD strings visualized at the os. Uterus is normal in size. No adnexal mass or tenderness. Chaperone present during the pelvic exam EXTREMITIES: No cyanosis, clubbing, or edema, 2+ distal pulses.    Assessment:    Healthy female exam.      Plan:    Pap smear collected Patient with normal screening mammogram in April 2022 Discussed safety profile of progesterone only contraceptions such as Mirena IUD in the setting of strokes and HTN. Patient wishes to keep her IUD which was inserted in 2019 Patient will be contacted with abnormal results See After Visit Summary for Counseling Recommendations

## 2021-01-05 LAB — CYTOLOGY - PAP
Adequacy: ABSENT
Comment: NEGATIVE
Diagnosis: NEGATIVE
High risk HPV: NEGATIVE

## 2021-01-06 ENCOUNTER — Telehealth: Payer: Self-pay

## 2021-01-06 NOTE — Telephone Encounter (Signed)
Entered in error

## 2021-01-07 ENCOUNTER — Other Ambulatory Visit: Payer: Self-pay

## 2021-01-07 MED ORDER — METRONIDAZOLE 500 MG PO TABS: 500.0000 mg | ORAL_TABLET | Freq: Two times a day (BID) | ORAL | 0 refills | Status: DC

## 2021-01-07 NOTE — Telephone Encounter (Signed)
Patient would like to be treated with flagyl since her test results suggest this infection. Flagyl will be sent to her pharmacy.

## 2021-01-10 ENCOUNTER — Ambulatory Visit: Payer: 59 | Admitting: Internal Medicine

## 2021-01-13 ENCOUNTER — Ambulatory Visit: Payer: 59 | Admitting: Dermatology

## 2021-01-19 ENCOUNTER — Other Ambulatory Visit: Payer: Self-pay

## 2021-01-19 ENCOUNTER — Ambulatory Visit (INDEPENDENT_AMBULATORY_CARE_PROVIDER_SITE_OTHER): Payer: 59 | Admitting: Family Medicine

## 2021-01-19 ENCOUNTER — Encounter (INDEPENDENT_AMBULATORY_CARE_PROVIDER_SITE_OTHER): Payer: Self-pay | Admitting: Family Medicine

## 2021-01-19 VITALS — BP 125/80 | HR 68 | Temp 98.1°F | Ht 67.0 in | Wt 201.0 lb

## 2021-01-19 DIAGNOSIS — Z6832 Body mass index (BMI) 32.0-32.9, adult: Secondary | ICD-10-CM

## 2021-01-19 DIAGNOSIS — Z9189 Other specified personal risk factors, not elsewhere classified: Secondary | ICD-10-CM

## 2021-01-19 DIAGNOSIS — F3289 Other specified depressive episodes: Secondary | ICD-10-CM

## 2021-01-19 DIAGNOSIS — F419 Anxiety disorder, unspecified: Secondary | ICD-10-CM

## 2021-01-19 DIAGNOSIS — N951 Menopausal and female climacteric states: Secondary | ICD-10-CM | POA: Diagnosis not present

## 2021-01-19 DIAGNOSIS — E669 Obesity, unspecified: Secondary | ICD-10-CM

## 2021-01-19 MED ORDER — BUPROPION HCL ER (SR) 150 MG PO TB12: 150.0000 mg | ORAL_TABLET | Freq: Every day | ORAL | 0 refills | Status: DC

## 2021-01-19 MED ORDER — SERTRALINE HCL 25 MG PO TABS: 25.0000 mg | ORAL_TABLET | Freq: Every day | ORAL | 0 refills | Status: DC

## 2021-01-20 NOTE — Progress Notes (Signed)
Chief Complaint:   OBESITY Tracey [name] is here to discuss her progress with her obesity treatment plan along with follow-up of her obesity related diagnoses. Tracey [name] is on keeping a food journal and adhering to recommended goals of 1400-1600 calories and 90+ grams of protein daily and states she is following her eating plan approximately 90% of the time. Tracey [name] states she is walking for 30 minutes 7 times per week.  Today's visit was #: 8 Starting weight: 209 lbs Starting date: 07/21/2020 Today's weight: 201 lbs Today's date: 01/19/2021 Total lbs lost to date: 8 Total lbs lost since last in-office visit: 0  Interim History: Tracey [name] has done well with making healthier food choices. Her hunger is better controlled now that her simple carbohydrates has decreased. She is open to discussing holiday eating strategies.  Subjective:   1. Perimenopause Tracey [name] has questions about perimenopause symptoms and OTC treatments such as black cohosh.  2. Other depression, with emotional eating behaviors Tracey [name]'s mood is stable on her medications. She is doing well with minimizing emotional eating behaviors.  3. At risk for heart disease Tracey [name] is at a higher than average risk for cardiovascular disease due to obesity.   Assessment/Plan:   1. Perimenopause Tracey [name] was educated on symptoms and the pros and cons of supplements to help with her symptoms. Will continue to monitor.  2. Other depression, with emotional eating behaviors Behavior modification techniques were discussed today to help Tracey [name] deal with her emotional/non-hunger eating behaviors. We will refill Wellbutrin SR and Zoloft for 1 month. Orders and follow up as documented in patient record.   - buPROPion (WELLBUTRIN SR) 150 MG 12 hr tablet; Take 1 tablet (150 mg total) by mouth daily. Take in the morning.  Dispense: 30 tablet; Refill: 0 - sertraline (ZOLOFT) 25 MG tablet; Take 1 tablet (25 mg total) by mouth daily.  Dispense: 30 tablet; Refill:  0  3. At risk for heart disease Tracey [name] was given approximately 15 minutes of coronary artery disease prevention counseling today. She is 45 y.o. female and has risk factors for heart disease including obesity. We discussed intensive lifestyle modifications today with an emphasis on specific weight loss instructions and strategies.   Repetitive spaced learning was employed today to elicit superior memory formation and behavioral change.  4. Obesity with current BMI of 31.5 Tracey [name] is currently in the action stage of change. As such, her goal is to continue with weight loss efforts. She has agreed to keeping a food journal and adhering to recommended goals of 1200-1600 calories and 90+ grams of protein daily.   Exercise goals: As is.  Behavioral modification strategies: increasing lean protein intake and holiday eating strategies .  Tracey [name] has agreed to follow-up with our clinic in 4 weeks. She was informed of the importance of frequent follow-up visits to maximize her success with intensive lifestyle modifications for her multiple health conditions.   Objective:   Blood pressure 125/80, pulse 68, temperature 98.1 F (36.7 C), height 5\' 7"  (1.702 m), weight 201 lb (91.2 kg), SpO2 100 %. Body mass index is 31.48 kg/m.  General: Cooperative, alert, well developed, in no acute distress. HEENT: Conjunctivae and lids unremarkable. Cardiovascular: Regular rhythm.  Lungs: Normal work of breathing. Neurologic: No focal deficits.   Lab Results  Component Value Date   CREATININE 1.04 (H) 07/21/2020   BUN 9 07/21/2020   NA 140 07/21/2020   K 4.0 07/21/2020   CL 105 07/21/2020   CO2 21 07/21/2020   Lab  Results  Component Value Date   ALT 15 07/21/2020   AST 13 07/21/2020   ALKPHOS 59 07/21/2020   BILITOT 0.5 07/21/2020   Lab Results  Component Value Date   HGBA1C 5.5 07/21/2020   HGBA1C 5.1 04/03/2020   HGBA1C 5.1 11/10/2017   HGBA1C 5.6 06/04/2017   Lab Results  Component Value  Date   INSULIN 16.6 07/21/2020   Lab Results  Component Value Date   TSH 1.130 07/21/2020   Lab Results  Component Value Date   CHOL 102 07/21/2020   HDL 40 07/21/2020   LDLCALC 51 07/21/2020   TRIG 40 07/21/2020   CHOLHDL 4.0 04/03/2020   Lab Results  Component Value Date   VD25OH 59.1 07/21/2020   VD25OH 26.2 (L) 06/04/2017   VD25OH 14.3 (L) 06/23/2015   Lab Results  Component Value Date   WBC 6.5 07/21/2020   HGB 13.9 07/21/2020   HCT 43.0 07/21/2020   MCV 89 07/21/2020   PLT 279 07/21/2020   No results found for: IRON, TIBC, FERRITIN  Attestation Statements:   Reviewed by clinician on day of visit: allergies, medications, problem list, medical history, surgical history, family history, social history, and previous encounter notes.   I, Burt Knack, am acting as transcriptionist for Quillian Quince, MD.  I have reviewed the above documentation for accuracy and completeness, and I agree with the above. -  Quillian Quince, MD

## 2021-02-01 ENCOUNTER — Other Ambulatory Visit (INDEPENDENT_AMBULATORY_CARE_PROVIDER_SITE_OTHER): Payer: Self-pay | Admitting: Family Medicine

## 2021-02-01 DIAGNOSIS — E7849 Other hyperlipidemia: Secondary | ICD-10-CM

## 2021-02-01 NOTE — Telephone Encounter (Signed)
LAST APPOINTMENT DATE: 01/19/21 NEXT APPOINTMENT DATE: 02/17/21   [facility] #[id number] [place], [place] - [address] AT [facility] [address] [facility] [address] [place] [address] Phone: [phone] Fax: [phone]  [phone] [place], [place] - [address] [place] [address] Phone: [phone] Fax: [phone]  [facility] - [place], [place] - [address], [address], [address] [place] [address] Phone: [phone] Fax: [phone]  [facility] - [place], [place] - [address] [place] [address] Phone: [phone] Fax: [phone]  Patient is requesting a refill of the following medications: Requested Prescriptions   Pending Prescriptions Disp Refills   rosuvastatin (CRESTOR) 10 MG tablet [Pharmacy Med Name: ROSUVASTATIN 10MG  TABLETS] 30 tablet 0    Sig: TAKE 1 TABLET(10 MG) BY MOUTH DAILY    Date last filled: 12/29/20 Previously prescribed by Dr. 02/28/21  Lab Results  Component Value Date   HGBA1C 5.5 07/21/2020   HGBA1C 5.1 04/03/2020   HGBA1C 5.1 11/10/2017   Lab Results  Component Value Date   LDLCALC 51 07/21/2020   CREATININE 1.04 (H) 07/21/2020   Lab Results  Component Value Date   VD25OH 59.1 07/21/2020   VD25OH 26.2 (L) 06/04/2017   VD25OH 14.3 (L) 06/23/2015    BP Readings from Last 3 Encounters:  01/19/21 125/80  01/03/21 130/83  12/30/20 118/82

## 2021-02-17 ENCOUNTER — Ambulatory Visit (INDEPENDENT_AMBULATORY_CARE_PROVIDER_SITE_OTHER): Payer: 59 | Admitting: Family Medicine

## 2021-02-21 ENCOUNTER — Ambulatory Visit: Payer: 59 | Admitting: Internal Medicine

## 2021-02-23 ENCOUNTER — Other Ambulatory Visit: Payer: Self-pay | Admitting: Internal Medicine

## 2021-02-23 DIAGNOSIS — F3289 Other specified depressive episodes: Secondary | ICD-10-CM

## 2021-02-27 ENCOUNTER — Other Ambulatory Visit (INDEPENDENT_AMBULATORY_CARE_PROVIDER_SITE_OTHER): Payer: Self-pay | Admitting: Family Medicine

## 2021-02-27 DIAGNOSIS — F3289 Other specified depressive episodes: Secondary | ICD-10-CM

## 2021-02-28 ENCOUNTER — Other Ambulatory Visit (INDEPENDENT_AMBULATORY_CARE_PROVIDER_SITE_OTHER): Payer: Self-pay | Admitting: Family Medicine

## 2021-02-28 DIAGNOSIS — F3289 Other specified depressive episodes: Secondary | ICD-10-CM

## 2021-02-28 MED ORDER — SERTRALINE HCL 25 MG PO TABS: 25.0000 mg | ORAL_TABLET | Freq: Every day | ORAL | 0 refills | Status: DC

## 2021-02-28 MED ORDER — BUPROPION HCL ER (SR) 150 MG PO TB12: 150.0000 mg | ORAL_TABLET | Freq: Every day | ORAL | 0 refills | Status: DC

## 2021-02-28 NOTE — Telephone Encounter (Signed)
LAST APPOINTMENT DATE: 01/19/21 NEXT APPOINTMENT DATE: None scheduled   [facility] #[id number] [place], [place] - [address] AT [facility] OF [address] & [facility] [address] [place] [address] Phone: [phone] Fax: [phone]  [facility] [address] [place], [place] - [address] [place] [address] Phone: [phone] Fax: [phone]  [facility] - [place], [place] - [address], [address] [place] [address] Phone: [phone] Fax: [phone]  [facility] - [place], [place] - [address] [place] [address] Phone: [phone] Fax: [phone]  Patient is requesting a refill of the following medications: Requested Prescriptions   Pending Prescriptions Disp Refills   buPROPion (WELLBUTRIN SR) 150 MG 12 hr tablet 30 tablet 0    Sig: Take 1 tablet (150 mg total) by mouth daily. Take in the morning.   sertraline (ZOLOFT) 25 MG tablet 30 tablet 0    Sig: Take 1 tablet (25 mg total) by mouth daily.    Date last filled: 01/19/21 Previously prescribed by Dr. Dalbert Garnet  Lab Results  Component Value Date   HGBA1C 5.5 07/21/2020   HGBA1C 5.1 04/03/2020   HGBA1C 5.1 11/10/2017   Lab Results  Component Value Date   LDLCALC 51 07/21/2020   CREATININE 1.04 (H) 07/21/2020   Lab Results  Component Value Date   VD25OH 59.1 07/21/2020   VD25OH 26.2 (L) 06/04/2017   VD25OH 14.3 (L) 06/23/2015    BP Readings from Last 3 Encounters:  01/19/21 125/80  01/03/21 130/83  12/30/20 118/82

## 2021-03-01 ENCOUNTER — Ambulatory Visit: Payer: 59 | Admitting: Internal Medicine

## 2021-03-02 ENCOUNTER — Other Ambulatory Visit: Payer: Self-pay

## 2021-03-02 MED ORDER — LISINOPRIL 20 MG PO TABS
30.0000 mg | ORAL_TABLET | Freq: Every day | ORAL | 3 refills | Status: DC
Start: 2021-03-02 — End: 2021-04-25

## 2021-03-15 ENCOUNTER — Other Ambulatory Visit (INDEPENDENT_AMBULATORY_CARE_PROVIDER_SITE_OTHER): Payer: Self-pay | Admitting: Family Medicine

## 2021-03-15 ENCOUNTER — Other Ambulatory Visit: Payer: Self-pay

## 2021-03-15 DIAGNOSIS — E7849 Other hyperlipidemia: Secondary | ICD-10-CM

## 2021-03-15 MED ORDER — ROSUVASTATIN CALCIUM 10 MG PO TABS
10.0000 mg | ORAL_TABLET | Freq: Every day | ORAL | 0 refills | Status: DC
  Filled 2021-03-15: qty 30, 30d supply, fill #0

## 2021-03-16 ENCOUNTER — Other Ambulatory Visit: Payer: Self-pay

## 2021-03-28 ENCOUNTER — Other Ambulatory Visit (HOSPITAL_COMMUNITY): Payer: Self-pay

## 2021-03-28 ENCOUNTER — Other Ambulatory Visit (INDEPENDENT_AMBULATORY_CARE_PROVIDER_SITE_OTHER): Payer: Self-pay | Admitting: Family Medicine

## 2021-03-28 DIAGNOSIS — F3289 Other specified depressive episodes: Secondary | ICD-10-CM

## 2021-03-28 MED ORDER — SERTRALINE HCL 25 MG PO TABS
25.0000 mg | ORAL_TABLET | Freq: Every day | ORAL | 0 refills | Status: DC
  Filled 2021-03-28: qty 30, 30d supply, fill #0

## 2021-03-28 MED ORDER — BUPROPION HCL ER (SR) 150 MG PO TB12
150.0000 mg | ORAL_TABLET | Freq: Every day | ORAL | 0 refills | Status: DC
  Filled 2021-03-28: qty 30, 30d supply, fill #0

## 2021-03-28 NOTE — Telephone Encounter (Signed)
LAST APPOINTMENT DATE: 01/19/21 NEXT APPOINTMENT DATE: 04/12/21   [facility] STORE [id number] [place], [place] [address] AT [place] [address] Phone: [phone] Fax: [phone]  [facility] [address], [place] - [place] [id number] [place] [id number] Phone: [phone] Fax: [phone]  [facility] [place] [place], [place] [address] [place] [id number], [place] [id number] [place] [address] Phone: [phone] Fax: [place], [place] [address] Phone: [phone] Fax: [phone]  Patient is requesting a refill of the following medications: Requested Prescriptions   Pending Prescriptions Disp Refills   sertraline (ZOLOFT) 25 MG tablet 30 tablet 0    Sig: Take 1 tablet (25 mg total) by mouth daily.    Date last filled: 02/28/21 Previously prescribed by Dr. Leafy Ro  Lab Results  Component Value Date   HGBA1C 5.5 07/21/2020   HGBA1C 5.1 04/03/2020   HGBA1C 5.1 11/10/2017   Lab Results  Component Value Date   LDLCALC 51 07/21/2020   CREATININE 1.04 (H) 07/21/2020   Lab Results  Component Value Date   VD25OH 59.1 07/21/2020   VD25OH 26.2 (L) 06/04/2017   VD25OH 14.3 (L) 06/23/2015    BP Readings from Last 3 Encounters:  01/19/21 125/80  01/03/21 130/83  12/30/20 118/82

## 2021-03-28 NOTE — Telephone Encounter (Signed)
LAST APPOINTMENT DATE: 01/19/21 NEXT APPOINTMENT DATE: 04/12/21   [facility] #[id number] [place], [place] - [address] AT [facility] [address] & [facility] [address] [place] [address] Phone: [phone] Fax: [phone]  [facility] [address] [place] - [address] [place] [address] Phone: [phone] Fax: [phone]  [facility] - [place], [place] - [address], [address], [address] [place] [address] Phone: [phone] Fax: [phone]  [facility] - [place], [place] - [address] [place] [address] Phone: [phone] Fax: [phone]  Patient is requesting a refill of the following medications: Requested Prescriptions   Pending Prescriptions Disp Refills   buPROPion (WELLBUTRIN SR) 150 MG 12 hr tablet 30 tablet 0    Sig: Take 1 tablet (150 mg total) by mouth daily. Take in the morning.    Date last filled: 02/28/21 Previously prescribed by Dr. Dalbert Garnet  Lab Results  Component Value Date   HGBA1C 5.5 07/21/2020   HGBA1C 5.1 04/03/2020   HGBA1C 5.1 11/10/2017   Lab Results  Component Value Date   LDLCALC 51 07/21/2020   CREATININE 1.04 (H) 07/21/2020   Lab Results  Component Value Date   VD25OH 59.1 07/21/2020   VD25OH 26.2 (L) 06/04/2017   VD25OH 14.3 (L) 06/23/2015    BP Readings from Last 3 Encounters:  01/19/21 125/80  01/03/21 130/83  12/30/20 118/82

## 2021-04-01 ENCOUNTER — Other Ambulatory Visit (HOSPITAL_COMMUNITY): Payer: Self-pay

## 2021-04-04 ENCOUNTER — Ambulatory Visit: Payer: 59 | Admitting: Internal Medicine

## 2021-04-05 ENCOUNTER — Telehealth (INDEPENDENT_AMBULATORY_CARE_PROVIDER_SITE_OTHER): Payer: 59 | Admitting: Adult Health

## 2021-04-05 ENCOUNTER — Telehealth: Payer: Self-pay | Admitting: Adult Health

## 2021-04-05 ENCOUNTER — Encounter: Payer: Self-pay | Admitting: Adult Health

## 2021-04-05 DIAGNOSIS — I639 Cerebral infarction, unspecified: Secondary | ICD-10-CM | POA: Diagnosis not present

## 2021-04-05 NOTE — Telephone Encounter (Signed)
Noted  

## 2021-04-05 NOTE — Telephone Encounter (Signed)
..   Pt understands that although there may be some limitations with this type of visit, we will take all precautions to reduce any security or privacy concerns.  Pt understands that this will be treated like an in office visit and we will file with pt's insurance, and there may be a patient responsible charge related to this service. ? ?

## 2021-04-05 NOTE — Telephone Encounter (Signed)
Pt has called to provide her new insurance information [employer] [id number] [phone]

## 2021-04-05 NOTE — Progress Notes (Signed)
[facility] [address] [place]. [place] [address] [phone]       STROKE FOLLOW UP NOTE  [name] Date of Birth:  1976-02-05 Medical Record Number:  [id number]   Reason for Referral: stroke follow up  Virtual Visit via Video Note  Virtual visit completed through [facility], a video enabled telemedicine application. Due to national recommendations of social distancing due to COVID-19, a virtual visit is felt to be most appropriate for this patient at this time. Reviewed limitations, risks, security and privacy concerns of performing a virtual visit and the availability of in person appointments. I also reviewed that there may be a patient responsible charge related to this service. The patient agreed to proceed.    Patient location: home Provider location: in office, [facility] Persons participating in this virtual visit: patient     SUBJECTIVE:    HPI:   Update 04/05/2021 JM: patient being seen for 5 month stroke follow up via MyChart VV per pt request. Overall stable from stroke standpoint without new stroke/TIA symptoms.  Residual occasional word finding difficulty and occassional short term memory difficulties stable.  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 C/o worsening vision since stroke - difficulty seeing further away.  Compliant on aspirin and Crestor without side effects. C/o worsening insomnia over the past 1.5 months. Will fall asleep with TV on. Will wake up around 1am and have difficulty falling back asleep. On Lunesta 2mg  nightly but difficulty tolerating due to morning grogginess.  No further concerns at this time.    History provided for reference purposes only Update 11/03/2020 JM: [name] is being seen for 3 month stroke follow up unaccompanied. Overall stable.  Speech has been greatly improving since  prior visit.  Occasional word finding difficulty more so when speaking to those she is not familiar with or with increased anxiety.  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  Management has been very supportive and has been slowly relearning prior job duties and training as needed.  Also reports fluctuation of left hand fingertips and tips of toes on left foot numbness especially in the evening while relaxing - present since stroke onset - not new.  Recently started on sertraline for anxiety worsened post stroke.  denies new stroke/TIA symptoms. Compliant on aspirin, Plavix and Crestor-denies associated side effects.  Blood pressure today 139/88.  Being followed by healthy weight and wellness with a 5 pound weight loss since May.  Followed by Dr. Brett Fairy for insomnia on Lunesta with benefit.  No further concerns at this time.  Update 08/02/2020 JM: [name] returns for 93-month stroke follow-up accompanied by her 1 yo daughter  Doing well since prior visit from stroke standpoint without new stroke/TIA symptoms.  Residual cognitive impairment gradually improving with greatest difficulty with recall or getting words together especially when talking to somebody she does not know or with increased anxiety (per SLP difficulties with thought organization and managing some day-to-day tasks).  She continues to work routinely with neuro rehab SLP.  She remains on short term disability and is eager to return back to work at [employer].   Reports compliance on aspirin, plavix (post PFO closure) and Crestor without associated side effects Blood pressure today 135/83  S/p PFO closure [id number] without complication Sleep study 06/13/2020 negative for sleep apnea (although only captured  3.5 hours of sleep however all sleep stages were captured) -chronic history of insomnia and migraines recently started on Lunesta and Ubrelvy per PCP with  benefit.  Also on bupropion for depression/anxiety.  No further concerns at this time  Initial visit 05/03/2020 JM: [name] is being seen for hospital follow-up accompanied by her husband.  She reports gradual improvement since discharge with residual LLE weakness (feels like knee wants to hyperextend), gait impairment and cognitive impairment.  Currently working with PT and has initial evaluation with SLP tomorrow for cognitive therapy.  She has not returned back to work currently on short-term disability working for [employer].  Baseline depression/anxiety with worsening anxiety and depression but also self discontinued bupropion at hospital discharge as she was fearful of possible increased stroke risk with continued use.  Reports increased fatigue since her stroke with fatigue PTA as well as snoring, witnessed apnea and insomnia.  She has not previously underwent sleep study.  Denies new stroke/TIA symptoms.  She has completed 3 weeks DAPT and remains on aspirin alone without bleeding or bruising.  Remains on Crestor 10 mg daily without myalgias.  Blood pressure today 149/77.  She does admit to smoking 1 cigarette/day with goal of complete tobacco cessation in the near future.  TEE completed 04/16/2020 which showed evidence of PFO.  Initial evaluation with Dr. Burt Knack for possible closure scheduled on 05/28/2020.  Hypercoagulable labs negative.  No further concerns at this time.  Stroke admission 04/02/2020 [name] is a 46 y.o. female with history of hypertension, anxiety (panic attacks), headaches (migraines), ongoing tobacco use, and depression (just started Wellbutrin on the day of admission)  who presented to the [facility] emergency department  on 04/02/2020 and seen by teleNeurology for acute onset neck discomfort, left leg numbness / weakness, left arm numbness and a pressure sensation at the base of her posterior neck in the midline.   Personally reviewed  hospitalization pertinent progress notes, lab work and imaging with summary provided.  Shortly transferred to [facility] evaluated by Dr. Leonie Man with stroke work-up revealing small acute infarct in the juxtacortical high right posterior frontal lobe, possibly embolic secondary to unknown source.  Recommend completion of TEE outpatient.  Hypercoagulable labs pending at discharge.  Recommended DAPT for 3 weeks and aspirin alone. LDL 101 and initiated Crestor 10 mg daily.  A1c 5.1.  Other stroke risk factors include current tobacco use, obesity and history of migraines but no prior stroke history.  Other active problems include suspected sleep apnea, bradycardia and hypokalemia.  Evaluated by therapies and recommended outpatient PT and discharged home in stable condition.  Stroke: Small acute infarct in the juxtacortical high right posterior frontal lobe possibly embolic - etiology unknown. Resultant mild left leg weakness and numbness which is resolving Code Stroke CT Head -  Normal head CT. ASPECTS is 10.     CT head - not ordered MRI head - Small acute infarct in the juxtacortical high right posterior frontal lobe. Minimal edema without mass effect. Additional mild scattered T2/FLAIR hyperintensities within the white matter, mildly advanced for age. These are nonspecific and could relate to chronic microvascular ischemic disease, prior migraines, trauma, demyelination, or inflammation. CTA H&N - normal 2D Echo - EF 55 - 60%. No cardiac source of emboli identified.  Sars Corona Virus 2 - negative LDL - 101 HgbA1c - 5.1 UDS - negative VTE prophylaxis - Lovenox No antithrombotic prior to admission, now on aspirin 81 mg daily and clopidogrel 75 mg  daily for 3 weeks then aspirin alone Patient will be counseled to be compliant with her antithrombotic medications Ongoing aggressive stroke risk factor management Therapy recommendations:  Outpt PT Disposition:  Home     ROS:   14 system review of systems  performed and negative with exception of those listed in HPI  PMH:  Past Medical History:  Diagnosis Date   Anxiety    Bronchitis    Carpal tunnel syndrome on both sides    Constipation    Depression    Family history of adverse reaction to anesthesia    mother had problems waking up from surgery.   Gestational diabetes    Gestational diabetes mellitus    Normal 2 hr GTT postpartum   Headache    migraines   High cholesterol    Hypertension    Infertility, female    Lactose intolerance    Oligohydramnios antepartum 11/09/2017   Palpitations    Panic attack 2017   diagnosed 3 months ago. no meds presently   Reflux    did not filll prescription   S/P percutaneous patent foramen ovale closure 06/16/2020   s/p PFO occluder device with a a 25 MM AMPLATZER by Dr. Burt Knack   Stroke [facility])    Swelling    Vitamin D deficiency     PSH:  Past Surgical History:  Procedure Laterality Date   BREAST BIOPSY  right    cyst   BUBBLE STUDY  04/16/2020   Procedure: BUBBLE STUDY;  Surgeon: Skeet Latch, MD;  Location: [facility];  Service: Cardiovascular;;   DILATION AND EVACUATION N/A 07/26/2015   Procedure: DILATATION AND EVACUATION;  Surgeon: Shelly Bombard, MD;  Location: [facility];  Service: Gynecology;  Laterality: N/A;   PATENT FORAMEN OVALE(PFO) CLOSURE N/A 06/16/2020   Procedure: PATENT FORAMEN OVALE (PFO) CLOSURE;  Surgeon: Sherren Mocha, MD;  Location: [facility];  Service: Cardiovascular;  Laterality: N/A;   TEE WITHOUT CARDIOVERSION N/A 04/16/2020   Procedure: TRANSESOPHAGEAL ECHOCARDIOGRAM (TEE);  Surgeon: Skeet Latch, MD;  Location: [facility];  Service: Cardiovascular;  Laterality: N/A;    Social History:  Social History   Socioeconomic History   Marital status: Married    Spouse name: Not on file   Number of children: Not on file   Years of education: Not on file   Highest education level: Not on file  Occupational History   Occupation: Not Working   Tobacco Use   Smoking status: Every Day    Packs/day: 0.25    Types: Cigarettes   Smokeless tobacco: Never   Tobacco comments:    4 cig/day  Vaping Use   Vaping Use: Never used  Substance and Sexual Activity   Alcohol use: No    Alcohol/week: 0.0 standard drinks   Drug use: No   Sexual activity: Yes    Birth control/protection: I.U.D.  Other Topics Concern   Not on file  Social History Narrative   Lives at home with spouse and her 2 children   Right handed   Caffeine: 2 cups/day   Social Determinants of Health   Financial Resource Strain: Low Risk    Difficulty of Paying Living Expenses: Not hard at all  Food Insecurity: No Food Insecurity   Worried About Charity fundraiser in the Last Year: Never true   [place] in the Last Year: Never true  Transportation Needs: No Transportation Needs   Lack of Transportation (Medical): No   Lack of Transportation (  Non-Medical): No  Physical Activity: Sufficiently Active   Days of Exercise per Week: 5 days   Minutes of Exercise per Session: 30 min  Stress: Not on file  Social Connections: Not on file  Intimate Partner Violence: Not on file    Family History:  Family History  Problem Relation Age of Onset   Hypertension Mother    Diabetes Maternal Aunt    Breast cancer Maternal Aunt    Diabetes Maternal Uncle    Anxiety disorder Father    Depression Father    Alcoholism Father     Medications:   Current Outpatient Medications on File Prior to Visit  Medication Sig Dispense Refill   buPROPion (WELLBUTRIN SR) 150 MG 12 hr tablet Take 1 tablet (150 mg total) by mouth daily in the morning. 30 tablet 0   aspirin 81 MG EC tablet Take 81 mg by mouth daily. Swallow whole.     eszopiclone (LUNESTA) 2 MG TABS tablet Take 1 tablet (2 mg total) by mouth at bedtime as needed for sleep. Take immediately before bedtime 90 tablet 1   levonorgestrel (MIRENA, 52 MG,) 20 MCG/24HR IUD 1 Intra Uterine Device (1 each total) by  Intrauterine route once for 1 dose. 1 each 0   lisinopril (ZESTRIL) 20 MG tablet Take 1.5 tablets (30 mg total) by mouth daily. 135 tablet 3   Multiple Vitamins-Minerals (MULTIVITAMIN WITH MINERALS) tablet Take 1 tablet by mouth daily.     Probiotic Product (PROBIOTIC DAILY PO) Take 1 tablet by mouth daily.      rosuvastatin (CRESTOR) 10 MG tablet Take 1 tablet (10 mg total) by mouth daily. 30 tablet 0   sertraline (ZOLOFT) 25 MG tablet Take 1 tablet (25 mg total) by mouth daily. 30 tablet 0   UBRELVY 100 MG TABS TAKE ONE (1) TABLET BY MOUTH DAILY AS NEEDED. 30 tablet 1   No current facility-administered medications on file prior to visit.    Allergies:  No Known Allergies    OBJECTIVE:  Physical Exam General: well developed, well nourished, pleasant middle-age African-American female, seated, in no evident distress Head: head normocephalic and atraumatic.    Neurologic Exam Mental Status: Awake and fully alert.  Occasional speech hesitancy.  Oriented to place and time. Recent and remote memory intact. Attention span, concentration and fund of knowledge appropriate. Mood and affect appropriate.  Cranial Nerves: Extraocular movements full without nystagmus. Hearing intact to voice. Facial sensation intact. Face, tongue, palate moves normally and symmetrically.  Shoulder shrug symmetric. Motor: No evidence of weakness per drift assessment Coordination: Rapid alternating movements normal in all extremities. Finger-to-nose and heel-to-shin performed accurately bilaterally.       ASSESSMENT: Tracey [name] is a 46 y.o. year old female with small acute infarct in the juxtacortical high right posterior frontal lobe on [id number] possibly embolic secondary to unknown source although evidence of PFO. Vascular risk factors include HTN, HLD, tobacco use, obesity, migraines and PFO     PLAN:  R frontal lobe stroke, cryptogenic:  Residual deficit: Cognitive communication deficit  -stable Worsening vision - present since stroke. Unsure if actually stroke related or coincidental as reports difficulty seeing objects further way.  Advised to follow-up with her eye doctor for evaluation PFO s/p closure 06/16/2020 Hypercoagulable labs and 30-day cardiac event monitor negative Continue aspirin 81 mg daily  and Crestor 10 mg daily for secondary stroke prevention.   Discussed secondary stroke prevention measures and importance of close PCP follow up for aggressive stroke risk factor management  PFO: As evidenced on TEE 04/16/2020 S/p closure 06/16/2020 for ROPE score 5 indicating 34% chance that stroke was due to PFO Followed by cardiology HTN: BP goal <130/90.  Stable on current regimen per PCP HLD: LDL goal <70. Prior LDL 51 (07/2020) on Crestor 10 mg daily managed by PCP Chronic insomnia: Followed by Dr. Brett Fairy on [place]. C/o worsening past 1.5 months and difficulty tolerating Lunesta due to grogginess the following day.  Discussed improving sleep habits and good sleep hygiene.  If persists, advised her to schedule a f/u visit with Dr. Alben Deeds from stroke standpoint -follow-up as needed   CC:  [place] provider: Dr. Lenda Kelp, Arvid Right, MD    I spent 38 minutes of face-to-face and non-face-to-face time with patient via [place] video visit.  This included previsit chart review, lab review, study review, electronic health record documentation, patient education and discussion regarding history of prior stroke and residual deficits, secondary stroke prevention measures and aggressive stroke risk factor management, other concerns as noted above and answered all other questions to patient satisfaction   Frann Rider, AGNP-BC  [facility] [address] [place], [place] [address]  Phone [phone] Fax [phone] Note: This document was prepared with digital dictation and possible smart phrase technology. Any transcriptional  errors that result from this process are unintentional.

## 2021-04-12 ENCOUNTER — Ambulatory Visit (INDEPENDENT_AMBULATORY_CARE_PROVIDER_SITE_OTHER): Payer: 59 | Admitting: Family Medicine

## 2021-04-12 ENCOUNTER — Encounter (INDEPENDENT_AMBULATORY_CARE_PROVIDER_SITE_OTHER): Payer: Self-pay | Admitting: Family Medicine

## 2021-04-12 ENCOUNTER — Other Ambulatory Visit: Payer: Self-pay

## 2021-04-12 VITALS — BP 132/80 | HR 81 | Temp 98.3°F | Ht 67.0 in | Wt 205.0 lb

## 2021-04-12 DIAGNOSIS — F3289 Other specified depressive episodes: Secondary | ICD-10-CM | POA: Diagnosis not present

## 2021-04-12 DIAGNOSIS — Z6832 Body mass index (BMI) 32.0-32.9, adult: Secondary | ICD-10-CM | POA: Diagnosis not present

## 2021-04-12 DIAGNOSIS — E7849 Other hyperlipidemia: Secondary | ICD-10-CM

## 2021-04-12 DIAGNOSIS — E669 Obesity, unspecified: Secondary | ICD-10-CM | POA: Diagnosis not present

## 2021-04-12 MED ORDER — SERTRALINE HCL 25 MG PO TABS: 25.0000 mg | ORAL_TABLET | Freq: Every day | ORAL | 0 refills | Status: DC

## 2021-04-12 MED ORDER — BUPROPION HCL ER (SR) 150 MG PO TB12: 150.0000 mg | ORAL_TABLET | Freq: Every day | ORAL | 0 refills | Status: DC

## 2021-04-12 MED ORDER — ROSUVASTATIN CALCIUM 10 MG PO TABS: 10.0000 mg | ORAL_TABLET | Freq: Every day | ORAL | 0 refills | Status: DC

## 2021-04-12 NOTE — Progress Notes (Signed)
Chief Complaint:   OBESITY Tracey [name] is here to discuss her progress with her obesity treatment plan along with follow-up of her obesity related diagnoses. Tracey [name] is on keeping a food journal and adhering to recommended goals of 1200-1600 calories and 90+ grams protein and states she is following her eating plan approximately 50% of the time. Tracey [name] states she is not currently exercising.  Today's visit was #: 9 Starting weight: 209 lbs Starting date: 07/21/2020 Today's weight: 205 lbs Today's date: 04/12/2021 Total lbs lost to date: 4 Total lbs lost since last in-office visit: 0  Interim History: Pt has not been eating well- just adopted a baby. She is stress eating and eating cravings. She got her new baby 4 days ago. Pt is definitely feeling the help of sertraline. She is looking at the next few weeks journaling will be easiest. Pt has started digital planning.  Subjective:   1. Other hyperlipidemia Pt is on Crestor with no side effects or myalgias.  2. Other depression Pt denies suicidal or homicidal ideations. She is on Zoloft and Wellbutrin.  Assessment/Plan:   1. Other hyperlipidemia Cardiovascular risk and specific lipid/LDL goals reviewed.  We discussed several lifestyle modifications today and [place] will continue to work on diet, exercise and weight loss efforts. Orders and follow up as documented in patient record.   Counseling Intensive lifestyle modifications are the first line treatment for this issue. Dietary changes: Increase soluble fiber. Decrease simple carbohydrates. Exercise changes: Moderate to vigorous-intensity aerobic activity 150 minutes per week if tolerated. Lipid-lowering medications: see documented in medical record.  Refill- rosuvastatin (CRESTOR) 10 MG tablet; Take 1 tablet (10 mg total) by mouth daily.  Dispense: 90 tablet; Refill: 0  2. Other depression Behavior modification techniques were discussed today to help Tracey [name] deal with her  emotional/non-hunger eating behaviors.  Orders and follow up as documented in patient record.   Refill- sertraline (ZOLOFT) 25 MG tablet; Take 1 tablet (25 mg total) by mouth daily.  Dispense: 90 tablet; Refill: 0  Refill- buPROPion (WELLBUTRIN SR) 150 MG 12 hr tablet; Take 1 tablet (150 mg total) by mouth daily in the morning.  Dispense: 90 tablet; Refill: 0  3. Obesity with current BMI of 32.1  Tracey [name] is currently in the action stage of change. As such, her goal is to continue with weight loss efforts. She has agreed to keeping a food journal and adhering to recommended goals of 1500-1700 calories and 95+ grams protein.   Exercise goals: No exercise has been prescribed at this time.  Behavioral modification strategies: increasing lean protein intake, meal planning and cooking strategies, keeping healthy foods in the home, planning for success, and keeping a strict food journal.  Tracey [name] has agreed to follow-up with our clinic in 3 weeks. She was informed of the importance of frequent follow-up visits to maximize her success with intensive lifestyle modifications for her multiple health conditions.   Objective:   Blood pressure 132/80, pulse 81, temperature 98.3 F (36.8 C), height 5\' 7"  (1.702 m), weight 205 lb (93 kg), SpO2 100 %. Body mass index is 32.11 kg/m.  General: Cooperative, alert, well developed, in no acute distress. HEENT: Conjunctivae and lids unremarkable. Cardiovascular: Regular rhythm.  Lungs: Normal work of breathing. Neurologic: No focal deficits.   Lab Results  Component Value Date   CREATININE 1.04 (H) 07/21/2020   BUN 9 07/21/2020   NA 140 07/21/2020   K 4.0 07/21/2020   CL 105 07/21/2020   CO2 21 07/21/2020  Lab Results  Component Value Date   ALT 15 07/21/2020   AST 13 07/21/2020   ALKPHOS 59 07/21/2020   BILITOT 0.5 07/21/2020   Lab Results  Component Value Date   HGBA1C 5.5 07/21/2020   HGBA1C 5.1 04/03/2020   HGBA1C 5.1 11/10/2017   HGBA1C  5.6 06/04/2017   Lab Results  Component Value Date   INSULIN 16.6 07/21/2020   Lab Results  Component Value Date   TSH 1.130 07/21/2020   Lab Results  Component Value Date   CHOL 102 07/21/2020   HDL 40 07/21/2020   LDLCALC 51 07/21/2020   TRIG 40 07/21/2020   CHOLHDL 4.0 04/03/2020   Lab Results  Component Value Date   VD25OH 59.1 07/21/2020   VD25OH 26.2 (L) 06/04/2017   VD25OH 14.3 (L) 06/23/2015   Lab Results  Component Value Date   WBC 6.5 07/21/2020   HGB 13.9 07/21/2020   HCT 43.0 07/21/2020   MCV 89 07/21/2020   PLT 279 07/21/2020   Attestation Statements:   Reviewed by clinician on day of visit: allergies, medications, problem list, medical history, surgical history, family history, social history, and previous encounter notes.  Edmund Hilda, CMA, am acting as transcriptionist for Reuben Likes, MD.  I have reviewed the above documentation for accuracy and completeness, and I agree with the above. - Reuben Likes, MD

## 2021-04-18 ENCOUNTER — Ambulatory Visit: Payer: 59 | Admitting: Internal Medicine

## 2021-04-21 ENCOUNTER — Telehealth (INDEPENDENT_AMBULATORY_CARE_PROVIDER_SITE_OTHER): Payer: Self-pay

## 2021-04-21 NOTE — Telephone Encounter (Signed)
Erika with [facility] is requesting prescriptions for Bupropion, Sertraline and Rosuvastatin be sent for patient.  She stated that a fax request was sent. Thank you

## 2021-04-21 NOTE — Telephone Encounter (Signed)
Please see message . Thank you .

## 2021-04-25 ENCOUNTER — Other Ambulatory Visit: Payer: Self-pay | Admitting: Nurse Practitioner

## 2021-04-25 ENCOUNTER — Encounter (INDEPENDENT_AMBULATORY_CARE_PROVIDER_SITE_OTHER): Payer: Self-pay

## 2021-04-25 ENCOUNTER — Telehealth: Payer: Self-pay | Admitting: Cardiovascular Disease

## 2021-04-25 ENCOUNTER — Other Ambulatory Visit (INDEPENDENT_AMBULATORY_CARE_PROVIDER_SITE_OTHER): Payer: Self-pay

## 2021-04-25 DIAGNOSIS — E7849 Other hyperlipidemia: Secondary | ICD-10-CM

## 2021-04-25 DIAGNOSIS — E669 Obesity, unspecified: Secondary | ICD-10-CM

## 2021-04-25 DIAGNOSIS — Z1231 Encounter for screening mammogram for malignant neoplasm of breast: Secondary | ICD-10-CM

## 2021-04-25 DIAGNOSIS — F3289 Other specified depressive episodes: Secondary | ICD-10-CM

## 2021-04-25 MED ORDER — BUPROPION HCL ER (SR) 150 MG PO TB12: 150.0000 mg | ORAL_TABLET | Freq: Every day | ORAL | 0 refills | Status: DC

## 2021-04-25 MED ORDER — ROSUVASTATIN CALCIUM 10 MG PO TABS: 10.0000 mg | ORAL_TABLET | Freq: Every day | ORAL | 0 refills | Status: DC

## 2021-04-25 MED ORDER — LISINOPRIL 20 MG PO TABS: 30.0000 mg | ORAL_TABLET | Freq: Every day | ORAL | 3 refills | Status: DC

## 2021-04-25 MED ORDER — SERTRALINE HCL 25 MG PO TABS: 25.0000 mg | ORAL_TABLET | Freq: Every day | ORAL | 0 refills | Status: DC

## 2021-04-25 NOTE — Telephone Encounter (Signed)
Refill sent to [facility] pharmacy  

## 2021-04-25 NOTE — Telephone Encounter (Signed)
°*  STAT* If patient is at the pharmacy, call can be transferred to refill team.   1. Which medications need to be refilled? (please list name of each medication and dose if known) lisinopril (ZESTRIL) 20 MG tablet  2. Which pharmacy/location (including street and city if local pharmacy) is medication to be sent to? [facility] by [facility], [place]  3. Do they need a 30 day or 90 day supply? 90 day  Patient is changing th [place] and requests the prescription be sent to them.

## 2021-04-28 ENCOUNTER — Other Ambulatory Visit: Payer: Self-pay

## 2021-04-28 ENCOUNTER — Encounter (HOSPITAL_BASED_OUTPATIENT_CLINIC_OR_DEPARTMENT_OTHER): Payer: Self-pay | Admitting: Cardiovascular Disease

## 2021-04-28 ENCOUNTER — Ambulatory Visit (INDEPENDENT_AMBULATORY_CARE_PROVIDER_SITE_OTHER): Payer: BLUE CROSS/BLUE SHIELD | Admitting: Cardiovascular Disease

## 2021-04-28 VITALS — BP 136/84 | HR 79 | Ht 67.0 in | Wt 209.9 lb

## 2021-04-28 DIAGNOSIS — Q2112 Patent foramen ovale: Secondary | ICD-10-CM | POA: Diagnosis not present

## 2021-04-28 DIAGNOSIS — I639 Cerebral infarction, unspecified: Secondary | ICD-10-CM | POA: Diagnosis not present

## 2021-04-28 DIAGNOSIS — Z8774 Personal history of (corrected) congenital malformations of heart and circulatory system: Secondary | ICD-10-CM

## 2021-04-28 DIAGNOSIS — Z6832 Body mass index (BMI) 32.0-32.9, adult: Secondary | ICD-10-CM

## 2021-04-28 DIAGNOSIS — R002 Palpitations: Secondary | ICD-10-CM

## 2021-04-28 DIAGNOSIS — F419 Anxiety disorder, unspecified: Secondary | ICD-10-CM

## 2021-04-28 NOTE — Assessment & Plan Note (Addendum)
She is struggling with her anxiety right now.  She has a lot of life stressors including a new baby, a 44-year-old, and a 89 year old at home.  She also has a new job and struggles to keep up somewhat since her stroke.  She is going to focus on increasing her exercise.  She is also going to talk with her PCP about options for sleep.  She has been prescribed Lunesta but feels groggy when she takes it.  She is going to try taking 1/2 tablet instead of the full tablet.  She is also going to start back going to therapy.  She has reported palpitations.  However the palpitations only occur when she looks at the baby and only last for a few seconds at a time.  I think this is more anxiety related.  We will check TSH, CBC, CMP, and magnesium.  She is worn an ambulatory monitor in the past and has not had any significant arrhythmias.

## 2021-04-28 NOTE — Assessment & Plan Note (Signed)
Blood pressure slightly above her goal of 130/80 today.  She has been under a lot of stress lately.  She is going to focus on increasing her exercise.  We will refer her downstairs to [facility].  The goal is at least 150 minutes of exercise weekly.  Continue lisinopril for now.

## 2021-04-28 NOTE — Patient Instructions (Signed)
Medication Instructions:  Your physician recommends that you continue on your current medications as directed. Please refer to the Current Medication list given to you today.   *If you need a refill on your cardiac medications before your next appointment, please call your pharmacy*  Lab Work: TSH/FT/BMET/CBC/MAGNESIUM TODAY   If you have labs (blood work) drawn today and your tests are completely normal, you will receive your results only by: [place] (if you have MyChart) OR A paper copy in the mail If you have any lab test that is abnormal or we need to change your treatment, we will call you to review the results.   Testing/Procedures: NONE   Follow-Up: At [facility], you and your health needs are our priority.  As part of our continuing mission to provide you with exceptional heart care, we have created designated Provider Care Teams.  These Care Teams include your primary Cardiologist (physician) and Advanced Practice Providers (APPs -  Physician Assistants and Nurse Practitioners) who all work together to provide you with the care you need, when you need it.  We recommend signing up for the patient portal called "MyChart".  Sign up information is provided on this After Visit Summary.  MyChart is used to connect with patients for Virtual Visits (Telemedicine).  Patients are able to view lab/test results, encounter notes, upcoming appointments, etc.  Non-urgent messages can be sent to your provider as well.   To learn more about what you can do with MyChart, go to [email or web address].    Your next appointment:   2 month(s)  The format for your next appointment:   In Person  Provider:   Skeet Latch, MD, Laurann Montana, NP, or Coletta Memos, NP{  You have been referred to [facility], if you do not hear from them in 2 weeks please call the offie  Other Instructions Exercise recommendations: The American Heart Association recommends 150 minutes of moderate  intensity exercise weekly. Try 30 minutes of moderate intensity exercise 4-5 times per week. This could include walking, jogging, or swimming.

## 2021-04-28 NOTE — Assessment & Plan Note (Signed)
She underwent PFO closure with Dr. Excell Seltzer and is doing well.  Continue aspirin.

## 2021-04-28 NOTE — Assessment & Plan Note (Signed)
She has not had any recurrent symptoms.  Status post PFO closure device.  Continue aspirin and rosuvastatin.  Lipids are well controlled.  Increasing exercise as above.

## 2021-04-28 NOTE — Progress Notes (Incomplete)
Cardiology Office Note   Date:  04/28/2021   ID:  Tracey [name], DOB 1975-03-25, MRN [id number]  PCP:  Janith Lima, MD  Cardiologist:   Orion Crook   No chief complaint on file.    History of Present Illness: Tracey [name] is a 46 y.o. female with hypertension, stroke, PFO s/p closure, and prior tobacco abuse who presents for follow-up. She was diagnosed with hypertension after the birth of her youngest child in 2020.  She was started on lisinopril at that time. She presented to the hospital 03/2020 with an acute stroke with neck discomfort and left-sided numbness and weakness. She was found to have a small acute infarct in the right posterior frontal lobe. It was thought to be embolic. She followed up with Dr. Burt Knack and had a 25 mm Amplatzer occluder device implanted on 06/16/2020. Postprocedural echo was stable with no residual shunting. At the last appointment she was doing well but was struggling with weight loss. She was referred to the healthy weight and wellness clinic. She continues to follow-up with them regularly.  At her last appointment, she was doing well. Her blood pressure was well-controlled and she was losing weight with the Healthy Weight [facility] Wellness program. She called the office reporting palpitations.  Today, she is doing well. However, 2 nights ago, she felt a fluttering in her chest. She described the feeling as nervousness and trying to contain it. The episodes lasted for a couple seconds but occurred frequently throughout the day. The palpitations were less frequent yesterday. She just adopted her step-daughter's 28-month old baby. At work, she was recently promoted and she has to work on focusing because of her stroke history. The palpitations primarily occur when she is lying down or looking at the baby. She wonders if the palpitations are related to life stress. Recently, she endorses insomnia. She was given Lunesta but does not take it often because she wakes up  feeling groggy. She gets about 3 hours of sleep before waking up and not being able to go back to sleep. She has not been able to exercise recently and gained 4 lbs. However, she is trying to get back to routine. For diet, she is trying to eat better. Typically, she drinks two 12 oz mugs of coffee daily. She denies any chest pain, or shortness of breath, lightheadedness, headaches, syncope, orthopnea, PND, lower extremity edema or exertional symptoms.  Past Medical History:  Diagnosis Date   Anxiety    Bronchitis    Carpal tunnel syndrome on both sides    Constipation    Depression    Family history of adverse reaction to anesthesia    mother had problems waking up from surgery.   Gestational diabetes    Gestational diabetes mellitus    Normal 2 hr GTT postpartum   Headache    migraines   High cholesterol    Hypertension    Infertility, female    Lactose intolerance    Oligohydramnios antepartum 11/09/2017   Palpitations    Panic attack 2017   diagnosed 3 months ago. no meds presently   Reflux    did not filll prescription   S/P percutaneous patent foramen ovale closure 06/16/2020   s/p PFO occluder device with a a 25 MM AMPLATZER by Dr. Burt Knack   Stroke [facility])    Swelling    Vitamin D deficiency     Past Surgical History:  Procedure Laterality Date   BREAST BIOPSY  right  cyst   BUBBLE STUDY  04/16/2020   Procedure: BUBBLE STUDY;  Surgeon: Skeet Latch, MD;  Location: [facility];  Service: Cardiovascular;;   DILATION AND EVACUATION N/A 07/26/2015   Procedure: DILATATION AND EVACUATION;  Surgeon: Shelly Bombard, MD;  Location: [facility];  Service: Gynecology;  Laterality: N/A;   PATENT FORAMEN OVALE(PFO) CLOSURE N/A 06/16/2020   Procedure: PATENT FORAMEN OVALE (PFO) CLOSURE;  Surgeon: Sherren Mocha, MD;  Location: [place] CV LAB;  Service: Cardiovascular;  Laterality: N/A;   TEE WITHOUT CARDIOVERSION N/A 04/16/2020   Procedure: TRANSESOPHAGEAL ECHOCARDIOGRAM (TEE);   Surgeon: Skeet Latch, MD;  Location: [facility] ENDOSCOPY;  Service: Cardiovascular;  Laterality: N/A;     Current Outpatient Medications  Medication Sig Dispense Refill   aspirin 81 MG EC tablet Take 81 mg by mouth daily. Swallow whole.     buPROPion (WELLBUTRIN SR) 150 MG 12 hr tablet Take 1 tablet (150 mg total) by mouth daily in the morning. 90 tablet 0   eszopiclone (LUNESTA) 2 MG TABS tablet Take 1 tablet (2 mg total) by mouth at bedtime as needed for sleep. Take immediately before bedtime 90 tablet 1   levonorgestrel (MIRENA, 52 MG,) 20 MCG/24HR IUD 1 Intra Uterine Device (1 each total) by Intrauterine route once for 1 dose. 1 each 0   lisinopril (ZESTRIL) 20 MG tablet Take 1.5 tablets (30 mg total) by mouth daily. 135 tablet 3   Multiple Vitamins-Minerals (MULTIVITAMIN WITH MINERALS) tablet Take 1 tablet by mouth daily.     Probiotic Product (PROBIOTIC DAILY PO) Take 1 tablet by mouth daily.      rosuvastatin (CRESTOR) 10 MG tablet Take 1 tablet (10 mg total) by mouth daily. 90 tablet 0   sertraline (ZOLOFT) 25 MG tablet Take 1 tablet (25 mg total) by mouth daily. 90 tablet 0   UBRELVY 100 MG TABS TAKE ONE (1) TABLET BY MOUTH DAILY AS NEEDED. 30 tablet 1   No current facility-administered medications for this visit.    Allergies:   Patient has no known allergies.    Social History:  The patient  reports that she has been smoking cigarettes. She has been smoking an average of .25 packs per day. She has never used smokeless tobacco. She reports that she does not drink alcohol and does not use drugs.   Family History:  The patient's family history includes Alcoholism in her father; Anxiety disorder in her father; Breast cancer in her maternal aunt; Depression in her father; Diabetes in her maternal aunt and maternal uncle; Hypertension in her mother.    ROS:  Please see the history of present illness.    (+) Palpitations (+) Stress (+) Insomnia All other systems are reviewed and  negative.   PHYSICAL EXAM: VS:  BP 136/84 (BP Location: Right Arm, Patient Position: Sitting, Cuff Size: Normal)    Pulse 79    Ht 5\' 7"  (1.702 m)    Wt 209 lb 14.4 oz (95.2 kg)    BMI 32.87 kg/m  , BMI Body mass index is 32.87 kg/m. GENERAL:  Well appearing HEENT: Pupils equal round and reactive, fundi not visualized, oral mucosa unremarkable NECK:  No jugular venous distention, waveform within normal limits, carotid upstroke brisk and symmetric, no bruits, no thyromegaly LUNGS:  Clear to auscultation bilaterally HEART:  RRR.  PMI not displaced or sustained,S1 and S2 within normal limits, no S3, no S4, no clicks, no rubs, no murmurs ABD:  Flat, positive bowel sounds normal in frequency in pitch, no  bruits, no rebound, no guarding, no midline pulsatile mass, no hepatomegaly, no splenomegaly EXT:  2 plus pulses throughout, no edema, no cyanosis no clubbing SKIN:  No rashes no nodules NEURO:  Cranial nerves II through XII grossly intact, motor grossly intact throughout PSYCH:  Cognitively intact, oriented to person place and time  EKG:  04/28/21: Sinus rhythm, rate 78 bpm 06/23/20: sinus rhythm.  Rate 69 bpm.  Limited echo 06/16/2020 IMPRESSIONS   1. Left ventricular ejection fraction, by estimation, is 60 to 65%. The  left ventricle has normal function. The left ventricle has no regional  wall motion abnormalities.   2. Right ventricular systolic function is normal. The right ventricular  size is normal. There is normal pulmonary artery systolic pressure.   3. 25 mm Amplatzer occluder device in good position across septum No  obvious residual PFO Frame 15 had color flow at aortic edge of septum on  RA side but I believe this is caval flow.   4. The mitral valve is normal in structure. No evidence of mitral valve  regurgitation. No evidence of mitral stenosis.   5. The aortic valve is normal in structure. Aortic valve regurgitation is  not visualized. No aortic stenosis is present.   6.  The inferior vena cava is normal in size with greater than 50%  respiratory variability, suggesting right atrial pressure of 3 mmHg.    06/16/20 PATENT FORAMEN OVALE (PFO) CLOSURE  Conclusion SUCCESSFUL TRANSCATHETER PFO CLOSURE USING INTRACARDIAC ECHO AND FLUOROSCOPIC GUIDANCE. DEFECT CLOSED WITH A 25 MM AMPLATZER PFO OCCLUDER DEVICE   RECOMMEND: ASA/CLOPIDOGREL X 6 MONTHS SBE PROPHYLAXIS X 6 MONTHS LIMITED 2D ECHO POST-PROCEDURE ASSESSMENT   Ambulatory monitor 05/2020: Patient's monitoring period was from 04/16/20-05/15/20 Predominant rhythm was NSR with average HR 69bpm ranging from 45bpm to 127bpm. There were rare SVEs (<1%), occasional PVCs (1%, 16977) No afib, significant pauses or VT Overall, no significant arrhythmias detected on the monitor   Recent Labs: 07/21/2020: ALT 15; BUN 9; Creatinine, Ser 1.04; Hemoglobin 13.9; Platelets 279; Potassium 4.0; Sodium 140; TSH 1.130    Lipid Panel    Component Value Date/Time   CHOL 102 07/21/2020 1052   TRIG 40 07/21/2020 1052   HDL 40 07/21/2020 1052   CHOLHDL 4.0 04/03/2020 0343   VLDL 11 04/03/2020 0343   LDLCALC 51 07/21/2020 1052      Wt Readings from Last 3 Encounters:  04/28/21 209 lb 14.4 oz (95.2 kg)  04/12/21 205 lb (93 kg)  01/19/21 201 lb (91.2 kg)      ASSESSMENT AND PLAN: No problem-specific Assessment & Plan notes found for this encounter.  # Cryptogenic stroke:  # PFO s/p occlusion:  Etiology of her stroke was not identified.  She had no DVTs and arrhythmias were not detected on her ambulatory monitor.  She does have a PFO that was successfully closed by Dr. Burt Knack on 05/2020.  She is now off clopidogrel and continues aspirin indefinitely.   #Hypertension: # Obesity: # Family planning: Blood pressure well have been controlled on lisinopril.  She is doing well and losing weight.  She will work on increasing exercise.  Let us know if she plans for pregnancy so that the ACE-I can be stopped.     Current  medicines are reviewed at length with the patient today.  The patient does not have concerns regarding medicines.  The following changes have been made:  no change  Labs/ tests ordered today include:   No orders of the defined  types were placed in this encounter.   Disposition:   FU with Tiffany C. Oval Linsey, MD, [facility] in 2-3 months  I,Mykaella Javier,acting as a scribe for Skeet Latch, MD.,have documented all relevant documentation on the behalf of Skeet Latch, MD,as directed by  Skeet Latch, MD while in the presence of Skeet Latch, MD.  ***  Signed, Tiffany C. Oval Linsey, MD, [facility]  04/28/2021 2:48 PM    [place]

## 2021-04-29 ENCOUNTER — Telehealth: Payer: Self-pay

## 2021-04-29 ENCOUNTER — Other Ambulatory Visit: Payer: Self-pay | Admitting: Internal Medicine

## 2021-04-29 DIAGNOSIS — F5104 Psychophysiologic insomnia: Secondary | ICD-10-CM

## 2021-04-29 DIAGNOSIS — G43709 Chronic migraine without aura, not intractable, without status migrainosus: Secondary | ICD-10-CM

## 2021-04-29 LAB — CBC WITH DIFFERENTIAL/PLATELET
Basophils Absolute: 0 10*3/uL (ref 0.0–0.2)
Basos: 1 %
EOS (ABSOLUTE): 0 10*3/uL (ref 0.0–0.4)
Eos: 1 %
Hematocrit: 40.3 % (ref 34.0–46.6)
Hemoglobin: 14.1 g/dL (ref 11.1–15.9)
Immature Grans (Abs): 0 10*3/uL (ref 0.0–0.1)
Immature Granulocytes: 0 %
Lymphocytes Absolute: 2.2 10*3/uL (ref 0.7–3.1)
Lymphs: 33 %
MCH: 31.4 pg (ref 26.6–33.0)
MCHC: 35 g/dL (ref 31.5–35.7)
MCV: 90 fL (ref 79–97)
Monocytes Absolute: 0.6 10*3/uL (ref 0.1–0.9)
Monocytes: 9 %
Neutrophils Absolute: 3.7 10*3/uL (ref 1.4–7.0)
Neutrophils: 56 %
Platelets: 223 10*3/uL (ref 150–450)
RBC: 4.49 x10E6/uL (ref 3.77–5.28)
RDW: 12.8 % (ref 11.7–15.4)
WBC: 6.6 10*3/uL (ref 3.4–10.8)

## 2021-04-29 LAB — TSH: TSH: 1.75 u[IU]/mL (ref 0.450–4.500)

## 2021-04-29 LAB — BASIC METABOLIC PANEL
BUN/Creatinine Ratio: 10 (ref 9–23)
BUN: 10 mg/dL (ref 6–24)
CO2: 21 mmol/L (ref 20–29)
Calcium: 9.2 mg/dL (ref 8.7–10.2)
Chloride: 103 mmol/L (ref 96–106)
Creatinine, Ser: 0.98 mg/dL (ref 0.57–1.00)
Glucose: 96 mg/dL (ref 70–99)
Potassium: 4.1 mmol/L (ref 3.5–5.2)
Sodium: 136 mmol/L (ref 134–144)
eGFR: 73 mL/min/{1.73_m2} (ref >59)

## 2021-04-29 LAB — T4, FREE: Free T4: 1.12 ng/dL (ref 0.82–1.77)

## 2021-04-29 LAB — MAGNESIUM: Magnesium: 1.9 mg/dL (ref 1.6–2.3)

## 2021-04-29 MED ORDER — UBRELVY 100 MG PO TABS: ORAL_TABLET | ORAL | 1 refills | Status: DC

## 2021-04-29 MED ORDER — ESZOPICLONE 2 MG PO TABS: 2.0000 mg | ORAL_TABLET | Freq: Every evening | ORAL | 0 refills | Status: DC | PRN

## 2021-04-29 NOTE — Telephone Encounter (Signed)
Pharmacy calling on behave of pt for refill on: UBRELVY 100 MG TABS eszopiclone (LUNESTA) 2 MG TABS tablet  Pharmacy: [facility] [place], [place] - [place]  LOV:07/08/20  Pt CB [phone]

## 2021-05-03 ENCOUNTER — Encounter (INDEPENDENT_AMBULATORY_CARE_PROVIDER_SITE_OTHER): Payer: Self-pay | Admitting: Family Medicine

## 2021-05-03 ENCOUNTER — Other Ambulatory Visit: Payer: Self-pay

## 2021-05-03 ENCOUNTER — Ambulatory Visit (INDEPENDENT_AMBULATORY_CARE_PROVIDER_SITE_OTHER): Payer: BLUE CROSS/BLUE SHIELD | Admitting: Family Medicine

## 2021-05-03 VITALS — BP 113/69 | HR 79 | Temp 98.0°F | Ht 67.0 in | Wt 204.0 lb

## 2021-05-03 DIAGNOSIS — F32A Depression, unspecified: Secondary | ICD-10-CM | POA: Diagnosis not present

## 2021-05-03 DIAGNOSIS — E7849 Other hyperlipidemia: Secondary | ICD-10-CM | POA: Diagnosis not present

## 2021-05-03 DIAGNOSIS — Z6832 Body mass index (BMI) 32.0-32.9, adult: Secondary | ICD-10-CM

## 2021-05-03 DIAGNOSIS — F419 Anxiety disorder, unspecified: Secondary | ICD-10-CM

## 2021-05-03 DIAGNOSIS — E669 Obesity, unspecified: Secondary | ICD-10-CM

## 2021-05-03 MED ORDER — SERTRALINE HCL 50 MG PO TABS: 50.0000 mg | ORAL_TABLET | Freq: Every day | ORAL | 0 refills | Status: DC

## 2021-05-04 NOTE — Progress Notes (Signed)
Chief Complaint:   OBESITY Tracey [name] is here to discuss her progress with her obesity treatment plan along with follow-up of her obesity related diagnoses. Tracey [name] is on keeping a food journal and adhering to recommended goals of 1500-1700 calories and 95+ protein and states she is following her eating plan approximately 80% of the time. Tracey [name] states she is doing 3,000 steps 7 times per week.  Today's visit was #: 10 Starting weight: 209 lbs Starting date: 07/21/2020 Today's weight: 204 lbs Today's date: 05/03/2021 Total lbs lost to date: 5 Total lbs lost since last in-office visit: 1  Interim History: Pt went to the cardiologist a few weeks ago and was told her heart is ok but anxiety is elevated. She wants to focus on trying to eat healthier and plans on eating organically. She is going to join [employer]. She thinks a more structured eating plan will be helpful.  Subjective:   1. Anxiety and depression Pt reports increase in anxiety recently. Lateesha denies suicidal or homicidal ideations.  2. Other hyperlipidemia Pt is on Crestor and her last FLP was within normal limits.  Assessment/Plan:   1. Anxiety and depression Behavior modification techniques were discussed today to help Tracey [name] deal with her anxiety.  Orders and follow up as documented in patient record. [name] will increase Zoloft to 50 mg as prescribed.  Increase & Refill- sertraline (ZOLOFT) 50 MG tablet; Take 1 tablet (50 mg total) by mouth daily.  Dispense: 90 tablet; Refill: 0  2. Other hyperlipidemia Cardiovascular risk and specific lipid/LDL goals reviewed.  We discussed several lifestyle modifications today and [name] will continue to work on diet, exercise and weight loss efforts. Orders and follow up as documented in patient record. Continue Crestor.  Counseling Intensive lifestyle modifications are the first line treatment for this issue. Dietary changes: Increase soluble fiber. Decrease simple  carbohydrates. Exercise changes: Moderate to vigorous-intensity aerobic activity 150 minutes per week if tolerated. Lipid-lowering medications: see documented in medical record.  3. Obesity with current BMI of 32.0 Tracey [name] is currently in the action stage of change. As such, her goal is to continue with weight loss efforts. She has agreed to the Category 4 Plan and keeping a food journal and adhering to recommended goals of 1500-1700 calories and 95+ grams protein.   Exercise goals:  As is  Behavioral modification strategies: increasing lean protein intake, meal planning and cooking strategies, and keeping healthy foods in the home.  Tracey [name] has agreed to follow-up with our clinic in 3 weeks. She was informed of the importance of frequent follow-up visits to maximize her success with intensive lifestyle modifications for her multiple health conditions.   Objective:   Blood pressure 113/69, pulse 79, temperature 98 F (36.7 C), height 5\' 7"  (1.702 m), weight 204 lb (92.5 kg), SpO2 100 %. Body mass index is 31.95 kg/m.  General: Cooperative, alert, well developed, in no acute distress. HEENT: Conjunctivae and lids unremarkable. Cardiovascular: Regular rhythm.  Lungs: Normal work of breathing. Neurologic: No focal deficits.   Lab Results  Component Value Date   CREATININE 0.98 04/28/2021   BUN 10 04/28/2021   NA 136 04/28/2021   K 4.1 04/28/2021   CL 103 04/28/2021   CO2 21 04/28/2021   Lab Results  Component Value Date   ALT 15 07/21/2020   AST 13 07/21/2020   ALKPHOS 59 07/21/2020   BILITOT 0.5 07/21/2020   Lab Results  Component Value Date   HGBA1C 5.5 07/21/2020   HGBA1C  5.1 04/03/2020   HGBA1C 5.1 11/10/2017   HGBA1C 5.6 06/04/2017   Lab Results  Component Value Date   INSULIN 16.6 07/21/2020   Lab Results  Component Value Date   TSH 1.750 04/28/2021   Lab Results  Component Value Date   CHOL 102 07/21/2020   HDL 40 07/21/2020   LDLCALC 51 07/21/2020   TRIG  40 07/21/2020   CHOLHDL 4.0 04/03/2020   Lab Results  Component Value Date   VD25OH 59.1 07/21/2020   VD25OH 26.2 (L) 06/04/2017   VD25OH 14.3 (L) 06/23/2015   Lab Results  Component Value Date   WBC 6.6 04/28/2021   HGB 14.1 04/28/2021   HCT 40.3 04/28/2021   MCV 90 04/28/2021   PLT 223 04/28/2021    Attestation Statements:   Reviewed by clinician on day of visit: allergies, medications, problem list, medical history, surgical history, family history, social history, and previous encounter notes.  Edmund Hilda, CMA, am acting as transcriptionist for Reuben Likes, MD.   I have reviewed the above documentation for accuracy and completeness, and I agree with the above. - Reuben Likes, MD

## 2021-05-10 ENCOUNTER — Ambulatory Visit: Payer: 59 | Admitting: Adult Health

## 2021-05-30 ENCOUNTER — Ambulatory Visit (INDEPENDENT_AMBULATORY_CARE_PROVIDER_SITE_OTHER): Payer: 59 | Admitting: Family Medicine

## 2021-06-03 ENCOUNTER — Encounter (HOSPITAL_BASED_OUTPATIENT_CLINIC_OR_DEPARTMENT_OTHER): Payer: Self-pay | Admitting: Cardiovascular Disease

## 2021-06-03 NOTE — Assessment & Plan Note (Signed)
She underwent implantation of a 25 mm Amplatzer 05/2020.  They recommended 6 months of aspirin and 6 months of SBE prophylaxis.  She is doing well. ?

## 2021-06-06 ENCOUNTER — Other Ambulatory Visit: Payer: Self-pay

## 2021-06-06 ENCOUNTER — Encounter (INDEPENDENT_AMBULATORY_CARE_PROVIDER_SITE_OTHER): Payer: Self-pay | Admitting: Family Medicine

## 2021-06-06 ENCOUNTER — Ambulatory Visit (INDEPENDENT_AMBULATORY_CARE_PROVIDER_SITE_OTHER): Payer: BLUE CROSS/BLUE SHIELD | Admitting: Family Medicine

## 2021-06-06 VITALS — BP 126/73 | HR 65 | Temp 97.7°F | Ht 67.0 in | Wt 206.0 lb

## 2021-06-06 DIAGNOSIS — I10 Essential (primary) hypertension: Secondary | ICD-10-CM | POA: Diagnosis not present

## 2021-06-06 DIAGNOSIS — E669 Obesity, unspecified: Secondary | ICD-10-CM

## 2021-06-06 DIAGNOSIS — Z6832 Body mass index (BMI) 32.0-32.9, adult: Secondary | ICD-10-CM | POA: Diagnosis not present

## 2021-06-07 NOTE — Progress Notes (Signed)
? ? ? ?Chief Complaint:  ? ?OBESITY ?Tracey [name] is here to discuss her progress with her obesity treatment plan along with follow-up of her obesity related diagnoses. Tracey [name] is on the Category 4 Plan or keeping a food journal and adhering to recommended goals of 1500-1700 calories and 95+ grams of protein daily and states she is following her eating plan approximately 80% of the time. Tracey [name] states she is doing 0 minutes 0 times per week. ? ?Today's visit was #: 11 ?Starting weight: 209 lbs ?Starting date: 07/21/2020 ?Today's weight: 206 lbs ?Today's date: 06/06/2021 ?Total lbs lost to date: 3 ?Total lbs lost since last in-office visit: 0 ? ?Interim History: Tracey [name] recognizes she has been eating more cake recently. She doesn't have the baby with her today. She has been indulging more consistently. She has switched to Sprite zero. She is eating salad, romaine, mushroom, with skinny poppyseed dressing (not with protein). She hasn't been logging her food. ? ?Subjective:  ? ?1. Essential hypertension ?Tracey [name]'s blood pressure is well controlled. She denies chest pain, chest pressure, or headache. ? ?Assessment/Plan:  ? ?1. Essential hypertension ?Edia will continue lisinopril, with no change in dose or medications. ? ?2. Obesity with current BMI of 32.4 ?Tracey [name] is currently in the action stage of change. As such, her goal is to continue with weight loss efforts. She has agreed to keeping a food journal and adhering to recommended goals of 1500-1700 calories and 110 grams of protein daily.  ? ?Exercise goals: No exercise has been prescribed at this time. ? ?Behavioral modification strategies: increasing lean protein intake, meal planning and cooking strategies, keeping healthy foods in the home, and planning for success. ? ?Tracey [name] has agreed to follow-up with our clinic in 2 to 3 weeks. She was informed of the importance of frequent follow-up visits to maximize her success with intensive lifestyle modifications for her multiple  health conditions.  ? ?Objective:  ? ?Blood pressure 126/73, pulse 65, temperature 97.7 ?F (36.5 ?C), height 5\' 7"  (1.702 m), weight 206 lb (93.4 kg), SpO2 100 %. ?Body mass index is 32.26 kg/m?. ? ?General: Cooperative, alert, well developed, in no acute distress. ?HEENT: Conjunctivae and lids unremarkable. ?Cardiovascular: Regular rhythm.  ?Lungs: Normal work of breathing. ?Neurologic: No focal deficits.  ? ?Lab Results  ?Component Value Date  ? CREATININE 0.98 04/28/2021  ? BUN 10 04/28/2021  ? NA 136 04/28/2021  ? K 4.1 04/28/2021  ? CL 103 04/28/2021  ? CO2 21 04/28/2021  ? ?Lab Results  ?Component Value Date  ? ALT 15 07/21/2020  ? AST 13 07/21/2020  ? ALKPHOS 59 07/21/2020  ? BILITOT 0.5 07/21/2020  ? ?Lab Results  ?Component Value Date  ? HGBA1C 5.5 07/21/2020  ? HGBA1C 5.1 04/03/2020  ? HGBA1C 5.1 11/10/2017  ? HGBA1C 5.6 06/04/2017  ? ?Lab Results  ?Component Value Date  ? INSULIN 16.6 07/21/2020  ? ?Lab Results  ?Component Value Date  ? TSH 1.750 04/28/2021  ? ?Lab Results  ?Component Value Date  ? CHOL 102 07/21/2020  ? HDL 40 07/21/2020  ? LDLCALC 51 07/21/2020  ? TRIG 40 07/21/2020  ? CHOLHDL 4.0 04/03/2020  ? ?Lab Results  ?Component Value Date  ? VD25OH 59.1 07/21/2020  ? VD25OH 26.2 (L) 06/04/2017  ? VD25OH 14.3 (L) 06/23/2015  ? ?Lab Results  ?Component Value Date  ? WBC 6.6 04/28/2021  ? HGB 14.1 04/28/2021  ? HCT 40.3 04/28/2021  ? MCV 90 04/28/2021  ? PLT 223 04/28/2021  ? ?  No results found for: IRON, TIBC, FERRITIN ? ?Attestation Statements:  ? ?Reviewed by clinician on day of visit: allergies, medications, problem list, medical history, surgical history, family history, social history, and previous encounter notes. ? ? ?I, Burt Knack, am acting as transcriptionist for Reuben Likes, MD. ? ?I have reviewed the above documentation for accuracy and completeness, and I agree with the above. Reuben Likes, MD ? ? ?

## 2021-06-14 NOTE — Progress Notes (Signed)
?[facility]   ?MULTIDISCIPLINARY HEART VALVE CLINIC ?                                    ?Cardiology Office Note:   ? ?Date:  06/15/2021  ? ?ID:  Chasady Longwell, DOB Feb 05, 1976, MRN [id number] ? ?PCP:  Etta Grandchild, MD  ?[facility] Cardiologist:  None  ?[facility] Electrophysiologist:  None  ? ?Referring MD: Etta Grandchild, MD  ? ?Chief Complaint  ?Patient presents with  ? Follow-up  ?  S/p PFO closure  ? ?History of Present Illness:   ? ? Tracey [name] is a 46 y.o. female with a hx of hypertension, stroke, prior tobacco abuse  and PFO s/p PFO closure (05/20/20) who presents for follow-up.  ?  ?She was diagnosed with hypertension after the birth of her youngest child in 2020.  She was started on lisinopril at that time.  She presented to the hospital 03/2020 with an acute stroke with neck discomfort and left-sided numbness and weakness.  She was found to have a small acute infarct in the right posterior frontal lobe.  It was thought to be embolic. LE dopplers negative for DVT. Monitor did not show afib. She followed up with Dr. Excell Seltzer and had a 25 mm Amplatzer occluder device implanted on 06/16/2020.  Postprocedural echo was stable with no residual shunting. She was kept overnight for observation given patient anxiety. ?  ?Seen by Dr. Duke Salvia on 06/23/20 for follow up and doing okay with a dull ache at her groin. They discussed family planning. Also had a migraine that took her to the ER. Saw PCP after and started on medication that is helping.  ?  ?Today she presents to clinic for follow up with her husband. She is very anxious about her echo results. She has been under more stress as they have recently adopted a 46 month old child. She denies chest pain, SOB, LE edema, orthopnea or PND.   ? ?Past Medical History:  ?Diagnosis Date  ? Anxiety   ? Bronchitis   ? Carpal tunnel syndrome on both sides   ? Constipation   ? Depression   ? Family history of adverse reaction to anesthesia   ? mother had  problems waking up from surgery.  ? Gestational diabetes   ? Gestational diabetes mellitus   ? Normal 2 hr GTT postpartum  ? Headache   ? migraines  ? High cholesterol   ? Hypertension   ? Infertility, female   ? Lactose intolerance   ? Oligohydramnios antepartum 11/09/2017  ? Palpitations   ? Panic attack 2017  ? diagnosed 3 months ago. no meds presently  ? Reflux   ? did not filll prescription  ? S/P percutaneous patent foramen ovale closure 06/16/2020  ? s/p PFO occluder device with a a 25 MM AMPLATZER by Dr. Excell Seltzer  ? Stroke [facility])   ? Swelling   ? Vitamin D deficiency   ? ? ?Past Surgical History:  ?Procedure Laterality Date  ? BREAST BIOPSY  right   ? cyst  ? BUBBLE STUDY  04/16/2020  ? Procedure: BUBBLE STUDY;  Surgeon: Chilton Si, MD;  Location: [facility] ENDOSCOPY;  Service: Cardiovascular;;  ? DILATION AND EVACUATION N/A 07/26/2015  ? Procedure: DILATATION AND EVACUATION;  Surgeon: Brock Bad, MD;  Location: [facility] ORS;  Service: Gynecology;  Laterality: N/A;  ? PATENT FORAMEN OVALE(PFO) CLOSURE N/A  06/16/2020  ? Procedure: PATENT FORAMEN OVALE (PFO) CLOSURE;  Surgeon: Tonny Bollman, MD;  Location: [facility] INVASIVE CV LAB;  Service: Cardiovascular;  Laterality: N/A;  ? TEE WITHOUT CARDIOVERSION N/A 04/16/2020  ? Procedure: TRANSESOPHAGEAL ECHOCARDIOGRAM (TEE);  Surgeon: Chilton Si, MD;  Location: [facility] ENDOSCOPY;  Service: Cardiovascular;  Laterality: N/A;  ? ? ?Current Medications: ?Current Meds  ?Medication Sig  ? aspirin 81 MG EC tablet Take 81 mg by mouth daily. Swallow whole.  ? buPROPion (WELLBUTRIN SR) 150 MG 12 hr tablet Take 1 tablet (150 mg total) by mouth daily in the morning.  ? eszopiclone (LUNESTA) 2 MG TABS tablet Take 1 tablet (2 mg total) by mouth at bedtime as needed for sleep. Take immediately before bedtime  ? levonorgestrel (MIRENA, 52 MG,) 20 MCG/24HR IUD 1 Intra Uterine Device (1 each total) by Intrauterine route once for 1 dose.  ? lisinopril (ZESTRIL) 20 MG tablet Take 1.5 tablets (30  mg total) by mouth daily.  ? Multiple Vitamins-Minerals (MULTIVITAMIN WITH MINERALS) tablet Take 1 tablet by mouth daily.  ? Probiotic Product (PROBIOTIC DAILY PO) Take 1 tablet by mouth daily.   ? rosuvastatin (CRESTOR) 10 MG tablet Take 1 tablet (10 mg total) by mouth daily.  ? sertraline (ZOLOFT) 50 MG tablet Take 1 tablet (50 mg total) by mouth daily.  ? Ubrogepant (UBRELVY) 100 MG TABS TAKE ONE (1) TABLET BY MOUTH DAILY AS NEEDED.  ?  ? ?Allergies:   Patient has no known allergies.  ? ?Social History  ? ?Socioeconomic History  ? Marital status: Married  ?  Spouse name: Not on file  ? Number of children: Not on file  ? Years of education: Not on file  ? Highest education level: Not on file  ?Occupational History  ? Occupation: Not Working  ?Tobacco Use  ? Smoking status: Every Day  ?  Packs/day: 0.25  ?  Types: Cigarettes  ? Smokeless tobacco: Never  ? Tobacco comments:  ?  4 cig/day  ?Vaping Use  ? Vaping Use: Never used  ?Substance and Sexual Activity  ? Alcohol use: No  ?  Alcohol/week: 0.0 standard drinks  ? Drug use: No  ? Sexual activity: Yes  ?  Birth control/protection: I.U.D.  ?Other Topics Concern  ? Not on file  ?Social History Narrative  ? Lives at home with spouse and her 2 children  ? Right handed  ? Caffeine: 2 cups/day  ? ?Social Determinants of Health  ? ?Financial Resource Strain: Low Risk   ? Difficulty of Paying Living Expenses: Not hard at all  ?Food Insecurity: No Food Insecurity  ? Worried About Programme researcher, broadcasting/film/video in the Last Year: Never true  ? Ran Out of Food in the Last Year: Never true  ?Transportation Needs: No Transportation Needs  ? Lack of Transportation (Medical): No  ? Lack of Transportation (Non-Medical): No  ?Physical Activity: Sufficiently Active  ? Days of Exercise per Week: 5 days  ? Minutes of Exercise per Session: 30 min  ?Stress: Not on file  ?Social Connections: Not on file  ?  ? ?Family History: ?The patient's family history includes Alcoholism in her father; Anxiety  disorder in her father; Breast cancer in her maternal aunt; Depression in her father; Diabetes in her maternal aunt and maternal uncle; Hypertension in her mother. ? ?ROS:   ?Please see the history of present illness.    ?All other systems reviewed and are negative. ? ?EKGs/Labs/Other Studies Reviewed:   ? ?The following  studies were reviewed today: ? ?Limited echo 06/16/2020 ?IMPRESSIONS  ? 1. Left ventricular ejection fraction, by estimation, is 60 to 65%. The  ?left ventricle has normal function. The left ventricle has no regional  ?wall motion abnormalities.  ? 2. Right ventricular systolic function is normal. The right ventricular  ?size is normal. There is normal pulmonary artery systolic pressure.  ? 3. 25 mm Amplatzer occluder device in good position across septum No  ?obvious residual PFO Frame 15 had color flow at aortic edge of septum on  ?RA side but I believe this is caval flow.  ? 4. The mitral valve is normal in structure. No evidence of mitral valve  ?regurgitation. No evidence of mitral stenosis.  ? 5. The aortic valve is normal in structure. Aortic valve regurgitation is  ?not visualized. No aortic stenosis is present.  ? 6. The inferior vena cava is normal in size with greater than 50%  ?respiratory variability, suggesting right atrial pressure of 3 mmHg.  ?  ?_______________________ ?  ?06/16/20 ?PATENT FORAMEN OVALE (PFO) CLOSURE  ?Conclusion ?SUCCESSFUL TRANSCATHETER PFO CLOSURE USING INTRACARDIAC ECHO AND FLUOROSCOPIC GUIDANCE. DEFECT CLOSED WITH A 25 MM AMPLATZER PFO OCCLUDER DEVICE ?  ?RECOMMEND: ?ASA/CLOPIDOGREL X 6 MONTHS ?SBE PROPHYLAXIS X 6 MONTHS ?LIMITED 2D ECHO POST-PROCEDURE ASSESSMENT ?  ?Ambulatory monitor 05/2020: ?Patient's monitoring period was from 04/16/20-05/15/20 ?Predominant rhythm was NSR with average HR 69bpm ranging from 45bpm to 127bpm. ?There were rare SVEs (<1%), occasional PVCs (1%, [id number]) ?No afib, significant pauses or VT ?Overall, no significant arrhythmias detected  on the monitor ?  ?EKG:  EKG is not ordered today. ? ?Recent Labs: ?07/21/2020: ALT 15 ?04/28/2021: BUN 10; Creatinine, Ser 0.98; Hemoglobin 14.1; Magnesium 1.9; Platelets 223; Potassium 4.1; Sodium 136; TSH 1

## 2021-06-15 ENCOUNTER — Ambulatory Visit: Payer: BLUE CROSS/BLUE SHIELD | Admitting: Cardiology

## 2021-06-15 ENCOUNTER — Ambulatory Visit (HOSPITAL_COMMUNITY): Payer: BLUE CROSS/BLUE SHIELD | Attending: Cardiology

## 2021-06-15 VITALS — BP 120/80 | HR 66 | Ht 67.0 in | Wt 209.6 lb

## 2021-06-15 DIAGNOSIS — I10 Essential (primary) hypertension: Secondary | ICD-10-CM

## 2021-06-15 DIAGNOSIS — F419 Anxiety disorder, unspecified: Secondary | ICD-10-CM

## 2021-06-15 DIAGNOSIS — I639 Cerebral infarction, unspecified: Secondary | ICD-10-CM | POA: Insufficient documentation

## 2021-06-15 DIAGNOSIS — I6389 Other cerebral infarction: Secondary | ICD-10-CM

## 2021-06-15 DIAGNOSIS — Z8774 Personal history of (corrected) congenital malformations of heart and circulatory system: Secondary | ICD-10-CM | POA: Diagnosis not present

## 2021-06-15 DIAGNOSIS — Q2112 Patent foramen ovale: Secondary | ICD-10-CM | POA: Diagnosis not present

## 2021-06-15 NOTE — Patient Instructions (Addendum)

## 2021-06-16 LAB — ECHOCARDIOGRAM LIMITED BUBBLE STUDY
Area-P 1/2: 3.89 cm2
S' Lateral: 3.3 cm

## 2021-06-22 ENCOUNTER — Ambulatory Visit (INDEPENDENT_AMBULATORY_CARE_PROVIDER_SITE_OTHER): Payer: 59 | Admitting: Family Medicine

## 2021-06-28 ENCOUNTER — Encounter (INDEPENDENT_AMBULATORY_CARE_PROVIDER_SITE_OTHER): Payer: Self-pay | Admitting: Family Medicine

## 2021-06-28 ENCOUNTER — Ambulatory Visit (INDEPENDENT_AMBULATORY_CARE_PROVIDER_SITE_OTHER): Payer: BLUE CROSS/BLUE SHIELD | Admitting: Family Medicine

## 2021-06-28 VITALS — BP 113/76 | HR 65 | Temp 97.4°F | Ht 67.0 in | Wt 207.0 lb

## 2021-06-28 DIAGNOSIS — E669 Obesity, unspecified: Secondary | ICD-10-CM | POA: Diagnosis not present

## 2021-06-28 DIAGNOSIS — Z6832 Body mass index (BMI) 32.0-32.9, adult: Secondary | ICD-10-CM

## 2021-06-28 DIAGNOSIS — F32A Depression, unspecified: Secondary | ICD-10-CM

## 2021-06-28 DIAGNOSIS — F419 Anxiety disorder, unspecified: Secondary | ICD-10-CM

## 2021-06-28 DIAGNOSIS — I10 Essential (primary) hypertension: Secondary | ICD-10-CM | POA: Diagnosis not present

## 2021-06-29 ENCOUNTER — Telehealth: Payer: BLUE CROSS/BLUE SHIELD | Admitting: Physician Assistant

## 2021-06-29 DIAGNOSIS — K047 Periapical abscess without sinus: Secondary | ICD-10-CM | POA: Diagnosis not present

## 2021-06-29 MED ORDER — AMOXICILLIN 500 MG PO CAPS: 500.0000 mg | ORAL_CAPSULE | Freq: Three times a day (TID) | ORAL | 0 refills | Status: AC

## 2021-06-29 MED ORDER — IBUPROFEN 600 MG PO TABS: 600.0000 mg | ORAL_TABLET | Freq: Three times a day (TID) | ORAL | 0 refills | Status: DC | PRN

## 2021-06-29 NOTE — Progress Notes (Signed)

## 2021-06-29 NOTE — Progress Notes (Signed)
? ? ? ?Chief Complaint:  ? ?OBESITY ?Tracey [name] is here to discuss her progress with her obesity treatment plan along with follow-up of her obesity related diagnoses. Tracey [name] is on keeping a food journal and adhering to recommended goals of 1500-1700 calories and 110 grams of protein daily and states she is following her eating plan approximately 90% of the time. Tracey [name] states she is doing 0 minutes 0 times per week. ? ?Today's visit was #: 12 ?Starting weight: 209 lbs ?Starting date: 07/21/2020 ?Today's weight: 207 lbs ?Today's date: 06/28/2021 ?Total lbs lost to date: 2 ?Total lbs lost since last in-office visit: 0 ? ?Interim History: Tracey [name] is feeling frustrated with her lack of weight loss. She feels she was doing well on the plan. She is averaging 1600 calories per day and unsure about protein. She recognizes that she wants to be more attentive to her protein intake. She is looking at protein averaging at 60 grams per day.  ? ?Subjective:  ? ?1. Essential hypertension ?Tracey [name]'s blood pressure is well controlled. She denies chest pain, chest pressure, or headache.  ? ?2. Anxiety and depression ?Tracey [name] is on Zoloft with good control of symptoms. She denies suicidal or homicidal ideations.  ? ?Assessment/Plan:  ? ?1. Essential hypertension ?Tracey [name] will continue lisinopril, and we will follow up at her next appointment. ? ?2. Anxiety and depression ?Tracey [name] will continue Zoloft, with no change in dose. ? ?3. Obesity with current BMI of 32.4 ?Tracey [name] is currently in the action stage of change. As such, her goal is to continue with weight loss efforts. She has agreed to keeping a food journal and adhering to recommended goals of 1500-1700 calories and 110+ grams of protein daily.  ? ?Exercise goals: No exercise has been prescribed at this time. ? ?Behavioral modification strategies: increasing lean protein intake, meal planning and cooking strategies, and keeping healthy foods in the home. ? ?Tracey [name] has agreed to follow-up with our  clinic in 4 weeks. She was informed of the importance of frequent follow-up visits to maximize her success with intensive lifestyle modifications for her multiple health conditions.  ? ?Objective:  ? ?Blood pressure 113/76, pulse 65, temperature (!) 97.4 ?F (36.3 ?C), height 5\' 7"  (1.702 m), weight 207 lb (93.9 kg), SpO2 96 %. ?Body mass index is 32.42 kg/m?. ? ?General: Cooperative, alert, well developed, in no acute distress. ?HEENT: Conjunctivae and lids unremarkable. ?Cardiovascular: Regular rhythm.  ?Lungs: Normal work of breathing. ?Neurologic: No focal deficits.  ? ?Lab Results  ?Component Value Date  ? CREATININE 0.98 04/28/2021  ? BUN 10 04/28/2021  ? NA 136 04/28/2021  ? K 4.1 04/28/2021  ? CL 103 04/28/2021  ? CO2 21 04/28/2021  ? ?Lab Results  ?Component Value Date  ? ALT 15 07/21/2020  ? AST 13 07/21/2020  ? ALKPHOS 59 07/21/2020  ? BILITOT 0.5 07/21/2020  ? ?Lab Results  ?Component Value Date  ? HGBA1C 5.5 07/21/2020  ? HGBA1C 5.1 04/03/2020  ? HGBA1C 5.1 11/10/2017  ? HGBA1C 5.6 06/04/2017  ? ?Lab Results  ?Component Value Date  ? INSULIN 16.6 07/21/2020  ? ?Lab Results  ?Component Value Date  ? TSH 1.750 04/28/2021  ? ?Lab Results  ?Component Value Date  ? CHOL 102 07/21/2020  ? HDL 40 07/21/2020  ? LDLCALC 51 07/21/2020  ? TRIG 40 07/21/2020  ? CHOLHDL 4.0 04/03/2020  ? ?Lab Results  ?Component Value Date  ? VD25OH 59.1 07/21/2020  ? VD25OH 26.2 (L) 06/04/2017  ? VD25OH 14.3 (L)  06/23/2015  ? ?Lab Results  ?Component Value Date  ? WBC 6.6 04/28/2021  ? HGB 14.1 04/28/2021  ? HCT 40.3 04/28/2021  ? MCV 90 04/28/2021  ? PLT 223 04/28/2021  ? ?No results found for: IRON, TIBC, FERRITIN ? ?Attestation Statements:  ? ?Reviewed by clinician on day of visit: allergies, medications, problem list, medical history, surgical history, family history, social history, and previous encounter notes. ? ? ?I, Burt Knack, am acting as transcriptionist for Reuben Likes, MD. ?I have reviewed the above  documentation for accuracy and completeness, and I agree with the above. Reuben Likes, MD ? ? ?

## 2021-07-04 ENCOUNTER — Ambulatory Visit (HOSPITAL_BASED_OUTPATIENT_CLINIC_OR_DEPARTMENT_OTHER): Payer: BLUE CROSS/BLUE SHIELD | Admitting: Family

## 2021-07-05 ENCOUNTER — Ambulatory Visit (HOSPITAL_BASED_OUTPATIENT_CLINIC_OR_DEPARTMENT_OTHER): Payer: BLUE CROSS/BLUE SHIELD | Admitting: Cardiovascular Disease

## 2021-07-12 ENCOUNTER — Ambulatory Visit (HOSPITAL_BASED_OUTPATIENT_CLINIC_OR_DEPARTMENT_OTHER): Payer: BLUE CROSS/BLUE SHIELD | Admitting: Family

## 2021-07-27 ENCOUNTER — Encounter (INDEPENDENT_AMBULATORY_CARE_PROVIDER_SITE_OTHER): Payer: Self-pay | Admitting: Family Medicine

## 2021-07-27 ENCOUNTER — Telehealth (INDEPENDENT_AMBULATORY_CARE_PROVIDER_SITE_OTHER): Payer: BLUE CROSS/BLUE SHIELD | Admitting: Family Medicine

## 2021-07-27 DIAGNOSIS — F3289 Other specified depressive episodes: Secondary | ICD-10-CM | POA: Diagnosis not present

## 2021-07-27 DIAGNOSIS — J302 Other seasonal allergic rhinitis: Secondary | ICD-10-CM

## 2021-07-27 DIAGNOSIS — I10 Essential (primary) hypertension: Secondary | ICD-10-CM

## 2021-07-27 DIAGNOSIS — F32A Depression, unspecified: Secondary | ICD-10-CM

## 2021-07-27 DIAGNOSIS — E7849 Other hyperlipidemia: Secondary | ICD-10-CM | POA: Diagnosis not present

## 2021-07-27 DIAGNOSIS — Z6832 Body mass index (BMI) 32.0-32.9, adult: Secondary | ICD-10-CM

## 2021-07-27 DIAGNOSIS — E669 Obesity, unspecified: Secondary | ICD-10-CM

## 2021-07-27 MED ORDER — BUPROPION HCL ER (SR) 150 MG PO TB12: 150.0000 mg | ORAL_TABLET | Freq: Every day | ORAL | 0 refills | Status: DC

## 2021-07-27 MED ORDER — SERTRALINE HCL 50 MG PO TABS: 50.0000 mg | ORAL_TABLET | Freq: Every day | ORAL | 0 refills | Status: DC

## 2021-07-27 MED ORDER — ROSUVASTATIN CALCIUM 10 MG PO TABS: 10.0000 mg | ORAL_TABLET | Freq: Every day | ORAL | 0 refills | Status: DC

## 2021-07-27 MED ORDER — LEVOCETIRIZINE DIHYDROCHLORIDE 5 MG PO TABS: 5.0000 mg | ORAL_TABLET | Freq: Every evening | ORAL | 0 refills | Status: DC

## 2021-08-04 NOTE — Progress Notes (Addendum)
TeleHealth Visit:  Due to the COVID-19 pandemic, this visit was completed with telemedicine (audio/video) technology to reduce patient and provider exposure as well as to preserve personal protective equipment.   Tracey [name] has verbally consented to this TeleHealth visit. The patient is located at home, the provider is located at home. The participants in this visit include the listed provider and patient. The visit was conducted today via Mychart video.  Chief Complaint: OBESITY Tracey [name] is here to discuss her progress with her obesity treatment plan along with follow-up of her obesity related diagnoses. Tracey [name] is on keeping a food journal and adhering to recommended goals of 1500-1700 calories and 110 grams of protein and states she is following her eating plan approximately 90% of the time. Tracey [name] states she is walking 15 minutes 4 times per week.  Today's visit was #: 12 Starting weight: 209 lbs Starting date: 07/21/2020  Interim History: Tracey [name] is dealing with significant allergy issues over the last few weeks. She feels weight has been fluctuating 206-209 lbs. She has started NOOM to track everything she eats. Starting with apple sauce, lt nonfat smoothie or [employer] complete smoothie.  Subjective:   1. Essential hypertension Tracey [name]'s blood pressure is well controlled. Denies chest pain, chest pressure and headache.  2. Other hyperlipidemia Denies myalgias. History of stroke.  3. Seasonal allergies Tracey [name] has been dealing with significant allergy issues.  4. Other depression, with emotional eating Tracey [name] is doing well on Bupropion and Sertraline. Denies suicidal ideas, and homicidal ideas. Symptoms well managed.  Assessment/Plan:   1. Essential hypertension Tracey [name] will continue with current medication with no change in dose.  2. Other hyperlipidemia We will refill Rosuvastatin 10 mg by mouth daily for 1 month with zero refills.  -Refill rosuvastatin (CRESTOR) 10 MG tablet; Take 1 tablet  (10 mg total) by mouth daily.  Dispense: 90 tablet; Refill: 0  3. Seasonal allergies Tracey [name] will start Xyzal 5 mg by mouth daily. We will fill for 3 months with zero refills.  - Start levocetirizine (XYZAL) 5 MG tablet; Take 1 tablet (5 mg total) by mouth every evening.  Dispense: 90 tablet; Refill: 0  4. Other depression, with emotional eating We will refill Sertraline 50 mg by mouth daily and Bupropion 150 mg by mouth daily for 3 months with zero refills.  -Refill sertraline (ZOLOFT) 50 MG tablet; Take 1 tablet (50 mg total) by mouth daily.  Dispense: 90 tablet; Refill: 0  -Refill buPROPion (WELLBUTRIN SR) 150 MG 12 hr tablet; Take 1 tablet (150 mg total) by mouth daily in the morning.  Dispense: 90 tablet; Refill: 0  5. Obesity with current BMI of 32.4 Tracey [name] is currently in the action stage of change. As such, her goal is to continue with weight loss efforts. She has agreed to keeping a food journal and adhering to recommended goals of 1500-1700 calories and 110+ grams of  protein daily.  Exercise goals: As is.  Behavioral modification strategies: increasing lean protein intake, meal planning and cooking strategies, keeping healthy foods in the home, and planning for success.  Tracey [name] has agreed to follow-up with our clinic in 3 weeks. She was informed of the importance of frequent follow-up visits to maximize her success with intensive lifestyle modifications for her multiple health conditions.  Objective:   VITALS: Per patient if applicable, see vitals. GENERAL: Alert and in no acute distress. CARDIOPULMONARY: No increased WOB. Speaking in clear sentences.  PSYCH: Pleasant and cooperative. Speech normal rate and rhythm. Affect is appropriate.  Insight and judgement are appropriate. Attention is focused, linear, and appropriate.  NEURO: Oriented as arrived to appointment on time with no prompting.   Lab Results  Component Value Date   CREATININE 0.98 04/28/2021   BUN 10 04/28/2021    NA 136 04/28/2021   K 4.1 04/28/2021   CL 103 04/28/2021   CO2 21 04/28/2021   Lab Results  Component Value Date   ALT 15 07/21/2020   AST 13 07/21/2020   ALKPHOS 59 07/21/2020   BILITOT 0.5 07/21/2020   Lab Results  Component Value Date   HGBA1C 5.5 07/21/2020   HGBA1C 5.1 04/03/2020   HGBA1C 5.1 11/10/2017   HGBA1C 5.6 06/04/2017   Lab Results  Component Value Date   INSULIN 16.6 07/21/2020   Lab Results  Component Value Date   TSH 1.750 04/28/2021   Lab Results  Component Value Date   CHOL 102 07/21/2020   HDL 40 07/21/2020   LDLCALC 51 07/21/2020   TRIG 40 07/21/2020   CHOLHDL 4.0 04/03/2020   Lab Results  Component Value Date   VD25OH 59.1 07/21/2020   VD25OH 26.2 (L) 06/04/2017   VD25OH 14.3 (L) 06/23/2015   Lab Results  Component Value Date   WBC 6.6 04/28/2021   HGB 14.1 04/28/2021   HCT 40.3 04/28/2021   MCV 90 04/28/2021   PLT 223 04/28/2021   No results found for: IRON, TIBC, FERRITIN  Attestation Statements:   Reviewed by clinician on day of visit: allergies, medications, problem list, medical history, surgical history, family history, social history, and previous encounter notes.  I, Fortino Sic, RMA am acting as transcriptionist for Reuben Likes, MD. I have reviewed the above documentation for accuracy and completeness, and I agree with the above. - Reuben Likes, MD

## 2021-08-11 ENCOUNTER — Ambulatory Visit
Admission: RE | Admit: 2021-08-11 | Discharge: 2021-08-11 | Disposition: A | Discharge status: Home / Self Care | Payer: BLUE CROSS/BLUE SHIELD | Source: Ambulatory Visit

## 2021-08-11 DIAGNOSIS — Z1231 Encounter for screening mammogram for malignant neoplasm of breast: Secondary | ICD-10-CM

## 2021-08-11 IMAGING — MG MM DIGITAL SCREENING BILAT W/ TOMO AND CAD
6 of 10 series · 6 of 30 positions shown · non-contrast
Comparison: Previous exam(s).

CLINICAL DATA: Screening.

EXAM:
DIGITAL SCREENING BILATERAL MAMMOGRAM WITH TOMOSYNTHESIS AND CAD
TECHNIQUE: Bilateral screening digital craniocaudal and mediolateral oblique
mammograms were obtained. Bilateral screening digital breast
tomosynthesis was performed. The images were evaluated with
computer-aided detection.

[L CC synth-2D]
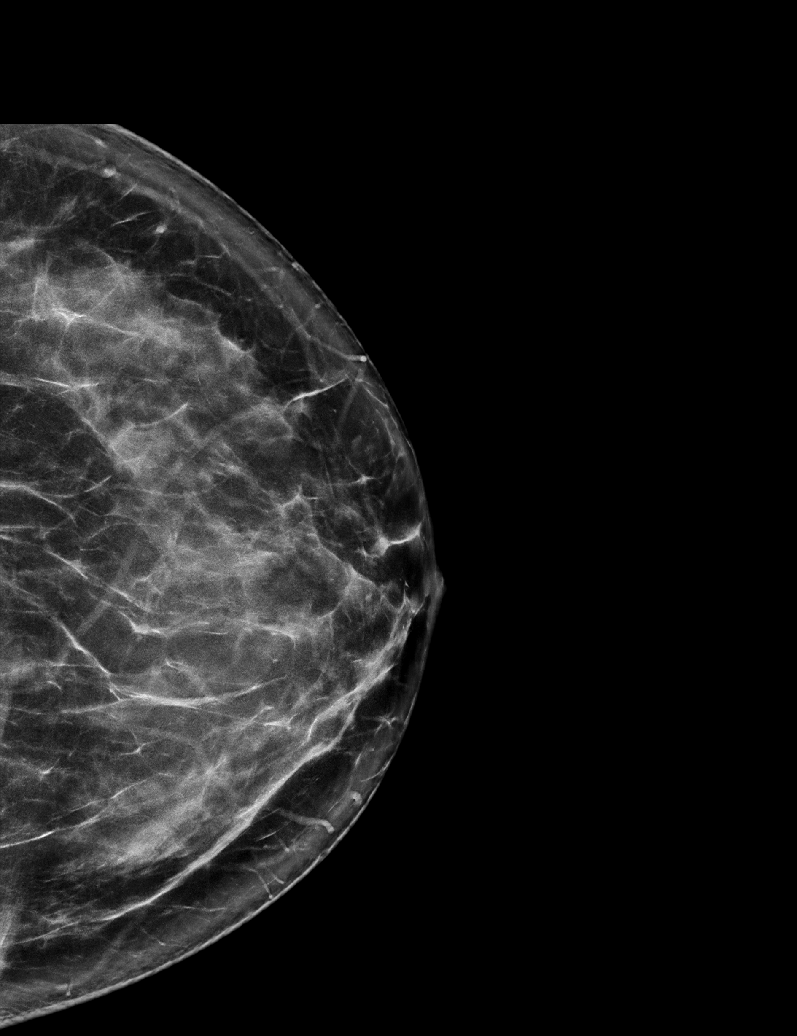

[R MLO synth-2D]
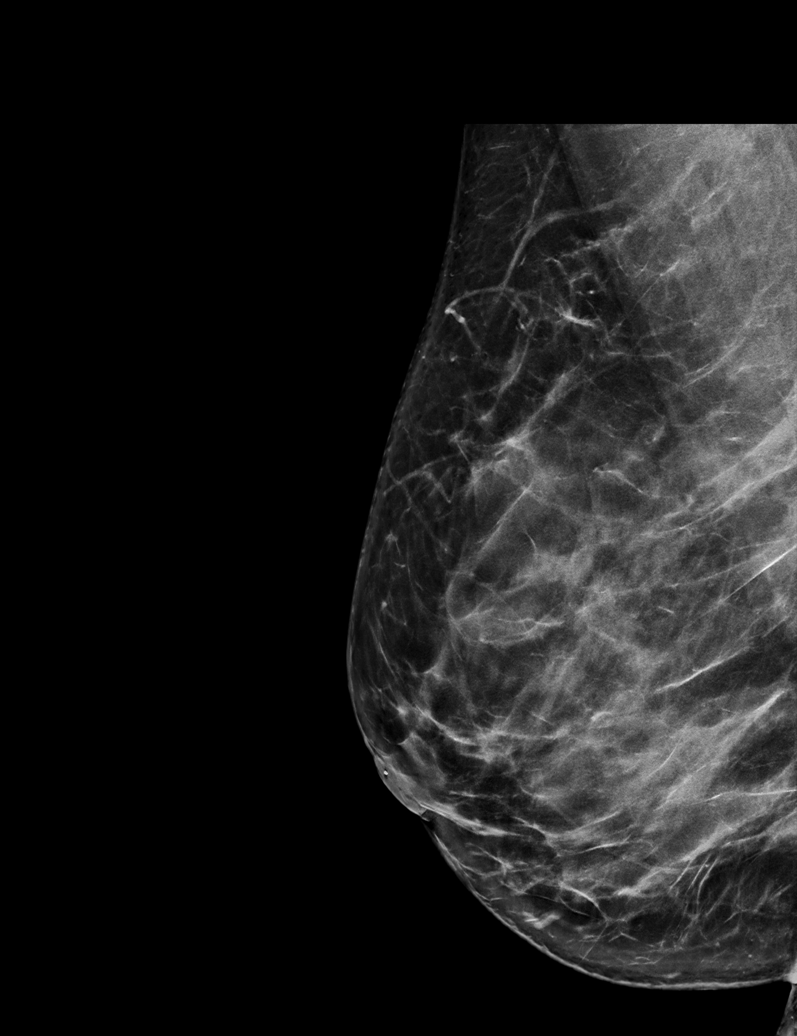

[L MLO synth-2D (1 of 2)]
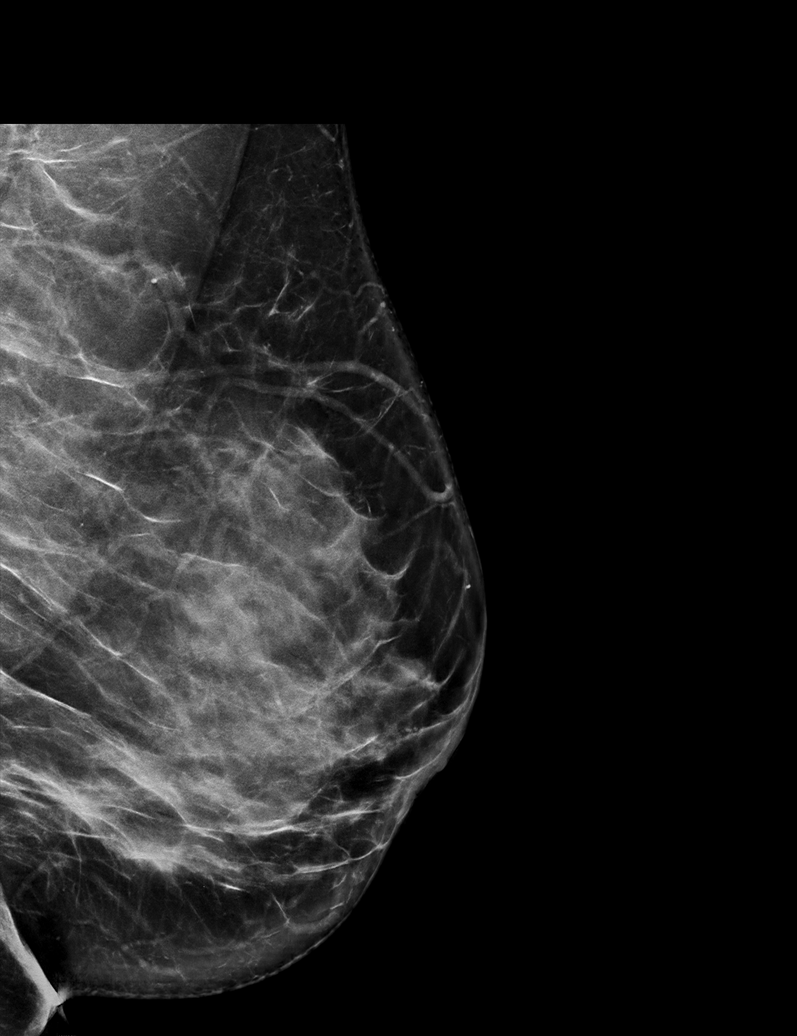

[R CC synth-2D]
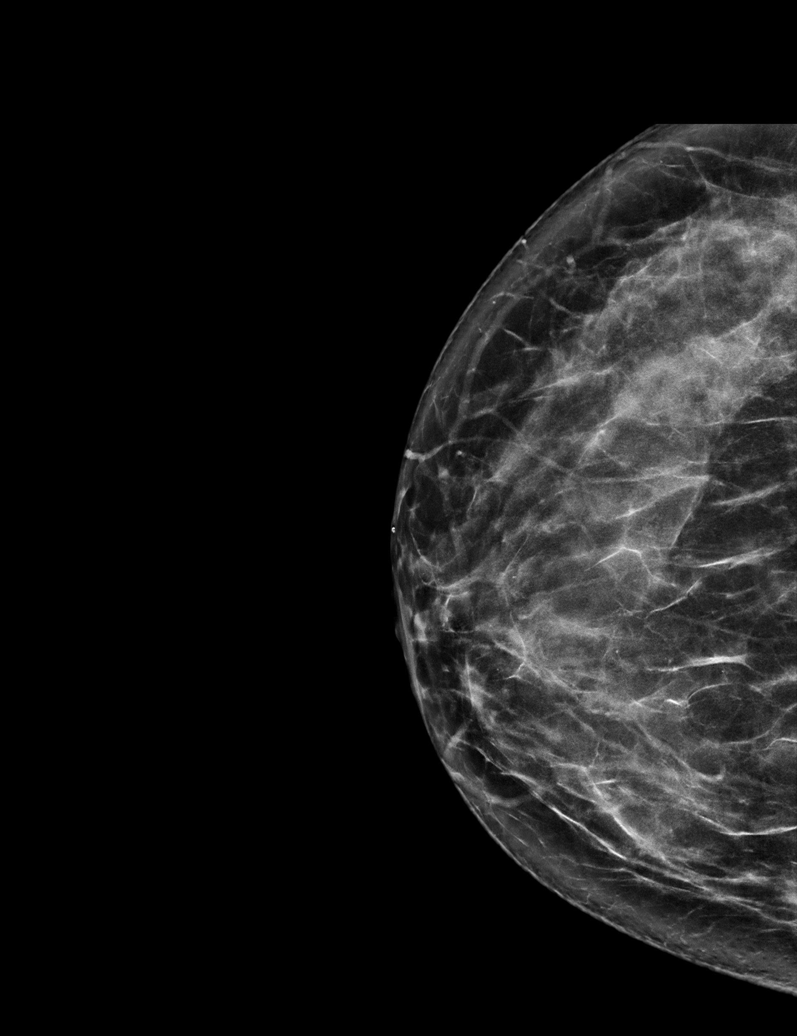

[L MLO synth-2D (2 of 2)]
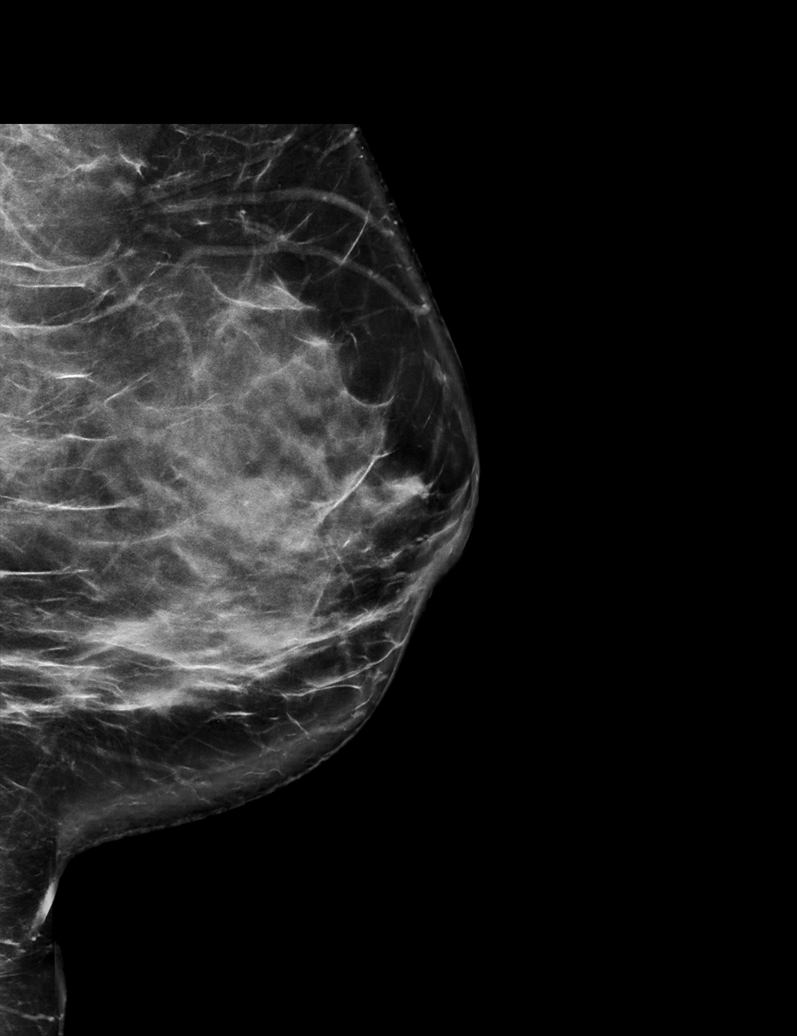

[L CC tomo · tomo slice 38/75.0]
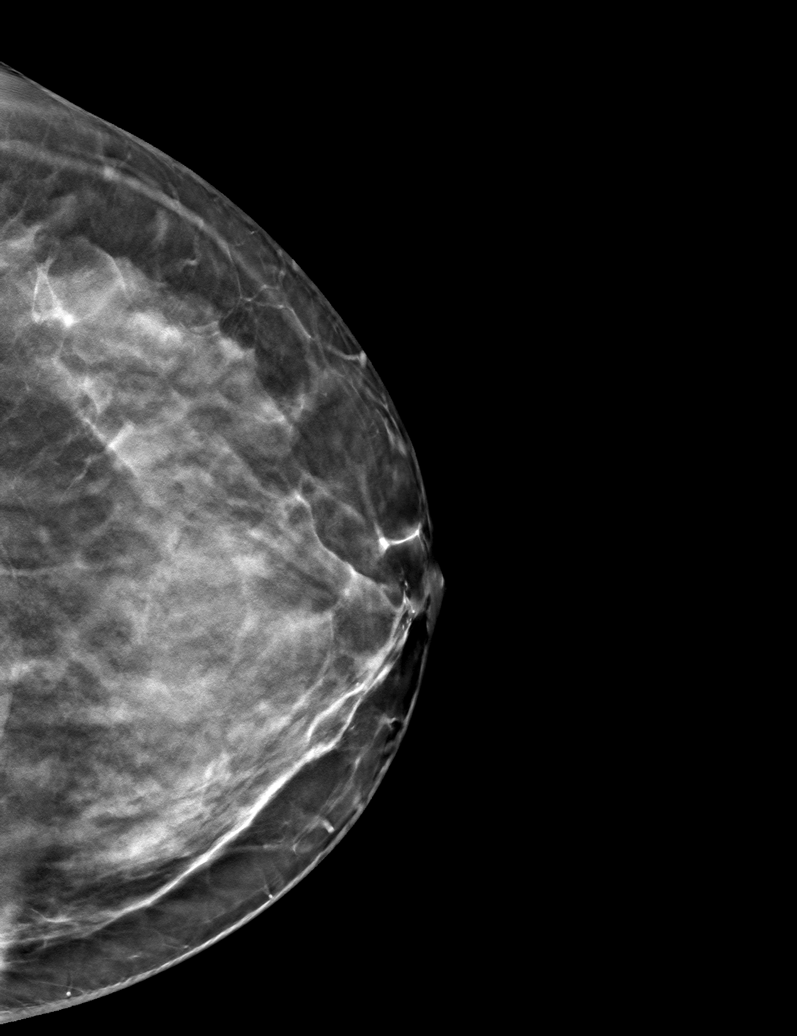

[6 of 30 positions shown; findings below may reference images not displayed]

ACR Breast Density Category d: The breast tissue is extremely dense,
which lowers the sensitivity of mammography.
FINDINGS: In the right breast, a possible asymmetry warrants further
evaluation. In the left breast, no findings suspicious for
malignancy.
IMPRESSION: Further evaluation is suggested for possible asymmetry in the right
breast.

RECOMMENDATION:
Diagnostic mammogram and possibly ultrasound of the right breast.
(Code:CR-G-IIX)

The patient will be contacted regarding the findings, and additional
imaging will be scheduled.

BI-RADS CATEGORY  0: Incomplete. Need additional imaging evaluation
and/or prior mammograms for comparison.

## 2021-08-16 ENCOUNTER — Other Ambulatory Visit (HOSPITAL_BASED_OUTPATIENT_CLINIC_OR_DEPARTMENT_OTHER): Payer: Self-pay

## 2021-08-16 ENCOUNTER — Encounter (HOSPITAL_BASED_OUTPATIENT_CLINIC_OR_DEPARTMENT_OTHER): Payer: Self-pay

## 2021-08-16 ENCOUNTER — Other Ambulatory Visit: Payer: Self-pay | Admitting: Nurse Practitioner

## 2021-08-16 ENCOUNTER — Ambulatory Visit (HOSPITAL_BASED_OUTPATIENT_CLINIC_OR_DEPARTMENT_OTHER): Payer: BLUE CROSS/BLUE SHIELD | Admitting: Nurse Practitioner

## 2021-08-16 ENCOUNTER — Encounter (HOSPITAL_BASED_OUTPATIENT_CLINIC_OR_DEPARTMENT_OTHER): Payer: Self-pay | Admitting: Nurse Practitioner

## 2021-08-16 VITALS — BP 122/82 | HR 68 | Ht 67.0 in | Wt 212.0 lb

## 2021-08-16 DIAGNOSIS — E669 Obesity, unspecified: Secondary | ICD-10-CM | POA: Insufficient documentation

## 2021-08-16 DIAGNOSIS — Z6832 Body mass index (BMI) 32.0-32.9, adult: Secondary | ICD-10-CM

## 2021-08-16 DIAGNOSIS — R928 Other abnormal and inconclusive findings on diagnostic imaging of breast: Secondary | ICD-10-CM | POA: Insufficient documentation

## 2021-08-16 DIAGNOSIS — I10 Essential (primary) hypertension: Secondary | ICD-10-CM

## 2021-08-16 DIAGNOSIS — I639 Cerebral infarction, unspecified: Secondary | ICD-10-CM | POA: Diagnosis not present

## 2021-08-16 DIAGNOSIS — E7849 Other hyperlipidemia: Secondary | ICD-10-CM | POA: Insufficient documentation

## 2021-08-16 DIAGNOSIS — Z8673 Personal history of transient ischemic attack (TIA), and cerebral infarction without residual deficits: Secondary | ICD-10-CM

## 2021-08-16 MED ORDER — WEGOVY 0.25 MG/0.5ML ~~LOC~~ SOAJ
0.2500 mg | SUBCUTANEOUS | 0 refills | Status: DC
  Filled 2021-08-16 – 2021-10-03 (×4): qty 2, 28d supply, fill #0

## 2021-08-16 MED ORDER — WEGOVY 0.5 MG/0.5ML ~~LOC~~ SOAJ
0.5000 mg | SUBCUTANEOUS | 2 refills | Status: DC
  Filled 2021-08-16 – 2021-09-14 (×2): qty 2, fill #0

## 2021-08-16 NOTE — Assessment & Plan Note (Addendum)
BMI 33.20 in the setting of history of CVA, hyperlipidemia, insulin resistance, and hypertension.  Discussion with patient today about increased risk factors with elevated BMI in the setting of known cardiovascular disease.  She is currently working out routinely and monitoring her diet closely however has not been able to manage to lose any weight.  Discussion on increase in weight shortly after exercise due to increased muscle mass. In the setting of significant comorbidities I do feel that GLP-1 medication would be an excellent option to help her manage her weight and improve her glycemic control as well as her cardiac conditions. We will send prescription for Wegovy in today and work to get prior authorization.  Patient aware that we will have to get approval by insurance and this may take some time.  If unable to get approval with [facility] we can try Saxenda as alternative. Patient will follow-up in approximately 3 months for weight monitoring

## 2021-08-16 NOTE — Progress Notes (Signed)
Tollie Eth, DNP, AGNP-c Primary Care [facility] Sports Medicine [address]  [address] [place], [place] [address] [phone]  New patient visit   Patient: Tracey [name]   DOB: 22-Jun-1975   46 y.o. Female  MRN: [id number] Visit Date: 08/16/2021  Patient Care Team: Tollie Eth, NP as PCP - General (Nurse Practitioner)  Today's Vitals   08/16/21 1554  BP: 122/82  Pulse: 68  SpO2: 98%  Weight: 212 lb (96.2 kg)  Height:  (1.702 m)   Body mass index is 33.2 kg/m.   Today's healthcare provider: Tollie Eth, NP   Chief Complaint  Patient presents with   New Patient (Initial Visit)    Patient is here to establish care. Would like to discuss mammogram ( she is worried)      Subjective    Inell Mimbs Yonan is a 46 y.o. female who presents today as a new patient to establish care.    Patient endorses the following concerns presently:  Recent mammogram - two fathers sisters have breast cancer -Several years ago and had biopsy on the right breast at approximately 6:00 location -History of nodularity to the breasts -Recent mammogram showed area of concern at previous area of biopsy. -No recent night sweats, weight loss, changes to the breast, new nodularities  Weight Management - going to the weight loss center - weight going up despite diet and activity changes -She has been diagnosed with insulin resistance -Her sister recently started [facility] and she had questions about this medication -She is exercising regularly and monitoring what she eats -Despite these changes she has gained over 12 pounds in the last couple of months.   History reviewed and reveals the following: Past Medical History:  Diagnosis Date   Anxiety    Bronchitis    Carpal tunnel syndrome on both sides    Constipation    Depression    Family history of adverse reaction to anesthesia    mother had problems waking up from surgery.   Gestational diabetes    Gestational  diabetes mellitus    Normal 2 hr GTT postpartum   Headache    migraines   High cholesterol    Hypertension    Infertility, female    Lactose intolerance    Oligohydramnios antepartum 11/09/2017   Palpitations    Panic attack 2017   diagnosed 3 months ago. no meds presently   Reflux    did not filll prescription   S/P percutaneous patent foramen ovale closure 06/16/2020   s/p PFO occluder device with a a 25 MM AMPLATZER by Dr. Excell Seltzer   Stroke [facility])    Swelling    Vitamin D deficiency    Past Surgical History:  Procedure Laterality Date   BREAST EXCISIONAL BIOPSY Right    cyst   BUBBLE STUDY  04/16/2020   Procedure: BUBBLE STUDY;  Surgeon: Chilton Si, MD;  Location: [facility] ENDOSCOPY;  Service: Cardiovascular;;   DILATION AND EVACUATION N/A 07/26/2015   Procedure: DILATATION AND EVACUATION;  Surgeon: Brock Bad, MD;  Location: [facility] ORS;  Service: Gynecology;  Laterality: N/A;   PATENT FORAMEN OVALE(PFO) CLOSURE N/A 06/16/2020   Procedure: PATENT FORAMEN OVALE (PFO) CLOSURE;  Surgeon: Tonny Bollman, MD;  Location: [facility] INVASIVE CV LAB;  Service: Cardiovascular;  Laterality: N/A;   TEE WITHOUT CARDIOVERSION N/A 04/16/2020   Procedure: TRANSESOPHAGEAL ECHOCARDIOGRAM (TEE);  Surgeon: Chilton Si, MD;  Location: [facility] ENDOSCOPY;  Service: Cardiovascular;  Laterality: N/A;   Family  Status  Relation Name Status   Mother  Alive   Father  Deceased   Mat Aunt  Other       2 Aunts 55's at diagnosis   Mat Uncle  (Not Specified)   Emelda Brothers  (Not Specified)   Family History  Problem Relation Age of Onset   Hypertension Mother    Anxiety disorder Father    Depression Father    Alcoholism Father    Diabetes Maternal Aunt    Diabetes Maternal Uncle    Breast cancer Paternal Aunt    Social History   Socioeconomic History   Marital status: Married    Spouse name: Not on file   Number of children: Not on file   Years of education: Not on file   Highest education level:  Not on file  Occupational History   Occupation: Not Working  Tobacco Use   Smoking status: Every Day    Packs/day: 0.25    Types: Cigarettes   Smokeless tobacco: Never   Tobacco comments:    4 cig/day  Vaping Use   Vaping Use: Never used  Substance and Sexual Activity   Alcohol use: No    Alcohol/week: 0.0 standard drinks   Drug use: No   Sexual activity: Yes    Birth control/protection: I.U.D.  Other Topics Concern   Not on file  Social History Narrative   Lives at home with spouse and her 2 children   Right handed   Caffeine: 2 cups/day   Social Determinants of Health   Financial Resource Strain: Low Risk    Difficulty of Paying Living Expenses: Not hard at all  Food Insecurity: No Food Insecurity   Worried About Programme researcher, broadcasting/film/video in the Last Year: Never true   Ran Out of Food in the Last Year: Never true  Transportation Needs: No Transportation Needs   Lack of Transportation (Medical): No   Lack of Transportation (Non-Medical): No  Physical Activity: Sufficiently Active   Days of Exercise per Week: 5 days   Minutes of Exercise per Session: 30 min  Stress: Not on file  Social Connections: Not on file   Outpatient Medications Prior to Visit  Medication Sig   aspirin 81 MG EC tablet Take 81 mg by mouth daily. Swallow whole.   buPROPion (WELLBUTRIN SR) 150 MG 12 hr tablet Take 1 tablet (150 mg total) by mouth daily in the morning.   eszopiclone (LUNESTA) 2 MG TABS tablet Take 1 tablet (2 mg total) by mouth at bedtime as needed for sleep. Take immediately before bedtime   ibuprofen (ADVIL) 600 MG tablet Take 1 tablet (600 mg total) by mouth every 8 (eight) hours as needed for moderate pain.   levocetirizine (XYZAL) 5 MG tablet Take 1 tablet (5 mg total) by mouth every evening.   levonorgestrel (MIRENA, 52 MG,) 20 MCG/24HR IUD 1 Intra Uterine Device (1 each total) by Intrauterine route once for 1 dose.   lisinopril (ZESTRIL) 20 MG tablet Take 1.5 tablets (30 mg total)  by mouth daily.   Multiple Vitamins-Minerals (MULTIVITAMIN WITH MINERALS) tablet Take 1 tablet by mouth daily.   Probiotic Product (PROBIOTIC DAILY PO) Take 1 tablet by mouth daily.    rosuvastatin (CRESTOR) 10 MG tablet Take 1 tablet (10 mg total) by mouth daily.   sertraline (ZOLOFT) 50 MG tablet Take 1 tablet (50 mg total) by mouth daily.   Ubrogepant (UBRELVY) 100 MG TABS TAKE ONE (1) TABLET BY MOUTH DAILY AS NEEDED.  No facility-administered medications prior to visit.   No Known Allergies Immunization History  Administered Date(s) Administered   Influenza,inj,Quad PF,6+ Mos 03/05/2020   Influenza-Unspecified 06/14/2017, 12/25/2018   PFIZER(Purple Top)SARS-COV-2 Vaccination 06/05/2019, 06/26/2019, 01/10/2020   Pneumococcal Polysaccharide-23 04/08/2018   Tdap 09/27/2017    Review of Systems All review of systems negative except what is listed in the HPI   Objective    BP 122/82   Pulse 68   Ht 5\' 7"  (1.702 m)   Wt 212 lb (96.2 kg)   SpO2 98%   BMI 33.20 kg/m  Physical Exam Vitals and nursing note reviewed.  Constitutional:      General: She is not in acute distress.    Appearance: Normal appearance.  Eyes:     Extraocular Movements: Extraocular movements intact.     Conjunctiva/sclera: Conjunctivae normal.     Pupils: Pupils are equal, round, and reactive to light.  Neck:     Vascular: No carotid bruit.  Cardiovascular:     Rate and Rhythm: Normal rate and regular rhythm.     Pulses: Normal pulses.     Heart sounds: Normal heart sounds. No murmur heard. Pulmonary:     Effort: Pulmonary effort is normal.     Breath sounds: Normal breath sounds. No wheezing.  Abdominal:     General: Bowel sounds are normal.     Palpations: Abdomen is soft.  Musculoskeletal:        General: Normal range of motion.     Cervical back: Normal range of motion.     Right lower leg: No edema.     Left lower leg: No edema.  Skin:    General: Skin is warm and dry.     Capillary  Refill: Capillary refill takes less than 2 seconds.  Neurological:     General: No focal deficit present.     Mental Status: She is alert and oriented to person, place, and time.  Psychiatric:        Mood and Affect: Mood normal.        Behavior: Behavior normal.        Thought Content: Thought content normal.        Judgment: Judgment normal.    No results found for any visits on 08/16/21.  Assessment & Plan      Problem List Items Addressed This Visit     Hypertension - Primary    Chronic.  Well-controlled today.  She has increased her daily activity levels substantially and is working on dietary measures to help with weight loss.  Recent labs with cardiology.  Currently on statin therapy.  No alarm symptoms present today.  We will continue to collaborate care with cardiology.  No changes to medication today.       Relevant Medications   Semaglutide-Weight Management (WEGOVY) 0.5 MG/0.5ML SOAJ   Semaglutide-Weight Management (WEGOVY) 0.25 MG/0.5ML SOAJ   History of stroke    History of right cryptogenic frontal lobe stroke.  No new or recurring symptoms at this time.  She is currently on statin therapy and aspirin for maintenance.  She is following with labs with cardiology closely.  History of PFO with closure.  Working on diet and exercise.  No alarm symptoms present today.  We will continue collaboration of care with cardiology.       Relevant Medications   Semaglutide-Weight Management (WEGOVY) 0.5 MG/0.5ML SOAJ   Semaglutide-Weight Management (WEGOVY) 0.25 MG/0.5ML SOAJ   Other hyperlipidemia    Chronic.  Currently  on statin therapy.  No alarm symptoms present today.  We will continue to collaborate care with cardiology.       Relevant Medications   Semaglutide-Weight Management (WEGOVY) 0.5 MG/0.5ML SOAJ   Semaglutide-Weight Management (WEGOVY) 0.25 MG/0.5ML SOAJ   Class 1 obesity with serious comorbidity and body mass index (BMI) of 32.0 to 32.9 in adult    BMI  33.20 in the setting of history of CVA, hyperlipidemia, insulin resistance, and hypertension.  Discussion with patient today about increased risk factors with elevated BMI in the setting of known cardiovascular disease.  She is currently working out routinely and monitoring her diet closely however has not been able to manage to lose any weight.  Discussion on increase in weight shortly after exercise due to increased muscle mass. In the setting of significant comorbidities I do feel that GLP-1 medication would be an excellent option to help her manage her weight and improve her glycemic control as well as her cardiac conditions. We will send prescription for Wegovy in today and work to get prior authorization.  Patient aware that we will have to get approval by insurance and this may take some time.  If unable to get approval with [facility] we can try Saxenda as alternative. Patient will follow-up in approximately 3 months for weight monitoring       Relevant Medications   Semaglutide-Weight Management (WEGOVY) 0.5 MG/0.5ML SOAJ   Semaglutide-Weight Management (WEGOVY) 0.25 MG/0.5ML SOAJ   Abnormal screening mammogram    Recent screening mammogram with abnormal findings present.  Discussed findings with patient and the recommendation for follow-up ultrasound for further evaluation.  Review of mammogram from previous year which does not show any concerning findings.  Reassurance and understanding provided to patient today.  At this time she plans to continue with ultrasound and further imaging for monitoring.  We will follow-up based on findings.         Return in about 4 months (around 12/17/2021) for Weight management- virtual visit OK.      Walter Min, Sung Amabile, NP, DNP, AGNP-C Primary [facility] Medicine at [facility]

## 2021-08-16 NOTE — Assessment & Plan Note (Signed)
Chronic.  Currently on statin therapy.  No alarm symptoms present today.  We will continue to collaborate care with cardiology.

## 2021-08-16 NOTE — Assessment & Plan Note (Signed)
History of right cryptogenic frontal lobe stroke.  No new or recurring symptoms at this time.  She is currently on statin therapy and aspirin for maintenance.  She is following with labs with cardiology closely.  History of PFO with closure.  Working on diet and exercise.  No alarm symptoms present today.  We will continue collaboration of care with cardiology.

## 2021-08-16 NOTE — Assessment & Plan Note (Signed)
Recent screening mammogram with abnormal findings present.  Discussed findings with patient and the recommendation for follow-up ultrasound for further evaluation.  Review of mammogram from previous year which does not show any concerning findings.  Reassurance and understanding provided to patient today.  At this time she plans to continue with ultrasound and further imaging for monitoring.  We will follow-up based on findings.

## 2021-08-16 NOTE — Assessment & Plan Note (Signed)
Chronic.  Well-controlled today.  She has increased her daily activity levels substantially and is working on dietary measures to help with weight loss.  Recent labs with cardiology.  Currently on statin therapy.  No alarm symptoms present today.  We will continue to collaborate care with cardiology.  No changes to medication today.

## 2021-08-16 NOTE — Patient Instructions (Addendum)
Thank you for choosing [place] at [facility] for your Primary Care needs. I am excited for the opportunity to partner with you to meet your health care goals. It was a pleasure meeting you today!  Recommendations from today's visit: Magnesium supplement at bedtime for constipation and sleep I will send in the prescription for the [facility] and we will work to get the prior authorization for this.  I will keep watching for the Mammogram/Ultrasound results  Information on diet, exercise, and health maintenance recommendations are listed below. This is information to help you be sure you are on track for optimal health and monitoring.   Please look over this and let us know if you have any questions or if you have completed any of the health maintenance outside of [place] so that we can be sure your records are up to date.  ___________________________________________________________ About Me: I am an Adult-Geriatric Nurse Practitioner with a background in caring for patients for more than 20 years with a strong intensive care background. I provide primary care and sports medicine services to patients age 45 and older within this office. My education had a strong focus on caring for the older adult population, which I am passionate about. I am also the director of the APP Fellowship with [facility].   My desire is to provide you with the best service through preventive medicine and supportive care. I consider you a part of the medical team and value your input. I work diligently to ensure that you are heard and your needs are met in a safe and effective manner. I want you to feel comfortable with me as your provider and want you to know that your health concerns are important to me.  For your information, our office hours are: Monday, Tuesday, and Thursday 8:00 AM - 5:00 PM Wednesday and Friday 8:00 AM - 12:00 PM.   In my time away from the office I am teaching new APP's  within the system and am unavailable, but my partner, Dr. Burnard Bunting is in the office for emergent needs.   If you have questions or concerns, please call our office at [phone] or send [place] a MyChart message and we will respond as quickly as possible.  ____________________________________________________________ MyChart:  For all urgent or time sensitive needs we ask that you please call the office to avoid delays. Our number is [phone]. MyChart is not constantly monitored and due to the large volume of messages a day, replies may take up to 72 business hours.  MyChart Policy: MyChart allows for you to see your visit notes, after visit summary, provider recommendations, lab and tests results, make an appointment, request refills, and contact your provider or the office for non-urgent questions or concerns. Providers are seeing patients during normal business hours and do not have built in time to review MyChart messages.  We ask that you allow a minimum of 3 business days for responses to [employer]. For this reason, please do not send urgent requests through [place]. Please call the office at [phone]. New and ongoing conditions may require a visit. We have virtual and in person visit available for your convenience.  Complex MyChart concerns may require a visit. Your provider may request you schedule a virtual or in person visit to ensure we are providing the best care possible. MyChart messages sent after 11:00 AM on Friday will not be received by the provider until Monday morning.    Lab and Test Results:  You will receive your lab and test results on MyChart as soon as they are completed and results have been sent by the lab or testing facility. Due to this service, you will receive your results BEFORE your provider.  I review lab and tests results each morning prior to seeing patients. Some results require collaboration with other providers to ensure you are receiving the most  appropriate care. For this reason, we ask that you please allow a minimum of 3-5 business days from the time the ALL results have been received for your provider to receive and review lab and test results and contact you about these.  Most lab and test result comments from the provider will be sent through [place]. Your provider may recommend changes to the plan of care, follow-up visits, repeat testing, ask questions, or request an office visit to discuss these results. You may reply directly to this message or call the office at [phone] to provide information for the provider or set up an appointment. In some instances, you will be called with test results and recommendations. Please let us know if this is preferred and we will make note of this in your chart to provide this for you.    If you have not heard a response to your lab or test results in 5 business days from all results returning to [place], please call the office to let us know. We ask that you please avoid calling prior to this time unless there is an emergent concern. Due to high call volumes, this can delay the resulting process.  After Hours: For all non-emergency after hours needs, please call the office at [phone] and select the option to reach the on-call provider service. On-call services are shared between multiple [place] offices and therefore it will not be possible to speak directly with your provider. On-call providers may provide medical advice and recommendations, but are unable to provide refills for maintenance medications.  For all emergency or urgent medical needs after normal business hours, we recommend that you seek care at the closest Urgent Care or Emergency Department to ensure appropriate treatment in a timely manner.  [facility] [place] has a 24 hour emergency room located on the ground floor for your convenience.   Urgent Concerns During the Business Day Providers are seeing patients  from 8AM to [place] with a busy schedule and are most often not able to respond to non-urgent calls until the end of the day or the next business day. If you should have URGENT concerns during the day, please call and speak to the nurse or schedule a same day appointment so that we can address your concern without delay.   Thank you, again, for choosing me as your health care partner. I appreciate your trust and look forward to learning more about you.   Worthy Keeler, DNP, AGNP-c ___________________________________________________________  Health Maintenance Recommendations Screening Testing Mammogram Every 1 -2 years based on history and risk factors Starting at age 68 Pap Smear Ages 21-39 every 3 years Ages 57-65 every 5 years with HPV testing More frequent testing may be required based on results and history Colon Cancer Screening Every 1-10 years based on test performed, risk factors, and history Starting at age 84 Bone Density Screening Every 2-10 years based on history Starting at age 59 for women Recommendations for men differ based on medication usage, history, and risk factors AAA Screening One time ultrasound Men 43-42 years old who have every smoked Lung Cancer  Screening Low Dose Lung CT every 12 months Age 61-80 years with a 30 pack-year smoking history who still smoke or who have quit within the last 15 years  Screening Labs Routine  Labs: Complete Blood Count (CBC), Complete Metabolic Panel (CMP), Cholesterol (Lipid Panel) Every 6-12 months based on history and medications May be recommended more frequently based on current conditions or previous results Hemoglobin A1c Lab Every 3-12 months based on history and previous results Starting at age 59 or earlier with diagnosis of diabetes, high cholesterol, BMI >26, and/or risk factors Frequent monitoring for patients with diabetes to ensure blood sugar control Thyroid Panel (TSH w/ T3 & T4) Every 6 months based on  history, symptoms, and risk factors May be repeated more often if on medication HIV One time testing for all patients 60 and older May be repeated more frequently for patients with increased risk factors or exposure Hepatitis C One time testing for all patients 41 and older May be repeated more frequently for patients with increased risk factors or exposure Gonorrhea, Chlamydia Every 12 months for all sexually active persons 13-24 years Additional monitoring may be recommended for those who are considered high risk or who have symptoms PSA Men 17-67 years old with risk factors Additional screening may be recommended from age 52-69 based on risk factors, symptoms, and history  Vaccine Recommendations Tetanus Booster All adults every 10 years Flu Vaccine All patients 6 months and older every year COVID Vaccine All patients 12 years and older Initial dosing with booster May recommend additional booster based on age and health history HPV Vaccine 2 doses all patients age 69-26 Dosing may be considered for patients over 26 Shingles Vaccine (Shingrix) 2 doses all adults 43 years and older Pneumonia (Pneumovax 23) All adults 41 years and older May recommend earlier dosing based on health history Pneumonia (Prevnar 29) All adults 44 years and older Dosed 1 year after Pneumovax 23  Additional Screening, Testing, and Vaccinations may be recommended on an individualized basis based on family history, health history, risk factors, and/or exposure.  __________________________________________________________  Diet Recommendations for All Patients  I recommend that all patients maintain a diet low in saturated fats, carbohydrates, and cholesterol. While this can be challenging at first, it is not impossible and small changes can make big differences.  Things to try: Decreasing the amount of soda, sweet tea, and/or juice to one or less per day and replace with water While water is always  the first choice, if you do not like water you may consider adding a water additive without sugar to improve the taste other sugar free drinks Replace potatoes with a brightly colored vegetable at dinner Use healthy oils, such as canola oil or olive oil, instead of butter or hard margarine Limit your bread intake to two pieces or less a day Replace regular pasta with low carb pasta options Bake, broil, or grill foods instead of frying Monitor portion sizes  Eat smaller, more frequent meals throughout the day instead of large meals  An important thing to remember is, if you love foods that are not great for your health, you don't have to give them up completely. Instead, allow these foods to be a reward when you have done well. Allowing yourself to still have special treats every once in a while is a nice way to tell yourself thank you for working hard to keep yourself healthy.   Also remember that every day is a new day. If you have a  bad day and "fall off the wagon", you can still climb right back up and keep moving along on your journey!  We have resources available to help you!  Some websites that may be helpful include: www.http://carter.biz/  Www.VeryWellFit.com _____________________________________________________________  Activity Recommendations for All Patients  I recommend that all adults get at least 20 minutes of moderate physical activity that elevates your heart rate at least 5 days out of the week.  Some examples include: Walking or jogging at a pace that allows you to carry on a conversation Cycling (stationary bike or outdoors) Water aerobics Yoga Weight lifting Dancing If physical limitations prevent you from putting stress on your joints, exercise in a pool or seated in a chair are excellent options.  Do determine your MAXIMUM heart rate for activity: YOUR AGE - 220 = MAX HeartRate   Remember! Do not push yourself too hard.  Start slowly and build up your pace, speed,  weight, time in exercise, etc.  Allow your body to rest between exercise and get good sleep. You will need more water than normal when you are exerting yourself. Do not wait until you are thirsty to drink. Drink with a purpose of getting in at least 8, 8 ounce glasses of water a day plus more depending on how much you exercise and sweat.    If you begin to develop dizziness, chest pain, abdominal pain, jaw pain, shortness of breath, headache, vision changes, lightheadedness, or other concerning symptoms, stop the activity and allow your body to rest. If your symptoms are severe, seek emergency evaluation immediately. If your symptoms are concerning, but not severe, please let us know so that we can recommend further evaluation.

## 2021-08-17 ENCOUNTER — Other Ambulatory Visit (HOSPITAL_BASED_OUTPATIENT_CLINIC_OR_DEPARTMENT_OTHER): Payer: Self-pay

## 2021-08-17 ENCOUNTER — Ambulatory Visit (HOSPITAL_BASED_OUTPATIENT_CLINIC_OR_DEPARTMENT_OTHER): Payer: BLUE CROSS/BLUE SHIELD | Admitting: Cardiovascular Disease

## 2021-08-18 ENCOUNTER — Encounter (HOSPITAL_BASED_OUTPATIENT_CLINIC_OR_DEPARTMENT_OTHER): Payer: Self-pay

## 2021-08-18 ENCOUNTER — Other Ambulatory Visit (HOSPITAL_BASED_OUTPATIENT_CLINIC_OR_DEPARTMENT_OTHER): Payer: Self-pay

## 2021-08-18 ENCOUNTER — Encounter (HOSPITAL_BASED_OUTPATIENT_CLINIC_OR_DEPARTMENT_OTHER): Payer: Self-pay | Admitting: Pharmacist

## 2021-08-18 DIAGNOSIS — L089 Local infection of the skin and subcutaneous tissue, unspecified: Secondary | ICD-10-CM | POA: Insufficient documentation

## 2021-08-18 DIAGNOSIS — L658 Other specified nonscarring hair loss: Secondary | ICD-10-CM | POA: Insufficient documentation

## 2021-08-18 MED ORDER — FLUOCINOLONE ACETONIDE SCALP 0.01 % EX OIL
TOPICAL_OIL | CUTANEOUS | 5 refills | Status: DC
  Filled 2021-08-18: qty 118.28, 30d supply, fill #0
  Filled 2021-09-14 – 2021-09-27 (×3): qty 118.28, 30d supply, fill #1
  Filled 2021-10-24 – 2021-11-24 (×2): qty 118.28, 30d supply, fill #2
  Filled 2021-12-31: qty 118.28, 30d supply, fill #3
  Filled 2022-01-31: qty 118.28, 30d supply, fill #4
  Filled 2022-03-08: qty 118.28, 30d supply, fill #5

## 2021-08-19 ENCOUNTER — Other Ambulatory Visit (HOSPITAL_BASED_OUTPATIENT_CLINIC_OR_DEPARTMENT_OTHER): Payer: Self-pay

## 2021-08-22 ENCOUNTER — Other Ambulatory Visit (HOSPITAL_BASED_OUTPATIENT_CLINIC_OR_DEPARTMENT_OTHER): Payer: Self-pay

## 2021-08-22 ENCOUNTER — Encounter (HOSPITAL_BASED_OUTPATIENT_CLINIC_OR_DEPARTMENT_OTHER): Payer: Self-pay | Admitting: Nurse Practitioner

## 2021-08-22 ENCOUNTER — Other Ambulatory Visit: Payer: BLUE CROSS/BLUE SHIELD

## 2021-08-22 ENCOUNTER — Telehealth (HOSPITAL_BASED_OUTPATIENT_CLINIC_OR_DEPARTMENT_OTHER): Payer: Self-pay

## 2021-08-22 NOTE — Telephone Encounter (Signed)
Patient left voicemail stating appointment for imaging at [facility] [place] was canceled due to lack of prior auth for diagnostic mammo. Please assist patient.

## 2021-08-24 ENCOUNTER — Ambulatory Visit: Payer: BLUE CROSS/BLUE SHIELD

## 2021-08-24 ENCOUNTER — Ambulatory Visit
Admission: RE | Admit: 2021-08-24 | Discharge: 2021-08-24 | Disposition: A | Discharge status: Home / Self Care | Payer: BLUE CROSS/BLUE SHIELD | Source: Ambulatory Visit | Attending: Nurse Practitioner | Admitting: Nurse Practitioner

## 2021-08-24 DIAGNOSIS — R928 Other abnormal and inconclusive findings on diagnostic imaging of breast: Secondary | ICD-10-CM

## 2021-08-24 IMAGING — MG MM DIGITAL DIAGNOSTIC UNILAT*R* W/ TOMO W/ CAD
4 series · 4 of 12 positions shown · non-contrast
Comparison: Previous exam(s).

CLINICAL DATA: 45-year-old female recalled from screening mammogram
dated 08/11/2021 for a possible right breast asymmetry.

EXAM:
DIGITAL DIAGNOSTIC UNILATERAL RIGHT MAMMOGRAM WITH TOMOSYNTHESIS AND
CAD
TECHNIQUE: Right digital diagnostic mammography and breast tomosynthesis was
performed. The images were evaluated with computer-aided detection.

[R CC synth-2D]
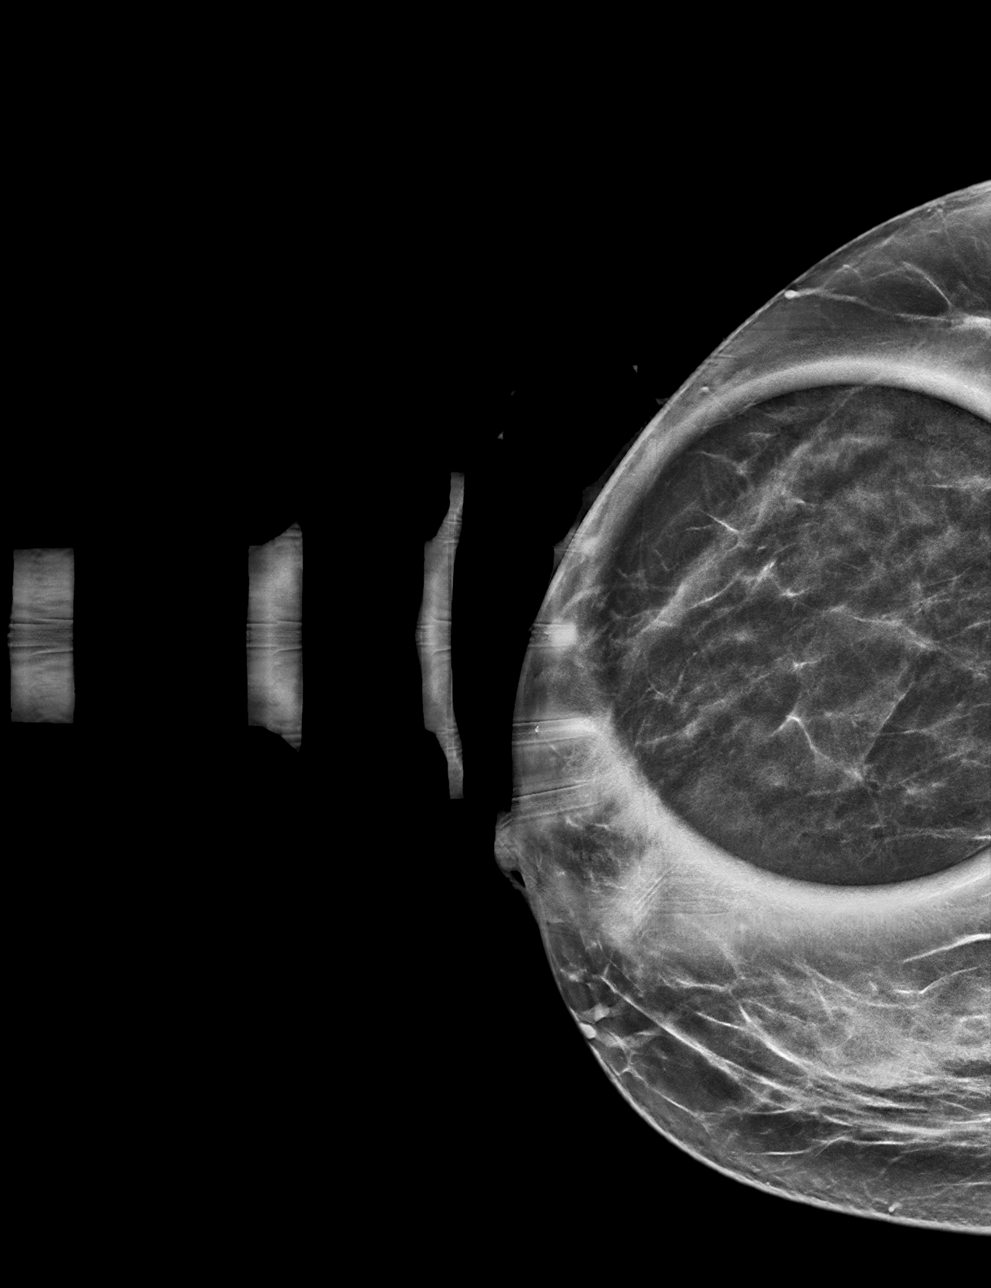

[R ML synth-2D]
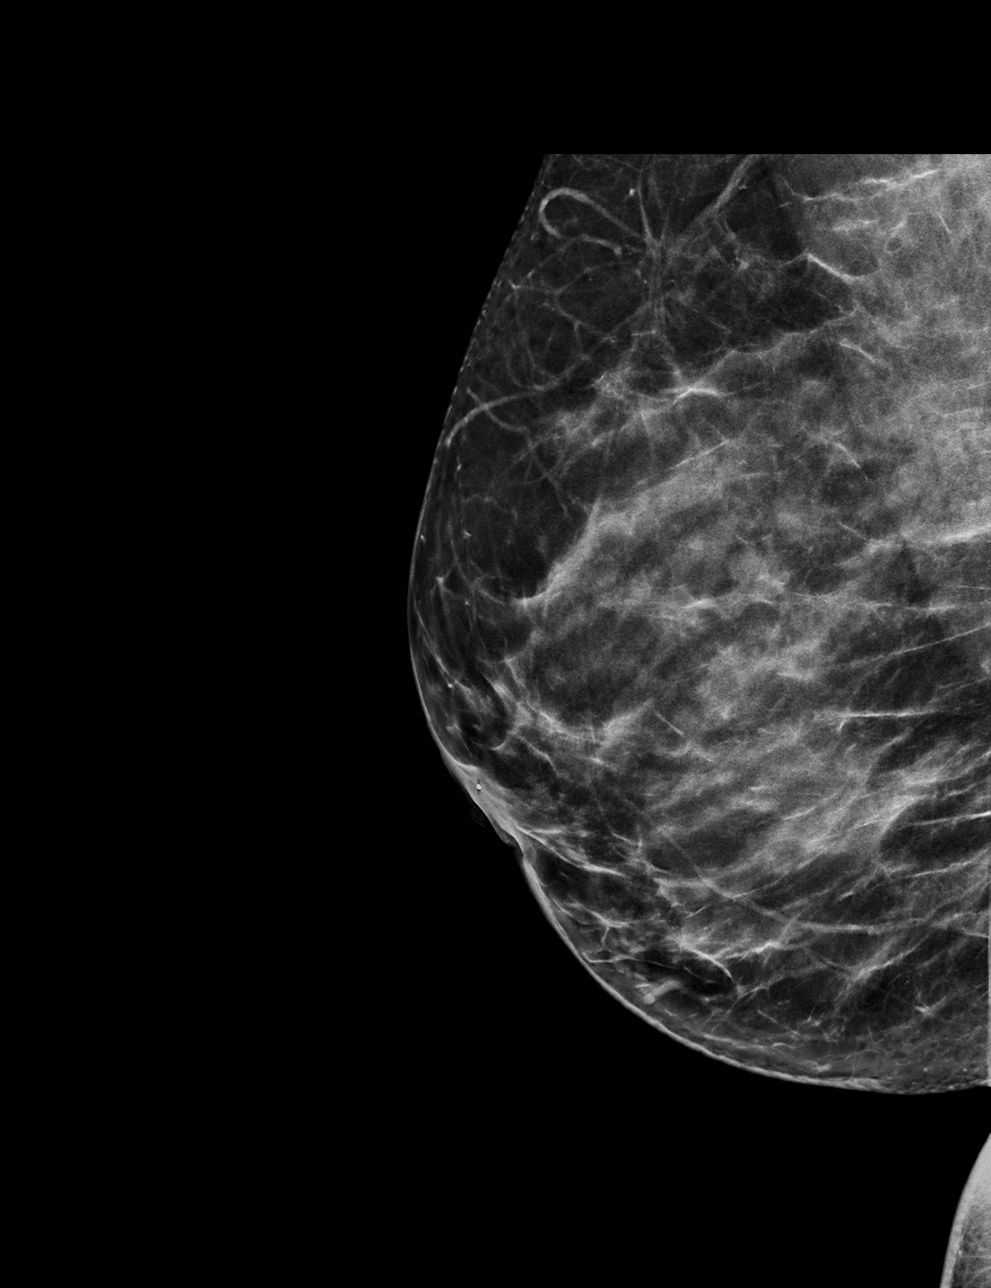

[R CC tomo · tomo slice 29/56.0]
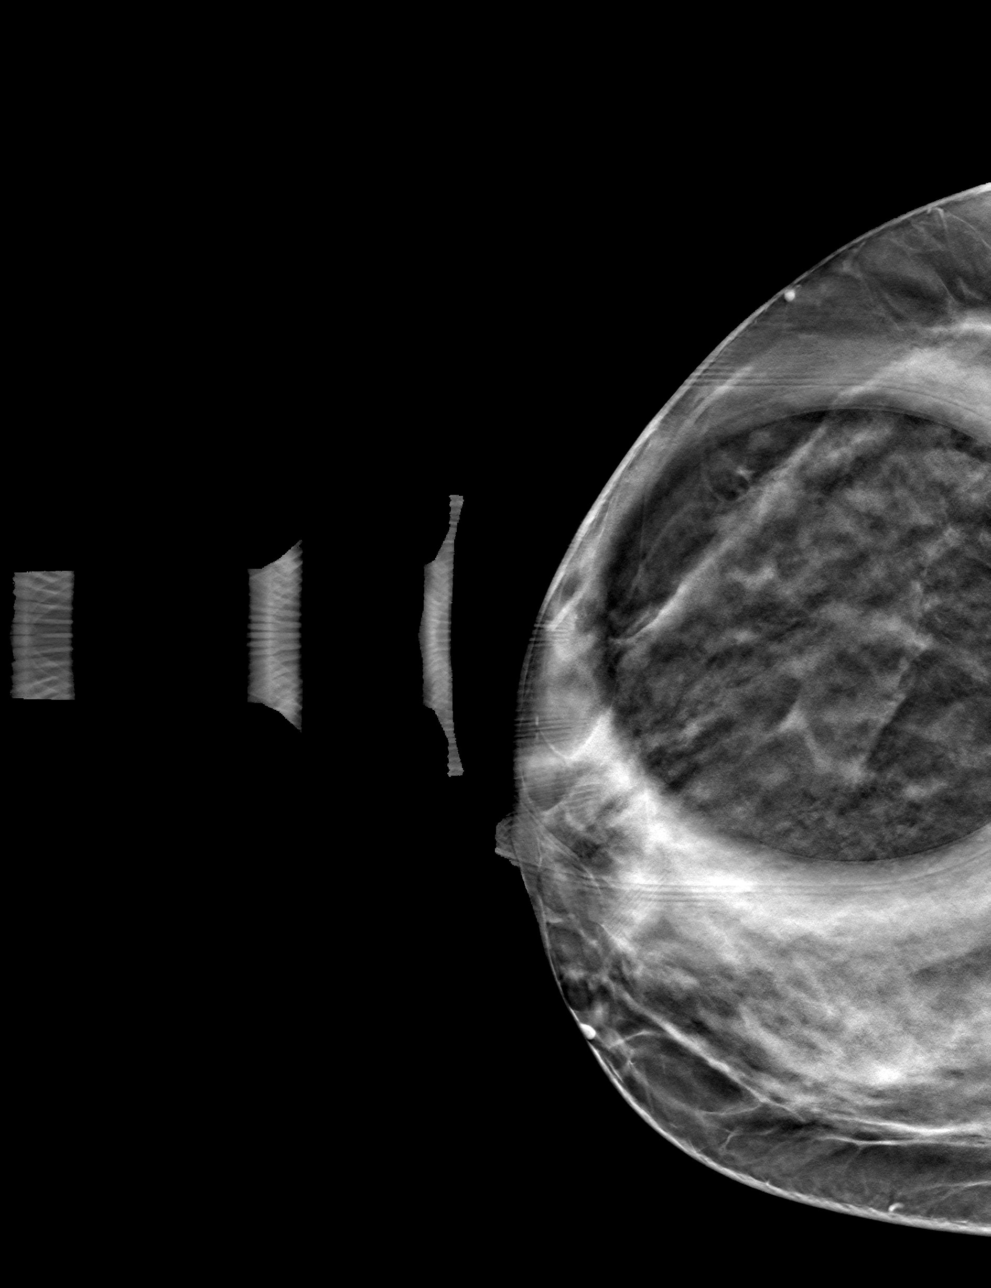

[R ML tomo · tomo slice 31/62.0]
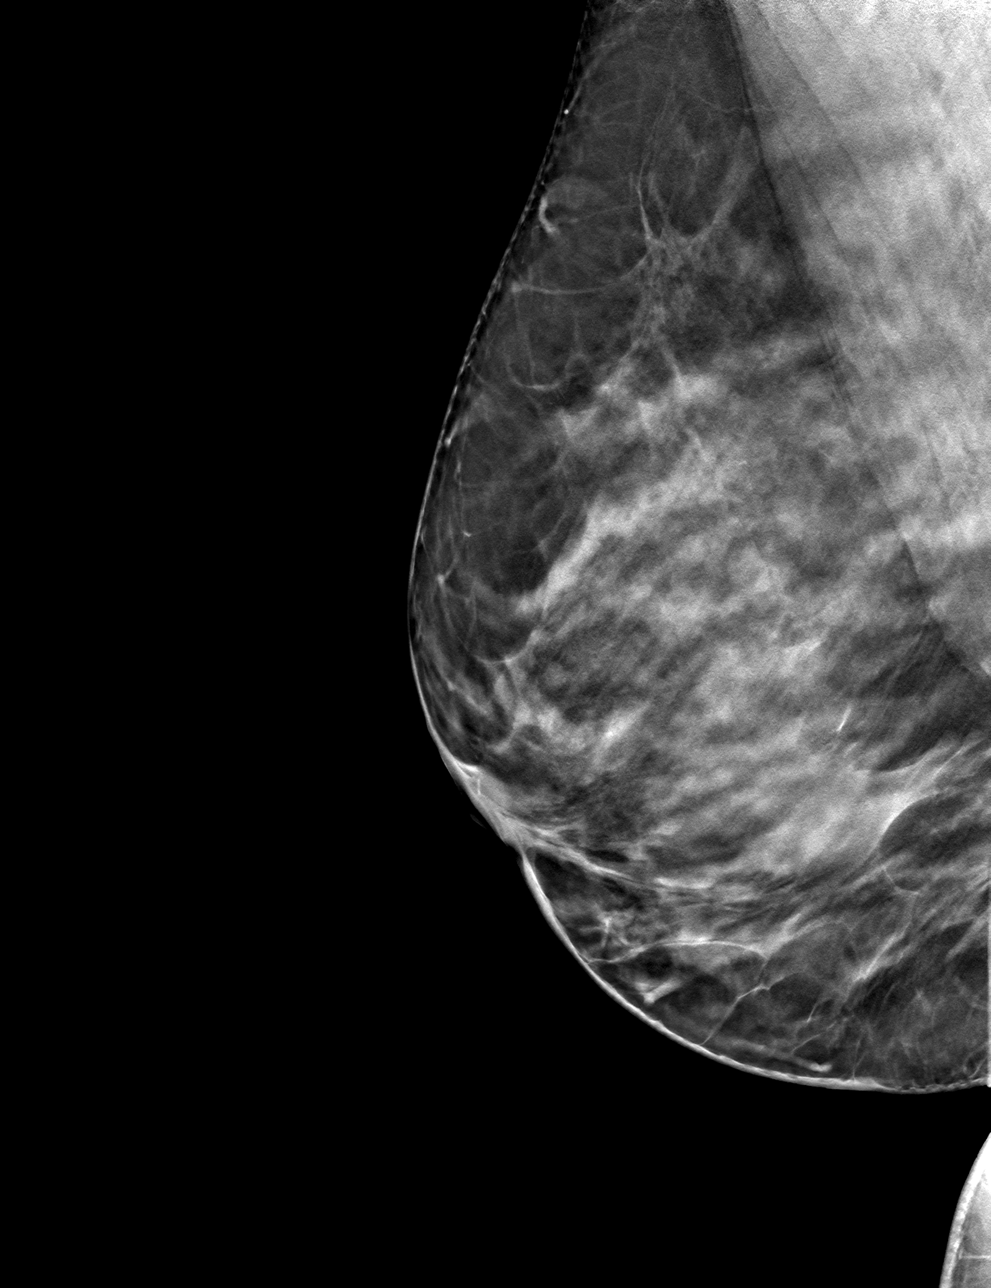

[4 of 12 positions shown; findings below may reference images not displayed]

ACR Breast Density Category d: The breast tissue is extremely dense,
which lowers the sensitivity of mammography.
FINDINGS: Previously described, possible asymmetry in the lateral right breast
at mid depth on the cc projection only resolves into well dispersed
fibroglandular tissue on additional views. No suspicious findings
identified.
IMPRESSION: No mammographic evidence of malignancy.

RECOMMENDATION:
Screening mammogram in one year.(Code:[id number])

I have discussed the findings and recommendations with the patient.
If applicable, a reminder letter will be sent to the patient
regarding the next appointment.

BI-RADS CATEGORY  1: Negative.

## 2021-08-25 ENCOUNTER — Encounter (INDEPENDENT_AMBULATORY_CARE_PROVIDER_SITE_OTHER): Payer: Self-pay | Admitting: Family Medicine

## 2021-08-25 ENCOUNTER — Other Ambulatory Visit (HOSPITAL_BASED_OUTPATIENT_CLINIC_OR_DEPARTMENT_OTHER): Payer: Self-pay

## 2021-08-26 ENCOUNTER — Encounter (HOSPITAL_BASED_OUTPATIENT_CLINIC_OR_DEPARTMENT_OTHER): Payer: Self-pay | Admitting: Nurse Practitioner

## 2021-08-26 ENCOUNTER — Other Ambulatory Visit (HOSPITAL_BASED_OUTPATIENT_CLINIC_OR_DEPARTMENT_OTHER): Payer: Self-pay

## 2021-08-26 DIAGNOSIS — E669 Obesity, unspecified: Secondary | ICD-10-CM

## 2021-08-29 ENCOUNTER — Telehealth (HOSPITAL_BASED_OUTPATIENT_CLINIC_OR_DEPARTMENT_OTHER): Payer: Self-pay | Admitting: Nurse Practitioner

## 2021-08-29 NOTE — Telephone Encounter (Signed)
Received fax transmission from pt's insurance company denying coverage for WEGOVY 0.25 MG. Documents will be in provider's yellow dot tray for review. Please advise

## 2021-08-30 ENCOUNTER — Other Ambulatory Visit (HOSPITAL_BASED_OUTPATIENT_CLINIC_OR_DEPARTMENT_OTHER): Payer: Self-pay

## 2021-08-30 ENCOUNTER — Ambulatory Visit (INDEPENDENT_AMBULATORY_CARE_PROVIDER_SITE_OTHER): Payer: BLUE CROSS/BLUE SHIELD | Admitting: Family Medicine

## 2021-08-30 ENCOUNTER — Encounter (HOSPITAL_BASED_OUTPATIENT_CLINIC_OR_DEPARTMENT_OTHER): Payer: Self-pay

## 2021-08-30 MED ORDER — SAXENDA 18 MG/3ML ~~LOC~~ SOPN
0.6000 mg | PEN_INJECTOR | Freq: Every day | SUBCUTANEOUS | 0 refills | Status: DC
  Filled 2021-08-30: qty 15, 30d supply, fill #0

## 2021-08-31 ENCOUNTER — Other Ambulatory Visit (HOSPITAL_BASED_OUTPATIENT_CLINIC_OR_DEPARTMENT_OTHER): Payer: Self-pay

## 2021-09-01 ENCOUNTER — Encounter (INDEPENDENT_AMBULATORY_CARE_PROVIDER_SITE_OTHER): Payer: Self-pay

## 2021-09-01 ENCOUNTER — Ambulatory Visit (INDEPENDENT_AMBULATORY_CARE_PROVIDER_SITE_OTHER): Payer: BLUE CROSS/BLUE SHIELD | Admitting: Family Medicine

## 2021-09-01 NOTE — Telephone Encounter (Signed)
Updated communication required 

## 2021-09-05 ENCOUNTER — Other Ambulatory Visit (HOSPITAL_BASED_OUTPATIENT_CLINIC_OR_DEPARTMENT_OTHER): Payer: Self-pay

## 2021-09-05 NOTE — Telephone Encounter (Signed)
Received paperwork again that [facility] was denied. No information to appeal.

## 2021-09-12 ENCOUNTER — Other Ambulatory Visit (HOSPITAL_BASED_OUTPATIENT_CLINIC_OR_DEPARTMENT_OTHER): Payer: Self-pay

## 2021-09-13 ENCOUNTER — Other Ambulatory Visit (HOSPITAL_BASED_OUTPATIENT_CLINIC_OR_DEPARTMENT_OTHER): Payer: Self-pay

## 2021-09-15 ENCOUNTER — Other Ambulatory Visit (HOSPITAL_BASED_OUTPATIENT_CLINIC_OR_DEPARTMENT_OTHER): Payer: Self-pay

## 2021-09-16 ENCOUNTER — Telehealth (HOSPITAL_BASED_OUTPATIENT_CLINIC_OR_DEPARTMENT_OTHER): Payer: BLUE CROSS/BLUE SHIELD | Admitting: Nurse Practitioner

## 2021-09-22 ENCOUNTER — Other Ambulatory Visit (HOSPITAL_BASED_OUTPATIENT_CLINIC_OR_DEPARTMENT_OTHER): Payer: Self-pay

## 2021-09-24 ENCOUNTER — Telehealth (HOSPITAL_BASED_OUTPATIENT_CLINIC_OR_DEPARTMENT_OTHER): Payer: Self-pay

## 2021-09-24 NOTE — Telephone Encounter (Signed)
Key: [id number] processed via Cover My Meds. Per patient, ins will likely deny med.

## 2021-09-26 ENCOUNTER — Encounter (HOSPITAL_BASED_OUTPATIENT_CLINIC_OR_DEPARTMENT_OTHER): Payer: Self-pay

## 2021-09-26 ENCOUNTER — Other Ambulatory Visit (HOSPITAL_BASED_OUTPATIENT_CLINIC_OR_DEPARTMENT_OTHER): Payer: Self-pay

## 2021-09-27 ENCOUNTER — Other Ambulatory Visit (HOSPITAL_BASED_OUTPATIENT_CLINIC_OR_DEPARTMENT_OTHER): Payer: Self-pay

## 2021-09-28 ENCOUNTER — Other Ambulatory Visit (HOSPITAL_BASED_OUTPATIENT_CLINIC_OR_DEPARTMENT_OTHER): Payer: Self-pay

## 2021-10-03 ENCOUNTER — Other Ambulatory Visit (HOSPITAL_BASED_OUTPATIENT_CLINIC_OR_DEPARTMENT_OTHER): Payer: Self-pay

## 2021-10-03 NOTE — Telephone Encounter (Signed)
Received mail from [facility] stating that [facility] [place] was denied. Paperwork is left in providers CMA tray in the back. Please advise.

## 2021-10-24 ENCOUNTER — Other Ambulatory Visit (HOSPITAL_BASED_OUTPATIENT_CLINIC_OR_DEPARTMENT_OTHER): Payer: Self-pay

## 2021-10-26 ENCOUNTER — Encounter (INDEPENDENT_AMBULATORY_CARE_PROVIDER_SITE_OTHER): Payer: Self-pay

## 2021-10-26 ENCOUNTER — Ambulatory Visit (HOSPITAL_BASED_OUTPATIENT_CLINIC_OR_DEPARTMENT_OTHER): Payer: BLUE CROSS/BLUE SHIELD | Admitting: Cardiovascular Disease

## 2021-10-26 NOTE — Progress Notes (Incomplete)
Cardiology Office Note   Date:  10/26/2021   ID:  Tracey [name], DOB 10-13-1975, MRN [id number]  PCP:  Tollie Eth, NP  Cardiologist:   Gwenette Greet   No chief complaint on file.    History of Present Illness: Tracey [name] is a 46 y.o. female with hypertension, stroke, PFO s/p closure, and prior tobacco abuse who presents for follow-up. She was diagnosed with hypertension after the birth of her youngest child in 2020.  She was started on lisinopril at that time. She presented to the hospital 03/2020 with an acute stroke with neck discomfort and left-sided numbness and weakness. She was found to have a small acute infarct in the right posterior frontal lobe. It was thought to be embolic. She followed up with Dr. Excell Seltzer and had a 25 mm Amplatzer occluder device implanted on 06/16/2020. Postprocedural echo was stable with no residual shunting. At the last appointment she was doing well but was struggling with weight loss. She was referred to the healthy weight and wellness clinic. She continues to follow-up with them regularly.  At her last appointment, she was doing well. Her blood pressure was well-controlled and she was losing weight with the [facility] program. She called the office reporting palpitations.  Today, she is doing well. However, 2 nights ago, she felt a fluttering in her chest. She described the feeling as nervousness and trying to contain it. The episodes lasted for a couple seconds but occurred frequently throughout the day. The palpitations were less frequent yesterday. She just adopted her step-daughter's 49-month old baby. At work, she was recently promoted and she has to work on focusing because of her stroke history. The palpitations primarily occur when she is lying down or looking at the baby. She wonders if the palpitations are related to life stress. Recently, she endorses insomnia. She was given Lunesta but does not take it often because she  wakes up feeling groggy. She gets about 3 hours of sleep before waking up and not being able to go back to sleep. She has not been able to exercise recently and gained 4 lbs. However, she is trying to get back to routine. For diet, she is trying to eat better. Typically, she drinks two 12 oz mugs of coffee daily. She denies any chest pain, or shortness of breath, lightheadedness, headaches, syncope, orthopnea, PND, lower extremity edema or exertional symptoms.  Past Medical History:  Diagnosis Date   Anxiety    Bronchitis    Carpal tunnel syndrome on both sides    Constipation    Depression    Family history of adverse reaction to anesthesia    mother had problems waking up from surgery.   Gestational diabetes    Gestational diabetes mellitus    Normal 2 hr GTT postpartum   Headache    migraines   High cholesterol    Hypertension    Infertility, female    Lactose intolerance    Oligohydramnios antepartum 11/09/2017   Palpitations    Panic attack 2017   diagnosed 3 months ago. no meds presently   Reflux    did not filll prescription   S/P percutaneous patent foramen ovale closure 06/16/2020   s/p PFO occluder device with a a 25 MM AMPLATZER by Dr. Excell Seltzer   Stroke [facility])    Swelling    Vitamin D deficiency     Past Surgical History:  Procedure Laterality Date   BREAST EXCISIONAL BIOPSY Right  cyst   BUBBLE STUDY  04/16/2020   Procedure: BUBBLE STUDY;  Surgeon: Chilton Si, MD;  Location: [facility] ENDOSCOPY;  Service: Cardiovascular;;   DILATION AND EVACUATION N/A 07/26/2015   Procedure: DILATATION AND EVACUATION;  Surgeon: Brock Bad, MD;  Location: [facility] ORS;  Service: Gynecology;  Laterality: N/A;   PATENT FORAMEN OVALE(PFO) CLOSURE N/A 06/16/2020   Procedure: PATENT FORAMEN OVALE (PFO) CLOSURE;  Surgeon: Tonny Bollman, MD;  Location: [facility] INVASIVE CV LAB;  Service: Cardiovascular;  Laterality: N/A;   TEE WITHOUT CARDIOVERSION N/A 04/16/2020   Procedure: TRANSESOPHAGEAL  ECHOCARDIOGRAM (TEE);  Surgeon: Chilton Si, MD;  Location: [facility] ENDOSCOPY;  Service: Cardiovascular;  Laterality: N/A;     Current Outpatient Medications  Medication Sig Dispense Refill   aspirin 81 MG EC tablet Take 81 mg by mouth daily. Swallow whole.     buPROPion (WELLBUTRIN SR) 150 MG 12 hr tablet Take 1 tablet (150 mg total) by mouth daily in the morning. 90 tablet 0   eszopiclone (LUNESTA) 2 MG TABS tablet Take 1 tablet (2 mg total) by mouth at bedtime as needed for sleep. Take immediately before bedtime 90 tablet 0   Fluocinolone Acetonide Scalp 0.01 % OIL Apply to affected scalp 3-4 times per week. Do not apply to face. 118.28 mL 5   ibuprofen (ADVIL) 600 MG tablet Take 1 tablet (600 mg total) by mouth every 8 (eight) hours as needed for moderate pain. 30 tablet 0   levocetirizine (XYZAL) 5 MG tablet Take 1 tablet (5 mg total) by mouth every evening. 90 tablet 0   levonorgestrel (MIRENA, 52 MG,) 20 MCG/24HR IUD 1 Intra Uterine Device (1 each total) by Intrauterine route once for 1 dose. 1 each 0   lisinopril (ZESTRIL) 20 MG tablet Take 1.5 tablets (30 mg total) by mouth daily. 135 tablet 3   Multiple Vitamins-Minerals (MULTIVITAMIN WITH MINERALS) tablet Take 1 tablet by mouth daily.     Probiotic Product (PROBIOTIC DAILY PO) Take 1 tablet by mouth daily.      rosuvastatin (CRESTOR) 10 MG tablet Take 1 tablet (10 mg total) by mouth daily. 90 tablet 0   SAXENDA 18 MG/3ML SOPN Inject 0.6 mg into the skin daily. 15 mL 0   Semaglutide-Weight Management (WEGOVY) 0.25 MG/0.5ML SOAJ Inject 0.25 mg into the skin once a week. 2 mL 0   Semaglutide-Weight Management (WEGOVY) 0.5 MG/0.5ML SOAJ Inject 0.5 mg into the skin once a week. 2 mL 2   sertraline (ZOLOFT) 50 MG tablet Take 1 tablet (50 mg total) by mouth daily. 90 tablet 0   Ubrogepant (UBRELVY) 100 MG TABS TAKE ONE (1) TABLET BY MOUTH DAILY AS NEEDED. 30 tablet 1   No current facility-administered medications for this visit.     Allergies:   Patient has no known allergies.    Social History:  The patient  reports that she has been smoking cigarettes. She has been smoking an average of .25 packs per day. She has never used smokeless tobacco. She reports that she does not drink alcohol and does not use drugs.   Family History:  The patient's family history includes Alcoholism in her father; Anxiety disorder in her father; Breast cancer in her paternal aunt; Depression in her father; Diabetes in her maternal aunt and maternal uncle; Hypertension in her mother.    ROS:  Please see the history of present illness.    (+) Palpitations (+) Stress (+) Insomnia All other systems are reviewed and negative.   PHYSICAL EXAM: VS:  There were no vitals taken for this visit. , BMI There is no height or weight on file to calculate BMI. GENERAL:  Well appearing HEENT: Pupils equal round and reactive, fundi not visualized, oral mucosa unremarkable NECK:  No jugular venous distention, waveform within normal limits, carotid upstroke brisk and symmetric, no bruits, no thyromegaly LUNGS:  Clear to auscultation bilaterally HEART:  RRR.  PMI not displaced or sustained,S1 and S2 within normal limits, no S3, no S4, no clicks, no rubs, no murmurs ABD:  Flat, positive bowel sounds normal in frequency in pitch, no bruits, no rebound, no guarding, no midline pulsatile mass, no hepatomegaly, no splenomegaly EXT:  2 plus pulses throughout, no edema, no cyanosis no clubbing SKIN:  No rashes no nodules NEURO:  Cranial nerves II through XII grossly intact, motor grossly intact throughout PSYCH:  Cognitively intact, oriented to person place and time  EKG:  04/28/21: Sinus rhythm, rate 78 bpm 06/23/20: sinus rhythm.  Rate 69 bpm.  Limited echo 06/16/2020 IMPRESSIONS   1. Left ventricular ejection fraction, by estimation, is 60 to 65%. The  left ventricle has normal function. The left ventricle has no regional  wall motion abnormalities.   2.  Right ventricular systolic function is normal. The right ventricular  size is normal. There is normal pulmonary artery systolic pressure.   3. 25 mm Amplatzer occluder device in good position across septum No  obvious residual PFO Frame 15 had color flow at aortic edge of septum on  RA side but I believe this is caval flow.   4. The mitral valve is normal in structure. No evidence of mitral valve  regurgitation. No evidence of mitral stenosis.   5. The aortic valve is normal in structure. Aortic valve regurgitation is  not visualized. No aortic stenosis is present.   6. The inferior vena cava is normal in size with greater than 50%  respiratory variability, suggesting right atrial pressure of 3 mmHg.    06/16/20 PATENT FORAMEN OVALE (PFO) CLOSURE  Conclusion SUCCESSFUL TRANSCATHETER PFO CLOSURE USING INTRACARDIAC ECHO AND FLUOROSCOPIC GUIDANCE. DEFECT CLOSED WITH A 25 MM AMPLATZER PFO OCCLUDER DEVICE   RECOMMEND: ASA/CLOPIDOGREL X 6 MONTHS SBE PROPHYLAXIS X 6 MONTHS LIMITED 2D ECHO POST-PROCEDURE ASSESSMENT   Ambulatory monitor 05/2020: Patient's monitoring period was from 04/16/20-05/15/20 Predominant rhythm was NSR with average HR 69bpm ranging from 45bpm to 127bpm. There were rare SVEs (<1%), occasional PVCs (1%, 16977) No afib, significant pauses or VT Overall, no significant arrhythmias detected on the monitor   Recent Labs: 04/28/2021: BUN 10; Creatinine, Ser 0.98; Hemoglobin 14.1; Magnesium 1.9; Platelets 223; Potassium 4.1; Sodium 136; TSH 1.750    Lipid Panel    Component Value Date/Time   CHOL 102 07/21/2020 1052   TRIG 40 07/21/2020 1052   HDL 40 07/21/2020 1052   CHOLHDL 4.0 04/03/2020 0343   VLDL 11 04/03/2020 0343   LDLCALC 51 07/21/2020 1052      Wt Readings from Last 3 Encounters:  08/16/21 212 lb (96.2 kg)  06/28/21 207 lb (93.9 kg)  06/15/21 209 lb 9.6 oz (95.1 kg)      ASSESSMENT AND PLAN: No problem-specific Assessment & Plan notes found for this  encounter.    Current medicines are reviewed at length with the patient today.  The patient does not have concerns regarding medicines.  The following changes have been made:  no change  Labs/ tests ordered today include:   No orders of the defined types were placed in this encounter.  Disposition:   FU with Tiffany C. Duke Salvia, MD, [facility] in 2-3 months  I,Mykaella Javier,acting as a scribe for Chilton Si, MD.,have documented all relevant documentation on the behalf of Chilton Si, MD,as directed by  Chilton Si, MD while in the presence of Chilton Si, MD.  I, Tiffany C. Duke Salvia, MD have reviewed all documentation for this visit.  The documentation of the exam, diagnosis, procedures, and orders on 10/26/2021 are all accurate and complete.   Signed, Tiffany C. Duke Salvia, MD, [facility]  10/26/2021 12:59 PM    [facility]

## 2021-10-29 ENCOUNTER — Other Ambulatory Visit (INDEPENDENT_AMBULATORY_CARE_PROVIDER_SITE_OTHER): Payer: Self-pay | Admitting: Family Medicine

## 2021-10-29 DIAGNOSIS — F3289 Other specified depressive episodes: Secondary | ICD-10-CM

## 2021-11-08 ENCOUNTER — Other Ambulatory Visit (HOSPITAL_BASED_OUTPATIENT_CLINIC_OR_DEPARTMENT_OTHER): Payer: Self-pay

## 2021-11-23 ENCOUNTER — Ambulatory Visit (HOSPITAL_BASED_OUTPATIENT_CLINIC_OR_DEPARTMENT_OTHER): Payer: BLUE CROSS/BLUE SHIELD | Admitting: Cardiovascular Disease

## 2021-11-24 ENCOUNTER — Other Ambulatory Visit (HOSPITAL_BASED_OUTPATIENT_CLINIC_OR_DEPARTMENT_OTHER): Payer: Self-pay

## 2021-11-28 ENCOUNTER — Other Ambulatory Visit (INDEPENDENT_AMBULATORY_CARE_PROVIDER_SITE_OTHER): Payer: Self-pay | Admitting: Family Medicine

## 2021-11-28 DIAGNOSIS — E7849 Other hyperlipidemia: Secondary | ICD-10-CM

## 2021-12-02 ENCOUNTER — Other Ambulatory Visit (HOSPITAL_BASED_OUTPATIENT_CLINIC_OR_DEPARTMENT_OTHER): Payer: Self-pay | Admitting: *Deleted

## 2021-12-02 DIAGNOSIS — R002 Palpitations: Secondary | ICD-10-CM

## 2021-12-02 DIAGNOSIS — Z6832 Body mass index (BMI) 32.0-32.9, adult: Secondary | ICD-10-CM

## 2021-12-05 ENCOUNTER — Ambulatory Visit (INDEPENDENT_AMBULATORY_CARE_PROVIDER_SITE_OTHER): Payer: BLUE CROSS/BLUE SHIELD | Admitting: Family Medicine

## 2021-12-05 ENCOUNTER — Encounter (INDEPENDENT_AMBULATORY_CARE_PROVIDER_SITE_OTHER): Payer: Self-pay | Admitting: Family Medicine

## 2021-12-05 VITALS — BP 124/80 | HR 96 | Temp 98.4°F | Ht 67.0 in | Wt 213.0 lb

## 2021-12-05 DIAGNOSIS — F3289 Other specified depressive episodes: Secondary | ICD-10-CM | POA: Diagnosis not present

## 2021-12-05 DIAGNOSIS — E7849 Other hyperlipidemia: Secondary | ICD-10-CM | POA: Diagnosis not present

## 2021-12-05 DIAGNOSIS — E669 Obesity, unspecified: Secondary | ICD-10-CM

## 2021-12-05 DIAGNOSIS — Z6832 Body mass index (BMI) 32.0-32.9, adult: Secondary | ICD-10-CM

## 2021-12-05 DIAGNOSIS — Z6833 Body mass index (BMI) 33.0-33.9, adult: Secondary | ICD-10-CM | POA: Diagnosis not present

## 2021-12-05 MED ORDER — BUPROPION HCL ER (SR) 150 MG PO TB12: 150.0000 mg | ORAL_TABLET | Freq: Every day | ORAL | 0 refills | Status: DC

## 2021-12-05 MED ORDER — ROSUVASTATIN CALCIUM 10 MG PO TABS: 10.0000 mg | ORAL_TABLET | Freq: Every day | ORAL | 0 refills | Status: DC

## 2021-12-05 MED ORDER — SERTRALINE HCL 50 MG PO TABS: 50.0000 mg | ORAL_TABLET | Freq: Every day | ORAL | 0 refills | Status: DC

## 2021-12-06 ENCOUNTER — Telehealth (HOSPITAL_BASED_OUTPATIENT_CLINIC_OR_DEPARTMENT_OTHER): Payer: Self-pay | Admitting: *Deleted

## 2021-12-06 NOTE — Telephone Encounter (Signed)
Patient had been referred to [facility] in April. Patient never heard, reached out to Almyra Deforest who asked that another referral placed   Mychart message sent to patient

## 2021-12-06 NOTE — Telephone Encounter (Signed)
Patient viewed in mychart.

## 2021-12-06 NOTE — Progress Notes (Signed)
Chief Complaint:   OBESITY Tracey [name] is here to discuss her progress with her obesity treatment plan along with follow-up of her obesity related diagnoses. Tracey [name] is on keeping a food journal and adhering to recommended goals of 1500-1700 calories and 110+ grams of protein and states she is following her eating plan approximately 8% of the time. Tracey [name] states she is walking 60 minutes 7 times per week.  Today's visit was #: 14 Starting weight: 209 lbs Starting date: 07/21/2020 Today's weight: 213 lbs Today's date: 12/05/2021 Total lbs lost to date: 0 lbs Total lbs lost since last in-office visit: 0  Interim History: Tracey [name] has been very stressed with custody and guardianship of baby. Has been going back and forth to court. Two of her husband's friends have died, mother has been dealing with blood pressure and possible strokes.  Subjective:   1. Other hyperlipidemia Tracey [name] is on Crestor 10 mg daily with no myalgias or transaminitis.  2. Other depression, with emotional eating Tracey [name] denies suicidal ideas, and homicidal ideas. Noticing improvement in symptoms with medications.  Assessment/Plan:   1. Other hyperlipidemia We will refill Crestor 10 mg by mouth daily for 3 months with 0 refills.  -Refill rosuvastatin (CRESTOR) 10 MG tablet; Take 1 tablet (10 mg total) by mouth daily.  Dispense: 90 tablet; Refill: 0  2. Other depression, with emotional eating We will refill Wellbutrin SR 150 mg daily AND Sertraline 50 mg daily for 1 month with 0 refills.  -Refill buPROPion (WELLBUTRIN SR) 150 MG 12 hr tablet; Take 1 tablet (150 mg total) by mouth daily in the morning.  Dispense: 90 tablet; Refill: 0  -Refill sertraline (ZOLOFT) 50 MG tablet; Take 1 tablet (50 mg total) by mouth daily.  Dispense: 90 tablet; Refill: 0  3. Obesity with current BMI of 33.4 Tracey [name] is currently in the action stage of change. As such, her goal is to continue with weight loss efforts. She has agreed to keeping a  food journal and adhering to recommended goals of 1500-1700 calories and 110+ grams of protein.   Exercise goals: As is.  Behavioral modification strategies: increasing lean protein intake, meal planning and cooking strategies, keeping healthy foods in the home, and planning for success.  Tracey [name] has agreed to follow-up with our clinic in 4 weeks. She was informed of the importance of frequent follow-up visits to maximize her success with intensive lifestyle modifications for her multiple health conditions.   Objective:   Blood pressure 124/80, pulse 96, temperature 98.4 F (36.9 C), height 5\' 7"  (1.702 m), weight 213 lb (96.6 kg), SpO2 98 %. Body mass index is 33.36 kg/m.  General: Cooperative, alert, well developed, in no acute distress. HEENT: Conjunctivae and lids unremarkable. Cardiovascular: Regular rhythm.  Lungs: Normal work of breathing. Neurologic: No focal deficits.   Lab Results  Component Value Date   CREATININE 0.98 04/28/2021   BUN 10 04/28/2021   NA 136 04/28/2021   K 4.1 04/28/2021   CL 103 04/28/2021   CO2 21 04/28/2021   Lab Results  Component Value Date   ALT 15 07/21/2020   AST 13 07/21/2020   ALKPHOS 59 07/21/2020   BILITOT 0.5 07/21/2020   Lab Results  Component Value Date   HGBA1C 5.5 07/21/2020   HGBA1C 5.1 04/03/2020   HGBA1C 5.1 11/10/2017   HGBA1C 5.6 06/04/2017   Lab Results  Component Value Date   INSULIN 16.6 07/21/2020   Lab Results  Component Value Date   TSH 1.750  04/28/2021   Lab Results  Component Value Date   CHOL 102 07/21/2020   HDL 40 07/21/2020   LDLCALC 51 07/21/2020   TRIG 40 07/21/2020   CHOLHDL 4.0 04/03/2020   Lab Results  Component Value Date   VD25OH 59.1 07/21/2020   VD25OH 26.2 (L) 06/04/2017   VD25OH 14.3 (L) 06/23/2015   Lab Results  Component Value Date   WBC 6.6 04/28/2021   HGB 14.1 04/28/2021   HCT 40.3 04/28/2021   MCV 90 04/28/2021   PLT 223 04/28/2021   No results found for: "IRON",  "TIBC", "FERRITIN"  Attestation Statements:   Reviewed by clinician on day of visit: allergies, medications, problem list, medical history, surgical history, family history, social history, and previous encounter notes.  I, Elnora Morrison, RMA am acting as transcriptionist for Coralie Common, MD.  I have reviewed the above documentation for accuracy and completeness, and I agree with the above. - Coralie Common, MD

## 2021-12-14 ENCOUNTER — Other Ambulatory Visit (INDEPENDENT_AMBULATORY_CARE_PROVIDER_SITE_OTHER): Payer: Self-pay | Admitting: Family Medicine

## 2021-12-14 ENCOUNTER — Encounter (INDEPENDENT_AMBULATORY_CARE_PROVIDER_SITE_OTHER): Payer: Self-pay | Admitting: Family Medicine

## 2021-12-14 DIAGNOSIS — F3289 Other specified depressive episodes: Secondary | ICD-10-CM

## 2021-12-16 ENCOUNTER — Encounter (HOSPITAL_BASED_OUTPATIENT_CLINIC_OR_DEPARTMENT_OTHER): Payer: Self-pay | Admitting: Nurse Practitioner

## 2021-12-16 ENCOUNTER — Other Ambulatory Visit (HOSPITAL_BASED_OUTPATIENT_CLINIC_OR_DEPARTMENT_OTHER): Payer: Self-pay

## 2021-12-16 ENCOUNTER — Ambulatory Visit (INDEPENDENT_AMBULATORY_CARE_PROVIDER_SITE_OTHER): Payer: BLUE CROSS/BLUE SHIELD | Admitting: Nurse Practitioner

## 2021-12-16 DIAGNOSIS — I10 Essential (primary) hypertension: Secondary | ICD-10-CM | POA: Diagnosis not present

## 2021-12-16 DIAGNOSIS — E559 Vitamin D deficiency, unspecified: Secondary | ICD-10-CM | POA: Diagnosis not present

## 2021-12-16 DIAGNOSIS — E7849 Other hyperlipidemia: Secondary | ICD-10-CM

## 2021-12-16 DIAGNOSIS — E669 Obesity, unspecified: Secondary | ICD-10-CM | POA: Diagnosis not present

## 2021-12-16 MED ORDER — VITAMIN D (ERGOCALCIFEROL) 1.25 MG (50000 UNIT) PO CAPS
50000.0000 [IU] | ORAL_CAPSULE | ORAL | 3 refills | Status: AC
Start: 2021-12-16 — End: ?
  Filled 2021-12-16: qty 4, 28d supply, fill #0
  Filled 2022-01-12 – 2022-01-31 (×2): qty 4, 28d supply, fill #1
  Filled 2022-03-08: qty 4, 28d supply, fill #2
  Filled 2022-04-03: qty 4, 28d supply, fill #3
  Filled 2022-05-03 – 2022-05-10 (×2): qty 4, 28d supply, fill #4

## 2021-12-16 MED ORDER — PHENTERMINE HCL 15 MG PO CAPS
15.0000 mg | ORAL_CAPSULE | ORAL | 2 refills | Status: DC
  Filled 2021-12-16: qty 30, 30d supply, fill #0

## 2021-12-16 NOTE — Assessment & Plan Note (Signed)
Tracey [name] continues to struggle with her weight management despite diet and exercise changes. She is currently working with weight management, but has been unable to get coverage for GLP-1 medications through her insurance. Discussion today about additional options that may be helpful to reduce appetite and increase weight loss. It appears her insurance does not cover weight loss medications at this time. Given her health history, I do feel that it is important that we get her BMI down to a healthy level to help lower her risks of further CVD and stroke. Risks and benefits discussed with the patient on phentermine. She would like to trial this. Will send low dose phentermine to the pharmacy for 30 days. Strict instructions to monitor for any signs of elevated BP, HR, headache, chest pain, palpitations and stop the medication immediately and seek care if any of these present.

## 2021-12-16 NOTE — Progress Notes (Unsigned)
Virtual Visit Encounter telephone visit.   I connected with  Tracey [name] on 12/16/21 at 10:30 AM EDT by secure audio telemedicine application. I verified that I am speaking with the correct person using two identifiers.   I introduced myself as a Publishing rights manager with the practice. The limitations of evaluation and management by telemedicine discussed with the patient and the availability of in person appointments. The patient expressed verbal understanding and consent to proceed.  Participating parties in this visit include: Myself and patient  The patient is: Patient Location: Home I am: Provider Location: Office/Clinic Subjective:    CC and HPI: Tracey [name] is a 46 y.o. year old female presenting for follow up of weight. Patient reports the following: Weight  She tells me she is at a standstill with her weight.  She has been seeing the weight management center for help and they gave her tips on diet and exercise she has been following, but she is still unable to see any changes.  Currently she is walking 60 minutes daily and eating 1500-1700 calories a day with high protein options.  She was unable to get coverage for [facility] or Saxenda through her insurance. She has never tried weight management medication in the past.  She is concerned about the effect the added weight is having on her cardiovascular health.  She denies CP, palpitations, ShOB. She has a history of HTN, but this is well controlled at this time with her medication. She has a history of PFO repair.    Past medical history, Surgical history, Family history not pertinant except as noted below, Social history, Allergies, and medications have been entered into the medical record, reviewed, and corrections made.   Review of Systems:  All review of systems negative except what is listed in the HPI  Objective:    Alert and oriented x 4 Speaking in clear sentences with no shortness of breath. No  distress.  Impression and Recommendations:    Problem List Items Addressed This Visit     Class 1 obesity with serious comorbidity and body mass index (BMI) of 33.0 to 33.9 in adult - Primary    Tracey [name] continues to struggle with her weight management despite diet and exercise changes. She is currently working with weight management, but has been unable to get coverage for GLP-1 medications through her insurance. Discussion today about additional options that may be helpful to reduce appetite and increase weight loss. It appears her insurance does not cover weight loss medications at this time. Given her health history, I do feel that it is important that we get her BMI down to a healthy level to help lower her risks of further CVD and stroke. Risks and benefits discussed with the patient on phentermine. She would like to trial this. Will send low dose phentermine to the pharmacy for 30 days. Strict instructions to monitor for any signs of elevated BP, HR, headache, chest pain, palpitations and stop the medication immediately and seek care if any of these present.       Relevant Medications   phentermine 15 MG capsule   Hypertension   Other hyperlipidemia   Relevant Medications   phentermine 15 MG capsule   Vitamin D deficiency   Relevant Medications   Vitamin D, Ergocalciferol, (DRISDOL) 1.25 MG (50000 UNIT) CAPS capsule    orders and follow up as documented in EMR I discussed the assessment and treatment plan with the patient. The patient was provided an opportunity to ask  questions and all were answered. The patient agreed with the plan and demonstrated an understanding of the instructions.   The patient was advised to call back or seek an in-person evaluation if the symptoms worsen or if the condition fails to improve as anticipated.  Follow-Up: in a month for monitoring of phentermine  I provided 23 minutes of non-face-to-face interaction with this non face-to-face encounter including  intake, same-day documentation, and chart review.   Orma Render, NP , DNP, AGNP-c [place] [facility] [phone] (fax)

## 2021-12-16 NOTE — Patient Instructions (Signed)
I have sent phentermine into the pharmacy for you.   I want you to monitor for any of the following symptoms:  Increased heart rate Increased blood pressure New headaches Dizziness Chest pain Palpitations Anxiety  If any of the above occur, stop the phentermine immediately and seek medical care.

## 2021-12-23 NOTE — Telephone Encounter (Signed)
error 

## 2021-12-31 ENCOUNTER — Other Ambulatory Visit (HOSPITAL_BASED_OUTPATIENT_CLINIC_OR_DEPARTMENT_OTHER): Payer: Self-pay

## 2022-01-02 ENCOUNTER — Ambulatory Visit (INDEPENDENT_AMBULATORY_CARE_PROVIDER_SITE_OTHER): Payer: BLUE CROSS/BLUE SHIELD | Admitting: Family Medicine

## 2022-01-05 ENCOUNTER — Other Ambulatory Visit (HOSPITAL_COMMUNITY): Payer: Self-pay

## 2022-01-12 ENCOUNTER — Other Ambulatory Visit (HOSPITAL_BASED_OUTPATIENT_CLINIC_OR_DEPARTMENT_OTHER): Payer: Self-pay

## 2022-01-16 ENCOUNTER — Ambulatory Visit (HOSPITAL_BASED_OUTPATIENT_CLINIC_OR_DEPARTMENT_OTHER): Payer: BLUE CROSS/BLUE SHIELD | Admitting: Obstetrics & Gynecology

## 2022-01-16 ENCOUNTER — Telehealth (HOSPITAL_BASED_OUTPATIENT_CLINIC_OR_DEPARTMENT_OTHER): Payer: Self-pay | Admitting: Obstetrics & Gynecology

## 2022-01-16 NOTE — Telephone Encounter (Signed)
Called patient and left a message to please call the office back about missed appointment.

## 2022-01-23 ENCOUNTER — Other Ambulatory Visit (HOSPITAL_BASED_OUTPATIENT_CLINIC_OR_DEPARTMENT_OTHER): Payer: Self-pay

## 2022-01-23 ENCOUNTER — Ambulatory Visit (HOSPITAL_BASED_OUTPATIENT_CLINIC_OR_DEPARTMENT_OTHER): Payer: BLUE CROSS/BLUE SHIELD | Admitting: Cardiovascular Disease

## 2022-01-27 ENCOUNTER — Other Ambulatory Visit (INDEPENDENT_AMBULATORY_CARE_PROVIDER_SITE_OTHER): Payer: Self-pay | Admitting: Family Medicine

## 2022-01-27 DIAGNOSIS — F3289 Other specified depressive episodes: Secondary | ICD-10-CM

## 2022-01-31 ENCOUNTER — Other Ambulatory Visit (HOSPITAL_BASED_OUTPATIENT_CLINIC_OR_DEPARTMENT_OTHER): Payer: Self-pay

## 2022-02-26 ENCOUNTER — Other Ambulatory Visit (INDEPENDENT_AMBULATORY_CARE_PROVIDER_SITE_OTHER): Payer: Self-pay | Admitting: Family Medicine

## 2022-02-26 DIAGNOSIS — E7849 Other hyperlipidemia: Secondary | ICD-10-CM

## 2022-03-08 ENCOUNTER — Other Ambulatory Visit (HOSPITAL_BASED_OUTPATIENT_CLINIC_OR_DEPARTMENT_OTHER): Payer: Self-pay

## 2022-03-08 ENCOUNTER — Other Ambulatory Visit: Payer: Self-pay

## 2022-03-08 ENCOUNTER — Other Ambulatory Visit (INDEPENDENT_AMBULATORY_CARE_PROVIDER_SITE_OTHER): Payer: Self-pay | Admitting: Family Medicine

## 2022-03-08 DIAGNOSIS — F3289 Other specified depressive episodes: Secondary | ICD-10-CM

## 2022-03-08 DIAGNOSIS — E7849 Other hyperlipidemia: Secondary | ICD-10-CM

## 2022-03-09 ENCOUNTER — Other Ambulatory Visit: Payer: Self-pay

## 2022-03-22 ENCOUNTER — Telehealth: Payer: Self-pay | Admitting: Adult Health

## 2022-03-22 ENCOUNTER — Ambulatory Visit (HOSPITAL_BASED_OUTPATIENT_CLINIC_OR_DEPARTMENT_OTHER): Payer: BLUE CROSS/BLUE SHIELD | Admitting: Family

## 2022-03-22 NOTE — Telephone Encounter (Signed)
Pt would like a call to discuss concerns about worsening memory

## 2022-03-22 NOTE — Telephone Encounter (Signed)
Called patient to discuss concern on her memory. Upon review, pt is due for appt. I informed her that she is due for her annual visit. Schedule for 04/25/22 @ 8:15am with a check in with 7:45am. Pt verbalized understanding. Pt had no questions at this time but was encouraged to call back if questions arise.

## 2022-03-23 ENCOUNTER — Other Ambulatory Visit (HOSPITAL_BASED_OUTPATIENT_CLINIC_OR_DEPARTMENT_OTHER): Payer: Self-pay

## 2022-03-23 ENCOUNTER — Encounter (INDEPENDENT_AMBULATORY_CARE_PROVIDER_SITE_OTHER): Payer: Self-pay | Admitting: Family Medicine

## 2022-03-23 ENCOUNTER — Other Ambulatory Visit: Payer: Self-pay | Admitting: Nurse Practitioner

## 2022-03-23 ENCOUNTER — Ambulatory Visit (INDEPENDENT_AMBULATORY_CARE_PROVIDER_SITE_OTHER): Payer: BLUE CROSS/BLUE SHIELD | Admitting: Family Medicine

## 2022-03-23 VITALS — BP 124/70 | HR 90 | Temp 97.9°F | Ht 67.0 in | Wt 216.0 lb

## 2022-03-23 DIAGNOSIS — Z9189 Other specified personal risk factors, not elsewhere classified: Secondary | ICD-10-CM | POA: Diagnosis not present

## 2022-03-23 DIAGNOSIS — E669 Obesity, unspecified: Secondary | ICD-10-CM | POA: Diagnosis not present

## 2022-03-23 DIAGNOSIS — Z6833 Body mass index (BMI) 33.0-33.9, adult: Secondary | ICD-10-CM | POA: Diagnosis not present

## 2022-03-23 DIAGNOSIS — F3289 Other specified depressive episodes: Secondary | ICD-10-CM

## 2022-03-23 DIAGNOSIS — Z1231 Encounter for screening mammogram for malignant neoplasm of breast: Secondary | ICD-10-CM

## 2022-03-23 DIAGNOSIS — E7849 Other hyperlipidemia: Secondary | ICD-10-CM | POA: Diagnosis not present

## 2022-03-23 MED ORDER — SERTRALINE HCL 50 MG PO TABS
50.0000 mg | ORAL_TABLET | Freq: Every day | ORAL | 0 refills | Status: DC
  Filled 2022-03-23: qty 30, 30d supply, fill #0
  Filled 2022-05-03 – 2022-05-10 (×2): qty 30, 30d supply, fill #1

## 2022-03-23 MED ORDER — ROSUVASTATIN CALCIUM 10 MG PO TABS
10.0000 mg | ORAL_TABLET | Freq: Every day | ORAL | 0 refills | Status: DC
  Filled 2022-03-23: qty 90, 90d supply, fill #0

## 2022-03-28 ENCOUNTER — Other Ambulatory Visit (INDEPENDENT_AMBULATORY_CARE_PROVIDER_SITE_OTHER): Payer: Self-pay | Admitting: Family Medicine

## 2022-03-28 ENCOUNTER — Other Ambulatory Visit: Payer: Self-pay | Admitting: Cardiovascular Disease

## 2022-03-28 DIAGNOSIS — F3289 Other specified depressive episodes: Secondary | ICD-10-CM

## 2022-03-28 NOTE — Telephone Encounter (Signed)
Rx(s) sent to pharmacy electronically.  

## 2022-03-29 ENCOUNTER — Telehealth (HOSPITAL_BASED_OUTPATIENT_CLINIC_OR_DEPARTMENT_OTHER): Payer: Self-pay

## 2022-03-29 MED ORDER — LISINOPRIL 20 MG PO TABS: 30.0000 mg | ORAL_TABLET | Freq: Every day | ORAL | 2 refills | Status: DC

## 2022-03-29 NOTE — Telephone Encounter (Signed)
Received fax from [facility] by [place] requesting refills for Lisinopril. Rx request sent to pharmacy.

## 2022-04-03 NOTE — Progress Notes (Signed)
Chief Complaint:   OBESITY Tracey [name] is here to discuss her progress with her obesity treatment plan along with follow-up of her obesity related diagnoses. Tracey [name] is on keeping a food journal and adhering to recommended goals of 1500-1700 calories and 110+ grams of protein and states she is following her eating plan approximately 0% of the time. Tracey [name] states she is exercising 0 minutes 0 times per week.   Today's visit was #: 15 Starting weight: 209 lbs Starting date: 07/21/2020 Today's weight: 216 lbs Today's date: 03/23/2022 Total lbs lost to date: 0 lbs Total lbs lost since last in-office visit: 0  Interim History: Tracey [name] is returning to clinic 1st time since September.  She has been involved in a prolonged court case involving custody of her foster chil.  She feels she really has not made time to practice self care.  Wants to recommit to program.  Subjective:   1. Other hyperlipidemia Tracey [name]'s last LDL of 51, HDL of 40, trig of 40.  On Crestor.  2. At high risk for altered family dynamics Just recently adopted a baby through foster system and dealing with biological mother is really negatively impacting patient's manage home life and her own anxiety.  3. Other depression, with emotional eating Denies suicidal ideas, and homicidal ideas.  Significant increase in carb consumption recently.  Assessment/Plan:   1. Other hyperlipidemia We will refill Crestor 10 mg daily for 3 months with 0 refills.  -Refill rosuvastatin (CRESTOR) 10 MG tablet; Take 1 tablet (10 mg total) by mouth daily.  Dispense: 90 tablet; Refill: 0  2. At high risk for altered family dynamics Discussed at length implementation of boundaries including take time for mother to contact baby, using family visit for all communication that plays outside of patient's home for visit to happen.  3. Other depression, with emotional eating We will refill Sertraline 50 mg daily for 3 months with 0 refills.  -Refill  sertraline (ZOLOFT) 50 MG tablet; Take 1 tablet (50 mg total) by mouth daily.  Dispense: 90 tablet; Refill: 0  4. Obesity with current BMI of 33.9 Tracey [name] is currently in the action stage of change. As such, her goal is to continue with weight loss efforts. She has agreed to keeping a food journal and adhering to recommended goals of 1500-1700 calories and 110+ grams of protein daily.   Exercise goals: All adults should avoid inactivity. Some physical activity is better than none, and adults who participate in any amount of physical activity gain some health benefits.  Behavioral modification strategies: increasing lean protein intake, meal planning and cooking strategies, keeping healthy foods in the home, planning for success, and keeping a strict food journal.  Tracey [name] has agreed to follow-up with our clinic in 3 weeks. She was informed of the importance of frequent follow-up visits to maximize her success with intensive lifestyle modifications for her multiple health conditions.   Objective:   Blood pressure 124/70, pulse 90, temperature 97.9 F (36.6 C), height 5\' 7"  (1.702 m), weight 216 lb (98 kg), SpO2 97 %. Body mass index is 33.83 kg/m.  General: Cooperative, alert, well developed, in no acute distress. HEENT: Conjunctivae and lids unremarkable. Cardiovascular: Regular rhythm.  Lungs: Normal work of breathing. Neurologic: No focal deficits.   Lab Results  Component Value Date   CREATININE 0.98 04/28/2021   BUN 10 04/28/2021   NA 136 04/28/2021   K 4.1 04/28/2021   CL 103 04/28/2021   CO2 21 04/28/2021   Lab  Results  Component Value Date   ALT 15 07/21/2020   AST 13 07/21/2020   ALKPHOS 59 07/21/2020   BILITOT 0.5 07/21/2020   Lab Results  Component Value Date   HGBA1C 5.5 07/21/2020   HGBA1C 5.1 04/03/2020   HGBA1C 5.1 11/10/2017   HGBA1C 5.6 06/04/2017   Lab Results  Component Value Date   INSULIN 16.6 07/21/2020   Lab Results  Component Value Date   TSH  1.750 04/28/2021   Lab Results  Component Value Date   CHOL 102 07/21/2020   HDL 40 07/21/2020   LDLCALC 51 07/21/2020   TRIG 40 07/21/2020   CHOLHDL 4.0 04/03/2020   Lab Results  Component Value Date   VD25OH 59.1 07/21/2020   VD25OH 26.2 (L) 06/04/2017   VD25OH 14.3 (L) 06/23/2015   Lab Results  Component Value Date   WBC 6.6 04/28/2021   HGB 14.1 04/28/2021   HCT 40.3 04/28/2021   MCV 90 04/28/2021   PLT 223 04/28/2021   No results found for: "IRON", "TIBC", "FERRITIN"  Attestation Statements:   Reviewed by clinician on day of visit: allergies, medications, problem list, medical history, surgical history, family history, social history, and previous encounter notes.  Time spent on visit including pre-visit chart review and post-visit care and charting was 40 minutes.   I, Elnora Morrison, RMA am acting as transcriptionist for Coralie Common, MD.  I have reviewed the above documentation for accuracy and completeness, and I agree with the above. - Coralie Common, MD

## 2022-04-07 ENCOUNTER — Other Ambulatory Visit (HOSPITAL_BASED_OUTPATIENT_CLINIC_OR_DEPARTMENT_OTHER): Payer: Self-pay

## 2022-04-07 MED ORDER — FLUOCINOLONE ACETONIDE SCALP 0.01 % EX OIL
TOPICAL_OIL | CUTANEOUS | 1 refills | Status: DC
  Filled 2022-04-07: qty 118.28, 30d supply, fill #0
  Filled 2022-05-03 – 2022-05-10 (×2): qty 118.28, 30d supply, fill #1

## 2022-04-10 ENCOUNTER — Encounter (INDEPENDENT_AMBULATORY_CARE_PROVIDER_SITE_OTHER): Payer: Self-pay

## 2022-04-10 ENCOUNTER — Ambulatory Visit (INDEPENDENT_AMBULATORY_CARE_PROVIDER_SITE_OTHER): Payer: BLUE CROSS/BLUE SHIELD | Admitting: Family Medicine

## 2022-04-16 ENCOUNTER — Telehealth: Payer: BLUE CROSS/BLUE SHIELD | Admitting: Family Medicine

## 2022-04-16 ENCOUNTER — Other Ambulatory Visit (HOSPITAL_BASED_OUTPATIENT_CLINIC_OR_DEPARTMENT_OTHER): Payer: Self-pay

## 2022-04-16 DIAGNOSIS — U071 COVID-19: Secondary | ICD-10-CM | POA: Diagnosis not present

## 2022-04-16 MED ORDER — PREDNISONE 20 MG PO TABS: 20.0000 mg | ORAL_TABLET | Freq: Two times a day (BID) | ORAL | 0 refills | Status: AC

## 2022-04-16 MED ORDER — MOLNUPIRAVIR EUA 200MG CAPSULE: 4.0000 | ORAL_CAPSULE | Freq: Two times a day (BID) | ORAL | 0 refills | Status: AC

## 2022-04-16 MED ORDER — PROMETHAZINE-DM 6.25-15 MG/5ML PO SYRP: 5.0000 mL | ORAL_SOLUTION | Freq: Four times a day (QID) | ORAL | 0 refills | Status: AC | PRN

## 2022-04-16 MED ORDER — MOLNUPIRAVIR EUA 200MG CAPSULE
4.0000 | ORAL_CAPSULE | Freq: Two times a day (BID) | ORAL | 0 refills | Status: DC
  Filled 2022-04-16: qty 40, 5d supply, fill #0

## 2022-04-16 MED ORDER — PROMETHAZINE-DM 6.25-15 MG/5ML PO SYRP
5.0000 mL | ORAL_SOLUTION | Freq: Four times a day (QID) | ORAL | 0 refills | Status: DC | PRN
  Filled 2022-04-16: qty 118, 6d supply, fill #0

## 2022-04-16 MED ORDER — PREDNISONE 20 MG PO TABS
20.0000 mg | ORAL_TABLET | Freq: Two times a day (BID) | ORAL | 0 refills | Status: DC
  Filled 2022-04-16: qty 10, 5d supply, fill #0

## 2022-04-16 NOTE — Patient Instructions (Signed)
Quarantine and Isolation Quarantine and isolation refer to local and travel restrictions to protect the public and travelers from contagious diseases that constitute a public health threat. Contagious diseases are diseases that can spread from one person to another. Quarantine and isolation help to protect the public by preventing exposure to people who have or may have a contagious disease. Isolation separates people who are sick with a contagious disease from people who are not sick. Quarantine separates and restricts the movement of people who were exposed to a contagious disease to see if they become sick. You may be put in quarantine or isolation if you have been exposed to or diagnosed with any of the following diseases: Severe acute respiratory syndromes, such as COVID-19. Cholera. Diphtheria. Tuberculosis. Plague. Smallpox. Yellow fever. Viral hemorrhagic fevers, such as [place], Ebola, and [place]. When to quarantine or isolate Follow these rules, whether you have been vaccinated or not: Stay home and isolate from others when you are sick with a contagious disease. Isolate when you test positive for a contagious disease, even if you do not have symptoms. Isolate if you are sick and suspect that you may have a contagious disease. If you suspect that you have a contagious disease, get tested. If your test results are negative, you can end your isolation. If your test results are positive, follow the full isolation recommendations as told by your health care provider or local health authorities. Quarantine and stay away from others when you have been in close contact with someone who has tested positive for a contagious disease. Close contact is defined as being less than 6 ft (1.8 m) away from an infected person for a total of 15 minutes or more over a 24-hour period. Do not go to places where you are unable to wear a mask, such as restaurants and some gyms. Stay home and separate  from others as much as possible. Avoid being around people who may get very sick from the contagious disease that you have. Use a separate bathroom, if possible. Do not travel. For travel guidance, visit the CDC's travel webpage at wwwnc.cdc.gov/travel/ Follow these instructions at home: Medicines  Take over-the-counter and prescription medicines as told by your health care provider. Finish all antibiotic medicine even when you start to feel better. Stay up to date with all your vaccines. Get scheduled vaccines and boosters as recommended by your health care provider. Lifestyle Wear a high-quality mask if you must be around others at home and in public, if recommended. Improve air flow (ventilation) at home to help prevent the disease from spreading to other people, if possible. Do not share personal household items, like cups, towels, and utensils. Practice everyday hygiene and cleaning. General instructions Talk to your health care provider if you have a weakened body defense system (immune system). People with a weakened immune system may have a reduced immune response to vaccines. You may need to follow current prevention measures, including wearing a well-fitting mask, avoiding crowds, and avoiding poorly ventilated indoor places. Monitor symptoms and follow health care provider instructions, which may include resting, drinking fluids, and taking medicines. Follow specific isolation and quarantine recommendations if you are in places that can lead to disease outbreaks, such as correctional and detention facilities, homeless shelters, and cruise ships. Return to your normal activities as told by your health care provider. Ask your health care provider what activities are safe for you. Keep all follow-up visits. This is important. Where to find more information CDC: www.cdc.gov/quarantine/index.html Contact   a health care provider if: You have a fever. You have signs and symptoms that  return or get worse after isolation. Get help right away if: You have difficulty breathing. You have chest pain. These symptoms may be an emergency. Get help right away. Call 911. Do not wait to see if the symptoms will go away. Do not drive yourself to the hospital. Summary Isolation and quarantine help protect the public by preventing exposure to people who have or may have a contagious disease. Isolate when you are sick or when you test positive, even if you do not have symptoms. Quarantine and stay away from others when you have been in close contact with someone who has tested positive for a contagious disease. This information is not intended to replace advice given to you by your health care provider. Make sure you discuss any questions you have with your health care provider. Document Revised: 03/17/2021 Document Reviewed: 02/24/2021 Elsevier Patient Education  2023 [employer] COVID-19 COVID-19, or coronavirus disease 2019, is an infection that is caused by a new (novel) coronavirus called SARS-CoV-2. COVID-19 can cause many symptoms. In some people, the virus may not cause any symptoms. In others, it may cause mild or severe symptoms. Some people with severe infection develop severe disease. What are the causes? This illness is caused by a virus. The virus may be in the air as tiny specks of fluid (aerosols) or droplets, or it may be on surfaces. You may catch the virus by: Breathing in droplets from an infected person. Droplets can be spread by a person breathing, speaking, singing, coughing, or sneezing. Touching something, like a table or a doorknob, that has virus on it (is contaminated) and then touching your mouth, nose, or eyes. What increases the risk? Risk for infection: You are more likely to get infected with the COVID-19 virus if: You are within 6 ft (1.8 m) of a person with COVID-19 for 15 minutes or longer. You are providing care for a person who is infected with  COVID-19. You are in close personal contact with other people. Close personal contact includes hugging, kissing, or sharing eating or drinking utensils. Risk for serious illness caused by COVID-19: You are more likely to get seriously ill from the COVID-19 virus if: You have cancer. You have a long-term (chronic) disease, such as: Chronic lung disease. This includes pulmonary embolism, chronic obstructive pulmonary disease, and cystic fibrosis. Long-term disease that lowers your body's ability to fight infection (immunocompromise). Serious cardiac conditions, such as heart failure, coronary artery disease, or cardiomyopathy. Diabetes. Chronic kidney disease. Liver diseases. These include cirrhosis, nonalcoholic fatty liver disease, alcoholic liver disease, or autoimmune hepatitis. You have obesity. You are pregnant or were recently pregnant. You have sickle cell disease. What are the signs or symptoms? Symptoms of this condition can range from mild to severe. Symptoms may appear any time from 2 to 14 days after being exposed to the virus. They include: Fever or chills. Shortness of breath or trouble breathing. Feeling tired or very tired. Headaches, body aches, or muscle aches. Runny or stuffy nose, sneezing, coughing, or sore throat. New loss of taste or smell. This is rare. Some people may also have stomach problems, such as nausea, vomiting, or diarrhea. Other people may not have any symptoms of COVID-19. How is this diagnosed? This condition may be diagnosed by testing samples to check for the COVID-19 virus. The most common tests are the PCR test and the antigen test. Tests may be done   in the lab or at home. They include: Using a swab to take a sample of fluid from the back of your nose and throat (nasopharyngeal fluid), from your nose, or from your throat. Testing a sample of saliva from your mouth. Testing a sample of coughed-up mucus from your lungs (sputum). How is this  treated? Treatment for COVID-19 infection depends on the severity of the condition. Mild symptoms can be managed at home with rest, fluids, and over-the-counter medicines. Serious symptoms may be treated in a hospital intensive care unit (ICU). Treatment in the ICU may include: Supplemental oxygen. Extra oxygen is given through a tube in the nose, a face mask, or a hood. Medicines. These may include: Antivirals, such as monoclonal antibodies. These help your body fight off certain viruses that can cause disease. Anti-inflammatories, such as corticosteroids. These reduce inflammation and suppress the immune system. Antithrombotics. These prevent or treat blood clots, if they develop. Convalescent plasma. This helps boost your immune system, if you have an underlying immunosuppressive condition or are getting immunosuppressive treatments. Prone positioning. This means you will lie on your stomach. This helps oxygen to get into your lungs. Infection control measures. If you are at risk for more serious illness caused by COVID-19, your health care provider may prescribe two long-acting monoclonal antibodies, given together every 6 months. How is this prevented? To protect yourself: Use preventive medicine (pre-exposure prophylaxis). You may get pre-exposure prophylaxis if you have moderate or severe immunocompromise. Get vaccinated. Anyone 6 months old or older who meets guidelines can get a COVID-19 vaccine or vaccine series. This includes people who are pregnant or making breast milk (lactating). Get an added dose of COVID-19 vaccine after your first vaccine or vaccine series if you have moderate to severe immunocompromise. This applies if you have had a solid organ transplant or have been diagnosed with an immunocompromising condition. You should get the added dose 4 weeks after you got the first COVID-19 vaccine or vaccine series. If you get an mRNA vaccine, you will need a 3-dose primary  series. If you get the J&J/Janssen vaccine, you will need a 2-dose primary series, with the second dose being an mRNA vaccine. Talk to your health care provider about getting experimental monoclonal antibodies. This treatment is approved under emergency use authorization to prevent severe illness before or after being exposed to the COVID-19 virus. You may be given monoclonal antibodies if: You have moderate or severe immunocompromise. This includes treatments that lower your immune response. People with immunocompromise may not develop protection against COVID-19 when they are vaccinated. You cannot be vaccinated. You may not get a vaccine if you have a severe allergic reaction to the vaccine or its components. You are not fully vaccinated. You are in a facility where COVID-19 is present and: Are in close contact with a person who is infected with the COVID-19 virus. Are at high risk of being exposed to the COVID-19 virus. You are at risk of illness from new variants of the COVID-19 virus. To protect others: If you have symptoms of COVID-19, take steps to prevent the virus from spreading to others. Stay home. Leave your house only to get medical care. Do not use public transit, if possible. Do not travel while you are sick. Wash your hands often with soap and water for at least 20 seconds. If soap and water are not available, use alcohol-based hand sanitizer. Make sure that all people in your household wash their hands well and often. Cough or sneeze   into a tissue or your sleeve or elbow. Do not cough or sneeze into your hand or into the air. Where to find more information Centers for Disease Control and Prevention: www.cdc.gov/coronavirus World Health Organization: www.who.int/health-topics/coronavirus Get help right away if: You have trouble breathing. You have pain or pressure in your chest. You are confused. You have bluish lips and fingernails. You have trouble waking from sleep. You  have symptoms that get worse. These symptoms may be an emergency. Get help right away. Call 911. Do not wait to see if the symptoms will go away. Do not drive yourself to the hospital. Summary COVID-19 is an infection that is caused by a new coronavirus. Sometimes, there are no symptoms. Other times, symptoms range from mild to severe. Some people with a severe COVID-19 infection develop severe disease. The virus that causes COVID-19 can spread from person to person through droplets or aerosols from breathing, speaking, singing, coughing, or sneezing. Mild symptoms of COVID-19 can be managed at home with rest, fluids, and over-the-counter medicines. This information is not intended to replace advice given to you by your health care provider. Make sure you discuss any questions you have with your health care provider. Document Revised: 02/22/2021 Document Reviewed: 02/24/2021 Elsevier Patient Education  2023 [employer]  

## 2022-04-16 NOTE — Addendum Note (Signed)
Addended by: Mar Daring on: 04/16/2022 12:13 PM   Modules accepted: Orders

## 2022-04-16 NOTE — Progress Notes (Signed)
Virtual Visit Consent   Tracey [name], you are scheduled for a virtual visit with a [facility] provider today. Just as with appointments in the office, your consent must be obtained to participate. Your consent will be active for this visit and any virtual visit you may have with one of our providers in the next 365 days. If you have a MyChart account, a copy of this consent can be sent to you electronically.  As this is a virtual visit, video technology does not allow for your provider to perform a traditional examination. This may limit your provider's ability to fully assess your condition. If your provider identifies any concerns that need to be evaluated in person or the need to arrange testing (such as labs, EKG, etc.), we will make arrangements to do so. Although advances in technology are sophisticated, we cannot ensure that it will always work on either your end or our end. If the connection with a video visit is poor, the visit may have to be switched to a telephone visit. With either a video or telephone visit, we are not always able to ensure that we have a secure connection.  By engaging in this virtual visit, you consent to the provision of healthcare and authorize for your insurance to be billed (if applicable) for the services provided during this visit. Depending on your insurance coverage, you may receive a charge related to this service.  I need to obtain your verbal consent now. Are you willing to proceed with your visit today? Tracey [name] has provided verbal consent on 04/16/2022 for a virtual visit (video or telephone). Dellia Nims, FNP  Date: 04/16/2022 10:38 AM  Virtual Visit via Video Note   I, Dellia Nims, connected with  Tracey [name]  ([id number], [birth year]) on 04/16/22 at 10:15 AM EST by a video-enabled telemedicine application and verified that I am speaking with the correct person using two identifiers.  Location: Patient: Virtual Visit Location Patient:  Home Provider: Virtual Visit Location Provider: Home Office   I discussed the limitations of evaluation and management by telemedicine and the availability of in person appointments. The patient expressed understanding and agreed to proceed.    History of Present Illness: Tracey [name] is a 47 y.o. who identifies as a female who was assigned female at birth, and is being seen today for covid positive testing. Sx started yesterday. Her husband has had it since earlier this. Week. She complains of cough, wheezing, chills, headache, and body aches. She has a history of HTN and CVA.   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   Skin inflammation 08/18/2021   Other hyperlipidemia 08/16/2021   Class 1 obesity with serious comorbidity and body mass index (BMI) of 33.0 to 33.9 in adult 08/16/2021   Abnormal screening mammogram 08/16/2021   Psychophysiological insomnia 07/08/2020   Chronic migraine without aura without status migrainosus, not intractable 07/08/2020   S/P percutaneous patent foramen ovale closure 06/16/2020   History of stroke 05/31/2020   PFO (patent foramen ovale) - ROPE score 5 05/31/2020   Hypokalemia 04/03/2020   Hypertension    Depression    Anxiety 11/27/2017   Vitamin D deficiency 06/07/2017    Allergies: No Known Allergies Medications:  Current Outpatient Medications:    molnupiravir EUA (LAGEVRIO) 200 mg CAPS capsule, Take 4 capsules (800 mg total) by mouth 2 (two) times daily for 5 days., Disp: 40 capsule,  Rfl: 0   predniSONE (DELTASONE) 20 MG tablet, Take 1 tablet (20 mg total) by mouth 2 (two) times daily with a meal for 5 days., Disp: 10 tablet, Rfl: 0   promethazine-dextromethorphan (PROMETHAZINE-DM) 6.25-15 MG/5ML syrup, Take 5 mLs by mouth 4 (four) times daily as needed for up to 10 days for cough., Disp: 118 mL, Rfl: 0   aspirin 81 MG EC tablet, Take 81 mg by mouth daily. Swallow whole., Disp: ,  Rfl:    buPROPion (WELLBUTRIN SR) 150 MG 12 hr tablet, Take 1 tablet (150 mg total) by mouth daily in the morning., Disp: 90 tablet, Rfl: 0   eszopiclone (LUNESTA) 2 MG TABS tablet, Take 1 tablet (2 mg total) by mouth at bedtime as needed for sleep. Take immediately before bedtime, Disp: 90 tablet, Rfl: 0   Fluocinolone Acetonide Scalp (DERMA-SMOOTHE/FS SCALP) 0.01 % OIL, Apply to affected scalp 3-4 times per week. Do not apply to face., Disp: 118.28 mL, Rfl: 1   ibuprofen (ADVIL) 600 MG tablet, Take 1 tablet (600 mg total) by mouth every 8 (eight) hours as needed for moderate pain., Disp: 30 tablet, Rfl: 0   levocetirizine (XYZAL) 5 MG tablet, Take 1 tablet (5 mg total) by mouth every evening., Disp: 90 tablet, Rfl: 0   levonorgestrel (MIRENA, 52 MG,) 20 MCG/24HR IUD, 1 Intra Uterine Device (1 each total) by Intrauterine route once for 1 dose., Disp: 1 each, Rfl: 0   lisinopril (ZESTRIL) 20 MG tablet, Take 1.5 tablets (30 mg total) by mouth daily., Disp: 135 tablet, Rfl: 2   Multiple Vitamins-Minerals (MULTIVITAMIN WITH MINERALS) tablet, Take 1 tablet by mouth daily., Disp: , Rfl:    Probiotic Product (PROBIOTIC DAILY PO), Take 1 tablet by mouth daily. , Disp: , Rfl:    rosuvastatin (CRESTOR) 10 MG tablet, Take 1 tablet (10 mg total) by mouth daily., Disp: 90 tablet, Rfl: 0   sertraline (ZOLOFT) 50 MG tablet, Take 1 tablet (50 mg total) by mouth daily., Disp: 90 tablet, Rfl: 0   Ubrogepant (UBRELVY) 100 MG TABS, TAKE ONE (1) TABLET BY MOUTH DAILY AS NEEDED., Disp: 30 tablet, Rfl: 1   Vitamin D, Ergocalciferol, (DRISDOL) 1.25 MG (50000 UNIT) CAPS capsule, Take 1 capsule (50,000 Units total) by mouth every 7 (seven) days., Disp: 12 capsule, Rfl: 3  Observations/Objective: Patient is well-developed, well-nourished in no acute distress.  Resting comfortably  at home.  Head is normocephalic, atraumatic.  No labored breathing.  Speech is clear and coherent with logical content.  Patient is alert and  oriented at baseline.    Assessment and Plan: 1. COVID  Increase fluids, humidifier at night, tylenol as directed, mucinex, MVI, Urgent care if sx persist or worsen.   Follow Up Instructions: I discussed the assessment and treatment plan with the patient. The patient was provided an opportunity to ask questions and all were answered. The patient agreed with the plan and demonstrated an understanding of the instructions.  A copy of instructions were sent to the patient via MyChart unless otherwise noted below.     The patient was advised to call back or seek an in-person evaluation if the symptoms worsen or if the condition fails to improve as anticipated.  Time:  I spent 10 minutes with the patient via telehealth technology discussing the above problems/concerns.    Dellia Nims, FNP

## 2022-04-17 ENCOUNTER — Ambulatory Visit (HOSPITAL_BASED_OUTPATIENT_CLINIC_OR_DEPARTMENT_OTHER): Payer: BLUE CROSS/BLUE SHIELD | Admitting: Family

## 2022-04-18 NOTE — Telephone Encounter (Signed)
Please reschedule appt

## 2022-04-19 ENCOUNTER — Ambulatory Visit (HOSPITAL_BASED_OUTPATIENT_CLINIC_OR_DEPARTMENT_OTHER): Payer: BLUE CROSS/BLUE SHIELD | Admitting: Family Medicine

## 2022-04-19 NOTE — Telephone Encounter (Signed)
No refills needed.

## 2022-04-24 NOTE — Progress Notes (Deleted)
[facility] [address] [place]. [place] [address] [phone]       STROKE FOLLOW UP NOTE  Ms. Dimonique Bering Crumpler Date of Birth:  Aug 07, 1975 Medical Record Number:  [id number]   Reason for Referral: stroke follow up     SUBJECTIVE:    HPI:   Update 04/25/2022 JM: Patient returns due to concern of worsening memory.  She was previously seen 1 year ago for stroke follow-up and released as she was stable.   Reports compliance on aspirin and Crestor Blood pressure well-controlled      History provided for reference purposes only Update 04/05/2021 JM: patient being seen for 5 month stroke follow up via MyChart VV per pt request. Overall stable from stroke standpoint without new stroke/TIA symptoms.  Residual occasional word finding difficulty and occassional short term memory difficulties stable.  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 C/o worsening vision since stroke - difficulty seeing further away.  Compliant on aspirin and Crestor without side effects. C/o worsening insomnia over the past 1.5 months. Will fall asleep with TV on. Will wake up around 1am and have difficulty falling back asleep. On Lunesta '2mg'$  nightly but difficulty tolerating due to morning grogginess.  No further concerns at this time.  Update 11/03/2020 JM: Ms. Berhow is being seen for 3 month stroke follow up unaccompanied. Overall stable.  Speech has been greatly improving since prior visit.  Occasional word finding difficulty more so when speaking to those she is not familiar with or with increased anxiety.  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  Management has been very supportive and has been slowly relearning prior job duties and training as needed.  Also reports fluctuation of left hand fingertips and tips of toes on left foot  numbness especially in the evening while relaxing - present since stroke onset - not new.  Recently started on sertraline for anxiety worsened post stroke.  denies new stroke/TIA symptoms. Compliant on aspirin, Plavix and Crestor-denies associated side effects.  Blood pressure today 139/88.  Being followed by healthy weight and wellness with a 5 pound weight loss since May.  Followed by Dr. Brett Fairy for insomnia on Lunesta with benefit.  No further concerns at this time.  Update 08/02/2020 JM: Mrs. Kolek returns for 48-monthstroke follow-up accompanied by her 221yo daughter  Doing well since prior visit from stroke standpoint without new stroke/TIA symptoms.  Residual cognitive impairment gradually improving with greatest difficulty with recall or getting words together especially when talking to somebody she does not know or with increased anxiety (per SLP difficulties with thought organization and managing some day-to-day tasks).  She continues to work routinely with neuro rehab SLP.  She remains on short term disability and is eager to return back to work at [employer]   Reports compliance on aspirin, plavix (post PFO closure) and Crestor without associated side effects Blood pressure today 135/83  S/p PFO closure [id number] complication Sleep study 06/13/2020 negative for sleep apnea (although only captured 3.5 hours of sleep however all sleep stages were captured) -chronic history of insomnia and migraines recently started on Lunesta and Ubrelvy per PCP with benefit.  Also on bupropion for depression/anxiety.  No further concerns at this time  Initial visit 05/03/2020 JM: Mrs. Endicott is being seen for hospital follow-up accompanied by her husband.  She  reports gradual improvement since discharge with residual LLE weakness (feels like knee wants to hyperextend), gait impairment and cognitive impairment.  Currently working with PT and has initial evaluation  with SLP tomorrow for cognitive therapy.  She has not returned back to work currently on short-term disability working for [employer].  Baseline depression/anxiety with worsening anxiety and depression but also self discontinued bupropion at hospital discharge as she was fearful of possible increased stroke risk with continued use.  Reports increased fatigue since her stroke with fatigue PTA as well as snoring, witnessed apnea and insomnia.  She has not previously underwent sleep study.  Denies new stroke/TIA symptoms.  She has completed 3 weeks DAPT and remains on aspirin alone without bleeding or bruising.  Remains on Crestor 10 mg daily without myalgias.  Blood pressure today 149/77.  She does admit to smoking 1 cigarette/day with goal of complete tobacco cessation in the near future.  TEE completed 04/16/2020 which showed evidence of PFO.  Initial evaluation with Dr. Burt Knack for possible closure scheduled on 05/28/2020.  Hypercoagulable labs negative.  No further concerns at this time.  Stroke admission 04/02/2020 Ms. Temaka Willenbring is a 47 y.o. female with history of hypertension, anxiety (panic attacks), headaches (migraines), ongoing tobacco use, and depression (just started Wellbutrin on the day of admission)  who presented to the [facility] emergency department  on 04/02/2020 and seen by teleNeurology for acute onset neck discomfort, left leg numbness / weakness, left arm numbness and a pressure sensation at the base of her posterior neck in the midline.   Personally reviewed hospitalization pertinent progress notes, lab work and imaging with summary provided.  Shortly transferred to [facility] evaluated by Dr. Leonie Man with stroke work-up revealing small acute infarct in the juxtacortical high right posterior frontal lobe, possibly embolic secondary to unknown source.  Recommend completion of TEE outpatient.  Hypercoagulable labs pending at discharge.  Recommended DAPT for 3 weeks and aspirin  alone. LDL 101 and initiated Crestor 10 mg daily.  A1c 5.1.  Other stroke risk factors include current tobacco use, obesity and history of migraines but no prior stroke history.  Other active problems include suspected sleep apnea, bradycardia and hypokalemia.  Evaluated by therapies and recommended outpatient PT and discharged home in stable condition.  Stroke: Small acute infarct in the juxtacortical high right posterior frontal lobe possibly embolic - etiology unknown. Resultant mild left leg weakness and numbness which is resolving Code Stroke CT Head -  Normal head CT. ASPECTS is 10.     CT head - not ordered MRI head - Small acute infarct in the juxtacortical high right posterior frontal lobe. Minimal edema without mass effect. Additional mild scattered T2/FLAIR hyperintensities within the white matter, mildly advanced for age. These are nonspecific and could relate to chronic microvascular ischemic disease, prior migraines, trauma, demyelination, or inflammation. CTA H&N - normal 2D Echo - EF 55 - 60%. No cardiac source of emboli identified.  [employer] Virus 2 - negative LDL - 101 HgbA1c - 5.1 UDS - negative VTE prophylaxis - Lovenox No antithrombotic prior to admission, now on aspirin 81 mg daily and clopidogrel 75 mg daily for 3 weeks then aspirin alone Patient will be counseled to be compliant with her antithrombotic medications Ongoing aggressive stroke risk factor management Therapy recommendations:  Outpt PT Disposition:  Home     ROS:   14 system review of systems performed and negative with exception of those listed in HPI  PMH:  Past  Medical History:  Diagnosis Date   Anxiety    Bronchitis    Carpal tunnel syndrome on both sides    Constipation    Depression    Family history of adverse reaction to anesthesia    mother had problems waking up from surgery.   Gestational diabetes    Gestational diabetes mellitus    Normal 2 hr GTT postpartum   Headache     migraines   High cholesterol    Hypertension    Infertility, female    Lactose intolerance    Oligohydramnios antepartum 11/09/2017   Palpitations    Panic attack 2017   diagnosed 3 months ago. no meds presently   Reflux    did not filll prescription   S/P percutaneous patent foramen ovale closure 06/16/2020   s/p PFO occluder device with a a 25 MM AMPLATZER by Dr. Burt Knack   Stroke [facility])    Swelling    Vitamin D deficiency     PSH:  Past Surgical History:  Procedure Laterality Date   BREAST EXCISIONAL BIOPSY Right    cyst   BUBBLE STUDY  04/16/2020   Procedure: BUBBLE STUDY;  Surgeon: Skeet Latch, MD;  Location: [place];  Service: Cardiovascular;;   DILATION AND EVACUATION N/A 07/26/2015   Procedure: DILATATION AND EVACUATION;  Surgeon: Shelly Bombard, MD;  Location: [facility];  Service: Gynecology;  Laterality: N/A;   PATENT FORAMEN OVALE(PFO) CLOSURE N/A 06/16/2020   Procedure: PATENT FORAMEN OVALE (PFO) CLOSURE;  Surgeon: Sherren Mocha, MD;  Location: [facility];  Service: Cardiovascular;  Laterality: N/A;   TEE WITHOUT CARDIOVERSION N/A 04/16/2020   Procedure: TRANSESOPHAGEAL ECHOCARDIOGRAM (TEE);  Surgeon: Skeet Latch, MD;  Location: [facility];  Service: Cardiovascular;  Laterality: N/A;    Social History:  Social History   Socioeconomic History   Marital status: Married    Spouse name: Not on file   Number of children: Not on file   Years of education: Not on file   Highest education level: Not on file  Occupational History   Occupation: Not Working  Tobacco Use   Smoking status: Every Day    Packs/day: 0.25    Types: Cigarettes   Smokeless tobacco: Never   Tobacco comments:    4 cig/day  Vaping Use   Vaping Use: Never used  Substance and Sexual Activity   Alcohol use: No    Alcohol/week: 0.0 standard drinks of alcohol   Drug use: No   Sexual activity: Yes    Birth control/protection: I.U.D.  Other Topics Concern   Not on  file  Social History Narrative   Lives at home with spouse and her 2 children   Right handed   Caffeine: 2 cups/day   Social Determinants of Health   Financial Resource Strain: Low Risk  (12/30/2020)   Overall Financial Resource Strain (CARDIA)    Difficulty of Paying Living Expenses: Not hard at all  Food Insecurity: No Food Insecurity (12/30/2020)   Hunger Vital Sign    Worried About Running Out of Food in the Last Year: Never true    Ran Out of Food in the Last Year: Never true  Transportation Needs: No Transportation Needs (12/30/2020)   PRAPARE - Hydrologist (Medical): No    Lack of Transportation (Non-Medical): No  Physical Activity: Sufficiently Active (12/30/2020)   Exercise Vital Sign    Days of Exercise per Week: 5 days    Minutes of Exercise per Session:  30 min  Stress: Not on file  Social Connections: Not on file  Intimate Partner Violence: Not on file    Family History:  Family History  Problem Relation Age of Onset   Hypertension Mother    Anxiety disorder Father    Depression Father    Alcoholism Father    Diabetes Maternal Aunt    Diabetes Maternal Uncle    Breast cancer Paternal Aunt     Medications:   Current Outpatient Medications on File Prior to Visit  Medication Sig Dispense Refill   aspirin 81 MG EC tablet Take 81 mg by mouth daily. Swallow whole.     buPROPion (WELLBUTRIN SR) 150 MG 12 hr tablet Take 1 tablet (150 mg total) by mouth daily in the morning. 90 tablet 0   eszopiclone (LUNESTA) 2 MG TABS tablet Take 1 tablet (2 mg total) by mouth at bedtime as needed for sleep. Take immediately before bedtime 90 tablet 0   Fluocinolone Acetonide Scalp (DERMA-SMOOTHE/FS SCALP) 0.01 % OIL Apply to affected scalp 3-4 times per week. Do not apply to face. 118.28 mL 1   ibuprofen (ADVIL) 600 MG tablet Take 1 tablet (600 mg total) by mouth every 8 (eight) hours as needed for moderate pain. 30 tablet 0   levocetirizine (XYZAL) 5  MG tablet Take 1 tablet (5 mg total) by mouth every evening. 90 tablet 0   levonorgestrel (MIRENA, 52 MG,) 20 MCG/24HR IUD 1 Intra Uterine Device (1 each total) by Intrauterine route once for 1 dose. 1 each 0   lisinopril (ZESTRIL) 20 MG tablet Take 1.5 tablets (30 mg total) by mouth daily. 135 tablet 2   Multiple Vitamins-Minerals (MULTIVITAMIN WITH MINERALS) tablet Take 1 tablet by mouth daily.     Probiotic Product (PROBIOTIC DAILY PO) Take 1 tablet by mouth daily.      promethazine-dextromethorphan (PROMETHAZINE-DM) 6.25-15 MG/5ML syrup Take 5 mLs by mouth 4 (four) times daily as needed for up to 10 days for cough. 118 mL 0   rosuvastatin (CRESTOR) 10 MG tablet Take 1 tablet (10 mg total) by mouth daily. 90 tablet 0   sertraline (ZOLOFT) 50 MG tablet Take 1 tablet (50 mg total) by mouth daily. 90 tablet 0   Ubrogepant (UBRELVY) 100 MG TABS TAKE ONE (1) TABLET BY MOUTH DAILY AS NEEDED. 30 tablet 1   Vitamin D, Ergocalciferol, (DRISDOL) 1.25 MG (50000 UNIT) CAPS capsule Take 1 capsule (50,000 Units total) by mouth every 7 (seven) days. 12 capsule 3   No current facility-administered medications on file prior to visit.    Allergies:  No Known Allergies    OBJECTIVE:  Physical Exam General: well developed, well nourished, seated, in no evident distress Head: head normocephalic and atraumatic.   Neck: supple with no carotid or supraclavicular bruits Cardiovascular: regular rate and rhythm, no murmurs Musculoskeletal: no deformity Skin:  no rash/petichiae Vascular:  Normal pulses all extremities   Neurologic Exam Mental Status: Awake and fully alert. Oriented to place and time. Recent and remote memory intact. Attention span, concentration and fund of knowledge appropriate. Mood and affect appropriate.  Cranial Nerves: Fundoscopic exam reveals sharp disc margins. Pupils equal, briskly reactive to light. Extraocular movements full without nystagmus. Visual fields full to confrontation.  Hearing intact. Facial sensation intact. Face, tongue, palate moves normally and symmetrically.  Motor: Normal bulk and tone. Normal strength in all tested extremity muscles Sensory.: intact to touch , pinprick , position and vibratory sensation.  Coordination: Rapid alternating movements normal in all  extremities. Finger-to-nose and heel-to-shin performed accurately bilaterally. Gait and Station: Arises from chair without difficulty. Stance is normal. Gait demonstrates normal stride length and balance ***. Tandem walk and heel toe ***.  Reflexes: 1+ and symmetric. Toes downgoing.    General: well developed, well nourished, pleasant middle-age African-American female, seated, in no evident distress Head: head normocephalic and atraumatic.    Neurologic Exam Mental Status: Awake and fully alert.  Occasional speech hesitancy.  Oriented to place and time. Recent and remote memory intact. Attention span, concentration and fund of knowledge appropriate. Mood and affect appropriate.  Cranial Nerves: Extraocular movements full without nystagmus. Hearing intact to voice. Facial sensation intact. Face, tongue, palate moves normally and symmetrically.  Shoulder shrug symmetric. Motor: No evidence of weakness per drift assessment Coordination: Rapid alternating movements normal in all extremities. Finger-to-nose and heel-to-shin performed accurately bilaterally.       ASSESSMENT: RAENGEL COLEE is a 47 y.o. year old female with small acute infarct in the juxtacortical high right posterior frontal lobe on [id number] possibly embolic secondary to unknown source although evidence of PFO. Vascular risk factors include HTN, HLD, tobacco use, obesity, migraines and PFO     PLAN:  R frontal lobe stroke, cryptogenic:  Residual deficit: Cognitive communication deficit -stable Worsening vision - present since stroke. Unsure if actually stroke related or coincidental as reports difficulty seeing objects  further way.  Advised to follow-up with her eye doctor for evaluation PFO s/p closure 06/16/2020 Hypercoagulable labs and 30-day cardiac event monitor negative Continue aspirin 81 mg daily  and Crestor 10 mg daily for secondary stroke prevention.   Discussed secondary stroke prevention measures and importance of close PCP follow up for aggressive stroke risk factor management including BP goal<130/90, and HLD with LDL goal<70  PFO: As evidenced on TEE 04/16/2020 S/p closure 06/16/2020 for ROPE score 5 indicating 34% chance that stroke was due to PFO Followed by cardiology  Chronic insomnia: Followed by Dr. Brett Fairy on [place]. C/o worsening past 1.5 months and difficulty tolerating Lunesta due to grogginess the following day.  Discussed improving sleep habits and good sleep hygiene.  If persists, advised her to schedule a f/u visit with Dr. Brett Fairy     Stable from stroke standpoint -follow-up as needed   CC:  [place] provider: Dr. Elisha Ponder, Coralee Pesa, NP    I spent 38 minutes of face-to-face and non-face-to-face time with patient via [place] video visit.  This included previsit chart review, lab review, study review, electronic health record documentation, patient education and discussion regarding above diagnoses and treatment plan and answered all the questions to patient's satisfaction   Frann Rider, [facility]  [facility] [address] [place], [place] [address]  Phone [phone] Fax [phone] Note: This document was prepared with digital dictation and possible smart phrase technology. Any transcriptional errors that result from this process are unintentional.

## 2022-04-25 ENCOUNTER — Encounter: Payer: Self-pay | Admitting: Adult Health

## 2022-04-25 ENCOUNTER — Ambulatory Visit: Payer: BLUE CROSS/BLUE SHIELD | Admitting: Adult Health

## 2022-05-03 ENCOUNTER — Encounter (HOSPITAL_BASED_OUTPATIENT_CLINIC_OR_DEPARTMENT_OTHER): Payer: Self-pay

## 2022-05-03 ENCOUNTER — Other Ambulatory Visit (INDEPENDENT_AMBULATORY_CARE_PROVIDER_SITE_OTHER): Payer: Self-pay | Admitting: Family Medicine

## 2022-05-03 ENCOUNTER — Other Ambulatory Visit (HOSPITAL_BASED_OUTPATIENT_CLINIC_OR_DEPARTMENT_OTHER): Payer: Self-pay

## 2022-05-03 DIAGNOSIS — F3289 Other specified depressive episodes: Secondary | ICD-10-CM

## 2022-05-03 MED ORDER — WEGOVY 0.25 MG/0.5ML ~~LOC~~ SOAJ
SUBCUTANEOUS | 0 refills | Status: DC
  Filled 2022-05-03 (×2): qty 2, 28d supply, fill #0

## 2022-05-04 ENCOUNTER — Other Ambulatory Visit (HOSPITAL_BASED_OUTPATIENT_CLINIC_OR_DEPARTMENT_OTHER): Payer: Self-pay

## 2022-05-05 ENCOUNTER — Encounter (HOSPITAL_BASED_OUTPATIENT_CLINIC_OR_DEPARTMENT_OTHER): Payer: Self-pay

## 2022-05-05 ENCOUNTER — Other Ambulatory Visit (HOSPITAL_BASED_OUTPATIENT_CLINIC_OR_DEPARTMENT_OTHER): Payer: Self-pay

## 2022-05-10 ENCOUNTER — Other Ambulatory Visit (HOSPITAL_BASED_OUTPATIENT_CLINIC_OR_DEPARTMENT_OTHER): Payer: Self-pay

## 2022-05-12 ENCOUNTER — Other Ambulatory Visit (HOSPITAL_BASED_OUTPATIENT_CLINIC_OR_DEPARTMENT_OTHER): Payer: Self-pay

## 2022-05-15 ENCOUNTER — Other Ambulatory Visit (HOSPITAL_BASED_OUTPATIENT_CLINIC_OR_DEPARTMENT_OTHER): Payer: Self-pay

## 2022-05-17 ENCOUNTER — Other Ambulatory Visit (HOSPITAL_BASED_OUTPATIENT_CLINIC_OR_DEPARTMENT_OTHER): Payer: Self-pay

## 2022-05-19 ENCOUNTER — Ambulatory Visit (HOSPITAL_BASED_OUTPATIENT_CLINIC_OR_DEPARTMENT_OTHER): Payer: BLUE CROSS/BLUE SHIELD | Admitting: Family Medicine

## 2022-05-19 ENCOUNTER — Ambulatory Visit (HOSPITAL_BASED_OUTPATIENT_CLINIC_OR_DEPARTMENT_OTHER): Payer: BLUE CROSS/BLUE SHIELD | Admitting: Family

## 2022-05-19 NOTE — Progress Notes (Deleted)
Office Visit    Patient Name: Tracey [name] Date of Encounter: 05/19/2022  PCP:  Orma Render, NP   [place]  Cardiologist:  Skeet Latch, MD  Advanced Practice Provider:  No care team member to display Electrophysiologist:  None      Chief Complaint    Tracey [name] is a 47 y.o. female presents today for hypertension follow-up  Past Medical History    Past Medical History:  Diagnosis Date   Anxiety    Bronchitis    Carpal tunnel syndrome on both sides    Constipation    Depression    Family history of adverse reaction to anesthesia    mother had problems waking up from surgery.   Gestational diabetes    Gestational diabetes mellitus    Normal 2 hr GTT postpartum   Headache    migraines   High cholesterol    Hypertension    Infertility, female    Lactose intolerance    Oligohydramnios antepartum 11/09/2017   Palpitations    Panic attack 2017   diagnosed 3 months ago. no meds presently   Reflux    did not filll prescription   S/P percutaneous patent foramen ovale closure 06/16/2020   s/p PFO occluder device with a a 25 MM AMPLATZER by Dr. Burt Knack   Stroke [facility])    Swelling    Vitamin D deficiency    Past Surgical History:  Procedure Laterality Date   BREAST EXCISIONAL BIOPSY Right    cyst   BUBBLE STUDY  04/16/2020   Procedure: BUBBLE STUDY;  Surgeon: Skeet Latch, MD;  Location: [place];  Service: Cardiovascular;;   DILATION AND EVACUATION N/A 07/26/2015   Procedure: DILATATION AND EVACUATION;  Surgeon: Shelly Bombard, MD;  Location: [facility];  Service: Gynecology;  Laterality: N/A;   PATENT FORAMEN OVALE(PFO) CLOSURE N/A 06/16/2020   Procedure: PATENT FORAMEN OVALE (PFO) CLOSURE;  Surgeon: Sherren Mocha, MD;  Location: [place] [facility];  Service: Cardiovascular;  Laterality: N/A;   TEE WITHOUT CARDIOVERSION N/A 04/16/2020   Procedure: TRANSESOPHAGEAL ECHOCARDIOGRAM (TEE);  Surgeon: Skeet Latch, MD;   Location: [place];  Service: Cardiovascular;  Laterality: N/A;    Allergies  No Known Allergies  History of Present Illness    Tracey [name] is a 47 y.o. female with a hx of hypertension, CVA, prior tobacco use, PFO s/p PFO closure 05/20/2020 last seen 06/15/2021.  Diagnosed with hypertension at the birth of her youngest child in 2020 and started on lisinopril.  Admitted 03/2020 with acute stroke found to have small acute infarct in right posterior frontal lobe thought to be embolic.  Lower extremity Doppler negative for DVT.  Monitor with no atrial fibrillation.  On 06/16/2020 underwent 25 mm Amplatzer occluder device of PFO.  Postprocedural echo with no residual shunting.  Last seen 06/15/2021 doing well from a cardiac perspective and no changes made at that time.  Echo done same day showed no significant shunt.  She presents today for follow-up. **  EKGs/Labs/Other Studies Reviewed:   The following studies were reviewed today:   Cardiac Studies & Procedures   CARDIAC CATHETERIZATION  CARDIAC CATHETERIZATION 06/16/2020  Narrative SUCCESSFUL TRANSCATHETER PFO CLOSURE USING INTRACARDIAC ECHO AND FLUOROSCOPIC GUIDANCE. DEFECT CLOSED WITH A 25 MM AMPLATZER PFO OCCLUDER DEVICE  RECOMMEND:  ASA/CLOPIDOGREL X 6 MONTHS  SBE PROPHYLAXIS X 6 MONTHS  LIMITED 2D ECHO POST-PROCEDURE ASSESSMENT     ECHOCARDIOGRAM  ECHOCARDIOGRAM LIMITED BUBBLE STUDY 06/16/2021  Narrative ECHOCARDIOGRAM LIMITED REPORT    Patient Name:   Tracey [name]  Date of Exam: 06/15/2021 Medical Rec #:  [id number]     Height:       67.0 in Accession #:    [id number]    Weight:       206.0 lb Date of Birth:  Jan 16, 1976    BSA:          2.048 m Patient Age:    41 years      BP:           139/94 mmHg Patient Gender: F             HR:           65 bpm. Exam Location:  [address]  Procedure: Limited Echo, Cardiac Doppler, Limited Color Doppler and Strain Analysis  MODIFIED REPORT: This report was modified  by Berniece Salines DO on 06/16/2021 due to reviewed for shunt, changed no shunt seen. Indications:     Z87.74 PFO  History:         Patient has prior history of Echocardiogram examinations, most recent 06/16/2020. Stroke; Risk Factors:Hypertension.  Sonographer:     Marygrace Drought RCS Referring Phys:  [id number] KATHRYN R THOMPSON Diagnosing Phys: Berniece Salines DO  IMPRESSIONS   1. Left ventricular ejection fraction, by estimation, is 55 to 60%. The left ventricle has normal function. There is mild asymmetric left ventricular hypertrophy of the inferior segment. Left ventricular diastolic function could not be evaluated. 2. The mitral valve is normal in structure. Trivial mitral valve regurgitation. No evidence of mitral stenosis. 3. The aortic valve is tricuspid. Aortic valve regurgitation is not visualized. No aortic stenosis is present. 4. Agitated saline contrast bubble study was negative, with no evidence of any interatrial shunt.  FINDINGS Left Ventricle: Left ventricular ejection fraction, by estimation, is 55 to 60%. The left ventricle has normal function. There is mild asymmetric left ventricular hypertrophy of the inferior segment. Left ventricular diastolic function could not be evaluated.  Pericardium: Presence of epicardial fat layer.  Mitral Valve: The mitral valve is normal in structure. Trivial mitral valve regurgitation. No evidence of mitral valve stenosis.  Aortic Valve: The aortic valve is tricuspid. Aortic valve regurgitation is not visualized. No aortic stenosis is present.  Pulmonic Valve: The pulmonic valve was normal in structure. Pulmonic valve regurgitation is not visualized. No evidence of pulmonic stenosis.  IAS/Shunts: No atrial level shunt detected by color flow Doppler. Agitated saline contrast bubble study was negative, with no evidence of any interatrial shunt.  LEFT VENTRICLE PLAX 2D LVIDd:         4.60 cm LVIDs:         3.30 cm LV PW:         1.20 cm LV  IVS:        0.80 cm LVOT diam:     1.80 cm LVOT Area:     2.54 cm   LEFT ATRIUM         Index LA diam:    3.60 cm 1.76 cm/m  AORTA Ao Root diam: 2.80 cm  MITRAL VALVE MV Area (PHT): 3.89 cm    SHUNTS MV Decel Time: 195 msec    Systemic Diam: 1.80 cm MV E velocity: 82.80 cm/s MV A velocity: 73.90 cm/s MV E/A ratio:  1.12  Kardie Tobb DO Electronically signed by Berniece Salines DO Signature Date/Time: 06/15/2021/3:42:41 PM    Final (Updated)   TEE  ECHO TEE 04/16/2020  Narrative TRANSESOPHOGEAL ECHO REPORT    Patient Name:   Tracey [name] Date of Exam: 04/16/2020 Medical Rec #:  [id number]    Height:       67.0 in Accession #:    [id number]   Weight:       215.0 lb Date of Birth:  [birth year]   BSA:          2.085 m Patient Age:    72 years     BP:           140/93 mmHg Patient Gender: F            HR:           89 bpm. Exam Location:  Outpatient  Procedure: Transesophageal Echo, Cardiac Doppler, Color Doppler and Saline Contrast Bubble Study  Indications:     CVA  History:         Patient has prior history of Echocardiogram examinations, most recent 04/03/2020. Stroke; Risk Factors:Hypertension and Former Smoker. PFO.  Sonographer:     Dustin Flock Referring Phys:  [id number] [place] Diagnosing Phys: Skeet Latch MD  PROCEDURE: After discussion of the risks and benefits of a TEE, an informed consent was obtained from the patient. The transesophogeal probe was passed without difficulty through the esophogus of the patient. Sedation performed by different physician. The patient was monitored while under deep sedation. Anesthestetic sedation was provided intravenously by Anesthesiology: 320.'63mg'$  of Propofol. Image quality was excellent. The patient's vital signs; including heart rate, blood pressure, and oxygen saturation; remained stable throughout the procedure. The patient developed no complications during the procedure.  IMPRESSIONS   1. Left  ventricular ejection fraction, by estimation, is 60 to 65%. The left ventricle has normal function. The left ventricle has no regional wall motion abnormalities. 2. Right ventricular systolic function is normal. The right ventricular size is normal. 3. No left atrial/left atrial appendage thrombus was detected. The LAA emptying velocity was 71 cm/s. 4. The mitral valve is normal in structure. Trivial mitral valve regurgitation. No evidence of mitral stenosis. 5. The aortic valve is tricuspid. Aortic valve regurgitation is not visualized. No aortic stenosis is present. 6. Evidence of atrial level shunting detected by color flow Doppler. Agitated saline contrast bubble study was positive with shunting observed within 3-6 cardiac cycles suggestive of interatrial shunt. There is a small patent foramen ovale with predominantly right to left shunting across the atrial septum.  Conclusion(s)/Recommendation(s): Normal biventricular function without evidence of hemodynamically significant valvular heart disease.  FINDINGS Left Ventricle: Left ventricular ejection fraction, by estimation, is 60 to 65%. The left ventricle has normal function. The left ventricle has no regional wall motion abnormalities. The left ventricular internal cavity size was normal in size. There is no left ventricular hypertrophy.  Right Ventricle: The right ventricular size is normal. No increase in right ventricular wall thickness. Right ventricular systolic function is normal.  Left Atrium: Left atrial size was normal in size. No left atrial/left atrial appendage thrombus was detected. The LAA emptying velocity was 71 cm/s.  Right Atrium: Right atrial size was normal in size.  Pericardium: There is no evidence of pericardial effusion.  Mitral Valve: The mitral valve is normal in structure. Trivial mitral valve regurgitation. No evidence of mitral valve stenosis.  Tricuspid Valve: The tricuspid valve is normal in structure.  Tricuspid valve regurgitation is trivial. No evidence of tricuspid stenosis.  Aortic Valve: The aortic valve is tricuspid. Aortic valve regurgitation is not visualized. No aortic stenosis is  present.  Pulmonic Valve: The pulmonic valve was normal in structure. Pulmonic valve regurgitation is trivial. No evidence of pulmonic stenosis.  Aorta: The aortic root is normal in size and structure.  IAS/Shunts: Evidence of atrial level shunting detected by color flow Doppler. Agitated saline contrast was given intravenously to evaluate for intracardiac shunting. Agitated saline contrast bubble study was positive with shunting observed within 3-6 cardiac cycles suggestive of interatrial shunt. A small patent foramen ovale is detected with predominantly right to left shunting across the atrial septum.  Skeet Latch MD Electronically signed by Skeet Latch MD Signature Date/Time: 04/16/2020/2:20:28 PM    Final   MONITORS  CARDIAC EVENT MONITOR 05/20/2020  Narrative  Patient's monitoring period was from 04/16/20-05/15/20  Predominant rhythm was NSR with average HR 69bpm ranging from 45bpm to 127bpm.  There were rare SVEs (<1%), occasional PVCs (1%, 16977)  No afib, significant pauses or VT  Overall, no significant arrhythmias detected on the monitor  Gwyndolyn Kaufman, MD            EKG:  EKG is  ordered today.  The ekg ordered today demonstrates ***  Recent Labs: No results found for requested labs within last 365 days.  Recent Lipid Panel    Component Value Date/Time   CHOL 102 07/21/2020 1052   TRIG 40 07/21/2020 1052   HDL 40 07/21/2020 1052   CHOLHDL 4.0 04/03/2020 0343   VLDL 11 04/03/2020 0343   LDLCALC 51 07/21/2020 1052   Home Medications   No outpatient medications have been marked as taking for the 05/19/22 encounter (Appointment) with Loel Dubonnet, NP.     Review of Systems      All other systems reviewed and are otherwise negative except as noted  above.  Physical Exam    VS:  There were no vitals taken for this visit. , BMI There is no height or weight on file to calculate BMI.  Wt Readings from Last 3 Encounters:  03/23/22 216 lb (98 kg)  12/05/21 213 lb (96.6 kg)  08/16/21 212 lb (96.2 kg)     GEN: Well nourished, well developed, in no acute distress. HEENT: normal. Neck: Supple, no JVD, carotid bruits, or masses. Cardiac: ***RRR, no murmurs, rubs, or gallops. No clubbing, cyanosis, edema.  ***Radials/PT 2+ and equal bilaterally.  Respiratory:  ***Respirations regular and unlabored, clear to auscultation bilaterally. GI: Soft, nontender, nondistended. MS: No deformity or atrophy. Skin: Warm and dry, no rash. Neuro:  Strength and sensation are intact. Psych: Normal affect.  Assessment & Plan    HTN -   PFO s/p PFO closure 05/2020 -   Hx of CVA - Continue ASA, statin  Anxiety - Continue to follow with PCP.   No BP recorded.  {Refresh Note OR Click here to enter BP  :1}***      Disposition: Follow up {follow up:15908} with Skeet Latch, MD or APP.  Signed, Loel Dubonnet, NP 05/19/2022, 1:27 PM [facility]

## 2022-05-29 DIAGNOSIS — Z1231 Encounter for screening mammogram for malignant neoplasm of breast: Secondary | ICD-10-CM

## 2022-05-30 ENCOUNTER — Telehealth: Payer: BLUE CROSS/BLUE SHIELD

## 2022-06-12 ENCOUNTER — Ambulatory Visit (HOSPITAL_BASED_OUTPATIENT_CLINIC_OR_DEPARTMENT_OTHER): Payer: BLUE CROSS/BLUE SHIELD | Admitting: Family

## 2022-06-12 ENCOUNTER — Encounter (HOSPITAL_BASED_OUTPATIENT_CLINIC_OR_DEPARTMENT_OTHER): Payer: Self-pay | Admitting: Family

## 2022-06-12 VITALS — BP 124/78 | HR 89 | Ht 67.0 in | Wt 214.0 lb

## 2022-06-12 DIAGNOSIS — Z8673 Personal history of transient ischemic attack (TIA), and cerebral infarction without residual deficits: Secondary | ICD-10-CM | POA: Diagnosis not present

## 2022-06-12 DIAGNOSIS — Z8774 Personal history of (corrected) congenital malformations of heart and circulatory system: Secondary | ICD-10-CM | POA: Diagnosis not present

## 2022-06-12 DIAGNOSIS — E7849 Other hyperlipidemia: Secondary | ICD-10-CM | POA: Diagnosis not present

## 2022-06-12 DIAGNOSIS — R7401 Elevation of levels of liver transaminase levels: Secondary | ICD-10-CM

## 2022-06-12 DIAGNOSIS — I10 Essential (primary) hypertension: Secondary | ICD-10-CM

## 2022-06-12 MED ORDER — ROSUVASTATIN CALCIUM 10 MG PO TABS: 10.0000 mg | ORAL_TABLET | Freq: Every day | ORAL | 3 refills | Status: DC

## 2022-06-12 NOTE — Patient Instructions (Addendum)
Medication Instructions:  Your physician has recommended you make the following change in your medication:   May trial Melatonin 30 minutes prior to sleep for insomnia. Try 5mg  tablet and if that does not work, take 10mg  instead.   Continue Lisinopril and Rosuvatatin  *If you need a refill on your cardiac medications before your next appointment, please call your pharmacy*   Lab Work: Your physician recommends that you return for lab work today: liver enzymes  If you have labs (blood work) drawn today and your tests are completely normal, you will receive your results only by: [place] (if you have MyChart) OR A paper copy in the mail If you have any lab test that is abnormal or we need to change your treatment, we will call you to review the results.   Testing/Procedures: Your EKG today showed normal sinus rhythm which is great!  Follow-Up: At [facility], you and your health needs are our priority.  As part of our continuing mission to provide you with exceptional heart care, we have created designated Provider Care Teams.  These Care Teams include your primary Cardiologist (physician) and Advanced Practice Providers (APPs -  Physician Assistants and Nurse Practitioners) who all work together to provide you with the care you need, when you need it.  We recommend signing up for the patient portal called "MyChart".  Sign up information is provided on this After Visit Summary.  MyChart is used to connect with patients for Virtual Visits (Telemedicine).  Patients are able to view lab/test results, encounter notes, upcoming appointments, etc.  Non-urgent messages can be sent to your provider as well.   To learn more about what you can do with MyChart, go to [email or web address].    Your next appointment:   1 year(s)  Provider:   Skeet Latch, MD or Laurann Montana, NP    Other Instructions  Heart Healthy Diet Recommendations: A low-salt diet is recommended.  Meats should be grilled, baked, or boiled. Avoid fried foods. Focus on lean protein sources like fish or chicken with vegetables and fruits. The American Heart Association is a Microbiologist!  American Heart Association Diet and Lifeystyle Recommendations   Exercise recommendations: The American Heart Association recommends 150 minutes of moderate intensity exercise weekly. Try 30 minutes of moderate intensity exercise 4-5 times per week. This could include walking, jogging, or swimming.  Tips to Measure your Blood Pressure Correctly  Let us know if your blood pressure is consistently more than 130/80.   Here's what you can do to ensure a correct reading:  Don't drink a caffeinated beverage or smoke during the 30 minutes before the test.  Sit quietly for five minutes before the test begins.  During the measurement, sit in a chair with your feet on the floor and your arm supported so your elbow is at about heart level.  The inflatable part of the cuff should completely cover at least 80% of your upper arm, and the cuff should be placed on bare skin, not over a shirt.  Don't talk during the measurement.  Have your blood pressure measured twice, with a brief break in between. If the readings are different by 5 points or more, have it done a third time.  Blood pressure categories  Blood pressure category SYSTOLIC (upper number)  DIASTOLIC (lower number)  Normal Less than 120 mm Hg and Less than 80 mm Hg  Elevated 120-129 mm Hg and Less than 80 mm Hg  High blood pressure: Stage 1  hypertension 130-139 mm Hg or 80-89 mm Hg  High blood pressure: Stage 2 hypertension 140 mm Hg or higher or 90 mm Hg or higher  Hypertensive crisis (consult your doctor immediately) Higher than 180 mm Hg and/or Higher than 120 mm Hg  Source: American Heart Association and American Stroke Association. For more on getting your blood pressure under control, buy Controlling Your Blood Pressure, a Special Health Report  from [facility].  Blood Pressure Log   Date   Time  Blood Pressure  Example: Nov 1 9 AM 124/78

## 2022-06-12 NOTE — Progress Notes (Signed)
Office Visit    Patient Name: Tracey [name] Date of Encounter: 06/12/2022  PCP:  Hoyt Koch, MD   [place]  Cardiologist:  Skeet Latch, MD  Advanced Practice Provider:  No care team member to display Electrophysiologist:  None      Chief Complaint    Tracey [name] is a 47 y.o. female presents today for annual follow up of hypertension   Past Medical History    Past Medical History:  Diagnosis Date   Anxiety    Bronchitis    Carpal tunnel syndrome on both sides    Constipation    Depression    Family history of adverse reaction to anesthesia    mother had problems waking up from surgery.   Gestational diabetes    Gestational diabetes mellitus    Normal 2 hr GTT postpartum   Headache    migraines   High cholesterol    Hypertension    Infertility, female    Lactose intolerance    Oligohydramnios antepartum 11/09/2017   Palpitations    Panic attack 2017   diagnosed 3 months ago. no meds presently   Reflux    did not filll prescription   S/P percutaneous patent foramen ovale closure 06/16/2020   s/p PFO occluder device with a a 25 MM AMPLATZER by Dr. Burt Knack   Stroke [facility])    Swelling    Vitamin D deficiency    Past Surgical History:  Procedure Laterality Date   BREAST EXCISIONAL BIOPSY Right    cyst   BUBBLE STUDY  04/16/2020   Procedure: BUBBLE STUDY;  Surgeon: Skeet Latch, MD;  Location: [facility];  Service: Cardiovascular;;   DILATION AND EVACUATION N/A 07/26/2015   Procedure: DILATATION AND EVACUATION;  Surgeon: Shelly Bombard, MD;  Location: [place] [facility];  Service: Gynecology;  Laterality: N/A;   PATENT FORAMEN OVALE(PFO) CLOSURE N/A 06/16/2020   Procedure: PATENT FORAMEN OVALE (PFO) CLOSURE;  Surgeon: Sherren Mocha, MD;  Location: [place] [facility];  Service: Cardiovascular;  Laterality: N/A;   TEE WITHOUT CARDIOVERSION N/A 04/16/2020   Procedure: TRANSESOPHAGEAL ECHOCARDIOGRAM (TEE);  Surgeon:  Skeet Latch, MD;  Location[facility];  Service: Cardiovascular;  Laterality: N/A;   Allergies  No Known Allergies  History of Present Illness    Tracey [name] is a 47 y.o. female with a hx of hypertension, stroke, prior tobacco use, PFO s/p PFO closure 05/19/2020 last seen 06/15/21 by Kathyrn Drown, NP.  Diagnosed with hypertension at the birth of her youngest child in 2020 and started on lisinopril.  Presented to the hospital 03/2020 with acute stroke with neck discomfort and left-sided numbness and weakness.  Found to have small acute infarct in right posterior frontal lobe which is thought to be embolic.  LE Dopplers negative for DVT.  Monitor with no atrial fibrillation.  She followed with Dr. Burt Knack and had 25 mm Amplatzer occluder device implanted 06/16/2020.  Postprocedural echo stable with no residual shunting.  Last seen 06/15/2021.  Reported recent stressors that had recently adopted a 24-month-old child.  Her blood pressure was well-controlled on lisinopril 20 mg daily.  Echo 06/15/2021 LVEF 55-60%, no evidence of shunting.  She presents today for follow-up.  Has established with Lyn Henri MD for weight loss and is taking semaglutide compound. Tells me she self discontinued her wellbutrin and zoloft as she had elevated liver enzymes early in February and was concerned that this may affect them.  Of note at that  same time she was recovering from [place] and had taken antiviral but not stopped her rosuvastatin.  Does endorse frequently eating out and is working to try and eat more at home.  No formal exercise routine but does stay active with her children.  Labs 04/2022 with outside provider: Creat 1.1, Asl 48, Alt 123 Total cholesterol 136 tri 61 hdl 47 ldl 77 Tsh 1.576 Hb 13  EKGs/Labs/Other Studies Reviewed:   The following studies were reviewed today:  Cardiac Studies & Procedures   CARDIAC CATHETERIZATION  CARDIAC CATHETERIZATION 06/16/2020  Narrative SUCCESSFUL  TRANSCATHETER PFO CLOSURE USING INTRACARDIAC ECHO AND FLUOROSCOPIC GUIDANCE. DEFECT CLOSED WITH A 25 MM AMPLATZER PFO OCCLUDER DEVICE  RECOMMEND:  ASA/CLOPIDOGREL X 6 MONTHS  SBE PROPHYLAXIS X 6 MONTHS  LIMITED 2D ECHO POST-PROCEDURE ASSESSMENT     ECHOCARDIOGRAM  ECHOCARDIOGRAM LIMITED BUBBLE STUDY 06/16/2021  Narrative ECHOCARDIOGRAM LIMITED REPORT    Patient Name:   Tracey [name]  Date of Exam: 06/15/2021 Medical Rec #:  [id number]     Height:       67.0 in Accession #:    [id number]    Weight:       206.0 lb Date of Birth:  05-31-1975    BSA:          2.048 m Patient Age:    45 years      BP:           139/94 mmHg Patient Gender: F             HR:           65 bpm. Exam Location:  [address]  Procedure: Limited Echo, Cardiac Doppler, Limited Color Doppler and Strain Analysis  MODIFIED REPORT: This report was modified by Berniece Salines DO on 06/16/2021 due to reviewed for shunt, changed no shunt seen. Indications:     Z87.74 PFO  History:         Patient has prior history of Echocardiogram examinations, most recent 06/16/2020. Stroke; Risk Factors:Hypertension.  Sonographer:     Marygrace Drought RCS Referring Phys:  [id number] KATHRYN R THOMPSON Diagnosing Phys: Berniece Salines DO  IMPRESSIONS   1. Left ventricular ejection fraction, by estimation, is 55 to 60%. The left ventricle has normal function. There is mild asymmetric left ventricular hypertrophy of the inferior segment. Left ventricular diastolic function could not be evaluated. 2. The mitral valve is normal in structure. Trivial mitral valve regurgitation. No evidence of mitral stenosis. 3. The aortic valve is tricuspid. Aortic valve regurgitation is not visualized. No aortic stenosis is present. 4. Agitated saline contrast bubble study was negative, with no evidence of any interatrial shunt.  FINDINGS Left Ventricle: Left ventricular ejection fraction, by estimation, is 55 to 60%. The left ventricle has normal  function. There is mild asymmetric left ventricular hypertrophy of the inferior segment. Left ventricular diastolic function could not be evaluated.  Pericardium: Presence of epicardial fat layer.  Mitral Valve: The mitral valve is normal in structure. Trivial mitral valve regurgitation. No evidence of mitral valve stenosis.  Aortic Valve: The aortic valve is tricuspid. Aortic valve regurgitation is not visualized. No aortic stenosis is present.  Pulmonic Valve: The pulmonic valve was normal in structure. Pulmonic valve regurgitation is not visualized. No evidence of pulmonic stenosis.  IAS/Shunts: No atrial level shunt detected by color flow Doppler. Agitated saline contrast bubble study was negative, with no evidence of any interatrial shunt.  LEFT VENTRICLE PLAX 2D LVIDd:  4.60 cm LVIDs:         3.30 cm LV PW:         1.20 cm LV IVS:        0.80 cm LVOT diam:     1.80 cm LVOT Area:     2.54 cm   LEFT ATRIUM         Index LA diam:    3.60 cm 1.76 cm/m  AORTA Ao Root diam: 2.80 cm  MITRAL VALVE MV Area (PHT): 3.89 cm    SHUNTS MV Decel Time: 195 msec    Systemic Diam: 1.80 cm MV E velocity: 82.80 cm/s MV A velocity: 73.90 cm/s MV E/A ratio:  1.12  Kardie Tobb DO Electronically signed by Berniece Salines DO Signature Date/Time: 06/15/2021/3:42:41 PM    Final (Updated)   TEE  ECHO TEE 04/16/2020  Narrative TRANSESOPHOGEAL ECHO REPORT    Patient Name:   Cyndee Leanos Date of Exam: 04/16/2020 Medical Rec #:  [id number]    Height:       67.0 in Accession #:    [id number]   Weight:       215.0 lb Date of Birth:  1975/05/18   BSA:          2.085 m Patient Age:    17 years     BP:           140/93 mmHg Patient Gender: F            HR:           89 bpm. Exam Location:  Outpatient  Procedure: Transesophageal Echo, Cardiac Doppler, Color Doppler and Saline Contrast Bubble Study  Indications:     CVA  History:         Patient has prior history of Echocardiogram  examinations, most recent 04/03/2020. Stroke; Risk Factors:Hypertension and Former Smoker. PFO.  Sonographer:     Dustin Flock Referring Phys:  [id number] [place] Diagnosing Phys: Skeet Latch MD  PROCEDURE: After discussion of the risks and benefits of a TEE, an informed consent was obtained from the patient. The transesophogeal probe was passed without difficulty through the esophogus of the patient. Sedation performed by different physician. The patient was monitored while under deep sedation. Anesthestetic sedation was provided intravenously by Anesthesiology: 320.63mg  of Propofol. Image quality was excellent. The patient's vital signs; including heart rate, blood pressure, and oxygen saturation; remained stable throughout the procedure. The patient developed no complications during the procedure.  IMPRESSIONS   1. Left ventricular ejection fraction, by estimation, is 60 to 65%. The left ventricle has normal function. The left ventricle has no regional wall motion abnormalities. 2. Right ventricular systolic function is normal. The right ventricular size is normal. 3. No left atrial/left atrial appendage thrombus was detected. The LAA emptying velocity was 71 cm/s. 4. The mitral valve is normal in structure. Trivial mitral valve regurgitation. No evidence of mitral stenosis. 5. The aortic valve is tricuspid. Aortic valve regurgitation is not visualized. No aortic stenosis is present. 6. Evidence of atrial level shunting detected by color flow Doppler. Agitated saline contrast bubble study was positive with shunting observed within 3-6 cardiac cycles suggestive of interatrial shunt. There is a small patent foramen ovale with predominantly right to left shunting across the atrial septum.  Conclusion(s)/Recommendation(s): Normal biventricular function without evidence of hemodynamically significant valvular heart disease.  FINDINGS Left Ventricle: Left ventricular ejection  fraction, by estimation, is 60 to 65%. The left ventricle has normal function. The left  ventricle has no regional wall motion abnormalities. The left ventricular internal cavity size was normal in size. There is no left ventricular hypertrophy.  Right Ventricle: The right ventricular size is normal. No increase in right ventricular wall thickness. Right ventricular systolic function is normal.  Left Atrium: Left atrial size was normal in size. No left atrial/left atrial appendage thrombus was detected. The LAA emptying velocity was 71 cm/s.  Right Atrium: Right atrial size was normal in size.  Pericardium: There is no evidence of pericardial effusion.  Mitral Valve: The mitral valve is normal in structure. Trivial mitral valve regurgitation. No evidence of mitral valve stenosis.  Tricuspid Valve: The tricuspid valve is normal in structure. Tricuspid valve regurgitation is trivial. No evidence of tricuspid stenosis.  Aortic Valve: The aortic valve is tricuspid. Aortic valve regurgitation is not visualized. No aortic stenosis is present.  Pulmonic Valve: The pulmonic valve was normal in structure. Pulmonic valve regurgitation is trivial. No evidence of pulmonic stenosis.  Aorta: The aortic root is normal in size and structure.  IAS/Shunts: Evidence of atrial level shunting detected by color flow Doppler. Agitated saline contrast was given intravenously to evaluate for intracardiac shunting. Agitated saline contrast bubble study was positive with shunting observed within 3-6 cardiac cycles suggestive of interatrial shunt. A small patent foramen ovale is detected with predominantly right to left shunting across the atrial septum.  Skeet Latch MD Electronically signed by Skeet Latch MD Signature Date/Time: 04/16/2020/2:20:28 PM    Final   MONITORS  CARDIAC EVENT MONITOR 05/20/2020  Narrative  Patient's monitoring period was from 04/16/20-05/15/20  Predominant rhythm was NSR  with average HR 69bpm ranging from 45bpm to 127bpm.  There were rare SVEs (<1%), occasional PVCs (1%, 16977)  No afib, significant pauses or VT  Overall, no significant arrhythmias detected on the monitor  Gwyndolyn Kaufman, MD            EKG:  EKG is ordered today.  The ekg ordered today demonstrates NSR 89 bpm with no acute ST/T wave changes.   Recent Labs: No results found for requested labs within last 365 days.  Recent Lipid Panel    Component Value Date/Time   CHOL 102 07/21/2020 1052   TRIG 40 07/21/2020 1052   HDL 40 07/21/2020 1052   CHOLHDL 4.0 04/03/2020 0343   VLDL 11 04/03/2020 0343   LDLCALC 51 07/21/2020 1052    Home Medications   Current Meds  Medication Sig   aspirin 81 MG EC tablet Take 81 mg by mouth daily. Swallow whole.   Fluocinolone Acetonide Scalp (DERMA-SMOOTHE/FS SCALP) 0.01 % OIL Apply to affected scalp 3-4 times per week. Do not apply to face.   levonorgestrel (MIRENA, 52 MG,) 20 MCG/24HR IUD 1 Intra Uterine Device (1 each total) by Intrauterine route once for 1 dose.   lisinopril (ZESTRIL) 20 MG tablet Take 1.5 tablets (30 mg total) by mouth daily.   Multiple Vitamins-Minerals (MULTIVITAMIN WITH MINERALS) tablet Take 1 tablet by mouth daily.   Probiotic Product (PROBIOTIC DAILY PO) Take 1 tablet by mouth daily.    rosuvastatin (CRESTOR) 10 MG tablet Take 1 tablet (10 mg total) by mouth daily.   Semaglutide-Weight Management (WEGOVY) 0.25 MG/0.5ML SOAJ 0.25 mg Subcutaneous once weekly 30 days   Vitamin D, Ergocalciferol, (DRISDOL) 1.25 MG (50000 UNIT) CAPS capsule Take 1 capsule (50,000 Units total) by mouth every 7 (seven) days.     Review of Systems      All other systems reviewed and are otherwise  negative except as noted above.  Physical Exam    VS:  BP 124/78   Pulse 89   Ht 5\' 7"  (1.702 m)   Wt 214 lb (97.1 kg)   BMI 33.52 kg/m  , BMI Body mass index is 33.52 kg/m.  Wt Readings from Last 3 Encounters:  06/12/22 214 lb (97.1  kg)  03/23/22 216 lb (98 kg)  12/05/21 213 lb (96.6 kg)     GEN: Well nourished, well developed, in no acute distress. HEENT: normal. Neck: Supple, no JVD, carotid bruits, or masses. Cardiac: RRR, no murmurs, rubs, or gallops. No clubbing, cyanosis, edema.  Radials/PT 2+ and equal bilaterally.  Respiratory:  Respirations regular and unlabored, clear to auscultation bilaterally. GI: Soft, nontender, nondistended. MS: No deformity or atrophy. Skin: Warm and dry, no rash. Neuro:  Strength and sensation are intact. Psych: Normal affect.  Assessment & Plan    HTN- BP well controlled. Continue current antihypertensive regimen Lisinopril 30mg  QD.  PFO s/p PFO closure-performed 05/2020 by Dr. Burt Knack.  Echo 05/2021 with normal LVEF and no shunting.  HLD - Continue Rosuvastatin. 04/2022 LDL 77. If LFTS persistently elevated may have to temporarily discontinue.  Transaminitis - 04/2022 labs with weight loss clinic revealed AST 48, ALT 123. Anticipate related to eating out, tylenol use, and utilization of molnupiravir while on statin. Update LFT today. If not improved consider referral to GI vs follow up with PCP.  Cryptogenic CVA-continue aspirin and statin.  Depression - Previously on Zoloft, Wellbutrin which she self discontinued as she was concerned about her liver enzymes.  Discussed that I have low suspicion these were the etiology of her transaminitis.  Updating LFTs, as above.  Encouraged her to follow-up with PCP as if she notices increasing symptoms of anxiety or depression would be worthwhile to resume these medications but would require provider oversight.        Disposition: Follow up in 1 year(s) with Skeet Latch, MD or APP.  Signed, Loel Dubonnet, NP 06/12/2022, 2:48 PM [place]

## 2022-06-13 LAB — HEPATIC FUNCTION PANEL
ALT: 24 IU/L (ref 0–32)
AST: 18 IU/L (ref 0–40)
Albumin: 4.4 g/dL (ref 3.9–4.9)
Alkaline Phosphatase: 44 IU/L (ref 44–121)
Bilirubin Total: 0.4 mg/dL (ref 0.0–1.2)
Bilirubin, Direct: 0.16 mg/dL (ref 0.00–0.40)
Total Protein: 6.8 g/dL (ref 6.0–8.5)

## 2022-06-16 ENCOUNTER — Other Ambulatory Visit (HOSPITAL_BASED_OUTPATIENT_CLINIC_OR_DEPARTMENT_OTHER): Payer: Self-pay

## 2022-06-16 ENCOUNTER — Telehealth: Payer: BLUE CROSS/BLUE SHIELD | Admitting: Physician Assistant

## 2022-06-16 DIAGNOSIS — J029 Acute pharyngitis, unspecified: Secondary | ICD-10-CM

## 2022-06-16 DIAGNOSIS — R0982 Postnasal drip: Secondary | ICD-10-CM | POA: Diagnosis not present

## 2022-06-16 MED ORDER — FLUTICASONE PROPIONATE 50 MCG/ACT NA SUSP
2.0000 | Freq: Every day | NASAL | 0 refills | Status: DC
  Filled 2022-06-16: qty 16, 30d supply, fill #0

## 2022-06-16 NOTE — Progress Notes (Signed)
E visit for Allergic Rhinitis We are sorry that you are not feeling well.  Here is how we plan to help!  Based on what you have shared with me it looks like you have Allergic Rhinitis.  Rhinitis is when a reaction occurs that causes nasal congestion, runny nose, sneezing, and itching.  Most types of rhinitis are caused by an inflammation and are associated with symptoms in the eyes ears or throat. There are several types of rhinitis.  The most common are acute rhinitis, which is usually caused by a viral illness, allergic or seasonal rhinitis, and nonallergic or year-round rhinitis.  Nasal allergies occur certain times of the year.  Allergic rhinitis is caused when allergens in the air trigger the release of histamine in the body.  Histamine causes itching, swelling, and fluid to build up in the fragile linings of the nasal passages, sinuses and eyelids.  An itchy nose and clear discharge are common.  For throat pain, we recommend over the counter oral pain relief medications such as acetaminophen or aspirin, or anti-inflammatory medications such as ibuprofen or naproxen sodium.  Topical treatments such as oral throat lozenges or sprays may be used as needed.   I would recommend a nasal spray: Flonase 2 sprays into each nostril once daily has been prescribed to your pharmacy.  HOME CARE:  You can use an over-the-counter saline nasal spray as needed Avoid areas where there is heavy dust, mites, or molds Stay indoors on windy days during the pollen season Keep windows closed in home, at least in bedroom; use air conditioner. Use high-efficiency house air filter Keep windows closed in car, turn AC on re-circulate Avoid playing out with dog during pollen season  GET HELP RIGHT AWAY IF:  If your symptoms do not improve within 10 days You become short of breath You develop yellow or green discharge from your nose for over 3 days You have coughing fits  MAKE SURE YOU:  Understand these  instructions Will watch your condition Will get help right away if you are not doing well or get worse  Thank you for choosing an e-visit. Your e-visit answers were reviewed by a board certified advanced clinical practitioner to complete your personal care plan. Depending upon the condition, your plan could have included both over the counter or prescription medications. Please review your pharmacy choice. Be sure that the pharmacy you have chosen is open so that you can pick up your prescription now.  If there is a problem you may message your provider in [place] to have the prescription routed to another pharmacy. Your safety is important to [place]. If you have drug allergies check your prescription carefully.  For the next 24 hours, you can use MyChart to ask questions about today's visit, request a non-urgent call back, or ask for a work or school excuse from your e-visit provider. You will get an email in the next two days asking about your experience. I hope that your e-visit has been valuable and will speed your recovery.  I have spent 5 minutes in review of e-visit questionnaire, review and updating patient chart, medical decision making and response to patient.   Mar Daring, PA-C

## 2022-06-24 ENCOUNTER — Encounter (HOSPITAL_BASED_OUTPATIENT_CLINIC_OR_DEPARTMENT_OTHER): Payer: Self-pay

## 2022-06-26 ENCOUNTER — Encounter: Payer: BLUE CROSS/BLUE SHIELD | Admitting: Obstetrics and Gynecology

## 2022-06-26 NOTE — Telephone Encounter (Signed)
Clinical note only: At last OV patient had self discontinued her Sertraline and Bupropion due to elevated liver enzymes at outside provider. Subsequent liver enzymes have normalized.   Tracey Sorrow, NP

## 2022-06-26 NOTE — Telephone Encounter (Signed)
Please advise 

## 2022-06-27 ENCOUNTER — Telehealth: Payer: Self-pay | Admitting: Internal Medicine

## 2022-06-27 ENCOUNTER — Other Ambulatory Visit (HOSPITAL_BASED_OUTPATIENT_CLINIC_OR_DEPARTMENT_OTHER): Payer: Self-pay

## 2022-06-27 NOTE — Telephone Encounter (Signed)
PT is interested in transferring care from Dr.Jones to Dr.Crawford. We are needing an approval from both providers before going through with this transfer of care request. I will inform PT of the decision once hearing back from both providers.

## 2022-06-27 NOTE — Telephone Encounter (Signed)
Recommend patient re-establish care with PCP (last seen 2022) and then if patient still wants to transfer to request to avoid delay to care since next TOC likely fall if patient wanting to see me.

## 2022-07-04 ENCOUNTER — Ambulatory Visit: Payer: BLUE CROSS/BLUE SHIELD | Admitting: Internal Medicine

## 2022-07-04 VITALS — BP 126/78 | HR 74 | Temp 97.7°F | Resp 16 | Ht 67.0 in | Wt 206.0 lb

## 2022-07-04 DIAGNOSIS — Z0001 Encounter for general adult medical examination with abnormal findings: Secondary | ICD-10-CM

## 2022-07-04 DIAGNOSIS — F5104 Psychophysiologic insomnia: Secondary | ICD-10-CM

## 2022-07-04 DIAGNOSIS — E785 Hyperlipidemia, unspecified: Secondary | ICD-10-CM

## 2022-07-04 DIAGNOSIS — Z720 Tobacco use: Secondary | ICD-10-CM | POA: Diagnosis not present

## 2022-07-04 DIAGNOSIS — I10 Essential (primary) hypertension: Secondary | ICD-10-CM | POA: Diagnosis not present

## 2022-07-04 DIAGNOSIS — F330 Major depressive disorder, recurrent, mild: Secondary | ICD-10-CM

## 2022-07-04 DIAGNOSIS — Z0000 Encounter for general adult medical examination without abnormal findings: Secondary | ICD-10-CM

## 2022-07-04 MED ORDER — SERTRALINE HCL 50 MG PO TABS: 50.0000 mg | ORAL_TABLET | Freq: Every day | ORAL | 0 refills | Status: DC

## 2022-07-04 MED ORDER — VARENICLINE TARTRATE 1 MG PO TABS: 1.0000 mg | ORAL_TABLET | Freq: Two times a day (BID) | ORAL | 3 refills | Status: DC

## 2022-07-04 MED ORDER — VARENICLINE TARTRATE (STARTER) 0.5 MG X 11 & 1 MG X 42 PO TBPK: ORAL_TABLET | ORAL | 0 refills | Status: DC

## 2022-07-04 NOTE — Progress Notes (Addendum)
Subjective:  Patient ID: Tracey [name], female    DOB: Jul 03, 1975  Age: 47 y.o. MRN: [id number]  CC: Annual Exam, Depression, Hypertension, and Hyperlipidemia   HPI Chau Savell Kirker presents for a CPX and f/up ---  She stopped taking sertraline and Wellbutrin but would like to restart sertraline because she has been experiencing irritability.  She is ready to quit smoking.  Outpatient Medications Prior to Visit  Medication Sig Dispense Refill   aspirin 81 MG EC tablet Take 81 mg by mouth daily. Swallow whole.     Fluocinolone Acetonide Scalp (DERMA-SMOOTHE/FS SCALP) 0.01 % OIL Apply to affected scalp 3-4 times per week. Do not apply to face. 118.28 mL 1   fluticasone (FLONASE) 50 MCG/ACT nasal spray Place 2 sprays into both nostrils daily. 16 g 0   levonorgestrel (MIRENA, 52 MG,) 20 MCG/24HR IUD 1 Intra Uterine Device (1 each total) by Intrauterine route once for 1 dose. 1 each 0   lisinopril (ZESTRIL) 20 MG tablet Take 1.5 tablets (30 mg total) by mouth daily. 135 tablet 2   Multiple Vitamins-Minerals (MULTIVITAMIN WITH MINERALS) tablet Take 1 tablet by mouth daily.     Probiotic Product (PROBIOTIC DAILY PO) Take 1 tablet by mouth daily.      rosuvastatin (CRESTOR) 10 MG tablet Take 1 tablet (10 mg total) by mouth daily. 90 tablet 3   Semaglutide-Weight Management (WEGOVY) 0.25 MG/0.5ML SOAJ 0.25 mg Subcutaneous once weekly 30 days 2 mL 0   Vitamin D, Ergocalciferol, (DRISDOL) 1.25 MG (50000 UNIT) CAPS capsule Take 1 capsule (50,000 Units total) by mouth every 7 (seven) days. 12 capsule 3   No facility-administered medications prior to visit.    ROS Review of Systems  Constitutional:  Positive for fatigue. Negative for appetite change, chills, diaphoresis, fever and unexpected weight change.  HENT: Negative.    Eyes: Negative.   Respiratory:  Negative for cough, chest tightness, shortness of breath and wheezing.   Cardiovascular:  Negative for chest pain, palpitations and leg  swelling.  Gastrointestinal:  Negative for abdominal pain, constipation, diarrhea, nausea and vomiting.  Endocrine: Negative.   Genitourinary: Negative.  Negative for difficulty urinating.  Musculoskeletal: Negative.  Negative for back pain and myalgias.  Skin: Negative.   Neurological:  Negative for dizziness and weakness.  Hematological:  Negative for adenopathy. Does not bruise/bleed easily.  Psychiatric/Behavioral:  Positive for dysphoric mood and sleep disturbance. Negative for decreased concentration and suicidal ideas. The patient is not nervous/anxious.     Objective:  BP 126/78 (BP Location: Left Arm, Patient Position: Sitting, Cuff Size: Large)   Pulse 74   Temp 97.7 F (36.5 C) (Oral)   Resp 16   Ht  (1.702 m)   Wt 206 lb (93.4 kg)   SpO2 99%   BMI 32.26 kg/m   BP Readings from Last 3 Encounters:  07/04/22 126/78  06/12/22 124/78  03/23/22 124/70    Wt Readings from Last 3 Encounters:  07/04/22 206 lb (93.4 kg)  06/12/22 214 lb (97.1 kg)  03/23/22 216 lb (98 kg)    Physical Exam Vitals reviewed.  HENT:     Nose: Nose normal.     Mouth/Throat:     Mouth: Mucous membranes are moist.  Eyes:     General: No scleral icterus.    Conjunctiva/sclera: Conjunctivae normal.  Cardiovascular:     Rate and Rhythm: Normal rate and regular rhythm.     Heart sounds: No murmur heard. Pulmonary:  Effort: Pulmonary effort is normal.     Breath sounds: No stridor. No wheezing, rhonchi or rales.  Abdominal:     General: Abdomen is flat.     Palpations: There is no mass.     Tenderness: There is no abdominal tenderness. There is no guarding.     Hernia: No hernia is present.  Musculoskeletal:        General: Normal range of motion.     Cervical back: Neck supple.     Right lower leg: No edema.     Left lower leg: No edema.  Lymphadenopathy:     Cervical: No cervical adenopathy.  Skin:    General: Skin is warm and dry.  Neurological:     General: No focal  deficit present.     Mental Status: She is alert. Mental status is at baseline.  Psychiatric:        Mood and Affect: Mood normal.        Behavior: Behavior normal.     Lab Results  Component Value Date   WBC 5.7 07/04/2022   HGB 13.3 07/04/2022   HCT 39.9 07/04/2022   PLT 266.0 07/04/2022   GLUCOSE 89 07/04/2022   CHOL 122 07/04/2022   TRIG 67.0 07/04/2022   HDL 35.20 (L) 07/04/2022   LDLCALC 74 07/04/2022   ALT 24 06/12/2022   AST 18 06/12/2022   NA 138 07/04/2022   K 3.9 07/04/2022   CL 106 07/04/2022   CREATININE 1.03 07/04/2022   BUN 8 07/04/2022   CO2 26 07/04/2022   TSH 1.13 07/04/2022   INR 0.9 04/02/2020   HGBA1C 5.5 07/21/2020    MM DIAG BREAST TOMO UNI RIGHT  Result Date: 08/24/2021 CLINICAL DATA:  47 year old female recalled from screening mammogram dated 08/11/2021 for a possible right breast asymmetry. EXAM: DIGITAL DIAGNOSTIC UNILATERAL RIGHT MAMMOGRAM WITH TOMOSYNTHESIS AND CAD TECHNIQUE: Right digital diagnostic mammography and breast tomosynthesis was performed. The images were evaluated with computer-aided detection. COMPARISON:  Previous exam(s). ACR Breast Density Category d: The breast tissue is extremely dense, which lowers the sensitivity of mammography. FINDINGS: Previously described, possible asymmetry in the lateral right breast at mid depth on the cc projection only resolves into well dispersed fibroglandular tissue on additional views. No suspicious findings identified. IMPRESSION: No mammographic evidence of malignancy. RECOMMENDATION: Screening mammogram in one year.(Code:SM-B-01Y) I have discussed the findings and recommendations with the patient. If applicable, a reminder letter will be sent to the patient regarding the next appointment. BI-RADS CATEGORY  1: Negative. Electronically Signed   By: Sande Brothers M.D.   On: 08/24/2021 15:34   Assessment & Plan:   Primary hypertension- Her blood pressure is adequately well-controlled. -     Basic  metabolic panel; Future -     CBC with Differential/Platelet; Future -     TSH; Future  Dyslipidemia, goal LDL below 70 - LDL goal achieved. Doing well on the statin  -     Lipid panel; Future -     TSH; Future  Tobacco abuse -     Varenicline Tartrate; Take 1 tablet (1 mg total) by mouth 2 (two) times daily.  Dispense: 60 tablet; Refill: 3 -     Varenicline Tartrate (Starter); Take one 0.5 mg tablet by mouth once daily for 3 days, then increase to one 0.5 mg tablet twice daily for 4 days, then increase to one 1 mg tablet twice daily.  Dispense: 53 each; Refill: 0  Mild episode of recurrent major  depressive disorder- Will restart the SSRI. -     Sertraline HCl; Take 1 tablet (50 mg total) by mouth daily.  Dispense: 90 tablet; Refill: 0  Encounter for general adult medical examination with abnormal findings- Exam completed, labs reviewed, vaccines reviewed, cancer screenings are up-to-date, patient education was given.     Follow-up: Return in about 6 months (around 01/03/2023).  Sanda Linger, MD

## 2022-07-04 NOTE — Patient Instructions (Signed)

## 2022-07-05 LAB — BASIC METABOLIC PANEL
BUN: 8 mg/dL (ref 6–23)
CO2: 26 mEq/L (ref 19–32)
Calcium: 9 mg/dL (ref 8.4–10.5)
Chloride: 106 mEq/L (ref 96–112)
Creatinine, Ser: 1.03 mg/dL (ref 0.40–1.20)
GFR: 66.17 mL/min (ref >60.00)
Glucose, Bld: 89 mg/dL (ref 70–99)
Potassium: 3.9 mEq/L (ref 3.5–5.1)
Sodium: 138 mEq/L (ref 135–145)

## 2022-07-05 LAB — CBC WITH DIFFERENTIAL/PLATELET
Basophils Absolute: 0 10*3/uL (ref 0.0–0.1)
Basophils Relative: 0.7 % (ref 0.0–3.0)
Eosinophils Absolute: 0.2 10*3/uL (ref 0.0–0.7)
Eosinophils Relative: 3.7 % (ref 0.0–5.0)
HCT: 39.9 % (ref 36.0–46.0)
Hemoglobin: 13.3 g/dL (ref 12.0–15.0)
Lymphocytes Relative: 41.3 % (ref 12.0–46.0)
Lymphs Abs: 2.3 10*3/uL (ref 0.7–4.0)
MCHC: 33.4 g/dL (ref 30.0–36.0)
MCV: 88.2 fl (ref 78.0–100.0)
Monocytes Absolute: 0.5 10*3/uL (ref 0.1–1.0)
Monocytes Relative: 9.5 % (ref 3.0–12.0)
Neutro Abs: 2.5 10*3/uL (ref 1.4–7.7)
Neutrophils Relative %: 44.8 % (ref 43.0–77.0)
Platelets: 266 10*3/uL (ref 150.0–400.0)
RBC: 4.52 Mil/uL (ref 3.87–5.11)
RDW: 13.7 % (ref 11.5–15.5)
WBC: 5.7 10*3/uL (ref 4.0–10.5)

## 2022-07-05 LAB — LIPID PANEL
Cholesterol: 122 mg/dL (ref 0–200)
HDL: 35.2 mg/dL — ABNORMAL LOW (ref >39.00)
LDL Cholesterol: 74 mg/dL (ref 0–99)
NonHDL: 87.12
Total CHOL/HDL Ratio: 3
Triglycerides: 67 mg/dL (ref 0.0–149.0)
VLDL: 13.4 mg/dL (ref 0.0–40.0)

## 2022-07-05 LAB — TSH: TSH: 1.13 u[IU]/mL (ref 0.35–5.50)

## 2022-07-07 ENCOUNTER — Encounter: Payer: Self-pay | Admitting: Internal Medicine

## 2022-07-07 ENCOUNTER — Other Ambulatory Visit: Payer: Self-pay | Admitting: Internal Medicine

## 2022-07-20 ENCOUNTER — Encounter: Payer: BLUE CROSS/BLUE SHIELD | Admitting: Internal Medicine

## 2022-08-03 ENCOUNTER — Ambulatory Visit: Payer: BLUE CROSS/BLUE SHIELD | Admitting: Neurology

## 2022-08-03 ENCOUNTER — Encounter: Payer: Self-pay | Admitting: Neurology

## 2022-08-03 VITALS — BP 114/78 | HR 79 | Ht 67.0 in | Wt 194.4 lb

## 2022-08-03 DIAGNOSIS — F0670 Mild neurocognitive disorder due to known physiological condition without behavioral disturbance: Secondary | ICD-10-CM

## 2022-08-03 DIAGNOSIS — I999 Unspecified disorder of circulatory system: Secondary | ICD-10-CM

## 2022-08-03 DIAGNOSIS — G3184 Mild cognitive impairment, so stated: Secondary | ICD-10-CM | POA: Insufficient documentation

## 2022-08-03 NOTE — Progress Notes (Signed)
SLEEP MEDICINE CLINIC    Provider:  Melvyn Novas, MD  Primary Care Physician:  No primary care provider on file. No primary provider on file.   Primary neurologist ; Dr Pearlean Brownie, MD / [facility] Austin.    Referring Provider: upon self referral for a new problem,         Chief Complaint according to patient   Patient presents with:                HISTORY OF PRESENT ILLNESS:  Tracey [name] is a 47 y.o. female patient who is seen upon referral on 08/03/2022 from ? for an evaluation of INSOMNIA and MEMORY- she is an active stroke service patient ,seen last by Ihor Austin, NP, and had missed an appointment in February 2024 with her. Her concerns of sleep were already worked up , no organic sleep disorder was identified and no follow up  was offered in the sleep clinic. I had seen the patient for the work up of cryptogenic stroke , and tested already negative for a sleep apnea.    Her memory concerns should be addressed by her primary neurologist   Chief concern according to patient :  " My memory is just not what it was "      Tracey [name] was no longer followed by the sleep clinic and her neurologic care returned to her primary Neurologist, Dr Pearlean Brownie and [facility] Austin.  Tracey [name] is a 40 - year- old African American female cryptogenic Stroke patient had been last seen  by me to help with cryptogenic stroke work up for a RV after a negative sleep study.  The patient has been seen in consult and followed up with STROKE ,MD,  and seen upon referral from PCP and cardiology, she is followed by the stroke team. She is a mother of a 9 year-old, and she has a Mirena IUD. She was diagnosed with insulin resistance in the meantime, sees [facility], and she has been sleeping better.  Was given Lunesta ( Dr. [name]) which is "putting her out.".       Review of Systems: Out of a complete 14 system review, the patient complains of only the following symptoms, and all  other reviewed systems are negative.:     I had seen the patient for the work up of cryptogenic stroke , and tested already negative for a sleep apnea.    Social History   Socioeconomic History   Marital status: Married    Spouse name: Not on file   Number of children: Not on file   Years of education: Not on file   Highest education level: Some college, no degree  Occupational History   Occupation: Not Working  Tobacco Use   Smoking status: Every Day    Packs/day: .25    Types: Cigarettes   Smokeless tobacco: Never   Tobacco comments:    4 cig/day  Vaping Use   Vaping Use: Never used  Substance and Sexual Activity   Alcohol use: No    Alcohol/week: 0.0 standard drinks of alcohol   Drug use: No   Sexual activity: Yes    Birth control/protection: I.U.D.  Other Topics Concern   Not on file  Social History Narrative   Lives at home with spouse and her 2 children   Right handed   Caffeine: 2 cups/day   Social Determinants of Health   Financial Resource Strain: Low Risk  (07/04/2022)  Overall Financial Resource Strain (CARDIA)    Difficulty of Paying Living Expenses: Not hard at all  Food Insecurity: No Food Insecurity (07/04/2022)   Hunger Vital Sign    Worried About Running Out of Food in the Last Year: Never true    Ran Out of Food in the Last Year: Never true  Transportation Needs: No Transportation Needs (07/04/2022)   PRAPARE - Administrator, Civil Service (Medical): No    Lack of Transportation (Non-Medical): No  Physical Activity: Insufficiently Active (07/04/2022)   Exercise Vital Sign    Days of Exercise per Week: 3 days    Minutes of Exercise per Session: 30 min  Stress: Stress Concern Present (07/04/2022)   [employer] of Occupational Health - Occupational Stress Questionnaire    Feeling of Stress : Very much  Social Connections: Moderately Integrated (07/04/2022)   Social Connection and Isolation Panel [NHANES]    Frequency of  Communication with Friends and Family: More than three times a week    Frequency of Social Gatherings with Friends and Family: Twice a week    Attends Religious Services: More than 4 times per year    Active Member of [employer] or Organizations: No    Attends Engineer, structural: Not on file    Marital Status: Married    Family History  Problem Relation Age of Onset   Hypertension Mother    Anxiety disorder Father    Depression Father    Alcoholism Father    Diabetes Maternal Aunt    Diabetes Maternal Uncle    Breast cancer Paternal Aunt     Past Medical History:  Diagnosis Date   Anxiety    Bronchitis    Carpal tunnel syndrome on both sides    Constipation    Depression    Family history of adverse reaction to anesthesia    mother had problems waking up from surgery.   Gestational diabetes    Gestational diabetes mellitus    Normal 2 hr GTT postpartum   Headache    migraines   High cholesterol    Hypertension    Infertility, female    Lactose intolerance    Oligohydramnios antepartum 11/09/2017   Palpitations    Panic attack 2017   diagnosed 3 months ago. no meds presently   Reflux    did not filll prescription   S/P percutaneous patent foramen ovale closure 06/16/2020   s/p PFO occluder device with a a 25 MM AMPLATZER by Dr. Excell Seltzer   Stroke [facility])    Swelling    Vitamin D deficiency     Past Surgical History:  Procedure Laterality Date   BREAST EXCISIONAL BIOPSY Right    cyst   BUBBLE STUDY  04/16/2020   Procedure: BUBBLE STUDY;  Surgeon: Chilton Si, MD;  Location: [facility] ENDOSCOPY;  Service: Cardiovascular;;   DILATION AND EVACUATION N/A 07/26/2015   Procedure: DILATATION AND EVACUATION;  Surgeon: Brock Bad, MD;  Location: [facility] ORS;  Service: Gynecology;  Laterality: N/A;   PATENT FORAMEN OVALE(PFO) CLOSURE N/A 06/16/2020   Procedure: PATENT FORAMEN OVALE (PFO) CLOSURE;  Surgeon: Tonny Bollman, MD;  Location: [facility] INVASIVE CV LAB;  Service:  Cardiovascular;  Laterality: N/A;   TEE WITHOUT CARDIOVERSION N/A 04/16/2020   Procedure: TRANSESOPHAGEAL ECHOCARDIOGRAM (TEE);  Surgeon: Chilton Si, MD;  Location: [facility] ENDOSCOPY;  Service: Cardiovascular;  Laterality: N/A;     Current Outpatient Medications on File Prior to Visit  Medication Sig Dispense Refill   aspirin  81 MG EC tablet Take 81 mg by mouth daily. Swallow whole.     Fluocinolone Acetonide Scalp (DERMA-SMOOTHE/FS SCALP) 0.01 % OIL Apply to affected scalp 3-4 times per week. Do not apply to face. 118.28 mL 1   fluticasone (FLONASE) 50 MCG/ACT nasal spray Place 2 sprays into both nostrils daily. 16 g 0   levonorgestrel (MIRENA, 52 MG,) 20 MCG/24HR IUD 1 Intra Uterine Device (1 each total) by Intrauterine route once for 1 dose. 1 each 0   lisinopril (ZESTRIL) 20 MG tablet Take 1.5 tablets (30 mg total) by mouth daily. 135 tablet 2   Multiple Vitamins-Minerals (MULTIVITAMIN WITH MINERALS) tablet Take 1 tablet by mouth daily.     Probiotic Product (PROBIOTIC DAILY PO) Take 1 tablet by mouth daily.      rosuvastatin (CRESTOR) 10 MG tablet Take 1 tablet (10 mg total) by mouth daily. 90 tablet 3   sertraline (ZOLOFT) 50 MG tablet Take 1 tablet (50 mg total) by mouth daily. 90 tablet 0   Vitamin D, Ergocalciferol, (DRISDOL) 1.25 MG (50000 UNIT) CAPS capsule Take 1 capsule (50,000 Units total) by mouth every 7 (seven) days. 12 capsule 3   No current facility-administered medications on file prior to visit.    No Known Allergies   DIAGNOSTIC DATA (LABS, IMAGING, TESTING) - I reviewed patient records, labs, notes, testing and imaging myself where available.  MRI stroke 04-03-2020: left leg weakness ,clumsiness, facial numbness.  COMPARISON:  CT head April 02, 2020   FINDINGS: Brain: Small acute infarct in the juxtacortical high right posterior frontal lobe (series 9, image 92). Minimal edema without mass effect. No acute hemorrhage. Additional scattered mild  T2/FLAIR hyperintensities within the white matter, mildly advanced for age. No hydrocephalus. No mass lesion. No abnormal enhancement. No abnormal mass effect or extra-axial fluid collection.    Lab Results  Component Value Date   WBC 5.7 07/04/2022   HGB 13.3 07/04/2022   HCT 39.9 07/04/2022   MCV 88.2 07/04/2022   PLT 266.0 07/04/2022      Component Value Date/Time   NA 138 07/04/2022 1644   NA 136 04/28/2021 1517   K 3.9 07/04/2022 1644   CL 106 07/04/2022 1644   CO2 26 07/04/2022 1644   GLUCOSE 89 07/04/2022 1644   BUN 8 07/04/2022 1644   BUN 10 04/28/2021 1517   CREATININE 1.03 07/04/2022 1644   CALCIUM 9.0 07/04/2022 1644   PROT 6.8 06/12/2022 1540   ALBUMIN 4.4 06/12/2022 1540   AST 18 06/12/2022 1540   ALT 24 06/12/2022 1540   ALKPHOS 44 06/12/2022 1540   BILITOT 0.4 06/12/2022 1540   GFRNONAA >60 04/03/2020 0343   GFRAA >60 12/06/2017 0653   Lab Results  Component Value Date   CHOL 122 07/04/2022   HDL 35.20 (L) 07/04/2022   LDLCALC 74 07/04/2022   TRIG 67.0 07/04/2022   CHOLHDL 3 07/04/2022   Lab Results  Component Value Date   HGBA1C 5.5 07/21/2020   Lab Results  Component Value Date   VITAMINB12 825 07/21/2020   Lab Results  Component Value Date   TSH 1.13 07/04/2022    PHYSICAL EXAM:  Today's Vitals   08/03/22 1331  BP: 114/78  Pulse: 79  Weight: 194 lb 6.4 oz (88.2 kg)  Height: 5\' 7"  (1.702 m)   Body mass index is 30.45 kg/m.   Wt Readings from Last 3 Encounters:  08/03/22 194 lb 6.4 oz (88.2 kg)  07/04/22 206 lb (93.4 kg)  06/12/22 214  lb (97.1 kg)     Ht Readings from Last 3 Encounters:  08/03/22 5\' 7"  (1.702 m)  07/04/22 5\' 7"  (1.702 m)  06/12/22 5\' 7"  (1.702 m)      General: The patient is awake, alert and appears not in acute distress. The patient is well groomed. Head: Normocephalic, atraumatic.   NEUROLOGIC EXAM: The patient is awake and alert, oriented to place and time.   Memory subjective described as  impaired.  Attention span & concentration ability appears normal.  Speech is fluent,  without  dysarthria, dysphonia or aphasia.  Mood and affect are appropriate.   Cranial nerves: no loss of smell or taste reported  Pupils are equal and briskly reactive to light. Funduscopic exam deferred.  Extraocular movements in vertical and horizontal planes were intact and without nystagmus. No Diplopia. Visual fields by finger perimetry are intact. Hearing was intact to soft voice and finger rubbing.    Facial sensation intact to fine touch.  Facial motor strength is symmetric and tongue and uvula move midline.  Neck ROM : rotation, tilt and flexion extension were normal for age and shoulder shrug was symmetrical.    Motor exam:  Symmetric bulk, tone and ROM.   Normal tone without cog wheeling, symmetric grip strength .   Sensory:  Proprioception tested in the upper extremities was normal.   Coordination: Rapid alternating movements in the fingers/hands were of normal speed.   intact without evidence of ataxia, dysmetria or tremor.   Gait and station: Patient could rise unassisted from a seated position, walked without assistive device.  Deep tendon reflexes: in the  upper and lower extremities are symmetric and intact.  Babinski response was deferred.    ASSESSMENT AND PLAN:  Tracey [name] was no longer followed by the sleep clinic and returned to her primary Neurologist, Dr Pearlean Brownie [facility] Austin.     47 y.o. year old female  here assigned to me by (?) for memory deficits, which should be evaluated with her primary neurologist, these likely are stroke related manifestation.     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 She noted impaired multi tasking.  She could n't read the numbers on a clock  while she recovered  in hospital 03-2020.  THIS IS NOT NEW>   Clock face, 3 D cube part of the MMSE was impaired.  Her serial 7 math was impaired, but she could score 5  points on WORLD.    1)  visible slowing , problems in visio-spatial execution.     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 This patient was referred to me 05-2020 in follow up for a cryptogenic stroke. Her work up has been completed and she is no longer a sleep clinic patient.    After spending a total time of  35  minutes face to face and additional time for physical and neurologic examination, review of laboratory studies,  personal review of imaging studies, reports and  results of other testing and review of referral information / records as far as provided in visit,    Refer back to primary neurologist for disability related testing and recommendations.   Electronically signed by: Melvyn Novas, MD 08/03/2022 1:52 PM  [facility] and [facility] Board certified by The [employer] of Sleep Medicine and Diplomate of the [employer] of Sleep Medicine. Board certified through the ABPN, Fellow of the [employer] of Neurology. [facility].

## 2022-08-03 NOTE — Patient Instructions (Signed)
I will refer to Dr Pearlean Brownie for cognitive impairment as a consequence of STROKE.  This patient was referred to me 05-2020 in follow up for a cryptogenic stroke. Her work up has been completed and she is no longer a sleep clinic patient.

## 2022-08-15 DIAGNOSIS — Z1231 Encounter for screening mammogram for malignant neoplasm of breast: Secondary | ICD-10-CM

## 2022-09-18 ENCOUNTER — Other Ambulatory Visit: Payer: Self-pay | Admitting: Internal Medicine

## 2022-09-18 DIAGNOSIS — Z1231 Encounter for screening mammogram for malignant neoplasm of breast: Secondary | ICD-10-CM

## 2022-09-19 ENCOUNTER — Other Ambulatory Visit: Payer: Self-pay | Admitting: Internal Medicine

## 2022-09-19 DIAGNOSIS — F330 Major depressive disorder, recurrent, mild: Secondary | ICD-10-CM

## 2022-10-03 ENCOUNTER — Telehealth: Payer: BLUE CROSS/BLUE SHIELD | Admitting: Physician Assistant

## 2022-10-03 ENCOUNTER — Other Ambulatory Visit (HOSPITAL_BASED_OUTPATIENT_CLINIC_OR_DEPARTMENT_OTHER): Payer: Self-pay

## 2022-10-03 ENCOUNTER — Ambulatory Visit
Admission: RE | Admit: 2022-10-03 | Discharge: 2022-10-03 | Disposition: A | Discharge status: Home / Self Care | Payer: BLUE CROSS/BLUE SHIELD | Source: Ambulatory Visit

## 2022-10-03 DIAGNOSIS — R3989 Other symptoms and signs involving the genitourinary system: Secondary | ICD-10-CM | POA: Diagnosis not present

## 2022-10-03 DIAGNOSIS — Z1231 Encounter for screening mammogram for malignant neoplasm of breast: Secondary | ICD-10-CM

## 2022-10-03 MED ORDER — CEPHALEXIN 500 MG PO CAPS
500.0000 mg | ORAL_CAPSULE | Freq: Two times a day (BID) | ORAL | 0 refills | Status: AC
  Filled 2022-10-03: qty 14, 7d supply, fill #0

## 2022-10-03 NOTE — Progress Notes (Signed)
I have spent 5 minutes in review of e-visit questionnaire, review and updating patient chart, medical decision making and response to patient.   William Cody Martin, PA-C    

## 2022-10-03 NOTE — Progress Notes (Signed)

## 2022-10-23 NOTE — Progress Notes (Unsigned)
[facility] [address] [place]. [place] [address] [phone]       STROKE FOLLOW UP NOTE  Tracey [name] Date of Birth:  [birth year] Medical Record Number:  [id number]   Reason for Referral: stroke follow up  Virtual Visit via Video Note  Virtual visit completed through MyChart, a video enabled telemedicine application. Due to national recommendations of social distancing due to COVID-19, a virtual visit is felt to be most appropriate for this patient at this time. Reviewed limitations, risks, security and privacy concerns of performing a virtual visit and the availability of in person appointments. I also reviewed that there may be a patient responsible charge related to this service. The patient agreed to proceed.    Patient location: home Provider location: in office, [facility] Persons participating in this virtual visit: patient     SUBJECTIVE:    HPI:   Update 10/24/2022 JM: Patient returns for follow-up visit.  She was seen by Dr. Vickey Huger on 08/03/2022 for concerns of memory loss.  Previously discussed memory loss concerns with patient which has been present since her stroke and has also previously discussed this at prior visit with Dr. Vickey Huger who recommended cognitive testing and consideration of Aricept and treating insomnia with Lunesta. At recent visit with Dr. Vickey Huger, MMSE 25/30, she was advised to follow up with Dr. Pearlean Brownie for cognitive impairment concerns as this is likely result from her prior stroke.       History provided for reference purposes only Update 04/05/2021 JM: patient being seen for 5 month stroke follow up via MyChart VV per pt request. Overall stable from stroke standpoint without new stroke/TIA symptoms.  Residual occasional word finding difficulty and occassional short term memory difficulties stable.  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 C/o worsening vision since stroke - difficulty seeing further away.  Compliant on aspirin and Crestor without side effects. C/o worsening insomnia over the past 1.5 months. Will fall asleep with TV on. Will wake up around 1am and have difficulty falling back asleep. On Lunesta 2mg  nightly but difficulty tolerating due to morning grogginess.  No further concerns at this time.  Update 11/03/2020 JM: Tracey [name] is being seen for 3 month stroke follow up unaccompanied. Overall stable.  Speech has been greatly improving since prior visit.  Occasional word finding difficulty more so when speaking to those she is not familiar with or with increased anxiety.  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  Management has been very supportive and has been slowly relearning prior job duties and training as needed.  Also reports fluctuation of left hand fingertips and tips of toes on left foot numbness especially in the evening while relaxing - present since stroke onset - not new.  Recently started on sertraline for anxiety worsened post stroke.  denies new stroke/TIA symptoms. Compliant on aspirin, Plavix and Crestor-denies associated side effects.  Blood pressure today 139/88.  Being followed by healthy weight and wellness with a 5 pound weight loss since May.  Followed by Dr. Vickey Huger for insomnia on Lunesta with benefit.  No further concerns at this time.  Update 08/02/2020 JM: Tracey [name] returns for 74-month stroke follow-up accompanied by her 36 yo daughter  Doing well since prior visit from stroke standpoint without new stroke/TIA symptoms.  Residual cognitive impairment gradually improving with greatest difficulty with  recall or getting words together especially when talking to somebody she does not know or with increased anxiety (per SLP difficulties with thought organization and managing some day-to-day tasks).  She  continues to work routinely with neuro rehab SLP.  She remains on short term disability and is eager to return back to work at [employer].   Reports compliance on aspirin, plavix (post PFO closure) and Crestor without associated side effects Blood pressure today 135/83  S/p PFO closure 06/16/2020 without complication Sleep study 06/13/2020 negative for sleep apnea (although only captured 3.5 hours of sleep however all sleep stages were captured) -chronic history of insomnia and migraines recently started on Lunesta and Ubrelvy per PCP with benefit.  Also on bupropion for depression/anxiety.  No further concerns at this time  Initial visit 05/03/2020 JM: Tracey [name] is being seen for hospital follow-up accompanied by her husband.  She reports gradual improvement since discharge with residual LLE weakness (feels like knee wants to hyperextend), gait impairment and cognitive impairment.  Currently working with PT and has initial evaluation with SLP tomorrow for cognitive therapy.  She has not returned back to work currently on short-term disability working for [name].  Baseline depression/anxiety with worsening anxiety and depression but also self discontinued bupropion at hospital discharge as she was fearful of possible increased stroke risk with continued use.  Reports increased fatigue since her stroke with fatigue PTA as well as snoring, witnessed apnea and insomnia.  She has not previously underwent sleep study.  Denies new stroke/TIA symptoms.  She has completed 3 weeks DAPT and remains on aspirin alone without bleeding or bruising.  Remains on Crestor 10 mg daily without myalgias.  Blood pressure today 149/77.  She does admit to smoking 1 cigarette/day with goal of complete tobacco cessation in the near future.  TEE completed 04/16/2020 which showed evidence of PFO.  Initial evaluation with Dr. Excell Seltzer for possible closure scheduled  on 05/28/2020.  Hypercoagulable labs negative.  No further concerns at this time.  Stroke admission 04/02/2020 Tracey [name] is a 47 y.o. female with history of hypertension, anxiety (panic attacks), headaches (migraines), ongoing tobacco use, and depression (just started Wellbutrin on the day of admission)  who presented to the [facility] emergency department  on 04/02/2020 and seen by teleNeurology for acute onset neck discomfort, left leg numbness / weakness, left arm numbness and a pressure sensation at the base of her posterior neck in the midline.   Personally reviewed hospitalization pertinent progress notes, lab work and imaging with summary provided.  Shortly transferred to [facility] evaluated by Dr. Pearlean Brownie with stroke work-up revealing small acute infarct in the juxtacortical high right posterior frontal lobe, possibly embolic secondary to unknown source.  Recommend completion of TEE outpatient.  Hypercoagulable labs pending at discharge.  Recommended DAPT for 3 weeks and aspirin alone. LDL 101 and initiated Crestor 10 mg daily.  A1c 5.1.  Other stroke risk factors include current tobacco use, obesity and history of migraines but no prior stroke history.  Other active problems include suspected sleep apnea, bradycardia and hypokalemia.  Evaluated by therapies and recommended outpatient PT and discharged home in stable condition.  Stroke: Small acute infarct in the juxtacortical high right posterior frontal lobe possibly embolic - etiology unknown. Resultant mild left leg weakness and numbness which is resolving Code Stroke CT Head -  Normal head CT. ASPECTS is 10.     CT head - not ordered MRI head - Small  acute infarct in the juxtacortical high right posterior frontal lobe. Minimal edema without mass effect. Additional mild scattered T2/FLAIR hyperintensities within the white matter, mildly advanced for age. These are nonspecific and could relate to chronic microvascular ischemic disease, prior migraines,  trauma, demyelination, or inflammation. CTA H&N - normal 2D Echo - EF 55 - 60%. No cardiac source of emboli identified.  [employer] Virus 2 - negative LDL - 101 HgbA1c - 5.1 UDS - negative VTE prophylaxis - Lovenox No antithrombotic prior to admission, now on aspirin 81 mg daily and clopidogrel 75 mg daily for 3 weeks then aspirin alone Patient will be counseled to be compliant with her antithrombotic medications Ongoing aggressive stroke risk factor management Therapy recommendations:  Outpt PT Disposition:  Home     ROS:   14 system review of systems performed and negative with exception of those listed in HPI  PMH:  Past Medical History:  Diagnosis Date   Anxiety    Bronchitis    Carpal tunnel syndrome on both sides    Constipation    Depression    Family history of adverse reaction to anesthesia    mother had problems waking up from surgery.   Gestational diabetes    Gestational diabetes mellitus    Normal 2 hr GTT postpartum   Headache    migraines   High cholesterol    Hypertension    Infertility, female    Lactose intolerance    Oligohydramnios antepartum 11/09/2017   Palpitations    Panic attack 2017   diagnosed 3 months ago. no meds presently   Reflux    did not filll prescription   S/P percutaneous patent foramen ovale closure 06/16/2020   s/p PFO occluder device with a a 25 MM AMPLATZER by Dr. Excell Seltzer   Stroke [facility])    Swelling    Vitamin D deficiency     PSH:  Past Surgical History:  Procedure Laterality Date   BREAST EXCISIONAL BIOPSY Right    cyst   BUBBLE STUDY  04/16/2020   Procedure: BUBBLE STUDY;  Surgeon: Chilton Si, MD;  Location: [facility] ENDOSCOPY;  Service: Cardiovascular;;   DILATION AND EVACUATION N/A 07/26/2015   Procedure: DILATATION AND EVACUATION;  Surgeon: Brock Bad, MD;  Location: [facility] ORS;  Service: Gynecology;  Laterality: N/A;   PATENT FORAMEN OVALE(PFO) CLOSURE N/A 06/16/2020   Procedure: PATENT FORAMEN OVALE (PFO)  CLOSURE;  Surgeon: Tonny Bollman, MD;  Location: [facility] INVASIVE CV LAB;  Service: Cardiovascular;  Laterality: N/A;   TEE WITHOUT CARDIOVERSION N/A 04/16/2020   Procedure: TRANSESOPHAGEAL ECHOCARDIOGRAM (TEE);  Surgeon: Chilton Si, MD;  Location: [facility] ENDOSCOPY;  Service: Cardiovascular;  Laterality: N/A;    Social History:  Social History   Socioeconomic History   Marital status: Married    Spouse name: Not on file   Number of children: Not on file   Years of education: Not on file   Highest education level: Some college, no degree  Occupational History   Occupation: Not Working  Tobacco Use   Smoking status: Every Day    Current packs/day: 0.25    Types: Cigarettes   Smokeless tobacco: Never   Tobacco comments:    4 cig/day  Vaping Use   Vaping status: Never Used  Substance and Sexual Activity   Alcohol use: No    Alcohol/week: 0.0 standard drinks of alcohol   Drug use: No   Sexual activity: Yes    Birth control/protection: I.U.D.  Other Topics Concern   Not  on file  Social History Narrative   Lives at home with spouse and her 2 children   Right handed   Caffeine: 2 cups/day   Social Determinants of Health   Financial Resource Strain: Low Risk  (07/04/2022)   Overall Financial Resource Strain (CARDIA)    Difficulty of Paying Living Expenses: Not hard at all  Food Insecurity: No Food Insecurity (07/04/2022)   Hunger Vital Sign    Worried About Running Out of Food in the Last Year: Never true    Ran Out of Food in the Last Year: Never true  Transportation Needs: No Transportation Needs (07/04/2022)   PRAPARE - Administrator, Civil Service (Medical): No    Lack of Transportation (Non-Medical): No  Physical Activity: Insufficiently Active (07/04/2022)   Exercise Vital Sign    Days of Exercise per Week: 3 days    Minutes of Exercise per Session: 30 min  Stress: Stress Concern Present (07/04/2022)   [employer] of Occupational Health - Occupational  Stress Questionnaire    Feeling of Stress : Very much  Social Connections: Moderately Integrated (07/04/2022)   Social Connection and Isolation Panel [NHANES]    Frequency of Communication with Friends and Family: More than three times a week    Frequency of Social Gatherings with Friends and Family: Twice a week    Attends Religious Services: More than 4 times per year    Active Member of [employer] or Organizations: No    Attends Engineer, structural: Not on file    Marital Status: Married  Catering manager Violence: Not on file    Family History:  Family History  Problem Relation Age of Onset   Hypertension Mother    Anxiety disorder Father    Depression Father    Alcoholism Father    Diabetes Maternal Aunt    Diabetes Maternal Uncle    Breast cancer Paternal Aunt     Medications:   Current Outpatient Medications on File Prior to Visit  Medication Sig Dispense Refill   aspirin 81 MG EC tablet Take 81 mg by mouth daily. Swallow whole.     Fluocinolone Acetonide Scalp (DERMA-SMOOTHE/FS SCALP) 0.01 % OIL Apply to affected scalp 3-4 times per week. Do not apply to face. 118.28 mL 1   fluticasone (FLONASE) 50 MCG/ACT nasal spray Place 2 sprays into both nostrils daily. 16 g 0   levonorgestrel (MIRENA, 52 MG,) 20 MCG/24HR IUD 1 Intra Uterine Device (1 each total) by Intrauterine route once for 1 dose. 1 each 0   lisinopril (ZESTRIL) 20 MG tablet Take 1.5 tablets (30 mg total) by mouth daily. 135 tablet 2   Multiple Vitamins-Minerals (MULTIVITAMIN WITH MINERALS) tablet Take 1 tablet by mouth daily.     Probiotic Product (PROBIOTIC DAILY PO) Take 1 tablet by mouth daily.      rosuvastatin (CRESTOR) 10 MG tablet Take 1 tablet (10 mg total) by mouth daily. 90 tablet 3   sertraline (ZOLOFT) 50 MG tablet Take 1 tablet by mouth daily. 90 tablet 0   Vitamin D, Ergocalciferol, (DRISDOL) 1.25 MG (50000 UNIT) CAPS capsule Take 1 capsule (50,000 Units total) by mouth every 7 (seven) days. 12  capsule 3   No current facility-administered medications on file prior to visit.    Allergies:  No Known Allergies    OBJECTIVE:  There were no vitals filed for this visit. There is no height or weight on file to calculate BMI.   Physical Exam General: well developed,  well nourished, seated, in no evident distress Head: head normocephalic and atraumatic.   Neck: supple with no carotid or supraclavicular bruits Cardiovascular: regular rate and rhythm, no murmurs Musculoskeletal: no deformity Skin:  no rash/petichiae Vascular:  Normal pulses all extremities   Neurologic Exam Mental Status: Awake and fully alert. Oriented to place and time. Recent and remote memory intact. Attention span, concentration and fund of knowledge appropriate. Mood and affect appropriate.  Cranial Nerves: Fundoscopic exam reveals sharp disc margins. Pupils equal, briskly reactive to light. Extraocular movements full without nystagmus. Visual fields full to confrontation. Hearing intact. Facial sensation intact. Face, tongue, palate moves normally and symmetrically.  Motor: Normal bulk and tone. Normal strength in all tested extremity muscles Sensory.: intact to touch , pinprick , position and vibratory sensation.  Coordination: Rapid alternating movements normal in all extremities. Finger-to-nose and heel-to-shin performed accurately bilaterally. Gait and Station: Arises from chair without difficulty. Stance is normal. Gait demonstrates normal stride length and balance ***. Tandem walk and heel toe ***.  Reflexes: 1+ and symmetric. Toes downgoing.         ASSESSMENT: Tracey [name] is a 47 y.o. year old female with small acute infarct in the juxtacortical high right posterior frontal lobe on 03/02/2021 possibly embolic secondary to unknown source although evidence of PFO. Vascular risk factors include HTN, HLD, tobacco use, obesity, migraines and PFO     PLAN:  R frontal lobe stroke, cryptogenic:   Residual deficit: Cognitive communication deficit -stable Worsening vision - present since stroke. Unsure if actually stroke related or coincidental as reports difficulty seeing objects further way.  Advised to follow-up with her eye doctor for evaluation PFO s/p closure 06/16/2020 Hypercoagulable labs and 30-day cardiac event monitor negative Continue aspirin 81 mg daily  and Crestor 10 mg daily for secondary stroke prevention.   Discussed secondary stroke prevention measures and importance of close PCP follow up for aggressive stroke risk factor management including BP goal<130/90, and HLD with LDL goal<70 PFO: As evidenced on TEE 04/16/2020 S/p closure 06/16/2020 for ROPE score 5 indicating 34% chance that stroke was due to PFO Followed by cardiology  Chronic insomnia: Followed by Dr. Vickey Huger on [place]. C/o worsening past 1.5 months and difficulty tolerating Lunesta due to grogginess the following day.  Discussed improving sleep habits and good sleep hygiene.  If persists, advised her to schedule a f/u visit with Dr. Vickey Huger     Stable from stroke standpoint -follow-up as needed   CC:  [facility] provider: Dr. Pearlean Brownie Pcp, No    I spent 38 minutes of face-to-face and non-face-to-face time with patient via MyChart video visit.  This included previsit chart review, lab review, study review, electronic health record documentation, patient education and discussion regarding history of prior stroke and residual deficits, secondary stroke prevention measures and aggressive stroke risk factor management, other concerns as noted above and answered all other questions to patient satisfaction   Ihor Austin, AGNP-BC  [facility] [address] [place], [place] [address]  Phone [phone] Fax [phone] Note: This document was prepared with digital dictation and possible smart phrase technology. Any transcriptional errors that result from this process are  unintentional.

## 2022-10-24 ENCOUNTER — Encounter: Payer: Self-pay | Admitting: Adult Health

## 2022-10-24 ENCOUNTER — Other Ambulatory Visit (HOSPITAL_BASED_OUTPATIENT_CLINIC_OR_DEPARTMENT_OTHER): Payer: Self-pay

## 2022-10-24 ENCOUNTER — Ambulatory Visit: Payer: BLUE CROSS/BLUE SHIELD | Admitting: Adult Health

## 2022-10-24 VITALS — BP 127/78 | HR 60 | Ht 66.0 in | Wt 194.0 lb

## 2022-10-24 DIAGNOSIS — I639 Cerebral infarction, unspecified: Secondary | ICD-10-CM | POA: Diagnosis not present

## 2022-10-24 DIAGNOSIS — F0670 Mild neurocognitive disorder due to known physiological condition without behavioral disturbance: Secondary | ICD-10-CM

## 2022-10-24 DIAGNOSIS — E538 Deficiency of other specified B group vitamins: Secondary | ICD-10-CM

## 2022-10-24 DIAGNOSIS — I999 Unspecified disorder of circulatory system: Secondary | ICD-10-CM | POA: Diagnosis not present

## 2022-10-24 DIAGNOSIS — G4700 Insomnia, unspecified: Secondary | ICD-10-CM

## 2022-10-24 DIAGNOSIS — R41841 Cognitive communication deficit: Secondary | ICD-10-CM

## 2022-10-24 MED ORDER — TRAZODONE HCL 50 MG PO TABS
25.0000 mg | ORAL_TABLET | Freq: Every day | ORAL | 5 refills | Status: DC
  Filled 2022-10-24: qty 15, 30d supply, fill #0
  Filled 2022-12-20: qty 15, 30d supply, fill #1

## 2022-10-24 NOTE — Patient Instructions (Addendum)
Referral placed to neuropsychology for further evaluation of cognitive impairment post stroke - you will be called to schedule  Recommend starting trazodone 25mg  (half 50mg  tab) for insomnia - please call if tolerating dosage well but no benefit for dosage increase  Continue aspirin 81 mg daily  and Crestor  for secondary stroke prevention  Continue to follow up with PCP regarding blood pressure and cholesterol management  Maintain strict control of hypertension with blood pressure goal below 130/90 and cholesterol with LDL cholesterol (bad cholesterol) goal below 70 mg/dL.   Signs of a Stroke? Follow the BEFAST method:  Balance Watch for a sudden loss of balance, trouble with coordination or vertigo Eyes Is there a sudden loss of vision in one or both eyes? Or double vision?  Face: Ask the person to smile. Does one side of the face droop or is it numb?  Arms: Ask the person to raise both arms. Does one arm drift downward? Is there weakness or numbness of a leg? Speech: Ask the person to repeat a simple phrase. Does the speech sound slurred/strange? Is the person confused ? Time: If you observe any of these signs, call 911.     Followup in the future with me in 6 months or call earlier if needed     Thank you for coming to see [place] at [facility]. I hope we have been able to provide you high quality care today.  You may receive a patient satisfaction survey over the next few weeks. We would appreciate your feedback and comments so that we may continue to improve ourselves and the health of our patients.

## 2022-10-25 ENCOUNTER — Telehealth: Payer: Self-pay | Admitting: Adult Health

## 2022-10-25 DIAGNOSIS — R41841 Cognitive communication deficit: Secondary | ICD-10-CM

## 2022-10-25 DIAGNOSIS — F0670 Mild neurocognitive disorder due to known physiological condition without behavioral disturbance: Secondary | ICD-10-CM

## 2022-10-25 DIAGNOSIS — I639 Cerebral infarction, unspecified: Secondary | ICD-10-CM

## 2022-10-25 NOTE — Telephone Encounter (Signed)
Referral sent to [phone]

## 2022-11-01 NOTE — Telephone Encounter (Signed)
Patient called in to advise that [facility] is OON with her insurance. Requesting referral be sent somewhere in network.

## 2022-11-01 NOTE — Telephone Encounter (Signed)
Will send new referral to neuropsychologist Dr. Kieth Brightly.

## 2022-11-01 NOTE — Addendum Note (Signed)
Addended by: Ihor Austin L on: 11/01/2022 10:14 AM   Modules accepted: Orders

## 2022-11-08 NOTE — Telephone Encounter (Signed)
Please address.

## 2022-11-14 ENCOUNTER — Other Ambulatory Visit (HOSPITAL_BASED_OUTPATIENT_CLINIC_OR_DEPARTMENT_OTHER): Payer: Self-pay

## 2022-11-14 ENCOUNTER — Telehealth: Payer: BLUE CROSS/BLUE SHIELD | Admitting: Physician Assistant

## 2022-11-14 ENCOUNTER — Encounter: Payer: Self-pay | Admitting: Psychology

## 2022-11-14 DIAGNOSIS — Z207 Contact with and (suspected) exposure to pediculosis, acariasis and other infestations: Secondary | ICD-10-CM | POA: Diagnosis not present

## 2022-11-14 MED ORDER — PERMETHRIN 5 % EX CREA
1.0000 | TOPICAL_CREAM | Freq: Once | CUTANEOUS | 0 refills | Status: AC
  Filled 2022-11-14: qty 60, 1d supply, fill #0

## 2022-11-14 NOTE — Progress Notes (Signed)
E-Visit for Scabies  We are sorry that you are not feeling well. Here is how we plan to help!  Based on what you shared with me it looks like you have scabies.  Scabies is an infection caused by very tiny mites that burrow into the skin.  They cause severe itching. Though children are most commonly infected, anyone can get scabies.  Scabies mites can pass from person to person through physical close contact.  They can also be passed through shared clothing, towels, and bedding.  Scabies infection is not usually dangerous, but it is uncomfortable.  Because it is so contagious, scabies should be treated immediately to keep the infection from spreading.  If you have children in day care, please have any exposed children examined by their pediatrician. .   I have prescribed Permethrin topical cream 5% thoroughly massage cream from head to soles of feet; leave on for 8 to 14 hours before removing (shower or bath). May repeat if living mites are observed 14 days after first treatment; one application is generally curative.    HOME CARE:  You should treat all members in your household whether they show symptoms or not. Wash towels, clothes, bed linens, cloth toys, hats, and other personal items in hot water and dry on high heat. Vacuum floors and furniture and throw away the bag afterwards. Keep your child home from day care or school until the morning after treatment for scabies. Notify your child's day care or school so that other children can be checked and treated.  GET HELP RIGHT AWAY IF:  The infected person has a fever, red streaks, pain, or swelling of the skin. Sores get worse or don't heal. New rashes appear or itching continues for more than 2 weeks after treatment.  MAKE SURE YOU:  Understand these instructions. Will watch your condition. Will get help right away if you are not doing well or get worse.  Thank you for choosing an e-visit.  Your e-visit answers were reviewed by a  board certified advanced clinical practitioner to complete your personal care plan. Depending upon the condition, your plan could have included both over the counter or prescription medications.  Please review your pharmacy choice. Make sure the pharmacy is open so you can pick up prescription now. If there is a problem, you may contact your provider through MyChart messaging and have the prescription routed to another pharmacy.  Your safety is important to us. If you have drug allergies check your prescription carefully.   For the next 24 hours you can use MyChart to ask questions about today's visit, request a non-urgent call back, or ask for a work or school excuse. You will get an email in the next two days asking about your experience. I hope that your e-visit has been valuable and will speed your recovery.  

## 2022-11-14 NOTE — Progress Notes (Signed)
I have spent 5 minutes in review of e-visit questionnaire, review and updating patient chart, medical decision making and response to patient.   William Cody Martin, PA-C    

## 2022-11-14 NOTE — Progress Notes (Signed)
Message sent to patient requesting further input regarding current symptoms. Awaiting patient response.  

## 2022-12-15 ENCOUNTER — Ambulatory Visit (HOSPITAL_BASED_OUTPATIENT_CLINIC_OR_DEPARTMENT_OTHER): Payer: BLUE CROSS/BLUE SHIELD | Admitting: Family Medicine

## 2022-12-18 ENCOUNTER — Other Ambulatory Visit: Payer: Self-pay | Admitting: Internal Medicine

## 2022-12-18 DIAGNOSIS — F330 Major depressive disorder, recurrent, mild: Secondary | ICD-10-CM

## 2023-01-12 ENCOUNTER — Other Ambulatory Visit (HOSPITAL_BASED_OUTPATIENT_CLINIC_OR_DEPARTMENT_OTHER): Payer: Self-pay

## 2023-01-12 ENCOUNTER — Telehealth: Payer: BLUE CROSS/BLUE SHIELD | Admitting: Physician Assistant

## 2023-01-12 DIAGNOSIS — J020 Streptococcal pharyngitis: Secondary | ICD-10-CM

## 2023-01-12 MED ORDER — AMOXICILLIN 500 MG PO CAPS
500.0000 mg | ORAL_CAPSULE | Freq: Two times a day (BID) | ORAL | 0 refills | Status: AC
Start: 2023-01-12 — End: 2023-01-22
  Filled 2023-01-12: qty 20, 10d supply, fill #0

## 2023-01-12 NOTE — Progress Notes (Signed)

## 2023-01-15 ENCOUNTER — Ambulatory Visit (HOSPITAL_BASED_OUTPATIENT_CLINIC_OR_DEPARTMENT_OTHER): Payer: BLUE CROSS/BLUE SHIELD | Admitting: Family Medicine

## 2023-01-25 ENCOUNTER — Ambulatory Visit (HOSPITAL_BASED_OUTPATIENT_CLINIC_OR_DEPARTMENT_OTHER): Payer: BLUE CROSS/BLUE SHIELD | Admitting: Family Medicine

## 2023-01-25 ENCOUNTER — Encounter (HOSPITAL_BASED_OUTPATIENT_CLINIC_OR_DEPARTMENT_OTHER): Payer: Self-pay | Admitting: Family Medicine

## 2023-02-16 ENCOUNTER — Other Ambulatory Visit (HOSPITAL_BASED_OUTPATIENT_CLINIC_OR_DEPARTMENT_OTHER): Payer: Self-pay | Admitting: Cardiovascular Disease

## 2023-02-20 ENCOUNTER — Other Ambulatory Visit: Payer: Self-pay | Admitting: *Deleted

## 2023-02-20 MED ORDER — LISINOPRIL 20 MG PO TABS: 30.0000 mg | ORAL_TABLET | Freq: Every day | ORAL | 1 refills | Status: DC

## 2023-03-05 ENCOUNTER — Ambulatory Visit: Payer: No Typology Code available for payment source | Admitting: Internal Medicine

## 2023-03-05 ENCOUNTER — Ambulatory Visit (INDEPENDENT_AMBULATORY_CARE_PROVIDER_SITE_OTHER): Payer: No Typology Code available for payment source

## 2023-03-05 ENCOUNTER — Encounter: Payer: Self-pay | Admitting: Internal Medicine

## 2023-03-05 ENCOUNTER — Ambulatory Visit: Payer: No Typology Code available for payment source | Admitting: Family Medicine

## 2023-03-05 VITALS — BP 126/76 | HR 67 | Temp 97.9°F | Ht 66.0 in | Wt 213.2 lb

## 2023-03-05 DIAGNOSIS — M25561 Pain in right knee: Secondary | ICD-10-CM

## 2023-03-05 DIAGNOSIS — I10 Essential (primary) hypertension: Secondary | ICD-10-CM | POA: Diagnosis not present

## 2023-03-05 DIAGNOSIS — Z23 Encounter for immunization: Secondary | ICD-10-CM | POA: Diagnosis not present

## 2023-03-05 DIAGNOSIS — E559 Vitamin D deficiency, unspecified: Secondary | ICD-10-CM | POA: Diagnosis not present

## 2023-03-05 DIAGNOSIS — Z1211 Encounter for screening for malignant neoplasm of colon: Secondary | ICD-10-CM | POA: Insufficient documentation

## 2023-03-05 DIAGNOSIS — Z1159 Encounter for screening for other viral diseases: Secondary | ICD-10-CM | POA: Insufficient documentation

## 2023-03-05 LAB — CBC WITH DIFFERENTIAL/PLATELET
Basophils Absolute: 0 10*3/uL (ref 0.0–0.1)
Basophils Relative: 0.9 % (ref 0.0–3.0)
Eosinophils Absolute: 0.1 10*3/uL (ref 0.0–0.7)
Eosinophils Relative: 1.1 % (ref 0.0–5.0)
HCT: 41.4 % (ref 36.0–46.0)
Hemoglobin: 13.6 g/dL (ref 12.0–15.0)
Lymphocytes Relative: 38.1 % (ref 12.0–46.0)
Lymphs Abs: 2.1 10*3/uL (ref 0.7–4.0)
MCHC: 32.9 g/dL (ref 30.0–36.0)
MCV: 89.5 fL (ref 78.0–100.0)
Monocytes Absolute: 0.5 10*3/uL (ref 0.1–1.0)
Monocytes Relative: 9.5 % (ref 3.0–12.0)
Neutro Abs: 2.8 10*3/uL (ref 1.4–7.7)
Neutrophils Relative %: 50.4 % (ref 43.0–77.0)
Platelets: 244 10*3/uL (ref 150.0–400.0)
RBC: 4.62 Mil/uL (ref 3.87–5.11)
RDW: 13.6 % (ref 11.5–15.5)
WBC: 5.5 10*3/uL (ref 4.0–10.5)

## 2023-03-05 LAB — BASIC METABOLIC PANEL
BUN: 12 mg/dL (ref 6–23)
CO2: 25 meq/L (ref 19–32)
Calcium: 8.6 mg/dL (ref 8.4–10.5)
Chloride: 106 meq/L (ref 96–112)
Creatinine, Ser: 0.97 mg/dL (ref 0.40–1.20)
GFR: 70.78 mL/min (ref >60.00)
Glucose, Bld: 98 mg/dL (ref 70–99)
Potassium: 4 meq/L (ref 3.5–5.1)
Sodium: 138 meq/L (ref 135–145)

## 2023-03-05 NOTE — Progress Notes (Unsigned)
Subjective:  Patient ID: Tracey [name], female    DOB: [birth year]  Age: 47 y.o. MRN: [id number]  CC: Knee Pain   HPI Tracey [name] presents for f/up ----  Discussed the use of AI scribe software for clinical note transcription with the patient, who gave verbal consent to proceed.  History of Present Illness   The patient presents with a two-week history of right knee pain, which started gradually and has been progressively worsening. The pain is described as radiating, with a sensation of instability, popping, and clicking. The patient denies any known injury or trauma to the knee but admits to the possibility of an unnoticed injury due to forgetfulness. The patient has been managing the pain with over-the-counter ibuprofen, taking two pills as needed.  The patient denies any other health issues, including headaches, blood pressure problems, or respiratory symptoms. Tracey [name] is currently on blood pressure medication, which Tracey [name] reports is well-tolerated with no side effects. The patient also has a Mirena IUD and denies any chance of pregnancy.  The patient's medical history includes a previous knee issue, the details of which are unclear. The patient also received a flu vaccine during the visit.       Outpatient Medications Prior to Visit  Medication Sig Dispense Refill   aspirin 81 MG EC tablet Take 81 mg by mouth daily. Swallow whole.     levonorgestrel (MIRENA, 52 MG,) 20 MCG/24HR IUD 1 Intra Uterine Device (1 each total) by Intrauterine route once for 1 dose. 1 each 0   lisinopril (ZESTRIL) 20 MG tablet Take 1.5 tablets (30 mg total) by mouth daily. 135 tablet 1   Multiple Vitamins-Minerals (MULTIVITAMIN WITH MINERALS) tablet Take 1 tablet by mouth daily.     Probiotic Product (PROBIOTIC DAILY PO) Take 1 tablet by mouth daily.      rosuvastatin (CRESTOR) 10 MG tablet Take 1 tablet (10 mg total) by mouth daily. 90 tablet 3   sertraline (ZOLOFT) 50 MG tablet Take 1 tablet by mouth daily.  90 tablet 0   traZODone (DESYREL) 50 MG tablet Take 0.5 tablets (25 mg total) by mouth at bedtime. 15 tablet 5   Vitamin D, Ergocalciferol, (DRISDOL) 1.25 MG (50000 UNIT) CAPS capsule Take 1 capsule (50,000 Units total) by mouth every 7 (seven) days. (Patient not taking: Reported on 03/05/2023) 12 capsule 3   Fluocinolone Acetonide Scalp (DERMA-SMOOTHE/FS SCALP) 0.01 % OIL Apply to affected scalp 3-4 times per week. Do not apply to face. 118.28 mL 1   fluticasone (FLONASE) 50 MCG/ACT nasal spray Place 2 sprays into both nostrils daily. 16 g 0   No facility-administered medications prior to visit.    ROS Review of Systems  Constitutional: Negative.  Negative for chills, diaphoresis and fatigue.  HENT: Negative.    Eyes: Negative.   Respiratory:  Negative for chest tightness, shortness of breath and wheezing.   Cardiovascular:  Negative for chest pain, palpitations and leg swelling.  Gastrointestinal:  Negative for abdominal pain, constipation, diarrhea, nausea and vomiting.  Endocrine: Negative.   Genitourinary: Negative.  Negative for difficulty urinating.  Musculoskeletal:  Positive for arthralgias. Negative for joint swelling.  Neurological: Negative.  Negative for weakness.  Hematological:  Negative for adenopathy. Does not bruise/bleed easily.  Psychiatric/Behavioral: Negative.      Objective:  BP 126/76 (BP Location: Left Arm, Patient Position: Sitting, Cuff Size: Normal)   Pulse 67   Temp 97.9 F (36.6 C) (Oral)   Ht 5\' 6"  (1.676 m)   Wt  213 lb 3.2 oz (96.7 kg)   SpO2 98%   BMI 34.41 kg/m   BP Readings from Last 3 Encounters:  03/05/23 126/76  10/24/22 127/78  08/03/22 114/78    Wt Readings from Last 3 Encounters:  03/05/23 213 lb 3.2 oz (96.7 kg)  10/24/22 194 lb (88 kg)  08/03/22 194 lb 6.4 oz (88.2 kg)    Physical Exam Vitals reviewed.  HENT:     Mouth/Throat:     Mouth: Mucous membranes are moist.  Eyes:     General: No scleral icterus.     Conjunctiva/sclera: Conjunctivae normal.  Cardiovascular:     Rate and Rhythm: Normal rate and regular rhythm.     Heart sounds: No murmur heard.    No gallop.  Pulmonary:     Effort: Pulmonary effort is normal.     Breath sounds: No stridor. No wheezing, rhonchi or rales.  Abdominal:     General: Abdomen is flat.     Palpations: There is no mass.     Tenderness: There is no abdominal tenderness. There is no guarding.     Hernia: No hernia is present.  Musculoskeletal:     Cervical back: Neck supple.     Right knee: Bony tenderness and crepitus present. No swelling, deformity, effusion, erythema or ecchymosis. Normal range of motion. No tenderness. Normal alignment.     Left knee: Normal.     Right lower leg: No edema.     Left lower leg: No edema.  Lymphadenopathy:     Cervical: No cervical adenopathy.  Skin:    General: Skin is warm and dry.  Neurological:     General: No focal deficit present.     Mental Status: Tracey [name] is alert. Mental status is at baseline.  Psychiatric:        Mood and Affect: Mood normal.        Behavior: Behavior normal.     Lab Results  Component Value Date   WBC 5.5 03/05/2023   HGB 13.6 03/05/2023   HCT 41.4 03/05/2023   PLT 244.0 03/05/2023   GLUCOSE 98 03/05/2023   CHOL 122 07/04/2022   TRIG 67.0 07/04/2022   HDL 35.20 (L) 07/04/2022   LDLCALC 74 07/04/2022   ALT 24 06/12/2022   AST 18 06/12/2022   NA 138 03/05/2023   K 4.0 03/05/2023   CL 106 03/05/2023   CREATININE 0.97 03/05/2023   BUN 12 03/05/2023   CO2 25 03/05/2023   TSH 1.13 07/04/2022   INR 0.9 04/02/2020   HGBA1C 5.5 07/21/2020    MM 3D SCREENING MAMMOGRAM BILATERAL BREAST Result Date: 10/04/2022 CLINICAL DATA:  Screening. EXAM: DIGITAL SCREENING BILATERAL MAMMOGRAM WITH TOMOSYNTHESIS AND CAD TECHNIQUE: Bilateral screening digital craniocaudal and mediolateral oblique mammograms were obtained. Bilateral screening digital breast tomosynthesis was performed. The images were  evaluated with computer-aided detection. COMPARISON:  Previous exam(s). ACR Breast Density Category d: The breasts are extremely dense, which lowers the sensitivity of mammography. FINDINGS: There are no findings suspicious for malignancy. IMPRESSION: No mammographic evidence of malignancy. A result letter of this screening mammogram will be mailed directly to the patient. RECOMMENDATION: Screening mammogram in one year. (Code:SM-B-01Y) BI-RADS CATEGORY  1: Negative. Electronically Signed   By: Edwin Cap M.D.   On: 10/04/2022 14:23   DG Knee Complete 4 Views Right Result Date: 03/05/2023 CLINICAL DATA:  Pain and swelling RIGHT knee EXAM: RIGHT KNEE - COMPLETE 4+ VIEW COMPARISON:  None Available. FINDINGS: No fracture of the proximal  tibia or distal femur. Patella is normal. No joint effusion. IMPRESSION: No fracture or dislocation. Electronically Signed   By: Genevive Bi M.D.   On: 03/05/2023 15:56     Assessment & Plan:   Acute pain of right knee - I think Tracey [name] has a mensicus injury. -     DG Knee Complete 4 Views Right; Future -     Ambulatory referral to Sports Medicine  Primary hypertension- Her BP is well controlled. -     Basic metabolic panel; Future -     CBC with Differential/Platelet; Future  Need for hepatitis C screening test -     Hepatitis C antibody; Future  Need for immunization against influenza -     Flu vaccine trivalent PF, 6mos and older(Flulaval,Afluria,Fluarix,Fluzone)     Follow-up: Return in about 6 months (around 09/03/2023).  Sanda Linger, MD

## 2023-03-05 NOTE — Patient Instructions (Signed)

## 2023-03-06 ENCOUNTER — Other Ambulatory Visit (HOSPITAL_BASED_OUTPATIENT_CLINIC_OR_DEPARTMENT_OTHER): Payer: Self-pay | Admitting: Nurse Practitioner

## 2023-03-06 DIAGNOSIS — E559 Vitamin D deficiency, unspecified: Secondary | ICD-10-CM

## 2023-03-06 LAB — HEPATITIS C ANTIBODY: Hepatitis C Ab: NONREACTIVE

## 2023-03-18 ENCOUNTER — Other Ambulatory Visit: Payer: Self-pay | Admitting: Internal Medicine

## 2023-03-18 DIAGNOSIS — F330 Major depressive disorder, recurrent, mild: Secondary | ICD-10-CM

## 2023-03-20 ENCOUNTER — Other Ambulatory Visit (HOSPITAL_BASED_OUTPATIENT_CLINIC_OR_DEPARTMENT_OTHER): Payer: Self-pay

## 2023-03-20 ENCOUNTER — Encounter (HOSPITAL_BASED_OUTPATIENT_CLINIC_OR_DEPARTMENT_OTHER): Payer: Self-pay

## 2023-03-22 ENCOUNTER — Ambulatory Visit: Payer: No Typology Code available for payment source | Admitting: Family Medicine

## 2023-03-22 NOTE — Progress Notes (Deleted)
   LILLETTE Ileana Collet, PhD, LAT, ATC acting as a scribe for Artist Lloyd, MD.  Tracey [name] is a 48 y.o. female who presents to [facility] at [facility] today for R knee pain ongoing since early December. Insidious onset of pain. Pt locates pain to ***  R Knee swelling: Mechanical symptoms: Radiates: Aggravates: Treatments tried:  Dx imaging: 03/05/23 R knee XR  Pertinent review of systems: ***  Relevant historical information: ***   Exam:  There were no vitals taken for this visit. General: Well Developed, well nourished, and in no acute distress.   MSK: ***    Lab and Radiology Results No results found for this or any previous visit (from the past 72 hours). No results found.     Assessment and Plan: 48 y.o. female with ***   PDMP not reviewed this encounter. No orders of the defined types were placed in this encounter.  No orders of the defined types were placed in this encounter.    Discussed warning signs or symptoms. Please see discharge instructions. Patient expresses understanding.   ***

## 2023-03-29 ENCOUNTER — Ambulatory Visit: Payer: No Typology Code available for payment source | Admitting: Family Medicine

## 2023-03-29 NOTE — Progress Notes (Deleted)
   LILLETTE Ileana Collet, PhD, LAT, ATC acting as a scribe for Artist Lloyd, MD.  Tracey [name] is a 48 y.o. female who presents to [facility] at [facility] today for R knee pain ongoing since early December. Insidious onset of pain. Pt locates pain to ***  R Knee swelling: Mechanical symptoms: Radiates: Aggravates: Treatments tried:  Dx imaging: 03/05/23 R knee XR  Pertinent review of systems: ***  Relevant historical information: ***   Exam:  There were no vitals taken for this visit. General: Well Developed, well nourished, and in no acute distress.   MSK: ***    Lab and Radiology Results No results found for this or any previous visit (from the past 72 hours). No results found.     Assessment and Plan: 48 y.o. female with ***   PDMP not reviewed this encounter. No orders of the defined types were placed in this encounter.  No orders of the defined types were placed in this encounter.    Discussed warning signs or symptoms. Please see discharge instructions. Patient expresses understanding.   ***

## 2023-04-02 ENCOUNTER — Telehealth: Payer: Self-pay | Admitting: Adult Health

## 2023-04-02 NOTE — Telephone Encounter (Signed)
 Received fax from [facility], patient is scheduled to see Dr. Authur on the following dates: Intake/Testing: June 04, 2023 at 1:00 PM Interview/Feedback: June 20, 2023 at 1:00 PM.  They are needing last 3 clinic notes, CT/MRI brain results (if available), H&P, Medications. Will fax the documents to [phone]

## 2023-04-04 ENCOUNTER — Ambulatory Visit: Payer: No Typology Code available for payment source | Admitting: Family Medicine

## 2023-04-04 ENCOUNTER — Encounter: Payer: Self-pay | Admitting: Family Medicine

## 2023-04-04 ENCOUNTER — Other Ambulatory Visit: Payer: Self-pay

## 2023-04-04 VITALS — BP 144/86 | HR 71 | Ht 66.0 in | Wt 209.0 lb

## 2023-04-04 DIAGNOSIS — M25561 Pain in right knee: Secondary | ICD-10-CM | POA: Diagnosis not present

## 2023-04-04 DIAGNOSIS — G8929 Other chronic pain: Secondary | ICD-10-CM | POA: Diagnosis not present

## 2023-04-04 NOTE — Progress Notes (Signed)
   I, Stevenson Clinch, CMA acting as a scribe for Clementeen Graham, MD.  Tracey [name] is a 48 y.o. female who presents to [facility] at [facility] today for R knee pain ongoing since early December. Insidious onset of pain. Pt locates pain to medial and lateral aspect Swelling present. C/O non-painful popping, feels unsteady at times.   R Knee swelling: yes Mechanical symptoms: yes Radiates: lower leg Aggravates: flexion, activity Treatments tried: rest  Dx imaging: 03/05/23 R knee XR  Pertinent review of systems: No fevers or chills  Relevant historical information: Percutaneous patent foramen ovale closure history. Her son has a history of patellar dislocation requiring surgery.  Exam:  BP (!) 144/86   Pulse 71   Ht 5\' 6"  (1.676 m)   Wt 209 lb (94.8 kg)   SpO2 98%   BMI 33.73 kg/m  General: Well Developed, well nourished, and in no acute distress.   MSK: Right knee: Normal-appearing Normal motion. Stable ligamentous exam. Intact strength. Negative patellar apprehension test.   Lab and Radiology Results  Diagnostic Limited MSK Ultrasound of: Right knee Quad tendon intact normal-appearing Patellar tendon is intact and normal-appearing. Medial joint line narrowed and degenerative appearing. Lateral joint line normal appearing.   Posterior knee no Baker's cyst. Impression: Largely normal-appearing MSK ultrasound of the knee with mild medial degeneration   EXAM: RIGHT KNEE - COMPLETE 4+ VIEW   COMPARISON:  None Available.   FINDINGS: No fracture of the proximal tibia or distal femur. Patella is normal. No joint effusion.   IMPRESSION: No fracture or dislocation.     Electronically Signed   By: Genevive Bi M.D.   On: 03/05/2023 15:56 I, Clementeen Graham, personally (independently) visualized and performed the interpretation of the images attached in this note.    Assessment and Plan: 48 y.o. 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  Plan for trial of physical therapy Voltaren gel and acetaminophen.  Recheck in 8 weeks.  PDMP not reviewed this encounter. Orders Placed This Encounter  Procedures   [place] LIMITED JOINT SPACE STRUCTURES LOW RIGHT(NO LINKED CHARGES)    Reason for Exam (SYMPTOM  OR DIAGNOSIS REQUIRED):   right knee pain    Preferred imaging location?:    [facility] referral to Physical Therapy    Referral Priority:   Routine    Referral Type:   Physical Medicine    Referral Reason:   Specialty Services Required    Requested Specialty:   Physical Therapy    Number of Visits Requested:   1   No orders of the defined types were placed in this encounter.    Discussed warning signs or symptoms. Please see discharge instructions. Patient expresses understanding.   The above documentation has been reviewed and is accurate and complete Clementeen Graham, M.D.

## 2023-04-04 NOTE — Patient Instructions (Addendum)
 Thank you for coming in today.   Please use Voltaren gel (Generic Diclofenac Gel) up to 4x daily for pain as needed.  This is available over-the-counter as both the name brand Voltaren gel and the generic diclofenac gel.   I've referred you to Physical Therapy.  Let us  know if you don't hear from them in one week.   Check back in 8 weeks

## 2023-04-09 ENCOUNTER — Other Ambulatory Visit (HOSPITAL_BASED_OUTPATIENT_CLINIC_OR_DEPARTMENT_OTHER): Payer: Self-pay

## 2023-04-09 MED ORDER — SERTRALINE HCL 100 MG PO TABS
100.0000 mg | ORAL_TABLET | Freq: Every day | ORAL | 1 refills | Status: DC
  Filled 2023-04-09: qty 30, 30d supply, fill #0
  Filled 2023-05-15: qty 30, 30d supply, fill #1

## 2023-04-17 ENCOUNTER — Telehealth: Payer: Self-pay | Admitting: Adult Health

## 2023-04-17 NOTE — Telephone Encounter (Signed)
LVM and sent mychart msg informing pt of need to reschedue 04/30/23 appt - NP out

## 2023-04-17 NOTE — Telephone Encounter (Signed)
Pt called to r/s appointment

## 2023-04-30 ENCOUNTER — Ambulatory Visit: Payer: BC Managed Care – PPO | Admitting: Adult Health

## 2023-05-01 ENCOUNTER — Other Ambulatory Visit (HOSPITAL_BASED_OUTPATIENT_CLINIC_OR_DEPARTMENT_OTHER): Payer: Self-pay

## 2023-05-01 ENCOUNTER — Ambulatory Visit (INDEPENDENT_AMBULATORY_CARE_PROVIDER_SITE_OTHER): Payer: BC Managed Care – PPO | Admitting: Adult Health

## 2023-05-01 ENCOUNTER — Encounter: Payer: Self-pay | Admitting: Adult Health

## 2023-05-01 VITALS — BP 144/84 | HR 67 | Ht 67.0 in | Wt 220.0 lb

## 2023-05-01 DIAGNOSIS — I639 Cerebral infarction, unspecified: Secondary | ICD-10-CM

## 2023-05-01 DIAGNOSIS — I999 Unspecified disorder of circulatory system: Secondary | ICD-10-CM | POA: Diagnosis not present

## 2023-05-01 DIAGNOSIS — G4700 Insomnia, unspecified: Secondary | ICD-10-CM

## 2023-05-01 DIAGNOSIS — F0670 Mild neurocognitive disorder due to known physiological condition without behavioral disturbance: Secondary | ICD-10-CM | POA: Diagnosis not present

## 2023-05-01 MED ORDER — TRAZODONE HCL 50 MG PO TABS
50.0000 mg | ORAL_TABLET | Freq: Every day | ORAL | 11 refills | Status: AC
  Filled 2023-05-01 – 2023-05-15 (×2): qty 45, 30d supply, fill #0
  Filled 2023-11-01: qty 45, 30d supply, fill #1

## 2023-05-01 NOTE — Progress Notes (Signed)
[facility] [address] [place]. [place] [address] [phone]       STROKE FOLLOW UP NOTE  [name] Date of Birth:  09/17/1975 Medical Record Number:  [id number]   Reason for visit: Memory concerns  SUBJECTIVE:   Chief Complaint  Patient presents with   Results    Pt in 8 with daughter Pt here for stroke and memory f/u Pt states  short term memory is a little worse       HPI:   Update 05/01/2023 JM: Patient is being seen for 46-month follow-up.  Overall cognition has been stable since prior visit.  She continues to work at [employer], can have good days and bad days.  More issue with short-term memory and executive functioning.  She is scheduled in March with [facility] for neurocognitive evaluation.  She does have excessive daytime fatigue which can make her cognition worse.  Continues to have insomnia, trazodone has helped some but typically only gets about 4 hours of consistent sleep.  No new stroke/TIA symptoms.  Remains on aspirin and Crestor.  Routinely follows with PCP for stroke risk factor management.     History provided for reference purposes only Update 10/24/2022 JM: Patient returns for follow-up visit accompanied by her daughter.  She was seen by Dr. Vickey [name] on 08/03/2022 for concerns of memory loss.  Previously discussed memory loss concerns with patient which has been present since her stroke and has also previously discussed this at prior visit with Dr. Vickey [name] who recommended cognitive testing and consideration of Aricept and treating insomnia with Lunesta. At recent visit with Dr. Vickey [name], MMSE 25/30, she was advised to follow up with Dr. Pearlean [name] for cognitive impairment concerns as this is likely result from her prior stroke.  Reports upon previously seen Dr. Vickey [name], she was recently promoted at [employer], having difficulty learning new role and tasks that needed to be done, this has since improved and doing  well with current job. She has noticed greater difficulty retaining new information learned or can take more time to process. She also has difficulty with concentration, needs to focus on one thing at a time. Also difficulty with short term memory. Does not believe this has necessarily worsened since her stroke, possibly just more noticeable.  She does have a 57-year-old daughter who is present at visit today, also adopted a baby girl back in 12/2021 who is currently 27-year-old, she also has a 46 year old and 46 year old.  She does continue to have sleep difficulty and notes significantly poor sleep, prior difficulty tolerating Lunesta due to grogginess feeling.  She has been working on Tracey [name] loss, deviously on semaglutide compound, reports taking for 4 months with 50 lbs Tracey [name] loss. She stopped this about 2 months ago as she felt she was losing too much Tracey [name] too quickly.   Denies any new stroke/TIA symptoms.  Remains on aspirin and Crestor.  Blood pressure well-controlled.  Routinely follows with PCP for stroke risk factor management.  Update 04/05/2021 JM: patient being seen for 5 month stroke follow up via MyChart VV per pt request. Overall stable from stroke standpoint without new stroke/TIA symptoms.  Residual occasional word finding difficulty and occassional short term memory difficulties stable.  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 C/o worsening vision since stroke - difficulty seeing further away.  Compliant on aspirin and Crestor without side effects. C/o worsening  insomnia over the past 1.5 months. Will fall asleep with TV on. Will wake up around 1am and have difficulty falling back asleep. On Lunesta 2mg  nightly but difficulty tolerating due to morning grogginess.  No further concerns at this time.  Update 11/03/2020 JM: [name] is being seen for 3 month stroke follow up  unaccompanied. Overall stable.  Speech has been greatly improving since prior visit.  Occasional word finding difficulty more so when speaking to those she is not familiar with or with increased anxiety.  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  Management has been very supportive and has been slowly relearning prior job duties and training as needed.  Also reports fluctuation of left hand fingertips and tips of toes on left foot numbness especially in the evening while relaxing - present since stroke onset - not new.  Recently started on sertraline for anxiety worsened post stroke.  denies new stroke/TIA symptoms. Compliant on aspirin, Plavix and Crestor-denies associated side effects.  Blood pressure today 139/88.  Being followed by healthy Tracey [name] and wellness with a 5 pound Tracey [name] loss since May.  Followed by Dr. Vickey [name] for insomnia on Lunesta with benefit.  No further concerns at this time.  Update 08/02/2020 JM: [name] returns for 79-month stroke follow-up accompanied by her 60 yo daughter  Doing well since prior visit from stroke standpoint without new stroke/TIA symptoms.  Residual cognitive impairment gradually improving with greatest difficulty with recall or getting words together especially when talking to somebody she does not know or with increased anxiety (per SLP difficulties with thought organization and managing some day-to-day tasks).  She continues to work routinely with neuro rehab SLP.  She remains on short term disability and is eager to return back to work at [employer].   Reports compliance on aspirin, plavix (post PFO closure) and Crestor without associated side effects Blood pressure today 135/83  S/p PFO closure 06/16/2020 without complication Sleep study 06/13/2020 negative for sleep apnea (although only captured 3.5 hours of sleep however all sleep stages were captured) -chronic history of  insomnia and migraines recently started on Lunesta and Ubrelvy per PCP with benefit.  Also on bupropion for depression/anxiety.  No further concerns at this time  Initial visit 05/03/2020 JM: Mrs. Espey is being seen for hospital follow-up accompanied by her husband.  She reports gradual improvement since discharge with residual LLE weakness (feels like knee wants to hyperextend), gait impairment and cognitive impairment.  Currently working with PT and has initial evaluation with SLP tomorrow for cognitive therapy.  She has not returned back to work currently on short-term disability working for [name].  Baseline depression/anxiety with worsening anxiety and depression but also self discontinued bupropion at hospital discharge as she was fearful of possible increased stroke risk with continued use.  Reports increased fatigue since her stroke with fatigue PTA as well as snoring, witnessed apnea and insomnia.  She has not previously underwent sleep study.  Denies new stroke/TIA symptoms.  She has completed 3 weeks DAPT and remains on aspirin alone without bleeding or bruising.  Remains on Crestor 10 mg daily without myalgias.  Blood pressure today 149/77.  She does admit to smoking 1 cigarette/day with goal of complete tobacco cessation in the near future.  TEE completed 04/16/2020 which showed evidence of PFO.  Initial evaluation with Dr. Excell Seltzer for possible closure scheduled on 05/28/2020.  Hypercoagulable labs negative.  No further concerns  at this time.  Stroke admission 04/02/2020 Ms. Jackqulyn Switala is a 48 y.o. female with history of hypertension, anxiety (panic attacks), headaches (migraines), ongoing tobacco use, and depression (just started Wellbutrin on the day of admission)  who presented to the [facility] emergency department  on 04/02/2020 and seen by teleNeurology for acute onset neck discomfort, left leg numbness / weakness, left arm numbness and a pressure sensation at  the base of her posterior neck in the midline.   Personally reviewed hospitalization pertinent progress notes, lab work and imaging with summary provided.  Shortly transferred to [facility] evaluated by Dr. Pearlean [name] with stroke work-up revealing small acute infarct in the juxtacortical high right posterior frontal lobe, possibly embolic secondary to unknown source.  Recommend completion of TEE outpatient.  Hypercoagulable labs pending at discharge.  Recommended DAPT for 3 weeks and aspirin alone. LDL 101 and initiated Crestor 10 mg daily.  A1c 5.1.  Other stroke risk factors include current tobacco use, obesity and history of migraines but no prior stroke history.  Other active problems include suspected sleep apnea, bradycardia and hypokalemia.  Evaluated by therapies and recommended outpatient PT and discharged home in stable condition.  Stroke: Small acute infarct in the juxtacortical high right posterior frontal lobe possibly embolic - etiology unknown. Resultant mild left leg weakness and numbness which is resolving Code Stroke CT Head -  Normal head CT. ASPECTS is 10.     CT head - not ordered MRI head - Small acute infarct in the juxtacortical high right posterior frontal lobe. Minimal edema without mass effect. Additional mild scattered T2/FLAIR hyperintensities within the white matter, mildly advanced for age. These are nonspecific and could relate to chronic microvascular ischemic disease, prior migraines, trauma, demyelination, or inflammation. CTA H&N - normal 2D Echo - EF 55 - 60%. No cardiac source of emboli identified.  [employer] Virus 2 - negative LDL - 101 HgbA1c - 5.1 UDS - negative VTE prophylaxis - Lovenox No antithrombotic prior to admission, now on aspirin 81 mg daily and clopidogrel 75 mg daily for 3 weeks then aspirin alone Patient will be counseled to be compliant with her antithrombotic medications Ongoing aggressive stroke risk factor management Therapy recommendations:  Outpt  PT Disposition:  Home     ROS:   14 system review of systems performed and negative with exception of those listed in HPI  PMH:  Past Medical History:  Diagnosis Date   Anxiety    Bronchitis    Carpal tunnel syndrome on both sides    Constipation    Depression    Family history of adverse reaction to anesthesia    mother had problems waking up from surgery.   Gestational diabetes    Gestational diabetes mellitus    Normal 2 hr GTT postpartum   Headache    migraines   High cholesterol    Hypertension    Infertility, female    Lactose intolerance    Oligohydramnios antepartum 11/09/2017   Palpitations    Panic attack 2017   diagnosed 3 months ago. no meds presently   Reflux    did not filll prescription   S/P percutaneous patent foramen ovale closure 06/16/2020   s/p PFO occluder device with a a 25 MM AMPLATZER by Dr. Excell Seltzer   Stroke [facility])    Swelling    Vitamin D deficiency     PSH:  Past Surgical History:  Procedure Laterality Date   BREAST EXCISIONAL BIOPSY Right    cyst   BUBBLE  STUDY  04/16/2020   Procedure: BUBBLE STUDY;  Surgeon: Chilton Si, MD;  Location: [facility] ENDOSCOPY;  Service: Cardiovascular;;   DILATION AND EVACUATION N/A 07/26/2015   Procedure: DILATATION AND EVACUATION;  Surgeon: Brock Bad, MD;  Location: [facility] ORS;  Service: Gynecology;  Laterality: N/A;   PATENT FORAMEN OVALE(PFO) CLOSURE N/A 06/16/2020   Procedure: PATENT FORAMEN OVALE (PFO) CLOSURE;  Surgeon: Tonny Bollman, MD;  Location: [facility] INVASIVE CV LAB;  Service: Cardiovascular;  Laterality: N/A;   TEE WITHOUT CARDIOVERSION N/A 04/16/2020   Procedure: TRANSESOPHAGEAL ECHOCARDIOGRAM (TEE);  Surgeon: Chilton Si, MD;  Location: [facility] ENDOSCOPY;  Service: Cardiovascular;  Laterality: N/A;    Social History:  Social History   Socioeconomic History   Marital status: Married    Spouse name: Not on file   Number of children: Not on file   Years of education: Not on file    Highest education level: Some college, no degree  Occupational History   Occupation: Not Working  Tobacco Use   Smoking status: Every Day    Current packs/day: 0.25    Types: Cigarettes   Smokeless tobacco: Never   Tobacco comments:    4 cig/day  Vaping Use   Vaping status: Never Used  Substance and Sexual Activity   Alcohol use: Yes    Comment: wine occ   Drug use: No   Sexual activity: Yes    Birth control/protection: I.U.D.  Other Topics Concern   Not on file  Social History Narrative   Lives at home with spouse and her 2 children   Right handed   Caffeine: 2 cups/day   Pt works   Social Drivers of Corporate investment banker Strain: Low Risk  (07/04/2022)   Overall Financial Resource Strain (CARDIA)    Difficulty of Paying Living Expenses: Not hard at all  Food Insecurity: No Food Insecurity (07/04/2022)   Hunger Vital Sign    Worried About Running Out of Food in the Last Year: Never true    Ran Out of Food in the Last Year: Never true  Transportation Needs: No Transportation Needs (07/04/2022)   PRAPARE - Administrator, Civil Service (Medical): No    Lack of Transportation (Non-Medical): No  Physical Activity: Insufficiently Active (07/04/2022)   Exercise Vital Sign    Days of Exercise per Week: 3 days    Minutes of Exercise per Session: 30 min  Stress: Stress Concern Present (07/04/2022)   [employer] of Occupational Health - Occupational Stress Questionnaire    Feeling of Stress : Very much  Social Connections: Moderately Integrated (07/04/2022)   Social Connection and Isolation Panel [NHANES]    Frequency of Communication with Friends and Family: More than three times a week    Frequency of Social Gatherings with Friends and Family: Twice a week    Attends Religious Services: More than 4 times per year    Active Member of [employer] or Organizations: No    Attends Engineer, structural: Not on file    Marital Status: Married  Careers information officer Violence: Not on file    Family History:  Family History  Problem Relation Age of Onset   Hypertension Mother    Stroke Mother    Anxiety disorder Father    Depression Father    Alcoholism Father    Diabetes Maternal Aunt    Diabetes Maternal Uncle    Breast cancer Paternal Aunt    Stroke Maternal Grandmother     Medications:  Current Outpatient Medications on File Prior to Visit  Medication Sig Dispense Refill   aspirin 81 MG EC tablet Take 81 mg by mouth daily. Swallow whole.     levonorgestrel (MIRENA, 52 MG,) 20 MCG/24HR IUD 1 Intra Uterine Device (1 each total) by Intrauterine route once for 1 dose. 1 each 0   lisinopril (ZESTRIL) 20 MG tablet Take 1.5 tablets (30 mg total) by mouth daily. 135 tablet 1   Multiple Vitamins-Minerals (MULTIVITAMIN WITH MINERALS) tablet Take 1 tablet by mouth daily.     Probiotic Product (PROBIOTIC DAILY PO) Take 1 tablet by mouth daily.      rosuvastatin (CRESTOR) 10 MG tablet Take 1 tablet (10 mg total) by mouth daily. 90 tablet 3   sertraline (ZOLOFT) 100 MG tablet Take 1 tablet (100 mg total) by mouth daily. 30 tablet 1   sertraline (ZOLOFT) 50 MG tablet Take 1 tablet by mouth daily. 90 tablet 0   Vitamin D, Ergocalciferol, (DRISDOL) 1.25 MG (50000 UNIT) CAPS capsule Take 1 capsule (50,000 Units total) by mouth every 7 (seven) days. 12 capsule 3   No current facility-administered medications on file prior to visit.    Allergies:  No Known Allergies    OBJECTIVE:  Today's Vitals   05/01/23 1537  BP: (!) 144/84  Pulse: 67  Tracey [name]: 220 lb (99.8 kg)  Height: 5\' 7"  (1.702 m)   Body mass index is 34.46 kg/m.  Physical Exam General: well developed, well nourished, very pleasant middle-aged African-American female, seated, in no evident distress Head: head normocephalic and atraumatic.   Neck: supple with no carotid or supraclavicular bruits Cardiovascular: regular rate and rhythm, no murmurs Musculoskeletal: no  deformity Skin:  no rash/petichiae Vascular:  Normal pulses all extremities   Neurologic Exam Mental Status: Awake and fully alert.  Fluent speech and language during visit.  Oriented to place and time. Recent memory mildly impaired and remote memory intact. Attention span, concentration and fund of knowledge appropriate during visit. Mood and affect appropriate.  Cranial Nerves: Pupils equal, briskly reactive to light. Extraocular movements full without nystagmus. Visual fields full to confrontation. Hearing intact. Facial sensation intact. Face, tongue, palate moves normally and symmetrically.  Motor: Normal bulk and tone. Normal strength in all tested extremity muscles Sensory.: intact to touch , pinprick , position and vibratory sensation.  Coordination: Rapid alternating movements normal in all extremities. Finger-to-nose and heel-to-shin performed accurately bilaterally. Gait and Station: Arises from chair without difficulty. Stance is normal. Gait demonstrates normal stride length and balance without use of AD.  Reflexes: 1+ and symmetric. Toes downgoing.      05/01/2023    3:40 PM 08/03/2022    1:45 PM  MMSE - Mini Mental State Exam  Orientation to time 5 5  Orientation to Place 4 5  Registration 3 3  Attention/ Calculation 1 0  Recall 3 3  Language- name 2 objects 2 2  Language- repeat 1 1  Language- follow 3 step command 3 3  Language- read & follow direction 1 1  Write a sentence 1 1  Copy design 1 1  Total score 25 25         ASSESSMENT: Tracey [name] is a 48 y.o. year old female with small acute infarct in the juxtacortical high right posterior frontal lobe on 03/02/2021 possibly embolic secondary to unknown source although evidence of PFO. Vascular risk factors include HTN, HLD, tobacco use, obesity, migraines and PFO s/p closure 03/2020     PLAN:  R frontal lobe stroke, cryptogenic:  Residual deficit: Cognitive communication deficit.  PFO s/p closure  06/16/2020 Hypercoagulable labs and 30-day cardiac event monitor negative Continue aspirin 81 mg daily  and Crestor 10 mg daily for secondary stroke prevention managed/prescribed by PCP Discussed secondary stroke prevention measures and importance of close PCP follow up for aggressive stroke risk factor management including BP goal<130/90, and HLD with LDL goal<70  Mild cognitive impairment Likely post stroke and possibly insomnia contributing factor, scheduled with neuropsychology [facility] for neurocognitive testing 05/2023 Associated with short-term memory loss and executive functioning impairment Denies actual worsening since stroke.  Could consider use of Namenda if she notices any progression  Chronic insomnia:  Previously followed by Dr. Vickey [name].  Continued difficulty with insomnia nightly although some improvement on trazodone Recommend continued use of trazodone 1-1.5 tab nightly PRN    Follow-up will be determined after completion of neurocognitive testing   CC:  Etta Grandchild, MD    I spent 25 minutes of face-to-face and non-face-to-face time with patient.  This included previsit chart review, lab review, study review, electronic health record documentation, patient education and discussion regarding above diagnoses and treatment plan and answered all other questions to patient's satisfaction  Ihor Austin, [facility]  [facility] [address] [place], [place] [address]  Phone [phone] Fax [phone] Note: This document was prepared with digital dictation and possible smart phrase technology. Any transcriptional errors that result from this process are unintentional.

## 2023-05-01 NOTE — Patient Instructions (Signed)
Your Plan:  Continue trazodone - can increase to 1-1.5 tablets nightly as needed   Follow up with Dr. Rueben Bash as scheduled in March    We will plan on a follow up visit after you see Dr. Rueben Bash        Thank you for coming to see [place] at [facility]. I hope we have been able to provide you high quality care today.  You may receive a patient satisfaction survey over the next few weeks. We would appreciate your feedback and comments so that we may continue to improve ourselves and the health of our patients.

## 2023-05-05 ENCOUNTER — Telehealth: Payer: BC Managed Care – PPO | Admitting: Physician Assistant

## 2023-05-05 DIAGNOSIS — R6889 Other general symptoms and signs: Secondary | ICD-10-CM

## 2023-05-05 NOTE — Progress Notes (Signed)
 I have spent 5 minutes in review of e-visit questionnaire, review and updating patient chart, medical decision making and response to patient.   Laure Kidney, PA-C

## 2023-05-05 NOTE — Progress Notes (Signed)
 E visit for Flu like symptoms   We are sorry that you are not feeling well.  Here is how we plan to help! Based on what you have shared with me it looks like you may have flu-like symptoms that should be watched but do not seem to indicate anti-viral treatment.  Influenza or "the flu" is   an infection caused by a respiratory virus. The flu virus is highly contagious and persons who did not receive their yearly flu vaccination may "catch" the flu from close contact.  We have anti-viral medications to treat the viruses that cause this infection. They are not a "cure" and only shorten the course of the infection. These prescriptions are most effective when they are given within the first 2 days of "flu" symptoms. Antiviral medication are indicated if you have a high risk of complications from the flu. You should  also consider an antiviral medication if you are in close contact with someone who is at risk. These medications can help patients avoid complications from the flu  but have side effects that you should know. Possible side effects from Tamiflu or oseltamivir include nausea, vomiting, diarrhea, dizziness, headaches, eye redness, sleep problems or other respiratory symptoms. You should not take Tamiflu if you have an allergy to oseltamivir or any to the ingredients in Tamiflu.  Based upon your symptoms and potential risk factors I recommend that you follow the flu symptoms recommendation that I have listed below.  ANYONE WHO HAS FLU SYMPTOMS SHOULD: . Stay home. The flu is highly contagious and going out or to work exposes others! . Be sure to drink plenty of fluids. Water is fine as well as fruit juices, sodas and electrolyte beverages. You may want to stay away from caffeine or alcohol. If you are nauseated, try taking small sips of liquids. How do you know if you are getting enough fluid? Your urine should be a pale yellow or almost colorless. . Get rest. . Taking a steamy shower or using a  humidifier may help nasal congestion and ease sore throat pain. Using a saline nasal spray works much the same way. . Cough drops, hard candies and sore throat lozenges may ease your cough. . Line up a caregiver. Have someone check on you regularly.   GET HELP RIGHT AWAY IF: . You cannot keep down liquids or your medications. . You become short of breath . Your fell like you are going to pass out or loose consciousness. . Your symptoms persist after you have completed your treatment plan MAKE SURE YOU   Understand these instructions.  Will watch your condition.  Will get help right away if you are not doing well or get worse.  Your e-visit answers were reviewed by a board certified advanced clinical practitioner to complete your personal care plan.  Depending on the condition, your plan could have included both over the counter or prescription medications.  If there is a problem please reply  once you have received a response from your provider.  Your safety is important to [place].  If you have drug allergies check your prescription carefully.    You can use MyChart to ask questions about today's visit, request a non-urgent call back, or ask for a work or school excuse for 24 hours related to this e-Visit. If it has been greater than 24 hours you will need to follow up with your provider, or enter a new e-Visit to address those concerns.  You will get an e-mail  in the next two days asking about your experience.  I hope that your e-visit has been valuable and will speed your recovery. Thank you for using e-visits.

## 2023-05-11 ENCOUNTER — Other Ambulatory Visit (HOSPITAL_BASED_OUTPATIENT_CLINIC_OR_DEPARTMENT_OTHER): Payer: Self-pay

## 2023-05-11 ENCOUNTER — Ambulatory Visit: Payer: BC Managed Care – PPO | Admitting: Physical Therapy

## 2023-05-15 ENCOUNTER — Other Ambulatory Visit (HOSPITAL_BASED_OUTPATIENT_CLINIC_OR_DEPARTMENT_OTHER): Payer: Self-pay

## 2023-05-17 ENCOUNTER — Other Ambulatory Visit: Payer: Self-pay

## 2023-05-17 ENCOUNTER — Other Ambulatory Visit (HOSPITAL_BASED_OUTPATIENT_CLINIC_OR_DEPARTMENT_OTHER): Payer: Self-pay | Admitting: Family

## 2023-05-17 DIAGNOSIS — E7849 Other hyperlipidemia: Secondary | ICD-10-CM

## 2023-05-17 MED ORDER — ROSUVASTATIN CALCIUM 10 MG PO TABS: 10.0000 mg | ORAL_TABLET | Freq: Every day | ORAL | 0 refills | Status: DC

## 2023-05-29 ENCOUNTER — Other Ambulatory Visit (HOSPITAL_BASED_OUTPATIENT_CLINIC_OR_DEPARTMENT_OTHER): Payer: Self-pay

## 2023-05-29 MED ORDER — BUPROPION HCL ER (XL) 150 MG PO TB24
150.0000 mg | ORAL_TABLET | ORAL | 0 refills | Status: DC
Start: 2023-05-29 — End: 2023-06-22
  Filled 2023-05-29: qty 30, 30d supply, fill #0

## 2023-05-30 ENCOUNTER — Ambulatory Visit: Payer: BC Managed Care – PPO | Admitting: Family Medicine

## 2023-05-30 NOTE — Progress Notes (Deleted)
   Rubin Payor, PhD, LAT, ATC acting as a scribe for Clementeen Graham, MD.  Tracey [name] is a 48 y.o. female who presents to [facility] at [facility] today for f/u R knee pain. Pt was last seen by Dr. Denyse Amass on 04/04/23 and was advised to use Voltaren gel, Tylenol, and was referred to PT.  Today, pt reports ***  Dx imaging: 03/05/23 R knee XR   Pertinent review of systems: ***  Relevant historical information: ***   Exam:  There were no vitals taken for this visit. General: Well Developed, well nourished, and in no acute distress.   MSK: ***    Lab and Radiology Results No results found for this or any previous visit (from the past 72 hours). No results found.     Assessment and Plan: 48 y.o. female with ***   PDMP not reviewed this encounter. No orders of the defined types were placed in this encounter.  No orders of the defined types were placed in this encounter.    Discussed warning signs or symptoms. Please see discharge instructions. Patient expresses understanding.   ***

## 2023-05-31 ENCOUNTER — Encounter: Payer: BC Managed Care – PPO | Admitting: Internal Medicine

## 2023-06-04 ENCOUNTER — Other Ambulatory Visit (HOSPITAL_BASED_OUTPATIENT_CLINIC_OR_DEPARTMENT_OTHER): Payer: Self-pay

## 2023-06-06 ENCOUNTER — Encounter: Payer: Self-pay | Admitting: Family Medicine

## 2023-06-16 ENCOUNTER — Other Ambulatory Visit: Payer: Self-pay | Admitting: Internal Medicine

## 2023-06-16 DIAGNOSIS — F330 Major depressive disorder, recurrent, mild: Secondary | ICD-10-CM

## 2023-06-22 ENCOUNTER — Other Ambulatory Visit (HOSPITAL_BASED_OUTPATIENT_CLINIC_OR_DEPARTMENT_OTHER): Payer: Self-pay

## 2023-06-22 MED ORDER — BUPROPION HCL ER (XL) 150 MG PO TB24
150.0000 mg | ORAL_TABLET | Freq: Every day | ORAL | 0 refills | Status: AC
Start: 2023-06-22 — End: ?
  Filled 2023-06-22: qty 90, 90d supply, fill #0

## 2023-06-22 MED ORDER — SERTRALINE HCL 50 MG PO TABS
50.0000 mg | ORAL_TABLET | Freq: Every day | ORAL | 0 refills | Status: DC
Start: 2023-06-22 — End: 2023-10-01
  Filled 2023-06-22: qty 90, 90d supply, fill #0

## 2023-07-17 ENCOUNTER — Ambulatory Visit: Payer: BC Managed Care – PPO | Admitting: Adult Health

## 2023-07-25 ENCOUNTER — Encounter: Admitting: Internal Medicine

## 2023-07-25 ENCOUNTER — Encounter

## 2023-08-15 ENCOUNTER — Other Ambulatory Visit: Payer: Self-pay | Admitting: Cardiovascular Disease

## 2023-08-15 ENCOUNTER — Other Ambulatory Visit: Payer: Self-pay | Admitting: Family

## 2023-08-15 ENCOUNTER — Other Ambulatory Visit: Payer: Self-pay

## 2023-08-15 ENCOUNTER — Other Ambulatory Visit (HOSPITAL_BASED_OUTPATIENT_CLINIC_OR_DEPARTMENT_OTHER): Payer: Self-pay | Admitting: *Deleted

## 2023-08-15 DIAGNOSIS — E7849 Other hyperlipidemia: Secondary | ICD-10-CM

## 2023-08-15 MED ORDER — ROSUVASTATIN CALCIUM 10 MG PO TABS: 10.0000 mg | ORAL_TABLET | Freq: Every day | ORAL | 0 refills | Status: DC

## 2023-08-15 MED ORDER — LISINOPRIL 20 MG PO TABS
30.0000 mg | ORAL_TABLET | Freq: Every day | ORAL | 1 refills | Status: DC
Start: 2023-08-15 — End: 2023-12-17

## 2023-08-23 ENCOUNTER — Other Ambulatory Visit: Payer: Self-pay

## 2023-08-23 ENCOUNTER — Ambulatory Visit: Admitting: Family Medicine

## 2023-08-23 ENCOUNTER — Encounter: Payer: Self-pay | Admitting: Family Medicine

## 2023-08-23 VITALS — BP 136/88 | HR 79 | Ht 67.0 in | Wt 212.0 lb

## 2023-08-23 DIAGNOSIS — M25561 Pain in right knee: Secondary | ICD-10-CM | POA: Diagnosis not present

## 2023-08-23 DIAGNOSIS — G8929 Other chronic pain: Secondary | ICD-10-CM

## 2023-08-23 DIAGNOSIS — Z8774 Personal history of (corrected) congenital malformations of heart and circulatory system: Secondary | ICD-10-CM | POA: Diagnosis not present

## 2023-08-23 NOTE — Progress Notes (Signed)
 I, Tracey [name], CMA acting as a scribe for Tracey Juniper, MD.  Tracey [name] is a 48 y.o. female who presents to [facility] at [facility] today for exacerbation of her R knee pain. Pt was last seen by Dr. Alease Hunter on 04/04/23 and was advised to use Tylenol , Voltaren gel, and was referred to PT. Never completed any PT visits.  Today, pt reports that the knee was feeling some better so she did not scheduled with PT. More recently, notes exacerbation of right knee sx. Feels a snapping sensation when flexing the knee to 90 degrees, causing radiating pain into the lateral aspect of the lower leg. Sx worse with full flexion. Visible swelling present.   She notes popping and clicking in the knee with other mechanical symptoms.  She has a little bit of locking occasionally.  Dx imaging: 03/05/23 R knee XR   Pertinent review of systems: No fevers or chills.  Relevant historical information: Patent foramen ovale closed with MRI conditional device. History of a stroke due to the PFO.   Exam:  BP 136/88   Pulse 79   Ht 5\' 7"  (1.702 m)   Wt 212 lb (96.2 kg)   SpO2 97%   BMI 33.20 kg/m  General: Well Developed, well nourished, and in no acute distress.   MSK: Right knee large effusion otherwise normal. Tender palpation lateral joint line. Positive lateral McMurray's test.    Lab and Radiology Results  Procedure: Real-time Ultrasound Guided Injection of right knee joint superior lateral patella space Device: Philips Affiniti 50G/GE Logiq Images permanently stored and available for review in PACS Verbal informed consent obtained.  Discussed risks and benefits of procedure. Warned about infection, bleeding, hyperglycemia damage to structures among others. Patient expresses understanding and agreement Time-out conducted.   Noted no overlying erythema, induration, or other signs of local infection.   Skin prepped in a sterile fashion.   Local anesthesia: Topical Ethyl  chloride.   With sterile technique and under real time ultrasound guidance: 40 mg of Kenalog and 2 mL of Marcaine  injected into knee joint. Fluid seen entering the joint capsule.   Completed without difficulty   Pain immediately resolved suggesting accurate placement of the medication.   Advised to call if fevers/chills, erythema, induration, drainage, or persistent bleeding.   Images permanently stored and available for review in the ultrasound unit.  Impression: Technically successful ultrasound guided injection.       Assessment and Plan: 48 y.o. female with right lateral knee pain.  Patient does not have much arthritis on x-ray.  Based on mechanical symptoms and degenerative appearance of the lateral meniscus on ultrasound we will proceed to MRI to further evaluate meniscus for potential surgical planning.  Steroid injections today. She does have an MRI conditional PFO occluding device.  Details provided in MRI order.   PDMP not reviewed this encounter. Orders Placed This Encounter  Procedures   US  LIMITED JOINT SPACE STRUCTURES LOW RIGHT(NO LINKED CHARGES)    Reason for Exam (SYMPTOM  OR DIAGNOSIS REQUIRED):   right knee pain    Preferred imaging location?:   [facility]   MR Knee Right Wo Contrast    Standing Status:   Future    Expiration Date:   08/22/2024    What is the patient's sedation requirement?:   No Sedation    Does the patient have a pacemaker or implanted devices?:   Yes    Manufacturer of pacemake or implanted device?:  [employer]ISMAN PFO Occluder 25-18    Year of pacemaker or implanted device?:   06/08/2020    Preferred imaging location?:   [facility] (table limit-550lbs)   No orders of the defined types were placed in this encounter.    Discussed warning signs or symptoms. Please see discharge instructions. Patient expresses understanding.   The above documentation has been reviewed and is accurate and complete Tracey [name],  M.D.

## 2023-08-23 NOTE — Patient Instructions (Addendum)
 Thank you for coming in today.   You received an injection today. Seek immediate medical attention if the joint becomes red, extremely painful, or is oozing fluid.   We are ordering an MRI for you today.  The imaging office will be calling you to schedule your appointment after we obtain authorization from your insurance company.  If you have not heard from [facility] within a week, please call [phone] to schedule your MRI.   Please be sure you have signed up for MyChart so that we can get your results to you.  We will be in touch with you as soon as we can.  Please know, it can take up to 3-4 business days for the radiologist and Dr. Alease Hunter to have time to review the results and determine the best plan of care.  If there is something that appears to be surgical or needs a referral to other specialists we will let you know through MyChart or telephone.  Otherwise we will plan to schedule a follow up appointment with Dr. Alease Hunter once we have the results.

## 2023-08-30 ENCOUNTER — Encounter: Payer: Self-pay | Attending: Psychology | Admitting: Psychology

## 2023-08-31 ENCOUNTER — Other Ambulatory Visit: Payer: Self-pay | Admitting: Internal Medicine

## 2023-08-31 DIAGNOSIS — Z1231 Encounter for screening mammogram for malignant neoplasm of breast: Secondary | ICD-10-CM

## 2023-09-04 ENCOUNTER — Ambulatory Visit
Admission: RE | Admit: 2023-09-04 | Discharge: 2023-09-04 | Disposition: A | Discharge status: Home / Self Care | Source: Ambulatory Visit | Attending: Family Medicine | Admitting: Family Medicine

## 2023-09-04 DIAGNOSIS — G8929 Other chronic pain: Secondary | ICD-10-CM

## 2023-09-05 ENCOUNTER — Ambulatory Visit: Payer: Self-pay | Admitting: Family Medicine

## 2023-09-05 DIAGNOSIS — G8929 Other chronic pain: Secondary | ICD-10-CM

## 2023-09-05 NOTE — Progress Notes (Signed)
 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  Additionally there are few small loose bodies in the knee joint around the kneecap.  The loose bodies could be causing some of the mechanical symptoms but not much of the pain.  The arthritis is causing the pain.  It may be reasonable to have a consultation with an orthopedic surgeon to talk about removal of the loose bodies as that could improve the mechanical symptoms.  Would you like me to place a referral?

## 2023-09-14 ENCOUNTER — Other Ambulatory Visit: Payer: Self-pay | Admitting: Internal Medicine

## 2023-09-14 ENCOUNTER — Ambulatory Visit (HOSPITAL_BASED_OUTPATIENT_CLINIC_OR_DEPARTMENT_OTHER): Payer: Self-pay | Admitting: Orthopaedic Surgery

## 2023-09-14 ENCOUNTER — Ambulatory Visit (HOSPITAL_BASED_OUTPATIENT_CLINIC_OR_DEPARTMENT_OTHER): Admitting: Orthopaedic Surgery

## 2023-09-14 ENCOUNTER — Other Ambulatory Visit (HOSPITAL_BASED_OUTPATIENT_CLINIC_OR_DEPARTMENT_OTHER): Payer: Self-pay

## 2023-09-14 DIAGNOSIS — F330 Major depressive disorder, recurrent, mild: Secondary | ICD-10-CM

## 2023-09-14 DIAGNOSIS — M25361 Other instability, right knee: Secondary | ICD-10-CM

## 2023-09-14 MED ORDER — ACETAMINOPHEN 500 MG PO TABS
500.0000 mg | ORAL_TABLET | Freq: Three times a day (TID) | ORAL | 0 refills | Status: AC
  Filled 2023-09-14: qty 30, 10d supply, fill #0

## 2023-09-14 MED ORDER — ASPIRIN 325 MG PO TBEC
325.0000 mg | DELAYED_RELEASE_TABLET | Freq: Every day | ORAL | 0 refills | Status: DC
  Filled 2023-09-14: qty 14, 14d supply, fill #0

## 2023-09-14 MED ORDER — IBUPROFEN 800 MG PO TABS
800.0000 mg | ORAL_TABLET | Freq: Three times a day (TID) | ORAL | 0 refills | Status: AC
  Filled 2023-09-14: qty 30, 10d supply, fill #0

## 2023-09-14 MED ORDER — OXYCODONE HCL 5 MG PO TABS
5.0000 mg | ORAL_TABLET | ORAL | 0 refills | Status: DC | PRN
  Filled 2023-09-14: qty 10, 4d supply, fill #0

## 2023-09-14 NOTE — Progress Notes (Signed)
 Chief Complaint: Right knee pain     History of Present Illness:    Tracey [name] is a 48 y.o. female presents today with ongoing right knee pain since 2011.  Since this time she has had what sounds to be like patellar instability in terms of the knee buckling and giving out.  She specifically states that her son does have a history of patellar instability as well although he has suffered full dislocations where she suffers typically more subluxations.  Her mother has had 2 knee replacements and as result she is hoping to get any type of management that can hopefully avoid this.  She does experiencing popping and clicking with pain about the lateral retinaculum.  The knee does stay swollen for the most part.  Denies any hip pain or hip issues.  She has had 2 injections without any relief.    PMH/PSH/Family History/Social History/Meds/Allergies:    Past Medical History:  Diagnosis Date   Anxiety    Bronchitis    Carpal tunnel syndrome on both sides    Constipation    Depression    Family history of adverse reaction to anesthesia    mother had problems waking up from surgery.   Gestational diabetes    Gestational diabetes mellitus    Normal 2 hr GTT postpartum   Headache    migraines   High cholesterol    Hypertension    Infertility, female    Lactose intolerance    Oligohydramnios antepartum 11/09/2017   Palpitations    Panic attack 2017   diagnosed 3 months ago. no meds presently   Reflux    did not filll prescription   S/P percutaneous patent foramen ovale closure 06/16/2020   s/p PFO occluder device with a a 25 MM AMPLATZER by Dr. Wonda   Stroke [facility])    Swelling    Vitamin D  deficiency    Past Surgical History:  Procedure Laterality Date   BREAST EXCISIONAL BIOPSY Right    cyst   BUBBLE STUDY  04/16/2020   Procedure: BUBBLE STUDY;  Surgeon: Raford Riggs, MD;  Location: [facility] ENDOSCOPY;  Service: Cardiovascular;;   DILATION AND EVACUATION N/A 07/26/2015    Procedure: DILATATION AND EVACUATION;  Surgeon: Carlin DELENA Centers, MD;  Location: [facility] ORS;  Service: Gynecology;  Laterality: N/A;   PATENT FORAMEN OVALE(PFO) CLOSURE N/A 06/16/2020   Procedure: PATENT FORAMEN OVALE (PFO) CLOSURE;  Surgeon: Wonda Sharper, MD;  Location: [facility] INVASIVE CV LAB;  Service: Cardiovascular;  Laterality: N/A;   TEE WITHOUT CARDIOVERSION N/A 04/16/2020   Procedure: TRANSESOPHAGEAL ECHOCARDIOGRAM (TEE);  Surgeon: Raford Riggs, MD;  Location: [facility] ENDOSCOPY;  Service: Cardiovascular;  Laterality: N/A;   Social History   Socioeconomic History   Marital status: Married    Spouse name: Not on file   Number of children: Not on file   Years of education: Not on file   Highest education level: Some college, no degree  Occupational History   Occupation: Not Working  Tobacco Use   Smoking status: Every Day    Current packs/day: 0.25    Types: Cigarettes   Smokeless tobacco: Never   Tobacco comments:    4 cig/day  Vaping Use   Vaping status: Never Used  Substance and Sexual Activity   Alcohol use: Yes    Comment: wine occ   Drug use: No   Sexual activity: Yes    Birth control/protection: I.U.D.  Other Topics Concern   Not on file  Social History  Narrative   Lives at home with spouse and her 2 children   Right handed   Caffeine: 2 cups/day   Pt works   Teacher, early years/pre Strain: Low Risk  (07/04/2022)   Overall Financial Resource Strain (CARDIA)    Difficulty of Paying Living Expenses: Not hard at all  Food Insecurity: No Food Insecurity (07/04/2022)   Hunger Vital Sign    Worried About Running Out of Food in the Last Year: Never true    Ran Out of Food in the Last Year: Never true  Transportation Needs: No Transportation Needs (07/04/2022)   PRAPARE - Administrator, Civil Service (Medical): No    Lack of Transportation (Non-Medical): No  Physical Activity: Insufficiently Active (07/04/2022)   Exercise Vital Sign     Days of Exercise per Week: 3 days    Minutes of Exercise per Session: 30 min  Stress: Stress Concern Present (07/04/2022)   [employer] of Occupational Health - Occupational Stress Questionnaire    Feeling of Stress : Very much  Social Connections: Moderately Integrated (07/04/2022)   Social Connection and Isolation Panel    Frequency of Communication with Friends and Family: More than three times a week    Frequency of Social Gatherings with Friends and Family: Twice a week    Attends Religious Services: More than 4 times per year    Active Member of [employer] or Organizations: No    Attends Engineer, structural: Not on file    Marital Status: Married   Family History  Problem Relation Age of Onset   Hypertension Mother    Stroke Mother    Anxiety disorder Father    Depression Father    Alcoholism Father    Diabetes Maternal Aunt    Diabetes Maternal Uncle    Breast cancer Paternal Aunt    Stroke Maternal Grandmother    No Known Allergies Current Outpatient Medications  Medication Sig Dispense Refill   acetaminophen  (TYLENOL ) 500 MG tablet Take 1 tablet (500 mg total) by mouth every 8 (eight) hours for 10 days. 30 tablet 0   aspirin  EC 325 MG tablet Take 1 tablet (325 mg total) by mouth daily. 14 tablet 0   ibuprofen  (ADVIL ) 800 MG tablet Take 1 tablet (800 mg total) by mouth every 8 (eight) hours for 10 days. Please take with food, please alternate with acetaminophen  30 tablet 0   oxyCODONE  (ROXICODONE ) 5 MG immediate release tablet Take 1 tablet (5 mg total) by mouth every 4 (four) hours as needed for severe pain (pain score 7-10) or breakthrough pain. 10 tablet 0   aspirin  81 MG EC tablet Take 81 mg by mouth daily. Swallow whole.     buPROPion  (WELLBUTRIN  XL) 150 MG 24 hr tablet Take 1 tablet (150 mg total) by mouth daily. 90 tablet 0   levonorgestrel  (MIRENA , 52 MG,) 20 MCG/24HR IUD 1 Intra Uterine Device (1 each total) by Intrauterine route once for 1 dose. 1 each 0    lisinopril  (ZESTRIL ) 20 MG tablet Take 1.5 tablets (30 mg total) by mouth daily. Patient has to keep yearly appointment for any future refills. 135 tablet 1   Multiple Vitamins-Minerals (MULTIVITAMIN WITH MINERALS) tablet Take 1 tablet by mouth daily.     Probiotic Product (PROBIOTIC DAILY PO) Take 1 tablet by mouth daily.      rosuvastatin  (CRESTOR ) 10 MG tablet Take 1 tablet (10 mg total) by mouth daily. Please keep your appointment  on 7/14 with Cardiology in order to receive future refills. Thank You. 90 tablet 0   sertraline  (ZOLOFT ) 100 MG tablet Take 1 tablet (100 mg total) by mouth daily. 30 tablet 1   sertraline  (ZOLOFT ) 50 MG tablet Take 1 tablet by mouth daily. 90 tablet 0   sertraline  (ZOLOFT ) 50 MG tablet Take 1 tablet (50 mg total) by mouth daily. 90 tablet 0   traZODone  (DESYREL ) 50 MG tablet Take 1-1.5 tablets (50-75 mg total) by mouth at bedtime. 45 tablet 11   Vitamin D , Ergocalciferol , (DRISDOL ) 1.25 MG (50000 UNIT) CAPS capsule Take 1 capsule (50,000 Units total) by mouth every 7 (seven) days. 12 capsule 3   No current facility-administered medications for this visit.   No results found.  Review of Systems:   A ROS was performed including pertinent positives and negatives as documented in the HPI.  Physical Exam :   Constitutional: NAD and appears stated age Neurological: Alert and oriented Psych: Appropriate affect and cooperative There were no vitals taken for this visit.   Comprehensive Musculoskeletal Exam:    Right knee range of motion from -3 to 130 degrees.  She has 4 quadrants of lateral motion with positive patella instability and apprehension.  She does have an effusion about the knee.  No joint line tenderness.  There is tenderness about the lateral patellofemoral joint   Imaging:   Xray (4 views right knee): Normal  MRI (right knee): There is some chondromalacia involving the lateral patellar facet although no full-thickness cartilage loss, multiple  loose bodies and thinning of the medial patellofemoral ligament   I personally reviewed and interpreted the radiographs.   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  She has had multiple injections without any relief.  She does have loose bodies as a result.  At this time I did discuss treatment options.  She has tried physical therapy in the past particular after her stroke where she was engaging both sides.  Her right side has taken the brunt of this rehabilitation as a result I do believe this is the more unstable side.  With that in mind I did discuss surgical options.  I do believe that she would benefit from diagnostic arthroscopy and loose body removal as well as MPFL reconstruction.  I did discuss risks and limitations as well as associated recovery timeframe.  After discussion she would like to proceed with this.  -Plan for right knee diagnostic arthroscopy with medial patellofemoral ligament reconstruction   I personally saw and evaluated the patient, and participated in the management and treatment plan.  Elspeth Parker, MD Attending Physician, Orthopedic Surgery  This document was dictated using Dragon voice recognition software. A reasonable attempt at proof reading has been made to minimize errors.

## 2023-09-17 ENCOUNTER — Encounter: Admitting: Obstetrics & Gynecology

## 2023-09-20 ENCOUNTER — Other Ambulatory Visit (HOSPITAL_BASED_OUTPATIENT_CLINIC_OR_DEPARTMENT_OTHER): Payer: Self-pay | Admitting: Orthopaedic Surgery

## 2023-09-20 DIAGNOSIS — M25361 Other instability, right knee: Secondary | ICD-10-CM

## 2023-10-01 ENCOUNTER — Encounter (HOSPITAL_BASED_OUTPATIENT_CLINIC_OR_DEPARTMENT_OTHER): Payer: Self-pay | Admitting: Family

## 2023-10-01 ENCOUNTER — Encounter (HOSPITAL_BASED_OUTPATIENT_CLINIC_OR_DEPARTMENT_OTHER): Payer: Self-pay

## 2023-10-01 ENCOUNTER — Other Ambulatory Visit (HOSPITAL_BASED_OUTPATIENT_CLINIC_OR_DEPARTMENT_OTHER): Payer: Self-pay

## 2023-10-01 ENCOUNTER — Ambulatory Visit (HOSPITAL_BASED_OUTPATIENT_CLINIC_OR_DEPARTMENT_OTHER): Admitting: Family

## 2023-10-01 VITALS — BP 130/90 | HR 66 | Ht 66.0 in | Wt 212.0 lb

## 2023-10-01 DIAGNOSIS — Z8673 Personal history of transient ischemic attack (TIA), and cerebral infarction without residual deficits: Secondary | ICD-10-CM | POA: Diagnosis not present

## 2023-10-01 DIAGNOSIS — Z720 Tobacco use: Secondary | ICD-10-CM | POA: Diagnosis not present

## 2023-10-01 DIAGNOSIS — E785 Hyperlipidemia, unspecified: Secondary | ICD-10-CM | POA: Diagnosis not present

## 2023-10-01 DIAGNOSIS — I10 Essential (primary) hypertension: Secondary | ICD-10-CM | POA: Diagnosis not present

## 2023-10-01 MED ORDER — VARENICLINE TARTRATE 1 MG PO TABS
1.0000 mg | ORAL_TABLET | Freq: Two times a day (BID) | ORAL | 0 refills | Status: DC
  Filled 2023-10-01: qty 168, 84d supply, fill #0

## 2023-10-01 MED ORDER — VARENICLINE TARTRATE 0.5 MG PO TABS
0.5000 mg | ORAL_TABLET | Freq: Two times a day (BID) | ORAL | 0 refills | Status: DC
  Filled 2023-10-01: qty 12, 6d supply, fill #0

## 2023-10-01 NOTE — Progress Notes (Signed)
 Cardiology Office Note   Date:  10/01/2023  ID:  BERNIECE [name], DOB December 01, 1975, MRN [id number] PCP: Joshua Debby CROME, MD  [facility] Providers Cardiologist:  Annabella Scarce, MD     History of Present Illness Tracey [name] is a 48 y.o. female with a hx of hypertension, stroke, prior tobacco use, PFO s/p PFO closure.   Diagnosed with hypertension at the birth of her youngest child in 2020 and started on lisinopril .  Presented to the hospital 03/2020 with acute stroke with neck discomfort and left-sided numbness and weakness.  Found to have small acute infarct in right posterior frontal lobe which is thought to be embolic.  LE Dopplers negative for DVT.  Monitor with no atrial fibrillation.  She followed with Dr. Wonda and had 25 mm Amplatzer occluder device implanted 06/16/2020.  Postprocedural echo stable with no residual shunting. Echo 06/15/2021 LVEF 55-60%, no evidence of shunting.  Presents today for follow up with her children. Feeling overall well since last seen. Motivated to quit smoking but reports understandably difficult. She is presently pending knee surgery, hopeful to be able to improve physical activity afterwards. We reviewed cardiovascular risk factors and risk reduction. Reports no shortness of breath nor dyspnea on exertion. Reports no chest pain, pressure, or tightness. No edema, orthopnea, PND. Reports no palpitations.    ROS: Please see the history of present illness.    All other systems reviewed and are negative.   Studies Reviewed EKG Interpretation Date/Time:  Monday October 01 2023 15:36:11 EDT Ventricular Rate:  66 PR Interval:  144 QRS Duration:  90 QT Interval:  392 QTC Calculation: 410 R Axis:   32  Text Interpretation: Normal sinus rhythm with sinus arrhythmia Normal ECG Confirmed by Vannie Mora ([id number]) on 10/01/2023 3:41:29 PM    Cardiac Studies & Procedures    ______________________________________________________________________________________________ CARDIAC CATHETERIZATION  CARDIAC CATHETERIZATION 06/16/2020  Conclusion SUCCESSFUL TRANSCATHETER PFO CLOSURE USING INTRACARDIAC ECHO AND FLUOROSCOPIC GUIDANCE. DEFECT CLOSED WITH A 25 MM AMPLATZER PFO OCCLUDER DEVICE  RECOMMEND:  ASA/CLOPIDOGREL  X 6 MONTHS  SBE PROPHYLAXIS X 6 MONTHS  LIMITED 2D ECHO POST-PROCEDURE ASSESSMENT     ECHOCARDIOGRAM  ECHOCARDIOGRAM LIMITED BUBBLE STUDY 06/15/2021  Narrative ECHOCARDIOGRAM LIMITED REPORT    Patient Name:   Tracey [name]  Date of Exam: 06/15/2021 Medical Rec #:  [id number]     Height:       67.0 in Accession #:    [id number]    Weight:       206.0 lb Date of Birth:  January 10, 1976    BSA:          2.048 m Patient Age:    48 years      BP:           139/94 mmHg Patient Gender: F             HR:           65 bpm. Exam Location:  [address]  Procedure: Limited Echo, Cardiac Doppler, Limited Color Doppler and Strain Analysis  MODIFIED REPORT: This report was modified by Kardie Tobb DO on 06/16/2021 due to reviewed for shunt, changed no shunt seen. Indications:     Z87.74 PFO  History:         Patient has prior history of Echocardiogram examinations, most recent 06/16/2020. Stroke; Risk Factors:Hypertension.  Sonographer:     Waldo Guadalajara RCS Referring Phys:  [id number] KATHRYN R THOMPSON Diagnosing Phys: Kardie Tobb DO  IMPRESSIONS   1. Left ventricular  ejection fraction, by estimation, is 55 to 60%. The left ventricle has normal function. There is mild asymmetric left ventricular hypertrophy of the inferior segment. Left ventricular diastolic function could not be evaluated. 2. The mitral valve is normal in structure. Trivial mitral valve regurgitation. No evidence of mitral stenosis. 3. The aortic valve is tricuspid. Aortic valve regurgitation is not visualized. No aortic stenosis is present. 4. Agitated saline contrast bubble study  was negative, with no evidence of any interatrial shunt.  FINDINGS Left Ventricle: Left ventricular ejection fraction, by estimation, is 55 to 60%. The left ventricle has normal function. There is mild asymmetric left ventricular hypertrophy of the inferior segment. Left ventricular diastolic function could not be evaluated.  Pericardium: Presence of epicardial fat layer.  Mitral Valve: The mitral valve is normal in structure. Trivial mitral valve regurgitation. No evidence of mitral valve stenosis.  Aortic Valve: The aortic valve is tricuspid. Aortic valve regurgitation is not visualized. No aortic stenosis is present.  Pulmonic Valve: The pulmonic valve was normal in structure. Pulmonic valve regurgitation is not visualized. No evidence of pulmonic stenosis.  IAS/Shunts: No atrial level shunt detected by color flow Doppler. Agitated saline contrast bubble study was negative, with no evidence of any interatrial shunt.  LEFT VENTRICLE PLAX 2D LVIDd:         4.60 cm LVIDs:         3.30 cm LV PW:         1.20 cm LV IVS:        0.80 cm LVOT diam:     1.80 cm LVOT Area:     2.54 cm   LEFT ATRIUM         Index LA diam:    3.60 cm 1.76 cm/m  AORTA Ao Root diam: 2.80 cm  MITRAL VALVE MV Area (PHT): 3.89 cm    SHUNTS MV Decel Time: 195 msec    Systemic Diam: 1.80 cm MV E velocity: 82.80 cm/s MV A velocity: 73.90 cm/s MV E/A ratio:  1.12  Kardie Tobb DO Electronically signed by Dub Huntsman DO Signature Date/Time: 06/15/2021/3:42:41 PM    Final (Updated)   TEE  ECHO TEE 04/16/2020  Narrative TRANSESOPHOGEAL ECHO REPORT    Patient Name:   Tracey [name] Date of Exam: 04/16/2020 Medical Rec #:  [id number]    Height:       67.0 in Accession #:    [id number]   Weight:       215.0 lb Date of Birth:  08-01-1975   BSA:          2.085 m Patient Age:    44 years     BP:           140/93 mmHg Patient Gender: F            HR:           89 bpm. Exam Location:   Outpatient  Procedure: Transesophageal Echo, Cardiac Doppler, Color Doppler and Saline Contrast Bubble Study  Indications:     CVA  History:         Patient has prior history of Echocardiogram examinations, most recent 04/03/2020. Stroke; Risk Factors:Hypertension and Former Smoker. PFO.  Sonographer:     Lyle Marc Referring Phys:  [id number] POWELL BRAVO PEM[place]ERTON Diagnosing Phys: Annabella Scarce MD  PROCEDURE: After discussion of the risks and benefits of a TEE, an informed consent was obtained from the patient. The transesophogeal probe was passed without difficulty through the esophogus of the  patient. Sedation performed by different physician. The patient was monitored while under deep sedation. Anesthestetic sedation was provided intravenously by Anesthesiology: 320.63mg  of Propofol . Image quality was excellent. The patient's vital signs; including heart rate, blood pressure, and oxygen saturation; remained stable throughout the procedure. The patient developed no complications during the procedure.  IMPRESSIONS   1. Left ventricular ejection fraction, by estimation, is 60 to 65%. The left ventricle has normal function. The left ventricle has no regional wall motion abnormalities. 2. Right ventricular systolic function is normal. The right ventricular size is normal. 3. No left atrial/left atrial appendage thrombus was detected. The LAA emptying velocity was 71 cm/s. 4. The mitral valve is normal in structure. Trivial mitral valve regurgitation. No evidence of mitral stenosis. 5. The aortic valve is tricuspid. Aortic valve regurgitation is not visualized. No aortic stenosis is present. 6. Evidence of atrial level shunting detected by color flow Doppler. Agitated saline contrast bubble study was positive with shunting observed within 3-6 cardiac cycles suggestive of interatrial shunt. There is a small patent foramen ovale with predominantly right to left shunting across the atrial  septum.  Conclusion(s)/Recommendation(s): Normal biventricular function without evidence of hemodynamically significant valvular heart disease.  FINDINGS Left Ventricle: Left ventricular ejection fraction, by estimation, is 60 to 65%. The left ventricle has normal function. The left ventricle has no regional wall motion abnormalities. The left ventricular internal cavity size was normal in size. There is no left ventricular hypertrophy.  Right Ventricle: The right ventricular size is normal. No increase in right ventricular wall thickness. Right ventricular systolic function is normal.  Left Atrium: Left atrial size was normal in size. No left atrial/left atrial appendage thrombus was detected. The LAA emptying velocity was 71 cm/s.  Right Atrium: Right atrial size was normal in size.  Pericardium: There is no evidence of pericardial effusion.  Mitral Valve: The mitral valve is normal in structure. Trivial mitral valve regurgitation. No evidence of mitral valve stenosis.  Tricuspid Valve: The tricuspid valve is normal in structure. Tricuspid valve regurgitation is trivial. No evidence of tricuspid stenosis.  Aortic Valve: The aortic valve is tricuspid. Aortic valve regurgitation is not visualized. No aortic stenosis is present.  Pulmonic Valve: The pulmonic valve was normal in structure. Pulmonic valve regurgitation is trivial. No evidence of pulmonic stenosis.  Aorta: The aortic root is normal in size and structure.  IAS/Shunts: Evidence of atrial level shunting detected by color flow Doppler. Agitated saline contrast was given intravenously to evaluate for intracardiac shunting. Agitated saline contrast bubble study was positive with shunting observed within 3-6 cardiac cycles suggestive of interatrial shunt. A small patent foramen ovale is detected with predominantly right to left shunting across the atrial septum.  Annabella Scarce MD Electronically signed by Annabella Scarce  MD Signature Date/Time: 04/16/2020/2:20:28 PM    Final  MONITORS  CARDIAC EVENT MONITOR 05/19/2020  Narrative  Patient's monitoring period was from 04/16/20-05/15/20  Predominant rhythm was NSR with average HR 69bpm ranging from 45bpm to 127bpm.  There were rare SVEs (<1%), occasional PVCs (1%, 16977)  No afib, significant pauses or VT  Overall, no significant arrhythmias detected on the monitor  Powell Sorrow, MD       ______________________________________________________________________________________________      Risk Assessment/Calculations          Physical Exam VS:  BP (!) 130/90   Pulse 66   Ht 5' 6 (1.676 m)   Wt 212 lb (96.2 kg)   SpO2 99%   BMI 34.22  kg/m        Wt Readings from Last 3 Encounters:  10/01/23 212 lb (96.2 kg)  08/23/23 212 lb (96.2 kg)  05/01/23 220 lb (99.8 kg)    GEN: Well nourished, well developed in no acute distress NECK: No JVD; No carotid bruits CARDIAC: RRR, no murmurs, rubs, gallops RESPIRATORY:  Clear to auscultation without rales, wheezing or rhonchi  ABDOMEN: Soft, non-tender, non-distended EXTREMITIES:  No edema; No deformity   ASSESSMENT AND PLAN Preop - Per AHA/ACC guidelines, she is deemed acceptable risk for the planned procedure without additional cardiovascular testing.  May hold aspirin  7 days prior to planned procedure.   HTN- BP well controlled. Continue current antihypertensive regimen Lisinopril  30mg  QD. BP well controlled. Continue current antihypertensive regimen.     PFO s/p PFO closure-performed 05/2020 by Dr. Wonda.  Echo 05/2021 with normal LVEF and no shunting.   HLD, HLD, LDL goal <70 - Continue Rosuvastatin  10mg  daily. Update lipid panel, CMET, lipoprotein (a). She will return for fasting labs over the next 2 weeks at her convenience.   Cryptogenic CVA-continue aspirin  and Rosuvastatin  10mg  daily.  Due to history of CVA, HLD plan for coronary calcium  score.   Tobacco use - Rx Varnecline  for smoking cessation. Refer to virtual smoking cessation team.   Depression - Continue to follow with PCP.          Dispo: follow up in 3-4 months  Signed, Reche GORMAN Finder, NP

## 2023-10-01 NOTE — Patient Instructions (Addendum)
 Medication Instructions:  Recommend Varenicline  for smoking cessation Days 1-3 take 0.5mg  once per day Days 4-7 take 0.5mg  twice per day Day 8 and onward take 1mg  twice per day. You will take for a full 12 weeks  *If you need a refill on your cardiac medications before your next appointment, please call your pharmacy*  Lab Work: Your physician recommends that you return for lab work this week or next for fasting lab work to check lipid panel, lipoprotein a, CMET  If you have labs (blood work) drawn today and your tests are completely normal, you will receive your results only by: MyChart Message (if you have MyChart) OR A paper copy in the mail If you have any lab test that is abnormal or we need to change your treatment, we will call you to review the results.  Testing/Procedures:  Your provider has recommended a coronary calcium  score  Follow-Up: At [facility], you and your health needs are our priority.  As part of our continuing mission to provide you with exceptional heart care, our providers are all part of one team.  This team includes your primary Cardiologist (physician) and Advanced Practice Providers or APPs (Physician Assistants and Nurse Practitioners) who all work together to provide you with the care you need, when you need it.  Your next appointment:   3-4 month(s)  Provider:   Annabella Scarce, MD, Rosaline Bane, NP, or Reche Finder, NP    We recommend signing up for the patient portal called MyChart.  Sign up information is provided on this After Visit Summary.  MyChart is used to connect with patients for Virtual Visits (Telemedicine).  Patients are able to view lab/test results, encounter notes, upcoming appointments, etc.  Non-urgent messages can be sent to your provider as well.   To learn more about what you can do with MyChart, go to [email or web address].   Other Instructions       Heart Healthy Diet Recommendations: A low-salt diet is  recommended. Meats should be grilled, baked, or boiled. Avoid fried foods. Focus on lean protein sources like fish or chicken with vegetables and fruits. The American Heart Association is a Chief Technology Officer!  American Heart Association Diet and Lifeystyle Recommendations    Exercise recommendations: The American Heart Association recommends 150 minutes of moderate intensity exercise weekly. Try 30 minutes of moderate intensity exercise 4-5 times per week. This could include walking, jogging, or swimming.

## 2023-10-02 ENCOUNTER — Other Ambulatory Visit (HOSPITAL_BASED_OUTPATIENT_CLINIC_OR_DEPARTMENT_OTHER): Payer: Self-pay

## 2023-10-02 ENCOUNTER — Encounter (HOSPITAL_BASED_OUTPATIENT_CLINIC_OR_DEPARTMENT_OTHER): Payer: Self-pay

## 2023-10-03 ENCOUNTER — Other Ambulatory Visit (HOSPITAL_COMMUNITY): Payer: Self-pay

## 2023-10-03 ENCOUNTER — Other Ambulatory Visit (HOSPITAL_BASED_OUTPATIENT_CLINIC_OR_DEPARTMENT_OTHER): Payer: Self-pay

## 2023-10-04 ENCOUNTER — Other Ambulatory Visit: Payer: Self-pay

## 2023-10-04 DIAGNOSIS — F330 Major depressive disorder, recurrent, mild: Secondary | ICD-10-CM

## 2023-10-04 NOTE — Telephone Encounter (Signed)
 Copied from CRM [id number]. Topic: Clinical - Medication Refill >> Oct 04, 2023  3:45 PM Robinson H wrote: Medication: sertraline  (ZOLOFT ) 50 MG tablet   Has the patient contacted their pharmacy? Yes, pharmacy calling no active prescription on file (Agent: If no, request that the patient contact the pharmacy for the refill. If patient does not wish to contact the pharmacy document the reason why and proceed with request.) (Agent: If yes, when and what did the pharmacy advise?)  This is the patient's preferred pharmacy:    [facility] - [place], [place] - [address] [place] [address] Phone: [phone] Fax: [phone]   Is this the correct pharmacy for this prescription? Yes If no, delete pharmacy and type the correct one.   Has the prescription been filled recently? No  Is the patient out of the medication? Not sure  Has the patient been seen for an appointment in the last year OR does the patient have an upcoming appointment? No, pharmacy calling for patient  Can we respond through MyChart? N/A  Agent: Please be advised that Rx refills may take up to 3 business days. We ask that you follow-up with your pharmacy.

## 2023-10-08 ENCOUNTER — Encounter

## 2023-10-08 ENCOUNTER — Other Ambulatory Visit (HOSPITAL_BASED_OUTPATIENT_CLINIC_OR_DEPARTMENT_OTHER): Payer: Self-pay

## 2023-10-08 DIAGNOSIS — Z1231 Encounter for screening mammogram for malignant neoplasm of breast: Secondary | ICD-10-CM

## 2023-10-09 ENCOUNTER — Other Ambulatory Visit (HOSPITAL_BASED_OUTPATIENT_CLINIC_OR_DEPARTMENT_OTHER): Payer: Self-pay

## 2023-10-09 MED ORDER — SERTRALINE HCL 50 MG PO TABS
50.0000 mg | ORAL_TABLET | Freq: Every day | ORAL | 0 refills | Status: AC
  Filled 2023-10-09: qty 30, 30d supply, fill #0

## 2023-10-16 ENCOUNTER — Encounter: Admitting: Obstetrics & Gynecology

## 2023-10-18 ENCOUNTER — Encounter (HOSPITAL_BASED_OUTPATIENT_CLINIC_OR_DEPARTMENT_OTHER): Payer: Self-pay | Admitting: Orthopaedic Surgery

## 2023-10-18 ENCOUNTER — Other Ambulatory Visit (HOSPITAL_BASED_OUTPATIENT_CLINIC_OR_DEPARTMENT_OTHER): Payer: Self-pay

## 2023-10-18 DIAGNOSIS — M25361 Other instability, right knee: Secondary | ICD-10-CM | POA: Diagnosis not present

## 2023-10-18 NOTE — Progress Notes (Signed)
 Date of Surgery: 10/18/2023  INDICATIONS: Tracey [name] is a 48 y.o.-year-old female with right knee recurrent patellar instability, pain.  The risk and benefits of the procedure were discussed in detail and documented in the pre-operative evaluation.   PREOPERATIVE DIAGNOSIS: 1.  Right knee patellar instability  POSTOPERATIVE DIAGNOSIS: Same.  PROCEDURE: 1.  Right knee medial patellofemoral ligament reconstruction 2.  Right knee arthroscopy with medial chondroplasty  SURGEON: Tracey LITTIE Parker MD  ASSISTANT: Conley Dawson, ATC  ANESTHESIA:  general  IV FLUIDS AND URINE: See anesthesia record.  ANTIBIOTICS: Ancef   ESTIMATED BLOOD LOSS: 10 mL.  IMPLANTS:  * No surgical log found *  DRAINS: None  CULTURES: None  COMPLICATIONS: none  DESCRIPTION OF PROCEDURE:   Examination under anesthesia: A careful examination under anesthesia was performed.  Knee ROM motion was: -3 - 135 Lachman: Normal Pivot Shift: Normal Posterior drawer: normal.   Varus stability in full extension: normal.   Varus stability in 30 degrees of flexion: normal.  Valgus stability in full extension: normal.   Valgus stability in 30 degrees of flexion: normal.  Posterolateral drawer: normal   Intra-operative findings: A thorough arthroscopic examination of the knee was performed.  The findings are: 1. Suprapatellar pouch: Normal 2. Undersurface of median ridge: Normal 3. Medial patellar facet: Normal 4. Lateral patellar facet: Normal 5. Trochlea: Normal 6. Lateral gutter/popliteus tendon: Normal 7. Hoffa's fat pad: Normal 8. Medial gutter/plica: Normal 9. ACL: Normal 10. PCL: Normal 11. Medial meniscus: Normal 12. Medial compartment cartilage: 2 x 2-1/2 cm medial femoral condyle lesion 13. Lateral meniscus: Normal 14. Lateral compartment cartilage: Normal 4 quadrants of lateral patellar motion   I identified the patient in the pre-operative holding area.  I marked the operative knee with my  initials. I reviewed the risks and benefits of the proposed surgical intervention and the patient wished to proceed.  Anesthesia performed a peripheral nerve block.  Patient was subsequently taken back to the operating room.  The patient was transferred to the operative suite and placed in the supine position with all bony prominences padded.     SCDs were placed on the non-operative lower extremity. Appropriate antibiotics was administered within 1 hour before incision. The operative lower extremity was then prepped and draped in standard fashion. A time out was performed confirming the correct extremity, correct patient and correct procedure.    A standard anterolateral portal was made with an 11 blade.  The ideal position for the anteromedial portal was established using a spinal needle.  This AM portal was then created under direct visualization with an 11 blade.  A full diagnostic arthroscopy was then performed, as described above, including probing of the chondral and meniscal surfaces.  At this time shaver was introduced into the medial carpal compartment and chondroplasty was performed of the medial femoral condyle lesion.  A 6 3 wire was introduced into the medial knee and drilled across the subchondral plate in order to create a layer of fibrocartilage.  This had healthy bleeding.  I was satisfied with this.  At this time attention was turned to the MPFL.  15 blade was used to incise midline about the medial patella.  Electrocautery was used to achieve hemostasis.  This is dissected through the interval to layer 2 and 3.  Hemostat was used to spread between this.  A 0 Vicryl was used to the passing suture in this layer.  This was passed down the lateral pole of the medial abductor tubercle and 15  blade was used to create a counterincision.  Electrocautery was used to dissect down to the level of the bone.  At this time 2 anchors were used to anchor the graft into the medial patella.  These were 3.75  Jeannie anchors.  The tape was loaded into this and around the graft and these were placed into the proximal half equator of the bone.  At this time the graft was firmly anchored once this was done twice.  1 limb of each of the sutures was used as an active brace mechanism.  These were all then passed through the passing suture out the medial adductor counterincision.  At this time shuttles point was localized with the mini C arm.  The sutures were then loaded into a 4 5 Quatro link.  Isometry was tested with the tap.  These were then placed into shuttles point.  I was happy with the stability of the patella following this with 2 quadrants of lateral motion.  The wounds were thoroughly irrigated and closed in layers of 0 Vicryl 2-0 Vicryl and 3-0 nylon.   That concluded the case.  Skin was closed with 2-0 Vicryl and 3-0 nylon. Xeroform gauze, gauze, Tegaderm, Iceman and brace were applied.  Instrument, sponge, and needle counts were correct prior to wound closure and at the conclusion of the case.  The patient was taken to the PACU without complication   POSTOPERATIVE PLAN: She will be weightbearing as tolerated on the right leg.  She replaced on 2 weeks aspirin  for blood clot prevention.  She will begin physical therapy immediately postop.  Tracey LITTIE Parker, MD 1:34 PM

## 2023-10-20 ENCOUNTER — Other Ambulatory Visit: Payer: Self-pay

## 2023-10-20 ENCOUNTER — Ambulatory Visit (HOSPITAL_BASED_OUTPATIENT_CLINIC_OR_DEPARTMENT_OTHER): Attending: Orthopaedic Surgery | Admitting: Physical Therapy

## 2023-10-20 ENCOUNTER — Encounter (HOSPITAL_BASED_OUTPATIENT_CLINIC_OR_DEPARTMENT_OTHER): Payer: Self-pay | Admitting: Physical Therapy

## 2023-10-20 DIAGNOSIS — M25561 Pain in right knee: Secondary | ICD-10-CM | POA: Diagnosis not present

## 2023-10-20 DIAGNOSIS — M25361 Other instability, right knee: Secondary | ICD-10-CM | POA: Insufficient documentation

## 2023-10-20 DIAGNOSIS — R262 Difficulty in walking, not elsewhere classified: Secondary | ICD-10-CM | POA: Insufficient documentation

## 2023-10-20 DIAGNOSIS — M25661 Stiffness of right knee, not elsewhere classified: Secondary | ICD-10-CM | POA: Insufficient documentation

## 2023-10-20 NOTE — Therapy (Signed)
 OUTPATIENT PHYSICAL THERAPY EVALUATION   Patient Name: Tracey [name] MRN: [id number] DOB:1975/05/10, 48 y.o., female Today's Date: 10/22/2023  END OF SESSION:  PT End of Session - 10/22/23 0743     Visit Number 1    Number of Visits 25    Date for PT Re-Evaluation 01/12/24    Authorization Type BCBS    PT Start Time 0840    PT Stop Time 0915    PT Time Calculation (min) 35 min    Activity Tolerance Patient tolerated treatment well    Behavior During Therapy WFL for tasks assessed/performed           Past Medical History:  Diagnosis Date   Anxiety    Bronchitis    Carpal tunnel syndrome on both sides    Constipation    Depression    Family history of adverse reaction to anesthesia    mother had problems waking up from surgery.   GERD (gastroesophageal reflux disease)    Gestational diabetes    Gestational diabetes mellitus    Normal 2 hr GTT postpartum   Headache    migraines   High cholesterol    Hypertension    Infertility, female    Lactose intolerance    Oligohydramnios antepartum 11/09/2017   Palpitations    Panic attack 2017   diagnosed 3 months ago. no meds presently   Reflux    did not filll prescription   S/P percutaneous patent foramen ovale closure 06/16/2020   s/p PFO occluder device with a a 25 MM AMPLATZER by Dr. Wonda   Stroke [facility])    Swelling    Vitamin D  deficiency    Past Surgical History:  Procedure Laterality Date   BREAST EXCISIONAL BIOPSY Right    cyst   BUBBLE STUDY  04/16/2020   Procedure: BUBBLE STUDY;  Surgeon: Raford Riggs, MD;  Location: [facility] ENDOSCOPY;  Service: Cardiovascular;;   DILATION AND EVACUATION N/A 07/26/2015   Procedure: DILATATION AND EVACUATION;  Surgeon: Carlin DELENA Centers, MD;  Location: [facility] ORS;  Service: Gynecology;  Laterality: N/A;   PATENT FORAMEN OVALE(PFO) CLOSURE N/A 06/16/2020   Procedure: PATENT FORAMEN OVALE (PFO) CLOSURE;  Surgeon: Wonda Sharper, MD;  Location: [facility] INVASIVE CV LAB;  Service:  Cardiovascular;  Laterality: N/A;   TEE WITHOUT CARDIOVERSION N/A 04/16/2020   Procedure: TRANSESOPHAGEAL ECHOCARDIOGRAM (TEE);  Surgeon: Raford Riggs, MD;  Location: [facility] ENDOSCOPY;  Service: Cardiovascular;  Laterality: N/A;   Patient Active Problem List   Diagnosis Date Noted   Acute pain of right knee 03/05/2023   Need for hepatitis C screening test 03/05/2023   Screening for colon cancer 03/05/2023   Need for immunization against influenza 03/05/2023   Minor neurocognitive disorder 08/03/2022   Mild vascular neurocognitive disorder 08/03/2022   Dyslipidemia, goal LDL below 70 07/04/2022   Tobacco abuse 07/04/2022   Mild episode of recurrent major depressive disorder (HCC) 07/04/2022   Encounter for general adult medical examination with abnormal findings 07/04/2022   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   Other hyperlipidemia 08/16/2021   Class 1 obesity with serious comorbidity and body mass index (BMI) of 33.0 to 33.9 in adult 08/16/2021   Abnormal screening mammogram 08/16/2021   Psychophysiological insomnia 07/08/2020   Chronic migraine without aura without status migrainosus, not intractable 07/08/2020   S/P percutaneous patent foramen ovale closure 06/16/2020   PFO (patent foramen ovale) - ROPE score 5 05/31/2020   Hypertension    Vitamin D  deficiency 06/07/2017    REFERRING PROVIDER:  Genelle,  Elspeth, MD     REFERRING DIAG:  [phone] (ICD-10-CM) - Patellar instability of right knee    PROCEDURE: 1.  Right knee medial patellofemoral ligament reconstruction 2.  Right knee arthroscopy with medial chondroplasty  Rationale for Evaluation and Treatment: Rehabilitation  THERAPY DIAG:  Acute pain of right knee  Stiffness of right knee, not elsewhere classified  Difficulty in walking, not elsewhere classified  ONSET DATE: DOS 10/18/23   SUBJECTIVE:                                                                                                                                                                                            SUBJECTIVE STATEMENT: It is just so sore.  PERTINENT HISTORY:  Anxiety, stroke  PAIN:  Are you having pain? Yes: NPRS scale: 5/10 Pain location: Rt knee Pain description: sore Aggravating factors: walking Relieving factors: ice, meds  PRECAUTIONS:  None  RED FLAGS: None   WEIGHT BEARING RESTRICTIONS:  WBAT  FALLS:  Has patient fallen in last 6 months? No  LIVING ENVIRONMENT: Stairs to basement- spiral  OCCUPATION:  claims  PLOF:  Independent  PATIENT GOALS:  Walk better, decrease pain, walk for exercise   OBJECTIVE:  Note: Objective measures were completed at Evaluation unless otherwise noted.  PATIENT SURVEYS:  LEFS  Extreme difficulty/unable (0), Quite a bit of difficulty (1), Moderate difficulty (2), Little difficulty (3), No difficulty (4) Survey date:    Any of your usual work, housework or school activities 0  2. Usual hobbies, recreational or sporting activities 0  3. Getting into/out of the bath 1  4. Walking between rooms 1  5. Putting on socks/shoes 1  6. Squatting  0  7. Lifting an object, like a bag of groceries from the floor 1  8. Performing light activities around your home 1  9. Performing heavy activities around your home 0  10. Getting into/out of a car 1  11. Walking 2 blocks 0  12. Walking 1 mile 0  13. Going up/down 10 stairs (1 flight) 0  14. Standing for 1 hour 0  15.  sitting for 1 hour 2  16. Running on even ground 0  17. Running on uneven ground 0  18. Making sharp turns while running fast 0  19. Hopping  0  20. Rolling over in bed 2  Score total:  10/80     COGNITIVE STATUS: Within functional limits for tasks assessed   SENSATION: WFL  EDEMA:  Yes: 45 cm Rt, 41 Lt  GAIT: EVAL: arrived with bilat axillary crutches with brace locked in ext, foot placing on floor  Body Part #1 Knee  PALPATION: Eval: good patellar mobility  LOWER EXTREMITY ROM:      Active  Right eval   Knee flexion 60   Knee extension 0   Ankle dorsiflexion -4    (Blank rows = not tested)                                                                                                                                TREATMENT DATE:   8/2 EVAL Change bandages See HEP Gait with crutches   PATIENT EDUCATION:  Education details: Anatomy of condition, POC, HEP, exercise form/rationale  Person educated: Patient and Spouse Education method: Explanation, Demonstration, Tactile cues, Verbal cues, and Handouts Education comprehension: verbalized understanding, returned demonstration, verbal cues required, tactile cues required, and needs further education  HOME EXERCISE PROGRAM: Access Code: [email or web address] URL: [email or web address] Date: 10/22/2023 Prepared by: Harlene Cordon  Exercises - Long Sitting Quad Set  - 1 x daily - 7 x weekly - 3 sets - 10 reps - Supine Ankle Pumps in Elevation on Pillows  - 1 x daily - 7 x weekly - 3 sets - 10 reps - Seated Hamstring Stretch with Chair  - 1 x daily - 7 x weekly - 3 sets - 10 reps - Seated Heel Slide  - 1 x daily - 7 x weekly - 3 sets - 10 reps - Standing Gluteal Sets  - 1 x daily - 7 x weekly - 3 sets - 10 reps   ASSESSMENT:  CLINICAL IMPRESSION: Patient is a 48 y.o. F who was seen today for physical therapy evaluation and treatment for s/p Rt MPFL repair.     REHAB POTENTIAL: Good  CLINICAL DECISION MAKING: Stable/uncomplicated  EVALUATION COMPLEXITY: Low   GOALS: Goals reviewed with patient? Yes  SHORT TERM GOALS: Target date: 8/30  Knee ROM 0-90 comfortably Baseline: Goal status: INITIAL  2.  Sit<>stand from elevated chair without compensatory pattern.  Baseline:  Goal status: INITIAL  3.  Ambulation without AD, proper form Baseline:  Goal status: INITIAL    LONG TERM GOALS: Target date: POC date  Reciprocal stair navigation with good form and pain <=2/10 Baseline:   Goal status: INITIAL  2.  Strength 80% of contralateral leg via HHD testing Baseline:  Goal status: INITIAL  3.  Return to walking program for regular exercise Baseline:  Goal status: INITIAL  4.  Average knee pain with daily activities <=2/10 Baseline:  Goal status: INITIAL     PLAN:  PT FREQUENCY: 1-2x/week  PT DURATION: POC date  PLANNED INTERVENTIONS: 97164- PT Re-evaluation, 97750- Physical Performance Testing, 97110-Therapeutic exercises, 97530- Therapeutic activity, [id number]- Neuromuscular re-education, 97535- Self Care, 02859- Manual therapy, [phone]- Gait training, [phone]- Aquatic Therapy, Patient/Family education, Balance training, Stair training, Taping, Joint mobilization, Spinal mobilization, Scar mobilization, and Cryotherapy.  PLAN FOR NEXT SESSION: per protocol- [place]  PROTOCOL: [email or web address]=[email or web address]/[email or web address]   Harlene Cordon, PT, DPT 10/22/2023, 7:53 AM

## 2023-10-22 ENCOUNTER — Encounter (HOSPITAL_BASED_OUTPATIENT_CLINIC_OR_DEPARTMENT_OTHER): Payer: Self-pay | Admitting: Physical Therapy

## 2023-10-24 NOTE — Telephone Encounter (Unsigned)
 Copied from CRM #[id number]. Topic: Clinical - Medication Refill >> Oct 24, 2023  4:06 PM Jasmin G wrote: Medication: sertraline  (ZOLOFT ) 50 MG tablet  Has the patient contacted their pharmacy? Yes (Agent: If no, request that the patient contact the pharmacy for the refill. If patient does not wish to contact the pharmacy document the reason why and proceed with request.) (Agent: If yes, when and what did the pharmacy advise?)  This is the patient's preferred pharmacy:  [facility] - [place], [place] - [address] [place] [address] Phone: [phone] Fax: [phone]  Is this the correct pharmacy for this prescription? Yes If no, delete pharmacy and type the correct one.   Has the prescription been filled recently? Yes  Is the patient out of the medication? No  Has the patient been seen for an appointment in the last year OR does the patient have an upcoming appointment? Yes  Can we respond through MyChart? No  Agent: Please be advised that Rx refills may take up to 3 business days. We ask that you follow-up with your pharmacy.

## 2023-10-25 ENCOUNTER — Encounter (HOSPITAL_BASED_OUTPATIENT_CLINIC_OR_DEPARTMENT_OTHER): Payer: Self-pay

## 2023-10-25 ENCOUNTER — Ambulatory Visit (HOSPITAL_BASED_OUTPATIENT_CLINIC_OR_DEPARTMENT_OTHER)

## 2023-10-25 ENCOUNTER — Telehealth (HOSPITAL_BASED_OUTPATIENT_CLINIC_OR_DEPARTMENT_OTHER): Payer: Self-pay | Admitting: Physical Therapy

## 2023-10-25 DIAGNOSIS — R262 Difficulty in walking, not elsewhere classified: Secondary | ICD-10-CM

## 2023-10-25 DIAGNOSIS — M25361 Other instability, right knee: Secondary | ICD-10-CM | POA: Diagnosis not present

## 2023-10-25 DIAGNOSIS — M25561 Pain in right knee: Secondary | ICD-10-CM

## 2023-10-25 DIAGNOSIS — M25661 Stiffness of right knee, not elsewhere classified: Secondary | ICD-10-CM

## 2023-10-25 NOTE — Therapy (Signed)
 OUTPATIENT PHYSICAL THERAPY TREATMENT   Patient Name: Tracey [name] MRN: [id number] DOB:02-10-1976, 48 y.o., female Today's Date: 10/25/2023  END OF SESSION:  PT End of Session - 10/25/23 1608     Visit Number 2    Number of Visits 25    Date for PT Re-Evaluation 01/12/24    Authorization Type BCBS    PT Start Time 1520    PT Stop Time 1600    PT Time Calculation (min) 40 min    Activity Tolerance Patient tolerated treatment well    Behavior During Therapy WFL for tasks assessed/performed            Past Medical History:  Diagnosis Date   Anxiety    Bronchitis    Carpal tunnel syndrome on both sides    Constipation    Depression    Family history of adverse reaction to anesthesia    mother had problems waking up from surgery.   GERD (gastroesophageal reflux disease)    Gestational diabetes    Gestational diabetes mellitus    Normal 2 hr GTT postpartum   Headache    migraines   High cholesterol    Hypertension    Infertility, female    Lactose intolerance    Oligohydramnios antepartum 11/09/2017   Palpitations    Panic attack 2017   diagnosed 3 months ago. no meds presently   Reflux    did not filll prescription   S/P percutaneous patent foramen ovale closure 06/16/2020   s/p PFO occluder device with a a 25 MM AMPLATZER by Dr. Wonda   Stroke [facility])    Swelling    Vitamin D  deficiency    Past Surgical History:  Procedure Laterality Date   BREAST EXCISIONAL BIOPSY Right    cyst   BUBBLE STUDY  04/16/2020   Procedure: BUBBLE STUDY;  Surgeon: Raford Riggs, MD;  Location: [facility] ENDOSCOPY;  Service: Cardiovascular;;   DILATION AND EVACUATION N/A 07/26/2015   Procedure: DILATATION AND EVACUATION;  Surgeon: Carlin DELENA Centers, MD;  Location: [facility] ORS;  Service: Gynecology;  Laterality: N/A;   PATENT FORAMEN OVALE(PFO) CLOSURE N/A 06/16/2020   Procedure: PATENT FORAMEN OVALE (PFO) CLOSURE;  Surgeon: Wonda Sharper, MD;  Location: [facility] INVASIVE CV LAB;  Service:  Cardiovascular;  Laterality: N/A;   TEE WITHOUT CARDIOVERSION N/A 04/16/2020   Procedure: TRANSESOPHAGEAL ECHOCARDIOGRAM (TEE);  Surgeon: Raford Riggs, MD;  Location: [facility] ENDOSCOPY;  Service: Cardiovascular;  Laterality: N/A;   Patient Active Problem List   Diagnosis Date Noted   Acute pain of right knee 03/05/2023   Need for hepatitis C screening test 03/05/2023   Screening for colon cancer 03/05/2023   Need for immunization against influenza 03/05/2023   Minor neurocognitive disorder 08/03/2022   Mild vascular neurocognitive disorder 08/03/2022   Dyslipidemia, goal LDL below 70 07/04/2022   Tobacco abuse 07/04/2022   Mild episode of recurrent major depressive disorder (HCC) 07/04/2022   Encounter for general adult medical examination with abnormal findings 07/04/2022   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   Other hyperlipidemia 08/16/2021   Class 1 obesity with serious comorbidity and body mass index (BMI) of 33.0 to 33.9 in adult 08/16/2021   Abnormal screening mammogram 08/16/2021   Psychophysiological insomnia 07/08/2020   Chronic migraine without aura without status migrainosus, not intractable 07/08/2020   S/P percutaneous patent foramen ovale closure 06/16/2020   PFO (patent foramen ovale) - ROPE score 5 05/31/2020   Hypertension    Vitamin D  deficiency 06/07/2017    REFERRING PROVIDER:  Genelle Standing, MD     REFERRING DIAG:  [phone] (ICD-10-CM) - Patellar instability of right knee    PROCEDURE: 1.  Right knee medial patellofemoral ligament reconstruction 2.  Right knee arthroscopy with medial chondroplasty  Rationale for Evaluation and Treatment: Rehabilitation  THERAPY DIAG:  Acute pain of right knee  Stiffness of right knee, not elsewhere classified  Difficulty in walking, not elsewhere classified  ONSET DATE: DOS 10/18/23   SUBJECTIVE:                                                                                                                                                                                            SUBJECTIVE STATEMENT: Pt arrived with brace donned and bil crutches. She states I may be overding it. Repotrs she has been putting more weight on it when she walks and sometimes going without crutches.   PERTINENT HISTORY:  Anxiety, stroke  PAIN:  Are you having pain? Yes: NPRS scale: 5/10 Pain location: Rt knee Pain description: sore Aggravating factors: walking Relieving factors: ice, meds  PRECAUTIONS:  None  RED FLAGS: None   WEIGHT BEARING RESTRICTIONS:  WBAT  FALLS:  Has patient fallen in last 6 months? No  LIVING ENVIRONMENT: Stairs to basement- spiral  OCCUPATION:  claims  PLOF:  Independent  PATIENT GOALS:  Walk better, decrease pain, walk for exercise   OBJECTIVE:  Note: Objective measures were completed at Evaluation unless otherwise noted.  PATIENT SURVEYS:  LEFS  Extreme difficulty/unable (0), Quite a bit of difficulty (1), Moderate difficulty (2), Little difficulty (3), No difficulty (4) Survey date:    Any of your usual work, housework or school activities 0  2. Usual hobbies, recreational or sporting activities 0  3. Getting into/out of the bath 1  4. Walking between rooms 1  5. Putting on socks/shoes 1  6. Squatting  0  7. Lifting an object, like a bag of groceries from the floor 1  8. Performing light activities around your home 1  9. Performing heavy activities around your home 0  10. Getting into/out of a car 1  11. Walking 2 blocks 0  12. Walking 1 mile 0  13. Going up/down 10 stairs (1 flight) 0  14. Standing for 1 hour 0  15.  sitting for 1 hour 2  16. Running on even ground 0  17. Running on uneven ground 0  18. Making sharp turns while running fast 0  19. Hopping  0  20. Rolling over in bed 2  Score total:  10/80     COGNITIVE STATUS: Within functional limits for tasks assessed   SENSATION:  WFL  EDEMA:  Yes: 45 cm Rt, 41 Lt  GAIT: EVAL: arrived with  bilat axillary crutches with brace locked in ext, foot placing on floor   Body Part #1 Knee  PALPATION: Eval: good patellar mobility  LOWER EXTREMITY ROM:     Active  Right eval   Knee flexion 60   Knee extension 0   Ankle dorsiflexion -4    (Blank rows = not tested)                                                                                                                                TREATMENT DATE:   8/7 Bandage change Education on restrictions and protocol PROM within pain tolerance Quad sets 5-second hold x 3 minutes Reviewed brace fitting Reviewed weightbearing with crutches Reviewed bed mobility with contralateral LE assist   8/2 EVAL Change bandages See HEP Gait with crutches   PATIENT EDUCATION:  Education details: Anatomy of condition, POC, HEP, exercise form/rationale  Person educated: Patient and Spouse Education method: Explanation, Demonstration, Tactile cues, Verbal cues, and Handouts Education comprehension: verbalized understanding, returned demonstration, verbal cues required, tactile cues required, and needs further education  HOME EXERCISE PROGRAM: Access Code: [email or web address] URL: [email or web address] Date: 10/22/2023 Prepared by: Harlene Cordon  Exercises - Long Sitting Quad Set  - 1 x daily - 7 x weekly - 3 sets - 10 reps - Supine Ankle Pumps in Elevation on Pillows  - 1 x daily - 7 x weekly - 3 sets - 10 reps - Seated Hamstring Stretch with Chair  - 1 x daily - 7 x weekly - 3 sets - 10 reps - Seated Heel Slide  - 1 x daily - 7 x weekly - 3 sets - 10 reps - Standing Gluteal Sets  - 1 x daily - 7 x weekly - 3 sets - 10 reps   ASSESSMENT:  CLINICAL IMPRESSION: Reviewed precautions and protocol restrictions at this time. Instructed her to continue with bil crutches and brace use until otherwise instructed. Reviewed WB status. Changed dressing today. Mild dried drainage/bleeding present on gauze, but not abnormal in  amount or appearance. No signs of infection.  Discussed bed mobility as well as sleeping positions.  Patient instructed to continue with ice machine and elevation to manage swelling and pain.  Will continue to progress as tolerated per protocol.    REHAB POTENTIAL: Good  CLINICAL DECISION MAKING: Stable/uncomplicated  EVALUATION COMPLEXITY: Low   GOALS: Goals reviewed with patient? Yes  SHORT TERM GOALS: Target date: 8/30  Knee ROM 0-90 comfortably Baseline: Goal status: INITIAL  2.  Sit<>stand from elevated chair without compensatory pattern.  Baseline:  Goal status: INITIAL  3.  Ambulation without AD, proper form Baseline:  Goal status: INITIAL    LONG TERM GOALS: Target date: POC date  Reciprocal stair navigation with good form and pain <=2/10 Baseline:  Goal status: INITIAL  2.  Strength 80% of contralateral leg  via HHD testing Baseline:  Goal status: INITIAL  3.  Return to walking program for regular exercise Baseline:  Goal status: INITIAL  4.  Average knee pain with daily activities <=2/10 Baseline:  Goal status: INITIAL     PLAN:  PT FREQUENCY: 1-2x/week  PT DURATION: POC date  PLANNED INTERVENTIONS: 97164- PT Re-evaluation, 97750- Physical Performance Testing, 97110-Therapeutic exercises, 97530- Therapeutic activity, [id number]- Neuromuscular re-education, 97535- Self Care, 02859- Manual therapy, [phone]- Gait training, [phone]- Aquatic Therapy, Patient/Family education, Balance training, Stair training, Taping, Joint mobilization, Spinal mobilization, Scar mobilization, and Cryotherapy.  PLAN FOR NEXT SESSION: per protocol- [place]  PROTOCOL: [email or web address]/[email or web address]   Asberry FORBES Rodes, PTA 10/25/2023, 4:59 PM

## 2023-10-25 NOTE — Telephone Encounter (Signed)
 LVM regarding NS today. Advised of next appt time and requested that she review attendance policy.   Travion Ke C. Ambera Fedele PT, DPT 10/25/23 2:04 PM

## 2023-11-01 ENCOUNTER — Ambulatory Visit (INDEPENDENT_AMBULATORY_CARE_PROVIDER_SITE_OTHER): Admitting: Orthopaedic Surgery

## 2023-11-01 ENCOUNTER — Encounter (HOSPITAL_BASED_OUTPATIENT_CLINIC_OR_DEPARTMENT_OTHER): Payer: Self-pay

## 2023-11-01 ENCOUNTER — Encounter (HOSPITAL_BASED_OUTPATIENT_CLINIC_OR_DEPARTMENT_OTHER): Payer: Self-pay | Admitting: Orthopaedic Surgery

## 2023-11-01 ENCOUNTER — Other Ambulatory Visit (HOSPITAL_BASED_OUTPATIENT_CLINIC_OR_DEPARTMENT_OTHER): Payer: Self-pay | Admitting: Orthopaedic Surgery

## 2023-11-01 ENCOUNTER — Other Ambulatory Visit (HOSPITAL_BASED_OUTPATIENT_CLINIC_OR_DEPARTMENT_OTHER): Payer: Self-pay

## 2023-11-01 ENCOUNTER — Ambulatory Visit (HOSPITAL_BASED_OUTPATIENT_CLINIC_OR_DEPARTMENT_OTHER)

## 2023-11-01 DIAGNOSIS — R262 Difficulty in walking, not elsewhere classified: Secondary | ICD-10-CM

## 2023-11-01 DIAGNOSIS — M25661 Stiffness of right knee, not elsewhere classified: Secondary | ICD-10-CM

## 2023-11-01 DIAGNOSIS — M25361 Other instability, right knee: Secondary | ICD-10-CM | POA: Diagnosis not present

## 2023-11-01 DIAGNOSIS — M25561 Pain in right knee: Secondary | ICD-10-CM

## 2023-11-01 MED ORDER — IBUPROFEN 800 MG PO TABS: 800.0000 mg | ORAL_TABLET | Freq: Three times a day (TID) | ORAL | 2 refills | Status: AC

## 2023-11-01 NOTE — Progress Notes (Signed)
 Post Operative Evaluation    Procedure/Date of Surgery: Right knee MPFL reconstruction 7/31  Interval History:  Presents 2 weeks status post above procedure.  Overall she is doing extremely well.  Range of motion is coming along nicely.  She is working with physical therapy.  She is compliant with brace usage.   PMH/PSH/Family History/Social History/Meds/Allergies:    Past Medical History:  Diagnosis Date   Anxiety    Bronchitis    Carpal tunnel syndrome on both sides    Constipation    Depression    Family history of adverse reaction to anesthesia    mother had problems waking up from surgery.   GERD (gastroesophageal reflux disease)    Gestational diabetes    Gestational diabetes mellitus    Normal 2 hr GTT postpartum   Headache    migraines   High cholesterol    Hypertension    Infertility, female    Lactose intolerance    Oligohydramnios antepartum 11/09/2017   Palpitations    Panic attack 2017   diagnosed 3 months ago. no meds presently   Reflux    did not filll prescription   S/P percutaneous patent foramen ovale closure 06/16/2020   s/p PFO occluder device with a a 25 MM AMPLATZER by Dr. Wonda   Stroke [facility])    Swelling    Vitamin D  deficiency    Past Surgical History:  Procedure Laterality Date   BREAST EXCISIONAL BIOPSY Right    cyst   BUBBLE STUDY  04/16/2020   Procedure: BUBBLE STUDY;  Surgeon: Raford Riggs, MD;  Location: [facility] ENDOSCOPY;  Service: Cardiovascular;;   DILATION AND EVACUATION N/A 07/26/2015   Procedure: DILATATION AND EVACUATION;  Surgeon: Carlin DELENA Centers, MD;  Location: [facility] ORS;  Service: Gynecology;  Laterality: N/A;   PATENT FORAMEN OVALE(PFO) CLOSURE N/A 06/16/2020   Procedure: PATENT FORAMEN OVALE (PFO) CLOSURE;  Surgeon: Wonda Sharper, MD;  Location: [facility] INVASIVE CV LAB;  Service: Cardiovascular;  Laterality: N/A;   TEE WITHOUT CARDIOVERSION N/A 04/16/2020   Procedure: TRANSESOPHAGEAL  ECHOCARDIOGRAM (TEE);  Surgeon: Raford Riggs, MD;  Location: [facility] ENDOSCOPY;  Service: Cardiovascular;  Laterality: N/A;   Social History   Socioeconomic History   Marital status: Married    Spouse name: Not on file   Number of children: Not on file   Years of education: Not on file   Highest education level: Some college, no degree  Occupational History   Occupation: Not Working  Tobacco Use   Smoking status: Every Day    Current packs/day: 0.25    Types: Cigarettes   Smokeless tobacco: Never   Tobacco comments:    4 cig/day  Vaping Use   Vaping status: Never Used  Substance and Sexual Activity   Alcohol use: Yes    Comment: wine occ   Drug use: No   Sexual activity: Yes    Birth control/protection: I.U.D.  Other Topics Concern   Not on file  Social History Narrative   Lives at home with spouse and her 2 children   Right handed   Caffeine: 2 cups/day   Pt works   Social Drivers of Corporate investment banker Strain: Low Risk  (07/04/2022)   Overall Financial Resource Strain (CARDIA)    Difficulty of Paying Living Expenses: Not hard at all  Food Insecurity: No Food Insecurity (07/04/2022)   Hunger Vital Sign    Worried About Running Out of Food in the Last Year: Never true    Ran Out of Food in the Last Year: Never true  Transportation Needs: No Transportation Needs (07/04/2022)   PRAPARE - Administrator, Civil Service (Medical): No    Lack of Transportation (Non-Medical): No  Physical Activity: Insufficiently Active (07/04/2022)   Exercise Vital Sign    Days of Exercise per Week: 3 days    Minutes of Exercise per Session: 30 min  Stress: Stress Concern Present (07/04/2022)   [employer] of Occupational Health - Occupational Stress Questionnaire    Feeling of Stress : Very much  Social Connections: Moderately Integrated (07/04/2022)   Social Connection and Isolation Panel    Frequency of Communication with Friends and Family: More than three  times a week    Frequency of Social Gatherings with Friends and Family: Twice a week    Attends Religious Services: More than 4 times per year    Active Member of [employer] or Organizations: No    Attends Engineer, structural: Not on file    Marital Status: Married   Family History  Problem Relation Age of Onset   Hypertension Mother    Stroke Mother    Anxiety disorder Father    Depression Father    Alcoholism Father    Diabetes Maternal Aunt    Diabetes Maternal Uncle    Breast cancer Paternal Aunt    Stroke Maternal Grandmother    No Known Allergies Current Outpatient Medications  Medication Sig Dispense Refill   aspirin  81 MG EC tablet Take 81 mg by mouth daily. Swallow whole.     aspirin  EC 325 MG tablet Take 1 tablet (325 mg total) by mouth daily. 14 tablet 0   buPROPion  (WELLBUTRIN  XL) 150 MG 24 hr tablet Take 1 tablet (150 mg total) by mouth daily. 90 tablet 0   levonorgestrel  (MIRENA , 52 MG,) 20 MCG/24HR IUD 1 Intra Uterine Device (1 each total) by Intrauterine route once for 1 dose. 1 each 0   lisinopril  (ZESTRIL ) 20 MG tablet Take 1.5 tablets (30 mg total) by mouth daily. Patient has to keep yearly appointment for any future refills. 135 tablet 1   Multiple Vitamins-Minerals (MULTIVITAMIN WITH MINERALS) tablet Take 1 tablet by mouth daily.     oxyCODONE  (ROXICODONE ) 5 MG immediate release tablet Take 1 tablet (5 mg total) by mouth every 4 (four) hours as needed for severe pain (pain score 7-10) or breakthrough pain. 10 tablet 0   rosuvastatin  (CRESTOR ) 10 MG tablet Take 1 tablet (10 mg total) by mouth daily. Please keep your appointment on 7/14 with Cardiology in order to receive future refills. Thank You. 90 tablet 0   sertraline  (ZOLOFT ) 50 MG tablet Take 1 tablet (50 mg total) by mouth daily. 30 tablet 0   traZODone  (DESYREL ) 50 MG tablet Take 1-1.5 tablets (50-75 mg total) by mouth at bedtime. 45 tablet 11   varenicline  (CHANTIX ) 0.5 MG tablet Take 1 tablet (0.5 mg  total) by mouth 2 (two) times daily. 12 tablet 0   varenicline  (CHANTIX ) 1 MG tablet Take 1 tablet (1 mg total) by mouth 2 (two) times daily. 168 tablet 0   Vitamin D , Ergocalciferol , (DRISDOL ) 1.25 MG (50000 UNIT) CAPS capsule Take 1 capsule (50,000 Units total) by mouth every 7 (seven) days. 12 capsule 3   No current facility-administered medications for this visit.  No results found.  Review of Systems:   A ROS was performed including pertinent positives and negatives as documented in the HPI.   Musculoskeletal Exam:    There were no vitals taken for this visit.  Right knee well-appearing incisions are clean dry intact.  Range of motion is from 0 to 45 degrees.  There is no effusion.  No joint line tenderness  Imaging:      I personally reviewed and interpreted the radiographs.   Assessment:   2 weeks status post right knee MPFL reconstruction overall doing well.  At this time she will continue to work on strength and range of motion.  She will also continue to work on advancing her gait and weightbearing.  I will plan to see her back in 4 weeks for reassessment  Plan :    - Return to clinic 4 weeks for reassessment      I personally saw and evaluated the patient, and participated in the management and treatment plan.  Elspeth Parker, MD Attending Physician, Orthopedic Surgery  This document was dictated using Dragon voice recognition software. A reasonable attempt at proof reading has been made to minimize errors.

## 2023-11-01 NOTE — Telephone Encounter (Signed)
 Called patient. No answer. Left voicemail advising her refill was sent to her [facility].

## 2023-11-01 NOTE — Therapy (Signed)
 OUTPATIENT PHYSICAL THERAPY TREATMENT   Patient Name: Tracey [name] MRN: [id number] DOB:03/30/1975, 48 y.o., female Today's Date: 11/01/2023  END OF SESSION:  PT End of Session - 11/01/23 1033     Visit Number 3    Number of Visits 25    Date for PT Re-Evaluation 01/12/24    Authorization Type BCBS    PT Start Time [phone]   pt arrived late   PT Stop Time 1015    PT Time Calculation (min) 29 min    Activity Tolerance Patient tolerated treatment well    Behavior During Therapy WFL for tasks assessed/performed             Past Medical History:  Diagnosis Date   Anxiety    Bronchitis    Carpal tunnel syndrome on both sides    Constipation    Depression    Family history of adverse reaction to anesthesia    mother had problems waking up from surgery.   GERD (gastroesophageal reflux disease)    Gestational diabetes    Gestational diabetes mellitus    Normal 2 hr GTT postpartum   Headache    migraines   High cholesterol    Hypertension    Infertility, female    Lactose intolerance    Oligohydramnios antepartum 11/09/2017   Palpitations    Panic attack 2017   diagnosed 3 months ago. no meds presently   Reflux    did not filll prescription   S/P percutaneous patent foramen ovale closure 06/16/2020   s/p PFO occluder device with a a 25 MM AMPLATZER by Dr. Wonda   Stroke [facility])    Swelling    Vitamin D  deficiency    Past Surgical History:  Procedure Laterality Date   BREAST EXCISIONAL BIOPSY Right    cyst   BUBBLE STUDY  04/16/2020   Procedure: BUBBLE STUDY;  Surgeon: Raford Riggs, MD;  Location: [facility] ENDOSCOPY;  Service: Cardiovascular;;   DILATION AND EVACUATION N/A 07/26/2015   Procedure: DILATATION AND EVACUATION;  Surgeon: Carlin DELENA Centers, MD;  Location: [facility] ORS;  Service: Gynecology;  Laterality: N/A;   PATENT FORAMEN OVALE(PFO) CLOSURE N/A 06/16/2020   Procedure: PATENT FORAMEN OVALE (PFO) CLOSURE;  Surgeon: Wonda Sharper, MD;  Location: [facility] INVASIVE  CV LAB;  Service: Cardiovascular;  Laterality: N/A;   TEE WITHOUT CARDIOVERSION N/A 04/16/2020   Procedure: TRANSESOPHAGEAL ECHOCARDIOGRAM (TEE);  Surgeon: Raford Riggs, MD;  Location: [facility] ENDOSCOPY;  Service: Cardiovascular;  Laterality: N/A;   Patient Active Problem List   Diagnosis Date Noted   Acute pain of right knee 03/05/2023   Need for hepatitis C screening test 03/05/2023   Screening for colon cancer 03/05/2023   Need for immunization against influenza 03/05/2023   Minor neurocognitive disorder 08/03/2022   Mild vascular neurocognitive disorder 08/03/2022   Dyslipidemia, goal LDL below 70 07/04/2022   Tobacco abuse 07/04/2022   Mild episode of recurrent major depressive disorder (HCC) 07/04/2022   Encounter for general adult medical examination with abnormal findings 07/04/2022   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   Other hyperlipidemia 08/16/2021   Class 1 obesity with serious comorbidity and body mass index (BMI) of 33.0 to 33.9 in adult 08/16/2021   Abnormal screening mammogram 08/16/2021   Psychophysiological insomnia 07/08/2020   Chronic migraine without aura without status migrainosus, not intractable 07/08/2020   S/P percutaneous patent foramen ovale closure 06/16/2020   PFO (patent foramen ovale) - ROPE score 5 05/31/2020   Hypertension    Vitamin D  deficiency 06/07/2017  REFERRING PROVIDER:  Genelle Standing, MD     REFERRING DIAG:  [phone] (ICD-10-CM) - Patellar instability of right knee    PROCEDURE: 1.  Right knee medial patellofemoral ligament reconstruction 2.  Right knee arthroscopy with medial chondroplasty  Rationale for Evaluation and Treatment: Rehabilitation  THERAPY DIAG:  Acute pain of right knee  Stiffness of right knee, not elsewhere classified  Difficulty in walking, not elsewhere classified  ONSET DATE: DOS 10/18/23   SUBJECTIVE:                                                                                                                                                                                            SUBJECTIVE STATEMENT: Pt arrived with brace donned and bil crutches. I can't wait to drive again.   PERTINENT HISTORY:  Anxiety, stroke  PAIN:  Are you having pain? Yes: NPRS scale: 5/10 Pain location: Rt knee Pain description: sore Aggravating factors: walking Relieving factors: ice, meds  PRECAUTIONS:  None  RED FLAGS: None   WEIGHT BEARING RESTRICTIONS:  WBAT  FALLS:  Has patient fallen in last 6 months? No  LIVING ENVIRONMENT: Stairs to basement- spiral  OCCUPATION:  claims  PLOF:  Independent  PATIENT GOALS:  Walk better, decrease pain, walk for exercise   OBJECTIVE:  Note: Objective measures were completed at Evaluation unless otherwise noted.  PATIENT SURVEYS:  LEFS  Extreme difficulty/unable (0), Quite a bit of difficulty (1), Moderate difficulty (2), Little difficulty (3), No difficulty (4) Survey date:    Any of your usual work, housework or school activities 0  2. Usual hobbies, recreational or sporting activities 0  3. Getting into/out of the bath 1  4. Walking between rooms 1  5. Putting on socks/shoes 1  6. Squatting  0  7. Lifting an object, like a bag of groceries from the floor 1  8. Performing light activities around your home 1  9. Performing heavy activities around your home 0  10. Getting into/out of a car 1  11. Walking 2 blocks 0  12. Walking 1 mile 0  13. Going up/down 10 stairs (1 flight) 0  14. Standing for 1 hour 0  15.  sitting for 1 hour 2  16. Running on even ground 0  17. Running on uneven ground 0  18. Making sharp turns while running fast 0  19. Hopping  0  20. Rolling over in bed 2  Score total:  10/80     COGNITIVE STATUS: Within functional limits for tasks assessed   SENSATION: WFL  EDEMA:  Yes: 45 cm Rt, 41 Lt  GAIT: EVAL: arrived with  bilat axillary crutches with brace locked in ext, foot placing on floor   Body Part  #1 Knee  PALPATION: Eval: good patellar mobility  LOWER EXTREMITY ROM:     Active  Right eval   Knee flexion 60   Knee extension 0   Ankle dorsiflexion -4    (Blank rows = not tested)                                                                                                                                TREATMENT DATE:   8/14 Quad sets 5 x10 NMES with quad set (50%DC, 25mA, 10/10 5sec ramp) x36min PROM KTC with red physioball x69min   8/7 Bandage change Education on restrictions and protocol PROM within pain tolerance Quad sets 5-second hold x 3 minutes Reviewed brace fitting Reviewed weightbearing with crutches Reviewed bed mobility with contralateral LE assist   8/2 EVAL Change bandages See HEP Gait with crutches   PATIENT EDUCATION:  Education details: Anatomy of condition, POC, HEP, exercise form/rationale  Person educated: Patient and Spouse Education method: Explanation, Demonstration, Tactile cues, Verbal cues, and Handouts Education comprehension: verbalized understanding, returned demonstration, verbal cues required, tactile cues required, and needs further education  HOME EXERCISE PROGRAM: Access Code: [email or web address] URL: [email or web address] Date: 10/22/2023 Prepared by: Harlene Cordon  Exercises - Long Sitting Quad Set  - 1 x daily - 7 x weekly - 3 sets - 10 reps - Supine Ankle Pumps in Elevation on Pillows  - 1 x daily - 7 x weekly - 3 sets - 10 reps - Seated Hamstring Stretch with Chair  - 1 x daily - 7 x weekly - 3 sets - 10 reps - Seated Heel Slide  - 1 x daily - 7 x weekly - 3 sets - 10 reps - Standing Gluteal Sets  - 1 x daily - 7 x weekly - 3 sets - 10 reps   ASSESSMENT:  CLINICAL IMPRESSION: Pt with limited with quad facilitation so trialled NMES today with improved activation. Initiated AAROM knee flexion with physioball which she tolerated well overall, though challenging. Reviewed activity  limitations/restrictions at this time. Tx time limited due to late arrival. Pt has f/u with MD following today's session.     REHAB POTENTIAL: Good  CLINICAL DECISION MAKING: Stable/uncomplicated  EVALUATION COMPLEXITY: Low   GOALS: Goals reviewed with patient? Yes  SHORT TERM GOALS: Target date: 8/30  Knee ROM 0-90 comfortably Baseline: Goal status: INITIAL  2.  Sit<>stand from elevated chair without compensatory pattern.  Baseline:  Goal status: INITIAL  3.  Ambulation without AD, proper form Baseline:  Goal status: INITIAL    LONG TERM GOALS: Target date: POC date  Reciprocal stair navigation with good form and pain <=2/10 Baseline:  Goal status: INITIAL  2.  Strength 80% of contralateral leg via HHD testing Baseline:  Goal status: INITIAL  3.  Return to walking program for regular exercise Baseline:  Goal  status: INITIAL  4.  Average knee pain with daily activities <=2/10 Baseline:  Goal status: INITIAL     PLAN:  PT FREQUENCY: 1-2x/week  PT DURATION: POC date  PLANNED INTERVENTIONS: 97164- PT Re-evaluation, 97750- Physical Performance Testing, 97110-Therapeutic exercises, 97530- Therapeutic activity, [id number]- Neuromuscular re-education, 97535- Self Care, [id number]- Manual therapy, [phone]- Gait training, [phone]- Aquatic Therapy, Patient/Family education, Balance training, Stair training, Taping, Joint mobilization, Spinal mobilization, Scar mobilization, and Cryotherapy.  PLAN FOR NEXT SESSION: per protocol- [place]  PROTOCOL: [email or web address]   Asberry FORBES Rodes, PTA 11/01/2023, 10:39 AM

## 2023-11-03 ENCOUNTER — Ambulatory Visit (HOSPITAL_BASED_OUTPATIENT_CLINIC_OR_DEPARTMENT_OTHER)

## 2023-11-03 ENCOUNTER — Telehealth (HOSPITAL_BASED_OUTPATIENT_CLINIC_OR_DEPARTMENT_OTHER): Payer: Self-pay

## 2023-11-03 NOTE — Therapy (Incomplete)
 OUTPATIENT PHYSICAL THERAPY TREATMENT   Patient Name: Tracey [name] MRN: [id number] DOB:04-21-1975, 48 y.o., female Today's Date: 11/03/2023  END OF SESSION:       Past Medical History:  Diagnosis Date   Anxiety    Bronchitis    Carpal tunnel syndrome on both sides    Constipation    Depression    Family history of adverse reaction to anesthesia    mother had problems waking up from surgery.   GERD (gastroesophageal reflux disease)    Gestational diabetes    Gestational diabetes mellitus    Normal 2 hr GTT postpartum   Headache    migraines   High cholesterol    Hypertension    Infertility, female    Lactose intolerance    Oligohydramnios antepartum 11/09/2017   Palpitations    Panic attack 2017   diagnosed 3 months ago. no meds presently   Reflux    did not filll prescription   S/P percutaneous patent foramen ovale closure 06/16/2020   s/p PFO occluder device with a a 25 MM AMPLATZER by Dr. Wonda   Stroke [facility])    Swelling    Vitamin D  deficiency    Past Surgical History:  Procedure Laterality Date   BREAST EXCISIONAL BIOPSY Right    cyst   BUBBLE STUDY  04/16/2020   Procedure: BUBBLE STUDY;  Surgeon: Raford Riggs, MD;  Location: [facility] ENDOSCOPY;  Service: Cardiovascular;;   DILATION AND EVACUATION N/A 07/26/2015   Procedure: DILATATION AND EVACUATION;  Surgeon: Carlin DELENA Centers, MD;  Location: [facility] ORS;  Service: Gynecology;  Laterality: N/A;   PATENT FORAMEN OVALE(PFO) CLOSURE N/A 06/16/2020   Procedure: PATENT FORAMEN OVALE (PFO) CLOSURE;  Surgeon: Wonda Sharper, MD;  Location: [facility] INVASIVE CV LAB;  Service: Cardiovascular;  Laterality: N/A;   TEE WITHOUT CARDIOVERSION N/A 04/16/2020   Procedure: TRANSESOPHAGEAL ECHOCARDIOGRAM (TEE);  Surgeon: Raford Riggs, MD;  Location: [facility] ENDOSCOPY;  Service: Cardiovascular;  Laterality: N/A;   Patient Active Problem List   Diagnosis Date Noted   Acute pain of right knee 03/05/2023   Need for hepatitis C  screening test 03/05/2023   Screening for colon cancer 03/05/2023   Need for immunization against influenza 03/05/2023   Minor neurocognitive disorder 08/03/2022   Mild vascular neurocognitive disorder 08/03/2022   Dyslipidemia, goal LDL below 70 07/04/2022   Tobacco abuse 07/04/2022   Mild episode of recurrent major depressive disorder (HCC) 07/04/2022   Encounter for general adult medical examination with abnormal findings 07/04/2022   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   Other hyperlipidemia 08/16/2021   Class 1 obesity with serious comorbidity and body mass index (BMI) of 33.0 to 33.9 in adult 08/16/2021   Abnormal screening mammogram 08/16/2021   Psychophysiological insomnia 07/08/2020   Chronic migraine without aura without status migrainosus, not intractable 07/08/2020   S/P percutaneous patent foramen ovale closure 06/16/2020   PFO (patent foramen ovale) - ROPE score 5 05/31/2020   Hypertension    Vitamin D  deficiency 06/07/2017    REFERRING PROVIDER:  Genelle Standing, MD     REFERRING DIAG:  M25.361 (ICD-10-CM) - Patellar instability of right knee    PROCEDURE: 1.  Right knee medial patellofemoral ligament reconstruction 2.  Right knee arthroscopy with medial chondroplasty  Rationale for Evaluation and Treatment: Rehabilitation  THERAPY DIAG:  No diagnosis found.  ONSET DATE: DOS 10/18/23   SUBJECTIVE:  SUBJECTIVE STATEMENT: Pt arrived with brace donned and bil crutches. I can't wait to drive again.   PERTINENT HISTORY:  Anxiety, stroke  PAIN:  Are you having pain? Yes: NPRS scale: 5/10 Pain location: Rt knee Pain description: sore Aggravating factors: walking Relieving factors: ice, meds  PRECAUTIONS:  None  RED FLAGS: None   WEIGHT BEARING RESTRICTIONS:   WBAT  FALLS:  Has patient fallen in last 6 months? No  LIVING ENVIRONMENT: Stairs to basement- spiral  OCCUPATION:  claims  PLOF:  Independent  PATIENT GOALS:  Walk better, decrease pain, walk for exercise   OBJECTIVE:  Note: Objective measures were completed at Evaluation unless otherwise noted.  PATIENT SURVEYS:  LEFS  Extreme difficulty/unable (0), Quite a bit of difficulty (1), Moderate difficulty (2), Little difficulty (3), No difficulty (4) Survey date:    Any of your usual work, housework or school activities 0  2. Usual hobbies, recreational or sporting activities 0  3. Getting into/out of the bath 1  4. Walking between rooms 1  5. Putting on socks/shoes 1  6. Squatting  0  7. Lifting an object, like a bag of groceries from the floor 1  8. Performing light activities around your home 1  9. Performing heavy activities around your home 0  10. Getting into/out of a car 1  11. Walking 2 blocks 0  12. Walking 1 mile 0  13. Going up/down 10 stairs (1 flight) 0  14. Standing for 1 hour 0  15.  sitting for 1 hour 2  16. Running on even ground 0  17. Running on uneven ground 0  18. Making sharp turns while running fast 0  19. Hopping  0  20. Rolling over in bed 2  Score total:  10/80     COGNITIVE STATUS: Within functional limits for tasks assessed   SENSATION: WFL  EDEMA:  Yes: 45 cm Rt, 41 Lt  GAIT: EVAL: arrived with bilat axillary crutches with brace locked in ext, foot placing on floor   Body Part #1 Knee  PALPATION: Eval: good patellar mobility  LOWER EXTREMITY ROM:     Active  Right eval   Knee flexion 60   Knee extension 0   Ankle dorsiflexion -4    (Blank rows = not tested)                                                                                                                                TREATMENT DATE:   8/14 Quad sets 5 x10 NMES with quad set (50%DC, 25mA, 10/10 5sec ramp) x75min PROM KTC with red physioball  x50min  8/14 Quad sets 5 x10 NMES with quad set (50%DC, 25mA, 10/10 5sec ramp) x40min PROM KTC with red physioball x67min   8/7 Bandage change Education on restrictions and protocol PROM within pain tolerance Quad sets 5-second hold x 3 minutes Reviewed brace fitting Reviewed weightbearing with crutches Reviewed bed mobility with  contralateral LE assist   8/2 EVAL Change bandages See HEP Gait with crutches   PATIENT EDUCATION:  Education details: Anatomy of condition, POC, HEP, exercise form/rationale  Person educated: Patient and Spouse Education method: Explanation, Demonstration, Tactile cues, Verbal cues, and Handouts Education comprehension: verbalized understanding, returned demonstration, verbal cues required, tactile cues required, and needs further education  HOME EXERCISE PROGRAM: Access Code: [email or web address] URL: [email or web address] Date: 10/22/2023 Prepared by: Harlene Cordon  Exercises - Long Sitting Quad Set  - 1 x daily - 7 x weekly - 3 sets - 10 reps - Supine Ankle Pumps in Elevation on Pillows  - 1 x daily - 7 x weekly - 3 sets - 10 reps - Seated Hamstring Stretch with Chair  - 1 x daily - 7 x weekly - 3 sets - 10 reps - Seated Heel Slide  - 1 x daily - 7 x weekly - 3 sets - 10 reps - Standing Gluteal Sets  - 1 x daily - 7 x weekly - 3 sets - 10 reps   ASSESSMENT:  CLINICAL IMPRESSION: Pt with limited with quad facilitation so trialled NMES today with improved activation. Initiated AAROM knee flexion with physioball which she tolerated well overall, though challenging. Reviewed activity limitations/restrictions at this time. Tx time limited due to late arrival. Pt has f/u with MD following today's session.     REHAB POTENTIAL: Good  CLINICAL DECISION MAKING: Stable/uncomplicated  EVALUATION COMPLEXITY: Low   GOALS: Goals reviewed with patient? Yes  SHORT TERM GOALS: Target date: 8/30  Knee ROM 0-90  comfortably Baseline: Goal status: INITIAL  2.  Sit<>stand from elevated chair without compensatory pattern.  Baseline:  Goal status: INITIAL  3.  Ambulation without AD, proper form Baseline:  Goal status: INITIAL    LONG TERM GOALS: Target date: POC date  Reciprocal stair navigation with good form and pain <=2/10 Baseline:  Goal status: INITIAL  2.  Strength 80% of contralateral leg via HHD testing Baseline:  Goal status: INITIAL  3.  Return to walking program for regular exercise Baseline:  Goal status: INITIAL  4.  Average knee pain with daily activities <=2/10 Baseline:  Goal status: INITIAL     PLAN:  PT FREQUENCY: 1-2x/week  PT DURATION: POC date  PLANNED INTERVENTIONS: 97164- PT Re-evaluation, 97750- Physical Performance Testing, 97110-Therapeutic exercises, 97530- Therapeutic activity, [id number]- Neuromuscular re-education, 97535- Self Care, 02859- Manual therapy, [phone]- Gait training, [phone]- Aquatic Therapy, Patient/Family education, Balance training, Stair training, Taping, Joint mobilization, Spinal mobilization, Scar mobilization, and Cryotherapy.  PLAN FOR NEXT SESSION: per protocol- [place]  PROTOCOL: [email or web address]?la=en%3C/a   Asberry FORBES Rodes, PTA 11/03/2023, 10:38 AM

## 2023-11-03 NOTE — Telephone Encounter (Signed)
 Called pt and left VM regarding missed appt today. Reminded of next scheduled visit.

## 2023-11-08 ENCOUNTER — Encounter (HOSPITAL_BASED_OUTPATIENT_CLINIC_OR_DEPARTMENT_OTHER): Payer: Self-pay

## 2023-11-08 ENCOUNTER — Ambulatory Visit (HOSPITAL_BASED_OUTPATIENT_CLINIC_OR_DEPARTMENT_OTHER)

## 2023-11-08 DIAGNOSIS — M25361 Other instability, right knee: Secondary | ICD-10-CM | POA: Diagnosis not present

## 2023-11-08 DIAGNOSIS — M25661 Stiffness of right knee, not elsewhere classified: Secondary | ICD-10-CM

## 2023-11-08 DIAGNOSIS — R262 Difficulty in walking, not elsewhere classified: Secondary | ICD-10-CM

## 2023-11-08 DIAGNOSIS — M25561 Pain in right knee: Secondary | ICD-10-CM

## 2023-11-08 NOTE — Therapy (Signed)
 OUTPATIENT PHYSICAL THERAPY TREATMENT   Patient Name: Tracey [name] MRN: [id number] DOB:10-26-1975, 49 y.o., female Today's Date: 11/08/2023  END OF SESSION:  PT End of Session - 11/08/23 1539     Visit Number 4    Number of Visits 25    Date for PT Re-Evaluation 01/12/24    Authorization Type [employer]    PT Start Time 1528   pt arrived late   PT Stop Time 1606    PT Time Calculation (min) 38 min    Activity Tolerance Patient tolerated treatment well    Behavior During Therapy WFL for tasks assessed/performed              Past Medical History:  Diagnosis Date   Anxiety    Bronchitis    Carpal tunnel syndrome on both sides    Constipation    Depression    Family history of adverse reaction to anesthesia    mother had problems waking up from surgery.   GERD (gastroesophageal reflux disease)    Gestational diabetes    Gestational diabetes mellitus    Normal 2 hr GTT postpartum   Headache    migraines   High cholesterol    Hypertension    Infertility, female    Lactose intolerance    Oligohydramnios antepartum 11/09/2017   Palpitations    Panic attack 2017   diagnosed 3 months ago. no meds presently   Reflux    did not filll prescription   S/P percutaneous patent foramen ovale closure 06/16/2020   s/p PFO occluder device with a a 25 MM AMPLATZER by Dr. Wonda   Stroke [facility])    Swelling    Vitamin D  deficiency    Past Surgical History:  Procedure Laterality Date   BREAST EXCISIONAL BIOPSY Right    cyst   BUBBLE STUDY  04/16/2020   Procedure: BUBBLE STUDY;  Surgeon: Raford Riggs, MD;  Location: [facility] ENDOSCOPY;  Service: Cardiovascular;;   DILATION AND EVACUATION N/A 07/26/2015   Procedure: DILATATION AND EVACUATION;  Surgeon: Carlin DELENA Centers, MD;  Location: [facility] ORS;  Service: Gynecology;  Laterality: N/A;   PATENT FORAMEN OVALE(PFO) CLOSURE N/A 06/16/2020   Procedure: PATENT FORAMEN OVALE (PFO) CLOSURE;  Surgeon: Wonda Sharper, MD;  Location: [facility]  INVASIVE CV LAB;  Service: Cardiovascular;  Laterality: N/A;   TEE WITHOUT CARDIOVERSION N/A 04/16/2020   Procedure: TRANSESOPHAGEAL ECHOCARDIOGRAM (TEE);  Surgeon: Raford Riggs, MD;  Location: [facility] ENDOSCOPY;  Service: Cardiovascular;  Laterality: N/A;   Patient Active Problem List   Diagnosis Date Noted   Acute pain of right knee 03/05/2023   Need for hepatitis C screening test 03/05/2023   Screening for colon cancer 03/05/2023   Need for immunization against influenza 03/05/2023   Minor neurocognitive disorder 08/03/2022   Mild vascular neurocognitive disorder 08/03/2022   Dyslipidemia, goal LDL below 70 07/04/2022   Tobacco abuse 07/04/2022   Mild episode of recurrent major depressive disorder (HCC) 07/04/2022   Encounter for general adult medical examination with abnormal findings 07/04/2022   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   Other hyperlipidemia 08/16/2021   Class 1 obesity with serious comorbidity and body mass index (BMI) of 33.0 to 33.9 in adult 08/16/2021   Abnormal screening mammogram 08/16/2021   Psychophysiological insomnia 07/08/2020   Chronic migraine without aura without status migrainosus, not intractable 07/08/2020   S/P percutaneous patent foramen ovale closure 06/16/2020   PFO (patent foramen ovale) - ROPE score 5 05/31/2020   Hypertension    Vitamin D  deficiency 06/07/2017  REFERRING PROVIDER:  Genelle Standing, MD     REFERRING DIAG:  [phone] (ICD-10-CM) - Patellar instability of right knee    PROCEDURE: 1.  Right knee medial patellofemoral ligament reconstruction 2.  Right knee arthroscopy with medial chondroplasty  Rationale for Evaluation and Treatment: Rehabilitation  THERAPY DIAG:  Stiffness of right knee, not elsewhere classified  Acute pain of right knee  Difficulty in walking, not elsewhere classified  ONSET DATE: DOS 10/18/23   SUBJECTIVE:                                                                                                                                                                                            SUBJECTIVE STATEMENT: Pt arrives with brace donned and no crutches. Trying to bend it more at home, but it's still tight. He said to try without the brace at home. She feels she can pick up her leg better.  PERTINENT HISTORY:  Anxiety, stroke  PAIN:  Are you having pain? Yes: NPRS scale: 2/10 Pain location: Rt knee Pain description: sore Aggravating factors: walking Relieving factors: ice, meds  PRECAUTIONS:  None  RED FLAGS: None   WEIGHT BEARING RESTRICTIONS:  WBAT  FALLS:  Has patient fallen in last 6 months? No  LIVING ENVIRONMENT: Stairs to basement- spiral  OCCUPATION:  claims  PLOF:  Independent  PATIENT GOALS:  Walk better, decrease pain, walk for exercise   OBJECTIVE:  Note: Objective measures were completed at Evaluation unless otherwise noted.  PATIENT SURVEYS:  LEFS  Extreme difficulty/unable (0), Quite a bit of difficulty (1), Moderate difficulty (2), Little difficulty (3), No difficulty (4) Survey date:    Any of your usual work, housework or school activities 0  2. Usual hobbies, recreational or sporting activities 0  3. Getting into/out of the bath 1  4. Walking between rooms 1  5. Putting on socks/shoes 1  6. Squatting  0  7. Lifting an object, like a bag of groceries from the floor 1  8. Performing light activities around your home 1  9. Performing heavy activities around your home 0  10. Getting into/out of a car 1  11. Walking 2 blocks 0  12. Walking 1 mile 0  13. Going up/down 10 stairs (1 flight) 0  14. Standing for 1 hour 0  15.  sitting for 1 hour 2  16. Running on even ground 0  17. Running on uneven ground 0  18. Making sharp turns while running fast 0  19. Hopping  0  20. Rolling over in bed 2  Score total:  10/80     COGNITIVE STATUS: Within functional  limits for tasks assessed   SENSATION: WFL  EDEMA:  Yes: 45 cm Rt, 41  Lt  GAIT: EVAL: arrived with bilat axillary crutches with brace locked in ext, foot placing on floor   Body Part #1 Knee  PALPATION: Eval: good patellar mobility  LOWER EXTREMITY ROM:     Active  Right eval   Knee flexion 60   Knee extension 0   Ankle dorsiflexion -4    (Blank rows = not tested)                                                                                                                                TREATMENT DATE:    8/21 Quad sets Passive flexion  NMES with with quad set (50%DC, 25mA, 10/10 5sec ramp) x86min KTC with red physioball SLR with assist from PTA        8/14 Quad sets 5 x10 NMES with quad set (50%DC, 25mA, 10/10 5sec ramp) x31min PROM KTC with red physioball x41min   8/7 Bandage change Education on restrictions and protocol PROM within pain tolerance Quad sets 5-second hold x 3 minutes Reviewed brace fitting Reviewed weightbearing with crutches Reviewed bed mobility with contralateral LE assist   8/2 EVAL Change bandages See HEP Gait with crutches   PATIENT EDUCATION:  Education details: Anatomy of condition, POC, HEP, exercise form/rationale  Person educated: Patient and Spouse Education method: Explanation, Demonstration, Tactile cues, Verbal cues, and Handouts Education comprehension: verbalized understanding, returned demonstration, verbal cues required, tactile cues required, and needs further education  HOME EXERCISE PROGRAM: Access Code: [email or web address] URL: [email or web address] Date: 10/22/2023 Prepared by: Harlene Cordon  Exercises - Long Sitting Quad Set  - 1 x daily - 7 x weekly - 3 sets - 10 reps - Supine Ankle Pumps in Elevation on Pillows  - 1 x daily - 7 x weekly - 3 sets - 10 reps - Seated Hamstring Stretch with Chair  - 1 x daily - 7 x weekly - 3 sets - 10 reps - Seated Heel Slide  - 1 x daily - 7 x weekly - 3 sets - 10 reps - Standing Gluteal Sets  - 1 x daily - 7 x weekly - 3  sets - 10 reps   ASSESSMENT:  CLINICAL IMPRESSION: Pt improving with quad activation, though still a large deficit observed. Continued with use of NMES to improve facilitation. Worked on passive and AAROM knee flexion with pt about to achieve ~90deg. Improved ability to lift R LE independently with transfers. Advised pt continue with brace locked in extension until quad control improves. Instructed pt to continue with use of ice for management of pain/soreness. Will continue to progress per protocol.     REHAB POTENTIAL: Good  CLINICAL DECISION MAKING: Stable/uncomplicated  EVALUATION COMPLEXITY: Low   GOALS: Goals reviewed with patient? Yes  SHORT TERM GOALS: Target date: 8/30  Knee ROM 0-90 comfortably Baseline: Goal status: INITIAL  2.  Sit<>stand from elevated chair without compensatory pattern.  Baseline:  Goal status: INITIAL  3.  Ambulation without AD, proper form Baseline:  Goal status: INITIAL    LONG TERM GOALS: Target date: POC date  Reciprocal stair navigation with good form and pain <=2/10 Baseline:  Goal status: INITIAL  2.  Strength 80% of contralateral leg via HHD testing Baseline:  Goal status: INITIAL  3.  Return to walking program for regular exercise Baseline:  Goal status: INITIAL  4.  Average knee pain with daily activities <=2/10 Baseline:  Goal status: INITIAL     PLAN:  PT FREQUENCY: 1-2x/week  PT DURATION: POC date  PLANNED INTERVENTIONS: 97164- PT Re-evaluation, 97750- Physical Performance Testing, 97110-Therapeutic exercises, 97530- Therapeutic activity, [id number]- Neuromuscular re-education, 97535- Self Care, 02859- Manual therapy, [phone]- Gait training, [phone]- Aquatic Therapy, Patient/Family education, Balance training, Stair training, Taping, Joint mobilization, Spinal mobilization, Scar mobilization, and Cryotherapy.  PLAN FOR NEXT SESSION: per protocol- [place]  PROTOCOL:  [email or web address]?la=en%3C/a   Asberry FORBES Rodes, PTA 11/08/2023, 4:17 PM

## 2023-11-12 ENCOUNTER — Encounter: Attending: Psychology | Admitting: Psychology

## 2023-11-12 ENCOUNTER — Encounter (HOSPITAL_BASED_OUTPATIENT_CLINIC_OR_DEPARTMENT_OTHER): Payer: Self-pay | Admitting: Orthopaedic Surgery

## 2023-11-13 ENCOUNTER — Encounter (HOSPITAL_BASED_OUTPATIENT_CLINIC_OR_DEPARTMENT_OTHER): Payer: Self-pay | Admitting: Family

## 2023-11-15 ENCOUNTER — Ambulatory Visit (HOSPITAL_BASED_OUTPATIENT_CLINIC_OR_DEPARTMENT_OTHER)

## 2023-11-15 ENCOUNTER — Encounter (HOSPITAL_BASED_OUTPATIENT_CLINIC_OR_DEPARTMENT_OTHER): Payer: Self-pay

## 2023-11-15 DIAGNOSIS — M25361 Other instability, right knee: Secondary | ICD-10-CM | POA: Diagnosis not present

## 2023-11-15 DIAGNOSIS — M25561 Pain in right knee: Secondary | ICD-10-CM

## 2023-11-15 DIAGNOSIS — M25661 Stiffness of right knee, not elsewhere classified: Secondary | ICD-10-CM

## 2023-11-15 DIAGNOSIS — R262 Difficulty in walking, not elsewhere classified: Secondary | ICD-10-CM

## 2023-11-15 NOTE — Therapy (Signed)
 OUTPATIENT PHYSICAL THERAPY TREATMENT   Patient Name: Tracey [name] MRN: [id number] DOB:1975/07/11, 48 y.o., female Today's Date: 11/15/2023  END OF SESSION:  PT End of Session - 11/15/23 1627     Visit Number 5    Number of Visits 25    Date for PT Re-Evaluation 01/12/24    Authorization Type BCBS    PT Start Time 1519    PT Stop Time 1557    PT Time Calculation (min) 38 min    Activity Tolerance Patient tolerated treatment well    Behavior During Therapy WFL for tasks assessed/performed               Past Medical History:  Diagnosis Date   Anxiety    Bronchitis    Carpal tunnel syndrome on both sides    Constipation    Depression    Family history of adverse reaction to anesthesia    mother had problems waking up from surgery.   GERD (gastroesophageal reflux disease)    Gestational diabetes    Gestational diabetes mellitus    Normal 2 hr GTT postpartum   Headache    migraines   High cholesterol    Hypertension    Infertility, female    Lactose intolerance    Oligohydramnios antepartum 11/09/2017   Palpitations    Panic attack 2017   diagnosed 3 months ago. no meds presently   Reflux    did not filll prescription   S/P percutaneous patent foramen ovale closure 06/16/2020   s/p PFO occluder device with a a 25 MM AMPLATZER by Dr. Wonda   Stroke [facility])    Swelling    Vitamin D  deficiency    Past Surgical History:  Procedure Laterality Date   BREAST EXCISIONAL BIOPSY Right    cyst   BUBBLE STUDY  04/16/2020   Procedure: BUBBLE STUDY;  Surgeon: Raford Riggs, MD;  Location: [facility] ENDOSCOPY;  Service: Cardiovascular;;   DILATION AND EVACUATION N/A 07/26/2015   Procedure: DILATATION AND EVACUATION;  Surgeon: Carlin DELENA Centers, MD;  Location: [facility] ORS;  Service: Gynecology;  Laterality: N/A;   PATENT FORAMEN OVALE(PFO) CLOSURE N/A 06/16/2020   Procedure: PATENT FORAMEN OVALE (PFO) CLOSURE;  Surgeon: Wonda Sharper, MD;  Location: [facility] INVASIVE CV LAB;   Service: Cardiovascular;  Laterality: N/A;   TEE WITHOUT CARDIOVERSION N/A 04/16/2020   Procedure: TRANSESOPHAGEAL ECHOCARDIOGRAM (TEE);  Surgeon: Raford Riggs, MD;  Location: [facility] ENDOSCOPY;  Service: Cardiovascular;  Laterality: N/A;   Patient Active Problem List   Diagnosis Date Noted   Acute pain of right knee 03/05/2023   Need for hepatitis C screening test 03/05/2023   Screening for colon cancer 03/05/2023   Need for immunization against influenza 03/05/2023   Minor neurocognitive disorder 08/03/2022   Mild vascular neurocognitive disorder 08/03/2022   Dyslipidemia, goal LDL below 70 07/04/2022   Tobacco abuse 07/04/2022   Mild episode of recurrent major depressive disorder (HCC) 07/04/2022   Encounter for general adult medical examination with abnormal findings 07/04/2022   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   Other hyperlipidemia 08/16/2021   Class 1 obesity with serious comorbidity and body mass index (BMI) of 33.0 to 33.9 in adult 08/16/2021   Abnormal screening mammogram 08/16/2021   Psychophysiological insomnia 07/08/2020   Chronic migraine without aura without status migrainosus, not intractable 07/08/2020   S/P percutaneous patent foramen ovale closure 06/16/2020   PFO (patent foramen ovale) - ROPE score 5 05/31/2020   Hypertension    Vitamin D  deficiency 06/07/2017  REFERRING PROVIDER:  Genelle Standing, MD     REFERRING DIAG:  [phone] (ICD-10-CM) - Patellar instability of right knee    PROCEDURE: 1.  Right knee medial patellofemoral ligament reconstruction 2.  Right knee arthroscopy with medial chondroplasty  Rationale for Evaluation and Treatment: Rehabilitation  THERAPY DIAG:  Stiffness of right knee, not elsewhere classified  Acute pain of right knee  Difficulty in walking, not elsewhere classified  ONSET DATE: DOS 10/18/23   SUBJECTIVE:                                                                                                                                                                                            SUBJECTIVE STATEMENT: Patient reports improving with ambulation and with raising right leg.  Patient reports she has been trying to walk without brace in her house.  Still having pain at incision sites and reports hypersensitivity around incision areas.  PERTINENT HISTORY:  Anxiety, stroke  PAIN:  Are you having pain? Yes: NPRS scale: 2/10 Pain location: Rt knee Pain description: sore Aggravating factors: walking Relieving factors: ice, meds  PRECAUTIONS:  None  RED FLAGS: None   WEIGHT BEARING RESTRICTIONS:  WBAT  FALLS:  Has patient fallen in last 6 months? No  LIVING ENVIRONMENT: Stairs to basement- spiral  OCCUPATION:  claims  PLOF:  Independent  PATIENT GOALS:  Walk better, decrease pain, walk for exercise   OBJECTIVE:  Note: Objective measures were completed at Evaluation unless otherwise noted.  PATIENT SURVEYS:  LEFS  Extreme difficulty/unable (0), Quite a bit of difficulty (1), Moderate difficulty (2), Little difficulty (3), No difficulty (4) Survey date:    Any of your usual work, housework or school activities 0  2. Usual hobbies, recreational or sporting activities 0  3. Getting into/out of the bath 1  4. Walking between rooms 1  5. Putting on socks/shoes 1  6. Squatting  0  7. Lifting an object, like a bag of groceries from the floor 1  8. Performing light activities around your home 1  9. Performing heavy activities around your home 0  10. Getting into/out of a car 1  11. Walking 2 blocks 0  12. Walking 1 mile 0  13. Going up/down 10 stairs (1 flight) 0  14. Standing for 1 hour 0  15.  sitting for 1 hour 2  16. Running on even ground 0  17. Running on uneven ground 0  18. Making sharp turns while running fast 0  19. Hopping  0  20. Rolling over in bed 2  Score total:  10/80     COGNITIVE STATUS: Within functional  limits for tasks  assessed   SENSATION: WFL  EDEMA:  Yes: 45 cm Rt, 41 Lt  GAIT: EVAL: arrived with bilat axillary crutches with brace locked in ext, foot placing on floor   Body Part #1 Knee  PALPATION: Eval: good patellar mobility  LOWER EXTREMITY ROM:     Active  Right eval   Knee flexion 60   Knee extension 0   Ankle dorsiflexion -4    (Blank rows = not tested)                                                                                                                                TREATMENT DATE:    8/28: PROM Quads sets 5 x15 SLR x10 with minA Seated self assisted flexion stretch 10sec x5 Partial LAQ 2x10 Standing HR x20 Standing knee flexion (marching and HSC) Gait in hall without brace x158ft  Discussed desensitization techniques and importance of HEP frequency    8/21 Quad sets Passive flexion  NMES with with quad set (50%DC, 25mA, 10/10 5sec ramp) x70min KTC with red physioball SLR with assist from PTA        8/14 Quad sets 5 x10 NMES with quad set (50%DC, 25mA, 10/10 5sec ramp) x93min PROM KTC with red physioball x27min   8/7 Bandage change Education on restrictions and protocol PROM within pain tolerance Quad sets 5-second hold x 3 minutes Reviewed brace fitting Reviewed weightbearing with crutches Reviewed bed mobility with contralateral LE assist   8/2 EVAL Change bandages See HEP Gait with crutches   PATIENT EDUCATION:  Education details: Anatomy of condition, POC, HEP, exercise form/rationale  Person educated: Patient and Spouse Education method: Explanation, Demonstration, Tactile cues, Verbal cues, and Handouts Education comprehension: verbalized understanding, returned demonstration, verbal cues required, tactile cues required, and needs further education  HOME EXERCISE PROGRAM: Access Code: [email or web address] URL: [email or web address] Date: 10/22/2023 Prepared by: Harlene Cordon  Exercises - Long Sitting Quad  Set  - 1 x daily - 7 x weekly - 3 sets - 10 reps - Supine Ankle Pumps in Elevation on Pillows  - 1 x daily - 7 x weekly - 3 sets - 10 reps - Seated Hamstring Stretch with Chair  - 1 x daily - 7 x weekly - 3 sets - 10 reps - Seated Heel Slide  - 1 x daily - 7 x weekly - 3 sets - 10 reps - Standing Gluteal Sets  - 1 x daily - 7 x weekly - 3 sets - 10 reps   ASSESSMENT:  CLINICAL IMPRESSION: Patient remains tight into endrange knee flexion.  Instructed in self assisted with seated knee flexion stretch with good tolerance.  Patient improving with ability of quad contraction compared to previous sessions.  NMES not required today.  Patient able to complete SLR with very minimal to no clinician assist.  Instructed her to continue working on this at home.  Would like to see 3 x  10 solid reps before releasing to no brace.  Instructed her to continue with brace use outside of home.  Did unlock to 90 degrees to allow for improved gait.  Observed gait in clinic without brace donned with patient demonstrating excellent heel contact and toe off as well as equal step length.  Patient will continue to benefit from skilled physical therapy to improve functional strength and range of motion.    REHAB POTENTIAL: Good  CLINICAL DECISION MAKING: Stable/uncomplicated  EVALUATION COMPLEXITY: Low   GOALS: Goals reviewed with patient? Yes  SHORT TERM GOALS: Target date: 8/30  Knee ROM 0-90 comfortably Baseline: Goal status: INITIAL  2.  Sit<>stand from elevated chair without compensatory pattern.  Baseline:  Goal status: INITIAL  3.  Ambulation without AD, proper form Baseline:  Goal status: INITIAL    LONG TERM GOALS: Target date: POC date  Reciprocal stair navigation with good form and pain <=2/10 Baseline:  Goal status: INITIAL  2.  Strength 80% of contralateral leg via HHD testing Baseline:  Goal status: INITIAL  3.  Return to walking program for regular exercise Baseline:  Goal  status: INITIAL  4.  Average knee pain with daily activities <=2/10 Baseline:  Goal status: INITIAL     PLAN:  PT FREQUENCY: 1-2x/week  PT DURATION: POC date  PLANNED INTERVENTIONS: 97164- PT Re-evaluation, 97750- Physical Performance Testing, 97110-Therapeutic exercises, 97530- Therapeutic activity, [id number]- Neuromuscular re-education, 97535- Self Care, 02859- Manual therapy, [phone]- Gait training, [phone]- Aquatic Therapy, Patient/Family education, Balance training, Stair training, Taping, Joint mobilization, Spinal mobilization, Scar mobilization, and Cryotherapy.  PLAN FOR NEXT SESSION: per protocol- [place]  PROTOCOL: [email or web address]?la=en%3C/a   Asberry FORBES Rodes, PTA 11/15/2023, 5:02 PM

## 2023-11-22 ENCOUNTER — Ambulatory Visit (HOSPITAL_BASED_OUTPATIENT_CLINIC_OR_DEPARTMENT_OTHER): Admitting: Physical Therapy

## 2023-11-23 ENCOUNTER — Other Ambulatory Visit: Payer: Self-pay | Admitting: Internal Medicine

## 2023-11-23 DIAGNOSIS — Z1231 Encounter for screening mammogram for malignant neoplasm of breast: Secondary | ICD-10-CM

## 2023-11-28 ENCOUNTER — Ambulatory Visit (HOSPITAL_BASED_OUTPATIENT_CLINIC_OR_DEPARTMENT_OTHER): Attending: Orthopaedic Surgery | Admitting: Physical Therapy

## 2023-11-28 DIAGNOSIS — R262 Difficulty in walking, not elsewhere classified: Secondary | ICD-10-CM | POA: Insufficient documentation

## 2023-11-28 DIAGNOSIS — M25661 Stiffness of right knee, not elsewhere classified: Secondary | ICD-10-CM | POA: Insufficient documentation

## 2023-11-28 DIAGNOSIS — M25561 Pain in right knee: Secondary | ICD-10-CM | POA: Insufficient documentation

## 2023-11-29 ENCOUNTER — Encounter: Admitting: Obstetrics & Gynecology

## 2023-11-30 ENCOUNTER — Ambulatory Visit (HOSPITAL_BASED_OUTPATIENT_CLINIC_OR_DEPARTMENT_OTHER): Admitting: Physical Therapy

## 2023-11-30 ENCOUNTER — Encounter (HOSPITAL_BASED_OUTPATIENT_CLINIC_OR_DEPARTMENT_OTHER): Payer: Self-pay | Admitting: Physical Therapy

## 2023-11-30 DIAGNOSIS — M25561 Pain in right knee: Secondary | ICD-10-CM | POA: Diagnosis present

## 2023-11-30 DIAGNOSIS — R262 Difficulty in walking, not elsewhere classified: Secondary | ICD-10-CM | POA: Diagnosis present

## 2023-11-30 DIAGNOSIS — M25661 Stiffness of right knee, not elsewhere classified: Secondary | ICD-10-CM | POA: Diagnosis present

## 2023-11-30 NOTE — Therapy (Signed)
 OUTPATIENT PHYSICAL THERAPY TREATMENT   Patient Name: MILES BORKOWSKI MRN: [id number] DOB:[birth year], 48 y.o., female Today's Date: 11/30/2023  END OF SESSION:  PT End of Session - 11/30/23 1523     Visit Number 6    Number of Visits 25    Date for PT Re-Evaluation 01/12/24    Authorization Type BCBS    PT Start Time 1441   late pt arrival   PT Stop Time 1512    PT Time Calculation (min) 31 min    Activity Tolerance Patient tolerated treatment well    Behavior During Therapy WFL for tasks assessed/performed                Past Medical History:  Diagnosis Date   Anxiety    Bronchitis    Carpal tunnel syndrome on both sides    Constipation    Depression    Family history of adverse reaction to anesthesia    mother had problems waking up from surgery.   GERD (gastroesophageal reflux disease)    Gestational diabetes    Gestational diabetes mellitus    Normal 2 hr GTT postpartum   Headache    migraines   High cholesterol    Hypertension    Infertility, female    Lactose intolerance    Oligohydramnios antepartum 11/09/2017   Palpitations    Panic attack 2017   diagnosed 3 months ago. no meds presently   Reflux    did not filll prescription   S/P percutaneous patent foramen ovale closure 06/16/2020   s/p PFO occluder device with a a 25 MM AMPLATZER by Dr. Wonda   Stroke [facility])    Swelling    Vitamin D  deficiency    Past Surgical History:  Procedure Laterality Date   BREAST EXCISIONAL BIOPSY Right    cyst   BUBBLE STUDY  04/16/2020   Procedure: BUBBLE STUDY;  Surgeon: Raford Riggs, MD;  Location: [facility] ENDOSCOPY;  Service: Cardiovascular;;   DILATION AND EVACUATION N/A 07/26/2015   Procedure: DILATATION AND EVACUATION;  Surgeon: Carlin DELENA Centers, MD;  Location: [facility] ORS;  Service: Gynecology;  Laterality: N/A;   PATENT FORAMEN OVALE(PFO) CLOSURE N/A 06/16/2020   Procedure: PATENT FORAMEN OVALE (PFO) CLOSURE;  Surgeon: Wonda Sharper, MD;  Location: [facility]  INVASIVE CV LAB;  Service: Cardiovascular;  Laterality: N/A;   TEE WITHOUT CARDIOVERSION N/A 04/16/2020   Procedure: TRANSESOPHAGEAL ECHOCARDIOGRAM (TEE);  Surgeon: Raford Riggs, MD;  Location: [facility] ENDOSCOPY;  Service: Cardiovascular;  Laterality: N/A;   Patient Active Problem List   Diagnosis Date Noted   Acute pain of right knee 03/05/2023   Need for hepatitis C screening test 03/05/2023   Screening for colon cancer 03/05/2023   Need for immunization against influenza 03/05/2023   Minor neurocognitive disorder 08/03/2022   Mild vascular neurocognitive disorder 08/03/2022   Dyslipidemia, goal LDL below 70 07/04/2022   Tobacco abuse 07/04/2022   Mild episode of recurrent major depressive disorder (HCC) 07/04/2022   Encounter for general adult medical examination with abnormal findings 07/04/2022   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   Other hyperlipidemia 08/16/2021   Class 1 obesity with serious comorbidity and body mass index (BMI) of 33.0 to 33.9 in adult 08/16/2021   Abnormal screening mammogram 08/16/2021   Psychophysiological insomnia 07/08/2020   Chronic migraine without aura without status migrainosus, not intractable 07/08/2020   S/P percutaneous patent foramen ovale closure 06/16/2020   PFO (patent foramen ovale) - ROPE score 5 05/31/2020   Hypertension    Vitamin D   deficiency 06/07/2017    REFERRING PROVIDER:  Genelle Standing, MD     REFERRING DIAG:  [phone] (ICD-10-CM) - Patellar instability of right knee    PROCEDURE: 1.  Right knee medial patellofemoral ligament reconstruction 2.  Right knee arthroscopy with medial chondroplasty  Rationale for Evaluation and Treatment: Rehabilitation  THERAPY DIAG:  Stiffness of right knee, not elsewhere classified  Acute pain of right knee  Difficulty in walking, not elsewhere classified  ONSET DATE: DOS 10/18/23   SUBJECTIVE:                                                                                                                                                                                            SUBJECTIVE STATEMENT:  I was at the park with my daughter the other day and she wanted to run, I tried to run but I wasn't able to so I just tried to walk quickly. Knee wanted to buckle. Not wearing the brace a lot at home, I think the knee swells more when I don't wear the brace. ScarAway ointment is helping a lot.   PERTINENT HISTORY:  Anxiety, stroke  PAIN:  Are you having pain? Yes: NPRS scale: 2/10 Pain location: Rt knee Pain description: sore Aggravating factors: walking Relieving factors: ice, meds  PRECAUTIONS:  None  RED FLAGS: None   WEIGHT BEARING RESTRICTIONS:  WBAT  FALLS:  Has patient fallen in last 6 months? No  LIVING ENVIRONMENT: Stairs to basement- spiral  OCCUPATION:  claims  PLOF:  Independent  PATIENT GOALS:  Walk better, decrease pain, walk for exercise   OBJECTIVE:  Note: Objective measures were completed at Evaluation unless otherwise noted.  PATIENT SURVEYS:  LEFS  Extreme difficulty/unable (0), Quite a bit of difficulty (1), Moderate difficulty (2), Little difficulty (3), No difficulty (4) Survey date:    Any of your usual work, housework or school activities 0  2. Usual hobbies, recreational or sporting activities 0  3. Getting into/out of the bath 1  4. Walking between rooms 1  5. Putting on socks/shoes 1  6. Squatting  0  7. Lifting an object, like a bag of groceries from the floor 1  8. Performing light activities around your home 1  9. Performing heavy activities around your home 0  10. Getting into/out of a car 1  11. Walking 2 blocks 0  12. Walking 1 mile 0  13. Going up/down 10 stairs (1 flight) 0  14. Standing for 1 hour 0  15.  sitting for 1 hour 2  16. Running on even ground 0  17. Running on uneven ground 0  18.  Making sharp turns while running fast 0  19. Hopping  0  20. Rolling over in bed 2  Score total:  10/80      COGNITIVE STATUS: Within functional limits for tasks assessed   SENSATION: WFL  EDEMA:  Yes: 45 cm Rt, 41 Lt  GAIT: EVAL: arrived with bilat axillary crutches with brace locked in ext, foot placing on floor   Body Part #1 Knee  PALPATION: Eval: good patellar mobility  LOWER EXTREMITY ROM:     Active  Right eval 11/30/23  Knee flexion 60 117 AAROM  Knee extension 0   Ankle dorsiflexion -4    (Blank rows = not tested)                                                                                                                                TREATMENT DATE:    11/30/23   PROM ROM, goals  SLR x10 min guard SAQs 2# x10 SLR + ER 2x5 min quad very tremulous Bridges + ABD into red TB x15 Sidelying hip ABD red TB x10 B   Education on her current phase in protocol as well as ongoing significant quad weakness. Advised her to continue to wear brace to help prevent knee buckling and avoid advanced activities like running at this point.       8/28: PROM Quads sets 5 x15 SLR x10 with minA Seated self assisted flexion stretch 10sec x5 Partial LAQ 2x10 Standing HR x20 Standing knee flexion (marching and HSC) Gait in hall without brace x165ft  Discussed desensitization techniques and importance of HEP frequency    8/21 Quad sets Passive flexion  NMES with with quad set (50%DC, 25mA, 10/10 5sec ramp) x75min KTC with red physioball SLR with assist from PTA        8/14 Quad sets 5 x10 NMES with quad set (50%DC, 25mA, 10/10 5sec ramp) x65min PROM KTC with red physioball x86min   8/7 Bandage change Education on restrictions and protocol PROM within pain tolerance Quad sets 5-second hold x 3 minutes Reviewed brace fitting Reviewed weightbearing with crutches Reviewed bed mobility with contralateral LE assist   8/2 EVAL Change bandages See HEP Gait with crutches   PATIENT EDUCATION:  Education details: Anatomy of condition, POC, HEP,  exercise form/rationale  Person educated: Patient and Spouse Education method: Explanation, Demonstration, Tactile cues, Verbal cues, and Handouts Education comprehension: verbalized understanding, returned demonstration, verbal cues required, tactile cues required, and needs further education  HOME EXERCISE PROGRAM:  Access Code: [email or web address] URL: [email or web address] Date: 11/30/2023 Prepared by: Josette Rough  Exercises - Long Sitting Quad Set  - 1 x daily - 7 x weekly - 3 sets - 10 reps - Supine Ankle Pumps in Elevation on Pillows  - 1 x daily - 7 x weekly - 3 sets - 10 reps - Seated Hamstring Stretch with Chair  - 1 x daily - 7 x weekly - 3 sets -  10 reps - Seated Heel Slide  - 1 x daily - 7 x weekly - 3 sets - 10 reps - Standing Gluteal Sets  - 1 x daily - 7 x weekly - 3 sets - 10 reps - Active Straight Leg Raise with Quad Set  - 2 x daily - 7 x weekly - 1 sets - 10 reps - Straight Leg Raise with External Rotation  - 2 x daily - 7 x weekly - 1 sets - 5 reps - Supine Bridge with Resistance Band  - 1 x daily - 7 x weekly - 1-2 sets - 10 reps - Sidelying Hip Abduction with Resistance at Thighs  - 1 x daily - 7 x weekly - 1-2 sets - 10 reps    ASSESSMENT:  CLINICAL IMPRESSION:   Arrived a little late today but in good spirits- gave a lot of education on her current phase in protocol as well as ongoing significant quad weakness. Advised her to continue to wear brace to help prevent knee buckling and avoid advanced activities like running at this point. Progressed all exercises- now able to perform SLRs without assist from PT so added these to HEP as well as some hip strengthening exercises with band. Goals updated. Will continue to progress as appropriate per protocol and as tolerated.      REHAB POTENTIAL: Good  CLINICAL DECISION MAKING: Stable/uncomplicated  EVALUATION COMPLEXITY: Low   GOALS: Goals reviewed with patient? Yes  SHORT TERM GOALS: Target date:  8/30  Knee ROM 0-90 comfortably Baseline: Goal status: MET 11/30/23  2.  Sit<>stand from elevated chair without compensatory pattern.  Baseline:  Goal status: INITIAL  3.  Ambulation without AD, proper form Baseline:  Goal status: ONGOING 11/30/23     LONG TERM GOALS: Target date: POC date 01/12/24  Reciprocal stair navigation with good form and pain <=2/10 Baseline:  Goal status: INITIAL  2.  Strength 80% of contralateral leg via HHD testing Baseline:  Goal status: INITIAL  3.  Return to walking program for regular exercise Baseline:  Goal status: INITIAL  4.  Average knee pain with daily activities <=2/10 Baseline:  Goal status: INITIAL     PLAN:  PT FREQUENCY: 1-2x/week  PT DURATION: POC date  PLANNED INTERVENTIONS: 97164- PT Re-evaluation, 97750- Physical Performance Testing, 97110-Therapeutic exercises, 97530- Therapeutic activity, [id number]- Neuromuscular re-education, 97535- Self Care, 02859- Manual therapy, [phone]- Gait training, [phone]- Aquatic Therapy, Patient/Family education, Balance training, Stair training, Taping, Joint mobilization, Spinal mobilization, Scar mobilization, and Cryotherapy.  PLAN FOR NEXT SESSION: per protocol- [place], how do HEP updates feel?   PROTOCOL: [email or web address]?la=[email or web address]/a   Josette Rough, PT, DPT 11/30/23 3:24 PM

## 2023-12-03 ENCOUNTER — Ambulatory Visit

## 2023-12-03 DIAGNOSIS — Z1231 Encounter for screening mammogram for malignant neoplasm of breast: Secondary | ICD-10-CM

## 2023-12-05 ENCOUNTER — Other Ambulatory Visit: Payer: Self-pay | Admitting: Internal Medicine

## 2023-12-05 ENCOUNTER — Ambulatory Visit (HOSPITAL_BASED_OUTPATIENT_CLINIC_OR_DEPARTMENT_OTHER): Admitting: Physical Therapy

## 2023-12-05 ENCOUNTER — Encounter (HOSPITAL_BASED_OUTPATIENT_CLINIC_OR_DEPARTMENT_OTHER): Payer: Self-pay | Admitting: Physical Therapy

## 2023-12-05 DIAGNOSIS — R262 Difficulty in walking, not elsewhere classified: Secondary | ICD-10-CM

## 2023-12-05 DIAGNOSIS — M25661 Stiffness of right knee, not elsewhere classified: Secondary | ICD-10-CM

## 2023-12-05 DIAGNOSIS — Z1231 Encounter for screening mammogram for malignant neoplasm of breast: Secondary | ICD-10-CM

## 2023-12-05 DIAGNOSIS — M25561 Pain in right knee: Secondary | ICD-10-CM | POA: Diagnosis not present

## 2023-12-05 NOTE — Therapy (Signed)
 OUTPATIENT PHYSICAL THERAPY TREATMENT   Patient Name: Tracey [name] MRN: [id number] DOB:1975/04/20, 48 y.o., female Today's Date: 12/05/2023  END OF SESSION:  PT End of Session - 12/05/23 1441     Visit Number 7    Number of Visits 25    Date for PT Re-Evaluation 01/12/24    Authorization Type BC[employer]    PT Start Time 1441   arrives late   PT Stop Time 1515    PT Time Calculation (min) 34 min    Activity Tolerance Patient tolerated treatment well    Behavior During Therapy WFL for tasks assessed/performed                Past Medical History:  Diagnosis Date   Anxiety    Bronchitis    Carpal tunnel syndrome on both sides    Constipation    Depression    Family history of adverse reaction to anesthesia    mother had problems waking up from surgery.   GERD (gastroesophageal reflux disease)    Gestational diabetes    Gestational diabetes mellitus    Normal 2 hr GTT postpartum   Headache    migraines   High cholesterol    Hypertension    Infertility, female    Lactose intolerance    Oligohydramnios antepartum 11/09/2017   Palpitations    Panic attack 2017   diagnosed 3 months ago. no meds presently   Reflux    did not filll prescription   S/P percutaneous patent foramen ovale closure 06/16/2020   s/p PFO occluder device with a a 25 MM AMPLATZER by Dr. Wonda   Stroke [facility])    Swelling    Vitamin D  deficiency    Past Surgical History:  Procedure Laterality Date   BREAST EXCISIONAL BIOPSY Right    cyst   BUBBLE STUDY  04/16/2020   Procedure: BUBBLE STUDY;  Surgeon: Raford Riggs, MD;  Location: [facility] ENDOSCOPY;  Service: Cardiovascular;;   DILATION AND EVACUATION N/A 07/26/2015   Procedure: DILATATION AND EVACUATION;  Surgeon: Carlin DELENA Centers, MD;  Location: [facility] ORS;  Service: Gynecology;  Laterality: N/A;   PATENT FORAMEN OVALE(PFO) CLOSURE N/A 06/16/2020   Procedure: PATENT FORAMEN OVALE (PFO) CLOSURE;  Surgeon: Wonda Sharper, MD;  Location: [facility]  INVASIVE CV LAB;  Service: Cardiovascular;  Laterality: N/A;   TEE WITHOUT CARDIOVERSION N/A 04/16/2020   Procedure: TRANSESOPHAGEAL ECHOCARDIOGRAM (TEE);  Surgeon: Raford Riggs, MD;  Location: [facility] ENDOSCOPY;  Service: Cardiovascular;  Laterality: N/A;   Patient Active Problem List   Diagnosis Date Noted   Acute pain of right knee 03/05/2023   Need for hepatitis C screening test 03/05/2023   Screening for colon cancer 03/05/2023   Need for immunization against influenza 03/05/2023   Minor neurocognitive disorder 08/03/2022   Mild vascular neurocognitive disorder 08/03/2022   Dyslipidemia, goal LDL below 70 07/04/2022   Tobacco abuse 07/04/2022   Mild episode of recurrent major depressive disorder (HCC) 07/04/2022   Encounter for general adult medical examination with abnormal findings 07/04/2022   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   Other hyperlipidemia 08/16/2021   Class 1 obesity with serious comorbidity and body mass index (BMI) of 33.0 to 33.9 in adult 08/16/2021   Abnormal screening mammogram 08/16/2021   Psychophysiological insomnia 07/08/2020   Chronic migraine without aura without status migrainosus, not intractable 07/08/2020   S/P percutaneous patent foramen ovale closure 06/16/2020   PFO (patent foramen ovale) - ROPE score 5 05/31/2020   Hypertension    Vitamin D  deficiency  06/07/2017    REFERRING PROVIDER:  Genelle Standing, MD     REFERRING DIAG:  [phone] (ICD-10-CM) - Patellar instability of right knee    PROCEDURE: 1.  Right knee medial patellofemoral ligament reconstruction 2.  Right knee arthroscopy with medial chondroplasty  Rationale for Evaluation and Treatment: Rehabilitation  THERAPY DIAG:  Stiffness of right knee, not elsewhere classified  Acute pain of right knee  Difficulty in walking, not elsewhere classified  ONSET DATE: DOS 10/18/23   SUBJECTIVE:                                                                                                                                                                                            SUBJECTIVE STATEMENT:  Still having some pain and swelling.   PERTINENT HISTORY:  Anxiety, stroke  PAIN:  Are you having pain? Yes: NPRS scale: 2/10 Pain location: Rt knee Pain description: sore Aggravating factors: walking Relieving factors: ice, meds  PRECAUTIONS:  None  RED FLAGS: None   WEIGHT BEARING RESTRICTIONS:  WBAT  FALLS:  Has patient fallen in last 6 months? No  LIVING ENVIRONMENT: Stairs to basement- spiral  OCCUPATION:  claims  PLOF:  Independent  PATIENT GOALS:  Walk better, decrease pain, walk for exercise   OBJECTIVE:  Note: Objective measures were completed at Evaluation unless otherwise noted.  PATIENT SURVEYS:  LEFS  Extreme difficulty/unable (0), Quite a bit of difficulty (1), Moderate difficulty (2), Little difficulty (3), No difficulty (4) Survey date:    Any of your usual work, housework or school activities 0  2. Usual hobbies, recreational or sporting activities 0  3. Getting into/out of the bath 1  4. Walking between rooms 1  5. Putting on socks/shoes 1  6. Squatting  0  7. Lifting an object, like a bag of groceries from the floor 1  8. Performing light activities around your home 1  9. Performing heavy activities around your home 0  10. Getting into/out of a car 1  11. Walking 2 blocks 0  12. Walking 1 mile 0  13. Going up/down 10 stairs (1 flight) 0  14. Standing for 1 hour 0  15.  sitting for 1 hour 2  16. Running on even ground 0  17. Running on uneven ground 0  18. Making sharp turns while running fast 0  19. Hopping  0  20. Rolling over in bed 2  Score total:  10/80     COGNITIVE STATUS: Within functional limits for tasks assessed   SENSATION: WFL  EDEMA:  Yes: 45 cm Rt, 41 Lt  GAIT: EVAL: arrived with bilat axillary crutches  with brace locked in ext, foot placing on floor   Body Part #1 Knee  PALPATION: Eval:  good patellar mobility  LOWER EXTREMITY ROM:     Active  Right eval 11/30/23 12/05/23  Knee flexion 60 117 AAROM 111  Knee extension 0    Ankle dorsiflexion -4     (Blank rows = not tested)                                                                                                                                TREATMENT DATE:  12/05/23 Manual: STM/TPR R quad SAQ 3# 2 x 10 SLR 2 x 10 LAQ 1 x 10 SLS 3 x 30 second holds  11/30/23   PROM ROM, goals  SLR x10 min guard SAQs 2# x10 SLR + ER 2x5 min quad very tremulous Bridges + ABD into red TB x15 Sidelying hip ABD red TB x10 B   Education on her current phase in protocol as well as ongoing significant quad weakness. Advised her to continue to wear brace to help prevent knee buckling and avoid advanced activities like running at this point.       8/28: PROM Quads sets 5 x15 SLR x10 with minA Seated self assisted flexion stretch 10sec x5 Partial LAQ 2x10 Standing HR x20 Standing knee flexion (marching and HSC) Gait in hall without brace x128ft  Discussed desensitization techniques and importance of HEP frequency    8/21 Quad sets Passive flexion  NMES with with quad set (50%DC, 25mA, 10/10 5sec ramp) x59min KTC with red physioball SLR with assist from PTA        8/14 Quad sets 5 x10 NMES with quad set (50%DC, 25mA, 10/10 5sec ramp) x69min PROM KTC with red physioball x64min   8/7 Bandage change Education on restrictions and protocol PROM within pain tolerance Quad sets 5-second hold x 3 minutes Reviewed brace fitting Reviewed weightbearing with crutches Reviewed bed mobility with contralateral LE assist   8/2 EVAL Change bandages See HEP Gait with crutches   PATIENT EDUCATION:  Education details: Anatomy of condition, POC, HEP, exercise form/rationale  Person educated: Patient and Spouse Education method: Explanation, Demonstration, Tactile cues, Verbal cues, and  Handouts Education comprehension: verbalized understanding, returned demonstration, verbal cues required, tactile cues required, and needs further education  HOME EXERCISE PROGRAM:  Access Code: [email or web address] URL: [email or web address] Date: 11/30/2023 Prepared by: Josette Rough  Exercises - Long Sitting Quad Set  - 1 x daily - 7 x weekly - 3 sets - 10 reps - Supine Ankle Pumps in Elevation on Pillows  - 1 x daily - 7 x weekly - 3 sets - 10 reps - Seated Hamstring Stretch with Chair  - 1 x daily - 7 x weekly - 3 sets - 10 reps - Seated Heel Slide  - 1 x daily - 7 x weekly - 3 sets - 10 reps - Standing Gluteal Sets  - 1 x daily -  7 x weekly - 3 sets - 10 reps - Active Straight Leg Raise with Quad Set  - 2 x daily - 7 x weekly - 1 sets - 10 reps - Straight Leg Raise with External Rotation  - 2 x daily - 7 x weekly - 1 sets - 5 reps - Supine Bridge with Resistance Band  - 1 x daily - 7 x weekly - 1-2 sets - 10 reps - Sidelying Hip Abduction with Resistance at Thighs  - 1 x daily - 7 x weekly - 1-2 sets - 10 reps    ASSESSMENT:  CLINICAL IMPRESSION:   Session limited to late arrival. Manual for hyperactive and tender knee with decrease in symptoms following. Continued with quad strengthening with intermittent cueing for mechanics. Patient will continue to benefit from physical therapy in order to improve function and reduce impairment.      REHAB POTENTIAL: Good  CLINICAL DECISION MAKING: Stable/uncomplicated  EVALUATION COMPLEXITY: Low   GOALS: Goals reviewed with patient? Yes  SHORT TERM GOALS: Target date: 8/30  Knee ROM 0-90 comfortably Baseline: Goal status: MET 11/30/23  2.  Sit<>stand from elevated chair without compensatory pattern.  Baseline:  Goal status: INITIAL  3.  Ambulation without AD, proper form Baseline:  Goal status: ONGOING 11/30/23     LONG TERM GOALS: Target date: POC date 01/12/24  Reciprocal stair navigation with good form and  pain <=2/10 Baseline:  Goal status: INITIAL  2.  Strength 80% of contralateral leg via HHD testing Baseline:  Goal status: INITIAL  3.  Return to walking program for regular exercise Baseline:  Goal status: INITIAL  4.  Average knee pain with daily activities <=2/10 Baseline:  Goal status: INITIAL     PLAN:  PT FREQUENCY: 1-2x/week  PT DURATION: POC date  PLANNED INTERVENTIONS: 97164- PT Re-evaluation, 97750- Physical Performance Testing, 97110-Therapeutic exercises, 97530- Therapeutic activity, [id number]- Neuromuscular re-education, 97535- Self Care, [id number]- Manual therapy, [phone]- Gait training, [phone]- Aquatic Therapy, Patient/Family education, Balance training, Stair training, Taping, Joint mobilization, Spinal mobilization, Scar mobilization, and Cryotherapy.  PLAN FOR NEXT SESSION: per protocol- [place]  State, how do HEP updates feel?   PROTOCOL: [email or web address]?[email or web address]    Prentice RAMAN Cruz Devilla, PT 12/05/2023, 3:18 PM

## 2023-12-06 ENCOUNTER — Encounter (HOSPITAL_BASED_OUTPATIENT_CLINIC_OR_DEPARTMENT_OTHER): Payer: Self-pay

## 2023-12-06 DIAGNOSIS — E7849 Other hyperlipidemia: Secondary | ICD-10-CM

## 2023-12-07 ENCOUNTER — Encounter (HOSPITAL_BASED_OUTPATIENT_CLINIC_OR_DEPARTMENT_OTHER): Payer: Self-pay

## 2023-12-07 ENCOUNTER — Ambulatory Visit (HOSPITAL_BASED_OUTPATIENT_CLINIC_OR_DEPARTMENT_OTHER)

## 2023-12-08 ENCOUNTER — Telehealth: Admitting: Nurse Practitioner

## 2023-12-08 DIAGNOSIS — R112 Nausea with vomiting, unspecified: Secondary | ICD-10-CM

## 2023-12-08 DIAGNOSIS — M791 Myalgia, unspecified site: Secondary | ICD-10-CM

## 2023-12-08 DIAGNOSIS — R051 Acute cough: Secondary | ICD-10-CM | POA: Diagnosis not present

## 2023-12-08 DIAGNOSIS — R6889 Other general symptoms and signs: Secondary | ICD-10-CM

## 2023-12-08 DIAGNOSIS — R197 Diarrhea, unspecified: Secondary | ICD-10-CM

## 2023-12-08 MED ORDER — ONDANSETRON HCL 4 MG PO TABS: 4.0000 mg | ORAL_TABLET | Freq: Three times a day (TID) | ORAL | 0 refills | Status: DC | PRN

## 2023-12-08 MED ORDER — PSEUDOEPH-BROMPHEN-DM 30-2-10 MG/5ML PO SYRP: 5.0000 mL | ORAL_SOLUTION | Freq: Four times a day (QID) | ORAL | 0 refills | Status: AC | PRN

## 2023-12-08 NOTE — Progress Notes (Signed)
 I have spent 5 minutes in review of e-visit questionnaire, review and updating patient chart, medical decision making and response to patient.   Claiborne Rigg, NP

## 2023-12-08 NOTE — Progress Notes (Signed)
 E visit for Flu symptoms   We are sorry that you are not feeling well.  Here is how we plan to help! Based on what you have shared with me it looks like you may have flu-like symptoms that should be watched but do not seem to indicate anti-viral treatment.  Influenza or "the flu" is   an infection caused by a respiratory virus. The flu virus is highly contagious and persons who did not receive their yearly flu vaccination may "catch" the flu from close contact.  We have anti-viral medications to treat the viruses that cause this infection. They are not a "cure" and only shorten the course of the infection. These prescriptions are most effective when they are given within the first 2 days of "flu" symptoms. Antiviral medication is indicated if you have a high risk of complications from the flu. You should also consider an antiviral medication if you are in close contact with someone who is at risk. These medications can help patients avoid complications from the flu but have side effects that you should know. Possible side effects from Tamiflu or oseltamivir include nausea, vomiting, diarrhea, dizziness, headaches, eye redness, sleep problems or other respiratory symptoms.  You should not take Tamiflu if you have an allergy to oseltamivir or any to the ingredients in Tamiflu.  Based upon your symptoms and potential risk factors I have prescribed Zofran  4 mg tablets 1 every 6 hours if needed for nausea    You are to isolate at home until you have been fever-free for at least 24 hours without a fever-reducing medication, and symptoms have been steadily improving for 24 hours. At that time, you can end isolation but need to mask for an additional 5 days.  If you must be around other household members who do not have symptoms, you need to make sure that both you and the family members are masking consistently with a high-quality mask.  If you note any worsening of symptoms despite treatment, please seek  an in-person evaluation ASAP. If you note any significant shortness of breath or any chest pain, please seek ED evaluation. Please do not delay care!  Go to the nearest hospital ED for assessment if fever/cough/breathlessness are severe or illness seems like a threat to life.    The following symptoms may appear 2-14 days after exposure: Fever Cough Shortness of breath or difficulty breathing Chills Repeated shaking with chills Muscle pain Headache Sore throat New loss of taste or smell Fatigue Congestion or runny nose Nausea or vomiting Diarrhea  You can use medication such as Imodium AD for your diarrhea.   For nasal congestion, you may use an oral decongestant such as Mucinex D or if you have glaucoma or high blood pressure use plain Mucinex.  Saline nasal spray or nasal drops can help and can safely be used as often as needed for congestion.  For your cough, I have prescribed a prescription cough syrup  If you have a sore or scratchy throat, use a saltwater gargle-  to  teaspoon of salt dissolved in a 4-ounce to 8-ounce glass of warm water.  Gargle the solution for approximately 15-30 seconds and then spit.  It is important not to swallow the solution.  You can also use throat lozenges/cough drops and Chloraseptic spray to help with throat pain or discomfort.  Warm or cold liquids can also be helpful in relieving throat pain.  For headache, pain or general discomfort, you can use Ibuprofen  or Tylenol  as directed.  Some authorities believe that zinc sprays or the use of Echinacea may shorten the course of your symptoms.  ANYONE WHO HAS FLU SYMPTOMS SHOULD:  Stay home. The flu is highly contagious and going out or to work exposes others!  Be sure to drink plenty of fluids. Water is fine as well as fruit juices, sodas and electrolyte beverages. You may want to stay       away from caffeine or alcohol. If you are nauseated, try taking small sips of liquids. How do you know if you  are getting enough fluid? Your urine should be a pale yellow or almost colorless.  Get rest.  Taking a steamy shower or using a humidifier may help nasal congestion and ease sore throat pain. Using a saline nasal spray works much the same way.  Cough drops, hard candies and sore throat lozenges may ease your cough.  Line up a caregiver. Have someone check on you regularly.  GET HELP RIGHT AWAY IF YOU HAVE EMERGENCY WARNING SIGNS - CALL 911 or proceed to your closest emergency facility if:  You develop worsening high fever. Trouble breathing Bluish lips or face Persistent pain or pressure in the chest New confusion Inability to wake or stay awake You cough up blood. Your symptoms become more severe Inability to hold down food or fluids  MAKE SURE YOU Understand these instructions. Will watch your condition. Will get help right away if you are not doing well or get worse.  Your e-visit answers were reviewed by a board certified advanced clinical practitioner to complete your personal care plan.  Depending on the condition, your plan could have included both over the counter or prescription medications. If there is a problem, please reply once you have received a response from your provider. Your safety is important to us .  If you have drug allergies, check your prescription carefully.   You can use MyChart to ask questions about today's visit, request a non-urgent call back, or ask for a work or school excuse for 24 hours related to this e-Visit. If it has been greater than 24 hours you will need to follow up with your provider or enter a new e-Visit to address those concerns. You will get an e-mail in the next two days asking about your experience.  I hope that your e-visit has been valuable and will speed up your recovery. Thank you for using E-visits!

## 2023-12-10 ENCOUNTER — Ambulatory Visit

## 2023-12-11 ENCOUNTER — Ambulatory Visit (HOSPITAL_BASED_OUTPATIENT_CLINIC_OR_DEPARTMENT_OTHER)

## 2023-12-11 ENCOUNTER — Telehealth (HOSPITAL_BASED_OUTPATIENT_CLINIC_OR_DEPARTMENT_OTHER): Payer: Self-pay

## 2023-12-11 NOTE — Telephone Encounter (Signed)
 Called pt and left VM regarding missed appt. Left callback number and reminded of next appt.

## 2023-12-14 ENCOUNTER — Ambulatory Visit (HOSPITAL_BASED_OUTPATIENT_CLINIC_OR_DEPARTMENT_OTHER): Admitting: Physical Therapy

## 2023-12-14 ENCOUNTER — Encounter (HOSPITAL_BASED_OUTPATIENT_CLINIC_OR_DEPARTMENT_OTHER): Payer: Self-pay | Admitting: Physical Therapy

## 2023-12-14 DIAGNOSIS — M25561 Pain in right knee: Secondary | ICD-10-CM | POA: Diagnosis not present

## 2023-12-14 DIAGNOSIS — R262 Difficulty in walking, not elsewhere classified: Secondary | ICD-10-CM

## 2023-12-14 DIAGNOSIS — M25661 Stiffness of right knee, not elsewhere classified: Secondary | ICD-10-CM

## 2023-12-14 NOTE — Therapy (Addendum)
 OUTPATIENT PHYSICAL THERAPY TREATMENT   Patient Name: Tracey [name] MRN: [id number] DOB:1976-03-19, 48 y.o., female Today's Date: 12/14/2023  END OF SESSION:  PT End of Session - 12/14/23 1159     Visit Number 8    Number of Visits 25    Date for Recertification  01/12/24    Authorization Type BCBS    PT Start Time 1159   Late arrival   PT Stop Time 1230    PT Time Calculation (min) 31 min    Activity Tolerance Patient tolerated treatment well    Behavior During Therapy WFL for tasks assessed/performed                Past Medical History:  Diagnosis Date   Anxiety    Bronchitis    Carpal tunnel syndrome on both sides    Constipation    Depression    Family history of adverse reaction to anesthesia    mother had problems waking up from surgery.   GERD (gastroesophageal reflux disease)    Gestational diabetes    Gestational diabetes mellitus    Normal 2 hr GTT postpartum   Headache    migraines   High cholesterol    Hypertension    Infertility, female    Lactose intolerance    Oligohydramnios antepartum 11/09/2017   Palpitations    Panic attack 2017   diagnosed 3 months ago. no meds presently   Reflux    did not filll prescription   S/P percutaneous patent foramen ovale closure 06/16/2020   s/p PFO occluder device with a a 25 MM AMPLATZER by Dr. Wonda   Stroke [facility])    Swelling    Vitamin D  deficiency    Past Surgical History:  Procedure Laterality Date   BREAST EXCISIONAL BIOPSY Right    cyst   BUBBLE STUDY  04/16/2020   Procedure: BUBBLE STUDY;  Surgeon: Raford Riggs, MD;  Location: [facility] ENDOSCOPY;  Service: Cardiovascular;;   DILATION AND EVACUATION N/A 07/26/2015   Procedure: DILATATION AND EVACUATION;  Surgeon: Carlin DELENA Centers, MD;  Location: [facility] ORS;  Service: Gynecology;  Laterality: N/A;   PATENT FORAMEN OVALE(PFO) CLOSURE N/A 06/16/2020   Procedure: PATENT FORAMEN OVALE (PFO) CLOSURE;  Surgeon: Wonda Sharper, MD;  Location: [facility]  INVASIVE CV LAB;  Service: Cardiovascular;  Laterality: N/A;   TEE WITHOUT CARDIOVERSION N/A 04/16/2020   Procedure: TRANSESOPHAGEAL ECHOCARDIOGRAM (TEE);  Surgeon: Raford Riggs, MD;  Location: [facility] ENDOSCOPY;  Service: Cardiovascular;  Laterality: N/A;   Patient Active Problem List   Diagnosis Date Noted   Acute pain of right knee 03/05/2023   Need for hepatitis C screening test 03/05/2023   Screening for colon cancer 03/05/2023   Need for immunization against influenza 03/05/2023   Minor neurocognitive disorder 08/03/2022   Mild vascular neurocognitive disorder 08/03/2022   Dyslipidemia, goal LDL below 70 07/04/2022   Tobacco abuse 07/04/2022   Mild episode of recurrent major depressive disorder 07/04/2022   Encounter for general adult medical examination with abnormal findings 07/04/2022   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   Other hyperlipidemia 08/16/2021   Class 1 obesity with serious comorbidity and body mass index (BMI) of 33.0 to 33.9 in adult 08/16/2021   Abnormal screening mammogram 08/16/2021   Psychophysiological insomnia 07/08/2020   Chronic migraine without aura without status migrainosus, not intractable 07/08/2020   S/P percutaneous patent foramen ovale closure 06/16/2020   PFO (patent foramen ovale) - ROPE score 5 05/31/2020   Hypertension    Vitamin D  deficiency 06/07/2017  REFERRING PROVIDER:  Genelle Standing, MD     REFERRING DIAG:  [phone] (ICD-10-CM) - Patellar instability of right knee    PROCEDURE: 1.  Right knee medial patellofemoral ligament reconstruction 2.  Right knee arthroscopy with medial chondroplasty  Rationale for Evaluation and Treatment: Rehabilitation  THERAPY DIAG:  Acute pain of right knee  Stiffness of right knee, not elsewhere classified  Difficulty in walking, not elsewhere classified  ONSET DATE: DOS 10/18/23   SUBJECTIVE:                                                                                                                                                                                            SUBJECTIVE STATEMENT: Still having some pain and swelling. Has been sick and hasn't been up and moving as much.  PERTINENT HISTORY:  Anxiety, stroke  PAIN:  Are you having pain? Yes: NPRS scale: 2/10 Pain location: Rt knee Pain description: sore Aggravating factors: walking Relieving factors: ice, meds  PRECAUTIONS:  None  RED FLAGS: None   WEIGHT BEARING RESTRICTIONS:  WBAT  FALLS:  Has patient fallen in last 6 months? No  LIVING ENVIRONMENT: Stairs to basement- spiral  OCCUPATION:  claims  PLOF:  Independent  PATIENT GOALS:  Walk better, decrease pain, walk for exercise   OBJECTIVE:  Note: Objective measures were completed at Evaluation unless otherwise noted.  PATIENT SURVEYS:  LEFS  Extreme difficulty/unable (0), Quite a bit of difficulty (1), Moderate difficulty (2), Little difficulty (3), No difficulty (4) Survey date:    Any of your usual work, housework or school activities 0  2. Usual hobbies, recreational or sporting activities 0  3. Getting into/out of the bath 1  4. Walking between rooms 1  5. Putting on socks/shoes 1  6. Squatting  0  7. Lifting an object, like a bag of groceries from the floor 1  8. Performing light activities around your home 1  9. Performing heavy activities around your home 0  10. Getting into/out of a car 1  11. Walking 2 blocks 0  12. Walking 1 mile 0  13. Going up/down 10 stairs (1 flight) 0  14. Standing for 1 hour 0  15.  sitting for 1 hour 2  16. Running on even ground 0  17. Running on uneven ground 0  18. Making sharp turns while running fast 0  19. Hopping  0  20. Rolling over in bed 2  Score total:  10/80     COGNITIVE STATUS: Within functional limits for tasks assessed   SENSATION: WFL  EDEMA:  Yes: 45 cm Rt, 41 Lt  GAIT: EVAL:  arrived with bilat axillary crutches with brace locked in ext, foot placing on  floor   Body Part #1 Knee  PALPATION: Eval: good patellar mobility  LOWER EXTREMITY ROM:     Active  Right eval 11/30/23 12/05/23  Knee flexion 60 117 AAROM 111  Knee extension 0    Ankle dorsiflexion -4     (Blank rows = not tested)                                                                                                                                TREATMENT DATE:  12/14/23 LAQ 3# 3x10 SciFit bike lvl 5 x6 min Mini squats 3x10  Step ups to 6 inch box 3x10   12/05/23 Manual: STM/TPR R quad SAQ 3# 2 x 10 SLR 2 x 10 LAQ 1 x 10 SLS 3 x 30 second holds  11/30/23   PROM ROM, goals  SLR x10 min guard SAQs 2# x10 SLR + ER 2x5 min quad very tremulous Bridges + ABD into red TB x15 Sidelying hip ABD red TB x10 B   Education on her current phase in protocol as well as ongoing significant quad weakness. Advised her to continue to wear brace to help prevent knee buckling and avoid advanced activities like running at this point.       8/28: PROM Quads sets 5 x15 SLR x10 with minA Seated self assisted flexion stretch 10sec x5 Partial LAQ 2x10 Standing HR x20 Standing knee flexion (marching and HSC) Gait in hall without brace x140ft  Discussed desensitization techniques and importance of HEP frequency    8/21 Quad sets Passive flexion  NMES with with quad set (50%DC, 25mA, 10/10 5sec ramp) x14min KTC with red physioball SLR with assist from PTA        8/14 Quad sets 5 x10 NMES with quad set (50%DC, 25mA, 10/10 5sec ramp) x61min PROM KTC with red physioball x23min   8/7 Bandage change Education on restrictions and protocol PROM within pain tolerance Quad sets 5-second hold x 3 minutes Reviewed brace fitting Reviewed weightbearing with crutches Reviewed bed mobility with contralateral LE assist   8/2 EVAL Change bandages See HEP Gait with crutches   PATIENT EDUCATION:  Education details: Anatomy of condition, POC, HEP, exercise  form/rationale  Person educated: Patient and Spouse Education method: Explanation, Demonstration, Tactile cues, Verbal cues, and Handouts Education comprehension: verbalized understanding, returned demonstration, verbal cues required, tactile cues required, and needs further education  HOME EXERCISE PROGRAM:  Access Code: [email or web address] URL: [email or web address] Date: 11/30/2023 Prepared by: Josette Rough  Exercises - Long Sitting Quad Set  - 1 x daily - 7 x weekly - 3 sets - 10 reps - Supine Ankle Pumps in Elevation on Pillows  - 1 x daily - 7 x weekly - 3 sets - 10 reps - Seated Hamstring Stretch with Chair  - 1 x daily - 7 x weekly - 3 sets - 10 reps -  Seated Heel Slide  - 1 x daily - 7 x weekly - 3 sets - 10 reps - Standing Gluteal Sets  - 1 x daily - 7 x weekly - 3 sets - 10 reps - Active Straight Leg Raise with Quad Set  - 2 x daily - 7 x weekly - 1 sets - 10 reps - Straight Leg Raise with External Rotation  - 2 x daily - 7 x weekly - 1 sets - 5 reps - Supine Bridge with Resistance Band  - 1 x daily - 7 x weekly - 1-2 sets - 10 reps - Sidelying Hip Abduction with Resistance at Thighs  - 1 x daily - 7 x weekly - 1-2 sets - 10 reps    ASSESSMENT:  CLINICAL IMPRESSION: Session limited to late arrival. Interventions focused on loading the quad for strengthening an improved ROM. Pt tolerated exercises well and is continuing to improve. Verbal and tactile cueing to prevent knee hyperextension during step ups. Responded well to cueing and had good control of quad at the end of session. Continue to progress as tolerated.    REHAB POTENTIAL: Good  CLINICAL DECISION MAKING: Stable/uncomplicated  EVALUATION COMPLEXITY: Low   GOALS: Goals reviewed with patient? Yes  SHORT TERM GOALS: Target date: 8/30  Knee ROM 0-90 comfortably Baseline: Goal status: MET 11/30/23  2.  Sit<>stand from elevated chair without compensatory pattern.  Baseline:  Goal status:  INITIAL  3.  Ambulation without AD, proper form Baseline:  Goal status: ONGOING 11/30/23     LONG TERM GOALS: Target date: POC date 01/12/24  Reciprocal stair navigation with good form and pain <=2/10 Baseline:  Goal status: INITIAL  2.  Strength 80% of contralateral leg via HHD testing Baseline:  Goal status: INITIAL  3.  Return to walking program for regular exercise Baseline:  Goal status: INITIAL  4.  Average knee pain with daily activities <=2/10 Baseline:  Goal status: INITIAL     PLAN:  PT FREQUENCY: 1-2x/week  PT DURATION: POC date  PLANNED INTERVENTIONS: 97164- PT Re-evaluation, 97750- Physical Performance Testing, 97110-Therapeutic exercises, 97530- Therapeutic activity, [id number]- Neuromuscular re-education, 97535- Self Care, 02859- Manual therapy, [phone]- Gait training, [phone]- Aquatic Therapy, Patient/Family education, Balance training, Stair training, Taping, Joint mobilization, Spinal mobilization, Scar mobilization, and Cryotherapy.  PLAN FOR NEXT SESSION: per protocol- [place], how do HEP updates feel?   PROTOCOL: [email or web address]?la=[email or web address]/a  Lili Finder, Student-PT 12/14/2023, 12:02 PM  This entire session was performed under direct supervision and direction of a licensed therapist/therapist assistant . I have personally read, edited and approve of the note as written. 12:34 PM, 12/14/23 Prentice CANDIE Stains PT, DPT Physical Therapist at [facility]

## 2023-12-16 LAB — COMPREHENSIVE METABOLIC PANEL WITH GFR
ALT: 18 IU/L (ref 0–32)
AST: 16 IU/L (ref 0–40)
Albumin: 4.1 g/dL (ref 3.9–4.9)
Alkaline Phosphatase: 56 IU/L (ref 41–116)
BUN/Creatinine Ratio: 12 (ref 9–23)
BUN: 11 mg/dL (ref 6–24)
Bilirubin Total: 0.3 mg/dL (ref 0.0–1.2)
CO2: 20 mmol/L (ref 20–29)
Calcium: 8.9 mg/dL (ref 8.7–10.2)
Chloride: 103 mmol/L (ref 96–106)
Creatinine, Ser: 0.93 mg/dL (ref 0.57–1.00)
Globulin, Total: 2.6 g/dL (ref 1.5–4.5)
Glucose: 87 mg/dL (ref 70–99)
Potassium: 3.8 mmol/L (ref 3.5–5.2)
Sodium: 139 mmol/L (ref 134–144)
Total Protein: 6.7 g/dL (ref 6.0–8.5)
eGFR: 77 mL/min/1.73 (ref >59)

## 2023-12-16 LAB — LIPID PANEL
Chol/HDL Ratio: 3.1 ratio (ref 0.0–4.4)
Cholesterol, Total: 107 mg/dL (ref 100–199)
HDL: 34 mg/dL — ABNORMAL LOW (ref >39)
LDL Chol Calc (NIH): 60 mg/dL (ref 0–99)
Triglycerides: 59 mg/dL (ref 0–149)
VLDL Cholesterol Cal: 13 mg/dL (ref 5–40)

## 2023-12-16 LAB — LIPOPROTEIN A (LPA): Lipoprotein (a): <8.4 nmol/L (ref <75.0)

## 2023-12-17 ENCOUNTER — Ambulatory Visit (HOSPITAL_BASED_OUTPATIENT_CLINIC_OR_DEPARTMENT_OTHER): Payer: Self-pay | Admitting: Family

## 2023-12-17 ENCOUNTER — Other Ambulatory Visit: Payer: Self-pay

## 2023-12-17 ENCOUNTER — Encounter

## 2023-12-17 DIAGNOSIS — E7849 Other hyperlipidemia: Secondary | ICD-10-CM

## 2023-12-17 DIAGNOSIS — Z1231 Encounter for screening mammogram for malignant neoplasm of breast: Secondary | ICD-10-CM

## 2023-12-17 MED ORDER — ROSUVASTATIN CALCIUM 10 MG PO TABS: 10.0000 mg | ORAL_TABLET | Freq: Every day | ORAL | 2 refills | Status: DC

## 2023-12-17 MED ORDER — LISINOPRIL 20 MG PO TABS: 30.0000 mg | ORAL_TABLET | Freq: Every day | ORAL | 2 refills | Status: DC

## 2023-12-17 NOTE — Telephone Encounter (Signed)
 RX sent in

## 2023-12-18 ENCOUNTER — Other Ambulatory Visit: Payer: Self-pay | Admitting: Internal Medicine

## 2023-12-18 DIAGNOSIS — Z1231 Encounter for screening mammogram for malignant neoplasm of breast: Secondary | ICD-10-CM

## 2023-12-19 ENCOUNTER — Ambulatory Visit (HOSPITAL_BASED_OUTPATIENT_CLINIC_OR_DEPARTMENT_OTHER): Admitting: Orthopaedic Surgery

## 2023-12-19 ENCOUNTER — Encounter (HOSPITAL_BASED_OUTPATIENT_CLINIC_OR_DEPARTMENT_OTHER): Payer: Self-pay | Admitting: Physical Therapy

## 2023-12-19 ENCOUNTER — Ambulatory Visit (HOSPITAL_BASED_OUTPATIENT_CLINIC_OR_DEPARTMENT_OTHER): Attending: Orthopaedic Surgery | Admitting: Physical Therapy

## 2023-12-19 DIAGNOSIS — R262 Difficulty in walking, not elsewhere classified: Secondary | ICD-10-CM | POA: Insufficient documentation

## 2023-12-19 DIAGNOSIS — M25561 Pain in right knee: Secondary | ICD-10-CM | POA: Diagnosis present

## 2023-12-19 DIAGNOSIS — M25661 Stiffness of right knee, not elsewhere classified: Secondary | ICD-10-CM | POA: Diagnosis present

## 2023-12-19 NOTE — Therapy (Addendum)
 OUTPATIENT PHYSICAL THERAPY TREATMENT   Patient Name: Tracey [name] MRN: [id number] DOB:05/19/1975, 48 y.o., female Today's Date: 12/19/2023  END OF SESSION:  PT End of Session - 12/19/23 1443     Visit Number 9    Number of Visits 25    Date for Recertification  01/12/24    Authorization Type BCBS    PT Start Time 1443   late arrival   PT Stop Time 1516    PT Time Calculation (min) 33 min    Activity Tolerance Patient tolerated treatment well    Behavior During Therapy WFL for tasks assessed/performed         Past Medical History:  Diagnosis Date   Anxiety    Bronchitis    Carpal tunnel syndrome on both sides    Constipation    Depression    Family history of adverse reaction to anesthesia    mother had problems waking up from surgery.   GERD (gastroesophageal reflux disease)    Gestational diabetes    Gestational diabetes mellitus    Normal 2 hr GTT postpartum   Headache    migraines   High cholesterol    Hypertension    Infertility, female    Lactose intolerance    Oligohydramnios antepartum 11/09/2017   Palpitations    Panic attack 2017   diagnosed 3 months ago. no meds presently   Reflux    did not filll prescription   S/P percutaneous patent foramen ovale closure 06/16/2020   s/p PFO occluder device with a a 25 MM AMPLATZER by Dr. Wonda   Stroke [facility])    Swelling    Vitamin D  deficiency    Past Surgical History:  Procedure Laterality Date   BREAST EXCISIONAL BIOPSY Right    cyst   BUBBLE STUDY  04/16/2020   Procedure: BUBBLE STUDY;  Surgeon: Raford Riggs, MD;  Location: [facility] ENDOSCOPY;  Service: Cardiovascular;;   DILATION AND EVACUATION N/A 07/26/2015   Procedure: DILATATION AND EVACUATION;  Surgeon: Carlin DELENA Centers, MD;  Location: [facility] ORS;  Service: Gynecology;  Laterality: N/A;   PATENT FORAMEN OVALE(PFO) CLOSURE N/A 06/16/2020   Procedure: PATENT FORAMEN OVALE (PFO) CLOSURE;  Surgeon: Wonda Sharper, MD;  Location: [facility] INVASIVE CV LAB;   Service: Cardiovascular;  Laterality: N/A;   TEE WITHOUT CARDIOVERSION N/A 04/16/2020   Procedure: TRANSESOPHAGEAL ECHOCARDIOGRAM (TEE);  Surgeon: Raford Riggs, MD;  Location: [facility] ENDOSCOPY;  Service: Cardiovascular;  Laterality: N/A;   Patient Active Problem List   Diagnosis Date Noted   Acute pain of right knee 03/05/2023   Need for hepatitis C screening test 03/05/2023   Screening for colon cancer 03/05/2023   Need for immunization against influenza 03/05/2023   Minor neurocognitive disorder 08/03/2022   Mild vascular neurocognitive disorder 08/03/2022   Dyslipidemia, goal LDL below 70 07/04/2022   Tobacco abuse 07/04/2022   Mild episode of recurrent major depressive disorder 07/04/2022   Encounter for general adult medical examination with abnormal findings 07/04/2022   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   Other hyperlipidemia 08/16/2021   Class 1 obesity with serious comorbidity and body mass index (BMI) of 33.0 to 33.9 in adult 08/16/2021   Abnormal screening mammogram 08/16/2021   Psychophysiological insomnia 07/08/2020   Chronic migraine without aura without status migrainosus, not intractable 07/08/2020   S/P percutaneous patent foramen ovale closure 06/16/2020   PFO (patent foramen ovale) - ROPE score 5 05/31/2020   Hypertension    Vitamin D  deficiency 06/07/2017   REFERRING PROVIDER:  Genelle Standing,  MD     REFERRING DIAG:  M25.361 (ICD-10-CM) - Patellar instability of right knee    PROCEDURE: 1.  Right knee medial patellofemoral ligament reconstruction 2.  Right knee arthroscopy with medial chondroplasty  Rationale for Evaluation and Treatment: Rehabilitation  THERAPY DIAG:  Acute pain of right knee  Stiffness of right knee, not elsewhere classified  Difficulty in walking, not elsewhere classified  ONSET DATE: DOS 10/18/23   SUBJECTIVE:                                                                                                                                                                                            SUBJECTIVE STATEMENT: Still having some pain and swelling. Has been sick and hasn't been up and moving as much.  PERTINENT HISTORY:  Anxiety, stroke  PAIN:  Are you having pain? Yes: NPRS scale: 2/10 Pain location: Rt knee Pain description: sore Aggravating factors: walking Relieving factors: ice, meds  PRECAUTIONS:  None  RED FLAGS: None   WEIGHT BEARING RESTRICTIONS:  WBAT  FALLS:  Has patient fallen in last 6 months? No  LIVING ENVIRONMENT: Stairs to basement- spiral  OCCUPATION:  claims  PLOF:  Independent  PATIENT GOALS:  Walk better, decrease pain, walk for exercise   OBJECTIVE:  Note: Objective measures were completed at Evaluation unless otherwise noted.  PATIENT SURVEYS:  LEFS  Extreme difficulty/unable (0), Quite a bit of difficulty (1), Moderate difficulty (2), Little difficulty (3), No difficulty (4) Survey date:  EVAL 12/19/23  Any of your usual work, housework or school activities 0 4  2. Usual hobbies, recreational or sporting activities 0 2  3. Getting into/out of the bath 1 4  4. Walking between rooms 1 3  5. Putting on socks/shoes 1 3  6. Squatting  0 4  7. Lifting an object, like a bag of groceries from the floor 1 4  8. Performing light activities around your home 1 4  9. Performing heavy activities around your home 0 3  10. Getting into/out of a car 1 3  11. Walking 2 blocks 0 3  12. Walking 1 mile 0 3  13. Going up/down 10 stairs (1 flight) 0 3  14. Standing for 1 hour 0 3  15.  sitting for 1 hour 2 3  16. Running on even ground 0 1  17. Running on uneven ground 0 1  18. Making sharp turns while running fast 0 1  19. Hopping  0 1  20. Rolling over in bed 2 4  Score total:  10/80 57/80     COGNITIVE STATUS: Within functional limits for tasks  assessed   SENSATION: WFL  EDEMA:  Yes: 45 cm Rt, 41 Lt  GAIT: EVAL: arrived with bilat axillary crutches with brace  locked in ext, foot placing on floor   Body Part #1 Knee  PALPATION: Eval: good patellar mobility  LOWER EXTREMITY ROM:     Active  Right eval 11/30/23 12/05/23  Knee flexion 60 117 AAROM 111  Knee extension 0    Ankle dorsiflexion -4     (Blank rows = not tested)                                                                                                                     TREATMENT DATE:  12/19/23 Reassessment of goals  NuStep x5 min lvl 5 Mini squats 3x10 Step ups x10 Lunge x10  12/14/23 LAQ 3# 3x10 SciFit bike lvl 5 x6 min Mini squats 3x10  Step ups to 6 inch box 3x10   12/05/23 Manual: STM/TPR R quad SAQ 3# 2 x 10 SLR 2 x 10 LAQ 1 x 10 SLS 3 x 30 second holds  11/30/23 PROM ROM, goals  SLR x10 min guard SAQs 2# x10 SLR + ER 2x5 min quad very tremulous Bridges + ABD into red TB x15 Sidelying hip ABD red TB x10 B Education on her current phase in protocol as well as ongoing significant quad weakness. Advised her to continue to wear brace to help prevent knee buckling and avoid advanced activities like running at this point.   8/28: PROM Quads sets 5 x15 SLR x10 with minA Seated self assisted flexion stretch 10sec x5 Partial LAQ 2x10 Standing HR x20 Standing knee flexion (marching and HSC) Gait in hall without brace x165ft  Discussed desensitization techniques and importance of HEP frequency  8/21 Quad sets Passive flexion  NMES with with quad set (50%DC, 25mA, 10/10 5sec ramp) x59min KTC with red physioball SLR with assist from PTA   8/14 Quad sets 5 x10 NMES with quad set (50%DC, 25mA, 10/10 5sec ramp) x58min PROM KTC with red physioball x61min  8/7 Bandage change Education on restrictions and protocol PROM within pain tolerance Quad sets 5-second hold x 3 minutes Reviewed brace fitting Reviewed weightbearing with crutches Reviewed bed mobility with contralateral LE assist  8/2 EVAL Change bandages See HEP Gait with  crutches   PATIENT EDUCATION:  Education details: Anatomy of condition, POC, HEP, exercise form/rationale  Person educated: Patient and Spouse Education method: Explanation, Demonstration, Tactile cues, Verbal cues, and Handouts Education comprehension: verbalized understanding, returned demonstration, verbal cues required, tactile cues required, and needs further education  HOME EXERCISE PROGRAM:  Access Code: [email or web address] URL: [email or web address] Date: 11/30/2023 Prepared by: Josette Rough  Exercises - Long Sitting Quad Set  - 1 x daily - 7 x weekly - 3 sets - 10 reps - Supine Ankle Pumps in Elevation on Pillows  - 1 x daily - 7 x weekly - 3 sets - 10 reps - Seated Hamstring Stretch with Chair  - 1 x daily - 7  x weekly - 3 sets - 10 reps - Seated Heel Slide  - 1 x daily - 7 x weekly - 3 sets - 10 reps - Standing Gluteal Sets  - 1 x daily - 7 x weekly - 3 sets - 10 reps - Active Straight Leg Raise with Quad Set  - 2 x daily - 7 x weekly - 1 sets - 10 reps - Straight Leg Raise with External Rotation  - 2 x daily - 7 x weekly - 1 sets - 5 reps - Supine Bridge with Resistance Band  - 1 x daily - 7 x weekly - 1-2 sets - 10 reps - Sidelying Hip Abduction with Resistance at Thighs  - 1 x daily - 7 x weekly - 1-2 sets - 10 reps    ASSESSMENT:  CLINICAL IMPRESSION: Session limited to late arrival. Reassessment of goals. Has met 3/3 short-term goals and 1/4 long-term goals. Remaining goals unmet due to lingering strength, ROM, and endurance deficits. Pt tolerated new exercise well today and continues to improve quad control each week. Educated on progressing walking program at home to increase activity tolerance. Will continue to benefit from therapy to address remaining goals.   REHAB POTENTIAL: Good  CLINICAL DECISION MAKING: Stable/uncomplicated  EVALUATION COMPLEXITY: Low   GOALS: Goals reviewed with patient? Yes  SHORT TERM GOALS: Target date: 8/30  Knee ROM  0-90 comfortably Baseline: Goal status: MET 11/30/23  2.  Sit<>stand from elevated chair without compensatory pattern.  Baseline:  Goal status: MET 12/19/23  3.  Ambulation without AD, proper form Baseline:  Goal status: MET 12/19/23    LONG TERM GOALS: Target date: POC date 01/12/24  Reciprocal stair navigation with good form and pain <=2/10 Baseline:  Goal status: ONGOING 12/19/23  2.  Strength 80% of contralateral leg via HHD testing Baseline:  Goal status: ONGOING 12/19/23  3.  Return to walking program for regular exercise Baseline:  Goal status: MET 12/19/23  4.  Average knee pain with daily activities <=2/10 Baseline:  Goal status: ONGOING (4/10) 12/19/23    PLAN:  PT FREQUENCY: 1-2x/week  PT DURATION: POC date  PLANNED INTERVENTIONS: 97164- PT Re-evaluation, 97750- Physical Performance Testing, 97110-Therapeutic exercises, 97530- Therapeutic activity, [id number]- Neuromuscular re-education, 97535- Self Care, 02859- Manual therapy, [phone]- Gait training, [phone]- Aquatic Therapy, Patient/Family education, Balance training, Stair training, Taping, Joint mobilization, Spinal mobilization, Scar mobilization, and Cryotherapy.  PLAN FOR NEXT SESSION: per protocol- [place], how do HEP updates feel?   PROTOCOL: [email or web address]?la=[email or web address]/a  Lili Finder, Student-PT 12/19/2023, 3:30 PM  This entire session was performed under direct supervision and direction of a licensed therapist/therapist assistant . I have personally read, edited and approve of the note as written. 3:58 PM, 12/19/23 Prentice CANDIE Stains PT, DPT Physical Therapist at [facility]

## 2023-12-21 ENCOUNTER — Ambulatory Visit (HOSPITAL_BASED_OUTPATIENT_CLINIC_OR_DEPARTMENT_OTHER)

## 2023-12-21 ENCOUNTER — Encounter (HOSPITAL_BASED_OUTPATIENT_CLINIC_OR_DEPARTMENT_OTHER): Payer: Self-pay

## 2023-12-21 DIAGNOSIS — R262 Difficulty in walking, not elsewhere classified: Secondary | ICD-10-CM

## 2023-12-21 DIAGNOSIS — M25561 Pain in right knee: Secondary | ICD-10-CM | POA: Diagnosis not present

## 2023-12-21 DIAGNOSIS — M25661 Stiffness of right knee, not elsewhere classified: Secondary | ICD-10-CM

## 2023-12-21 NOTE — Therapy (Signed)
 OUTPATIENT PHYSICAL THERAPY TREATMENT   Patient Name: Tracey [name] MRN: [id number] DOB:11-Jul-1975, 48 y.o., female Today's Date: 12/21/2023  END OF SESSION:  PT End of Session - 12/21/23 1447     Visit Number 10    Number of Visits 25    Date for Recertification  01/12/24    Authorization Type BCBS    PT Start Time 1443   Pt arrived late   PT Stop Time 1515    PT Time Calculation (min) 32 min    Activity Tolerance Patient tolerated treatment well    Behavior During Therapy WFL for tasks assessed/performed          Past Medical History:  Diagnosis Date   Anxiety    Bronchitis    Carpal tunnel syndrome on both sides    Constipation    Depression    Family history of adverse reaction to anesthesia    mother had problems waking up from surgery.   GERD (gastroesophageal reflux disease)    Gestational diabetes    Gestational diabetes mellitus    Normal 2 hr GTT postpartum   Headache    migraines   High cholesterol    Hypertension    Infertility, female    Lactose intolerance    Oligohydramnios antepartum 11/09/2017   Palpitations    Panic attack 2017   diagnosed 3 months ago. no meds presently   Reflux    did not filll prescription   S/P percutaneous patent foramen ovale closure 06/16/2020   s/p PFO occluder device with a a 25 MM AMPLATZER by Dr. Wonda   Stroke [facility])    Swelling    Vitamin D  deficiency    Past Surgical History:  Procedure Laterality Date   BREAST EXCISIONAL BIOPSY Right    cyst   BUBBLE STUDY  04/16/2020   Procedure: BUBBLE STUDY;  Surgeon: Raford Riggs, MD;  Location: [facility] ENDOSCOPY;  Service: Cardiovascular;;   DILATION AND EVACUATION N/A 07/26/2015   Procedure: DILATATION AND EVACUATION;  Surgeon: Carlin DELENA Centers, MD;  Location: [facility] ORS;  Service: Gynecology;  Laterality: N/A;   PATENT FORAMEN OVALE(PFO) CLOSURE N/A 06/16/2020   Procedure: PATENT FORAMEN OVALE (PFO) CLOSURE;  Surgeon: Wonda Sharper, MD;  Location: [facility] INVASIVE CV  LAB;  Service: Cardiovascular;  Laterality: N/A;   TEE WITHOUT CARDIOVERSION N/A 04/16/2020   Procedure: TRANSESOPHAGEAL ECHOCARDIOGRAM (TEE);  Surgeon: Raford Riggs, MD;  Location: [facility] ENDOSCOPY;  Service: Cardiovascular;  Laterality: N/A;   Patient Active Problem List   Diagnosis Date Noted   Acute pain of right knee 03/05/2023   Need for hepatitis C screening test 03/05/2023   Screening for colon cancer 03/05/2023   Need for immunization against influenza 03/05/2023   Minor neurocognitive disorder 08/03/2022   Mild vascular neurocognitive disorder 08/03/2022   Dyslipidemia, goal LDL below 70 07/04/2022   Tobacco abuse 07/04/2022   Mild episode of recurrent major depressive disorder 07/04/2022   Encounter for general adult medical examination with abnormal findings 07/04/2022   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   Other hyperlipidemia 08/16/2021   Class 1 obesity with serious comorbidity and body mass index (BMI) of 33.0 to 33.9 in adult 08/16/2021   Abnormal screening mammogram 08/16/2021   Psychophysiological insomnia 07/08/2020   Chronic migraine without aura without status migrainosus, not intractable 07/08/2020   S/P percutaneous patent foramen ovale closure 06/16/2020   PFO (patent foramen ovale) - ROPE score 5 05/31/2020   Hypertension    Vitamin D  deficiency 06/07/2017   REFERRING PROVIDER:  Genelle Standing, MD     REFERRING DIAG:  [phone] (ICD-10-CM) - Patellar instability of right knee    PROCEDURE: 1.  Right knee medial patellofemoral ligament reconstruction 2.  Right knee arthroscopy with medial chondroplasty  Rationale for Evaluation and Treatment: Rehabilitation  THERAPY DIAG:  Acute pain of right knee  Stiffness of right knee, not elsewhere classified  Difficulty in walking, not elsewhere classified  ONSET DATE: DOS 10/18/23   SUBJECTIVE:                                                                                                                                                                                            SUBJECTIVE STATEMENT: Pt reports 2/10 pain level at entry. Cites general stiffness.   PERTINENT HISTORY:  Anxiety, stroke  PAIN:  Are you having pain? Yes: NPRS scale: 2/10 Pain location: Rt knee Pain description: sore Aggravating factors: walking Relieving factors: ice, meds  PRECAUTIONS:  None  RED FLAGS: None   WEIGHT BEARING RESTRICTIONS:  WBAT  FALLS:  Has patient fallen in last 6 months? No  LIVING ENVIRONMENT: Stairs to basement- spiral  OCCUPATION:  claims  PLOF:  Independent  PATIENT GOALS:  Walk better, decrease pain, walk for exercise   OBJECTIVE:  Note: Objective measures were completed at Evaluation unless otherwise noted.  PATIENT SURVEYS:  LEFS  Extreme difficulty/unable (0), Quite a bit of difficulty (1), Moderate difficulty (2), Little difficulty (3), No difficulty (4) Survey date:  EVAL 12/19/23  Any of your usual work, housework or school activities 0 4  2. Usual hobbies, recreational or sporting activities 0 2  3. Getting into/out of the bath 1 4  4. Walking between rooms 1 3  5. Putting on socks/shoes 1 3  6. Squatting  0 4  7. Lifting an object, like a bag of groceries from the floor 1 4  8. Performing light activities around your home 1 4  9. Performing heavy activities around your home 0 3  10. Getting into/out of a car 1 3  11. Walking 2 blocks 0 3  12. Walking 1 mile 0 3  13. Going up/down 10 stairs (1 flight) 0 3  14. Standing for 1 hour 0 3  15.  sitting for 1 hour 2 3  16. Running on even ground 0 1  17. Running on uneven ground 0 1  18. Making sharp turns while running fast 0 1  19. Hopping  0 1  20. Rolling over in bed 2 4  Score total:  10/80 57/80     COGNITIVE STATUS: Within functional limits for tasks assessed   SENSATION:  WFL  EDEMA:  Yes: 45 cm Rt, 41 Lt  GAIT: EVAL: arrived with bilat axillary crutches with brace locked in ext, foot  placing on floor   Body Part #1 Knee  PALPATION: Eval: good patellar mobility  LOWER EXTREMITY ROM:     Active  Right eval 11/30/23 12/05/23  Knee flexion 60 117 AAROM 111  Knee extension 0    Ankle dorsiflexion -4     (Blank rows = not tested)                                                                                                                     TREATMENT DATE:   12/21/23 Scifit bike L3 x13min SLR 2x10 STM to medial distal HS PROM R knee Step ups bottom stair 2x10 STS staggered 2x10 R posterior SLDL x10   12/19/23 Reassessment of goals  NuStep x5 min lvl 5 Mini squats 3x10 Step ups x10 Lunge x10  12/14/23 LAQ 3# 3x10 SciFit bike lvl 5 x6 min Mini squats 3x10  Step ups to 6 inch box 3x10   12/05/23 Manual: STM/TPR R quad SAQ 3# 2 x 10 SLR 2 x 10 LAQ 1 x 10 SLS 3 x 30 second holds  11/30/23 PROM ROM, goals  SLR x10 min guard SAQs 2# x10 SLR + ER 2x5 min quad very tremulous Bridges + ABD into red TB x15 Sidelying hip ABD red TB x10 B Education on her current phase in protocol as well as ongoing significant quad weakness. Advised her to continue to wear brace to help prevent knee buckling and avoid advanced activities like running at this point.   8/28: PROM Quads sets 5 x15 SLR x10 with minA Seated self assisted flexion stretch 10sec x5 Partial LAQ 2x10 Standing HR x20 Standing knee flexion (marching and HSC) Gait in hall without brace x170ft  Discussed desensitization techniques and importance of HEP frequency  8/21 Quad sets Passive flexion  NMES with with quad set (50%DC, 25mA, 10/10 5sec ramp) x88min KTC with red physioball SLR with assist from PTA   8/14 Quad sets 5 x10 NMES with quad set (50%DC, 25mA, 10/10 5sec ramp) x36min PROM KTC with red physioball x64min  8/7 Bandage change Education on restrictions and protocol PROM within pain tolerance Quad sets 5-second hold x 3 minutes Reviewed brace fitting Reviewed  weightbearing with crutches Reviewed bed mobility with contralateral LE assist  8/2 EVAL Change bandages See HEP Gait with crutches   PATIENT EDUCATION:  Education details: Anatomy of condition, POC, HEP, exercise form/rationale  Person educated: Patient and Spouse Education method: Explanation, Demonstration, Tactile cues, Verbal cues, and Handouts Education comprehension: verbalized understanding, returned demonstration, verbal cues required, tactile cues required, and needs further education  HOME EXERCISE PROGRAM:  Access Code: [email or web address] URL: [email or web address] Date: 11/30/2023 Prepared by: Josette Rough  Exercises - Long Sitting Quad Set  - 1 x daily - 7 x weekly - 3 sets - 10 reps - Supine Ankle Pumps in Elevation on Pillows  -  1 x daily - 7 x weekly - 3 sets - 10 reps - Seated Hamstring Stretch with Chair  - 1 x daily - 7 x weekly - 3 sets - 10 reps - Seated Heel Slide  - 1 x daily - 7 x weekly - 3 sets - 10 reps - Standing Gluteal Sets  - 1 x daily - 7 x weekly - 3 sets - 10 reps - Active Straight Leg Raise with Quad Set  - 2 x daily - 7 x weekly - 1 sets - 10 reps - Straight Leg Raise with External Rotation  - 2 x daily - 7 x weekly - 1 sets - 5 reps - Supine Bridge with Resistance Band  - 1 x daily - 7 x weekly - 1-2 sets - 10 reps - Sidelying Hip Abduction with Resistance at Thighs  - 1 x daily - 7 x weekly - 1-2 sets - 10 reps    ASSESSMENT:  CLINICAL IMPRESSION: Session limited to late arrival. Continued to focus on quad strength and endurance as well as knee ROM. Pt with improved ability for step ups today at bottom stair. Pt did not require use of Ue's for most repetitions. Initiated SLDL which was challenging due to instability. Pt will continue to benefit from PT to address ongoing strength, stability, and ROM deficits.   REHAB POTENTIAL: Good  CLINICAL DECISION MAKING: Stable/uncomplicated  EVALUATION COMPLEXITY: Low   GOALS: Goals  reviewed with patient? Yes  SHORT TERM GOALS: Target date: 8/30  Knee ROM 0-90 comfortably Baseline: Goal status: MET 11/30/23  2.  Sit<>stand from elevated chair without compensatory pattern.  Baseline:  Goal status: MET 12/19/23  3.  Ambulation without AD, proper form Baseline:  Goal status: MET 12/19/23    LONG TERM GOALS: Target date: POC date 01/12/24  Reciprocal stair navigation with good form and pain <=2/10 Baseline:  Goal status: ONGOING 12/19/23  2.  Strength 80% of contralateral leg via HHD testing Baseline:  Goal status: ONGOING 12/19/23  3.  Return to walking program for regular exercise Baseline:  Goal status: MET 12/19/23  4.  Average knee pain with daily activities <=2/10 Baseline:  Goal status: ONGOING (4/10) 12/19/23    PLAN:  PT FREQUENCY: 1-2x/week  PT DURATION: POC date  PLANNED INTERVENTIONS: 97164- PT Re-evaluation, 97750- Physical Performance Testing, 97110-Therapeutic exercises, 97530- Therapeutic activity, [id number]- Neuromuscular re-education, 97535- Self Care, 02859- Manual therapy, [phone]- Gait training, [phone]- Aquatic Therapy, Patient/Family education, Balance training, Stair training, Taping, Joint mobilization, Spinal mobilization, Scar mobilization, and Cryotherapy.  PLAN FOR NEXT SESSION: per protocol- [place]  State, how do HEP updates feel?   PROTOCOL: [email or web address]?la=[email or web address]/a  Asberry FORBES Rodes, PTA 12/21/2023, 3:41 PM

## 2023-12-24 ENCOUNTER — Telehealth: Payer: Self-pay | Admitting: Family

## 2023-12-24 DIAGNOSIS — E7849 Other hyperlipidemia: Secondary | ICD-10-CM

## 2023-12-24 MED ORDER — ROSUVASTATIN CALCIUM 10 MG PO TABS: 10.0000 mg | ORAL_TABLET | Freq: Every day | ORAL | 2 refills | Status: DC

## 2023-12-24 MED ORDER — LISINOPRIL 20 MG PO TABS: 30.0000 mg | ORAL_TABLET | Freq: Every day | ORAL | 2 refills | Status: DC

## 2023-12-24 NOTE — Telephone Encounter (Signed)
*  STAT* If patient is at the pharmacy, call can be transferred to refill team.   1. Which medications need to be refilled? (please list name of each medication and dose if known) lisinopril  (ZESTRIL ) 20 MG tablet  rosuvastatin  (CRESTOR ) 10 MG tablet    2. Would you like to learn more about the convenience, safety, & potential cost savings by using the [facility]?    3. Are you open to using the [facility] (Type [facility].  ).   4. Which pharmacy/location (including street and city if local pharmacy) is medication to be sent to? [facility]  [place], [place] - [address]    5. Do they need a 30 day or 90 day supply? 90 day

## 2023-12-24 NOTE — Telephone Encounter (Signed)
 RX sent in

## 2023-12-25 ENCOUNTER — Other Ambulatory Visit: Payer: Self-pay | Admitting: Internal Medicine

## 2023-12-25 DIAGNOSIS — Z1231 Encounter for screening mammogram for malignant neoplasm of breast: Secondary | ICD-10-CM

## 2023-12-26 ENCOUNTER — Ambulatory Visit (HOSPITAL_BASED_OUTPATIENT_CLINIC_OR_DEPARTMENT_OTHER): Admitting: Physical Therapy

## 2023-12-26 ENCOUNTER — Encounter

## 2023-12-26 ENCOUNTER — Other Ambulatory Visit: Payer: Self-pay

## 2023-12-26 DIAGNOSIS — E7849 Other hyperlipidemia: Secondary | ICD-10-CM

## 2023-12-26 DIAGNOSIS — Z1231 Encounter for screening mammogram for malignant neoplasm of breast: Secondary | ICD-10-CM

## 2023-12-26 MED ORDER — ROSUVASTATIN CALCIUM 10 MG PO TABS: 10.0000 mg | ORAL_TABLET | Freq: Every day | ORAL | 2 refills | Status: AC

## 2023-12-26 MED ORDER — LISINOPRIL 20 MG PO TABS: 30.0000 mg | ORAL_TABLET | Freq: Every day | ORAL | 2 refills | Status: AC

## 2023-12-26 NOTE — Addendum Note (Signed)
 Addended by: BLUFORD RAMP D on: 12/26/2023 12:34 PM   Modules accepted: Orders

## 2023-12-28 ENCOUNTER — Ambulatory Visit (HOSPITAL_BASED_OUTPATIENT_CLINIC_OR_DEPARTMENT_OTHER): Admitting: Physical Therapy

## 2024-01-02 ENCOUNTER — Ambulatory Visit (HOSPITAL_BASED_OUTPATIENT_CLINIC_OR_DEPARTMENT_OTHER): Admitting: Physical Therapy

## 2024-01-02 ENCOUNTER — Encounter (HOSPITAL_BASED_OUTPATIENT_CLINIC_OR_DEPARTMENT_OTHER): Payer: Self-pay | Admitting: Physical Therapy

## 2024-01-02 DIAGNOSIS — M25561 Pain in right knee: Secondary | ICD-10-CM

## 2024-01-02 DIAGNOSIS — R262 Difficulty in walking, not elsewhere classified: Secondary | ICD-10-CM

## 2024-01-02 DIAGNOSIS — M25661 Stiffness of right knee, not elsewhere classified: Secondary | ICD-10-CM

## 2024-01-02 NOTE — Therapy (Addendum)
 OUTPATIENT PHYSICAL THERAPY TREATMENT   Patient Name: Tracey [name] MRN: [id number] DOB:20-Mar-1976, 48 y.o., female Today's Date: 01/02/2024  END OF SESSION:  PT End of Session - 01/02/24 1449     Visit Number 11    Number of Visits 25    Date for Recertification  01/12/24    Authorization Type BCBS    PT Start Time 1449   Pt arrived late   PT Stop Time 1514    PT Time Calculation (min) 25 min    Activity Tolerance Patient tolerated treatment well    Behavior During Therapy WFL for tasks assessed/performed          Past Medical History:  Diagnosis Date   Anxiety    Bronchitis    Carpal tunnel syndrome on both sides    Constipation    Depression    Family history of adverse reaction to anesthesia    mother had problems waking up from surgery.   GERD (gastroesophageal reflux disease)    Gestational diabetes    Gestational diabetes mellitus    Normal 2 hr GTT postpartum   Headache    migraines   High cholesterol    Hypertension    Infertility, female    Lactose intolerance    Oligohydramnios antepartum 11/09/2017   Palpitations    Panic attack 2017   diagnosed 3 months ago. no meds presently   Reflux    did not filll prescription   S/P percutaneous patent foramen ovale closure 06/16/2020   s/p PFO occluder device with a a 25 MM AMPLATZER by Dr. Wonda   Stroke [facility])    Swelling    Vitamin D  deficiency    Past Surgical History:  Procedure Laterality Date   BREAST EXCISIONAL BIOPSY Right    cyst   BUBBLE STUDY  04/16/2020   Procedure: BUBBLE STUDY;  Surgeon: Raford Riggs, MD;  Location: [facility] ENDOSCOPY;  Service: Cardiovascular;;   DILATION AND EVACUATION N/A 07/26/2015   Procedure: DILATATION AND EVACUATION;  Surgeon: Carlin DELENA Centers, MD;  Location: [facility] ORS;  Service: Gynecology;  Laterality: N/A;   PATENT FORAMEN OVALE(PFO) CLOSURE N/A 06/16/2020   Procedure: PATENT FORAMEN OVALE (PFO) CLOSURE;  Surgeon: Wonda Sharper, MD;  Location: [facility] INVASIVE CV  LAB;  Service: Cardiovascular;  Laterality: N/A;   TEE WITHOUT CARDIOVERSION N/A 04/16/2020   Procedure: TRANSESOPHAGEAL ECHOCARDIOGRAM (TEE);  Surgeon: Raford Riggs, MD;  Location: [facility] ENDOSCOPY;  Service: Cardiovascular;  Laterality: N/A;   Patient Active Problem List   Diagnosis Date Noted   Acute pain of right knee 03/05/2023   Need for hepatitis C screening test 03/05/2023   Screening for colon cancer 03/05/2023   Need for immunization against influenza 03/05/2023   Minor neurocognitive disorder 08/03/2022   Mild vascular neurocognitive disorder 08/03/2022   Dyslipidemia, goal LDL below 70 07/04/2022   Tobacco abuse 07/04/2022   Mild episode of recurrent major depressive disorder 07/04/2022   Encounter for general adult medical examination with abnormal findings 07/04/2022   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   Other hyperlipidemia 08/16/2021   Class 1 obesity with serious comorbidity and body mass index (BMI) of 33.0 to 33.9 in adult 08/16/2021   Abnormal screening mammogram 08/16/2021   Psychophysiological insomnia 07/08/2020   Chronic migraine without aura without status migrainosus, not intractable 07/08/2020   S/P percutaneous patent foramen ovale closure 06/16/2020   PFO (patent foramen ovale) - ROPE score 5 05/31/2020   Hypertension    Vitamin D  deficiency 06/07/2017   REFERRING PROVIDER:  Genelle Standing, MD    REFERRING DIAG:  [phone] (ICD-10-CM) - Patellar instability of right knee    PROCEDURE: 1.  Right knee medial patellofemoral ligament reconstruction 2.  Right knee arthroscopy with medial chondroplasty  Rationale for Evaluation and Treatment: Rehabilitation  THERAPY DIAG:  Acute pain of right knee  Stiffness of right knee, not elsewhere classified  Difficulty in walking, not elsewhere classified  ONSET DATE: DOS 10/18/23   SUBJECTIVE:                                                                                                                                                                                            SUBJECTIVE STATEMENT: Pt reports doing well today. Hardly no pain.   PERTINENT HISTORY:  Anxiety, stroke  PAIN:  Are you having pain? Yes: NPRS scale: 2/10 Pain location: Rt knee Pain description: sore Aggravating factors: walking Relieving factors: ice, meds  PRECAUTIONS:  None  RED FLAGS: None   WEIGHT BEARING RESTRICTIONS:  WBAT  FALLS:  Has patient fallen in last 6 months? No  LIVING ENVIRONMENT: Stairs to basement- spiral  OCCUPATION:  claims  PLOF:  Independent  PATIENT GOALS:  Walk better, decrease pain, walk for exercise   OBJECTIVE:  Note: Objective measures were completed at Evaluation unless otherwise noted.  PATIENT SURVEYS:  LEFS  Extreme difficulty/unable (0), Quite a bit of difficulty (1), Moderate difficulty (2), Little difficulty (3), No difficulty (4) Survey date:  EVAL 12/19/23  Any of your usual work, housework or school activities 0 4  2. Usual hobbies, recreational or sporting activities 0 2  3. Getting into/out of the bath 1 4  4. Walking between rooms 1 3  5. Putting on socks/shoes 1 3  6. Squatting  0 4  7. Lifting an object, like a bag of groceries from the floor 1 4  8. Performing light activities around your home 1 4  9. Performing heavy activities around your home 0 3  10. Getting into/out of a car 1 3  11. Walking 2 blocks 0 3  12. Walking 1 mile 0 3  13. Going up/down 10 stairs (1 flight) 0 3  14. Standing for 1 hour 0 3  15.  sitting for 1 hour 2 3  16. Running on even ground 0 1  17. Running on uneven ground 0 1  18. Making sharp turns while running fast 0 1  19. Hopping  0 1  20. Rolling over in bed 2 4  Score total:  10/80 57/80     COGNITIVE STATUS: Within functional limits for tasks assessed   SENSATION: WFL  EDEMA:  Yes: 45 cm Rt, 41 Lt  GAIT: EVAL: arrived with bilat axillary crutches with brace locked in ext, foot placing on  floor   Body Part #1 Knee  PALPATION: Eval: good patellar mobility  LOWER EXTREMITY ROM:     Active  Right eval 11/30/23 12/05/23  Knee flexion 60 117 AAROM 111  Knee extension 0    Ankle dorsiflexion -4     (Blank rows = not tested)                                                                                                                     TREATMENT DATE:  01/01/24 Life cycle bike x5 min Lateral eccentric step downs 3x10 2 inch box Lunge with back leg on cloth x10  12/21/23 Scifit bike L3 x75min SLR 2x10 STM to medial distal HS PROM R knee Step ups bottom stair 2x10 STS staggered 2x10 R posterior SLDL x10  12/19/23 Reassessment of goals  NuStep x5 min lvl 5 Mini squats 3x10 Step ups x10 Lunge x10  12/14/23 LAQ 3# 3x10 SciFit bike lvl 5 x6 min Mini squats 3x10  Step ups to 6 inch box 3x10   12/05/23 Manual: STM/TPR R quad SAQ 3# 2 x 10 SLR 2 x 10 LAQ 1 x 10 SLS 3 x 30 second holds  11/30/23 PROM ROM, goals  SLR x10 min guard SAQs 2# x10 SLR + ER 2x5 min quad very tremulous Bridges + ABD into red TB x15 Sidelying hip ABD red TB x10 B Education on her current phase in protocol as well as ongoing significant quad weakness. Advised her to continue to wear brace to help prevent knee buckling and avoid advanced activities like running at this point.   8/28: PROM Quads sets 5 x15 SLR x10 with minA Seated self assisted flexion stretch 10sec x5 Partial LAQ 2x10 Standing HR x20 Standing knee flexion (marching and HSC) Gait in hall without brace x174ft  Discussed desensitization techniques and importance of HEP frequency  8/21 Quad sets Passive flexion  NMES with with quad set (50%DC, 25mA, 10/10 5sec ramp) x51min KTC with red physioball SLR with assist from PTA   8/14 Quad sets 5 x10 NMES with quad set (50%DC, 25mA, 10/10 5sec ramp) x45min PROM KTC with red physioball x8min  8/7 Bandage change Education on restrictions and protocol PROM  within pain tolerance Quad sets 5-second hold x 3 minutes Reviewed brace fitting Reviewed weightbearing with crutches Reviewed bed mobility with contralateral LE assist  8/2 EVAL Change bandages See HEP Gait with crutches   PATIENT EDUCATION:  Education details: Anatomy of condition, POC, HEP, exercise form/rationale  Person educated: Patient and Spouse Education method: Explanation, Demonstration, Tactile cues, Verbal cues, and Handouts Education comprehension: verbalized understanding, returned demonstration, verbal cues required, tactile cues required, and needs further education  HOME EXERCISE PROGRAM:  Access Code: [email or web address] URL: [email or web address] Date: 11/30/2023 Prepared by: Josette Rough  Exercises - Long Sitting Quad Set  - 1 x daily - 7 x weekly -  3 sets - 10 reps - Supine Ankle Pumps in Elevation on Pillows  - 1 x daily - 7 x weekly - 3 sets - 10 reps - Seated Hamstring Stretch with Chair  - 1 x daily - 7 x weekly - 3 sets - 10 reps - Seated Heel Slide  - 1 x daily - 7 x weekly - 3 sets - 10 reps - Standing Gluteal Sets  - 1 x daily - 7 x weekly - 3 sets - 10 reps - Active Straight Leg Raise with Quad Set  - 2 x daily - 7 x weekly - 1 sets - 10 reps - Straight Leg Raise with External Rotation  - 2 x daily - 7 x weekly - 1 sets - 5 reps - Supine Bridge with Resistance Band  - 1 x daily - 7 x weekly - 1-2 sets - 10 reps - Sidelying Hip Abduction with Resistance at Thighs  - 1 x daily - 7 x weekly - 1-2 sets - 10 reps    ASSESSMENT:  CLINICAL IMPRESSION: Session limited to late arrival. Tolerated therapy well but had increased swelling towards the end. Began working on eccentric step downs for progressing to return to running. Educated on need for therapy, body mechanics, and activity tolerance. Will continue to benefit from therapy to address remaining limitations and return to prior level of function.   REHAB POTENTIAL: Good  CLINICAL DECISION  MAKING: Stable/uncomplicated  EVALUATION COMPLEXITY: Low   GOALS: Goals reviewed with patient? Yes  SHORT TERM GOALS: Target date: 8/30  Knee ROM 0-90 comfortably Baseline: Goal status: MET 11/30/23  2.  Sit<>stand from elevated chair without compensatory pattern.  Baseline:  Goal status: MET 12/19/23  3.  Ambulation without AD, proper form Baseline:  Goal status: MET 12/19/23    LONG TERM GOALS: Target date: POC date 01/12/24  Reciprocal stair navigation with good form and pain <=2/10 Baseline:  Goal status: ONGOING 12/19/23  2.  Strength 80% of contralateral leg via HHD testing Baseline:  Goal status: ONGOING 12/19/23  3.  Return to walking program for regular exercise Baseline:  Goal status: MET 12/19/23  4.  Average knee pain with daily activities <=2/10 Baseline:  Goal status: ONGOING (4/10) 12/19/23    PLAN:  PT FREQUENCY: 1-2x/week  PT DURATION: POC date  PLANNED INTERVENTIONS: 97164- PT Re-evaluation, 97750- Physical Performance Testing, 97110-Therapeutic exercises, 97530- Therapeutic activity, [id number]- Neuromuscular re-education, 97535- Self Care, 02859- Manual therapy, [phone]- Gait training, [phone]- Aquatic Therapy, Patient/Family education, Balance training, Stair training, Taping, Joint mobilization, Spinal mobilization, Scar mobilization, and Cryotherapy.  PLAN FOR NEXT SESSION: per protocol- [place]  State, how do HEP updates feel?   PROTOCOL: [email or web address]?la=[email or web address]/a  Lili Finder, Student-PT 01/02/2024, 3:14 PM  This entire session was performed under direct supervision and direction of a licensed therapist/therapist assistant . I have personally read, edited and approve of the note as written. 3:23 PM, 01/02/24 Prentice CANDIE Stains PT, DPT Physical Therapist at  [facility]

## 2024-01-04 ENCOUNTER — Ambulatory Visit (HOSPITAL_BASED_OUTPATIENT_CLINIC_OR_DEPARTMENT_OTHER): Admitting: Physical Therapy

## 2024-01-04 ENCOUNTER — Ambulatory Visit (INDEPENDENT_AMBULATORY_CARE_PROVIDER_SITE_OTHER): Admitting: Orthopaedic Surgery

## 2024-01-04 ENCOUNTER — Other Ambulatory Visit (HOSPITAL_BASED_OUTPATIENT_CLINIC_OR_DEPARTMENT_OTHER): Payer: Self-pay

## 2024-01-04 DIAGNOSIS — M25361 Other instability, right knee: Secondary | ICD-10-CM

## 2024-01-04 MED ORDER — MELOXICAM 15 MG PO TABS
15.0000 mg | ORAL_TABLET | Freq: Every day | ORAL | 0 refills | Status: AC
  Filled 2024-01-04: qty 30, 30d supply, fill #0

## 2024-01-04 NOTE — Progress Notes (Signed)
 Post Operative Evaluation    Procedure/Date of Surgery: Right knee MPFL reconstruction 7/31  Interval History:  Presents 10 weeks status post above procedure.  At this time her knee is continuing to improve.  She is still getting some swelling in the right knee with physical therapy although her motion and stability are continuing to improve   PMH/PSH/Family History/Social History/Meds/Allergies:    Past Medical History:  Diagnosis Date   Anxiety    Bronchitis    Carpal tunnel syndrome on both sides    Constipation    Depression    Family history of adverse reaction to anesthesia    mother had problems waking up from surgery.   GERD (gastroesophageal reflux disease)    Gestational diabetes    Gestational diabetes mellitus    Normal 2 hr GTT postpartum   Headache    migraines   High cholesterol    Hypertension    Infertility, female    Lactose intolerance    Oligohydramnios antepartum 11/09/2017   Palpitations    Panic attack 2017   diagnosed 3 months ago. no meds presently   Reflux    did not filll prescription   S/P percutaneous patent foramen ovale closure 06/16/2020   s/p PFO occluder device with a a 25 MM AMPLATZER by Dr. Wonda   Stroke [facility])    Swelling    Vitamin D  deficiency    Past Surgical History:  Procedure Laterality Date   BREAST EXCISIONAL BIOPSY Right    cyst   BUBBLE STUDY  04/16/2020   Procedure: BUBBLE STUDY;  Surgeon: Raford Riggs, MD;  Location: [facility] ENDOSCOPY;  Service: Cardiovascular;;   DILATION AND EVACUATION N/A 07/26/2015   Procedure: DILATATION AND EVACUATION;  Surgeon: Carlin DELENA Centers, MD;  Location: [facility] ORS;  Service: Gynecology;  Laterality: N/A;   PATENT FORAMEN OVALE(PFO) CLOSURE N/A 06/16/2020   Procedure: PATENT FORAMEN OVALE (PFO) CLOSURE;  Surgeon: Wonda Sharper, MD;  Location: [facility] INVASIVE CV LAB;  Service: Cardiovascular;  Laterality: N/A;   TEE WITHOUT CARDIOVERSION N/A 04/16/2020    Procedure: TRANSESOPHAGEAL ECHOCARDIOGRAM (TEE);  Surgeon: Raford Riggs, MD;  Location: [facility] ENDOSCOPY;  Service: Cardiovascular;  Laterality: N/A;   Social History   Socioeconomic History   Marital status: Married    Spouse name: Not on file   Number of children: Not on file   Years of education: Not on file   Highest education level: Some college, no degree  Occupational History   Occupation: Not Working  Tobacco Use   Smoking status: Every Day    Current packs/day: 0.25    Types: Cigarettes   Smokeless tobacco: Never   Tobacco comments:    4 cig/day  Vaping Use   Vaping status: Never Used  Substance and Sexual Activity   Alcohol use: Yes    Comment: wine occ   Drug use: No   Sexual activity: Yes    Birth control/protection: I.U.D.  Other Topics Concern   Not on file  Social History Narrative   Lives at home with spouse and her 2 children   Right handed   Caffeine: 2 cups/day   Pt works   Social Drivers of Corporate investment banker Strain: Low Risk  (07/04/2022)   Overall Financial Resource Strain (CARDIA)    Difficulty of Paying Living Expenses: Not  hard at all  Food Insecurity: No Food Insecurity (07/04/2022)   Hunger Vital Sign    Worried About Running Out of Food in the Last Year: Never true    Ran Out of Food in the Last Year: Never true  Transportation Needs: No Transportation Needs (07/04/2022)   PRAPARE - Administrator, Civil Service (Medical): No    Lack of Transportation (Non-Medical): No  Physical Activity: Insufficiently Active (07/04/2022)   Exercise Vital Sign    Days of Exercise per Week: 3 days    Minutes of Exercise per Session: 30 min  Stress: Stress Concern Present (07/04/2022)   [employer] of Occupational Health - Occupational Stress Questionnaire    Feeling of Stress : Very much  Social Connections: Moderately Integrated (07/04/2022)   Social Connection and Isolation Panel    Frequency of Communication with Friends and  Family: More than three times a week    Frequency of Social Gatherings with Friends and Family: Twice a week    Attends Religious Services: More than 4 times per year    Active Member of [employer] or Organizations: No    Attends Engineer, structural: Not on file    Marital Status: Married   Family History  Problem Relation Age of Onset   Hypertension Mother    Stroke Mother    Anxiety disorder Father    Depression Father    Alcoholism Father    Diabetes Maternal Aunt    Diabetes Maternal Uncle    Breast cancer Paternal Aunt    Stroke Maternal Grandmother    No Known Allergies Current Outpatient Medications  Medication Sig Dispense Refill   meloxicam (MOBIC) 15 MG tablet Take 1 tablet (15 mg total) by mouth daily. 30 tablet 0   aspirin  81 MG EC tablet Take 81 mg by mouth daily. Swallow whole.     aspirin  EC 325 MG tablet Take 1 tablet (325 mg total) by mouth daily. 14 tablet 0   brompheniramine-pseudoephedrine-DM 30-2-10 MG/5ML syrup Take 5 mLs by mouth 4 (four) times daily as needed. 240 mL 0   buPROPion  (WELLBUTRIN  XL) 150 MG 24 hr tablet Take 1 tablet (150 mg total) by mouth daily. 90 tablet 0   levonorgestrel  (MIRENA , 52 MG,) 20 MCG/24HR IUD 1 Intra Uterine Device (1 each total) by Intrauterine route once for 1 dose. 1 each 0   lisinopril  (ZESTRIL ) 20 MG tablet Take 1.5 tablets (30 mg total) by mouth daily. 135 tablet 2   Multiple Vitamins-Minerals (MULTIVITAMIN WITH MINERALS) tablet Take 1 tablet by mouth daily.     ondansetron  (ZOFRAN ) 4 MG tablet Take 1 tablet (4 mg total) by mouth every 8 (eight) hours as needed for nausea or vomiting. 20 tablet 0   oxyCODONE  (ROXICODONE ) 5 MG immediate release tablet Take 1 tablet (5 mg total) by mouth every 4 (four) hours as needed for severe pain (pain score 7-10) or breakthrough pain. 10 tablet 0   rosuvastatin  (CRESTOR ) 10 MG tablet Take 1 tablet (10 mg total) by mouth daily. 90 tablet 2   sertraline  (ZOLOFT ) 50 MG tablet Take 1  tablet (50 mg total) by mouth daily. 30 tablet 0   traZODone  (DESYREL ) 50 MG tablet Take 1-1.5 tablets (50-75 mg total) by mouth at bedtime. 45 tablet 11   varenicline  (CHANTIX ) 0.5 MG tablet Take 1 tablet (0.5 mg total) by mouth 2 (two) times daily. 12 tablet 0   varenicline  (CHANTIX ) 1 MG tablet Take 1 tablet (1 mg total)  by mouth 2 (two) times daily. 168 tablet 0   Vitamin D , Ergocalciferol , (DRISDOL ) 1.25 MG (50000 UNIT) CAPS capsule Take 1 capsule (50,000 Units total) by mouth every 7 (seven) days. 12 capsule 3   No current facility-administered medications for this visit.   No results found.  Review of Systems:   A ROS was performed including pertinent positives and negatives as documented in the HPI.   Musculoskeletal Exam:    There were no vitals taken for this visit.  Right knee well-appearing incisions are clean dry intact.  Range of motion is from 0 to 45 degrees.  There is no effusion.  No joint line tenderness  Imaging:      I personally reviewed and interpreted the radiographs.   Assessment:   10 weeks status post right knee MPFL reconstruction overall doing well.  At this time she will continue to work on strengthening and range of motion.  I will plan to send her prescription for Mobic to assist with swelling.  I will plan to see her back in 8 weeks for reassessment  Plan :    - Return to clinic 8 weeks for reassessment      I personally saw and evaluated the patient, and participated in the management and treatment plan.  Elspeth Parker, MD Attending Physician, Orthopedic Surgery  This document was dictated using Dragon voice recognition software. A reasonable attempt at proof reading has been made to minimize errors.

## 2024-01-07 ENCOUNTER — Ambulatory Visit
Admission: RE | Admit: 2024-01-07 | Discharge: 2024-01-07 | Disposition: A | Discharge status: Home / Self Care | Source: Ambulatory Visit

## 2024-01-07 ENCOUNTER — Ambulatory Visit (HOSPITAL_BASED_OUTPATIENT_CLINIC_OR_DEPARTMENT_OTHER): Admitting: Family

## 2024-01-07 DIAGNOSIS — Z1231 Encounter for screening mammogram for malignant neoplasm of breast: Secondary | ICD-10-CM

## 2024-01-08 ENCOUNTER — Ambulatory Visit (HOSPITAL_BASED_OUTPATIENT_CLINIC_OR_DEPARTMENT_OTHER): Admitting: Physical Therapy

## 2024-01-08 ENCOUNTER — Encounter (HOSPITAL_BASED_OUTPATIENT_CLINIC_OR_DEPARTMENT_OTHER): Payer: Self-pay | Admitting: Physical Therapy

## 2024-01-08 DIAGNOSIS — R262 Difficulty in walking, not elsewhere classified: Secondary | ICD-10-CM

## 2024-01-08 DIAGNOSIS — M25561 Pain in right knee: Secondary | ICD-10-CM

## 2024-01-08 DIAGNOSIS — M25661 Stiffness of right knee, not elsewhere classified: Secondary | ICD-10-CM

## 2024-01-08 NOTE — Therapy (Signed)
 OUTPATIENT PHYSICAL THERAPY TREATMENT AND RECERT    Patient Name: Tracey [name] MRN: [id number] DOB:03/30/1975, 48 y.o., female Today's Date: 01/08/2024  END OF SESSION:  PT End of Session - 01/08/24 1326     Visit Number 12    Number of Visits 25    Date for Recertification  02/19/24    Authorization Type BCBS    PT Start Time 1307    PT Stop Time 1345    PT Time Calculation (min) 38 min    Activity Tolerance Patient tolerated treatment well    Behavior During Therapy WFL for tasks assessed/performed           Past Medical History:  Diagnosis Date   Anxiety    Bronchitis    Carpal tunnel syndrome on both sides    Constipation    Depression    Family history of adverse reaction to anesthesia    mother had problems waking up from surgery.   GERD (gastroesophageal reflux disease)    Gestational diabetes    Gestational diabetes mellitus    Normal 2 hr GTT postpartum   Headache    migraines   High cholesterol    Hypertension    Infertility, female    Lactose intolerance    Oligohydramnios antepartum 11/09/2017   Palpitations    Panic attack 2017   diagnosed 3 months ago. no meds presently   Reflux    did not filll prescription   S/P percutaneous patent foramen ovale closure 06/16/2020   s/p PFO occluder device with a a 25 MM AMPLATZER by Dr. Wonda   Stroke [facility])    Swelling    Vitamin D  deficiency    Past Surgical History:  Procedure Laterality Date   BREAST EXCISIONAL BIOPSY Right    cyst   BUBBLE STUDY  04/16/2020   Procedure: BUBBLE STUDY;  Surgeon: Raford Riggs, MD;  Location: [facility] ENDOSCOPY;  Service: Cardiovascular;;   DILATION AND EVACUATION N/A 07/26/2015   Procedure: DILATATION AND EVACUATION;  Surgeon: Carlin DELENA Centers, MD;  Location: [facility] ORS;  Service: Gynecology;  Laterality: N/A;   PATENT FORAMEN OVALE(PFO) CLOSURE N/A 06/16/2020   Procedure: PATENT FORAMEN OVALE (PFO) CLOSURE;  Surgeon: Wonda Sharper, MD;  Location: [facility] INVASIVE CV  LAB;  Service: Cardiovascular;  Laterality: N/A;   TEE WITHOUT CARDIOVERSION N/A 04/16/2020   Procedure: TRANSESOPHAGEAL ECHOCARDIOGRAM (TEE);  Surgeon: Raford Riggs, MD;  Location: [facility] ENDOSCOPY;  Service: Cardiovascular;  Laterality: N/A;   Patient Active Problem List   Diagnosis Date Noted   Acute pain of right knee 03/05/2023   Need for hepatitis C screening test 03/05/2023   Screening for colon cancer 03/05/2023   Need for immunization against influenza 03/05/2023   Minor neurocognitive disorder 08/03/2022   Mild vascular neurocognitive disorder 08/03/2022   Dyslipidemia, goal LDL below 70 07/04/2022   Tobacco abuse 07/04/2022   Mild episode of recurrent major depressive disorder 07/04/2022   Encounter for general adult medical examination with abnormal findings 07/04/2022   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   Other hyperlipidemia 08/16/2021   Class 1 obesity with serious comorbidity and body mass index (BMI) of 33.0 to 33.9 in adult 08/16/2021   Abnormal screening mammogram 08/16/2021   Psychophysiological insomnia 07/08/2020   Chronic migraine without aura without status migrainosus, not intractable 07/08/2020   S/P percutaneous patent foramen ovale closure 06/16/2020   PFO (patent foramen ovale) - ROPE score 5 05/31/2020   Hypertension    Vitamin D  deficiency 06/07/2017   REFERRING PROVIDER:  Genelle Standing, MD    REFERRING DIAG:  [phone] (ICD-10-CM) - Patellar instability of right knee    PROCEDURE: 1.  Right knee medial patellofemoral ligament reconstruction 2.  Right knee arthroscopy with medial chondroplasty  Rationale for Evaluation and Treatment: Rehabilitation  THERAPY DIAG:  Acute pain of right knee  Stiffness of right knee, not elsewhere classified  Difficulty in walking, not elsewhere classified  ONSET DATE: DOS 10/18/23   SUBJECTIVE:                                                                                                                                                                                            SUBJECTIVE STATEMENT:  Everything's feeling good, I wore my compression stocking today, saw the doctor and he said swelling is from the keloid and prescribed Mobic. Its not as inflamed since taking Mobic.      PERTINENT HISTORY:  Anxiety, stroke  PAIN:  Are you having pain? No   PRECAUTIONS:  None  RED FLAGS: None   WEIGHT BEARING RESTRICTIONS:  WBAT  FALLS:  Has patient fallen in last 6 months? No  LIVING ENVIRONMENT: Stairs to basement- spiral  OCCUPATION:  claims  PLOF:  Independent  PATIENT GOALS:  Walk better, decrease pain, walk for exercise   OBJECTIVE:  Note: Objective measures were completed at Evaluation unless otherwise noted.  PATIENT SURVEYS:  LEFS  Extreme difficulty/unable (0), Quite a bit of difficulty (1), Moderate difficulty (2), Little difficulty (3), No difficulty (4) Survey date:  EVAL 12/19/23  Any of your usual work, housework or school activities 0 4  2. Usual hobbies, recreational or sporting activities 0 2  3. Getting into/out of the bath 1 4  4. Walking between rooms 1 3  5. Putting on socks/shoes 1 3  6. Squatting  0 4  7. Lifting an object, like a bag of groceries from the floor 1 4  8. Performing light activities around your home 1 4  9. Performing heavy activities around your home 0 3  10. Getting into/out of a car 1 3  11. Walking 2 blocks 0 3  12. Walking 1 mile 0 3  13. Going up/down 10 stairs (1 flight) 0 3  14. Standing for 1 hour 0 3  15.  sitting for 1 hour 2 3  16. Running on even ground 0 1  17. Running on uneven ground 0 1  18. Making sharp turns while running fast 0 1  19. Hopping  0 1  20. Rolling over in bed 2 4  Score total:  10/80 57/80     COGNITIVE STATUS: Within functional  limits for tasks assessed   SENSATION: WFL  EDEMA:  Yes: 45 cm Rt, 41 Lt  GAIT: EVAL: arrived with bilat axillary crutches with brace locked in ext, foot  placing on floor   Body Part #1 Knee  PALPATION: Eval: good patellar mobility  LOWER EXTREMITY ROM:     Active  Right eval 11/30/23 12/05/23 01/08/24  Knee flexion 60 117 AAROM 111 114* AROM seated   Knee extension 0     Ankle dorsiflexion -4      (Blank rows = not tested)      STRENGTH   01/08/24 R  01/08/24 L   Hip ABD     Hip extension     Knee extension  4 5  Knee flexion  4 4+                                                                                                                       TREATMENT DATE:   01/08/24  Scifit bike L4.5 x6 minutes for w/u  ROM and MMT, goals for recert Shuttle LE press double leg 100# 2x12  Shuttle LE press single leg R 50# eccentric lower 2x12  Forward step downs 2 inch box 2x12  STS with eccentric lower from high mat table 2x10     PATIENT EDUCATION:  Education details: Teacher, music of condition, POC, HEP, exercise form/rationale  Person educated: Patient and Spouse Education method: Explanation, Demonstration, Tactile cues, Verbal cues, and Handouts Education comprehension: verbalized understanding, returned demonstration, verbal cues required, tactile cues required, and needs further education  HOME EXERCISE PROGRAM:  Access Code: [email or web address] URL: [email or web address] Date: 11/30/2023 Prepared by: Josette Rough  Exercises - Long Sitting Quad Set  - 1 x daily - 7 x weekly - 3 sets - 10 reps - Supine Ankle Pumps in Elevation on Pillows  - 1 x daily - 7 x weekly - 3 sets - 10 reps - Seated Hamstring Stretch with Chair  - 1 x daily - 7 x weekly - 3 sets - 10 reps - Seated Heel Slide  - 1 x daily - 7 x weekly - 3 sets - 10 reps - Standing Gluteal Sets  - 1 x daily - 7 x weekly - 3 sets - 10 reps - Active Straight Leg Raise with Quad Set  - 2 x daily - 7 x weekly - 1 sets - 10 reps - Straight Leg Raise with External Rotation  - 2 x daily - 7 x weekly - 1 sets - 5 reps - Supine Bridge with Resistance Band  - 1 x  daily - 7 x weekly - 1-2 sets - 10 reps - Sidelying Hip Abduction with Resistance at Thighs  - 1 x daily - 7 x weekly - 1-2 sets - 10 reps    ASSESSMENT:  CLINICAL IMPRESSION:  Arrives today doing OK, we updated objectives and goals for recert this visit and otherwise progressed all exercises and interventions as tolerated today. Did well today, we will  continue to challenge her as appropriate per protocol. Still very limited with R quad strength especially eccentrically, which may be contributing to her ongoing pain.    REHAB POTENTIAL: Good  CLINICAL DECISION MAKING: Stable/uncomplicated  EVALUATION COMPLEXITY: Low   GOALS: Goals reviewed with patient? Yes  SHORT TERM GOALS: Target date: 8/30  Knee ROM 0-90 comfortably Baseline: Goal status: MET 11/30/23  2.  Sit<>stand from elevated chair without compensatory pattern.  Baseline:  Goal status: MET 12/19/23  3.  Ambulation without AD, proper form Baseline:  Goal status: MET 12/19/23    LONG TERM GOALS: Target date: 02/19/2024    Reciprocal stair navigation with good form and pain <=2/10 Baseline:  Goal status: ONGOING 01/08/24  2.  Strength 80% of contralateral leg via HHD testing Baseline:  Goal status: ONGOING 01/08/24  3.  Return to walking program for regular exercise Baseline:  Goal status: MET 12/19/23  4.  Average knee pain with daily activities <=2/10 Baseline:  Goal status: ONGOING (4/10) 01/08/24    PLAN:  PT FREQUENCY: 1-2x/week  PT DURATION: 6 weeks   PLANNED INTERVENTIONS: 02835- PT Re-evaluation, 97750- Physical Performance Testing, 97110-Therapeutic exercises, 97530- Therapeutic activity, [id number]- Neuromuscular re-education, 97535- Self Care, 02859- Manual therapy, [phone]- Gait training, [phone]- Aquatic Therapy, Patient/Family education, Balance training, Stair training, Taping, Joint mobilization, Spinal mobilization, Scar mobilization, and Cryotherapy.  PLAN FOR NEXT SESSION: per protocol-  [place], how do HEP updates feel? Quad strengthening   PROTOCOL: [email or web address]?la=en%3C/a  Josette Rough, PT, DPT 01/08/24 1:47 PM

## 2024-01-15 ENCOUNTER — Encounter: Attending: Psychology | Admitting: Psychology

## 2024-01-15 DIAGNOSIS — G3184 Mild cognitive impairment, so stated: Secondary | ICD-10-CM | POA: Insufficient documentation

## 2024-01-15 NOTE — Progress Notes (Signed)
 Patient: Tracey [name]  MRN: [id number] DOB: May 24, 1975   Provider/Clinical Neuropsychologist: Evalene DOROTHA Riff, PsyD Location of Service: [facility] Physical Medicine & Rehabilitation Department [facility] [address], [address] [place], [place] [address] Phone: [phone]  Date:01/15/24 Start: 8:10 AM End: 8:37 AM  Patient seen face to face with provider. Patient was scheduled for New Patient appointment for neuropsychological evaluation but had actually completed a comprehensive neuropsychological evaluation in March/April 2025 through [facility], identified in preliminary record review for case preparation.   During interaction with provider, the patient reported interest in follow-up services to address functional concerns related to MCI diagnosis. In discussion with patient and reviewing prior evaluation report together, clinical research associate highlighted alignment with patient's request and the conclusions/recommendations made by Dr. Authur of [facility]. The patient had some uncertainty/confusion surrounding the recommendations that the provider was able to clarify through psychoeducation. The patient had some additional questions about the findings from that report.   The provider recommended the patient reach out to Dr. Authur regarding questions best answered by the attending neuropsychologist. The provider also encouraged the patient to reach out to them regarding following through on the treatment recommendations outlined in that report. The patient was amenable to both.   The provided explained why it would be too soon to conduct a re-evaluation in the absence of extenuating circumstances, but that the patient could come for a re-evaluation 63-months following the prior, which the patient expressed interest in. The writer encouraged patient follow-through on prior recommendations given benefit it would have to assessment clarity. The patient expressed  her understanding and appreciation.    Evalene DOROTHA Riff, PsyD

## 2024-01-21 ENCOUNTER — Encounter: Payer: Self-pay | Admitting: Radiology

## 2024-01-24 ENCOUNTER — Other Ambulatory Visit (HOSPITAL_BASED_OUTPATIENT_CLINIC_OR_DEPARTMENT_OTHER): Payer: Self-pay

## 2024-02-05 ENCOUNTER — Encounter (HOSPITAL_BASED_OUTPATIENT_CLINIC_OR_DEPARTMENT_OTHER): Payer: Self-pay | Admitting: Physical Therapy

## 2024-02-05 ENCOUNTER — Ambulatory Visit (HOSPITAL_BASED_OUTPATIENT_CLINIC_OR_DEPARTMENT_OTHER): Attending: Orthopaedic Surgery | Admitting: Physical Therapy

## 2024-02-05 DIAGNOSIS — M25661 Stiffness of right knee, not elsewhere classified: Secondary | ICD-10-CM | POA: Insufficient documentation

## 2024-02-05 DIAGNOSIS — R262 Difficulty in walking, not elsewhere classified: Secondary | ICD-10-CM | POA: Insufficient documentation

## 2024-02-05 DIAGNOSIS — M25561 Pain in right knee: Secondary | ICD-10-CM | POA: Diagnosis present

## 2024-02-05 NOTE — Therapy (Signed)
 OUTPATIENT PHYSICAL THERAPY TREATMENT   Patient Name: Tracey [name] MRN: [id number] DOB:1975/03/30, 48 y.o., female Today's Date: 02/05/2024  END OF SESSION:  PT End of Session - 02/05/24 1449     Visit Number 13    Number of Visits 25    Date for Recertification  02/19/24    Authorization Type BCBS    PT Start Time 1450    PT Stop Time 1528    PT Time Calculation (min) 38 min    Behavior During Therapy WFL for tasks assessed/performed           Past Medical History:  Diagnosis Date   Anxiety    Bronchitis    Carpal tunnel syndrome on both sides    Constipation    Depression    Family history of adverse reaction to anesthesia    mother had problems waking up from surgery.   GERD (gastroesophageal reflux disease)    Gestational diabetes    Gestational diabetes mellitus    Normal 2 hr GTT postpartum   Headache    migraines   High cholesterol    Hypertension    Infertility, female    Lactose intolerance    Oligohydramnios antepartum 11/09/2017   Palpitations    Panic attack 2017   diagnosed 3 months ago. no meds presently   Reflux    did not filll prescription   S/P percutaneous patent foramen ovale closure 06/16/2020   s/p PFO occluder device with a a 25 MM AMPLATZER by Dr. Wonda   Stroke [facility])    Swelling    Vitamin D  deficiency    Past Surgical History:  Procedure Laterality Date   BREAST EXCISIONAL BIOPSY Right    cyst   BUBBLE STUDY  04/16/2020   Procedure: BUBBLE STUDY;  Surgeon: Raford Riggs, MD;  Location: [facility] ENDOSCOPY;  Service: Cardiovascular;;   DILATION AND EVACUATION N/A 07/26/2015   Procedure: DILATATION AND EVACUATION;  Surgeon: Carlin DELENA Centers, MD;  Location: [facility] ORS;  Service: Gynecology;  Laterality: N/A;   PATENT FORAMEN OVALE(PFO) CLOSURE N/A 06/16/2020   Procedure: PATENT FORAMEN OVALE (PFO) CLOSURE;  Surgeon: Wonda Sharper, MD;  Location: [facility] INVASIVE CV LAB;  Service: Cardiovascular;  Laterality: N/A;   TEE WITHOUT  CARDIOVERSION N/A 04/16/2020   Procedure: TRANSESOPHAGEAL ECHOCARDIOGRAM (TEE);  Surgeon: Raford Riggs, MD;  Location: [facility] ENDOSCOPY;  Service: Cardiovascular;  Laterality: N/A;   Patient Active Problem List   Diagnosis Date Noted   Acute pain of right knee 03/05/2023   Need for hepatitis C screening test 03/05/2023   Screening for colon cancer 03/05/2023   Need for immunization against influenza 03/05/2023   Minor neurocognitive disorder 08/03/2022   Mild vascular neurocognitive disorder 08/03/2022   Dyslipidemia, goal LDL below 70 07/04/2022   Tobacco abuse 07/04/2022   Mild episode of recurrent major depressive disorder 07/04/2022   Encounter for general adult medical examination with abnormal findings 07/04/2022   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   Other hyperlipidemia 08/16/2021   Class 1 obesity with serious comorbidity and body mass index (BMI) of 33.0 to 33.9 in adult 08/16/2021   Abnormal screening mammogram 08/16/2021   Psychophysiological insomnia 07/08/2020   Chronic migraine without aura without status migrainosus, not intractable 07/08/2020   S/P percutaneous patent foramen ovale closure 06/16/2020   PFO (patent foramen ovale) - ROPE score 5 05/31/2020   Hypertension    Vitamin D  deficiency 06/07/2017   REFERRING PROVIDER:  Genelle Standing, MD    REFERRING DIAG:  M25.361 (ICD-10-CM) -  Patellar instability of right knee    PROCEDURE: 1.  Right knee medial patellofemoral ligament reconstruction 2.  Right knee arthroscopy with medial chondroplasty  Rationale for Evaluation and Treatment: Rehabilitation  THERAPY DIAG:  Acute pain of right knee  Stiffness of right knee, not elsewhere classified  Difficulty in walking, not elsewhere classified  ONSET DATE: DOS 10/18/23   SUBJECTIVE:                                                                                                                                                                                            SUBJECTIVE STATEMENT: Pt reports she has not been doing much of the HEP but is staying active with her kids.  She has returned to walking in neighborhood and has tried short trials of jogging (on back deck).   PERTINENT HISTORY:  Anxiety, stroke  PAIN:  Are you having pain? No   PRECAUTIONS:  None  RED FLAGS: None   WEIGHT BEARING RESTRICTIONS:  WBAT  FALLS:  Has patient fallen in last 6 months? No  LIVING ENVIRONMENT: Stairs to basement- spiral  OCCUPATION:  claims  PLOF:  Independent  PATIENT GOALS:  Walk better, decrease pain, walk for exercise   OBJECTIVE:  Note: Objective measures were completed at Evaluation unless otherwise noted.  PATIENT SURVEYS:  LEFS  Extreme difficulty/unable (0), Quite a bit of difficulty (1), Moderate difficulty (2), Little difficulty (3), No difficulty (4) Survey date:  EVAL 12/19/23  Any of your usual work, housework or school activities 0 4  2. Usual hobbies, recreational or sporting activities 0 2  3. Getting into/out of the bath 1 4  4. Walking between rooms 1 3  5. Putting on socks/shoes 1 3  6. Squatting  0 4  7. Lifting an object, like a bag of groceries from the floor 1 4  8. Performing light activities around your home 1 4  9. Performing heavy activities around your home 0 3  10. Getting into/out of a car 1 3  11. Walking 2 blocks 0 3  12. Walking 1 mile 0 3  13. Going up/down 10 stairs (1 flight) 0 3  14. Standing for 1 hour 0 3  15.  sitting for 1 hour 2 3  16. Running on even ground 0 1  17. Running on uneven ground 0 1  18. Making sharp turns while running fast 0 1  19. Hopping  0 1  20. Rolling over in bed 2 4  Score total:  10/80 57/80     COGNITIVE STATUS: Within functional limits for tasks assessed   SENSATION: WFL  EDEMA:  Yes: 45 cm Rt, 41 Lt  GAIT: EVAL: arrived with bilat axillary crutches with brace locked in ext, foot placing on floor   Body Part #1 Knee  PALPATION: Eval: good  patellar mobility  LOWER EXTREMITY ROM:     Active  Right eval 11/30/23 12/05/23 01/08/24  Knee flexion 60 117 AAROM 111 114* AROM seated   Knee extension 0     Ankle dorsiflexion -4      (Blank rows = not tested)      STRENGTH   01/08/24 R  01/08/24 L   Hip ABD     Hip extension     Knee extension  4 5  Knee flexion  4 4+                                                                                                                       TREATMENT DATE:  02/05/24 -NuStep L5: UE/LE x 6 min Rt forward step ups x 10; Rt lateral step ups x 10  -Bil heel raises with heels off of step x 10 -STS with eccentric lowering in staggered stance to standard chair with forward arm reach x 10 - slow and controlled motion -Standing quad stretch holding shoe, 20s x 2 -> trial of seated quad stretch with foot under chair x 20s - Lt forward step down, Rt retro step up 6 with slow and controlled motion x 8 with UE on rail   01/08/24  Scifit bike L4.5 x6 minutes for w/u  ROM and MMT, goals for recert Shuttle LE press double leg 100# 2x12  Shuttle LE press single leg R 50# eccentric lower 2x12  Forward step downs 2 inch box 2x12  STS with eccentric lower from high mat table 2x10     PATIENT EDUCATION:  Education details: Teacher, Music of condition, POC, HEP, exercise form/rationale  Person educated: Patient and Spouse Education method: Explanation, Demonstration, Tactile cues, Verbal cues, and Handouts Education comprehension: verbalized understanding, returned demonstration, verbal cues required, tactile cues required, and needs further education  HOME EXERCISE PROGRAM:  Access Code: [email or web address] URL: [email or web address] Date: 11/30/2023 Prepared by: Josette Rough  ASSESSMENT:  CLINICAL IMPRESSION: Pt is 15wk and 5 days s/p R MPFL surgery.  Focus of session on functional strengthening of R quad.  She tolerated all exercises without pain, just fatigue.  Therapist to update  her HEP next visit. Will continue to progress towards remaining LTGs.    REHAB POTENTIAL: Good  CLINICAL DECISION MAKING: Stable/uncomplicated  EVALUATION COMPLEXITY: Low   GOALS: Goals reviewed with patient? Yes  SHORT TERM GOALS: Target date: 8/30  Knee ROM 0-90 comfortably Baseline: Goal status: MET 11/30/23  2.  Sit<>stand from elevated chair without compensatory pattern.  Baseline:  Goal status: MET 12/19/23  3.  Ambulation without AD, proper form Baseline:  Goal status: MET 12/19/23    LONG TERM GOALS: Target date: 02/19/2024    Reciprocal stair navigation with good form and pain <=2/10 Baseline:  Goal status: ONGOING 01/08/24  2.  Strength 80% of contralateral leg via HHD testing Baseline:  Goal status: ONGOING 01/08/24  3.  Return to walking program for regular exercise Baseline:  Goal status: MET 12/19/23  4.  Average knee pain with daily activities <=2/10 Baseline:  Goal status: ONGOING (4/10) 01/08/24    PLAN:  PT FREQUENCY: 1-2x/week  PT DURATION: 6 weeks   PLANNED INTERVENTIONS: 02835- PT Re-evaluation, 97750- Physical Performance Testing, 97110-Therapeutic exercises, 97530- Therapeutic activity, [id number]- Neuromuscular re-education, 97535- Self Care, 02859- Manual therapy, [phone]- Gait training, [phone]- Aquatic Therapy, Patient/Family education, Balance training, Stair training, Taping, Joint mobilization, Spinal mobilization, Scar mobilization, and Cryotherapy.  PLAN FOR NEXT SESSION: Quad strengthening   PROTOCOL: [email or web address]?la=en%3C/a  Delon Aquas, PTA 02/05/24 6:00 PM [facility] [address] [place], [place], [address] Phone: [phone]   Fax:  [phone]

## 2024-02-12 ENCOUNTER — Ambulatory Visit (HOSPITAL_BASED_OUTPATIENT_CLINIC_OR_DEPARTMENT_OTHER): Admitting: Physical Therapy

## 2024-02-12 ENCOUNTER — Encounter (HOSPITAL_BASED_OUTPATIENT_CLINIC_OR_DEPARTMENT_OTHER): Payer: Self-pay | Admitting: Physical Therapy

## 2024-02-12 DIAGNOSIS — M25561 Pain in right knee: Secondary | ICD-10-CM

## 2024-02-12 DIAGNOSIS — R262 Difficulty in walking, not elsewhere classified: Secondary | ICD-10-CM

## 2024-02-12 DIAGNOSIS — M25661 Stiffness of right knee, not elsewhere classified: Secondary | ICD-10-CM

## 2024-02-12 NOTE — Therapy (Addendum)
 OUTPATIENT PHYSICAL THERAPY TREATMENT/PROGRESS NOTE  Progress Note   Reporting Period 01/08/24 to 02/12/24   See note below for Objective Data and Assessment of Progress/Goals   Patient Name: Tracey [name] MRN: [id number] DOB:Jan 21, 1976, 48 y.o., female Today's Date: 02/12/2024  END OF SESSION:  PT End of Session - 02/12/24 1438     Visit Number 14    Number of Visits 25    Date for Recertification  04/08/24    Authorization Type BCBS    PT Start Time 1437    PT Stop Time 1517    PT Time Calculation (min) 40 min    Activity Tolerance Patient tolerated treatment well    Behavior During Therapy WFL for tasks assessed/performed            Past Medical History:  Diagnosis Date   Anxiety    Bronchitis    Carpal tunnel syndrome on both sides    Constipation    Depression    Family history of adverse reaction to anesthesia    mother had problems waking up from surgery.   GERD (gastroesophageal reflux disease)    Gestational diabetes    Gestational diabetes mellitus    Normal 2 hr GTT postpartum   Headache    migraines   High cholesterol    Hypertension    Infertility, female    Lactose intolerance    Oligohydramnios antepartum 11/09/2017   Palpitations    Panic attack 2017   diagnosed 3 months ago. no meds presently   Reflux    did not filll prescription   S/P percutaneous patent foramen ovale closure 06/16/2020   s/p PFO occluder device with a a 25 MM AMPLATZER by Dr. Wonda   Stroke [facility])    Swelling    Vitamin D  deficiency    Past Surgical History:  Procedure Laterality Date   BREAST EXCISIONAL BIOPSY Right    cyst   BUBBLE STUDY  04/16/2020   Procedure: BUBBLE STUDY;  Surgeon: Raford Riggs, MD;  Location: [facility] ENDOSCOPY;  Service: Cardiovascular;;   DILATION AND EVACUATION N/A 07/26/2015   Procedure: DILATATION AND EVACUATION;  Surgeon: Carlin DELENA Centers, MD;  Location: [facility] ORS;  Service: Gynecology;  Laterality: N/A;   PATENT FORAMEN OVALE(PFO)  CLOSURE N/A 06/16/2020   Procedure: PATENT FORAMEN OVALE (PFO) CLOSURE;  Surgeon: Wonda Sharper, MD;  Location: [facility] INVASIVE CV LAB;  Service: Cardiovascular;  Laterality: N/A;   TEE WITHOUT CARDIOVERSION N/A 04/16/2020   Procedure: TRANSESOPHAGEAL ECHOCARDIOGRAM (TEE);  Surgeon: Raford Riggs, MD;  Location: [facility] ENDOSCOPY;  Service: Cardiovascular;  Laterality: N/A;   Patient Active Problem List   Diagnosis Date Noted   Acute pain of right knee 03/05/2023   Need for hepatitis C screening test 03/05/2023   Screening for colon cancer 03/05/2023   Need for immunization against influenza 03/05/2023   Minor neurocognitive disorder 08/03/2022   Mild vascular neurocognitive disorder 08/03/2022   Dyslipidemia, goal LDL below 70 07/04/2022   Tobacco abuse 07/04/2022   Mild episode of recurrent major depressive disorder 07/04/2022   Encounter for general adult medical examination with abnormal findings 07/04/2022   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   Other hyperlipidemia 08/16/2021   Class 1 obesity with serious comorbidity and body mass index (BMI) of 33.0 to 33.9 in adult 08/16/2021   Abnormal screening mammogram 08/16/2021   Psychophysiological insomnia 07/08/2020   Chronic migraine without aura without status migrainosus, not intractable 07/08/2020   S/P percutaneous patent foramen ovale closure 06/16/2020   PFO (patent foramen  ovale) - ROPE score 5 05/31/2020   Hypertension    Vitamin D  deficiency 06/07/2017   REFERRING PROVIDER:  Genelle Standing, MD    REFERRING DIAG:  M25.361 (ICD-10-CM) - Patellar instability of right knee    PROCEDURE: 1.  Right knee medial patellofemoral ligament reconstruction 2.  Right knee arthroscopy with medial chondroplasty  Rationale for Evaluation and Treatment: Rehabilitation  THERAPY DIAG:  Acute pain of right knee  Stiffness of right knee, not elsewhere classified  Difficulty in walking, not elsewhere classified  ONSET DATE: DOS  10/18/23   SUBJECTIVE:                                                                                                                                                                                           SUBJECTIVE STATEMENT: Reports she feels like she's at about 80%. Continues to be limited with stairs and getting up/down off floor. Has had a bit of pain recently too.   PERTINENT HISTORY:  Anxiety, stroke  PAIN:  Are you having pain? No   PRECAUTIONS:  None  RED FLAGS: None   WEIGHT BEARING RESTRICTIONS:  WBAT  FALLS:  Has patient fallen in last 6 months? No  LIVING ENVIRONMENT: Stairs to basement- spiral  OCCUPATION:  claims  PLOF:  Independent  PATIENT GOALS:  Walk better, decrease pain, walk for exercise   OBJECTIVE:  Note: Objective measures were completed at Evaluation unless otherwise noted.  PATIENT SURVEYS:  LEFS  Extreme difficulty/unable (0), Quite a bit of difficulty (1), Moderate difficulty (2), Little difficulty (3), No difficulty (4) Survey date:  EVAL 12/19/23 02/12/24  Any of your usual work, housework or school activities 0 4 4  2. Usual hobbies, recreational or sporting activities 0 2 1  3. Getting into/out of the bath 1 4 4   4. Walking between rooms 1 3 4   5. Putting on socks/shoes 1 3 4   6. Squatting  0 4 2  7. Lifting an object, like a bag of groceries from the floor 1 4 4   8. Performing light activities around your home 1 4 4   9. Performing heavy activities around your home 0 3 2  10. Getting into/out of a car 1 3 3   11. Walking 2 blocks 0 3 3  12. Walking 1 mile 0 3 3  13. Going up/down 10 stairs (1 flight) 0 3 3  14. Standing for 1 hour 0 3 3  15.  sitting for 1 hour 2 3 3   16. Running on even ground 0 1 0  17. Running on uneven ground 0 1 0  18. Making sharp turns while  running fast 0 1 0  19. Hopping  0 1 2  20. Rolling over in bed 2 4 4   Score total:  10/80 57/80 53/80      COGNITIVE STATUS: Within functional limits  for tasks assessed   SENSATION: WFL  EDEMA:  Yes: 45 cm Rt, 41 Lt  GAIT: EVAL: arrived with bilat axillary crutches with brace locked in ext, foot placing on floor   Body Part #1 Knee  PALPATION: Eval: good patellar mobility  LOWER EXTREMITY ROM:     Active  Right eval 11/30/23 12/05/23 01/08/24 02/12/24  Knee flexion 60 117 AAROM 111 114* AROM seated  115 seated AROM *  Knee extension 0      Ankle dorsiflexion -4       (Blank rows = not tested)      STRENGTH   01/08/24 R  01/08/24 L  02/12/24 R 02/12/24 L  Hip ABD    39.8 54.8  Hip extension       Knee extension  4 5 43.2 >56  Knee flexion  4 4+ 27.9 44.9                                                                                                                       TREATMENT DATE:  02/12/24 SciFit bike x5 min  Reassessment  Educated on updated POC and exam findings    02/05/24 -NuStep L5: UE/LE x 6 min Rt forward step ups x 10; Rt lateral step ups x 10  -Bil heel raises with heels off of step x 10 -STS with eccentric lowering in staggered stance to standard chair with forward arm reach x 10 - slow and controlled motion -Standing quad stretch holding shoe, 20s x 2 -> trial of seated quad stretch with foot under chair x 20s - Lt forward step down, Rt retro step up 6 with slow and controlled motion x 8 with UE on rail   01/08/24  Scifit bike L4.5 x6 minutes for w/u  ROM and MMT, goals for recert Shuttle LE press double leg 100# 2x12  Shuttle LE press single leg R 50# eccentric lower 2x12  Forward step downs 2 inch box 2x12  STS with eccentric lower from high mat table 2x10     PATIENT EDUCATION:  Education details: Teacher, Music of condition, POC, HEP, exercise form/rationale  Person educated: Patient and Spouse Education method: Explanation, Demonstration, Tactile cues, Verbal cues, and Handouts Education comprehension: verbalized understanding, returned demonstration, verbal cues required, tactile  cues required, and needs further education  HOME EXERCISE PROGRAM:  Access Code: [email or web address] URL: [email or web address] Date: 11/30/2023 Prepared by: Josette Rough  ASSESSMENT:  CLINICAL IMPRESSION: Patient has met 3/3 short term goals and 1/4 long term goals with ability to complete HEP and improvement in symptoms, strength, ROM, activity tolerance, gait, balance, and functional mobility. Remaining goals not met due to continued deficits in RLE strength, lack of eccentric quad control, and neuromuscular deficits. Patient has made good progress toward remaining goals. Continues  to have a lot of difficulty with stair navigation at home, continuing to demonstrate trendelenburg compensation and knee buckling due to lack of quad control. Extending POC 8 weeks to further address remaining deficits and continue to improve to prior level of function.. Patient will continue to benefit from skilled physical therapy in order to improve function and reduce impairment.     REHAB POTENTIAL: Good  CLINICAL DECISION MAKING: Stable/uncomplicated  EVALUATION COMPLEXITY: Low   GOALS: Goals reviewed with patient? Yes  SHORT TERM GOALS: Target date: 8/30  Knee ROM 0-90 comfortably Baseline: Goal status: MET 11/30/23  2.  Sit<>stand from elevated chair without compensatory pattern.  Baseline:  Goal status: MET 12/19/23  3.  Ambulation without AD, proper form Baseline:  Goal status: MET 12/19/23    LONG TERM GOALS: Target date: 04/08/2024  Reciprocal stair navigation with good form and pain <=2/10 Baseline:  Goal status: ONGOING 02/12/24  2.  Strength 80% of contralateral leg via HHD testing Baseline:  Goal status: ONGOING 02/12/24  3.  Return to walking program for regular exercise Baseline:  Goal status: MET 12/19/23  4.  Average knee pain with daily activities <=2/10 Baseline:  Goal status: ONGOING (4/10) 02/12/24  5.  Pt will improve LEFS score by 9 points to indicate  improvement in overall activity tolerance.  Baseline:  Goal status: Initial   PLAN:  PT FREQUENCY: 1-2x/week  PT DURATION: 8 weeks   PLANNED INTERVENTIONS: [id number]- PT Re-evaluation, 97750- Physical Performance Testing, 97110-Therapeutic exercises, 97530- Therapeutic activity, [id number]- Neuromuscular re-education, 97535- Self Care, 02859- Manual therapy, [phone]- Gait training, [phone]- Aquatic Therapy, Patient/Family education, Balance training, Stair training, Taping, Joint mobilization, Spinal mobilization, Scar mobilization, and Cryotherapy.  PLAN FOR NEXT SESSION: Quad strengthening, glute strengthening, work on stair navigation  PROTOCOL: [email or web address]/a  Lili Finder, Student-PT 02/12/2024, 3:17 PM  This entire session was performed under direct supervision and direction of a licensed therapist/therapist assistant . I have personally read, edited and approve of the note as written. 3:22 PM, 02/12/24 Prentice CANDIE Stains PT, DPT Physical Therapist at [facility]

## 2024-02-19 ENCOUNTER — Ambulatory Visit (HOSPITAL_BASED_OUTPATIENT_CLINIC_OR_DEPARTMENT_OTHER): Attending: Orthopaedic Surgery

## 2024-02-19 DIAGNOSIS — R262 Difficulty in walking, not elsewhere classified: Secondary | ICD-10-CM | POA: Insufficient documentation

## 2024-02-19 DIAGNOSIS — M25561 Pain in right knee: Secondary | ICD-10-CM | POA: Insufficient documentation

## 2024-02-19 DIAGNOSIS — M25661 Stiffness of right knee, not elsewhere classified: Secondary | ICD-10-CM | POA: Insufficient documentation

## 2024-02-21 ENCOUNTER — Ambulatory Visit (HOSPITAL_BASED_OUTPATIENT_CLINIC_OR_DEPARTMENT_OTHER): Attending: Orthopaedic Surgery | Admitting: Physical Therapy

## 2024-02-21 ENCOUNTER — Encounter (HOSPITAL_BASED_OUTPATIENT_CLINIC_OR_DEPARTMENT_OTHER): Payer: Self-pay | Admitting: Physical Therapy

## 2024-02-21 DIAGNOSIS — M25561 Pain in right knee: Secondary | ICD-10-CM

## 2024-02-21 DIAGNOSIS — M25661 Stiffness of right knee, not elsewhere classified: Secondary | ICD-10-CM

## 2024-02-21 DIAGNOSIS — R262 Difficulty in walking, not elsewhere classified: Secondary | ICD-10-CM
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
## 2024-02-26 ENCOUNTER — Ambulatory Visit (HOSPITAL_BASED_OUTPATIENT_CLINIC_OR_DEPARTMENT_OTHER)

## 2024-02-26 ENCOUNTER — Encounter (HOSPITAL_BASED_OUTPATIENT_CLINIC_OR_DEPARTMENT_OTHER): Payer: Self-pay

## 2024-02-27 ENCOUNTER — Other Ambulatory Visit (HOSPITAL_BASED_OUTPATIENT_CLINIC_OR_DEPARTMENT_OTHER): Payer: Self-pay

## 2024-02-27 ENCOUNTER — Ambulatory Visit (INDEPENDENT_AMBULATORY_CARE_PROVIDER_SITE_OTHER): Admitting: Orthopaedic Surgery

## 2024-02-27 ENCOUNTER — Ambulatory Visit (INDEPENDENT_AMBULATORY_CARE_PROVIDER_SITE_OTHER)

## 2024-02-27 DIAGNOSIS — M25562 Pain in left knee: Secondary | ICD-10-CM | POA: Diagnosis not present

## 2024-02-27 DIAGNOSIS — M2352 Chronic instability of knee, left knee: Secondary | ICD-10-CM

## 2024-02-27 DIAGNOSIS — G8929 Other chronic pain: Secondary | ICD-10-CM

## 2024-02-27 NOTE — Progress Notes (Signed)
 Post Operative Evaluation    Procedure/Date of Surgery: Right knee MPFL reconstruction 7/31  Interval History:  Presents for follow-up status post above procedure.  She has been having some overuse on this left knee and is experiencing pain in this side   PMH/PSH/Family History/Social History/Meds/Allergies:    Past Medical History:  Diagnosis Date   Anxiety    Bronchitis    Carpal tunnel syndrome on both sides    Constipation    Depression    Family history of adverse reaction to anesthesia    mother had problems waking up from surgery.   GERD (gastroesophageal reflux disease)    Gestational diabetes    Gestational diabetes mellitus    Normal 2 hr GTT postpartum   Headache    migraines   High cholesterol    Hypertension    Infertility, female    Lactose intolerance    Oligohydramnios antepartum 11/09/2017   Palpitations    Panic attack 2017   diagnosed 3 months ago. no meds presently   Reflux    did not filll prescription   S/P percutaneous patent foramen ovale closure 06/16/2020   s/p PFO occluder device with a a 25 MM AMPLATZER by Dr. Wonda   Stroke [facility])    Swelling    Vitamin D  deficiency    Past Surgical History:  Procedure Laterality Date   BREAST EXCISIONAL BIOPSY Right    cyst   BUBBLE STUDY  04/16/2020   Procedure: BUBBLE STUDY;  Surgeon: Raford Riggs, MD;  Location: [facility] ENDOSCOPY;  Service: Cardiovascular;;   DILATION AND EVACUATION N/A 07/26/2015   Procedure: DILATATION AND EVACUATION;  Surgeon: Carlin DELENA Centers, MD;  Location: [facility] ORS;  Service: Gynecology;  Laterality: N/A;   PATENT FORAMEN OVALE(PFO) CLOSURE N/A 06/16/2020   Procedure: PATENT FORAMEN OVALE (PFO) CLOSURE;  Surgeon: Wonda Sharper, MD;  Location: [facility] INVASIVE CV LAB;  Service: Cardiovascular;  Laterality: N/A;   TEE WITHOUT CARDIOVERSION N/A 04/16/2020   Procedure: TRANSESOPHAGEAL ECHOCARDIOGRAM (TEE);  Surgeon: Raford Riggs, MD;   Location: [facility] ENDOSCOPY;  Service: Cardiovascular;  Laterality: N/A;   Social History   Socioeconomic History   Marital status: Married    Spouse name: Not on file   Number of children: Not on file   Years of education: Not on file   Highest education level: Some college, no degree  Occupational History   Occupation: Not Working  Tobacco Use   Smoking status: Every Day    Current packs/day: 0.25    Types: Cigarettes   Smokeless tobacco: Never   Tobacco comments:    4 cig/day  Vaping Use   Vaping status: Never Used  Substance and Sexual Activity   Alcohol use: Yes    Comment: wine occ   Drug use: No   Sexual activity: Yes    Birth control/protection: I.U.D.  Other Topics Concern   Not on file  Social History Narrative   Lives at home with spouse and her 2 children   Right handed   Caffeine: 2 cups/day   Pt works   Social Drivers of Corporate Investment Banker Strain: Low Risk  (07/04/2022)   Overall Financial Resource Strain (CARDIA)    Difficulty of Paying Living Expenses: Not hard at all  Food Insecurity: No Food Insecurity (07/04/2022)   Hunger Vital Sign  Worried About Programme Researcher, Broadcasting/film/video in the Last Year: Never true    Ran Out of Food in the Last Year: Never true  Transportation Needs: No Transportation Needs (07/04/2022)   PRAPARE - Administrator, Civil Service (Medical): No    Lack of Transportation (Non-Medical): No  Physical Activity: Insufficiently Active (07/04/2022)   Exercise Vital Sign    Days of Exercise per Week: 3 days    Minutes of Exercise per Session: 30 min  Stress: Stress Concern Present (07/04/2022)   [employer] of Occupational Health - Occupational Stress Questionnaire    Feeling of Stress : Very much  Social Connections: Moderately Integrated (07/04/2022)   Social Connection and Isolation Panel    Frequency of Communication with Friends and Family: More than three times a week    Frequency of Social Gatherings with  Friends and Family: Twice a week    Attends Religious Services: More than 4 times per year    Active Member of [employer] or Organizations: No    Attends Engineer, Structural: Not on file    Marital Status: Married   Family History  Problem Relation Age of Onset   Hypertension Mother    Stroke Mother    Anxiety disorder Father    Depression Father    Alcoholism Father    Diabetes Maternal Aunt    Diabetes Maternal Uncle    Breast cancer Paternal Aunt    Stroke Maternal Grandmother    No Known Allergies Current Outpatient Medications  Medication Sig Dispense Refill   aspirin  81 MG EC tablet Take 81 mg by mouth daily. Swallow whole.     aspirin  EC 325 MG tablet Take 1 tablet (325 mg total) by mouth daily. 14 tablet 0   brompheniramine-pseudoephedrine-DM 30-2-10 MG/5ML syrup Take 5 mLs by mouth 4 (four) times daily as needed. 240 mL 0   buPROPion  (WELLBUTRIN  XL) 150 MG 24 hr tablet Take 1 tablet (150 mg total) by mouth daily. 90 tablet 0   levonorgestrel  (MIRENA , 52 MG,) 20 MCG/24HR IUD 1 Intra Uterine Device (1 each total) by Intrauterine route once for 1 dose. 1 each 0   lisinopril  (ZESTRIL ) 20 MG tablet Take 1.5 tablets (30 mg total) by mouth daily. 135 tablet 2   meloxicam  (MOBIC ) 15 MG tablet Take 1 tablet (15 mg total) by mouth daily. 30 tablet 0   Multiple Vitamins-Minerals (MULTIVITAMIN WITH MINERALS) tablet Take 1 tablet by mouth daily.     ondansetron  (ZOFRAN ) 4 MG tablet Take 1 tablet (4 mg total) by mouth every 8 (eight) hours as needed for nausea or vomiting. 20 tablet 0   oxyCODONE  (ROXICODONE ) 5 MG immediate release tablet Take 1 tablet (5 mg total) by mouth every 4 (four) hours as needed for severe pain (pain score 7-10) or breakthrough pain. 10 tablet 0   rosuvastatin  (CRESTOR ) 10 MG tablet Take 1 tablet (10 mg total) by mouth daily. 90 tablet 2   sertraline  (ZOLOFT ) 50 MG tablet Take 1 tablet (50 mg total) by mouth daily. 30 tablet 0   traZODone  (DESYREL ) 50 MG  tablet Take 1-1.5 tablets (50-75 mg total) by mouth at bedtime. 45 tablet 11   varenicline  (CHANTIX ) 0.5 MG tablet Take 1 tablet (0.5 mg total) by mouth 2 (two) times daily. 12 tablet 0   varenicline  (CHANTIX ) 1 MG tablet Take 1 tablet (1 mg total) by mouth 2 (two) times daily. 168 tablet 0   Vitamin D , Ergocalciferol , (DRISDOL ) 1.25 MG (50000  UNIT) CAPS capsule Take 1 capsule (50,000 Units total) by mouth every 7 (seven) days. 12 capsule 3   No current facility-administered medications for this visit.   No results found.  Review of Systems:   A ROS was performed including pertinent positives and negatives as documented in the HPI.   Musculoskeletal Exam:    There were no vitals taken for this visit.  Right knee well-appearing incisions are clean dry intact.  Range of motion is from 0 to 45 degrees.  There is no effusion.  No joint line tenderness  Imaging:      I personally reviewed and interpreted the radiographs.   Assessment:   Status post right knee MPFL reconstruction overall doing well.  I do believe that there is some left knee component of overcompensation and to this effect I have recommended ultrasound-guided injection which she would like to proceed with  Plan :    - Left knee ultrasound-guided injection provided after verbal consent obtained      I personally saw and evaluated the patient, and participated in the management and treatment plan.  Elspeth Parker, MD Attending Physician, Orthopedic Surgery  This document was dictated using Dragon voice recognition software. A reasonable attempt at proof reading has been made to minimize errors.

## 2024-02-28 ENCOUNTER — Telehealth (HOSPITAL_BASED_OUTPATIENT_CLINIC_OR_DEPARTMENT_OTHER): Payer: Self-pay | Admitting: Physical Therapy

## 2024-02-28 ENCOUNTER — Ambulatory Visit (HOSPITAL_BASED_OUTPATIENT_CLINIC_OR_DEPARTMENT_OTHER): Admitting: Physical Therapy

## 2024-02-28 NOTE — Telephone Encounter (Signed)
 Patient no show, unable to leave message for patient as voicemail was full.   1:47 PM, 02/28/2024 Prentice CANDIE Stains PT, DPT Physical Therapist at [facility]

## 2024-03-04 ENCOUNTER — Ambulatory Visit (HOSPITAL_BASED_OUTPATIENT_CLINIC_OR_DEPARTMENT_OTHER): Admitting: Physical Therapy

## 2024-03-06 ENCOUNTER — Ambulatory Visit (HOSPITAL_BASED_OUTPATIENT_CLINIC_OR_DEPARTMENT_OTHER): Admitting: Physical Therapy

## 2024-03-07 ENCOUNTER — Telehealth: Admitting: Emergency Medicine

## 2024-03-07 DIAGNOSIS — R6889 Other general symptoms and signs: Secondary | ICD-10-CM

## 2024-03-07 MED ORDER — OSELTAMIVIR PHOSPHATE 75 MG PO CAPS: 75.0000 mg | ORAL_CAPSULE | Freq: Two times a day (BID) | ORAL | 0 refills | Status: AC

## 2024-03-07 MED ORDER — BENZONATATE 100 MG PO CAPS: 100.0000 mg | ORAL_CAPSULE | Freq: Two times a day (BID) | ORAL | 0 refills | Status: AC | PRN

## 2024-03-07 NOTE — Progress Notes (Signed)
 Message sent to patient requesting further input regarding current symptoms. Awaiting patient response.

## 2024-03-07 NOTE — Progress Notes (Signed)
 E visit for Flu like symptoms   We are sorry that you are not feeling well.  Here is how we plan to help! Based on what you have shared with me it looks like you may have a respiratory virus that may be influenza.  Influenza or the flu is  an infection caused by a respiratory virus. The flu virus is highly contagious and persons who did not receive their yearly flu vaccination may catch the flu from close contact.  We have anti-viral medications to treat the viruses that cause this infection. They are not a cure and only shorten the course of the infection. These prescriptions are most effective when they are given within the first 2 days of flu symptoms. Antiviral medications are indicated if you have a high risk of complications from the flu. You should  also consider an antiviral medication if you are in close contact with someone who is at risk. These medications can help patients avoid complications from the flu but have side effects that you should know.   Possible side effects from Tamiflu or oseltamivir include nausea, vomiting, diarrhea, dizziness, headaches, eye redness, sleep problems or other respiratory symptoms. You should not take Tamiflu if you have an allergy to oseltamivir or any to the ingredients in Tamiflu.  Based upon your symptoms and potential risk factors I have prescribed Oseltamivir (Tamiflu).  It has been sent to your designated pharmacy.  You will take one 75 mg capsule orally twice a day for the next 5 days.   For nasal congestion, you may use an oral decongestant such as Mucinex D or if you have glaucoma or high blood pressure use plain Mucinex.  Saline nasal spray or nasal drops can help and can safely be used as often as needed for congestion.  If you have a sore or scratchy throat, use a saltwater gargle-  to  teaspoon of salt dissolved in a 4-ounce to 8-ounce glass of warm water.  Gargle the solution for approximately 15-30 seconds and then spit.  It is  important not to swallow the solution.  You can also use throat lozenges/cough drops and Chloraseptic spray to help with throat pain or discomfort.  Warm or cold liquids can also be helpful in relieving throat pain.  For headache, pain or general discomfort, you can use Ibuprofen  or Tylenol  as directed.   Some authorities believe that zinc sprays or the use of Echinacea may shorten the course of your symptoms.  I have prescribed the following medications to help lessen symptoms: I have prescribed Tessalon  Perles 100 mg. You may take 1-2 capsules every 8 hours as needed for cough  You are to isolate at home until you have been fever-free for at least 24 hours without a fever-reducing medication, and symptoms have been steadily improving for 24 hours.  If you must be around other household members who do not have symptoms, you need to make sure that both you and the family members are masking consistently with a high-quality mask.  If you note any worsening of symptoms despite treatment, please seek an in-person evaluation ASAP. If you note any significant shortness of breath or any chest pain, please seek ED evaluation. Please do not delay care!  ANYONE WHO HAS FLU SYMPTOMS SHOULD: Stay home. The flu is highly contagious and going out or to work exposes others! Be sure to drink plenty of fluids. Water is fine as well as fruit juices, sodas and electrolyte beverages. You may want to stay  away from caffeine or alcohol. If you are nauseated, try taking small sips of liquids. How do you know if you are getting enough fluid? Your urine should be a pale yellow or almost colorless. Get rest. Taking a steamy shower or using a humidifier may help nasal congestion and ease sore throat pain. Using a saline nasal spray works much the same way. Cough drops, hard candies and sore throat lozenges may ease your cough. Line up a caregiver. Have someone check on you regularly.  GET HELP RIGHT AWAY IF: You cannot  keep down liquids or your medications. You become short of breath Your fell like you are going to pass out or loose consciousness. Your symptoms persist after you have completed your treatment plan  MAKE SURE YOU  Understand these instructions. Will watch your condition. Will get help right away if you are not doing well or get worse.  Your e-visit answers were reviewed by a board certified advanced clinical practitioner to complete your personal care plan.  Depending on the condition, your plan could have included both over the counter or prescription medications.  If there is a problem please reply  once you have received a response from your provider.  Your safety is important to us .  If you have drug allergies check your prescription carefully.    You can use MyChart to ask questions about todays visit, request a non-urgent call back, or ask for a work or school excuse for 24 hours related to this e-Visit. If it has been greater than 24 hours you will need to follow up with your provider, or enter a new e-Visit to address those concerns.  You will get an e-mail in the next two days asking about your experience.  I hope that your e-visit has been valuable and will speed your recovery. Thank you for using e-visits.   I have spent 5 minutes in review of e-visit questionnaire, review and updating patient chart, medical decision making and response to patient.   Lamar Schlossman, PA-C

## 2024-03-11 ENCOUNTER — Telehealth (HOSPITAL_BASED_OUTPATIENT_CLINIC_OR_DEPARTMENT_OTHER): Payer: Self-pay

## 2024-03-11 ENCOUNTER — Encounter (HOSPITAL_BASED_OUTPATIENT_CLINIC_OR_DEPARTMENT_OTHER)

## 2024-03-11 NOTE — Telephone Encounter (Signed)
 Called pt and left VM regarding no show visit today. Reminded pt of cx policy and that she will only be able to schedule 1 visit at a time from now on.
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
## 2024-03-28 ENCOUNTER — Ambulatory Visit (INDEPENDENT_AMBULATORY_CARE_PROVIDER_SITE_OTHER): Admitting: Family

## 2024-03-28 ENCOUNTER — Encounter (HOSPITAL_BASED_OUTPATIENT_CLINIC_OR_DEPARTMENT_OTHER): Payer: Self-pay | Admitting: Family

## 2024-03-28 VITALS — BP 124/72 | HR 68 | Ht 66.0 in | Wt 219.0 lb

## 2024-03-28 DIAGNOSIS — Z8673 Personal history of transient ischemic attack (TIA), and cerebral infarction without residual deficits: Secondary | ICD-10-CM

## 2024-03-28 DIAGNOSIS — I10 Essential (primary) hypertension: Secondary | ICD-10-CM

## 2024-03-28 DIAGNOSIS — Z720 Tobacco use: Secondary | ICD-10-CM | POA: Diagnosis not present

## 2024-03-28 DIAGNOSIS — E785 Hyperlipidemia, unspecified: Secondary | ICD-10-CM | POA: Diagnosis not present

## 2024-03-28 NOTE — Progress Notes (Unsigned)
 " Cardiology Office Note   Date:  03/28/2024  ID:  TYRICA AFZAL, DOB 1976/02/14, MRN [id number] PCP: Joshua Debby CROME, MD  [facility] Providers Cardiologist:  Annabella Scarce, MD     History of Present Illness Tracey [name] is a 49 y.o. female with a hx of hypertension, stroke, prior tobacco use, PFO s/p PFO closure.   Diagnosed with hypertension at the birth of her youngest child in 2020 and started on lisinopril .  Presented to the hospital 03/2020 with acute stroke with neck discomfort and left-sided numbness and weakness.  Found to have small acute infarct in right posterior frontal lobe which is thought to be embolic.  LE Dopplers negative for DVT.  Monitor with no atrial fibrillation.  She followed with Dr. Wonda and had 25 mm Amplatzer occluder device implanted 06/16/2020.  Postprocedural echo stable with no residual shunting. Echo 06/15/2021 LVEF 55-60%, no evidence of shunting.  At visit 10/01/2023 Varenicline  initiated for smoking cessation.  Due to history of CVA and HLD coronary calcium  score ordered.  She did not have this performed.  She had a normal LP(a) 11/2023.  Labs 12/14/23 TC 107, TG 59, HDL 34, LDL 60.  Presents today for follow-up. Plans to schedule her calcium  score. Reports no shortness of breath nor dyspnea on exertion. Reports no chest [name], pressure, or tightness. No edema, orthopnea, PND. Reports no palpitations.  She has started exercising since her last visit exercising every other day at the gym. She uses treadmill, bike, assisted weight machines. She is interested in GLP1 due to history of CVA and obesity.   ROS: Please see the history of present illness.    All other systems reviewed and are negative.   Studies Reviewed      Cardiac Studies & Procedures   ______________________________________________________________________________________________ CARDIAC CATHETERIZATION  CARDIAC CATHETERIZATION 06/16/2020  Conclusion SUCCESSFUL TRANSCATHETER PFO  CLOSURE USING INTRACARDIAC ECHO AND FLUOROSCOPIC GUIDANCE. DEFECT CLOSED WITH A 25 MM AMPLATZER PFO OCCLUDER DEVICE  RECOMMEND:  ASA/CLOPIDOGREL  X 6 MONTHS  SBE PROPHYLAXIS X 6 MONTHS  LIMITED 2D ECHO POST-PROCEDURE ASSESSMENT     ECHOCARDIOGRAM  ECHOCARDIOGRAM LIMITED BUBBLE STUDY 06/15/2021  Narrative ECHOCARDIOGRAM LIMITED REPORT    Patient Name:   Tracey [name]  Date of Exam: 06/15/2021 Medical Rec #:  [id number]     Height:       67.0 in Accession #:    [id number]    Weight:       206.0 lb Date of Birth:  Aug 08, 1975    BSA:          2.048 m Patient Age:    45 years      BP:           139/94 mmHg Patient Gender: F             HR:           65 bpm. Exam Location:  [address]  Procedure: Limited Echo, Cardiac Doppler, Limited Color Doppler and Strain Analysis  MODIFIED REPORT: This report was modified by Kardie Tobb DO on 06/16/2021 due to reviewed for shunt, changed no shunt seen. Indications:     Z87.74 PFO  History:         Patient has prior history of Echocardiogram examinations, most recent 06/16/2020. Stroke; Risk Factors:Hypertension.  Sonographer:     Waldo Guadalajara RCS Referring Phys:  [id number] KATHRYN R THOMPSON Diagnosing Phys: Kardie Tobb DO  IMPRESSIONS   1. Left ventricular ejection fraction, by estimation, is 55  to 60%. The left ventricle has normal function. There is mild asymmetric left ventricular hypertrophy of the inferior segment. Left ventricular diastolic function could not be evaluated. 2. The mitral valve is normal in structure. Trivial mitral valve regurgitation. No evidence of mitral stenosis. 3. The aortic valve is tricuspid. Aortic valve regurgitation is not visualized. No aortic stenosis is present. 4. Agitated saline contrast bubble study was negative, with no evidence of any interatrial shunt.  FINDINGS Left Ventricle: Left ventricular ejection fraction, by estimation, is 55 to 60%. The left ventricle has normal function. There is  mild asymmetric left ventricular hypertrophy of the inferior segment. Left ventricular diastolic function could not be evaluated.  Pericardium: Presence of epicardial fat layer.  Mitral Valve: The mitral valve is normal in structure. Trivial mitral valve regurgitation. No evidence of mitral valve stenosis.  Aortic Valve: The aortic valve is tricuspid. Aortic valve regurgitation is not visualized. No aortic stenosis is present.  Pulmonic Valve: The pulmonic valve was normal in structure. Pulmonic valve regurgitation is not visualized. No evidence of pulmonic stenosis.  IAS/Shunts: No atrial level shunt detected by color flow Doppler. Agitated saline contrast bubble study was negative, with no evidence of any interatrial shunt.  LEFT VENTRICLE PLAX 2D LVIDd:         4.60 cm LVIDs:         3.30 cm LV PW:         1.20 cm LV IVS:        0.80 cm LVOT diam:     1.80 cm LVOT Area:     2.54 cm   LEFT ATRIUM         Index LA diam:    3.60 cm 1.76 cm/m  AORTA Ao Root diam: 2.80 cm  MITRAL VALVE MV Area (PHT): 3.89 cm    SHUNTS MV Decel Time: 195 msec    Systemic Diam: 1.80 cm MV E velocity: 82.80 cm/s MV A velocity: 73.90 cm/s MV E/A ratio:  1.12  Kardie Tobb DO Electronically signed by Dub Huntsman DO Signature Date/Time: 06/15/2021/3:42:41 PM    Final (Updated)   TEE  ECHO TEE 04/16/2020  Narrative TRANSESOPHOGEAL ECHO REPORT    Patient Name:   Tracey [name] Date of Exam: 04/16/2020 Medical Rec #:  [id number]    Height:       67.0 in Accession #:    [id number]   Weight:       215.0 lb Date of Birth:  [birth year]   BSA:          2.085 m Patient Age:    44 years     BP:           140/93 mmHg Patient Gender: F            HR:           89 bpm. Exam Location:  Outpatient  Procedure: Transesophageal Echo, Cardiac Doppler, Color Doppler and Saline Contrast Bubble Study  Indications:     CVA  History:         Patient has prior history of Echocardiogram examinations,  most recent 04/03/2020. Stroke; Risk Factors:Hypertension and Former Smoker. PFO.  Sonographer:     Lyle Marc Referring Phys:  [id number] POWELL BRAVO PEMBERTON Diagnosing Phys: Annabella Scarce MD  PROCEDURE: After discussion of the risks and benefits of a TEE, an informed consent was obtained from the patient. The transesophogeal probe was passed without difficulty through the esophogus of the patient. Sedation performed by different physician.  The patient was monitored while under deep sedation. Anesthestetic sedation was provided intravenously by Anesthesiology: 320.63mg  of Propofol . Image quality was excellent. The patient's vital signs; including heart rate, blood pressure, and oxygen saturation; remained stable throughout the procedure. The patient developed no complications during the procedure.  IMPRESSIONS   1. Left ventricular ejection fraction, by estimation, is 60 to 65%. The left ventricle has normal function. The left ventricle has no regional wall motion abnormalities. 2. Right ventricular systolic function is normal. The right ventricular size is normal. 3. No left atrial/left atrial appendage thrombus was detected. The LAA emptying velocity was 71 cm/s. 4. The mitral valve is normal in structure. Trivial mitral valve regurgitation. No evidence of mitral stenosis. 5. The aortic valve is tricuspid. Aortic valve regurgitation is not visualized. No aortic stenosis is present. 6. Evidence of atrial level shunting detected by color flow Doppler. Agitated saline contrast bubble study was positive with shunting observed within 3-6 cardiac cycles suggestive of interatrial shunt. There is a small patent foramen ovale with predominantly right to left shunting across the atrial septum.  Conclusion(s)/Recommendation(s): Normal biventricular function without evidence of hemodynamically significant valvular heart disease.  FINDINGS Left Ventricle: Left ventricular ejection fraction, by  estimation, is 60 to 65%. The left ventricle has normal function. The left ventricle has no regional wall motion abnormalities. The left ventricular internal cavity size was normal in size. There is no left ventricular hypertrophy.  Right Ventricle: The right ventricular size is normal. No increase in right ventricular wall thickness. Right ventricular systolic function is normal.  Left Atrium: Left atrial size was normal in size. No left atrial/left atrial appendage thrombus was detected. The LAA emptying velocity was 71 cm/s.  Right Atrium: Right atrial size was normal in size.  Pericardium: There is no evidence of pericardial effusion.  Mitral Valve: The mitral valve is normal in structure. Trivial mitral valve regurgitation. No evidence of mitral valve stenosis.  Tricuspid Valve: The tricuspid valve is normal in structure. Tricuspid valve regurgitation is trivial. No evidence of tricuspid stenosis.  Aortic Valve: The aortic valve is tricuspid. Aortic valve regurgitation is not visualized. No aortic stenosis is present.  Pulmonic Valve: The pulmonic valve was normal in structure. Pulmonic valve regurgitation is trivial. No evidence of pulmonic stenosis.  Aorta: The aortic root is normal in size and structure.  IAS/Shunts: Evidence of atrial level shunting detected by color flow Doppler. Agitated saline contrast was given intravenously to evaluate for intracardiac shunting. Agitated saline contrast bubble study was positive with shunting observed within 3-6 cardiac cycles suggestive of interatrial shunt. A small patent foramen ovale is detected with predominantly right to left shunting across the atrial septum.  Annabella Scarce MD Electronically signed by Annabella Scarce MD Signature Date/Time: 04/16/2020/2:20:28 PM    Final  MONITORS  CARDIAC EVENT MONITOR 05/19/2020  Narrative  Patient's monitoring period was from 04/16/20-05/15/20  Predominant rhythm was NSR with average  HR 69bpm ranging from 45bpm to 127bpm.  There were rare SVEs (<1%), occasional PVCs (1%, 16977)  No afib, significant pauses or VT  Overall, no significant arrhythmias detected on the monitor  Powell Sorrow, MD       ______________________________________________________________________________________________        Risk Assessment/Calculations          Physical Exam VS:  BP 124/72 (BP Location: Right Arm, Patient Position: Sitting, Cuff Size: Large)   Pulse 68   Ht 5' 6 (1.676 m)   Wt 219 lb (99.3 kg)   SpO2  99%   BMI 35.35 kg/m        Wt Readings from Last 3 Encounters:  03/28/24 219 lb (99.3 kg)  10/01/23 212 lb (96.2 kg)  08/23/23 212 lb (96.2 kg)    GEN: Well nourished, well developed in no acute distress NECK: No JVD; No carotid bruits CARDIAC: RRR, no murmurs, rubs, gallops RESPIRATORY:  Clear to auscultation without rales, wheezing or rhonchi  ABDOMEN: Soft, non-tender, non-distended EXTREMITIES:  No edema; No deformity   ASSESSMENT AND PLAN  HTN- BP well controlled. Continue current antihypertensive regimen Lisinopril  30mg  QD.   PFO s/p PFO closure-performed 05/2020 by Dr. Wonda.  Echo 05/2021 with normal LVEF and no shunting.   HLD, HLD, LDL goal <70 - Continue Rosuvastatin  10mg  daily. 11/2023 Lp(a) unremarkable. 12/14/23 LDL 60.   Cryptogenic CVA-continue aspirin  81mg  daily and Rosuvastatin  10mg  daily.  Due to history of CVA, HLD plan for coronary calcium  score.  Due to hx of CVA and cardiovascular risk factors, will pursue coverage for GLP1 Zepbound.  Tobacco use - Rx Varnecline for smoking cessation. Refer to virtual smoking cessation team.  Depression - Continue to follow with psychiatry..          Dispo: follow up in 4 months   Signed, Reche GORMAN Finder, NP   "

## 2024-03-28 NOTE — Patient Instructions (Signed)
 Medication Instructions:  Continue your current medications.   We have sent a prior authorization for GLP1 for weight loss, we will send you a MyChart message to let you know if it is covered cost.   *If you need a refill on your cardiac medications before your next appointment, please call your pharmacy*  Testing/Procedures: Your provider has recommended a coronary calcium  score.   Follow-Up: At [facility], you and your health needs are our priority.  As part of our continuing mission to provide you with exceptional heart care, our providers are all part of one team.  This team includes your primary Cardiologist (physician) and Advanced Practice Providers or APPs (Physician Assistants and Nurse Practitioners) who all work together to provide you with the care you need, when you need it.  Your next appointment:   4 month(s)  Provider:   Annabella Scarce, MD or Reche Finder, NP    We recommend signing up for the patient portal called MyChart.  Sign up information is provided on this After Visit Summary.  MyChart is used to connect with patients for Virtual Visits (Telemedicine).  Patients are able to view lab/test results, encounter notes, upcoming appointments, etc.  Non-urgent messages can be sent to your provider as well.   To learn more about what you can do with MyChart, go to [email or web address].   Other Instructions

## 2024-03-31 ENCOUNTER — Other Ambulatory Visit (HOSPITAL_COMMUNITY): Payer: Self-pay

## 2024-03-31 ENCOUNTER — Telehealth: Payer: Self-pay | Admitting: Pharmacy Technician

## 2024-03-31 ENCOUNTER — Encounter (HOSPITAL_BASED_OUTPATIENT_CLINIC_OR_DEPARTMENT_OTHER): Payer: Self-pay

## 2024-03-31 NOTE — Telephone Encounter (Signed)
 Pharmacy Patient Advocate Encounter   Received notification from Physician's Office that prior authorization for zepbound is required/requested.   Insurance verification completed.   The patient is insured through [facility] ADVANTAGE/RX ADVANCE.   Per test claim: PA required; PA submitted to above mentioned insurance via Latent Key/confirmation #/EOC [id number] Status is pending

## 2024-03-31 NOTE — Telephone Encounter (Signed)
 Waiting on answer from office:

## 2024-04-01 ENCOUNTER — Other Ambulatory Visit (HOSPITAL_COMMUNITY): Payer: Self-pay

## 2024-04-01 ENCOUNTER — Other Ambulatory Visit: Payer: Self-pay

## 2024-04-01 ENCOUNTER — Telehealth: Payer: Self-pay | Admitting: Pharmacy Technician

## 2024-04-01 ENCOUNTER — Other Ambulatory Visit (HOSPITAL_BASED_OUTPATIENT_CLINIC_OR_DEPARTMENT_OTHER): Payer: Self-pay

## 2024-04-01 MED ORDER — SEMAGLUTIDE-WEIGHT MANAGEMENT 2.4 MG/0.75ML ~~LOC~~ SOAJ
2.4000 mg | SUBCUTANEOUS | 0 refills | Status: AC
  Filled 2024-04-01: qty 3, 28d supply, fill #0

## 2024-04-01 MED ORDER — SEMAGLUTIDE-WEIGHT MANAGEMENT 1.7 MG/0.75ML ~~LOC~~ SOAJ
1.7000 mg | SUBCUTANEOUS | 0 refills | Status: AC
  Filled 2024-04-01: qty 3, 28d supply, fill #0

## 2024-04-01 MED ORDER — SEMAGLUTIDE-WEIGHT MANAGEMENT 0.25 MG/0.5ML ~~LOC~~ SOAJ
0.2500 mg | SUBCUTANEOUS | 0 refills | Status: AC
  Filled 2024-04-01: qty 2, 28d supply, fill #0

## 2024-04-01 MED ORDER — SEMAGLUTIDE-WEIGHT MANAGEMENT 0.5 MG/0.5ML ~~LOC~~ SOAJ
0.5000 mg | SUBCUTANEOUS | 0 refills | Status: AC
  Filled 2024-04-01: qty 2, 28d supply, fill #0

## 2024-04-01 MED ORDER — SEMAGLUTIDE-WEIGHT MANAGEMENT 1 MG/0.5ML ~~LOC~~ SOAJ
1.0000 mg | SUBCUTANEOUS | 0 refills | Status: AC
  Filled 2024-04-01: qty 2, 28d supply, fill #0

## 2024-04-01 NOTE — Telephone Encounter (Signed)
 Pharmacy Patient Advocate Encounter  Received notification from [employer] that Prior Authorization for Wegovy  has been APPROVED from 04/01/24 to 10/30/24. Ran test claim, Copay is $384.42- one month. This test claim was processed through [facility]- copay amounts may vary at other pharmacies due to pharmacy/plan contracts, or as the patient moves through the different stages of their insurance plan.   PA #/Case ID/Reference #: [id number]

## 2024-04-01 NOTE — Telephone Encounter (Signed)
 Changing to wegovy  to try for PA

## 2024-04-01 NOTE — Telephone Encounter (Signed)
 Pharmacy Patient Advocate Encounter   Received notification from Physician's Office - caitlin w that prior authorization for Wegovy  0.25 Mg is required/requested.   Insurance verification completed.   The patient is insured through [facility] ADVANTAGE/RX ADVANCE.   Per test claim: PA required; PA submitted to above mentioned insurance via Latent Key/confirmation #/EOC [id number] Status is pending

## 2024-04-02 ENCOUNTER — Other Ambulatory Visit (HOSPITAL_BASED_OUTPATIENT_CLINIC_OR_DEPARTMENT_OTHER): Payer: Self-pay

## 2024-04-14 ENCOUNTER — Encounter: Admitting: Obstetrics and Gynecology

## 2024-04-17 ENCOUNTER — Ambulatory Visit: Admitting: Internal Medicine

## 2024-06-13 ENCOUNTER — Encounter: Admitting: Obstetrics and Gynecology

## 2024-08-01 ENCOUNTER — Ambulatory Visit (HOSPITAL_BASED_OUTPATIENT_CLINIC_OR_DEPARTMENT_OTHER): Admitting: Family
